# Patient Record
Sex: Female | Born: 1935 | Race: White | Hispanic: No | Smoking: Never smoker
Health system: Southern US, Community
[De-identification: ages and names within clinical notes are randomized; demographics above are authoritative.]

## PROBLEM LIST (undated history)

## (undated) DIAGNOSIS — M1990 Unspecified osteoarthritis, unspecified site: Secondary | ICD-10-CM

## (undated) DIAGNOSIS — E039 Hypothyroidism, unspecified: Secondary | ICD-10-CM

## (undated) DIAGNOSIS — N189 Chronic kidney disease, unspecified: Secondary | ICD-10-CM

## (undated) DIAGNOSIS — J189 Pneumonia, unspecified organism: Secondary | ICD-10-CM

## (undated) DIAGNOSIS — R112 Nausea with vomiting, unspecified: Secondary | ICD-10-CM

## (undated) DIAGNOSIS — E7800 Pure hypercholesterolemia, unspecified: Secondary | ICD-10-CM

## (undated) DIAGNOSIS — C801 Malignant (primary) neoplasm, unspecified: Secondary | ICD-10-CM

## (undated) DIAGNOSIS — I82409 Acute embolism and thrombosis of unspecified deep veins of unspecified lower extremity: Secondary | ICD-10-CM

## (undated) DIAGNOSIS — I10 Essential (primary) hypertension: Secondary | ICD-10-CM

## (undated) DIAGNOSIS — I739 Peripheral vascular disease, unspecified: Secondary | ICD-10-CM

## (undated) DIAGNOSIS — Z8489 Family history of other specified conditions: Secondary | ICD-10-CM

## (undated) DIAGNOSIS — C50911 Malignant neoplasm of unspecified site of right female breast: Secondary | ICD-10-CM

## (undated) DIAGNOSIS — R0602 Shortness of breath: Secondary | ICD-10-CM

## (undated) DIAGNOSIS — H3530 Unspecified macular degeneration: Secondary | ICD-10-CM

## (undated) DIAGNOSIS — Z9889 Other specified postprocedural states: Secondary | ICD-10-CM

## (undated) HISTORY — PX: FRACTURE SURGERY: SHX138

## (undated) HISTORY — PX: CYSTOSCOPY W/ LITHOLAPAXY / EHL: SUR377

## (undated) HISTORY — PX: WRIST FRACTURE SURGERY: SHX121

## (undated) HISTORY — PX: MASTECTOMY COMPLETE / SIMPLE: SUR845

## (undated) HISTORY — PX: FEMORAL ENDARTERECTOMY: SUR606

## (undated) HISTORY — PX: BREAST BIOPSY: SHX20

---

## 1987-10-11 HISTORY — PX: MASTECTOMY: SHX3

## 2010-01-10 ENCOUNTER — Emergency Department (HOSPITAL_COMMUNITY): Admission: EM | Admit: 2010-01-10 | Discharge: 2010-01-10 | Payer: Self-pay | Admitting: Family Medicine

## 2010-03-27 ENCOUNTER — Emergency Department: Payer: Self-pay | Admitting: Unknown Physician Specialty

## 2010-11-10 ENCOUNTER — Ambulatory Visit (HOSPITAL_COMMUNITY)
Admission: RE | Admit: 2010-11-10 | Discharge: 2010-11-10 | Disposition: A | Discharge status: Home / Self Care | Payer: No Typology Code available for payment source | Source: Ambulatory Visit | Attending: Urology | Admitting: Urology

## 2010-11-10 DIAGNOSIS — R509 Fever, unspecified: Secondary | ICD-10-CM | POA: Insufficient documentation

## 2010-11-10 DIAGNOSIS — N201 Calculus of ureter: Secondary | ICD-10-CM | POA: Insufficient documentation

## 2010-11-10 DIAGNOSIS — I10 Essential (primary) hypertension: Secondary | ICD-10-CM | POA: Insufficient documentation

## 2010-11-10 DIAGNOSIS — Z853 Personal history of malignant neoplasm of breast: Secondary | ICD-10-CM | POA: Insufficient documentation

## 2010-11-10 DIAGNOSIS — Z01810 Encounter for preprocedural cardiovascular examination: Secondary | ICD-10-CM | POA: Insufficient documentation

## 2010-11-10 DIAGNOSIS — E7800 Pure hypercholesterolemia, unspecified: Secondary | ICD-10-CM | POA: Insufficient documentation

## 2010-11-10 HISTORY — PX: CYSTOSCOPY W/ URETERAL STENT PLACEMENT: SHX1429

## 2010-11-10 LAB — SURGICAL PCR SCREEN: Staphylococcus aureus: NEGATIVE

## 2010-11-10 LAB — COMPREHENSIVE METABOLIC PANEL
CO2: 26 mEq/L (ref 19–32)
Calcium: 9.3 mg/dL (ref 8.4–10.5)
Creatinine, Ser: 1.06 mg/dL (ref 0.4–1.2)
GFR calc non Af Amer: 49 mL/min — ABNORMAL LOW (ref >60)
Glucose, Bld: 130 mg/dL — ABNORMAL HIGH (ref 70–99)

## 2010-11-10 LAB — CBC
HCT: 36.9 % (ref 36.0–46.0)
Hemoglobin: 12.4 g/dL (ref 12.0–15.0)
MCH: 30.8 pg (ref 26.0–34.0)
MCHC: 33.6 g/dL (ref 30.0–36.0)

## 2010-11-11 LAB — URINE CULTURE: Special Requests: NEGATIVE

## 2010-11-13 NOTE — Op Note (Signed)
  [name], [name]             ACCOUNT NO.:  [id number]  MEDICAL RECORD NO.:  [id number]           PATIENT TYPE:  O  LOCATION:  DAYL                         FACILITY:  [facility] [place]  PHYSICIAN:  Heloise Purpura, MD      DATE OF BIRTH:  Feb 06, 1919  DATE OF PROCEDURE:  11/10/2010 DATE OF DISCHARGE:                              OPERATIVE REPORT   PREOPERATIVE DIAGNOSIS: 1. Left ureteral stone. 2. Fever.  POSTOPERATIVE DIAGNOSIS: 1. Left ureteral stone. 2. Fever.  PROCEDURE: 1. Cystoscopy. 2. Left retrograde pyelography with interpretation. 3. Left ureteral stent placement (6 x 24).  SURGEON:  Heloise Purpura, MD  ANESTHESIA:  MAC.  INTRAOPERATIVE FINDINGS:  Retrograde pyelography demonstrated a filling defect within the distal left ureter consistent with the patient's stone and indicating that it had migrated distally compared to her prior CT scan.  No other abnormalities or filling defects were noted within the ureter.  INDICATIONS FOR PROCEDURE:  [name] is an 77 year old female who was found to have a distal left ureteral calculus and was evaluated by Dr. Annabell Howells approximately two days ago.  She had persistent pain and a fever to 100.8 degrees Fahrenheit and presented today for further evaluation.  Based on her continued pain as well as the fact that she did have a fever indicating possible infection, it was felt that she should proceed with cystoscopy and left ureteral stent placement.  The potential risks, complications and alternative treatment options were discussed in detail, and informed consent was obtained.  DESCRIPTION OF PROCEDURE:  The patient was taken to the operating room, and intravenous sedation was administered.  She was given preoperative antibiotics, placed in the dorsal lithotomy position and prepped and draped in the usual sterile fashion.  Next, a preoperative timeout was performed.  Cystourethroscopy was then performed which revealed no evidence of  any bladder tumors, stones or other mucosal pathology upon systematic examination of bladder.  The ureteral orifices were in the normal anatomic position, and the left ureteral orifice was then identified and cannulated with a 6-French ureteral catheter.  Omnipaque contrast was then gently injected, and there was a filling defect in the distal third of the ureter consistent with the patient's known stone. There was dilation above this area but no evidence of any other ureteral filling defects.  A 0.038 sensor guidewire was then advanced past the obstructing stone and into the renal pelvis without difficulty.  The ureteral catheter was then removed, and a 6 x 24 double J ureteral stent was advanced over the wire using Seldinger technique.  Once the stent was appropriately positioned under fluoroscopic and cystoscopic guidance, the wire was removed with a good curl noted in the renal pelvis as well as in the bladder.  The patient's bladder was emptied, and the procedure was ended.  She tolerated the procedure well and without complications.  She was able to betransferred to the recovery unit in satisfactory condition.     Heloise Purpura, MD     LB/MEDQ  D:  11/10/2010  T:  11/10/2010  Job:  [id number]  Electronically Signed by Heloise Purpura MD on 11/13/2010 06:33:49 AM

## 2010-11-22 ENCOUNTER — Ambulatory Visit (HOSPITAL_COMMUNITY)
Admission: RE | Admit: 2010-11-22 | Discharge: 2010-11-22 | Disposition: A | Discharge status: Home / Self Care | Payer: No Typology Code available for payment source | Source: Ambulatory Visit | Attending: Urology | Admitting: Urology

## 2010-11-22 DIAGNOSIS — E7800 Pure hypercholesterolemia, unspecified: Secondary | ICD-10-CM | POA: Insufficient documentation

## 2010-11-22 DIAGNOSIS — Z79899 Other long term (current) drug therapy: Secondary | ICD-10-CM | POA: Insufficient documentation

## 2010-11-22 DIAGNOSIS — I10 Essential (primary) hypertension: Secondary | ICD-10-CM | POA: Insufficient documentation

## 2010-11-22 DIAGNOSIS — R509 Fever, unspecified: Secondary | ICD-10-CM | POA: Insufficient documentation

## 2010-11-22 DIAGNOSIS — N201 Calculus of ureter: Secondary | ICD-10-CM | POA: Insufficient documentation

## 2010-11-22 DIAGNOSIS — N1330 Unspecified hydronephrosis: Secondary | ICD-10-CM | POA: Insufficient documentation

## 2010-11-22 HISTORY — PX: CYSTOSCOPY W/ URETERAL STENT REMOVAL: SHX1430

## 2010-11-24 NOTE — Op Note (Signed)
  [name], [name]             ACCOUNT NO.:  [id number]  MEDICAL RECORD NO.:  [id number]           PATIENT TYPE:  O  LOCATION:  DAYL                         FACILITY:  [facility]  PHYSICIAN:  Excell Seltzer. Annabell Howells, M.D.    DATE OF BIRTH:  03/05/1919  DATE OF PROCEDURE:  11/22/2010 DATE OF DISCHARGE:                              OPERATIVE REPORT   PATIENT OF:  Excell Seltzer. Annabell Howells, M.D.  PROCEDURE:  Cystoscopy, removal of left double-J stent,left ureteroscopic stone extraction.  PREOPERATIVE DIAGNOSIS:  Left distal ureteral stone with retained stent.  SURGEON:  Excell Seltzer. Annabell Howells, M.D.  ANESTHESIA:  General.  SPECIMENS:  Standard stone.  COMPLICATIONS:  None.  INDICATIONS:  [name] is an 76 year old white female with 4 x 8 mm left ureteral stone, who underwent stenting previously when she developed fever.  She is now on antibiotics and is tired of the stent which is causing pain and wants definitive treatment.  FINDINGS OF PROCEDURE:  She was taken to the Operating Room where she was given Cipro and was fitted with PAS hose.  A general anesthetic was induced.  She was placed in lithotomy position.  Her perineum and genitalia were prepped with Betadine solution.  She was draped in usual sterile fashion.  Cystoscopy was performed using a 22 [place] scope and a 12 degrees lens.  The stent was visualized, grasped with grasping forceps and pulled the urethral meatus where a wire was passed in the kidney under fluoroscopic guidance.  The stent was then removed.  A 6-French short ureteroscope was then inserted alongside the wire.  The stone was identified in the distal ureter and was grasped with zero-tip nitinol basket and removed intact without difficulty.  The ureteroscope was then reinserted.  The ureter was inspected.  There was minimal mucosal trauma, was felt as replacement stent was not indicated.  So, the bladder was drained with the cystoscope.  She was taken down from lithotomy  position.  Her anesthetic was reversed.  She was taken to the recovery room in stable condition.  Her stone was given to her family and they are to bring it to the office for analysis.  She has pain medicine antibiotic on hand which she will continue on.  She will follow up with me in 2-3 weeks.     Excell Seltzer. Annabell Howells, M.D.     JJW/MEDQ  D:  11/22/2010  T:  11/22/2010  Job:  [id number]  Electronically Signed by Bjorn Pippin M.D. on 11/24/2010 09:25:20 AM

## 2010-12-29 LAB — POCT URINALYSIS DIP (DEVICE)
Glucose, UA: NEGATIVE mg/dL
Nitrite: NEGATIVE
Specific Gravity, Urine: 1.025 (ref 1.005–1.030)

## 2012-01-17 ENCOUNTER — Ambulatory Visit (HOSPITAL_COMMUNITY): Payer: No Typology Code available for payment source | Admitting: Anesthesiology

## 2012-01-17 ENCOUNTER — Ambulatory Visit (HOSPITAL_COMMUNITY): Payer: No Typology Code available for payment source

## 2012-01-17 ENCOUNTER — Other Ambulatory Visit: Payer: Self-pay | Admitting: Urology

## 2012-01-17 ENCOUNTER — Inpatient Hospital Stay (HOSPITAL_COMMUNITY)
Admission: AD | Admit: 2012-01-17 | Discharge: 2012-01-18 | DRG: 694 | Disposition: A | Discharge status: Home / Self Care | Payer: No Typology Code available for payment source | Source: Ambulatory Visit | Attending: Urology | Admitting: Urology

## 2012-01-17 ENCOUNTER — Encounter (HOSPITAL_COMMUNITY): Payer: Self-pay | Admitting: Anesthesiology

## 2012-01-17 ENCOUNTER — Encounter (HOSPITAL_COMMUNITY)
Admission: AD | Disposition: A | Discharge status: Home / Self Care | Payer: Self-pay | Source: Ambulatory Visit | Attending: Urology

## 2012-01-17 ENCOUNTER — Encounter (HOSPITAL_COMMUNITY): Payer: Self-pay | Admitting: *Deleted

## 2012-01-17 DIAGNOSIS — I10 Essential (primary) hypertension: Secondary | ICD-10-CM | POA: Diagnosis present

## 2012-01-17 DIAGNOSIS — N201 Calculus of ureter: Principal | ICD-10-CM | POA: Diagnosis present

## 2012-01-17 DIAGNOSIS — E039 Hypothyroidism, unspecified: Secondary | ICD-10-CM | POA: Diagnosis present

## 2012-01-17 DIAGNOSIS — N390 Urinary tract infection, site not specified: Secondary | ICD-10-CM | POA: Diagnosis present

## 2012-01-17 HISTORY — DX: Shortness of breath: R06.02

## 2012-01-17 HISTORY — DX: Hypothyroidism, unspecified: E03.9

## 2012-01-17 HISTORY — DX: Nausea with vomiting, unspecified: R11.2

## 2012-01-17 HISTORY — PX: CYSTOSCOPY W/ URETERAL STENT PLACEMENT: SHX1429

## 2012-01-17 HISTORY — DX: Essential (primary) hypertension: I10

## 2012-01-17 HISTORY — DX: Chronic kidney disease, unspecified: N18.9

## 2012-01-17 HISTORY — DX: Other specified postprocedural states: Z98.890

## 2012-01-17 HISTORY — DX: Unspecified osteoarthritis, unspecified site: M19.90

## 2012-01-17 LAB — CBC
HCT: 37.6 % (ref 36.0–46.0)
RDW: 13.7 % (ref 11.5–15.5)
WBC: 10.3 10*3/uL (ref 4.0–10.5)

## 2012-01-17 LAB — BASIC METABOLIC PANEL
BUN: 16 mg/dL (ref 6–23)
Chloride: 107 mEq/L (ref 96–112)
GFR calc Af Amer: 57 mL/min — ABNORMAL LOW (ref >90)
Potassium: 3.9 mEq/L (ref 3.5–5.1)
Sodium: 142 mEq/L (ref 135–145)

## 2012-01-17 LAB — SURGICAL PCR SCREEN
MRSA, PCR: NEGATIVE
Staphylococcus aureus: NEGATIVE

## 2012-01-17 SURGERY — CYSTOSCOPY, WITH RETROGRADE PYELOGRAM AND URETERAL STENT INSERTION
Anesthesia: General | Site: Bladder | Laterality: Left | Wound class: Clean Contaminated

## 2012-01-17 MED ORDER — SODIUM CHLORIDE 0.9 % IR SOLN
Status: DC | PRN
  Administered 2012-01-17: 1000 mL

## 2012-01-17 MED ORDER — AMLODIPINE BESYLATE 5 MG PO TABS
5.0000 mg | ORAL_TABLET | Freq: Every day | ORAL | Status: DC
  Administered 2012-01-17 – 2012-01-18 (×2): 5 mg via ORAL
  Filled 2012-01-17 (×2): qty 1

## 2012-01-17 MED ORDER — FENTANYL CITRATE 0.05 MG/ML IJ SOLN: 25.0000 ug | INTRAMUSCULAR | Status: DC | PRN

## 2012-01-17 MED ORDER — ONDANSETRON HCL 4 MG/2ML IJ SOLN: 4.0000 mg | INTRAMUSCULAR | Status: DC | PRN

## 2012-01-17 MED ORDER — CIPROFLOXACIN IN D5W 400 MG/200ML IV SOLN
INTRAVENOUS | Status: DC | PRN
  Administered 2012-01-17: 400 mg via INTRAVENOUS

## 2012-01-17 MED ORDER — LACTATED RINGERS IV SOLN
INTRAVENOUS | Status: DC | PRN
  Administered 2012-01-17: 17:00:00 via INTRAVENOUS

## 2012-01-17 MED ORDER — ACETAMINOPHEN 10 MG/ML IV SOLN
1000.0000 mg | Freq: Four times a day (QID) | INTRAVENOUS | Status: DC
  Administered 2012-01-17 – 2012-01-18 (×3): 1000 mg via INTRAVENOUS
  Filled 2012-01-17 (×4): qty 100

## 2012-01-17 MED ORDER — IOHEXOL 300 MG/ML  SOLN
INTRAMUSCULAR | Status: AC
  Filled 2012-01-17: qty 1

## 2012-01-17 MED ORDER — PROPOFOL 10 MG/ML IV BOLUS
INTRAVENOUS | Status: DC | PRN
  Administered 2012-01-17: 150 mg via INTRAVENOUS

## 2012-01-17 MED ORDER — CIPROFLOXACIN IN D5W 400 MG/200ML IV SOLN
400.0000 mg | Freq: Two times a day (BID) | INTRAVENOUS | Status: DC
  Administered 2012-01-18: 400 mg via INTRAVENOUS
  Filled 2012-01-17 (×3): qty 200

## 2012-01-17 MED ORDER — FENTANYL CITRATE 0.05 MG/ML IJ SOLN
INTRAMUSCULAR | Status: DC | PRN
  Administered 2012-01-17 (×2): 50 ug via INTRAVENOUS

## 2012-01-17 MED ORDER — LOSARTAN POTASSIUM 50 MG PO TABS
50.0000 mg | ORAL_TABLET | Freq: Every day | ORAL | Status: DC
  Administered 2012-01-17 – 2012-01-18 (×2): 50 mg via ORAL
  Filled 2012-01-17 (×2): qty 1

## 2012-01-17 MED ORDER — LACTATED RINGERS IV SOLN: INTRAVENOUS | Status: DC

## 2012-01-17 MED ORDER — LIDOCAINE HCL (CARDIAC) 20 MG/ML IV SOLN
INTRAVENOUS | Status: DC | PRN
  Administered 2012-01-17: 50 mg via INTRAVENOUS

## 2012-01-17 MED ORDER — DOCUSATE SODIUM 100 MG PO CAPS
100.0000 mg | ORAL_CAPSULE | Freq: Two times a day (BID) | ORAL | Status: DC
  Administered 2012-01-17 – 2012-01-18 (×2): 100 mg via ORAL
  Filled 2012-01-17 (×3): qty 1

## 2012-01-17 MED ORDER — OXYBUTYNIN CHLORIDE 5 MG PO TABS
5.0000 mg | ORAL_TABLET | Freq: Three times a day (TID) | ORAL | Status: DC | PRN
  Filled 2012-01-17: qty 1

## 2012-01-17 MED ORDER — LEVOTHYROXINE SODIUM 50 MCG PO TABS
50.0000 ug | ORAL_TABLET | Freq: Every day | ORAL | Status: DC
  Administered 2012-01-17 – 2012-01-18 (×2): 50 ug via ORAL
  Filled 2012-01-17 (×2): qty 1

## 2012-01-17 MED ORDER — DIATRIZOATE MEGLUMINE 30 % UR SOLN
URETHRAL | Status: DC | PRN
  Administered 2012-01-17: 2 mL

## 2012-01-17 MED ORDER — CIPROFLOXACIN IN D5W 400 MG/200ML IV SOLN
INTRAVENOUS | Status: AC
  Filled 2012-01-17: qty 200

## 2012-01-17 MED ORDER — DEXTROSE-NACL 5-0.45 % IV SOLN
INTRAVENOUS | Status: DC
  Administered 2012-01-17: 20:00:00 via INTRAVENOUS

## 2012-01-17 MED ORDER — MUPIROCIN 2 % EX OINT
TOPICAL_OINTMENT | CUTANEOUS | Status: AC
  Filled 2012-01-17: qty 22

## 2012-01-17 MED ORDER — ONDANSETRON HCL 4 MG/2ML IJ SOLN
INTRAMUSCULAR | Status: DC | PRN
  Administered 2012-01-17: 4 mg via INTRAVENOUS

## 2012-01-17 SURGICAL SUPPLY — 13 items
BAG URO CATCHER STRL LF (DRAPE) ×3 IMPLANT
CATH URET 5FR 28IN OPEN ENDED (CATHETERS) ×3 IMPLANT
CLOTH BEACON ORANGE TIMEOUT ST (SAFETY) ×3 IMPLANT
DRAPE CAMERA CLOSED 9X96 (DRAPES) ×3 IMPLANT
GLOVE SURG SS PI 8.0 STRL IVOR (GLOVE) ×3 IMPLANT
GOWN PREVENTION PLUS XLARGE (GOWN DISPOSABLE) ×3 IMPLANT
GOWN STRL REIN XL XLG (GOWN DISPOSABLE) ×3 IMPLANT
GUIDEWIRE STR DUAL SENSOR (WIRE) ×3 IMPLANT
MANIFOLD NEPTUNE II (INSTRUMENTS) ×3 IMPLANT
MARKER SKIN DUAL TIP RULER LAB (MISCELLANEOUS) IMPLANT
PACK CYSTO (CUSTOM PROCEDURE TRAY) ×3 IMPLANT
STENT POLARIS 5FRX24 (STENTS) ×3 IMPLANT
TUBING CONNECTING 10 (TUBING) ×3 IMPLANT

## 2012-01-17 NOTE — Anesthesia Postprocedure Evaluation (Signed)
  Anesthesia Post-op Note  Patient: [name]  Procedure(s) Performed: Procedure(s) (LRB): CYSTOSCOPY WITH RETROGRADE PYELOGRAM/URETERAL STENT PLACEMENT (Left)  Patient Location: PACU  Anesthesia Type: General  Level of Consciousness: awake and alert   Airway and Oxygen Therapy: Patient Spontanous Breathing  Post-op Pain: mild  Post-op Assessment: Post-op Vital signs reviewed, Patient's Cardiovascular Status Stable, Respiratory Function Stable, Patent Airway and No signs of Nausea or vomiting  Post-op Vital Signs: stable  Complications: No apparent anesthesia complications

## 2012-01-17 NOTE — H&P (Signed)
Urology Admission H&P  Chief Complaint:Left sided abdominal pain, fever and chills  History of Present Illness:        78 y/o white female with h/o nephrolithiasis presented to our office earlier today for acute evaluation of LLQ abdominal pain and dysuria.  She reports having left flank pain intermittently for the past 3-4 days.  Within the past 24hrs the pain has moved to the LLQ abdomen.  The pain is constant but intermittently sharp. The pain is unchanged with activities or positioning.  She has been taking Advil for pain which is mildly helpful. She has not had much appetite so she is unsure if eating aggrevates this.  She has been constipated but denies diarrhea.  She has had temp of 101F, chills and flu-like sxs for the past 3-4 days. She denies gross hematuria.  She has dysuria which she describes as a drawing sensation with voiding.  She denies burning.  She does have a h/o diverticulosis.  She had to have urgent stent placement due to obstructing mid left ureteral stone in 11/10/2010.  She did not tolerate the stent well and ended up having left ureteroscopy with stone extraction 11/22/10. She was found to have an obstructing proximal left ureteral stone. She presents at this time for cystoscopy and urgent stent placement due her fever and probable UTI.  Past Medical History  Diagnosis Date  . Hypertension   . Shortness of breath   . Hypothyroidism   . kidney calculi   . Arthritis   . PONV (postoperative nausea and vomiting)    Past Surgical History  Procedure Date  . Breast surgery   . Cystoscopy w/ ureteral stent placement   . Eye surgery     Home Medications:  Prescriptions prior to admission  Medication Sig Dispense Refill  . amLODipine (NORVASC) 5 MG tablet Take 5 mg by mouth daily.      Marland Kitchen levothyroxine (SYNTHROID, LEVOTHROID) 50 MCG tablet Take 50 mcg by mouth daily.      Marland Kitchen losartan (COZAAR) 50 MG tablet Take 50 mg by mouth daily.      . meloxicam (MOBIC) 7.5 MG tablet Take  7.5 mg by mouth daily as needed. Pain and inflammation       Allergies:  Allergies  Allergen Reactions  . Codeine Nausea And Vomiting    History reviewed. No pertinent family history. Social History:  reports that she has never smoked. She has never used smokeless tobacco. She reports that she does not drink alcohol or use illicit drugs.  Review of Systems  Cardiovascular: Leg swelling: Urol.   Genitourinary, constitutional, skin, eye, otolaryngeal, hematologic/lymphatic, cardiovascular, pulmonary, endocrine, musculoskeletal, gastrointestinal, neurological and psychiatric system(s) were reviewed and pertinent findings if present are noted.  Genitourinary: dysuria.  Gastrointestinal: flank pain and abdominal pain.     Physical Exam:  Vital signs in last 24 hours: Temp:  [97.7 F (36.5 C)] 97.7 F (36.5 C) (04/09 1541) Pulse Rate:  [100] 100  (04/09 1541) Resp:  [20] 20  (04/09 1541) BP: (179)/(77) 179/77 mmHg (04/09 1541) SpO2:  [94 %] 94 % (04/09 1541) Weight:  [74.106 kg (163 lb 6 oz)] 74.106 kg (163 lb 6 oz) (04/09 1541) Physical Exam Constitutional: Well nourished and well developed . No acute distress.  ENT:. The ears and nose are normal in appearance.  Neck: The appearance of the neck is normal and no neck mass is present.  Pulmonary: No respiratory distress and normal respiratory rhythm and effort.  Cardiovascular:. No peripheral  edema.  Abdomen: mild left CVA tenderness.  Skin: Normal skin turgor, no visible rash and no visible skin lesions.  Neuro/Psych:. Mood and affect are appropriate.     Laboratory Data:  Results for orders placed during the hospital encounter of 01/17/12 (from the past 24 hour(s))  CBC     Status: Normal   Collection Time   01/17/12  4:10 PM      Component Value Range   WBC 10.3  4.0 - 10.5 (K/uL)   RBC 4.04  3.87 - 5.11 (MIL/uL)   Hemoglobin 12.2  12.0 - 15.0 (g/dL)   HCT 16.1  09.6 - 04.5 (%)   MCV 93.1  78.0 - 100.0 (fL)   MCH 30.2   26.0 - 34.0 (pg)   MCHC 32.4  30.0 - 36.0 (g/dL)   RDW 40.9  81.1 - 91.4 (%)   Platelets 275  150 - 400 (K/uL)  I have reviewed the patient's KUB. On her CT scan in early 2012. There was a mid ureteral stone the left, and a 7 mm left lower pole stone. It seems like the 7 mm stone is now approximating the left vertebral body. No results found for this or any previous visit (from the past 240 hour(s)). Creatinine: No results found for this basename: CREATININE:7 in the last 168 hours Baseline Creatinine:  Impression/Assessment:  Probable left mid ureteral calculus with abdominal pain and fever  Plan:  Anesthetic cystoscopy, left retrograde, possible stent placement.  Chelsea Aus 01/17/2012, 4:55 PM

## 2012-01-17 NOTE — Transfer of Care (Signed)
Immediate Anesthesia Transfer of Care Note  Patient: [name]  Procedure(s) Performed: Procedure(s) (LRB): CYSTOSCOPY WITH RETROGRADE PYELOGRAM/URETERAL STENT PLACEMENT (Left)  Patient Location: PACU  Anesthesia Type: General  Level of Consciousness: awake, alert  and patient cooperative  Airway & Oxygen Therapy: Patient Spontanous Breathing and Patient connected to face mask oxygen  Post-op Assessment: Report given to PACU RN and Post -op Vital signs reviewed and stable  Post vital signs: Reviewed and stable  Complications: No apparent anesthesia complications

## 2012-01-17 NOTE — Anesthesia Preprocedure Evaluation (Addendum)
Anesthesia Evaluation  Patient identified by MRN, date of birth, ID band Patient awake  General Assessment Comment:Sinus tachycardia at 112 with some premature beats. Room air oxygen sat is 96%  Reviewed: Allergy & Precautions, H&P , NPO status , Patient's Chart, lab work & pertinent test results  History of Anesthesia Complications (+) PONV  Airway Mallampati: II TM Distance: >3 FB Neck ROM: Full    Dental No notable dental hx.    Pulmonary shortness of breath,  breath sounds clear to auscultation  Pulmonary exam normal       Cardiovascular hypertension, Pt. on medications Rhythm:Regular Rate:Normal     Neuro/Psych Tremor, central and peripheral negative psych ROS   GI/Hepatic negative GI ROS, Neg liver ROS,   Endo/Other  Hypothyroidism   Renal/GU negative Renal ROS  negative genitourinary   Musculoskeletal negative musculoskeletal ROS (+)   Abdominal   Peds negative pediatric ROS (+)  Hematology negative hematology ROS (+)   Anesthesia Other Findings   Reproductive/Obstetrics negative OB ROS                         Anesthesia Physical Anesthesia Plan  ASA: III  Anesthesia Plan: General   Post-op Pain Management:    Induction: Intravenous  Airway Management Planned: LMA  Additional Equipment:   Intra-op Plan:   Post-operative Plan: Extubation in OR  Informed Consent: I have reviewed the patients History and Physical, chart, labs and discussed the procedure including the risks, benefits and alternatives for the proposed anesthesia with the patient or authorized representative who has indicated his/her understanding and acceptance.   Dental advisory given  Plan Discussed with: CRNA  Anesthesia Plan Comments: (NPO for food since 8 AM. NPO for fluids since 11 AM)       Anesthesia Quick Evaluation

## 2012-01-17 NOTE — Op Note (Signed)
Preoperative diagnosis: Left proximal ureteral stone with probable UTI  Postoperative diagnosis same  Procedure: Cystoscopy, left retrograde ureteropyelogram, left double J stent placement (24 cm x 5 French Polaris without string)  Surgeon: Retta Diones  Anesthesia: Gen. with LMA  Complications: None  Indications for procedure: 78 year-old female with urolithiasis, with fever x4 days with left flank pain. She was found to have approximately ureteral stone and presents for proper drainage with cystoscopy and stent placement, with eventual treatment of her stone down the road following treatment of her infection.  Description of procedure: The patient received preoperative IV Cipro, and was taken to the operating room after proper patient identification and marking. He was placed in the dorsolithotomy position after general anesthetic was administered with the LMA. Genitalia and perineum were prepped and draped. Timeout was then called.  The procedure then commenced. A 22 French panendoscope was advanced into her bladder which appeared normal. Urine was sent for culture. The left ureteral orifice was cannulated with a 5 French open-end catheter, and gentle retrograde was performed.  Retrograde revealed a normal distal ureter with a filling defect consistent with the proximal ureteral stone with proximal hydronephrosis. I did not apply significant pressure for the retrograde to decrease her risk of sepsis. A guidewire was then advanced through the open-ended catheter, and into the left renal pelvis. Over top of the guidewire, a 24 cm x 5 [place] Paul s stent was placed. Good proximal curl was seen fluoroscopically. Purulent urine was obtained from the left ureteral orifice at that point. The bladder was drained and the procedure terminated.  The patient was awakened and taken to PACU in stable condition.

## 2012-01-18 LAB — URINE CULTURE: Culture  Setup Time: 201304100153

## 2012-01-18 MED ORDER — CIPROFLOXACIN HCL 250 MG PO TABS: 250.0000 mg | ORAL_TABLET | Freq: Two times a day (BID) | ORAL | Status: AC

## 2012-01-18 MED ORDER — ZOLPIDEM TARTRATE 5 MG PO TABS
5.0000 mg | ORAL_TABLET | Freq: Every evening | ORAL | Status: DC | PRN
  Administered 2012-01-18: 5 mg via ORAL
  Filled 2012-01-18: qty 1

## 2012-01-18 MED ORDER — OXYBUTYNIN CHLORIDE 5 MG PO TABS: 5.0000 mg | ORAL_TABLET | Freq: Three times a day (TID) | ORAL | Status: DC | PRN

## 2012-01-18 NOTE — Discharge Summary (Signed)
Physician Discharge Summary  Patient ID: [name] MRN: [id number] DOB/AGE: Feb 27, 1919 78 y.o.  Admit date: 01/17/2012 Discharge date: 01/18/2012  Admission Diagnoses:  Discharge Diagnoses:  Active Problems:  * No active hospital problems. *    Discharged Condition: good  Hospital Course: The patient was admitted for urgent stent placement for fever and left ureteral stone. She tolerated the procedure well, which was performed on 09/17/2012. She had an uncomplicated postoperative course, and was discharged on postoperative day 1.  Consults: None  Significant Diagnostic Studies: microbiology: urine culture: Pending  Treatments: surgery: Cystoscopy and left double-J stent placement  Discharge Exam: Blood pressure 132/53, pulse 80, temperature 98 F (36.7 C), temperature source Oral, resp. rate 18, height 5\' 4"  (1.626 m), weight 72.8 kg (160 lb 7.9 oz), SpO2 100.00%. General appearance: alert  Disposition: 01-Home or Self Care   Medication List  As of 01/18/2012 12:35 PM   TAKE these medications         amLODipine 5 MG tablet   Commonly known as: NORVASC   Take 5 mg by mouth daily.      ciprofloxacin 250 MG tablet   Commonly known as: CIPRO   Take 1 tablet (250 mg total) by mouth 2 (two) times daily.      levothyroxine 50 MCG tablet   Commonly known as: SYNTHROID, LEVOTHROID   Take 50 mcg by mouth daily.      losartan 50 MG tablet   Commonly known as: COZAAR   Take 50 mg by mouth daily.      meloxicam 7.5 MG tablet   Commonly known as: MOBIC   Take 7.5 mg by mouth daily as needed. Pain and inflammation      oxybutynin 5 MG tablet   Commonly known as: DITROPAN   Take 1 tablet (5 mg total) by mouth every 8 (eight) hours as needed.           Follow-up Information    Follow up with Nikkia Devoss, Bertram Millard, MD. (We will set up appointment)    Contact information:   [address] [place] [address] [phone]           Signed: Chelsea Aus 01/18/2012, 12:35 PM

## 2012-01-18 NOTE — Discharge Instructions (Signed)
1. You may see some blood in the urine and may have some burning with urination for 48-72 hours. You also may notice that you have to urinate more frequently or urgently after your procedure which is normal.  2. You should call should you develop an inability urinate, fever > 101, persistent nausea and vomiting that prevents you from eating or drinking to stay hydrated.  3. If you have a stent, you will likely urinate more frequently and urgently until the stent is removed and you may experience some discomfort/pain in the lower abdomen and flank especially when urinating. You may take pain medication prescribed to you if needed for pain. You may also intermittently have blood in the urine until the stent is removed. 4. If you have a catheter, you will be taught how to take care of the catheter by the nursing staff prior to discharge from the hospital.  You may periodically feel a strong urge to void with the catheter in place.  This is a bladder spasm and most often can occur when having a bowel movement or moving around. It is typically self-limited and usually will stop after a few minutes.  You may use some Vaseline or Neosporin around the tip of the catheter to reduce friction at the tip of the penis. You may also see some blood in the urine.  A very small amount of blood can make the urine look quite red.  As long as the catheter is draining well, there usually is not a problem.  However, if the catheter is not draining well and is bloody, you should call the office [phone]) to notify us.  5.  6. Prescription for Cipro and oxybutynin will be sent to your pharmacy.

## 2012-01-19 NOTE — Progress Notes (Signed)
Discharge summary sent to payer through [employer]  

## 2012-01-30 ENCOUNTER — Other Ambulatory Visit: Payer: Self-pay | Admitting: Urology

## 2012-02-01 ENCOUNTER — Encounter (HOSPITAL_COMMUNITY): Payer: Self-pay | Admitting: Urology

## 2012-02-01 ENCOUNTER — Encounter (HOSPITAL_COMMUNITY): Payer: Self-pay | Admitting: Pharmacy Technician

## 2012-02-01 NOTE — Progress Notes (Signed)
Take laxative around 1700 day before procedure Do not wear a belt  Bring blue folder with papers filled out Bring insurance card    Pre Litho instructions given to patient and verbalized understanding:  Take  Laxative as directed No aspirin ,ibubrofen, advil toradol 72 hours prior to lithotripsy review list in blue folder   

## 2012-02-06 ENCOUNTER — Encounter (HOSPITAL_COMMUNITY): Payer: Self-pay | Admitting: *Deleted

## 2012-02-06 ENCOUNTER — Ambulatory Visit (HOSPITAL_COMMUNITY)
Admission: RE | Admit: 2012-02-06 | Discharge: 2012-02-06 | Disposition: A | Discharge status: Home / Self Care | Payer: No Typology Code available for payment source | Source: Ambulatory Visit | Attending: Urology | Admitting: Urology

## 2012-02-06 ENCOUNTER — Encounter (HOSPITAL_COMMUNITY)
Admission: RE | Disposition: A | Discharge status: Home / Self Care | Payer: Self-pay | Source: Ambulatory Visit | Attending: Urology

## 2012-02-06 ENCOUNTER — Ambulatory Visit (HOSPITAL_COMMUNITY): Payer: No Typology Code available for payment source

## 2012-02-06 DIAGNOSIS — I10 Essential (primary) hypertension: Secondary | ICD-10-CM | POA: Insufficient documentation

## 2012-02-06 DIAGNOSIS — E7800 Pure hypercholesterolemia, unspecified: Secondary | ICD-10-CM | POA: Insufficient documentation

## 2012-02-06 DIAGNOSIS — Z853 Personal history of malignant neoplasm of breast: Secondary | ICD-10-CM | POA: Insufficient documentation

## 2012-02-06 DIAGNOSIS — N201 Calculus of ureter: Secondary | ICD-10-CM | POA: Insufficient documentation

## 2012-02-06 HISTORY — DX: Malignant (primary) neoplasm, unspecified: C80.1

## 2012-02-06 SURGERY — LITHOTRIPSY, ESWL
Anesthesia: LOCAL | Laterality: Left

## 2012-02-06 MED ORDER — SODIUM CHLORIDE 0.9 % IJ SOLN: 3.0000 mL | Freq: Two times a day (BID) | INTRAMUSCULAR | Status: DC

## 2012-02-06 MED ORDER — SODIUM CHLORIDE 0.9 % IV SOLN: 250.0000 mL | INTRAVENOUS | Status: DC | PRN

## 2012-02-06 MED ORDER — OXYCODONE-ACETAMINOPHEN 5-325 MG PO TABS: 1.0000 | ORAL_TABLET | Freq: Four times a day (QID) | ORAL | Status: AC | PRN

## 2012-02-06 MED ORDER — ONDANSETRON HCL 4 MG/2ML IJ SOLN: 4.0000 mg | Freq: Four times a day (QID) | INTRAMUSCULAR | Status: DC | PRN

## 2012-02-06 MED ORDER — CEFAZOLIN SODIUM 1-5 GM-% IV SOLN: 1.0000 g | INTRAVENOUS | Status: DC

## 2012-02-06 MED ORDER — OXYCODONE HCL 5 MG PO TABS: 5.0000 mg | ORAL_TABLET | ORAL | Status: DC | PRN

## 2012-02-06 MED ORDER — SODIUM CHLORIDE 0.9 % IJ SOLN: 3.0000 mL | INTRAMUSCULAR | Status: DC | PRN

## 2012-02-06 MED ORDER — DEXTROSE-NACL 5-0.45 % IV SOLN
INTRAVENOUS | Status: DC
  Administered 2012-02-06: 1000 mL via INTRAVENOUS

## 2012-02-06 MED ORDER — ACETAMINOPHEN 650 MG RE SUPP
650.0000 mg | RECTAL | Status: DC | PRN
  Filled 2012-02-06: qty 1

## 2012-02-06 MED ORDER — FENTANYL CITRATE 0.05 MG/ML IJ SOLN: 25.0000 ug | INTRAMUSCULAR | Status: DC | PRN

## 2012-02-06 MED ORDER — CEFAZOLIN SODIUM 1-5 GM-% IV SOLN
1.0000 g | Freq: Three times a day (TID) | INTRAVENOUS | Status: DC
  Administered 2012-02-06: 1 g via INTRAVENOUS
  Filled 2012-02-06 (×6): qty 50

## 2012-02-06 MED ORDER — ACETAMINOPHEN 325 MG PO TABS: 650.0000 mg | ORAL_TABLET | ORAL | Status: DC | PRN

## 2012-02-06 MED ORDER — TRAMADOL HCL 50 MG PO TABS: 50.0000 mg | ORAL_TABLET | Freq: Four times a day (QID) | ORAL | Status: AC | PRN

## 2012-02-06 MED ORDER — DIPHENHYDRAMINE HCL 25 MG PO CAPS
25.0000 mg | ORAL_CAPSULE | ORAL | Status: AC
  Administered 2012-02-06: 25 mg via ORAL

## 2012-02-06 MED ORDER — DIAZEPAM 5 MG PO TABS
10.0000 mg | ORAL_TABLET | ORAL | Status: AC
  Administered 2012-02-06: 10 mg via ORAL

## 2012-02-06 NOTE — Discharge Instructions (Signed)
Lithotripsy for Kidney Stones  WHAT ARE KIDNEY STONES?  The kidneys filter blood for chemicals the body cannot use. These waste chemicals are eliminated in the urine. They are removed from the body. Under some conditions, these chemicals may become concentrated. When this happens, they form crystals in the urine. When these crystals build up and stick together, stones may form. When these stones block the flow of urine through the urinary tract, they may cause severe pain. The urinary tract is very sensitive to blockage and stretching by the stone.  WHAT IS LITHOTRIPSY?  Lithotripsy is a treatment that can sometimes help eliminate kidney stones and pain faster. A form of lithotripsy, also known as ESWL (extracorporeal shock wave lithotripsy), is a nonsurgical procedure that helps your body rid itself of the kidney stone with a minimum amount of pain. EWSL is a method of crushing a kidney stone with shock waves. These shock waves pass through your body. They cause the kidney stones to crumble while still in the urinary tract. It is then easier for the smaller pieces of stone to pass in the urine.  Lithotripsy usually takes about an hour. It is done in a hospital, a lithotripsy center, or a mobile unit. It usually does not require an overnight stay. Your caregiver will instruct you on preparation for the procedure. Your caregiver will tell you what to expect afterward.  LET YOUR CAREGIVER KNOW ABOUT:   Allergies.    Medicines taken including herbs, eye drops, over the counter medicines (including aspirin, aleve, or motrin for treatment of inflammatory conditions) and creams.    Use of steroids (by mouth or creams).    Previous problems with anesthetics or novocaine.    Possibility of pregnancy, if this applies.    History of blood clots (thrombophlebitis).    History of bleeding or blood problems.    Previous surgery.    Other health problems.   RISKS AND COMPLICATIONS   Complications of lithotripsy are uncommon, but include the following:   Infection.    Bleeding of the kidney.    Bruising of the kidney or skin.    Obstruction of the ureter (the passageway from the kidney to the bladder).    Failure of the stone to fragment (break apart).   PROCEDURE  A stent (flexible tube with holes) may be placed in your ureter. The ureter is the tube that transports the urine from the kidneys to the bladder. Your caregiver may place a stent before the procedure. This will help keep urine flowing from the kidney if the fragments of the stone block the ureter. You may receive an intravenous (IV) line to give you fluids and medicines. These medicines may help you relax or make you sleep. During the procedure, you will lie comfortably on a fluid-filled cushion or in a warm-water bath. After an x-ray or ultrasound locates your stone, shock waves are aimed at the stone. If you are awake, you may feel a tapping sensation (feeling) as the shock waves pass through your body. If large stone particles remain after treatment, a second procedure may be necessary at a later date.  For comfort during the test:   Relax as much as possible.    Try to remain still as much as possible.    Try to follow instructions to speed up the test.    Let your caregiver know if you are uncomfortable, anxious, or in pain.   AFTER THE PROCEDURE    After surgery, you will be   taken to the recovery area. A nurse will watch and check your progress. Once you're awake, stable, and taking fluids well, you will be allowed to go home as long as there are no problems. You may be prescribed antibiotics (medicines that kill germs) to help prevent infection. You may also be prescribed pain medicine if needed. In a week or two, your doctor may remove your stent, if you have one. Your caregiver will check to see whether or not stone particles remain.  PASSING THE STONE   It may take anywhere from a day to several weeks for the stone particles to leave your body. During this time, drink at least 8 to 12 eight ounce glasses of water every day. It is normal for your urine to be cloudy or slightly bloody for a few weeks following this procedure. You may even see small pieces of stone in your urine. A slight fever and some pain are also normal. Your caregiver may ask you to strain your urine to collect some stone particles for chemical analysis. If you find particles while straining the urine, save them. Analysis tells you and the caregiver what the stone is made of. Knowing this may help prevent future stones.  PREVENTING FUTURE STONES   Drink about 8 to 12, eight-ounce glasses of water every day.    Follow the diet your caregiver recommends.    Take your prescribed medicine.    See your caregiver regularly for checkups.   SEEK IMMEDIATE MEDICAL CARE IF:   You develop an oral temperature above 102 F (38.9 C), or as your caregiver suggests.    Your pain is not relieved by medicine.    You develop nausea (feeling sick to your stomach) and vomiting.    You develop heavy bleeding.    You have difficulty urinating.   Document Released: 09/23/2000 Document Revised: 09/15/2011 Document Reviewed: 07/18/2008  [employer] Patient Information 2012 [employer].

## 2012-02-06 NOTE — H&P (Signed)
Problems Problems  1. Diffuse Abdominal Pain 789.07 2. Dysuria 788.1 3. Nephrolithiasis Of The Left Kidney 592.0 4. Oral Thrush 112.0 5. Pyuria 791.9 6. Ureteral Stone Left 592.1 7. Urge Incontinence Of Urine 788.31 8. Urinary Tract Infection 599.0  History of Present Illness  [name] returns today in f/u.  She has a left proximal ureteral stone that is about 9x31mm and had a stent placed about a week and a half ago.  She has minimal voiding symptoms and no flank pain or fever since being stented.  She has completed her antibiotic and the urine has minimal pyuria today.  She is on for ESWL on Monday.   Past Medical History Problems  1. History of  Arthritis V13.4 2. History of  Breast Cancer V10.3 3. History of  Hydronephrosis 591 4. History of  Hypercholesterolemia 272.0 5. History of  Hypertension 401.9 6. History of  Nephrolithiasis V13.01 7. Ureteral Stone Left 592.1  Surgical History Problems  1. History of  Breast Surgery Mastectomy Right V45.71 2. History of  Cataract Surgery Bilateral 3. History of  Cystoscopy With Insertion Of Ureteral Stent Left 4. History of  Cystoscopy With Insertion Of Ureteral Stent Left 5. History of  Cystoscopy With Removal Of Object 6. History of  Cystoscopy With Ureteroscopy With Removal Of Calculus  Current Meds 1. Alrex 0.2 % Ophthalmic Suspension; Therapy: 09Aug2012 to 2. AmLODIPine Besylate TABS; Therapy: (Recorded:23Apr2009) to 3. Fish Oil CAPS; Therapy: (Recorded:23Apr2009) to 4. Levothyroxine Sodium 50 MCG Oral Tablet; Therapy: (Recorded:23Apr2009) to 5. Losartan Potassium 50 MG Oral Tablet; Therapy: 29Aug2011 to 6. Meloxicam 7.5 MG Oral Tablet; Therapy: (Recorded:23Apr2009) to 7. Restasis 0.05 % Ophthalmic Emulsion; Therapy: 09Aug2012 to 8. Vitamin D TABS; Therapy: (Recorded:23Apr2009) to 9. Zyrtec 10 MG TABS; Therapy: (Recorded:23Apr2009) to  Allergies Medication  1. Codeine Derivatives 2. Codeine Sulfate TABS  Family  History Problems  1. Family history of  Death In The Family Father Deceased at age 5 2. Family history of  Death In The Family Mother Deceased at age 78; Stroke, Diabetes 3. Family history of  Family Health Status Number Of Children 2 Sons & 6 Daughters  Social History Problems  1. Caffeine Use 2 2. Marital History - Widowed 3. 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 History of  Alcohol Use 6. History of  Tobacco Use V15.82  Review of Systems  Genitourinary: dysuria, but no hematuria.  Gastrointestinal: constipation, but no flank pain and no diarrhea.  Constitutional: no fever.  Cardiovascular: no chest pain.  Respiratory: no shortness of breath.    Vitals Vital Signs [Data Includes: Last 1 Day]  25Apr2013 01:48PM  Blood Pressure: 158 / 67 Heart Rate: 85 25Apr2013 01:46PM  Temperature: 97 F  Physical Exam Constitutional: Well nourished and well developed . No acute distress.  Pulmonary: No respiratory distress and normal respiratory rhythm and effort.  Cardiovascular: Heart rate and rhythm are normal . No peripheral edema.    Results/Data Urine [Data Includes: Last 1 Day]   25Apr2013  COLOR YELLOW   APPEARANCE CLOUDY   SPECIFIC GRAVITY 1.025   pH 6.0   GLUCOSE NEG mg/dL  BILIRUBIN NEG   KETONE NEG mg/dL  BLOOD LARGE   PROTEIN 100 mg/dL  UROBILINOGEN 0.2 mg/dL  NITRITE NEG   LEUKOCYTE ESTERASE SMALL   SQUAMOUS EPITHELIAL/HPF RARE   WBC 0-3 WBC/hpf  RBC NONE SEEN RBC/hpf  BACTERIA RARE   CRYSTALS NONE SEEN   CASTS NONE SEEN    The following images/tracing/specimen were independently  visualized:  KUB today shows the stone at about L4-5 on the left adjacent to the stent which is only up into the proximal ureter.    Assessment Assessed  1. Ureteral Stone Left 592.1 2. Urinary Tract Infection 599.0   She is doing well with the stone and stent.   Plan Pyuria (791.9)  1. URINE CULTURE  Done: 25Apr2013 Ureteral Stone (592.1), Urinary  Tract Infection (599.0)  2. Ciprofloxacin HCl 250 MG Oral Tablet; TAKE 1 TABLET BID; Therapy: 25Apr2013 to  (Complete:02May2013)  Requested for: 25Apr2013; Last Rx:25Apr2013; Edited Urinary Tract Infection (599.0)  3. Follow-up Office  Follow-up  Requested for: 08May2013 4. Cysto Removal JJ(s)  Requested for: 08May2013   She is on for Monday for ESWL and I reviewed the risks in detail. I am going to put her back on Cipro through the weekend to make sure her UTI doesn't recur in the interim. I will have her return a week or so post treatment for stent removal.

## 2012-02-06 NOTE — Progress Notes (Signed)
Per dr Annabell Howells  Phone call  He has ordered ancef iv and wants to be sure it is done  And wants to discontinue cipro po This is done

## 2012-02-06 NOTE — Interval H&P Note (Signed)
History and Physical Interval Note:  02/06/2012 4:26 PM  [name]  has presented today for surgery, with the diagnosis of left ureteral stone   The various methods of treatment have been discussed with the patient and family. After consideration of risks, benefits and other options for treatment, the patient has consented to  Procedure(s) (LRB): EXTRACORPOREAL SHOCK WAVE LITHOTRIPSY (ESWL) (Left) as a surgical intervention .  The patients' history has been reviewed, patient examined, no change in status, stable for surgery.  I have reviewed the patients' chart and labs.  Questions were answered to the patient's satisfaction.     [name]

## 2012-09-05 ENCOUNTER — Encounter (INDEPENDENT_AMBULATORY_CARE_PROVIDER_SITE_OTHER): Payer: No Typology Code available for payment source | Admitting: Ophthalmology

## 2012-09-05 DIAGNOSIS — H3530 Unspecified macular degeneration: Secondary | ICD-10-CM

## 2012-09-05 DIAGNOSIS — H353290 Exudative age-related macular degeneration, unspecified eye, stage unspecified: Secondary | ICD-10-CM

## 2012-09-05 DIAGNOSIS — H43819 Vitreous degeneration, unspecified eye: Secondary | ICD-10-CM

## 2012-09-05 DIAGNOSIS — H35039 Hypertensive retinopathy, unspecified eye: Secondary | ICD-10-CM

## 2012-09-05 DIAGNOSIS — I10 Essential (primary) hypertension: Secondary | ICD-10-CM

## 2012-09-10 ENCOUNTER — Ambulatory Visit (INDEPENDENT_AMBULATORY_CARE_PROVIDER_SITE_OTHER): Payer: No Typology Code available for payment source | Admitting: Ophthalmology

## 2012-09-10 DIAGNOSIS — H353290 Exudative age-related macular degeneration, unspecified eye, stage unspecified: Secondary | ICD-10-CM

## 2012-09-10 DIAGNOSIS — I10 Essential (primary) hypertension: Secondary | ICD-10-CM

## 2012-09-10 DIAGNOSIS — H3530 Unspecified macular degeneration: Secondary | ICD-10-CM

## 2012-09-10 DIAGNOSIS — H43819 Vitreous degeneration, unspecified eye: Secondary | ICD-10-CM

## 2012-09-10 DIAGNOSIS — H35039 Hypertensive retinopathy, unspecified eye: Secondary | ICD-10-CM

## 2012-09-28 ENCOUNTER — Encounter (INDEPENDENT_AMBULATORY_CARE_PROVIDER_SITE_OTHER): Payer: No Typology Code available for payment source | Admitting: Ophthalmology

## 2012-09-28 DIAGNOSIS — I10 Essential (primary) hypertension: Secondary | ICD-10-CM

## 2012-09-28 DIAGNOSIS — H35039 Hypertensive retinopathy, unspecified eye: Secondary | ICD-10-CM

## 2012-09-28 DIAGNOSIS — H3530 Unspecified macular degeneration: Secondary | ICD-10-CM

## 2012-09-28 DIAGNOSIS — H43819 Vitreous degeneration, unspecified eye: Secondary | ICD-10-CM

## 2012-09-28 DIAGNOSIS — H353290 Exudative age-related macular degeneration, unspecified eye, stage unspecified: Secondary | ICD-10-CM

## 2012-10-10 HISTORY — PX: CATARACT EXTRACTION W/ INTRAOCULAR LENS  IMPLANT, BILATERAL: SHX1307

## 2012-10-22 ENCOUNTER — Encounter (INDEPENDENT_AMBULATORY_CARE_PROVIDER_SITE_OTHER): Payer: No Typology Code available for payment source | Admitting: Ophthalmology

## 2012-10-22 DIAGNOSIS — I10 Essential (primary) hypertension: Secondary | ICD-10-CM

## 2012-10-22 DIAGNOSIS — H353290 Exudative age-related macular degeneration, unspecified eye, stage unspecified: Secondary | ICD-10-CM

## 2012-10-22 DIAGNOSIS — H35039 Hypertensive retinopathy, unspecified eye: Secondary | ICD-10-CM

## 2012-10-22 DIAGNOSIS — H3530 Unspecified macular degeneration: Secondary | ICD-10-CM

## 2012-10-22 DIAGNOSIS — H43819 Vitreous degeneration, unspecified eye: Secondary | ICD-10-CM

## 2012-11-19 ENCOUNTER — Encounter (INDEPENDENT_AMBULATORY_CARE_PROVIDER_SITE_OTHER): Payer: No Typology Code available for payment source | Admitting: Ophthalmology

## 2012-11-19 DIAGNOSIS — H353290 Exudative age-related macular degeneration, unspecified eye, stage unspecified: Secondary | ICD-10-CM

## 2012-11-19 DIAGNOSIS — I10 Essential (primary) hypertension: Secondary | ICD-10-CM

## 2012-11-19 DIAGNOSIS — H35039 Hypertensive retinopathy, unspecified eye: Secondary | ICD-10-CM

## 2012-11-19 DIAGNOSIS — H3530 Unspecified macular degeneration: Secondary | ICD-10-CM

## 2012-11-19 DIAGNOSIS — H43819 Vitreous degeneration, unspecified eye: Secondary | ICD-10-CM

## 2012-12-17 ENCOUNTER — Encounter (INDEPENDENT_AMBULATORY_CARE_PROVIDER_SITE_OTHER): Payer: No Typology Code available for payment source | Admitting: Ophthalmology

## 2012-12-17 DIAGNOSIS — H353290 Exudative age-related macular degeneration, unspecified eye, stage unspecified: Secondary | ICD-10-CM

## 2012-12-17 DIAGNOSIS — H35039 Hypertensive retinopathy, unspecified eye: Secondary | ICD-10-CM

## 2013-01-18 ENCOUNTER — Encounter (INDEPENDENT_AMBULATORY_CARE_PROVIDER_SITE_OTHER): Payer: Medicare (Managed Care) | Admitting: Ophthalmology

## 2013-01-18 DIAGNOSIS — I10 Essential (primary) hypertension: Secondary | ICD-10-CM

## 2013-01-18 DIAGNOSIS — H35039 Hypertensive retinopathy, unspecified eye: Secondary | ICD-10-CM

## 2013-01-18 DIAGNOSIS — H43819 Vitreous degeneration, unspecified eye: Secondary | ICD-10-CM

## 2013-01-18 DIAGNOSIS — H353290 Exudative age-related macular degeneration, unspecified eye, stage unspecified: Secondary | ICD-10-CM

## 2013-01-18 DIAGNOSIS — H3530 Unspecified macular degeneration: Secondary | ICD-10-CM

## 2013-02-27 ENCOUNTER — Encounter (INDEPENDENT_AMBULATORY_CARE_PROVIDER_SITE_OTHER): Payer: Medicare (Managed Care) | Admitting: Ophthalmology

## 2013-02-27 DIAGNOSIS — H43819 Vitreous degeneration, unspecified eye: Secondary | ICD-10-CM

## 2013-02-27 DIAGNOSIS — H353290 Exudative age-related macular degeneration, unspecified eye, stage unspecified: Secondary | ICD-10-CM

## 2013-02-27 DIAGNOSIS — H3530 Unspecified macular degeneration: Secondary | ICD-10-CM

## 2013-02-27 DIAGNOSIS — I10 Essential (primary) hypertension: Secondary | ICD-10-CM

## 2013-02-27 DIAGNOSIS — H35039 Hypertensive retinopathy, unspecified eye: Secondary | ICD-10-CM

## 2013-02-28 ENCOUNTER — Encounter (INDEPENDENT_AMBULATORY_CARE_PROVIDER_SITE_OTHER): Payer: Medicare (Managed Care) | Admitting: Ophthalmology

## 2013-04-10 ENCOUNTER — Encounter (INDEPENDENT_AMBULATORY_CARE_PROVIDER_SITE_OTHER): Payer: Medicare (Managed Care) | Admitting: Ophthalmology

## 2013-04-10 DIAGNOSIS — H43819 Vitreous degeneration, unspecified eye: Secondary | ICD-10-CM

## 2013-04-10 DIAGNOSIS — H353290 Exudative age-related macular degeneration, unspecified eye, stage unspecified: Secondary | ICD-10-CM

## 2013-04-10 DIAGNOSIS — I10 Essential (primary) hypertension: Secondary | ICD-10-CM

## 2013-04-10 DIAGNOSIS — H3530 Unspecified macular degeneration: Secondary | ICD-10-CM

## 2013-04-10 DIAGNOSIS — H35039 Hypertensive retinopathy, unspecified eye: Secondary | ICD-10-CM

## 2013-06-05 ENCOUNTER — Encounter (INDEPENDENT_AMBULATORY_CARE_PROVIDER_SITE_OTHER): Payer: Medicare (Managed Care) | Admitting: Ophthalmology

## 2013-06-05 DIAGNOSIS — H353290 Exudative age-related macular degeneration, unspecified eye, stage unspecified: Secondary | ICD-10-CM

## 2013-06-05 DIAGNOSIS — H3530 Unspecified macular degeneration: Secondary | ICD-10-CM

## 2013-06-05 DIAGNOSIS — H43819 Vitreous degeneration, unspecified eye: Secondary | ICD-10-CM

## 2013-06-05 DIAGNOSIS — I10 Essential (primary) hypertension: Secondary | ICD-10-CM

## 2013-06-05 DIAGNOSIS — H35039 Hypertensive retinopathy, unspecified eye: Secondary | ICD-10-CM

## 2013-08-07 ENCOUNTER — Encounter (INDEPENDENT_AMBULATORY_CARE_PROVIDER_SITE_OTHER): Payer: Medicare (Managed Care) | Admitting: Ophthalmology

## 2013-08-07 DIAGNOSIS — H43819 Vitreous degeneration, unspecified eye: Secondary | ICD-10-CM

## 2013-08-07 DIAGNOSIS — H35039 Hypertensive retinopathy, unspecified eye: Secondary | ICD-10-CM

## 2013-08-07 DIAGNOSIS — H3530 Unspecified macular degeneration: Secondary | ICD-10-CM

## 2013-08-07 DIAGNOSIS — H353290 Exudative age-related macular degeneration, unspecified eye, stage unspecified: Secondary | ICD-10-CM

## 2013-08-07 DIAGNOSIS — I10 Essential (primary) hypertension: Secondary | ICD-10-CM

## 2013-10-16 ENCOUNTER — Encounter (INDEPENDENT_AMBULATORY_CARE_PROVIDER_SITE_OTHER): Payer: Medicare (Managed Care) | Admitting: Ophthalmology

## 2013-12-23 ENCOUNTER — Encounter (INDEPENDENT_AMBULATORY_CARE_PROVIDER_SITE_OTHER): Payer: Medicare PPO | Admitting: Ophthalmology

## 2013-12-23 DIAGNOSIS — H3530 Unspecified macular degeneration: Secondary | ICD-10-CM

## 2013-12-23 DIAGNOSIS — H43819 Vitreous degeneration, unspecified eye: Secondary | ICD-10-CM

## 2013-12-23 DIAGNOSIS — H35039 Hypertensive retinopathy, unspecified eye: Secondary | ICD-10-CM

## 2013-12-23 DIAGNOSIS — H353290 Exudative age-related macular degeneration, unspecified eye, stage unspecified: Secondary | ICD-10-CM

## 2013-12-23 DIAGNOSIS — I10 Essential (primary) hypertension: Secondary | ICD-10-CM

## 2014-03-24 ENCOUNTER — Encounter (INDEPENDENT_AMBULATORY_CARE_PROVIDER_SITE_OTHER): Payer: Medicare PPO | Admitting: Ophthalmology

## 2014-06-28 ENCOUNTER — Emergency Department (HOSPITAL_COMMUNITY): Payer: Medicare PPO

## 2014-06-28 ENCOUNTER — Emergency Department (HOSPITAL_COMMUNITY)
Admission: EM | Admit: 2014-06-28 | Discharge: 2014-06-28 | Disposition: A | Discharge status: Home / Self Care | Payer: Medicare PPO | Attending: Emergency Medicine | Admitting: Emergency Medicine

## 2014-06-28 ENCOUNTER — Encounter (HOSPITAL_COMMUNITY): Payer: Self-pay | Admitting: Emergency Medicine

## 2014-06-28 DIAGNOSIS — Z853 Personal history of malignant neoplasm of breast: Secondary | ICD-10-CM | POA: Diagnosis not present

## 2014-06-28 DIAGNOSIS — R059 Cough, unspecified: Secondary | ICD-10-CM | POA: Diagnosis not present

## 2014-06-28 DIAGNOSIS — R05 Cough: Secondary | ICD-10-CM | POA: Insufficient documentation

## 2014-06-28 DIAGNOSIS — Z791 Long term (current) use of non-steroidal anti-inflammatories (NSAID): Secondary | ICD-10-CM | POA: Diagnosis not present

## 2014-06-28 DIAGNOSIS — Z79899 Other long term (current) drug therapy: Secondary | ICD-10-CM | POA: Insufficient documentation

## 2014-06-28 DIAGNOSIS — E039 Hypothyroidism, unspecified: Secondary | ICD-10-CM | POA: Diagnosis not present

## 2014-06-28 DIAGNOSIS — M129 Arthropathy, unspecified: Secondary | ICD-10-CM | POA: Insufficient documentation

## 2014-06-28 DIAGNOSIS — R5383 Other fatigue: Principal | ICD-10-CM

## 2014-06-28 DIAGNOSIS — R531 Weakness: Secondary | ICD-10-CM

## 2014-06-28 DIAGNOSIS — Z87442 Personal history of urinary calculi: Secondary | ICD-10-CM | POA: Insufficient documentation

## 2014-06-28 DIAGNOSIS — I10 Essential (primary) hypertension: Secondary | ICD-10-CM | POA: Insufficient documentation

## 2014-06-28 DIAGNOSIS — R5381 Other malaise: Secondary | ICD-10-CM | POA: Diagnosis not present

## 2014-06-28 DIAGNOSIS — Z792 Long term (current) use of antibiotics: Secondary | ICD-10-CM | POA: Diagnosis not present

## 2014-06-28 LAB — COMPREHENSIVE METABOLIC PANEL
ALT: 9 U/L (ref 0–35)
ANION GAP: 13 (ref 5–15)
AST: 17 U/L (ref 0–37)
Albumin: 3.3 g/dL — ABNORMAL LOW (ref 3.5–5.2)
Alkaline Phosphatase: 78 U/L (ref 39–117)
BILIRUBIN TOTAL: 0.5 mg/dL (ref 0.3–1.2)
BUN: 14 mg/dL (ref 6–23)
CALCIUM: 9.1 mg/dL (ref 8.4–10.5)
CHLORIDE: 105 meq/L (ref 96–112)
CO2: 24 meq/L (ref 19–32)
CREATININE: 0.76 mg/dL (ref 0.50–1.10)
GFR, EST AFRICAN AMERICAN: 82 mL/min — AB (ref >90)
GFR, EST NON AFRICAN AMERICAN: 70 mL/min — AB (ref >90)
GLUCOSE: 127 mg/dL — AB (ref 70–99)
Potassium: 3.9 mEq/L (ref 3.7–5.3)
Sodium: 142 mEq/L (ref 137–147)
Total Protein: 6.4 g/dL (ref 6.0–8.3)

## 2014-06-28 LAB — CBC WITH DIFFERENTIAL/PLATELET
BASOS ABS: 0 10*3/uL (ref 0.0–0.1)
Basophils Relative: 0 % (ref 0–1)
EOS ABS: 0.1 10*3/uL (ref 0.0–0.7)
EOS PCT: 1 % (ref 0–5)
HEMATOCRIT: 42.2 % (ref 36.0–46.0)
Hemoglobin: 14.1 g/dL (ref 12.0–15.0)
Lymphocytes Relative: 19 % (ref 12–46)
Lymphs Abs: 1.3 10*3/uL (ref 0.7–4.0)
MCH: 30.7 pg (ref 26.0–34.0)
MCHC: 33.4 g/dL (ref 30.0–36.0)
MCV: 91.9 fL (ref 78.0–100.0)
MONO ABS: 0.9 10*3/uL (ref 0.1–1.0)
Monocytes Relative: 14 % — ABNORMAL HIGH (ref 3–12)
Neutro Abs: 4.2 10*3/uL (ref 1.7–7.7)
Neutrophils Relative %: 66 % (ref 43–77)
PLATELETS: 213 10*3/uL (ref 150–400)
RBC: 4.59 MIL/uL (ref 3.87–5.11)
RDW: 13.9 % (ref 11.5–15.5)
WBC: 6.5 10*3/uL (ref 4.0–10.5)

## 2014-06-28 LAB — URINALYSIS, ROUTINE W REFLEX MICROSCOPIC
Bilirubin Urine: NEGATIVE
Glucose, UA: NEGATIVE mg/dL
Hgb urine dipstick: NEGATIVE
KETONES UR: NEGATIVE mg/dL
NITRITE: NEGATIVE
PROTEIN: NEGATIVE mg/dL
Specific Gravity, Urine: 1.013 (ref 1.005–1.030)
Urobilinogen, UA: 1 mg/dL (ref 0.0–1.0)
pH: 6.5 (ref 5.0–8.0)

## 2014-06-28 LAB — I-STAT TROPONIN, ED: Troponin i, poc: 0.05 ng/mL (ref 0.00–0.08)

## 2014-06-28 LAB — I-STAT CG4 LACTIC ACID, ED: LACTIC ACID, VENOUS: 0.71 mmol/L (ref 0.5–2.2)

## 2014-06-28 LAB — URINE MICROSCOPIC-ADD ON

## 2014-06-28 MED ORDER — SODIUM CHLORIDE 0.9 % IV BOLUS (SEPSIS)
500.0000 mL | Freq: Once | INTRAVENOUS | Status: AC
  Administered 2014-06-28: 500 mL via INTRAVENOUS

## 2014-06-28 NOTE — ED Notes (Signed)
Pt remains monitored by blood pressure, pulse ox, and 12 lead. Pts family remains at bedside.  

## 2014-06-28 NOTE — ED Notes (Signed)
Pt had soiled linen. Pt changed of soiled linen and provided pericare. Pt remains monitored by blood pressure, pulse ox, and 12 lead. Pts family remains at bedside.

## 2014-06-28 NOTE — ED Provider Notes (Signed)
Medical screening examination/treatment/procedure(s) were conducted as a shared visit with non-physician practitioner(s) and myself.  I personally evaluated the patient during the encounter.   EKG Interpretation None     General weakness, no AMS or lateralizing weakness.  Babette Relic, MD 07/09/14 [phone]

## 2014-06-28 NOTE — ED Provider Notes (Signed)
CSN: [id number]     Arrival date & time 06/28/14  0945 History   First MD Initiated Contact with Patient 06/28/14 1007     Chief Complaint  Patient presents with  . Weakness     (Consider location/radiation/quality/duration/timing/severity/associated sxs/prior Treatment) HPI Comments: Patient presents with complaint of generalized weakness and trouble walking for the past 2 days. Family reports that patient has had bronchitis symptoms including cough for approximately one week. Yesterday her symptoms became worse. She normally can walk well but this morning she could not because of weakness. Patient/family denies signs of stroke including: facial droop, slurred speech, aphasia, weakness/numbness in extremities. She was seen by PCP yesterday, had neg UA, clear lungs -- was started on azithromycin. No lower extremity edema. Patient is not typically on home oxygen, low O2 sat upon EMS arrival. The onset of this condition was acute. The course is constant. Aggravating factors: none. Alleviating factors: none.     Patient is a 80 y.o. female presenting with weakness. The history is provided by the patient and a relative.  Weakness Associated symptoms include coughing and weakness. Pertinent negatives include no abdominal pain, chest pain, congestion, fever, headaches, myalgias, nausea, neck pain, numbness, rash, sore throat or vomiting.    Past Medical History  Diagnosis Date  . Hypertension   . Shortness of breath   . Hypothyroidism   . kidney calculi   . Arthritis   . PONV (postoperative nausea and vomiting)   . Cancer     mastectomy 30 yr ago   Past Surgical History  Procedure Laterality Date  . Breast surgery    . Cystoscopy w/ ureteral stent placement    . Eye surgery    . Cystoscopy w/ ureteral stent placement  01/17/2012    Procedure: CYSTOSCOPY WITH RETROGRADE PYELOGRAM/URETERAL STENT PLACEMENT;  Surgeon: Franchot Gallo, MD;  Location: [facility];  Service: Urology;  Laterality:  Left;   History reviewed. No pertinent family history. History  Substance Use Topics  . Smoking status: Never Smoker   . Smokeless tobacco: Never Used  . Alcohol Use: No   OB History   Grav Para Term Preterm Abortions TAB SAB Ect Mult Living                 Review of Systems  Constitutional: Negative for fever.  HENT: Negative for congestion, rhinorrhea, sinus pressure and sore throat.   Eyes: Negative for photophobia, discharge, redness and visual disturbance.  Respiratory: Positive for cough. Negative for shortness of breath.   Cardiovascular: Negative for chest pain.  Gastrointestinal: Negative for nausea, vomiting, abdominal pain and diarrhea.  Genitourinary: Negative for dysuria.  Musculoskeletal: Positive for gait problem. Negative for myalgias, neck pain and neck stiffness.  Skin: Negative for rash.  Neurological: Positive for weakness. Negative for syncope, speech difficulty, light-headedness, numbness and headaches.  Psychiatric/Behavioral: Negative for confusion.      Allergies  Codeine  Home Medications   Prior to Admission medications   Medication Sig Start Date End Date Taking? Authorizing Provider  amLODipine (NORVASC) 5 MG tablet Take 5 mg by mouth daily with breakfast.    Yes Historical Provider, MD  azithromycin (ZITHROMAX) 250 MG tablet Take 250-500 mg by mouth daily. Take 500 mg by mouth on day 1, then take 250 mg by mouth on days 2-5 06/27/14  Yes Historical Provider, MD  cetirizine (ZYRTEC) 10 MG tablet Take 10 mg by mouth daily.   Yes Historical Provider, MD  dextromethorphan (DELSYM) 30 MG/5ML liquid Take 60  mg by mouth as needed for cough.   Yes Historical Provider, MD  dextromethorphan-guaiFENesin (MUCINEX DM) 30-600 MG per 12 hr tablet Take 1 tablet by mouth at bedtime as needed for cough.   Yes Historical Provider, MD  ibuprofen (ADVIL,MOTRIN) 200 MG tablet Take 400 mg by mouth at bedtime.   Yes Historical Provider, MD  levothyroxine (SYNTHROID,  LEVOTHROID) 50 MCG tablet Take 50 mcg by mouth daily with breakfast.    Yes Historical Provider, MD  losartan (COZAAR) 50 MG tablet Take 50 mg by mouth daily with breakfast.    Yes Historical Provider, MD  meloxicam (MOBIC) 7.5 MG tablet Take 7.5 mg by mouth daily as needed. Pain and inflammation    Historical Provider, MD   BP 146/59  Pulse 83  Temp(Src) 99.5 F (37.5 C) (Rectal)  Resp 21  SpO2 98%  Physical Exam  Nursing note and vitals reviewed. Constitutional: She is oriented to person, place, and time. She appears well-developed and well-nourished.  HENT:  Head: Normocephalic and atraumatic.  Right Ear: Tympanic membrane, external ear and ear canal normal.  Left Ear: Tympanic membrane, external ear and ear canal normal.  Nose: Nose normal.  Mouth/Throat: Uvula is midline, oropharynx is clear and moist and mucous membranes are normal.  Eyes: Conjunctivae, EOM and lids are normal. Pupils are equal, round, and reactive to light. Right eye exhibits no nystagmus. Left eye exhibits no nystagmus.  Neck: Normal range of motion. Neck supple. No JVD present.  No carotid bruit.  Cardiovascular: Normal rate and regular rhythm.   No murmur heard. Pulmonary/Chest: Effort normal and breath sounds normal. No respiratory distress. She has no wheezes. She has no rales.  Occasional cough during exam.   Abdominal: Soft. There is no tenderness.  Musculoskeletal: She exhibits no edema and no tenderness.       Cervical back: She exhibits normal range of motion, no tenderness and no bony tenderness.  Significant varicosity.   Neurological: She is alert and oriented to person, place, and time. She has normal strength and normal reflexes. No cranial nerve deficit or sensory deficit. She displays a negative Romberg sign. Coordination and gait normal. GCS eye subscore is 4. GCS verbal subscore is 5. GCS motor subscore is 6.  Skin: Skin is warm and dry.  Psychiatric: She has a normal mood and affect.     ED Course  Procedures (including critical care time) Labs Review Labs Reviewed  CBC WITH DIFFERENTIAL - Abnormal; Notable for the following:    Monocytes Relative 14 (*)    All other components within normal limits  COMPREHENSIVE METABOLIC PANEL - Abnormal; Notable for the following:    Glucose, Bld 127 (*)    Albumin 3.3 (*)    GFR calc non Af Amer 70 (*)    GFR calc Af Amer 82 (*)    All other components within normal limits  URINALYSIS, ROUTINE W REFLEX MICROSCOPIC - Abnormal; Notable for the following:    APPearance CLOUDY (*)    Leukocytes, UA SMALL (*)    All other components within normal limits  URINE MICROSCOPIC-ADD ON - Abnormal; Notable for the following:    Squamous Epithelial / LPF MANY (*)    Bacteria, UA MANY (*)    All other components within normal limits  I-STAT CG4 LACTIC ACID, ED  Randolm Idol, ED    Imaging Review Dg Chest Portable 1 View  06/28/2014   CLINICAL DATA:  Increasing generalized weakness.  Hypertension.  EXAM: PORTABLE CHEST -  1 VIEW  COMPARISON:  01/17/2012.  FINDINGS: The cardiac silhouette remains mildly enlarged. The pulmonary vasculature and interstitial markings remain mildly prominent. The lungs remain mildly hyperexpanded. Mild biapical pleural and parenchymal scarring is unchanged. Diffuse osteopenia. Thoracic spine degenerative changes and mild scoliosis.  IMPRESSION: Stable mild cardiomegaly and mild pulmonary vascular congestion with underlying changes of COPD.   Electronically Signed   By: Enrique Sack M.D.   On: 06/28/2014 10:40     EKG Interpretation   Date/Time:  Saturday June 28 2014 10:01:35 EDT Ventricular Rate:  96 PR Interval:  192 QRS Duration: 104 QT Interval:  338 QTC Calculation: 427 R Axis:   -50 Text Interpretation:  Sinus rhythm Multiple premature complexes, vent   Inferior infarct, old Anterior infarct, old Compared to previous tracing  Septal infarct NOW PRESENT Confirmed by [facility]  MD, Jenny Reichmann ([id number])  on  06/28/2014 11:37:56 AM      11:05 AM Patient seen and examined. Work-up initiated. Medications ordered.   Vital signs reviewed and are as follows: BP 146/59  Pulse 83  Temp(Src) 99.5 F (37.5 C) (Rectal)  Resp 21  SpO2 98%  Patient discussed with and seen by Dr. Stevie Kern. No evidence or suspicion of CVA/TIA.   2:26 PM Patient has done well during ED stay. Patient has ambulated to the bathroom at her baseline. Pt and family updated on results. They want to go home. They are comfortable with caring for patient at home, returning to ED with worsening, seeing primary care physician for followup in 2 days.  Patient counseled to return if they have weakness in their arms or legs, slurred speech, trouble walking or talking, confusion, trouble with their balance, or if they have any other concerns. Patient verbalizes understanding and agrees with plan.      MDM   Final diagnoses:  Generalized weakness   Patient presents with generalized weakness. No focal abnormalities suggested by history or on exam to suggest TIA or stroke. Patient has normal lactic acid, troponin. EKG showed sinus rhythm with ectopy. CBC, CMP unremarkable. UA is contaminated and patient does not have any dysuria or other urinary symptoms. Chest x-ray is stable from prior. Patient has ambulated. Patient has good PCP followup and family seems reliable to care for patient return if symptoms worsen. They're comfortable with this plan and voiced no concerns at discharge.  No dangerous or life-threatening conditions suspected or identified by history, physical exam, and by work-up. No indications for hospitalization identified.       Carlisle Cater, PA-C 06/28/14 1429

## 2014-06-28 NOTE — Discharge Instructions (Signed)
Please read and follow all provided instructions.  Your diagnoses today include:  1. Generalized weakness    Tests performed today include:  Blood counts and electrolytes - normal  Urine test - no definite infection  Blood test for heart muscle damage - no heart attack  EKG - skipped beats  Vital signs. See below for your results today.   Medications prescribed:   None  Take any prescribed medications only as directed.  Home care instructions:  Follow any educational materials contained in this packet.   Follow-up instructions: Please follow-up with your primary care provider in the next 2-3 days for further evaluation of your symptoms.   Return instructions:   Please return to the Emergency Department if you experience worsening symptoms.  Patient counseled to return if they have weakness in their arms or legs, slurred speech, trouble walking or talking, confusion, trouble with their balance, or if they have any other concerns. Patient verbalizes understanding and agrees with plan.   Please return if you have any other emergent concerns.  Additional Information:  Your vital signs today were: BP 136/53   Pulse 74   Temp(Src) 98.3 F (36.8 C) (Oral)   Resp 19   SpO2 95% If your blood pressure (BP) was elevated above 135/85 this visit, please have this repeated by your doctor within one month. --------------

## 2014-06-28 NOTE — ED Notes (Signed)
Lab results was given to Dr.Bednar.

## 2014-06-28 NOTE — ED Notes (Addendum)
Pt from home via [facility] with c/o increasing generalized weakness starting yesterday morning.  Pt saw her PCP who stated no UTI but provided a z-pack for poissible allergies.  Pt is normally ambulatory and lives along but now cannot walk.  EMS reports SpO2 90% on RA, 96% on 4 L, rhonchi on right side.  EKG shows a-fib with no known hx of same.  Pt in NAD and A&Ox4.

## 2014-06-28 NOTE — ED Notes (Signed)
Pt was able to ambulate in hallway and to restroom with stand by assistance only.

## 2015-10-11 HISTORY — PX: PERIPHERAL ARTERIAL STENT GRAFT: SHX2220

## 2016-04-06 ENCOUNTER — Ambulatory Visit
Admission: RE | Admit: 2016-04-06 | Discharge: 2016-04-06 | Disposition: A | Discharge status: Home / Self Care | Payer: Medicare PPO | Source: Ambulatory Visit | Attending: Internal Medicine | Admitting: Internal Medicine

## 2016-04-06 ENCOUNTER — Other Ambulatory Visit: Payer: Self-pay | Admitting: Internal Medicine

## 2016-04-06 ENCOUNTER — Encounter: Payer: Medicare PPO | Attending: Internal Medicine | Admitting: Internal Medicine

## 2016-04-06 DIAGNOSIS — E039 Hypothyroidism, unspecified: Secondary | ICD-10-CM | POA: Insufficient documentation

## 2016-04-06 DIAGNOSIS — L97523 Non-pressure chronic ulcer of other part of left foot with necrosis of muscle: Secondary | ICD-10-CM | POA: Diagnosis not present

## 2016-04-06 DIAGNOSIS — H3530 Unspecified macular degeneration: Secondary | ICD-10-CM | POA: Diagnosis not present

## 2016-04-06 DIAGNOSIS — S81802A Unspecified open wound, left lower leg, initial encounter: Secondary | ICD-10-CM

## 2016-04-06 DIAGNOSIS — G250 Essential tremor: Secondary | ICD-10-CM | POA: Insufficient documentation

## 2016-04-06 DIAGNOSIS — Z853 Personal history of malignant neoplasm of breast: Secondary | ICD-10-CM | POA: Diagnosis not present

## 2016-04-06 DIAGNOSIS — M85872 Other specified disorders of bone density and structure, left ankle and foot: Secondary | ICD-10-CM | POA: Diagnosis not present

## 2016-04-06 DIAGNOSIS — L97211 Non-pressure chronic ulcer of right calf limited to breakdown of skin: Secondary | ICD-10-CM | POA: Diagnosis not present

## 2016-04-06 DIAGNOSIS — L97321 Non-pressure chronic ulcer of left ankle limited to breakdown of skin: Secondary | ICD-10-CM | POA: Diagnosis not present

## 2016-04-06 DIAGNOSIS — I70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: Secondary | ICD-10-CM | POA: Diagnosis not present

## 2016-04-06 DIAGNOSIS — I70243 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: Secondary | ICD-10-CM | POA: Insufficient documentation

## 2016-04-06 DIAGNOSIS — I10 Essential (primary) hypertension: Secondary | ICD-10-CM | POA: Diagnosis not present

## 2016-04-07 NOTE — Progress Notes (Signed)
[name], [name] ([id number]) Visit Report for 04/06/2016 Abuse/Suicide Risk Screen Details Patient Name: [name], [name] Date of Service: 04/06/2016 9:30 AM Medical Record Patient Account Number: [id number] Number: Treating RN: Ahmed Prima [birth year] (82 y.o. Other Clinician: Date of Birth/Sex: Female) Treating ROBSON, MICHAEL Primary Care Physician/Extender: Windell Moment Physician: Referring Physician: CLINE, TODD Weeks in Treatment: 0 Abuse/Suicide Risk Screen Items Answer ABUSE/SUICIDE RISK SCREEN: Has anyone close to you tried to hurt or harm you recentlyo No Do you feel uncomfortable with anyone in your familyo No Has anyone forced you do things that you didnot want to doo No Do you have any thoughts of harming yourselfo No Patient displays signs or symptoms of abuse and/or neglect. No Electronic Signature(s) Signed: 04/06/2016 5:39:03 PM By: Alric Quan Entered By: Alric Quan on 04/06/2016 09:47:31 [name] ([id number]) -------------------------------------------------------------------------------- Activities of Daily Living Details Patient Name: [name] Date of Service: 04/06/2016 9:30 AM Medical Record Patient Account Number: [id number] Number: Treating RN: Ahmed Prima Jan 26, 1919 (82 y.o. Other Clinician: Date of Birth/Sex: Female) Treating ROBSON, MICHAEL Primary Care Physician/Extender: Windell Moment Physician: Referring Physician: CLINE, TODD Weeks in Treatment: 0 Activities of Daily Living Items Answer Activities of Daily Living (Please select one for each item) Drive Automobile Not Able Take Medications Need Assistance Use Telephone Completely Able Care for Appearance Completely Able Use Toilet Completely Able Bath / Shower Completely Able Dress Self Completely Able Feed Self Completely Able Walk Completely Able Get In / Out Bed Completely [place] Need Assistance Shop for Self Need Assistance Electronic Signature(s) Signed: 04/06/2016 5:39:03 PM By: Alric Quan Entered By: Alric Quan on 04/06/2016 09:48:38 [name] ([id number]) -------------------------------------------------------------------------------- Education Assessment Details Patient Name: [name] Date of Service: 04/06/2016 9:30 AM Medical Record Patient Account Number: [id number] Number: Treating RN: Ahmed Prima 1919-01-27 (82 y.o. Other Clinician: Date of Birth/Sex: Female) Treating ROBSON, MICHAEL Primary Care Physician/Extender: Windell Moment Physician: Referring Physician: CLINE, TODD Weeks in Treatment: 0 Primary Learner Assessed: Patient Learning Preferences/Education Level/Primary Language Learning Preference: Explanation Highest Education Level: Grade School Preferred Language: English Cognitive Barrier Assessment/Beliefs Language Barrier: No Translator Needed: No Memory Deficit: No Emotional Barrier: No Cultural/Religious Beliefs Affecting Medical No Care: Physical Barrier Assessment Impaired Vision: Yes Glasses Impaired Hearing: Yes HOH Decreased Hand dexterity: No Knowledge/Comprehension Assessment Knowledge Level: High Comprehension Level: High Ability to understand written High instructions: Ability to understand verbal High instructions: Motivation Assessment Anxiety Level: Calm Cooperation: Cooperative Education Importance: Acknowledges Need Interest in Health Problems: Asks Questions Perception: Coherent Willingness to Engage in Self- High Management Activities: [name], [name] ([id number]) Readiness to Engage in Self- Management Activities: Electronic Signature(s) Signed: 04/06/2016 5:39:03 PM By: Alric Quan Entered By: Alric Quan on 04/06/2016 09:49:10 [name]  ([id number]) -------------------------------------------------------------------------------- Fall Risk Assessment Details Patient Name: [name] Date of Service: 04/06/2016 9:30 AM Medical Record Patient Account Number: [id number] Number: Treating RN: Ahmed Prima Jul 12, 1919 (82 y.o. Other Clinician: Date of Birth/Sex: Female) Treating ROBSON, MICHAEL Primary Care Physician/Extender: Windell Moment Physician: Referring Physician: CLINE, TODD Weeks in Treatment: 0 Fall Risk Assessment Items Have you had 2 or more falls in the last 12 monthso 0 No Have you had any fall that resulted in injury in the last 12 monthso 0 No FALL RISK ASSESSMENT: History of falling - immediate or within 3 months 0 No Secondary diagnosis 15 Yes Ambulatory aid None/bed rest/wheelchair/nurse 0 No Crutches/cane/walker 15 Yes Furniture 0 No IV Access/Saline Lock  0 No Gait/Training Normal/bed rest/immobile 0 No Weak 10 Yes Impaired 0 No Mental Status Oriented to own ability 0 Yes Electronic Signature(s) Signed: 04/06/2016 5:39:03 PM By: Alric Quan Entered By: Alric Quan on 04/06/2016 09:49:58 [name] ([id number]) -------------------------------------------------------------------------------- Foot Assessment Details Patient Name: [name] Date of Service: 04/06/2016 9:30 AM Medical Record Patient Account Number: [id number] Number: Treating RN: Ahmed Prima July 18, 1919 (82 y.o. Other Clinician: Date of Birth/Sex: Female) Treating ROBSON, MICHAEL Primary Care Physician/Extender: Windell Moment Physician: Referring Physician: CLINE, TODD Weeks in Treatment: 0 Foot Assessment Items Site Locations + = Sensation present, - = Sensation absent, C = Callus, U = Ulcer R = Redness, W = Warmth, M = Maceration, PU = Pre-ulcerative lesion F = Fissure, S = Swelling, D = Dryness Assessment Right: Left: Other Deformity: No No Prior Foot  Ulcer: No No Prior Amputation: No No Charcot Joint: No No Ambulatory Status: Ambulatory Without Help Gait: Steady Electronic Signature(s) Signed: 04/06/2016 5:39:03 PM By: Alric Quan Entered By: Alric Quan on 04/06/2016 09:52:41 [name] ([id number]) [name], [name] ([id number]) -------------------------------------------------------------------------------- Nutrition Risk Assessment Details Patient Name: [name] Date of Service: 04/06/2016 9:30 AM Medical Record Patient Account Number: [id number] Number: Treating RN: Ahmed Prima [birth year] (82 y.o. Other Clinician: Date of Birth/Sex: Female) Treating ROBSON, MICHAEL Primary Care Physician/Extender: Windell Moment Physician: Referring Physician: CLINE, TODD Weeks in Treatment: 0 Height (in): 64 Weight (lbs): 158 Body Mass Index (BMI): 27.1 Nutrition Risk Assessment Items NUTRITION RISK SCREEN: I have an illness or condition that made me change the kind and/or 2 Yes amount of food I eat I eat fewer than two meals per day 0 No I eat few fruits and vegetables, or milk products 0 No I have three or more drinks of beer, liquor or wine almost every day 0 No I have tooth or mouth problems that make it hard for me to eat 0 No I don't always have enough money to buy the food I need 0 No I eat alone most of the time 0 No I take three or more different prescribed or over-the-counter drugs a 1 Yes day Without wanting to, I have lost or gained 10 pounds in the last six 0 No months I am not always physically able to shop, cook and/or feed myself 0 No Nutrition Protocols Good Risk Protocol Moderate Risk Protocol Electronic Signature(s) Signed: 04/06/2016 5:39:03 PM By: Alric Quan Entered By: Alric Quan on 04/06/2016 09:50:18

## 2016-04-07 NOTE — Progress Notes (Signed)
[name], [name] ([id number]) Visit Report for 04/06/2016 Allergy List Details Patient Name: [name], [name] Date of Service: 04/06/2016 9:30 AM Medical Record Patient Account Number: [id number] Number: Treating RN: Ahmed Prima May 28, 1919 (82 y.o. Other Clinician: Date of Birth/Sex: Female) Treating [name], [name] Primary Care Physician: Elizebeth Koller Physician/Extender: G Referring Physician: CLINE, TODD Weeks in Treatment: 0 Allergies Active Allergies codeine Reaction: sick on stomach Severity: Severe Allergy Notes Electronic Signature(s) Signed: 04/06/2016 5:39:03 PM By: Alric Quan Entered By: Alric Quan on 04/06/2016 10:20:28 [name] ([id number]) -------------------------------------------------------------------------------- Arrival Information Details Patient Name: [name] Date of Service: 04/06/2016 9:30 AM Medical Record Patient Account Number: [id number] Number: Treating RN: Ahmed Prima Feb 22, 1919 (82 y.o. Other Clinician: Date of Birth/Sex: Female) Treating [name], [name] Primary Care Physician: Elizebeth Koller Physician/Extender: G Referring Physician: CLINE, TODD Weeks in Treatment: 0 Visit Information Patient Arrived: Ambulatory Arrival Time: 09:28 Accompanied By: daughters Transfer Assistance: EasyPivot Patient Lift Patient Identification Verified: Yes Secondary Verification Process Yes Completed: Patient Requires Transmission- No Based Precautions: Patient Has Alerts: No Electronic Signature(s) Signed: 04/06/2016 5:39:03 PM By: Alric Quan Entered By: Alric Quan on 04/06/2016 09:29:23 [name] ([id number]) -------------------------------------------------------------------------------- Clinic Level of Care Assessment Details Patient Name: [name] Date of Service: 04/06/2016 9:30 AM Medical Record Patient Account Number: [id number] Number: Treating  RN: Ahmed Prima 1919/03/31 (82 y.o. Other Clinician: Date of Birth/Sex: Female) Treating [name], [name] Primary Care Physician: Elizebeth Koller Physician/Extender: G Referring Physician: CLINE, TODD Weeks in Treatment: 0 Clinic Level of Care Assessment Items TOOL 1 Quantity Score X - Use when EandM and Procedure is performed on INITIAL visit 1 0 ASSESSMENTS - Nursing Assessment / Reassessment X - General Physical Exam (combine w/ comprehensive assessment (listed just 1 20 below) when performed on new pt. evals) X - Comprehensive Assessment (HX, ROS, Risk Assessments, Wounds Hx, etc.) 1 25 ASSESSMENTS - Wound and Skin Assessment / Reassessment []  - Dermatologic / Skin Assessment (not related to wound area) 0 ASSESSMENTS - Ostomy and/or Continence Assessment and Care []  - Incontinence Assessment and Management 0 []  - Ostomy Care Assessment and Management (repouching, etc.) 0 PROCESS - Coordination of Care []  - Simple Patient / Family Education for ongoing care 0 X - Complex (extensive) Patient / Family Education for ongoing care 1 20 X - Staff obtains Programmer, systems, Records, Test Results / Process Orders 1 10 []  - Staff telephones HHA, Nursing Homes / Clarify orders / etc 0 []  - Routine Transfer to another Facility (non-emergent condition) 0 []  - Routine Hospital Admission (non-emergent condition) 0 X - New Admissions / Biomedical engineer / Ordering NPWT, Apligraf, etc. 1 15 []  - Emergency Hospital Admission (emergent condition) 0 PROCESS - Special Needs []  - Pediatric / Minor Patient Management 0 Robidoux, Kellee ([id number]) []  - Isolation Patient Management 0 []  - Hearing / Language / Visual special needs 0 []  - Assessment of Community assistance (transportation, D/C planning, etc.) 0 []  - Additional assistance / Altered mentation 0 []  - Support Surface(s) Assessment (bed, cushion, seat, etc.) 0 INTERVENTIONS - Miscellaneous []  - External ear exam 0 []  - Patient  Transfer (multiple staff / Civil Service fast streamer / Similar devices) 0 []  - Simple Staple / Suture removal (25 or less) 0 []  - Complex Staple / Suture removal (26 or more) 0 []  - Hypo/Hyperglycemic Management (do not check if billed separately) 0 X - Ankle / Brachial Index (ABI) - do not check if billed separately 1 15 Has the patient been seen at the hospital within the last  three years: Yes Total Score: 105 Level Of Care: New/Established - Level 3 Electronic Signature(s) Signed: 04/06/2016 5:39:03 PM By: Alric Quan Entered By: Alric Quan on 04/06/2016 13:57:33 [name] ([id number]) -------------------------------------------------------------------------------- Encounter Discharge Information Details Patient Name: [name] Date of Service: 04/06/2016 9:30 AM Medical Record Patient Account Number: [id number] Number: Treating RN: Ahmed Prima March 14, 1919 (82 y.o. Other Clinician: Date of Birth/Sex: Female) Treating [name], [place] Primary Care Physician: Elizebeth Koller Physician/Extender: G Referring Physician: CLINE, TODD Weeks in Treatment: 0 Encounter Discharge Information Items Discharge Pain Level: 0 Discharge Condition: Stable Ambulatory Status: Ambulatory Discharge Destination: Home Transportation: Private Auto Accompanied By: daughters Schedule Follow-up Appointment: Yes Medication Reconciliation completed and provided to Patient/Care No Loyce Klasen: Provided on Clinical Summary of Care: 04/06/2016 Form Type Recipient Paper Patient [facility] Electronic Signature(s) Signed: 04/06/2016 11:39:40 AM By: Ruthine Dose Entered By: Ruthine Dose on 04/06/2016 11:39:40 [name] ([id number]) -------------------------------------------------------------------------------- Lower Extremity Assessment Details Patient Name: [name] Date of Service: 04/06/2016 9:30 AM Medical Record Patient Account Number:  [id number] Number: Treating RN: Ahmed Prima [birth year] (82 y.o. Other Clinician: Date of Birth/Sex: Female) Treating [name], [place] Primary Care Physician: Elizebeth Koller Physician/Extender: G Referring Physician: CLINE, TODD Weeks in Treatment: 0 Edema Assessment Assessed: [Left: No] [Right: No] Edema: [Left: Yes] [Right: Yes] Calf Left: Right: Point of Measurement: 34 cm From Medial Instep 34 cm 35 cm Ankle Left: Right: Point of Measurement: 10 cm From Medial Instep 19.5 cm 19.6 cm Vascular Assessment Pulses: Posterior Tibial Dorsalis Pedis Palpable: [Left:No] [Right:No] Doppler: [Left:Multiphasic] [Right:Multiphasic] Extremity colors, hair growth, and conditions: Extremity Color: [Left:Mottled] [Right:Mottled] Hair Growth on Extremity: [Left:No] Temperature of Extremity: [Left:Cool] [Right:Cool] Capillary Refill: [Left:> 3 seconds] [Right:> 3 seconds] Blood Pressure: Brachial: [Left:174] [Right:174] Dorsalis Pedis: 60 [Left:Dorsalis Pedis: 58] Ankle: Posterior Tibial: 70 [Left:Posterior Tibial: 0.40] [Right:0.33] Toe Nail Assessment Left: Right: Thick: No No Discolored: No No Deformed: No No Improper Length and Hygiene: No No JIAQI, RAHN ([id number]) Electronic Signature(s) Signed: 04/06/2016 5:39:03 PM By: Alric Quan Entered By: Alric Quan on 04/06/2016 10:08:39 [name] ([id number]) -------------------------------------------------------------------------------- Multi Wound Chart Details Patient Name: [name] Date of Service: 04/06/2016 9:30 AM Medical Record Patient Account Number: [id number] Number: Treating RN: Ahmed Prima 1919/03/17 (82 y.o. Other Clinician: Date of Birth/Sex: Female) Treating [name], [place] Primary Care Physician: Elizebeth Koller Physician/Extender: G Referring Physician: CLINE, TODD Weeks in Treatment: 0 Vital Signs Height(in): 64 Pulse(bpm): 78 Weight(lbs): 158  Blood Pressure 174/62 (mmHg): Body Mass Index(BMI): 27 Temperature(F): 98.3 Respiratory Rate 16 (breaths/min): Photos: [1:No Photos] [2:No Photos] [3:No Photos] Wound Location: [1:Left Malleolus] [2:Left Toe Fifth - Lateral] [3:Right Lower Leg - Medial] Wounding Event: [1:Gradually Appeared] [2:Gradually Appeared] [3:Gradually Appeared] Primary Etiology: [1:Venous Leg Ulcer] [2:To be determined] [3:Venous Leg Ulcer] Comorbid History: [1:Cataracts, Hypertension, Osteoarthritis] [2:Cataracts, Hypertension, Osteoarthritis] [3:Cataracts, Hypertension, Osteoarthritis] Date Acquired: [1:04/07/2015] [2:04/07/2015] [3:04/06/2016] Weeks of Treatment: [1:0] [2:0] [3:0] Wound Status: [1:Open] [2:Open] [3:Open] Measurements L x W x D 0.7x0.7x0.2 [2:0.3x0.2x0.2] [3:0.5x0.3x0.1] (cm) Area (cm) : [1:0.385] [2:0.047] [3:0.118] Volume (cm) : [1:0.077] [2:0.009] [3:0.012] Classification: [1:Partial Thickness] [2:Partial Thickness] [3:Partial Thickness] Exudate Amount: [1:Large] [2:Large] [3:Large] Exudate Type: [1:Serous] [2:Serous] [3:Serous] Exudate Color: [1:amber] [2:amber] [3:amber] Wound Margin: [1:Thickened] [2:Thickened] [3:Flat and Intact] Granulation Amount: [1:None Present (0%)] [2:None Present (0%)] [3:None Present (0%)] Necrotic Amount: [1:Large (67-100%)] [2:Large (67-100%)] [3:Large (67-100%)] Exposed Structures: [1:Fascia: No Fat: No Tendon: No Muscle: No Joint: No Bone: No Limited to Skin Breakdown] [2:Fascia: No Fat: No Tendon: No Muscle: No Joint: No Bone: No Limited to Skin  Breakdown] [3:Fascia: No Fat: No Tendon: No Muscle: No Joint: No Bone: No Limited to  Skin Breakdown] Epithelialization: None None None Periwound Skin Texture: Edema: Yes Edema: Yes Edema: Yes Periwound Skin No Abnormalities Noted No Abnormalities Noted No Abnormalities Noted Moisture: Periwound Skin Color: Erythema: Yes Erythema: Yes Erythema: Yes Erythema Location: Circumferential Circumferential  Circumferential Temperature: No Abnormality No Abnormality No Abnormality Tenderness on Yes Yes Yes Palpation: Wound Preparation: Ulcer Cleansing: Ulcer Cleansing: Ulcer Cleansing: Rinsed/Irrigated with Rinsed/Irrigated with Rinsed/Irrigated with Saline Saline Saline Topical Anesthetic Topical Anesthetic Topical Anesthetic Applied: Other: lidocaine Applied: Other: lidocaine Applied: Other: lidocaine 4% 4% 4% Treatment Notes Electronic Signature(s) Signed: 04/06/2016 5:39:03 PM By: Alric Quan Entered By: Alric Quan on 04/06/2016 10:42:04 [name] ([id number]) -------------------------------------------------------------------------------- [place] Details Patient Name: [name] Date of Service: 04/06/2016 9:30 AM Medical Record Patient Account Number: [id number] Number: Treating RN: Ahmed Prima [birth year] (82 y.o. Other Clinician: Date of Birth/Sex: Female) Treating [name], [place] Primary Care Physician: Elizebeth Koller Physician/Extender: G Referring Physician: CLINE, TODD Weeks in Treatment: 0 Active Inactive Abuse / Safety / Falls / Self Care Management Nursing Diagnoses: Potential for falls Goals: Patient will remain injury free Date Initiated: 04/06/2016 Goal Status: Active Interventions: Assess fall risk on admission and as needed Notes: Nutrition Nursing Diagnoses: Imbalanced nutrition Goals: Patient/caregiver agrees to and verbalizes understanding of need to use nutritional supplements and/or vitamins as prescribed Date Initiated: 04/06/2016 Goal Status: Active Interventions: Assess patient nutrition upon admission and as needed per policy Notes: Orientation to the Wound Care Program Nursing Diagnoses: Knowledge deficit related to the wound healing center program DESERIE, TONG ([id number]) Goals: Patient/caregiver will verbalize understanding of the [place] Program Date  Initiated: 04/06/2016 Goal Status: Active Interventions: Provide education on orientation to the wound center Notes: Pain, Acute or Chronic Nursing Diagnoses: Pain, acute or chronic: actual or potential Potential alteration in comfort, pain Goals: Patient will verbalize adequate pain control and receive pain control interventions during procedures as needed Date Initiated: 04/06/2016 Goal Status: Active Interventions: Assess comfort goal upon admission Complete pain assessment as per visit requirements Notes: Wound/Skin Impairment Nursing Diagnoses: Impaired tissue integrity Goals: Ulcer/skin breakdown will have a volume reduction of 30% by week 4 Date Initiated: 04/06/2016 Goal Status: Active Ulcer/skin breakdown will have a volume reduction of 50% by week 8 Date Initiated: 04/06/2016 Goal Status: Active Ulcer/skin breakdown will have a volume reduction of 80% by week 12 Date Initiated: 04/06/2016 Goal Status: Active Interventions: Assess patient/caregiver ability to obtain necessary supplies [place], [place] ([id number]) Assess ulceration(s) every visit Notes: Electronic Signature(s) Signed: 04/06/2016 5:39:03 PM By: Alric Quan Entered By: Alric Quan on 04/06/2016 10:41:38 [name] ([id number]) -------------------------------------------------------------------------------- Pain Assessment Details Patient Name: [name] Date of Service: 04/06/2016 9:30 AM Medical Record Patient Account Number: [id number] Number: Treating RN: Ahmed Prima 1919/03/20 (82 y.o. Other Clinician: Date of Birth/Sex: Female) Treating [name], [place] Primary Care Physician: Elizebeth Koller Physician/Extender: G Referring Physician: CLINE, TODD Weeks in Treatment: 0 Active Problems Location of Pain Severity and Description of Pain Patient Has Paino No Site Locations With Dressing Change: No Pain Management and Medication Current Pain  Management: Electronic Signature(s) Signed: 04/06/2016 5:39:03 PM By: Alric Quan Entered By: Alric Quan on 04/06/2016 09:29:45 [name] ([id number]) -------------------------------------------------------------------------------- Patient/Caregiver Education Details Patient Name: [name] Date of Service: 04/06/2016 9:30 AM Medical Record Patient Account Number: [id number] Number: Treating RN: Carolyne Fiscal, Debi [birth year] (82 y.o. Other Clinician: Date of Birth/Gender: Female) Treating [name], [place] Primary Care Physician: Brunetta Genera,  CHARLES Physician/Extender: G Referring Physician: CLINE, TODD Weeks in Treatment: 0 Education Assessment Education Provided To: Patient Education Topics Provided Welcome To [facility] [place]: Handouts: Welcome To [facility] [place] Methods: Explain/Verbal Wound/Skin Impairment: Handouts: Other: change dressing as ordered Methods: Demonstration, Explain/Verbal Responses: State content correctly Electronic Signature(s) Signed: 04/06/2016 5:39:03 PM By: Alric Quan Entered By: Alric Quan on 04/06/2016 10:55:01 [name] ([id number]) -------------------------------------------------------------------------------- Wound Assessment Details Patient Name: [name] Date of Service: 04/06/2016 9:30 AM Medical Record Patient Account Number: [id number] Number: Treating RN: Ahmed Prima [birth year] (82 y.o. Other Clinician: Date of Birth/Sex: Female) Treating [name], [place] Primary Care Physician: Elizebeth Koller Physician/Extender: G Referring Physician: CLINE, TODD Weeks in Treatment: 0 Wound Status Wound Number: 1 Primary Venous Leg Ulcer Etiology: Wound Location: Left Malleolus Wound Status: Open Wounding Event: Gradually Appeared Comorbid Cataracts, Hypertension, Date Acquired: 04/07/2015 History: Osteoarthritis Weeks Of Treatment: 0 Clustered Wound:  No Photos Photo Uploaded By: Alric Quan on 04/06/2016 16:33:04 Wound Measurements Length: (cm) 0.7 % Reduction in Width: (cm) 0.7 % Reduction in Depth: (cm) 0.2 Epithelializati Area: (cm) 0.385 Tunneling: Volume: (cm) 0.077 Area: Volume: on: None No Wound Description Classification: Partial Thickness Wound Margin: Thickened Exudate Amount: Large Exudate Type: Serous Exudate Color: amber Foul Odor After Cleansing: No Wound Bed Granulation Amount: None Present (0%) Exposed Structure Necrotic Amount: Large (67-100%) Fascia Exposed: No Necrotic Quality: Adherent Slough Fat Layer Exposed: No Whyte, [place] ([id number]) Tendon Exposed: No Muscle Exposed: No Joint Exposed: No Bone Exposed: No Limited to Skin Breakdown Periwound Skin Texture Texture Color No Abnormalities Noted: No No Abnormalities Noted: No Localized Edema: Yes Erythema: Yes Erythema Location: Circumferential Moisture No Abnormalities Noted: No Temperature / Pain Temperature: No Abnormality Tenderness on Palpation: Yes Wound Preparation Ulcer Cleansing: Rinsed/Irrigated with Saline Topical Anesthetic Applied: Other: lidocaine 4%, Treatment Notes Wound #1 (Left Malleolus) 1. Cleansed with: Clean wound with Normal Saline 2. Anesthetic Topical Lidocaine 4% cream to wound bed prior to debridement 3. Peri-wound Care: Barrier cream 4. Dressing Applied: Santyl Ointment 5. Secondary Dressing Applied Dry Gauze Foam Kerlix/Conform 7. Secured with Tape Notes netting Electronic Signature(s) Signed: 04/06/2016 5:39:03 PM By: Alric Quan Entered By: Alric Quan on 04/06/2016 10:11:40 [name] ([id number]) -------------------------------------------------------------------------------- Wound Assessment Details Patient Name: [name] Date of Service: 04/06/2016 9:30 AM Medical Record Patient Account Number: [id number] Number: Treating RN: Ahmed Prima 12/13/1918 (82 y.o. Other Clinician: Date of Birth/Sex: Female) Treating [name], [place] Primary Care Physician: Elizebeth Koller Physician/Extender: G Referring Physician: CLINE, TODD Weeks in Treatment: 0 Wound Status Wound Number: 2 Primary To be determined Etiology: Wound Location: Left Toe Fifth - Lateral Wound Status: Open Wounding Event: Gradually Appeared Comorbid Cataracts, Hypertension, Date Acquired: 04/07/2015 History: Osteoarthritis Weeks Of Treatment: 0 Clustered Wound: No Photos Photo Uploaded By: Alric Quan on 04/06/2016 16:33:05 Wound Measurements Length: (cm) 0.3 % Reduction in Width: (cm) 0.2 % Reduction in Depth: (cm) 0.2 Epithelializati Area: (cm) 0.047 Tunneling: Volume: (cm) 0.009 Undermining: Area: Volume: on: None No No Wound Description Classification: Partial Thickness Wound Margin: Thickened Exudate Amount: Large Exudate Type: Serous Exudate Color: amber Foul Odor After Cleansing: No Wound Bed Granulation Amount: None Present (0%) Exposed Structure Necrotic Amount: Large (67-100%) Fascia Exposed: No Necrotic Quality: Adherent Slough Fat Layer Exposed: No Campanelli, [place] ([id number]) Tendon Exposed: No Muscle Exposed: No Joint Exposed: No Bone Exposed: No Limited to Skin Breakdown Periwound Skin Texture Texture Color No Abnormalities Noted: No No Abnormalities Noted: No Localized Edema: Yes Erythema: Yes Erythema Location:  Circumferential Moisture No Abnormalities Noted: No Temperature / Pain Temperature: No Abnormality Tenderness on Palpation: Yes Wound Preparation Ulcer Cleansing: Rinsed/Irrigated with Saline Topical Anesthetic Applied: Other: lidocaine 4%, Treatment Notes Wound #2 (Left, Lateral Toe Fifth) 1. Cleansed with: Clean wound with Normal Saline 2. Anesthetic Topical Lidocaine 4% cream to wound bed prior to debridement 3. Peri-wound Care: Barrier cream 4. Dressing Applied: Santyl  Ointment 5. Secondary Dressing Applied Dry Gauze Foam Kerlix/Conform 7. Secured with Tape Notes netting Electronic Signature(s) Signed: 04/06/2016 5:39:03 PM By: Alric Quan Entered By: Alric Quan on 04/06/2016 10:14:33 [name] ([id number]) -------------------------------------------------------------------------------- Wound Assessment Details Patient Name: [name] Date of Service: 04/06/2016 9:30 AM Medical Record Patient Account Number: [id number] Number: Treating RN: Ahmed Prima [birth year] (82 y.o. Other Clinician: Date of Birth/Sex: Female) Treating [name], [place] Primary Care Physician: Elizebeth Koller Physician/Extender: G Referring Physician: CLINE, TODD Weeks in Treatment: 0 Wound Status Wound Number: 3 Primary Venous Leg Ulcer Etiology: Wound Location: Right Lower Leg - Medial Wound Status: Open Wounding Event: Gradually Appeared Comorbid Cataracts, Hypertension, Date Acquired: 04/06/2016 History: Osteoarthritis Weeks Of Treatment: 0 Clustered Wound: No Photos Photo Uploaded By: Alric Quan on 04/06/2016 16:33:30 Wound Measurements Length: (cm) 0.5 % Reduction in Width: (cm) 0.3 % Reduction in Depth: (cm) 0.1 Epithelializati Area: (cm) 0.118 Tunneling: Volume: (cm) 0.012 Undermining: Area: Volume: on: None No No Wound Description Classification: Partial Thickness Wound Margin: Flat and Intact Exudate Amount: Large Exudate Type: Serous Exudate Color: amber Wound Bed Granulation Amount: None Present (0%) Exposed Structure Necrotic Amount: Large (67-100%) Fascia Exposed: No Necrotic Quality: Adherent Slough Fat Layer Exposed: No Ducre, Larisa ([id number]) Tendon Exposed: No Muscle Exposed: No Joint Exposed: No Bone Exposed: No Limited to Skin Breakdown Periwound Skin Texture Texture Color No Abnormalities Noted: No No Abnormalities Noted: No Localized Edema: Yes Erythema:  Yes Erythema Location: Circumferential Moisture No Abnormalities Noted: No Temperature / Pain Temperature: No Abnormality Tenderness on Palpation: Yes Wound Preparation Ulcer Cleansing: Rinsed/Irrigated with Saline Topical Anesthetic Applied: Other: lidocaine 4%, Electronic Signature(s) Signed: 04/06/2016 5:39:03 PM By: Alric Quan Entered By: Alric Quan on 04/06/2016 10:17:05 [name] ([id number]) -------------------------------------------------------------------------------- Vitals Details Patient Name: [name] Date of Service: 04/06/2016 9:30 AM Medical Record Patient Account Number: [id number] Number: Treating RN: Ahmed Prima [birth year] (82 y.o. Other Clinician: Date of Birth/Sex: Female) Treating [name], [name] Primary Care Physician: Elizebeth Koller Physician/Extender: G Referring Physician: CLINE, TODD Weeks in Treatment: 0 Vital Signs Time Taken: 09:29 Temperature (F): 98.3 Height (in): 64 Pulse (bpm): 78 Source: Stated Respiratory Rate (breaths/min): 16 Weight (lbs): 158 Blood Pressure (mmHg): 174/62 Source: Stated Reference Range: 80 - 120 mg / dl Body Mass Index (BMI): 27.1 Electronic Signature(s) Signed: 04/06/2016 5:39:03 PM By: Alric Quan Entered By: Alric Quan on 04/06/2016 09:33:06

## 2016-04-07 NOTE — Progress Notes (Signed)
DAHLIANA, [name] ([id number]) Visit Report for 04/06/2016 Chief Complaint Document Details Patient Name: [name], [name] Date of Service: 04/06/2016 9:30 AM Medical Record Patient Account Number: [id number] Number: Treating RN: Ahmed Prima April 15, 1919 (82 y.o. Other Clinician: Date of Birth/Sex: Female) Treating ROBSON, MICHAEL Primary Care Physician/Extender: Windell Moment Physician: Referring Physician: CLINE, TODD Weeks in Treatment: 0 Information Obtained from: Patient Chief Complaint 04/06/16; the patient is here for review of wounds on the left lateral ankle and left great toe that been present for a year Electronic Signature(s) Signed: 04/06/2016 4:24:59 PM By: [name] MD Entered By: [name] on 04/06/2016 11:26:16 [name] ([id number]) -------------------------------------------------------------------------------- Debridement Details Patient Name: [name] Date of Service: 04/06/2016 9:30 AM Medical Record Patient Account Number: [id number] Number: Treating RN: Carolyne Fiscal, Debi 12-09-1918 (82 y.o. Other Clinician: Date of Birth/Sex: Female) Treating ROBSON, MICHAEL Primary Care Physician/Extender: Windell Moment Physician: Referring Physician: CLINE, TODD Weeks in Treatment: 0 Debridement Performed for Wound #1 Left Malleolus Assessment: Performed By: Physician Ricard Dillon, MD Debridement: Debridement Pre-procedure Yes Verification/Time Out Taken: Start Time: 10:43 Pain Control: Other : lidocaine 4% cream Level: Skin/Subcutaneous Tissue Total Area Debrided (L x 0.7 (cm) x 0.7 (cm) = 0.49 (cm) W): Tissue and other Viable, Non-Viable, Exudate, Fibrin/Slough, Subcutaneous material debrided: Instrument: Curette Bleeding: Minimum Hemostasis Achieved: Pressure End Time: 10:45 Procedural Pain: 0 Post Procedural Pain: 0 Response to Treatment: Procedure was tolerated well Post Debridement  Measurements of Total Wound Length: (cm) 0.7 Width: (cm) 0.7 Depth: (cm) 0.3 Volume: (cm) 0.115 Post Procedure Diagnosis Same as Pre-procedure Electronic Signature(s) Signed: 04/06/2016 4:24:59 PM By: [name] MD Signed: 04/06/2016 5:39:03 PM By: Alric Quan Entered By: [name] on 04/06/2016 11:20:23 [name] ([id number]) [name] ([id number]) -------------------------------------------------------------------------------- Debridement Details Patient Name: [name] Date of Service: 04/06/2016 9:30 AM Medical Record Patient Account Number: [id number] Number: Treating RN: Ahmed Prima 31-Aug-1919 (82 y.o. Other Clinician: Date of Birth/Sex: Female) Treating ROBSON, MICHAEL Primary Care Physician/Extender: Windell Moment Physician: Referring Physician: CLINE, TODD Weeks in Treatment: 0 Debridement Performed for Wound #2 Left,Lateral Toe Fifth Assessment: Performed By: Physician Ricard Dillon, MD Debridement: Debridement Pre-procedure Yes Verification/Time Out Taken: Start Time: 10:45 Pain Control: Other : lidocaine 4% cream Level: Skin/Subcutaneous Tissue Total Area Debrided (L x 0.3 (cm) x 0.2 (cm) = 0.06 (cm) W): Tissue and other Viable, Non-Viable, Exudate, Fibrin/Slough, Subcutaneous material debrided: Instrument: Curette Bleeding: Minimum Hemostasis Achieved: Pressure End Time: 10:46 Procedural Pain: 0 Post Procedural Pain: 0 Response to Treatment: Procedure was tolerated well Post Debridement Measurements of Total Wound Length: (cm) 0.3 Width: (cm) 0.2 Depth: (cm) 0.2 Volume: (cm) 0.009 Post Procedure Diagnosis Same as Pre-procedure Electronic Signature(s) Signed: 04/06/2016 4:24:59 PM By: [name] MD Signed: 04/06/2016 5:39:03 PM By: Alric Quan Entered By: [name] on 04/06/2016 11:20:49 [name] ([id number]) [name]  ([id number]) -------------------------------------------------------------------------------- Debridement Details Patient Name: [name] Date of Service: 04/06/2016 9:30 AM Medical Record Patient Account Number: [id number] Number: Treating RN: Ahmed Prima 1918/12/30 (82 y.o. Other Clinician: Date of Birth/Sex: Female) Treating ROBSON, MICHAEL Primary Care Physician/Extender: Windell Moment Physician: Referring Physician: CLINE, TODD Weeks in Treatment: 0 Debridement Performed for Wound #3 Right,Anterior Lower Leg Assessment: Performed By: Physician Ricard Dillon, MD Debridement: Debridement Pre-procedure Yes Verification/Time Out Taken: Start Time: 10:47 Pain Control: Other : lidocaine 4% cream Level: Skin/Subcutaneous Tissue Total Area Debrided (L x 0.5 (cm) x 0.3 (cm) = 0.15 (cm) W): Tissue and other Viable, Non-Viable, Exudate, Fibrin/Slough,  Subcutaneous material debrided: Instrument: Curette Bleeding: Minimum Hemostasis Achieved: Pressure End Time: 10:48 Procedural Pain: 0 Post Procedural Pain: 0 Response to Treatment: Procedure was tolerated well Post Debridement Measurements of Total Wound Length: (cm) 0.5 Width: (cm) 0.5 Depth: (cm) 0.1 Volume: (cm) 0.02 Post Procedure Diagnosis Same as Pre-procedure Electronic Signature(s) Signed: 04/06/2016 4:24:59 PM By: [name] MD Signed: 04/06/2016 5:39:03 PM By: Alric Quan Entered By: [name] on 04/06/2016 11:21:50 [name] ([id number]) [name] ([id number]) -------------------------------------------------------------------------------- HPI Details Patient Name: [name] Date of Service: 04/06/2016 9:30 AM Medical Record Patient Account Number: [id number] Number: Treating RN: Ahmed Prima December 22, 1918 (82 y.o. Other Clinician: Date of Birth/Sex: Female) Treating ROBSON, MICHAEL Primary Care Physician/Extender: Windell Moment Physician: Referring Physician: CLINE, TODD Weeks in Treatment: 0 History of Present Illness HPI Description: 04/06/16; this is a 82 year old woman who arrives accompanied by 2 daughters for a wound on her left ankle and her left fifth toe. These have apparently been present for a year. I'm not quite certain how she came to this clinic however she was being followed by Sharlotte Alamo her podiatrist for these wounds. She was also referred to [facility] and they apparently did a test presumably arterial studies although we don't have any of these results and we couldn't get through to the office today. The family but has been applying a combination of Bactroban and a light bandage and perhaps more recently Silvadene cream. She did have an x-ray of the foot roughly 6 months ago at the podiatry office the family was unaware that if there were any abnormalities. Apparently they have not seen any healing here. Our intake nurse noted a slight skin tear on the right anterior lower leg. Nobody seemed aware of this. ABIs calculated in this clinic was 0.3 on the right and 0.4 on the left I have reviewed things [facility]. There is very little information on this patient. She apparently follows in current total clinic which we don't have information from. She has mentioned already been to a [facility]. She has a history of hypothyroidism, nephrolithiasis arthritis and has had a previous mastectomy. Electronic Signature(s) Signed: 04/06/2016 4:24:59 PM By: [name] MD Entered By: [name] on 04/06/2016 11:34:36 [name] ([id number]) -------------------------------------------------------------------------------- Physical Exam Details Patient Name: [name] Date of Service: 04/06/2016 9:30 AM Medical Record Patient Account Number: [id number] Number: Treating RN: Ahmed Prima June 21, 1919 (82 y.o. Other Clinician: Date of Birth/Sex: Female)  Treating ROBSON, MICHAEL Primary Care Physician/Extender: Windell Moment Physician: Referring Physician: CLINE, TODD Weeks in Treatment: 0 Constitutional Patient is hypertensive.. Pulse regular and within target range for patient.Marland Kitchen Respirations regular, non-labored and within target range.. Temperature is normal and within the target range for the patient.. The patient is in no distress. Noticeable essential tremor. Respiratory Respiratory effort is easy and symmetric bilaterally. Rate is normal at rest and on room air.. Bilateral breath sounds are clear and equal in all lobes with no wheezes, rales or rhonchi.. Cardiovascular Heart rhythm and rate regular, without murmur or gallop.. Femoral pulses are faint bilaterally. Pedal pulses absent bilaterally.. Gastrointestinal (GI) Somewhat distended but no masses. No liver or spleen enlargement or tenderness.. Lymphatic None palpable in the popliteal or inguinal area. Musculoskeletal No involvement of the joints at her ankle or the small joints of her left fifth toe. Neurological Seems to be normal to light touch. Perhaps some slight vibration sense abnormality. Psychiatric No evidence of depression, anxiety, or agitation. Calm, cooperative, and communicative.  Appropriate interactions and affect.. Notes Wound exam; the 2 major areas she consult of [place] were both on bony prominences. Firstly over the left lateral malleolus and secondarily over the PIP joint of the left fifth toe dorsally. Both of these had a punched-out appearance. Both of them had nonviable tissue on the base that required a difficult debridement to remove. These are small but deep wounds. 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 She also has a superficial area on the right anterior leg. Nobody knew anything about this including the patient. Electronic Signature(s) Signed: 04/06/2016 4:24:59 PM By: [name]  MD [place], Estill Bamberg ([id number]) Entered By: [name] on 04/06/2016 11:38:20 [name] ([id number]) -------------------------------------------------------------------------------- Physician Orders Details Patient Name: [name] Date of Service: 04/06/2016 9:30 AM Medical Record Patient Account Number: [id number] Number: Treating RN: Ahmed Prima 08-04-1919 (82 y.o. Other Clinician: Date of Birth/Sex: Female) Treating ROBSON, MICHAEL Primary Care Physician/Extender: Windell Moment Physician: Referring Physician: CLINE, TODD Weeks in Treatment: 0 Verbal / Phone Orders: Yes Clinician: Pinkerton, Debi Read Back and Verified: Yes Diagnosis Coding Wound Cleansing Wound #1 Left Malleolus o Clean wound with Normal Saline. - for clinic use o Cleanse wound with mild soap and water o May Shower, gently pat wound dry prior to applying new dressing. Wound #2 Left,Lateral Toe Fifth o Clean wound with Normal Saline. - for clinic use o Cleanse wound with mild soap and water o May Shower, gently pat wound dry prior to applying new dressing. Wound #3 Right,Anterior Lower Leg o Clean wound with Normal Saline. - for clinic use o Cleanse wound with mild soap and water o May Shower, gently pat wound dry prior to applying new dressing. Anesthetic Wound #1 Left Malleolus o Topical Lidocaine 4% cream applied to wound bed prior to debridement - for clinic use Wound #2 Left,Lateral Toe Fifth o Topical Lidocaine 4% cream applied to wound bed prior to debridement - for clinic use Wound #3 Right,Anterior Lower Leg o Topical Lidocaine 4% cream applied to wound bed prior to debridement - for clinic use Skin Barriers/Peri-Wound Care Wound #1 Left Malleolus o Barrier cream Wound #2 Left,Lateral Toe Fifth o Barrier cream Wound #3 Right,Anterior Lower Leg [place], Denasia ([id number]) o Skin Prep Primary Wound Dressing Wound #1 Left  Malleolus o Santyl Ointment Wound #2 Left,Lateral Toe Fifth o Santyl Ointment Secondary Dressing Wound #1 Left Malleolus o Gauze and Kerlix/Conform - netting, tape o Foam Wound #2 Left,Lateral Toe Fifth o Gauze and Kerlix/Conform o Foam Wound #3 Right,Anterior Lower Leg o Boardered Foam Dressing Dressing Change Frequency Wound #1 Left Malleolus o Change dressing every other day. Wound #2 Left,Lateral Toe Fifth o Change dressing every other day. Wound #3 Right,Anterior Lower Leg o Change dressing every other day. Follow-up Appointments Wound #1 Left Malleolus o Return Appointment in 1 week. Wound #2 Left,Lateral Toe Fifth o Return Appointment in 1 week. Wound #3 Right,Anterior Lower Leg o Return Appointment in 1 week. Edema Control Wound #1 Left Malleolus o Elevate legs to the level of the heart and pump ankles as often as possible o Other: - compression stockings Wound #2 Left,Lateral Toe Fifth [place], Tracey ([id number]) o Elevate legs to the level of the heart and pump ankles as often as possible o Other: - compression stockings Wound #3 Right,Anterior Lower Leg o Elevate legs to the level of the heart and pump ankles as often as possible o Other: - compression stockings Additional Orders / Instructions Wound #  1 Left Malleolus o Increase protein intake. Wound #2 Left,Lateral Toe Fifth o Increase protein intake. Wound #3 Right,Anterior Lower Leg o Increase protein intake. Medications-please add to medication list. Wound #1 Left Malleolus o Santyl Enzymatic Ointment o Other: - Vitamin C, Zinc, Multivitamins Wound #2 Left,Lateral Toe Fifth o Santyl Enzymatic Ointment o Other: - Vitamin C, Zinc, Multivitamins Wound #3 Right,Anterior Lower Leg o Santyl Enzymatic Ointment o Other: - Vitamin C, Zinc, Multivitamins Radiology o X-ray, foot - right o X-ray, ankle - right Electronic Signature(s) Signed:  04/06/2016 4:24:59 PM By: [name] MD Signed: 04/06/2016 5:39:03 PM By: Alric Quan Entered By: Alric Quan on 04/06/2016 10:58:50 [name] ([id number]) -------------------------------------------------------------------------------- Problem List Details Patient Name: [name] Date of Service: 04/06/2016 9:30 AM Medical Record Patient Account Number: [id number] Number: Treating RN: Carolyne Fiscal, Debi 1918-12-09 (82 y.o. 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 Other Clinician: Date of Birth/Sex: Female) Treating ROBSON, MICHAEL Primary Care Physician/Extender: Windell Moment Physician: Referring Physician: CLINE, TODD Weeks in Treatment: 0 Subjective Chief Complaint Information obtained from Patient 04/06/16; the patient is here for review of wounds on  the left lateral ankle and left great toe that been present for a year History of Present Illness (HPI) 04/06/16; this is a 82 year old woman who arrives accompanied by 2 daughters for a wound on her left ankle and her left fifth toe. These have apparently been present for a year. I'm not quite certain how she came to this clinic however she was being followed by Sharlotte Alamo her podiatrist for these wounds. She was also referred to [facility] and they apparently did a test presumably arterial studies although we don't have any of these results and we couldn't get through to the office today. The family but has been applying a combination of Bactroban and a light bandage and perhaps more recently Silvadene cream. She did have an x-ray of the foot roughly 6 months ago at the podiatry office the family was unaware that if there were any abnormalities. Apparently they have not seen any healing here. Our intake nurse noted a slight skin tear on the right anterior lower leg. Nobody seemed aware of this. ABIs calculated in this clinic was 0.3 on the right and 0.4 on the left I have reviewed things [facility]. There is very little information on this patient. She apparently follows in current total clinic which we don't have information from. She has mentioned already been to a [facility]. She has a history of hypothyroidism, nephrolithiasis arthritis and has had a previous mastectomy. Wound History Patient presents with 2 open wounds that have been present for approximately 1 year. Patient has been  treating wounds in the following manner: silvadene and ABT ointment. Laboratory tests have not been performed in the last month. Patient reportedly has not tested positive for an antibiotic resistant organism. Patient reportedly has not tested positive for osteomyelitis. Patient reportedly has had testing performed to evaluate circulation in the legs. Patient experiences the following  problems associated with their wounds: swelling. Patient History Information obtained from Patient. Allergies codeine (Severity: Severe, Reaction: sick on stomach) [place], Denora ([id number]) Family History Diabetes - Mother, Heart Disease - Siblings, Stroke - Siblings, No family history of Cancer, Hereditary Spherocytosis, Hypertension, Kidney Disease, Lung Disease, Seizures, Thyroid Problems, Tuberculosis. Social History Never smoker, Marital Status - Widowed, Alcohol Use - Never, Drug Use - No History, Caffeine Use - Daily. Medical History Eyes Patient has history of Cataracts - had surgery Cardiovascular Patient has history of Hypertension Musculoskeletal Patient has history of Osteoarthritis Oncologic Denies history of Received Chemotherapy, Received Radiation Review of Systems (ROS) Constitutional Symptoms ([place]) The patient has no complaints or symptoms. Eyes macular degeneration Ear/Nose/Mouth/Throat [facility] Hematologic/Lymphatic The patient has no complaints or symptoms. Respiratory The patient has no complaints or symptoms. Gastrointestinal The patient has no complaints or symptoms. Endocrine Complains or has symptoms of Thyroid disease. Genitourinary Complains or has symptoms of Incontinence/dribbling, hx kidney stones Immunological The patient has no complaints or symptoms. Integumentary (Skin) Complains or has symptoms of Wounds. Neurologic The patient has no complaints or symptoms. Oncologic hx breast cancer right breast removed Psychiatric The patient has no complaints or symptoms. SHARETTA, HALLERAN ([id number]) Objective Constitutional Patient is hypertensive.. Pulse regular and within target range for patient.Marland Kitchen Respirations regular, non-labored and within target range.. Temperature is normal and within the target range for the patient.. The patient is in no distress. Noticeable essential tremor. Vitals Time Taken: 9:29 AM, Height: 64 in,  Source: Stated, Weight: 158 lbs, Source: Stated, BMI: 27.1, Temperature: 98.3 F, Pulse: 78 bpm, Respiratory Rate: 16 breaths/min, Blood Pressure: 174/62 mmHg. Respiratory Respiratory effort is easy and symmetric bilaterally. Rate is normal at rest and on room air.. Bilateral breath sounds are clear and equal in all lobes with no wheezes, rales or rhonchi.. Cardiovascular Heart rhythm and rate regular, without murmur or gallop.. Femoral pulses are faint bilaterally. Pedal pulses absent bilaterally.. Gastrointestinal (GI) Somewhat distended but no masses. No liver or spleen enlargement or tenderness.. Lymphatic None palpable in the popliteal or inguinal area. Musculoskeletal No involvement of the joints at her ankle or the small joints of her left fifth toe. Neurological Seems to be normal to light touch. Perhaps some slight vibration sense abnormality. Psychiatric No evidence of depression, anxiety, or agitation. Calm, cooperative, and communicative. Appropriate interactions and affect.. General Notes: Wound exam; the 2 major areas she consult of [place] were both on bony prominences. Firstly over the left lateral malleolus and secondarily over the PIP joint of the left fifth toe dorsally. Both of these had a punched-out appearance. Both of them had nonviable tissue on the base that required a difficult debridement to remove. These are small but deep wounds. 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 She also has a superficial area on the right anterior leg. Nobody knew anything about this including the patient. Integumentary (Hair, Skin) Wound #1 status is Open. Original cause of wound was Gradually Appeared. The wound is located on the Left Malleolus. The wound measures 0.7cm length x 0.7cm width x 0.2cm depth; 0.385cm^2 area and 0.077cm^3 volume. The wound is limited to skin  breakdown. There is no tunneling noted. There is a large Tusing,  Dorcas ([id number]) amount of serous drainage noted. The wound margin is thickened. There is no granulation within the wound bed. There is a large (67-100%) amount of necrotic tissue within the wound bed including Adherent Slough. The periwound skin appearance exhibited: Localized Edema, Erythema. The surrounding wound skin color is noted with erythema which is circumferential. Periwound temperature was noted as No Abnormality. The periwound has tenderness on palpation. Wound #2 status is Open. Original cause of wound was Gradually Appeared. The wound is located on the Left,Lateral Toe Fifth. The wound measures 0.3cm length x 0.2cm width x 0.2cm depth; 0.047cm^2 area and 0.009cm^3 volume. The wound is limited to skin breakdown. There is no tunneling or undermining noted. There is a large amount of serous drainage noted. The wound margin is thickened. There is no granulation within the wound bed. There is a large (67-100%) amount of necrotic tissue within the wound bed including Adherent Slough. The periwound skin appearance exhibited: Localized Edema, Erythema. The surrounding wound skin color is noted with erythema which is circumferential. Periwound temperature was noted as No Abnormality. The periwound has tenderness on palpation. Wound #3 status is Open. Original cause of wound was Gradually Appeared. The wound is located on the Right,Anterior Lower Leg. The wound measures 0.5cm length x 0.3cm width x 0.1cm depth; 0.118cm^2 area and 0.012cm^3 volume. The wound is limited to skin breakdown. There is no tunneling or undermining noted. There is a large amount of serous drainage noted. The wound margin is flat and intact. There is no granulation within the wound bed. There is a large (67-100%) amount of necrotic tissue within the wound bed including Adherent Slough. The periwound skin appearance exhibited: Localized Edema, Erythema. The surrounding wound skin color is noted with erythema which is  circumferential. Periwound temperature was noted as No Abnormality. The periwound has tenderness on palpation. 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 There was a Skin/Subcutaneous Tissue Debridement [id number]) debridement with total area of 0.49 sq cm performed by Ricard Dillon, MD. with the following instrument(s): Curette to remove Viable and Non-Viable tissue/material including Exudate, Fibrin/Slough, and Subcutaneous after achieving pain control using Other (lidocaine 4% cream). A time out was conducted prior to the start of the procedure. A Minimum amount of bleeding was controlled [place], Makara ([id number]) with Pressure. The procedure was tolerated well with a pain level of 0 throughout and a pain level of 0 following the procedure. Post Debridement Measurements: 0.7cm length x 0.7cm width x 0.3cm depth; 0.115cm^3 volume. Post procedure Diagnosis Wound #1: Same as Pre-Procedure Wound #2 Wound #2 is a To be determined located on the Left,Lateral Toe Fifth . There was a Skin/Subcutaneous Tissue Debridement [id number]) debridement with total area of 0.06 sq cm performed by Ricard Dillon, MD. with the following instrument(s): Curette to remove Viable and Non-Viable tissue/material including Exudate, Fibrin/Slough, and Subcutaneous after achieving pain control using Other (lidocaine 4% cream). A time out was conducted prior to the start of the procedure. A Minimum amount of bleeding was controlled with Pressure.  The procedure was tolerated well with a pain  level of 0 throughout and a pain level of 0 following the procedure. Post Debridement Measurements: 0.3cm length x 0.2cm width x 0.2cm depth; 0.009cm^3 volume. Post procedure Diagnosis Wound #2: Same as Pre-Procedure Wound #3 Wound #3 is a Venous Leg Ulcer located on the Right,Anterior Lower Leg . There was a Skin/Subcutaneous Tissue Debridement [id number]) debridement with total area of 0.15 sq cm performed by Ricard Dillon, MD. with the following instrument(s): Curette to remove Viable and Non-Viable tissue/material including Exudate, Fibrin/Slough, and Subcutaneous after achieving pain control using Other (lidocaine 4% cream). A time out was conducted prior to the start of the procedure. A Minimum amount of bleeding was controlled with Pressure. The procedure was tolerated well with a pain level of 0 throughout and a pain level of 0 following the procedure. Post Debridement Measurements: 0.5cm length x 0.5cm width x 0.1cm depth; 0.02cm^3 volume. Post procedure Diagnosis Wound #3: Same as Pre-Procedure Plan Wound Cleansing: Wound #1 Left Malleolus: Clean wound with Normal Saline. - for clinic use Cleanse wound with mild soap and water May Shower, gently pat wound dry prior to applying new dressing. Wound #2 Left,Lateral Toe Fifth: Clean wound with Normal Saline. - for clinic use Cleanse wound with mild soap and water May Shower, gently pat wound dry prior to applying new dressing. Wound #3 Right,Anterior Lower Leg: Clean wound with Normal Saline. - for clinic use Cleanse wound with mild soap and water May Shower, gently pat wound dry prior to applying new dressing. Anesthetic: MEUY, BINETTI ([id number]) Wound #1 Left Malleolus: Topical Lidocaine 4% cream applied to wound bed prior to debridement - for clinic use Wound #2 Left,Lateral Toe Fifth: Topical Lidocaine 4% cream applied to wound bed prior to debridement - for clinic use Wound #3 Right,Anterior Lower Leg: Topical  Lidocaine 4% cream applied to wound bed prior to debridement - for clinic use Skin Barriers/Peri-Wound Care: Wound #1 Left Malleolus: Barrier cream Wound #2 Left,Lateral Toe Fifth: Barrier cream Wound #3 Right,Anterior Lower Leg: Skin Prep Primary Wound Dressing: Wound #1 Left Malleolus: Santyl Ointment Wound #2 Left,Lateral Toe Fifth: Santyl Ointment Secondary Dressing: Wound #1 Left Malleolus: Gauze and Kerlix/Conform - netting, tape Foam Wound #2 Left,Lateral Toe Fifth: Gauze and Kerlix/Conform Foam Wound #3 Right,Anterior Lower Leg: Boardered Foam Dressing Dressing Change Frequency: Wound #1 Left Malleolus: Change dressing every other day. Wound #2 Left,Lateral Toe Fifth: Change dressing every other day. Wound #3 Right,Anterior Lower Leg: Change dressing every other day. Follow-up Appointments: Wound #1 Left Malleolus: Return Appointment in 1 week. Wound #2 Left,Lateral Toe Fifth: Return Appointment in 1 week. Wound #3 Right,Anterior Lower Leg: Return Appointment in 1 week. Edema Control: Wound #1 Left Malleolus: Elevate legs to the level of the heart and pump ankles as often as possible Other: - compression stockings Wound #2 Left,Lateral Toe Fifth: Elevate legs to the level of the heart and pump ankles as often as possible Other: - compression stockings Wound #3 Right,Anterior Lower Leg: Elevate legs to the level of the heart and pump ankles as often as possible Other: - compression stockings Amoroso, Zarayah ([id number]) Additional Orders / Instructions: Wound #1 Left Malleolus: Increase protein intake. Wound #2 Left,Lateral Toe Fifth: Increase protein intake. Wound #3 Right,Anterior Lower Leg: Increase protein intake. Medications-please add to medication list.: Wound #1 Left Malleolus: Santyl Enzymatic Ointment Other: - Vitamin C, Zinc, Multivitamins Wound #2 Left,Lateral Toe Fifth: Santyl Enzymatic Ointment Other: - Vitamin C, Zinc,  Multivitamins Wound #3  Right,Anterior Lower Leg: Santyl Enzymatic Ointment Other: - Vitamin C, Zinc, Multivitamins Radiology ordered were: X-ray, foot - right, X-ray, ankle - right 1 we applied santyl to both wounds 2 xray of the foot wound seem in order 3 await hopefully arterial studies from vein and vascular 4 She has some degree of venous insufficiency however I don't beleive this is the cause of this Addendum we have received information from vein and vascular. 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 At that point I think they were attributing her ulcer on the left ankle to venous insufficiency. I see no mention of her arterial supply. She is going to need arterial Dopplers and ABIs. Addendum #2 with diligence of our staff we've been able to obtain her ABIs. These were done in November 2016. This showed ABI in the right and 0.76 and on the left at 0.62. She had triphasic waves that the common femoral and distal femoral artery but monophasic in the superficial artery and all segments. 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 The interpretation was greater than 50% stenosis of the right superficial femoral artery and greater than 50% stenosis of the left superficial femoral artery and bilateral tibial peroneal artery disease AUDRINNA, HOFFMEIER ([id number]) Electronic Signature(s) Signed: 04/06/2016 4:24:59 PM By: [name] MD Entered By: [name] on 04/06/2016 16:17:01 [name] ([id number]) -------------------------------------------------------------------------------- ROS/PFSH Details Patient Name: [name] Date of Service: 04/06/2016 9:30 AM Medical Record Patient Account Number: [id number] Number: Treating RN: Carolyne Fiscal, Debi June 29, 1919 (82 y.o. Other Clinician: Date of  Birth/Sex: Female) Treating ROBSON, MICHAEL Primary Care Physician/Extender: Windell Moment Physician: Referring Physician: CLINE, TODD Weeks in Treatment: 0 Information Obtained From Patient Wound History Do you currently have one or more open woundso Yes How many open wounds do you currently haveo 2 Approximately how long have you had your woundso 1 year How have you been treating your wound(s) until nowo silvadene and ABT ointment Has your wound(s) ever healed and then re-openedo No Have you had any lab work done in the past montho No Have you tested positive for an antibiotic resistant organism (MRSA, No VRE)o Have you tested positive for osteomyelitis (bone infection)o No Have you had any tests for circulation on your legso Yes Who ordered the testo Dr. Jens Som podiatry Where was the test doneo [facility] Dr. Lucky Cowboy Have you had other problems associated with your woundso Swelling Endocrine Complaints and Symptoms: Positive for: Thyroid disease Genitourinary Complaints and Symptoms: Positive for: Incontinence/dribbling Review of System Notes: hx kidney stones Integumentary (Skin) Complaints and Symptoms: Positive for: Wounds Constitutional Symptoms (General Health) EMANUEL, ZOCH ([id number]) Complaints and Symptoms: No Complaints or Symptoms Eyes Complaints and Symptoms: Review of System Notes: macular degeneration Medical History: Positive for: Cataracts - had surgery Ear/Nose/Mouth/Throat Complaints and Symptoms: Review of System Notes: HOH Hematologic/Lymphatic Complaints and Symptoms: No Complaints or Symptoms Respiratory Complaints and Symptoms: No Complaints or Symptoms Cardiovascular Medical History: Positive for: Hypertension Gastrointestinal Complaints and Symptoms: No Complaints or Symptoms Immunological Complaints and Symptoms: No Complaints or Symptoms Musculoskeletal Medical History: Positive for: Osteoarthritis Neurologic Complaints  and Symptoms: No Complaints or Symptoms Bossard, Jennel ([id number]) Oncologic Complaints and Symptoms: Review of System Notes: hx breast cancer right breast removed Medical History: Negative for: Received Chemotherapy; Received Radiation Psychiatric Complaints and Symptoms: No Complaints or Symptoms HBO Extended History Items Eyes: Cataracts Family and  Social History Cancer: No; Diabetes: Yes - Mother; Heart Disease: Yes - Siblings; Hereditary Spherocytosis: No; Hypertension: No; Kidney Disease: No; Lung Disease: No; Seizures: No; Stroke: Yes - Siblings; Thyroid Problems: No; Tuberculosis: No; Never smoker; Marital Status - Widowed; Alcohol Use: Never; Drug Use: No History; Caffeine Use: Daily; Financial Concerns: No; Food, Clothing or Shelter Needs: No; Support System Lacking: No; Transportation Concerns: No; Advanced Directives: No; Patient does not want information on Advanced Directives; Do not resuscitate: No; Living Will: No; Medical Power of Attorney: No Electronic Signature(s) Signed: 04/06/2016 4:24:59 PM By: [name] MD Signed: 04/06/2016 5:39:03 PM By: Alric Quan Entered By: Alric Quan on 04/06/2016 09:47:24 [name] ([id number]) -------------------------------------------------------------------------------- [place] Details Patient Name: [name] Date of Service: 04/06/2016 Medical Record Patient Account Number: [id number] Number: Treating RN: Ahmed Prima Mar 07, 1919 (82 y.o. 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 right calf limited to breakdown of skin Facility Procedures CPT4: Description Modifier Quantity Code [id number] - WOUND CARE VISIT-LEV 3 EST PT 1 CPT4: [id number] - DEB SUBQ TISSUE 20 SQ CM/< 1 ICD-10 Description Diagnosis I70.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 I70.243 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

## 2016-04-13 ENCOUNTER — Encounter: Payer: Medicare PPO | Attending: Internal Medicine | Admitting: Internal Medicine

## 2016-04-13 DIAGNOSIS — L97523 Non-pressure chronic ulcer of other part of left foot with necrosis of muscle: Secondary | ICD-10-CM | POA: Insufficient documentation

## 2016-04-13 DIAGNOSIS — I70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: Secondary | ICD-10-CM | POA: Insufficient documentation

## 2016-04-13 DIAGNOSIS — L97211 Non-pressure chronic ulcer of right calf limited to breakdown of skin: Secondary | ICD-10-CM | POA: Diagnosis not present

## 2016-04-13 DIAGNOSIS — I70243 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: Secondary | ICD-10-CM | POA: Insufficient documentation

## 2016-04-14 NOTE — Progress Notes (Signed)
WEYLYN, LOWHORN ([id number]) Visit Report for 04/13/2016 Arrival Information Details Patient Name: [name], [name] Date of Service: 04/13/2016 3:45 PM Medical Record Patient Account Number: [id number] Number: Treating RN: Ahmed Prima [birth year] (82 y.o. Other Clinician: Date of Birth/Sex: Female) Treating ROBSON, MICHAEL Primary Care Physician: Elizebeth Koller Physician/Extender: G Referring Physician: Laverle Hobby in Treatment: 1 Visit Information History Since Last Visit All ordered tests and consults were completed: No Patient Arrived: Ambulatory Added or deleted any medications: No Arrival Time: 16:02 Any new allergies or adverse reactions: No Accompanied By: daughter Had a fall or experienced change in No Transfer Assistance: None activities of daily living that may affect Patient Identification Verified: Yes risk of falls: Secondary Verification Process Yes Signs or symptoms of abuse/neglect since last No Completed: visito Patient Requires Transmission-Based No Hospitalized since last visit: No Precautions: Pain Present Now: Yes Patient Has Alerts: No Electronic Signature(s) Signed: 04/13/2016 5:31:40 PM By: Alric Quan Entered By: Alric Quan on 04/13/2016 16:02:44 [name] ([id number]) -------------------------------------------------------------------------------- Encounter Discharge Information Details Patient Name: [name] Date of Service: 04/13/2016 3:45 PM Medical Record Patient Account Number: [id number] Number: Treating RN: Ahmed Prima 12-31-1918 (82 y.o. Other Clinician: Date of Birth/Sex: Female) Treating ROBSON, MICHAEL Primary Care Physician: Elizebeth Koller Physician/Extender: G Referring Physician: Laverle Hobby in Treatment: 1 Encounter Discharge Information Items Discharge Pain Level: 0 Discharge Condition: Stable Ambulatory Status: Ambulatory Discharge Destination:  Home Transportation: Private Auto Accompanied By: daughter Schedule Follow-up Appointment: Yes Medication Reconciliation completed and provided to Patient/Care Yes [name]: Provided on Clinical Summary of Care: 04/13/2016 Form Type Recipient Paper Patient [facility] Electronic Signature(s) Signed: 04/13/2016 4:53:44 PM By: Ruthine Dose Entered By: Ruthine Dose on 04/13/2016 16:53:44 [name] ([id number]) -------------------------------------------------------------------------------- Lower Extremity Assessment Details Patient Name: [name] Date of Service: 04/13/2016 3:45 PM Medical Record Patient Account Number: [id number] Number: Treating RN: Ahmed Prima January 07, 1919 (82 y.o. Other Clinician: Date of Birth/Sex: Female) Treating ROBSON, MICHAEL Primary Care Physician: Elizebeth Koller Physician/Extender: G Referring Physician: Laverle Hobby in Treatment: 1 Vascular Assessment Pulses: Posterior Tibial Dorsalis Pedis Palpable: [Right:No] Doppler: [Right:Multiphasic] Extremity colors, hair growth, and conditions: Extremity Color: [Right:Mottled] Temperature of Extremity: [Right:Cool] Capillary Refill: [Right:> 3 seconds] Toe Nail Assessment Left: Right: Thick: No Discolored: No Deformed: No Improper Length and Hygiene: No Electronic Signature(s) Signed: 04/13/2016 5:31:40 PM By: Alric Quan Entered By: Alric Quan on 04/13/2016 [id number] [name] ([id number]) -------------------------------------------------------------------------------- Multi Wound Chart Details Patient Name: [name] Date of Service: 04/13/2016 3:45 PM Medical Record Patient Account Number: [id number] Number: Treating RN: Ahmed Prima 10-Mar-1919 (82 y.o. Other Clinician: Date of Birth/Sex: Female) Treating ROBSON, [place] Primary Care Physician: Elizebeth Koller Physician/Extender: G Referring Physician: Laverle Hobby  in Treatment: 1 Vital Signs Height(in): 64 Pulse(bpm): 87 Weight(lbs): 158 Blood Pressure 145/51 (mmHg): Body Mass Index(BMI): 27 Temperature(F): 97.7 Respiratory Rate 16 (breaths/min): Photos: [1:No Photos] [2:No Photos] [3:No Photos] Wound Location: [1:Left Malleolus] [2:Left Toe Fifth - Lateral] [3:Right, Anterior Lower Leg] Wounding Event: [1:Gradually Appeared] [2:Gradually Appeared] [3:Gradually Appeared] Primary Etiology: [1:Venous Leg Ulcer] [2:To be determined] [3:Venous Leg Ulcer] Comorbid History: [1:Cataracts, Hypertension, Osteoarthritis] [2:Cataracts, Hypertension, Osteoarthritis] [3:N/A] Date Acquired: [1:04/07/2015] [2:04/07/2015] [3:04/06/2016] Weeks of Treatment: [1:1] [2:1] [3:1] Wound Status: [1:Open] [2:Open] [3:Healed - Epithelialized] Measurements L x W x D 0.8x0.6x0.2 [2:0.3x0.3x0.3] [3:0x0x0] (cm) Area (cm) : [1:0.377] [2:0.071] [3:0] Volume (cm) : [1:0.075] [2:0.021] [3:0] % Reduction in Area: [1:2.10%] [2:-51.10%] [3:100.00%] % Reduction in Volume: 2.60% [2:-133.30%] [3:100.00%] Classification: [1:Partial Thickness] [2:Partial Thickness] [3:Partial Thickness] Exudate Amount: [  1:Large] [2:Large] [3:N/A] Exudate Type: [1:Serous] [2:Serous] [3:N/A] Exudate Color: [1:amber] [2:amber] [3:N/A] Wound Margin: [1:Thickened] [2:Thickened] [3:N/A] Granulation Amount: [1:None Present (0%)] [2:None Present (0%)] [3:N/A] Necrotic Amount: [1:Large (67-100%)] [2:Large (67-100%)] [3:N/A] Exposed Structures: [1:Fascia: No Fat: No Tendon: No Muscle: No Joint: No Bone: No] [2:Fascia: No Fat: No Tendon: No Muscle: No Joint: No Bone: No] [3:N/A] Limited to Skin Limited to Skin Breakdown Breakdown Epithelialization: None None N/A Periwound Skin Texture: Edema: Yes Edema: Yes No Abnormalities Noted Periwound Skin No Abnormalities Noted No Abnormalities Noted No Abnormalities Noted Moisture: Periwound Skin Color: Erythema: Yes Erythema: Yes No Abnormalities  Noted Erythema Location: Circumferential Circumferential N/A Temperature: No Abnormality No Abnormality N/A Tenderness on Yes Yes No Palpation: Wound Preparation: Ulcer Cleansing: Ulcer Cleansing: N/A Rinsed/Irrigated with Rinsed/Irrigated with Saline Saline Topical Anesthetic Topical Anesthetic Applied: Other: lidocaine Applied: Other: lidocaine 4% 4% Treatment Notes Electronic Signature(s) Signed: 04/13/2016 5:31:40 PM By: Alric Quan Entered By: Alric Quan on 04/13/2016 16:31:10 [name] ([id number]) -------------------------------------------------------------------------------- Multi-Disciplinary Care Plan Details Patient Name: [name] Date of Service: 04/13/2016 3:45 PM Medical Record Patient Account Number: [id number] Number: Treating RN: Ahmed Prima 05-27-1919 (82 y.o. Other Clinician: Date of Birth/Sex: Female) Treating ROBSON, [place] Primary Care Physician: Elizebeth Koller Physician/Extender: G Referring Physician: Laverle Hobby in Treatment: 1 Active Inactive Abuse / Safety / Falls / Self Care Management Nursing Diagnoses: Potential for falls Goals: Patient will remain injury free Date Initiated: 04/06/2016 Goal Status: Active Interventions: Assess fall risk on admission and as needed Notes: Nutrition Nursing Diagnoses: Imbalanced nutrition Goals: Patient/caregiver agrees to and verbalizes understanding of need to use nutritional supplements and/or vitamins as prescribed Date Initiated: 04/06/2016 Goal Status: Active Interventions: Assess patient nutrition upon admission and as needed per policy Notes: Orientation to the Wound Care Program Nursing Diagnoses: Knowledge deficit related to the wound healing center program [name], [name] ([id number]) Goals: Patient/caregiver will verbalize understanding of the [facility] Date Initiated: 04/06/2016 Goal Status:  Active Interventions: Provide education on orientation to the wound center Notes: Pain, Acute or Chronic Nursing Diagnoses: Pain, acute or chronic: actual or potential Potential alteration in comfort, pain Goals: Patient will verbalize adequate pain control and receive pain control interventions during procedures as needed Date Initiated: 04/06/2016 Goal Status: Active Interventions: Assess comfort goal upon admission Complete pain assessment as per visit requirements Notes: Wound/Skin Impairment Nursing Diagnoses: Impaired tissue integrity Goals: Ulcer/skin breakdown will have a volume reduction of 30% by week 4 Date Initiated: 04/06/2016 Goal Status: Active Ulcer/skin breakdown will have a volume reduction of 50% by week 8 Date Initiated: 04/06/2016 Goal Status: Active Ulcer/skin breakdown will have a volume reduction of 80% by week 12 Date Initiated: 04/06/2016 Goal Status: Active Interventions: Assess patient/caregiver ability to obtain necessary supplies [place], [place] ([id number]) Assess ulceration(s) every visit Notes: Electronic Signature(s) Signed: 04/13/2016 5:31:40 PM By: Alric Quan Entered By: Alric Quan on 04/13/2016 16:30:49 [name] ([id number]) -------------------------------------------------------------------------------- Pain Assessment Details Patient Name: [name] Date of Service: 04/13/2016 3:45 PM Medical Record Patient Account Number: [id number] Number: Treating RN: Ahmed Prima [birth year] (82 y.o. Other Clinician: Date of Birth/Sex: Female) Treating ROBSON, [place] Primary Care Physician: Elizebeth Koller Physician/Extender: G Referring Physician: Laverle Hobby in Treatment: 1 Active Problems Location of Pain Severity and Description of Pain Patient Has Paino Yes Site Locations Pain Location: Pain in Ulcers With Dressing Change: No Duration of the Pain. Constant / Intermittento  Intermittent How Long Does it Lasto Hours: 8 Minutes: Rate the pain. Current Pain  Level: 1 Worst Pain Level: 8 Least Pain Level: 1 Character of Pain Describe the Pain: Aching, Throbbing Pain Management and Medication Current Pain Management: Electronic Signature(s) Signed: 04/13/2016 5:31:40 PM By: Alric Quan Entered By: Alric Quan on 04/13/2016 16:03:23 [name] ([id number]) -------------------------------------------------------------------------------- Patient/Caregiver Education Details Patient Name: [name] Date of Service: 04/13/2016 3:45 PM Medical Record Patient Account Number: [id number] Number: Treating RN: Ahmed Prima 1919-02-07 (82 y.o. Other Clinician: Date of Birth/Gender: Female) Treating ROBSON, [place] Primary Care Physician: Elizebeth Koller Physician/Extender: G Referring Physician: Laverle Hobby in Treatment: 1 Education Assessment Education Provided To: Patient Education Topics Provided Wound/Skin Impairment: Handouts: Other: change dressing as ordered Methods: Demonstration, Explain/Verbal Responses: State content correctly Electronic Signature(s) Signed: 04/13/2016 5:31:40 PM By: Alric Quan Entered By: Alric Quan on 04/13/2016 16:41:00 [name] ([id number]) -------------------------------------------------------------------------------- Wound Assessment Details Patient Name: [name] Date of Service: 04/13/2016 3:45 PM Medical Record Patient Account Number: [id number] Number: Treating RN: Ahmed Prima 1919-01-04 (82 y.o. Other Clinician: Date of Birth/Sex: Female) Treating ROBSON, MICHAEL Primary Care Physician: Elizebeth Koller Physician/Extender: G Referring Physician: Laverle Hobby in Treatment: 1 Wound Status Wound Number: 1 Primary Venous Leg Ulcer Etiology: Wound Location: Left Malleolus Wound Status: Open Wounding Event: Gradually  Appeared Comorbid Cataracts, Hypertension, Date Acquired: 04/07/2015 History: Osteoarthritis Weeks Of Treatment: 1 Clustered Wound: No Photos Photo Uploaded By: Alric Quan on 04/13/2016 17:27:50 Wound Measurements Length: (cm) 0.8 % Reduction in Width: (cm) 0.6 % Reduction in Depth: (cm) 0.2 Epithelializati Area: (cm) 0.377 Tunneling: Volume: (cm) 0.075 Undermining: Area: 2.1% Volume: 2.6% on: None No No Wound Description Classification: Partial Thickness Foul Odor Afte Wound Margin: Thickened Exudate Amount: Large Exudate Type: Serous Exudate Color: amber r Cleansing: No Wound Bed Granulation Amount: None Present (0%) Exposed Structure Necrotic Amount: Large (67-100%) Fascia Exposed: No Necrotic Quality: Adherent Slough Fat Layer Exposed: No [name], [name] ([id number]) Tendon Exposed: No Muscle Exposed: No Joint Exposed: No Bone Exposed: No Limited to Skin Breakdown Periwound Skin Texture Texture Color No Abnormalities Noted: No No Abnormalities Noted: No Localized Edema: Yes Erythema: Yes Erythema Location: Circumferential Moisture No Abnormalities Noted: No Temperature / Pain Temperature: No Abnormality Tenderness on Palpation: Yes Wound Preparation Ulcer Cleansing: Rinsed/Irrigated with Saline Topical Anesthetic Applied: Other: lidocaine 4%, Treatment Notes Wound #1 (Left Malleolus) 1. Cleansed with: Clean wound with Normal Saline 2. Anesthetic Topical Lidocaine 4% cream to wound bed prior to debridement 4. Dressing Applied: Prisma Ag 5. Secondary Dressing Applied Dry Gauze Foam Kerlix/Conform 7. Secured with Tape Notes netting Electronic Signature(s) Signed: 04/13/2016 5:31:40 PM By: Alric Quan Entered By: Alric Quan on 04/13/2016 16:11:33 [name] ([id number]) -------------------------------------------------------------------------------- Wound Assessment Details Patient Name: [name] Date of  Service: 04/13/2016 3:45 PM Medical Record Patient Account Number: [id number] Number: Treating RN: Ahmed Prima October 31, 1918 (82 y.o. Other Clinician: Date of Birth/Sex: Female) Treating ROBSON, [place] Primary Care Physician: Elizebeth Koller Physician/Extender: G Referring Physician: Laverle Hobby in Treatment: 1 Wound Status Wound Number: 2 Primary To be determined Etiology: Wound Location: Left Toe Fifth - Lateral Wound Status: Open Wounding Event: Gradually Appeared Comorbid Cataracts, Hypertension, Date Acquired: 04/07/2015 History: Osteoarthritis Weeks Of Treatment: 1 Clustered Wound: No Wound Measurements Length: (cm) 0.3 Width: (cm) 0.3 Depth: (cm) 0.3 Area: (cm) 0.071 Volume: (cm) 0.021 % Reduction in Area: -51.1% % Reduction in Volume: -133.3% Epithelialization: None Tunneling: No Undermining: No Wound Description Classification: Partial Thickness Wound Margin: Thickened Exudate Amount: Large Exudate Type: Serous Exudate Color: amber Foul Odor After Cleansing: No  Wound Bed Granulation Amount: None Present (0%) Exposed Structure Necrotic Amount: Large (67-100%) Fascia Exposed: No Necrotic Quality: Adherent Slough Fat Layer Exposed: No Tendon Exposed: No Muscle Exposed: No Joint Exposed: No Bone Exposed: No Limited to Skin Breakdown Periwound Skin Texture Texture Color No Abnormalities Noted: No No Abnormalities Noted: No Localized Edema: Yes Erythema: Yes Erythema Location: Circumferential Moisture [name], [name] ([id number]) No Abnormalities Noted: No Temperature / Pain Temperature: No Abnormality Tenderness on Palpation: Yes Wound Preparation Ulcer Cleansing: Rinsed/Irrigated with Saline Topical Anesthetic Applied: Other: lidocaine 4%, Treatment Notes Wound #2 (Left, Lateral Toe Fifth) 1. Cleansed with: Clean wound with Normal Saline 2. Anesthetic Topical Lidocaine 4% cream to wound bed prior to debridement 4.  Dressing Applied: Prisma Ag 5. Secondary Dressing Applied Dry Gauze Foam Kerlix/Conform 7. Secured with Tape Notes netting Electronic Signature(s) Signed: 04/13/2016 5:31:40 PM By: Alric Quan Entered By: Alric Quan on 04/13/2016 16:11:56 [name] ([id number]) -------------------------------------------------------------------------------- Wound Assessment Details Patient Name: [name] Date of Service: 04/13/2016 3:45 PM Medical Record Patient Account Number: [id number] Number: Treating RN: Ahmed Prima 1919/09/16 (82 y.o. Other Clinician: Date of Birth/Sex: Female) Treating ROBSON, MICHAEL Primary Care Physician: Elizebeth Koller Physician/Extender: G Referring Physician: Laverle Hobby in Treatment: 1 Wound Status Wound Number: 3 Primary Etiology: Venous Leg Ulcer Wound Location: Right, Anterior Lower Leg Wound Status: Healed - Epithelialized Wounding Event: Gradually Appeared Date Acquired: 04/06/2016 Weeks Of Treatment: 1 Clustered Wound: No Wound Measurements Length: (cm) 0 % Reduction Width: (cm) 0 % Reduction Depth: (cm) 0 Area: (cm) 0 Volume: (cm) 0 in Area: 100% in Volume: 100% Wound Description Classification: Partial Thickness Periwound Skin Texture Texture Color No Abnormalities Noted: No No Abnormalities Noted: No Moisture No Abnormalities Noted: No Electronic Signature(s) Signed: 04/13/2016 5:31:40 PM By: Alric Quan Entered By: Alric Quan on 04/13/2016 16:08:50 [name] ([id number]) -------------------------------------------------------------------------------- Vitals Details Patient Name: [name] Date of Service: 04/13/2016 3:45 PM Medical Record Patient Account Number: [id number] Number: Treating RN: Ahmed Prima 03/03/1919 (82 y.o. Other Clinician: Date of Birth/Sex: Female) Treating ROBSON, [place] Primary Care Physician: Elizebeth Koller Physician/Extender: G Referring Physician: Laverle Hobby in Treatment: 1 Vital Signs Time Taken: 16:03 Temperature (F): 97.7 Height (in): 64 Pulse (bpm): 87 Weight (lbs): 158 Respiratory Rate (breaths/min): 16 Body Mass Index (BMI): 27.1 Blood Pressure (mmHg): 145/51 Reference Range: 80 - 120 mg / dl Electronic Signature(s) Signed: 04/13/2016 5:31:40 PM By: Alric Quan Entered By: Alric Quan on 04/13/2016 16:05:01

## 2016-04-14 NOTE — Progress Notes (Signed)
[name], [name] ([id number]) Visit Report for 04/13/2016 Chief Complaint Document Details Patient Name: [name], [name] Date of Service: 04/13/2016 3:45 PM Medical Record Patient Account Number: [id number] Number: Treating RN: Carolyne [name], [name] 07/24/1919 (82 y.o. Other Clinician: Date of Birth/Sex: Female) Treating [name], [name] Primary Care Physician/Extender: [name] Physician: Referring Physician: Laverle [name] in Treatment: 1 Information Obtained from: Patient Chief Complaint 04/06/16; the patient is here for review of wounds on the left lateral ankle and left great toe that been present for a year Electronic Signature(s) Signed: 04/14/2016 7:30:59 AM By: [name] Entered By: [name] on 04/13/2016 16:49:10 [name] ([id number]) -------------------------------------------------------------------------------- Debridement Details Patient Name: [name] Date of Service: 04/13/2016 3:45 PM Medical Record Patient Account Number: [id number] Number: Treating RN: Carolyne [name], [name] 1919/04/07 (82 y.o. Other Clinician: Date of Birth/Sex: Female) Treating [name], [name] Primary Care Physician/Extender: [name], [name] Physician: Referring Physician: Laverle [name] in Treatment: 1 Debridement Performed for Wound #1 Left Malleolus Assessment: Performed By: Physician [name] Dillon, [name] Debridement: Debridement Pre-procedure Yes Verification/Time Out Taken: Start Time: 16:36 Pain Control: Other : lidocaine 4% cream Level: Skin/Subcutaneous Tissue Total Area Debrided (L x 0.8 (cm) x 0.6 (cm) = 0.48 (cm) W): Tissue and other Viable, Non-Viable, Exudate, Fibrin/Slough, Subcutaneous material debrided: Instrument: Curette Bleeding: Minimum Hemostasis Achieved: Pressure End Time: 16:38 Procedural Pain: 0 Post Procedural Pain: 0 Response to Treatment: Procedure was tolerated well Post  Debridement Measurements of Total Wound Length: (cm) 0.8 Width: (cm) 0.6 Depth: (cm) 0.3 Volume: (cm) 0.113 Post Procedure Diagnosis Same as Pre-procedure Electronic Signature(s) Signed: 04/13/2016 5:31:40 PM By: [name] Signed: 04/14/2016 7:30:59 AM By: [name] Entered By: [name] on 04/13/2016 16:48:40 [name] ([id number]) [name] ([id number]) -------------------------------------------------------------------------------- Debridement Details Patient Name: [name] Date of Service: 04/13/2016 3:45 PM Medical Record Patient Account Number: [id number] Number: Treating RN: [name] [birth year] (82 y.o. Other Clinician: Date of Birth/Sex: Female) Treating [name], [name] Primary Care Physician/Extender: [name], [name] Physician: Referring Physician: Laverle [name] in Treatment: 1 Debridement Performed for Wound #2 Left,Lateral Toe Fifth Assessment: Performed By: Physician [name] Dillon, [name] Debridement: Debridement Pre-procedure Yes Verification/Time Out Taken: Start Time: 16:38 Pain Control: Other : lidocaine 4% cream Level: Skin/Subcutaneous Tissue Total Area Debrided (L x 0.3 (cm) x 0.3 (cm) = 0.09 (cm) W): Tissue and other Viable, Non-Viable, Exudate, Fibrin/Slough, Subcutaneous material debrided: Instrument: Curette Bleeding: Minimum Hemostasis Achieved: Pressure End Time: 16:39 Procedural Pain: 0 Post Procedural Pain: 0 Response to Treatment: Procedure was tolerated well Post Debridement Measurements of Total Wound Length: (cm) 0.3 Width: (cm) 0.3 Depth: (cm) 0.3 Volume: (cm) 0.021 Post Procedure Diagnosis Same as Pre-procedure Electronic Signature(s) Signed: 04/13/2016 5:31:40 PM By: [name] Signed: 04/14/2016 7:30:59 AM By: [name] Entered By: [name] on 04/13/2016 16:48:56 [name] ([id number]) [name]  ([id number]) -------------------------------------------------------------------------------- HPI Details Patient Name: [name] Date of Service: 04/13/2016 3:45 PM Medical Record Patient Account Number: [id number] Number: Treating RN: [name] 1919/05/05 (82 y.o. Other Clinician: Date of Birth/Sex: Female) Treating [name], [name] Primary Care Physician/Extender: [name] Physician: Referring Physician: Laverle [name] in Treatment: 1 History of Present Illness HPI Description: 04/06/16; this is a 82 year old woman who arrives accompanied by 2 daughters for a wound on her left ankle and her left fifth toe. These have apparently been present for a year. I'm not quite certain how she came to this clinic however she was being followed by [name] her  podiatrist for these wounds. She was also referred to [name] and they apparently did a test presumably arterial studies although we don't have any of these results and we couldn't get through to the office today. The family but has been applying a combination of Bactroban and a light bandage and perhaps more recently Silvadene cream. She did have an x-ray of the foot roughly 6 months ago at the podiatry office the family was unaware that if there were any abnormalities. Apparently they have not seen any healing here. Our intake nurse noted a slight skin tear on the right anterior lower leg. Nobody seemed aware of this. ABIs calculated in this clinic was 0.3 on the right and 0.4 on the left I have reviewed things in [facility]. There is very little information on this patient. She apparently follows in current total clinic which we don't have information from. She has mentioned already been to a [facility]. She has a history of hypothyroidism, nephrolithiasis arthritis and has had a previous mastectomy. 04/13/16; patient's x-ray was normal. She is already been to see [name]  vascular surgery. Her arterial exam was from November 2016 this showed a left ABI of 0.62 her right of 0.76. Her duplex ultrasound of the left leg showed biphasic waves in the common femoral and distal femoral artery however monophasic waves in the superficial femoral artery proximal and mid biphasic it distal. 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 The patient tells me that she has pain at night when she tries to lie down which is improved by getting up and sitting in the chair this sounds like claudication at rest. Her wounds are on the left medial malleolus and the dorsal fifth toe small punched out wounds that are right on bone. We use Santyl last week Electronic Signature(s) Signed: 04/14/2016 7:30:59 AM By: [name] Entered By: [name] on 04/13/2016 16:52:03 [name] ([id number]) -------------------------------------------------------------------------------- Physical Exam Details Patient Name: [name] Date of Service: 04/13/2016 3:45 PM Medical Record Patient Account Number: [id number] Number: Treating RN: [name] 1919-01-02 (82 y.o. Other Clinician: Date of Birth/Sex: Female) Treating [name], [name] Primary Care Physician/Extender: [name] Physician: Referring Physician: Laverle [name] in Treatment: 1 Constitutional Sitting or standing Blood Pressure is within target range for patient.. Pulse regular and within target range for patient.Marland Kitchen Respirations regular, non-labored and within target range.. Temperature is normal and within the target range for the patient.. Cardiovascular Femoral and popliteal pulses on the left are palpable. Pedal pulses absent bilaterally.. Notes Wound exam; I think both of these are on bone at present. I'm going to change from Santyl to silver collagen to see if we can stimulate some granulation. I don't believe there is any infection at either site. Electronic  Signature(s) Signed: 04/14/2016 7:30:59 AM By: [name] Entered By: [name] on 04/13/2016 16:53:32 [name] ([id number]) -------------------------------------------------------------------------------- Physician Orders Details Patient Name: [name] Date of Service: 04/13/2016 3:45 PM Medical Record Patient Account Number: [id number] Number: Treating RN: [name] 09-Jan-1919 (82 y.o. Other Clinician: Date of Birth/Sex: Female) Treating [name], [name] Primary Care Physician/Extender: [name] Physician: Referring Physician: Laverle [name] in Treatment: 1 Verbal / Phone Orders: Yes Clinician: Pinkerton, [name] Read Back and Verified: Yes Diagnosis Coding Wound Cleansing Wound #1 Left Malleolus o Clean wound with Normal Saline. - for clinic use o Cleanse wound with mild soap and water o May Shower, gently pat wound dry prior to applying new dressing. Wound #  2 Left,Lateral Toe Fifth o Clean wound with Normal Saline. - for clinic use o Cleanse wound with mild soap and water o May Shower, gently pat wound dry prior to applying new dressing. Anesthetic Wound #1 Left Malleolus o Topical Lidocaine 4% cream applied to wound bed prior to debridement - for clinic use Wound #2 Left,Lateral Toe Fifth o Topical Lidocaine 4% cream applied to wound bed prior to debridement - for clinic use Skin Barriers/Peri-Wound Care Wound #1 Left Malleolus o Barrier cream Wound #2 Left,Lateral Toe Fifth o Barrier cream Primary Wound Dressing Wound #1 Left Malleolus o Prisma Ag - moisten with saline Wound #2 Left,Lateral Toe Fifth o Prisma Ag - moisten with saline Secondary Dressing Merica, Yeily ([id number]) Wound #1 Left Malleolus o Gauze and Kerlix/Conform - netting, tape o Foam Wound #2 Left,Lateral Toe Fifth o Gauze and Kerlix/Conform o Foam Dressing Change Frequency Wound #1 Left  Malleolus o Change dressing every other day. Wound #2 Left,Lateral Toe Fifth o Change dressing every other day. Follow-up Appointments Wound #1 Left Malleolus o Return Appointment in 1 week. Wound #2 Left,Lateral Toe Fifth o Return Appointment in 1 week. Edema Control Wound #1 Left Malleolus o Elevate legs to the level of the heart and pump ankles as often as possible o Other: - compression stockings Wound #2 Left,Lateral Toe Fifth o Elevate legs to the level of the heart and pump ankles as often as possible o Other: - compression stockings Additional Orders / Instructions Wound #1 Left Malleolus o Increase protein intake. Wound #2 Left,Lateral Toe Fifth o Increase protein intake. Medications-please add to medication list. Wound #1 Left Malleolus o Other: - Vitamin C, Zinc, Multivitamins Wound #2 Left,Lateral Toe Fifth o Other: - Vitamin C, Zinc, Multivitamins Electronic Signature(s) KIARA, WELCOME ([id number]) Signed: 04/13/2016 5:31:40 PM By: [name] Signed: 04/14/2016 7:30:59 AM By: [name] Entered By: [name] on 04/13/2016 16:42:13 [name] ([id number]) -------------------------------------------------------------------------------- Problem List Details Patient Name: [name] Date of Service: 04/13/2016 3:45 PM Medical Record Patient Account Number: [id number] Number: Treating RN: [name] July 09, 1919 (82 y.o. 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 Other Clinician: Date of Birth/Sex: Female) Treating [name], [name] Primary Care Physician/Extender: [name] Physician: Referring Physician: Laverle [name] in Treatment: 1 Subjective Chief Complaint Information obtained from Patient 04/06/16; the patient is here for review of wounds on the left lateral ankle and left great toe that been present for a year History of Present Illness (HPI) 04/06/16; this is a 82 year old woman who arrives accompanied by 2 daughters for a wound on her left ankle and her left fifth toe. These have apparently been present for a year. I'm not  quite certain how she came to this clinic however she was being followed by [name] her podiatrist for these wounds. She was also referred to [name] and they apparently did a test presumably arterial studies although we don't have any of these results and we couldn't get through to the office today. The family but has been applying a combination of Bactroban and a light bandage and perhaps more recently Silvadene cream. She did have an x-ray of the foot roughly 6 months ago at the podiatry office the family was unaware that if there were any abnormalities. Apparently they have not seen any healing here. Our intake nurse  noted a slight skin tear on the right anterior lower leg. Nobody seemed aware of this. ABIs calculated in this clinic was 0.3 on the right and 0.4 on the left I have reviewed things [facility]. There is very little information on this patient. She apparently follows in current total clinic which we don't have information from. She has mentioned already been to a [facility]. She has a history of hypothyroidism, nephrolithiasis arthritis and has had a previous mastectomy. 04/13/16; patient's x-ray was normal. She is already been to see [name] vascular surgery. Her arterial exam was from November 2016 this showed a left ABI of 0.62 her right of 0.76. Her duplex ultrasound of the left leg showed biphasic waves in the common femoral and distal femoral artery however monophasic waves in the superficial femoral artery proximal and mid biphasic it distal. 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 The patient tells me that she has pain at night when she tries to lie down which is improved by getting up and sitting in the chair this sounds like claudication at rest. Her wounds are on the left medial malleolus and the dorsal fifth toe small punched out wounds that are right on bone. We use Santyl last week [name], [name] ([id number]) Objective Constitutional Sitting or standing Blood Pressure is within target range for patient.. Pulse regular and within target range for patient.Marland Kitchen Respirations regular, non-labored and within target range.. Temperature is normal and within the target range for the patient.. Vitals Time Taken: 4:03 PM, Height: 64 in, Weight: 158 lbs, BMI: 27.1, Temperature: 97.7 F, Pulse: 87 bpm, Respiratory Rate: 16 breaths/min, Blood Pressure: 145/51 mmHg. Cardiovascular Femoral and popliteal pulses on the left are palpable. Pedal pulses absent bilaterally.. General Notes: Wound exam; I think both of these are on bone at present. I'm going to change from Santyl to silver  collagen to see if we can stimulate some granulation. I don't believe there is any infection at either site. Integumentary (Hair, Skin) Wound #1 status is Open. Original cause of wound was Gradually Appeared. The wound is located on the Left Malleolus. The wound measures 0.8cm length x 0.6cm width x 0.2cm depth; 0.377cm^2 area and 0.075cm^3 volume. The wound is limited to skin breakdown. There is no tunneling or undermining noted. There is a large amount of serous drainage noted. The wound margin is thickened. There is no granulation within the wound bed. There is a large (67-100%) amount of necrotic tissue within the wound bed including Adherent Slough. The periwound skin appearance exhibited: Localized Edema, Erythema. The surrounding wound skin color is noted with erythema which is circumferential. Periwound temperature was noted as No Abnormality. The periwound has tenderness on palpation. Wound #2 status is Open. Original cause of wound was Gradually Appeared. The wound is located on the Left,Lateral Toe Fifth. The wound measures 0.3cm  length x 0.3cm width x 0.3cm depth; 0.071cm^2 area and 0.021cm^3 volume. The wound is limited to skin breakdown. There is no tunneling or undermining noted. There is a large amount of serous drainage noted. The wound margin is thickened. There is no granulation within the wound bed. There is a large (67-100%) amount of necrotic tissue within the wound bed including Adherent Slough. The periwound skin appearance exhibited: Localized Edema, Erythema. The surrounding wound skin color is noted with erythema which is circumferential. Periwound temperature was noted as No Abnormality. The periwound has tenderness on palpation. Wound #3 status is Healed - Epithelialized. Original cause of wound was Gradually Appeared. The wound is located on the Right,Anterior Lower Leg. The wound measures 0cm length x 0cm width x 0cm depth; 0cm^2 area and 0cm^3  volume. 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 There was a Skin/Subcutaneous Tissue Debridement [id number]) debridement with total area of 0.48 sq cm performed by [name] Dillon, [name]. with the following instrument(s): Curette to remove Viable and Non-Viable tissue/material including Exudate, Fibrin/Slough, and Subcutaneous after achieving pain control using Other (lidocaine 4% cream). A time out was conducted prior to the start of the procedure. A Minimum amount of bleeding was controlled with Pressure. The procedure was tolerated well with a pain level of 0 throughout and a pain level of 0 following the procedure. Post Debridement Measurements: 0.8cm length x 0.6cm width x 0.3cm depth; 0.113cm^3 volume. Post procedure Diagnosis Wound #1: Same as Pre-Procedure Wound #2 Wound #2 is a To be determined located on the Left,Lateral Toe Fifth . There was a Skin/Subcutaneous Tissue Debridement [id number]) debridement with total area of 0.09 sq cm performed by [name] Dillon, [name]. with the following instrument(s): Curette to remove Viable and Non-Viable tissue/material including Exudate, Fibrin/Slough, and Subcutaneous after achieving pain control using Other (lidocaine 4% cream). A time out was conducted prior to the start of the procedure. A Minimum amount of bleeding was controlled with Pressure. The procedure was tolerated well with a pain level of 0 throughout and a pain level of 0 following the procedure. Post Debridement Measurements:  0.3cm length x 0.3cm width x 0.3cm depth; 0.021cm^3 volume. Post procedure Diagnosis Wound #2: Same as Pre-Procedure Plan Wound Cleansing: Wound #1 Left Malleolus: Clean wound with Normal Saline. - for clinic use Cleanse wound with mild soap and water May Shower, gently pat wound dry prior to applying new dressing. Wound #2 Left,Lateral Toe Fifth: Clean wound with Normal Saline. - for clinic use Geddes, Simrah ([id number]) Cleanse wound with mild soap and water May Shower, gently pat wound dry prior to applying new dressing. Anesthetic: Wound #1 Left Malleolus: Topical Lidocaine 4% cream applied to wound bed prior to debridement - for clinic use Wound #2 Left,Lateral Toe Fifth: Topical Lidocaine 4% cream applied to wound bed prior to debridement - for clinic use Skin Barriers/Peri-Wound Care: Wound #1 Left Malleolus: Barrier cream Wound #2 Left,Lateral Toe Fifth: Barrier cream Primary Wound Dressing: Wound #1 Left Malleolus: Prisma Ag - moisten with saline Wound #2 Left,Lateral Toe Fifth: Prisma Ag - moisten with  saline Secondary Dressing: Wound #1 Left Malleolus: Gauze and Kerlix/Conform - netting, tape Foam Wound #2 Left,Lateral Toe Fifth: Gauze and Kerlix/Conform Foam Dressing Change Frequency: Wound #1 Left Malleolus: Change dressing every other day. Wound #2 Left,Lateral Toe Fifth: Change dressing every other day. Follow-up Appointments: Wound #1 Left Malleolus: Return Appointment in 1 week. Wound #2 Left,Lateral Toe Fifth: Return Appointment in 1 week. Edema Control: Wound #1 Left Malleolus: Elevate legs to the level of the heart and pump ankles as often as possible Other: - compression stockings Wound #2 Left,Lateral Toe Fifth: Elevate legs to the level of the heart and pump ankles as often as possible Other: - compression stockings Additional Orders / Instructions: Wound #1 Left Malleolus: Increase protein intake. Wound #2 Left,Lateral Toe  Fifth: Increase protein intake. Medications-please add to medication list.: Wound #1 Left Malleolus: Other: - Vitamin C, Zinc, Multivitamins Wound #2 Left,Lateral Toe Fifth: Other: - Vitamin C, Zinc, Multivitamins Teems, Elodie ([id number]) #1 I have changed to Prisma, foam to the lateral malleolus and Kerlix to the fifth toe. #2 the patient is probably having claudication at rest. This gets into the ethical dilemma of how to manage something like this in somebody who is 82 years old however in my opinion if we cannot get these to heal she appears healthy enough at least superficially to look again an arteriogram. Perhaps this would be better done by an interventional cardiologist. I am doubtful we will be able to get these wounds to heal without more blood flow Electronic Signature(s) Signed: 04/14/2016 7:30:59 AM By: [name] Entered By: [name] on 04/13/2016 16:55:29 [name] ([id number]) -------------------------------------------------------------------------------- SuperBill Details Patient Name: [name] Date of Service: 04/13/2016 Medical Record Patient Account Number: [id number] Number: Treating RN: Carolyne [name], [name] 20-Jun-1919 (82 y.o. 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 of right calf limited to breakdown of skin Facility Procedures CPT4 Code Description: [id number] - DEB SUBQ TISSUE 20 SQ CM/< ICD-10 Description Diagnosis I70.243  Atherosclerosis of native arteries of left leg with ul Modifier: ceration of ankl Quantity: 1 e Physician Procedures CPT4 Code Description: [id number] - WC PHYS SUBQ TISS 20 SQ CM ICD-10 Description Diagnosis I70.243 Atherosclerosis of native arteries of left leg with ul Modifier: ceration of ankl Quantity: 1 e Electronic Signature(s) Signed: 04/14/2016 7:30:59 AM By: [name] Entered By: [name] on 04/13/2016 16:55:52

## 2016-04-20 ENCOUNTER — Encounter: Payer: Medicare PPO | Admitting: Nurse Practitioner

## 2016-04-20 DIAGNOSIS — I70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: Secondary | ICD-10-CM | POA: Diagnosis not present

## 2016-04-22 NOTE — Progress Notes (Signed)
[name], [name] ([id number]) Visit Report for 04/20/2016 Chief Complaint Document Details Patient Name: [name], [name] Date of Service: 04/20/2016 1:30 PM Medical Record Number: [id number] Patient Account Number: [id number] Date of Birth/Sex: Mar 03, 1919 (82 y.o. Female) Treating RN: Ahmed Prima Primary Care Physician: Elizebeth Koller Other Clinician: Referring Physician: Elizebeth Koller Treating Physician/Extender: Loistine Chance in Treatment: 2 Information Obtained from: Patient Chief Complaint 04/06/16; the patient is here for review of wounds on the left lateral ankle and left great toe that been present for a year Electronic Signature(s) Signed: 04/21/2016 5:22:48 PM By: Londell Moh FNP Entered By: Londell Moh on 04/20/2016 15:56:14 [name] ([id number]) -------------------------------------------------------------------------------- HPI Details Patient Name: [name] Date of Service: 04/20/2016 1:30 PM Medical Record Number: [id number] Patient Account Number: [id number] Date of Birth/Sex: 1919/06/17 (82 y.o. Female) Treating RN: Carolyne Fiscal, Debi Primary Care Physician: Elizebeth Koller Other Clinician: Referring Physician: Elizebeth Koller Treating Physician/Extender: Loistine Chance in Treatment: 2 History of Present Illness HPI Description: 04/06/16; this is a 82 year old woman who arrives accompanied by 2 daughters for a wound on her left ankle and her left fifth toe. These have apparently been present for a year. I'm not quite certain how she came to this clinic however she was being followed by Sharlotte Alamo her podiatrist for these wounds. She was also referred to [facility] and they apparently did a test presumably arterial studies although we don't have any of these results and we couldn't get through to the office today. The family but has been applying a combination of Bactroban and a light bandage and  perhaps more recently Silvadene cream. She did have an x-ray of the foot roughly 6 months ago at the podiatry office the family was unaware that if there were any abnormalities. Apparently they have not seen any healing here. Our intake nurse noted a slight skin tear on the right anterior lower leg. Nobody seemed aware of this. ABIs calculated in this clinic was 0.3 on the right and 0.4 on the left I have reviewed things [facility]. There is very little information on this patient. She apparently follows in current total clinic which we don't have information from. She has mentioned already been to a [facility]. She has a history of hypothyroidism, nephrolithiasis arthritis and has had a previous mastectomy. 04/13/16; patient's x-ray was normal. She is already been to see Dr. Delana Meyer vascular surgery. Her arterial exam was from November 2016 this showed a left ABI of 0.62 her right of 0.76. Her duplex ultrasound of the left leg showed biphasic waves in the common femoral and distal femoral artery however monophasic waves in the superficial femoral artery proximal and mid biphasic it distal. 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 The patient tells me that she has pain at night when she tries to lie down which is improved by getting up and sitting in the chair this sounds like claudication at rest. Her wounds are on the left medial malleolus and the dorsal fifth toe small punched out wounds that are right on bone. We use Santyl last week 04/20/16: nurse informed me pt has declined evaluation for significant PAD. she denies systemic s/s of infections. Electronic Signature(s) Signed: 04/21/2016 5:22:48 PM By: Londell Moh FNP Entered By: Londell Moh on 04/20/2016 15:57:31 [name] ([id number]) -------------------------------------------------------------------------------- Physical Exam Details Patient Name: [name] Date of Service: 04/20/2016 1:30 PM Medical Record  Number: [id number] Patient Account Number: [id number] Date of Birth/Sex: [birth year] (82 y.o. Female) Treating RN: Ahmed Prima Primary Care Physician:  Elizebeth Koller Other Clinician: Referring Physician: Elizebeth Koller Treating Physician/Extender: Loistine Chance in Treatment: 2 Constitutional Patient's appearance is neat and clean. Appears in no acute distress. Well nourished and well developed.. Ears, Nose, Mouth, and Throat Patient can hear normal speaking tones without difficulty.Marland Kitchen Respiratory Respiratory effort is easy and symmetric bilaterally. Rate is normal at rest and on room air.. Cardiovascular Pedal pulses absent bilaterally.Marland Kitchen Psychiatric Judgement and insight intact.. Alert and oriented times 3.. Short and long term memory intact.. No evidence of depression, anxiety, or agitation. Calm, cooperative, and communicative. Appropriate interactions and affect.. Electronic Signature(s) Signed: 04/21/2016 5:22:48 PM By: Londell Moh FNP Entered By: Londell Moh on 04/20/2016 15:58:17 [name] ([id number]) -------------------------------------------------------------------------------- Physician Orders Details Patient Name: [name] Date of Service: 04/20/2016 1:30 PM Medical Record Number: [id number] Patient Account Number: [id number] Date of Birth/Sex: Feb 12, 1919 (82 y.o. Female) Treating RN: Ahmed Prima Primary Care Physician: Elizebeth Koller Other Clinician: Referring Physician: Elizebeth Koller Treating Physician/Extender: Loistine Chance in Treatment: 2 Verbal / Phone Orders: Yes Clinician: Carolyne Fiscal, Debi Read Back and Verified: Yes Diagnosis Coding Wound Cleansing Wound #1 Left Malleolus o Clean wound with Normal Saline. - for clinic use o Cleanse wound with mild soap and water o May Shower, gently pat wound dry prior to applying new dressing. Wound #2 Left,Lateral Toe Fifth o Clean wound with Normal  Saline. - for clinic use o Cleanse wound with mild soap and water o May Shower, gently pat wound dry prior to applying new dressing. Anesthetic Wound #1 Left Malleolus o Topical Lidocaine 4% cream applied to wound bed prior to debridement - for clinic use Wound #2 Left,Lateral Toe Fifth o Topical Lidocaine 4% cream applied to wound bed prior to debridement - for clinic use Skin Barriers/Peri-Wound Care Wound #1 Left Malleolus o Barrier cream Wound #2 Left,Lateral Toe Fifth o Barrier cream Primary Wound Dressing Wound #1 Left Malleolus o Prisma Ag - moisten with saline Wound #2 Left,Lateral Toe Fifth o Prisma Ag - moisten with saline Secondary Dressing Wound #1 Left Malleolus o Gauze and Kerlix/Conform - netting, tape o Foam Vanvranken, Fraidy ([id number]) Wound #2 Left,Lateral Toe Fifth o Gauze and Kerlix/Conform o Foam Dressing Change Frequency Wound #1 Left Malleolus o Change dressing every other day. Wound #2 Left,Lateral Toe Fifth o Change dressing every other day. Follow-up Appointments Wound #1 Left Malleolus o Return Appointment in 1 week. Wound #2 Left,Lateral Toe Fifth o Return Appointment in 1 week. Edema Control Wound #1 Left Malleolus o Elevate legs to the level of the heart and pump ankles as often as possible o Other: - compression stockings Wound #2 Left,Lateral Toe Fifth o Elevate legs to the level of the heart and pump ankles as often as possible o Other: - compression stockings Additional Orders / Instructions Wound #1 Left Malleolus o Increase protein intake. Wound #2 Left,Lateral Toe Fifth o Increase protein intake. Medications-please add to medication list. Wound #1 Left Malleolus o Other: - Vitamin C, Zinc, Multivitamins Wound #2 Left,Lateral Toe Fifth o Other: - Vitamin C, Zinc, Multivitamins Electronic Signature(s) Signed: 04/20/2016 5:18:57 PM By: Alric Quan Signed: 04/21/2016 5:22:48 PM  By: Londell Moh FNP Burley, Kaed[place] ([id number]) Entered By: Alric Quan on 04/20/2016 14:36:00 [name] ([id number]) -------------------------------------------------------------------------------- Problem List Details Patient Name: [name] Date of Service: 04/20/2016 1:30 PM Medical Record Number: [id number] Patient Account Number: [id number] Date of Birth/Sex: 1919/05/13 (82 y.o. 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 Female) Treating RN: Ahmed Prima Primary Care Physician: Elizebeth Koller Other Clinician: Referring Physician: Elizebeth Koller Treating Physician/Extender: Loistine Chance in Treatment: 2 Subjective Chief Complaint Information obtained from Patient 04/06/16; the patient is here for review of wounds on the left lateral ankle and left  great toe that been present for a year History of Present Illness (HPI) 04/06/16; this is a 82 year old woman who arrives accompanied by 2 daughters for a wound on her left ankle and her left fifth toe. These have apparently been present for a year. I'm not quite certain how she came to this clinic however she was being followed by Sharlotte Alamo her podiatrist for these wounds. She was also referred to [facility] and they apparently did a test presumably arterial studies although we don't have any of these results and we couldn't get through to the office today. The family but has been applying a combination of Bactroban and a light bandage and perhaps more recently Silvadene cream. She did have an x-ray of the foot roughly 6 months ago at the podiatry office the family was unaware that if there were any abnormalities. Apparently they have not seen any healing here. Our intake nurse noted a slight skin tear on the right anterior lower leg. Nobody seemed aware of this. ABIs calculated in this clinic was 0.3 on the right and 0.4 on the left I have reviewed things [facility]. There is very little information on this patient. She apparently follows in current total clinic which we don't have information from. She has mentioned already been to a [facility]. She has a history of hypothyroidism, nephrolithiasis arthritis and has had a previous mastectomy. 04/13/16; patient's x-ray was normal. She is already been to see Dr. Delana Meyer vascular surgery. Her arterial exam was from November 2016 this showed a left ABI of 0.62 her right of 0.76. Her duplex ultrasound of the left leg showed biphasic waves in the common femoral and distal femoral artery however monophasic waves in the superficial femoral artery proximal and mid biphasic it distal. 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 The patient tells me that she has pain at night when she tries to lie down which is improved by getting up and  sitting in the chair this sounds like claudication at rest. Her wounds are on the left medial malleolus and the dorsal fifth toe small punched out wounds that are right on bone. We use Santyl last week 04/20/16: nurse informed me pt has declined evaluation for significant PAD. she denies systemic s/s of infections. denies fever, chills, body aches or malaise. [name], [name] ([id number]) Objective Constitutional Patient's appearance is neat and clean. Appears in no acute distress. Well nourished and well developed.. Vitals Time Taken: 2:18 PM, Height: 64  in, Weight: 158 lbs, BMI: 27.1, Temperature: 97.7 F, Pulse: 64 bpm, Respiratory Rate: 16 breaths/min, Blood Pressure: 171/54 mmHg. Ears, Nose, Mouth, and Throat Patient can hear normal speaking tones without difficulty.Marland Kitchen Respiratory Respiratory effort is easy and symmetric bilaterally. Rate is normal at rest and on room air.. Cardiovascular Pedal pulses absent bilaterally.Marland Kitchen Psychiatric Judgement and insight intact.. Alert and oriented times 3.. Short and long term memory intact.. No evidence of depression, anxiety, or agitation. Calm, cooperative, and communicative. Appropriate interactions and affect.. Integumentary (Hair, Skin) Wound #1 status is Open. Original cause of wound was Gradually Appeared. The wound is located on the Left Malleolus. The wound measures 0.5cm length x 0.5cm width x 0.2cm depth; 0.196cm^2 area and 0.039cm^3 volume. The wound is limited to skin breakdown. There is no tunneling or undermining noted. There is a large amount of serous drainage noted. The wound margin is thickened. There is no granulation within the wound bed. There is a large (67-100%) amount of necrotic tissue within the wound bed including Adherent Slough. The periwound skin appearance exhibited: Localized Edema, Erythema. The surrounding wound skin color is noted with erythema which is circumferential. Periwound temperature was noted as  No Abnormality. The periwound has tenderness on palpation. Wound #2 status is Open. Original cause of wound was Gradually Appeared. The wound is located on the Left,Lateral Toe Fifth. The wound measures 0.4cm length x 0.3cm width x 0.3cm depth; 0.094cm^2 area and 0.028cm^3 volume. The wound is limited to skin breakdown. There is no tunneling or undermining noted. There is a large amount of serous drainage noted. The wound margin is thickened. There is no granulation within the wound bed. There is a large (67-100%) amount of necrotic tissue within the wound bed including Adherent Slough. The periwound skin appearance exhibited: Localized Edema, Erythema. The surrounding wound skin color is noted with erythema which is circumferential. Periwound temperature was noted as No Abnormality. The periwound has tenderness on palpation. 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 - for clinic use Cleanse wound with mild soap and water May Shower, gently pat wound dry prior to applying new dressing. Wound #2 Left,Lateral Toe Fifth: Clean wound with Normal Saline. - for clinic use Cleanse wound with mild soap and water May Shower, gently pat wound dry prior to applying new dressing. Anesthetic: Wound #1 Left Malleolus: Topical Lidocaine 4% cream applied to wound bed prior to debridement - for clinic use Wound #2 Left,Lateral Toe Fifth: Topical Lidocaine 4% cream applied to wound bed prior to debridement - for clinic use Skin Barriers/Peri-Wound Care: Wound #1  Left Malleolus: Barrier cream Wound #2 Left,Lateral Toe Fifth: Barrier cream Primary Wound Dressing: Wound #1 Left Malleolus: Prisma Ag - moisten with saline Wound #2 Left,Lateral Toe Fifth: Prisma Ag - moisten with saline Secondary Dressing: Wound #1 Left Malleolus: Gauze and Kerlix/Conform - netting, tape Foam Wound #2 Left,Lateral Toe Fifth: Gauze and Kerlix/Conform Foam Dressing Change Frequency: Wound #1 Left Malleolus: Change dressing every other day. Wound #2 Left,Lateral Toe FifthMurel [name], [name] ([id number]) Change dressing every other day. Follow-up Appointments: Wound #1 Left Malleolus: Return Appointment in  1 week. Wound #2 Left,Lateral Toe Fifth: Return Appointment in 1 week. Edema Control: Wound #1 Left Malleolus: Elevate legs to the level of the heart and pump ankles as often as possible Other: - compression stockings Wound #2 Left,Lateral Toe Fifth: Elevate legs to the level of the heart and pump ankles as often as possible Other: - compression stockings Additional Orders / Instructions: Wound #1 Left Malleolus: Increase protein intake. Wound #2 Left,Lateral Toe Fifth: Increase protein intake. Medications-please add to medication list.: Wound #1 Left Malleolus: Other: - Vitamin C, Zinc, Multivitamins Wound #2 Left,Lateral Toe Fifth: Other: - Vitamin C, Zinc, Multivitamins Follow-Up Appointments: A follow-up appointment should be scheduled. Medication Reconciliation completed and provided to Patient/Care Provider. A Patient Clinical Summary of Care was provided to [place] Signature(s) Signed: 04/21/2016 5:22:48 PM By: Londell Moh FNP Entered By: Londell Moh on 04/20/2016 15:58:49 [name] ([id number]) -------------------------------------------------------------------------------- [place] Details Patient Name: [name] Date of Service: 04/20/2016 Medical Record Number: [id number] Patient Account Number:  [id number] Date of Birth/Sex: 1919/01/12 (82 y.o. 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

## 2016-04-22 NOTE — Progress Notes (Signed)
NAUDIA, GALE ([id number]) Visit Report for 04/20/2016 Arrival Information Details Patient Name: [name], [name] Date of Service: 04/20/2016 1:30 PM Medical Record Number: [id number] Patient Account Number: [id number] Date of Birth/Sex: 10-16-1918 (82 y.o. Female) Treating RN: Ahmed Prima Primary Care Physician: Elizebeth Koller Other Clinician: Referring Physician: Elizebeth Koller Treating Physician/Extender: Loistine Chance in Treatment: 2 Visit Information History Since Last Visit All ordered tests and consults were completed: No Patient Arrived: Ambulatory Added or deleted any medications: No Arrival Time: 14:17 Any new allergies or adverse reactions: No Accompanied By: daughter Had a fall or experienced change in No Transfer Assistance: None activities of daily living that may affect Patient Identification Verified: Yes risk of falls: Secondary Verification Process Yes Signs or symptoms of abuse/neglect since last No Completed: visito Patient Requires Transmission-Based No Pain Present Now: No Precautions: Patient Has Alerts: No Electronic Signature(s) Signed: 04/20/2016 5:18:57 PM By: Alric Quan Entered By: Alric Quan on 04/20/2016 14:17:52 [name] ([id number]) -------------------------------------------------------------------------------- Clinic Level of Care Assessment Details Patient Name: [name] Date of Service: 04/20/2016 1:30 PM Medical Record Number: [id number] Patient Account Number: [id number] Date of Birth/Sex: May 07, 1919 (82 y.o. Female) Treating RN: Carolyne Fiscal, Debi Primary Care Physician: Elizebeth Koller Other Clinician: Referring Physician: Elizebeth Koller Treating Physician/Extender: Loistine Chance in Treatment: 2 Clinic Level of Care Assessment Items TOOL 4 Quantity Score X - Use when only an EandM is performed on FOLLOW-UP visit 1 0 ASSESSMENTS - Nursing Assessment / Reassessment X -  Reassessment of Co-morbidities (includes updates in patient status) 1 10 X - Reassessment of Adherence to Treatment Plan 1 5 ASSESSMENTS - Wound and Skin Assessment / Reassessment []  - Simple Wound Assessment / Reassessment - one wound 0 X - Complex Wound Assessment / Reassessment - multiple wounds 2 5 []  - Dermatologic / Skin Assessment (not related to wound area) 0 ASSESSMENTS - Focused Assessment []  - Circumferential Edema Measurements - multi extremities 0 []  - Nutritional Assessment / Counseling / Intervention 0 []  - Lower Extremity Assessment (monofilament, tuning fork, pulses) 0 []  - Peripheral Arterial Disease Assessment (using hand held doppler) 0 ASSESSMENTS - Ostomy and/or Continence Assessment and Care []  - Incontinence Assessment and Management 0 []  - Ostomy Care Assessment and Management (repouching, etc.) 0 PROCESS - Coordination of Care []  - Simple Patient / Family Education for ongoing care 0 X - Complex (extensive) Patient / Family Education for ongoing care 1 20 []  - Staff obtains Programmer, systems, Records, Test Results / Process Orders 0 []  - Staff telephones HHA, Nursing Homes / Clarify orders / etc 0 []  - Routine Transfer to another Facility (non-emergent condition) 0 [name], [name] ([id number]) []  - Routine Hospital Admission (non-emergent condition) 0 []  - New Admissions / Biomedical engineer / Ordering NPWT, Apligraf, etc. 0 []  - Emergency Hospital Admission (emergent condition) 0 X - Simple Discharge Coordination 1 10 []  - Complex (extensive) Discharge Coordination 0 PROCESS - Special Needs []  - Pediatric / Minor Patient Management 0 []  - Isolation Patient Management 0 []  - Hearing / Language / Visual special needs 0 []  - Assessment of Community assistance (transportation, D/C planning, etc.) 0 []  - Additional assistance / Altered mentation 0 []  - Support Surface(s) Assessment (bed, cushion, seat, etc.) 0 INTERVENTIONS - Wound Cleansing / Measurement []  -  Simple Wound Cleansing - one wound 0 X - Complex Wound Cleansing - multiple wounds 2 5 X - Wound Imaging (photographs - any number of wounds) 1 5 []  - Wound Tracing (instead of photographs) 0 []  - Simple  Wound Measurement - one wound 0 X - Complex Wound Measurement - multiple wounds 2 5 INTERVENTIONS - Wound Dressings []  - Small Wound Dressing one or multiple wounds 0 X - Medium Wound Dressing one or multiple wounds 1 15 []  - Large Wound Dressing one or multiple wounds 0 X - Application of Medications - topical 1 5 []  - Application of Medications - injection 0 INTERVENTIONS - Miscellaneous []  - External ear exam 0 [name], [name] ([id number]) []  - Specimen Collection (cultures, biopsies, blood, body fluids, etc.) 0 []  - Specimen(s) / Culture(s) sent or taken to Lab for analysis 0 []  - Patient Transfer (multiple staff / Harrel Lemon Lift / Similar devices) 0 []  - Simple Staple / Suture removal (25 or less) 0 []  - Complex Staple / Suture removal (26 or more) 0 []  - Hypo / Hyperglycemic Management (close monitor of Blood Glucose) 0 []  - Ankle / Brachial Index (ABI) - do not check if billed separately 0 X - Vital Signs 1 5 Has the patient been seen at the hospital within the last three years: Yes Total Score: 105 Level Of Care: New/Established - Level 3 Electronic Signature(s) Signed: 04/20/2016 5:18:57 PM By: Alric Quan Entered By: Alric Quan on 04/20/2016 16:44:40 [name] ([id number]) -------------------------------------------------------------------------------- Encounter Discharge Information Details Patient Name: [name] Date of Service: 04/20/2016 1:30 PM Medical Record Number: [id number] Patient Account Number: [id number] Date of Birth/Sex: 06-20-1919 (82 y.o. Female) Treating RN: Ahmed Prima Primary Care Physician: Elizebeth Koller Other Clinician: Referring Physician: Elizebeth Koller Treating Physician/Extender: Loistine Chance in  Treatment: 2 Encounter Discharge Information Items Discharge Pain Level: 0 Discharge Condition: Stable Ambulatory Status: Ambulatory Discharge Destination: Home Transportation: Private Auto Accompanied By: daughter Schedule Follow-up Appointment: Yes Medication Reconciliation completed and provided to Patient/Care Yes [name]: Provided on Clinical Summary of Care: 04/20/2016 Form Type Recipient Paper Patient [facility] Electronic Signature(s) Signed: 04/20/2016 2:46:38 PM By: Ruthine Dose Entered By: Ruthine Dose on 04/20/2016 14:46:38 [name] ([id number]) -------------------------------------------------------------------------------- Lower Extremity Assessment Details Patient Name: [name] Date of Service: 04/20/2016 1:30 PM Medical Record Number: [id number] Patient Account Number: [id number] Date of Birth/Sex: [birth year] (82 y.o. Female) Treating RN: Ahmed Prima Primary Care Physician: Elizebeth Koller Other Clinician: Referring Physician: Elizebeth Koller Treating Physician/Extender: Loistine Chance in Treatment: 2 Vascular Assessment Pulses: Posterior Tibial Dorsalis Pedis Palpable: [Left:No] Doppler: [Left:Multiphasic] Extremity colors, hair growth, and conditions: Extremity Color: [Left:Mottled] Temperature of Extremity: [Left:Warm] Capillary Refill: [Left:> 3 seconds] Toe Nail Assessment Left: Right: Thick: No Discolored: No Deformed: No Improper Length and Hygiene: No Electronic Signature(s) Signed: 04/20/2016 5:18:57 PM By: Alric Quan Entered By: Alric Quan on 04/20/2016 14:21:51 [name] ([id number]) -------------------------------------------------------------------------------- Multi Wound Chart Details Patient Name: [name] Date of Service: 04/20/2016 1:30 PM Medical Record Number: [id number] Patient Account Number: [id number] Date of Birth/Sex: 08/02/1919 (82 y.o. Female) Treating RN: Ahmed Prima Primary Care Physician: Elizebeth Koller Other Clinician: Referring Physician: Elizebeth Koller Treating Physician/Extender: Loistine Chance in Treatment: 2 Vital Signs Height(in): 64 Pulse(bpm): 64 Weight(lbs): 158 Blood Pressure 171/54 (mmHg): Body Mass Index(BMI): 27 Temperature(F): 97.7 Respiratory Rate 16 (breaths/min): Photos: [1:No Photos] [2:No Photos] [N/A:N/A] Wound Location: [1:Left Malleolus] [2:Left Toe Fifth - Lateral] [N/A:N/A] Wounding Event: [1:Gradually Appeared] [2:Gradually Appeared] [N/A:N/A] Primary Etiology: [1:Venous Leg Ulcer] [2:Arterial Insufficiency Ulcer N/A] Comorbid History: [1:Cataracts, Hypertension, Osteoarthritis] [2:Cataracts, Hypertension, N/A Osteoarthritis] Date Acquired: [1:04/07/2015] [2:04/07/2015] [N/A:N/A] Weeks of Treatment: [1:2] [2:2] [N/A:N/A] Wound Status: [1:Open] [2:Open] [N/A:N/A] Measurements L x W x D 0.5x0.5x0.2 [2:0.4x0.3x0.3] [N/A:N/A] (cm) Area (cm) : [  1:0.196] [2:0.094] [N/A:N/A] Volume (cm) : [1:0.039] [2:0.028] [N/A:N/A] % Reduction in Area: [1:49.10%] [2:-100.00%] [N/A:N/A] % Reduction in Volume: 49.40% [2:-211.10%] [N/A:N/A] Classification: [1:Partial Thickness] [2:Partial Thickness] [N/A:N/A] Exudate Amount: [1:Large] [2:Large] [N/A:N/A] Exudate Type: [1:Serous] [2:Serous] [N/A:N/A] Exudate Color: [1:amber] [2:amber] [N/A:N/A] Wound Margin: [1:Thickened] [2:Thickened] [N/A:N/A] Granulation Amount: [1:None Present (0%)] [2:None Present (0%)] [N/A:N/A] Necrotic Amount: [1:Large (67-100%)] [2:Large (67-100%)] [N/A:N/A] Exposed Structures: [1:Fascia: No Fat: No Tendon: No Muscle: No Joint: No Bone: No Limited to Skin Breakdown] [2:Fascia: No Fat: No Tendon: No Muscle: No Joint: No Bone: No Limited to Skin Breakdown] [N/A:N/A] Epithelialization: None None N/A Periwound Skin Texture: Edema: Yes Edema: Yes N/A Periwound Skin No Abnormalities Noted No Abnormalities Noted N/A Moisture: Periwound Skin  Color: Erythema: Yes Erythema: Yes N/A Erythema Location: Circumferential Circumferential N/A Temperature: No Abnormality No Abnormality N/A Tenderness on Yes Yes N/A Palpation: Wound Preparation: Ulcer Cleansing: Ulcer Cleansing: N/A Rinsed/Irrigated with Rinsed/Irrigated with Saline Saline Topical Anesthetic Topical Anesthetic Applied: Other: lidocaine Applied: Other: lidocaine 4% 4% Treatment Notes Electronic Signature(s) Signed: 04/20/2016 5:18:57 PM By: Alric Quan Entered By: Alric Quan on 04/20/2016 14:26:26 [name] ([id number]) -------------------------------------------------------------------------------- [place] Details Patient Name: [name] Date of Service: 04/20/2016 1:30 PM Medical Record Number: [id number] Patient Account Number: [id number] Date of Birth/Sex: 06-Jul-1919 (82 y.o. Female) Treating RN: Carolyne Fiscal, Debi Primary Care Physician: Elizebeth Koller Other Clinician: Referring Physician: Elizebeth Koller Treating Physician/Extender: Loistine Chance in Treatment: 2 Active Inactive Abuse / Safety / Falls / Self Care Management Nursing Diagnoses: Potential for falls Goals: Patient will remain injury free Date Initiated: 04/06/2016 Goal Status: Active Interventions: Assess fall risk on admission and as needed Notes: Nutrition Nursing Diagnoses: Imbalanced nutrition Goals: Patient/caregiver agrees to and verbalizes understanding of need to use nutritional supplements and/or vitamins as prescribed Date Initiated: 04/06/2016 Goal Status: Active Interventions: Assess patient nutrition upon admission and as needed per policy Notes: Orientation to the Wound Care Program Nursing Diagnoses: Knowledge deficit related to the wound healing center program Goals: Patient/caregiver will verbalize understanding of the [place] ([id number]) Date Initiated: 04/06/2016 Goal  Status: Active Interventions: Provide education on orientation to the wound center Notes: Pain, Acute or Chronic Nursing Diagnoses: Pain, acute or chronic: actual or potential Potential alteration in comfort, pain Goals: Patient will verbalize adequate pain control and receive pain control interventions during procedures as needed Date Initiated: 04/06/2016 Goal Status: Active Interventions: Assess comfort goal upon admission Complete pain assessment as per visit requirements Notes: Wound/Skin Impairment Nursing Diagnoses: Impaired tissue integrity Goals: Ulcer/skin breakdown will have a volume reduction of 30% by week 4 Date Initiated: 04/06/2016 Goal Status: Active Ulcer/skin breakdown will have a volume reduction of 50% by week 8 Date Initiated: 04/06/2016 Goal Status: Active Ulcer/skin breakdown will have a volume reduction of 80% by week 12 Date Initiated: 04/06/2016 Goal Status: Active Interventions: Assess patient/caregiver ability to obtain necessary supplies Assess ulceration(s) every visit Notes: [name], [name] ([id number]) Electronic Signature(s) Signed: 04/20/2016 5:18:57 PM By: Alric Quan Entered By: Alric Quan on 04/20/2016 14:26:20 [name] ([id number]) -------------------------------------------------------------------------------- Pain Assessment Details Patient Name: [name] Date of Service: 04/20/2016 1:30 PM Medical Record Number: [id number] Patient Account Number: [id number] Date of Birth/Sex: 04/10/1919 (82 y.o. Female) Treating RN: Ahmed Prima Primary Care Physician: Elizebeth Koller Other Clinician: Referring Physician: Elizebeth Koller Treating Physician/Extender: Loistine Chance in Treatment: 2 Active Problems Location of Pain Severity and Description of Pain Patient Has Paino No Site Locations With Dressing Change: No Pain Management and Medication  Current Pain Management: Notes Topical or  injectable lidocaine is offered to patient for acute pain when surgical debridement is performed. If needed, Patient is instructed to use over the counter pain medication for the following 24-48 hours after debridement. Wound care MDs do not prescribed pain medications. Electronic Signature(s) Signed: 04/20/2016 5:18:57 PM By: Alric Quan Entered By: Alric Quan on 04/20/2016 14:18:02 [name] ([id number]) -------------------------------------------------------------------------------- Patient/Caregiver Education Details Patient Name: [name] Date of Service: 04/20/2016 1:30 PM Medical Record Number: [id number] Patient Account Number: [id number] Date of Birth/Gender: 1918/12/20 (82 y.o. Female) Treating RN: Ahmed Prima Primary Care Physician: Elizebeth Koller Other Clinician: Referring Physician: Elizebeth Koller Treating Physician/Extender: Loistine Chance in Treatment: 2 Education Assessment Education Provided To: Patient Education Topics Provided Wound/Skin Impairment: Handouts: Other: change dressing as ordered Methods: Demonstration, Explain/Verbal Responses: State content correctly Electronic Signature(s) Signed: 04/20/2016 5:18:57 PM By: Alric Quan Entered By: Alric Quan on 04/20/2016 14:37:18 [name] ([id number]) -------------------------------------------------------------------------------- Wound Assessment Details Patient Name: [name] Date of Service: 04/20/2016 1:30 PM Medical Record Number: [id number] Patient Account Number: [id number] Date of Birth/Sex: 11/21/1918 (82 y.o. Female) Treating RN: Carolyne Fiscal, Debi Primary Care Physician: Elizebeth Koller Other Clinician: Referring Physician: Elizebeth Koller Treating Physician/Extender: Loistine Chance in Treatment: 2 Wound Status Wound Number: 1 Primary Venous Leg Ulcer Etiology: Wound Location: Left Malleolus Wound Status: Open Wounding  Event: Gradually Appeared Comorbid Cataracts, Hypertension, Date Acquired: 04/07/2015 History: Osteoarthritis Weeks Of Treatment: 2 Clustered Wound: No Photos Photo Uploaded By: Alric Quan on 04/20/2016 16:47:35 Wound Measurements Length: (cm) 0.5 Width: (cm) 0.5 Depth: (cm) 0.2 Area: (cm) 0.196 Volume: (cm) 0.039 % Reduction in Area: 49.1% % Reduction in Volume: 49.4% Epithelialization: None Tunneling: No Undermining: No Wound Description Classification: Partial Thickness Wound Margin: Thickened Exudate Amount: Large Exudate Type: Serous Exudate Color: amber Foul Odor After Cleansing: No Wound Bed Granulation Amount: None Present (0%) Exposed Structure Necrotic Amount: Large (67-100%) Fascia Exposed: No Necrotic Quality: Adherent Slough Fat Layer Exposed: No Tendon Exposed: No [name], [name] ([id number]) Muscle Exposed: No Joint Exposed: No Bone Exposed: No Limited to Skin Breakdown Periwound Skin Texture Texture Color No Abnormalities Noted: No No Abnormalities Noted: No Localized Edema: Yes Erythema: Yes Erythema Location: Circumferential Moisture No Abnormalities Noted: No Temperature / Pain Temperature: No Abnormality Tenderness on Palpation: Yes Wound Preparation Ulcer Cleansing: Rinsed/Irrigated with Saline Topical Anesthetic Applied: Other: lidocaine 4%, Treatment Notes Wound #1 (Left Malleolus) 1. Cleansed with: Clean wound with Normal Saline 2. Anesthetic Topical Lidocaine 4% cream to wound bed prior to debridement 4. Dressing Applied: Prisma Ag 5. Secondary Dressing Applied Dry Gauze Foam Kerlix/Conform 7. Secured with Tape Notes netting Electronic Signature(s) Signed: 04/20/2016 5:18:57 PM By: Alric Quan Entered By: Alric Quan on 04/20/2016 14:24:28 [name] ([id number]) -------------------------------------------------------------------------------- Wound Assessment Details Patient Name: [name] Date of Service: 04/20/2016 1:30 PM Medical Record Number: [id number] Patient Account Number: [id number] Date of Birth/Sex: [birth year] (82 y.o. Female) Treating RN: Carolyne Fiscal, Debi Primary Care Physician: Elizebeth Koller Other Clinician: Referring Physician: Elizebeth Koller Treating Physician/Extender: Loistine Chance in Treatment: 2 Wound Status Wound Number: 2 Primary Arterial Insufficiency Ulcer Etiology: Wound Location: Left Toe Fifth - Lateral Wound Status: Open Wounding Event: Gradually Appeared Comorbid Cataracts, Hypertension, Date Acquired: 04/07/2015 History: Osteoarthritis Weeks Of Treatment: 2 Clustered Wound: No Photos Photo Uploaded By: Alric Quan on 04/20/2016 16:48:06 Wound Measurements Length: (cm) 0.4 Width: (cm) 0.3 Depth: (cm) 0.3 Area: (cm) 0.094 Volume: (cm) 0.028 % Reduction in Area: -100% % Reduction in Volume: -211.1% Epithelialization:  None Tunneling: No Undermining: No Wound Description Classification: Partial Thickness Foul Odor Af Wound Margin: Thickened Exudate Amount: Large Exudate Type: Serous Exudate Color: amber ter Cleansing: No Wound Bed Granulation Amount: None Present (0%) Exposed Structure Necrotic Amount: Large (67-100%) Fascia Exposed: No Necrotic Quality: Adherent Slough Fat Layer Exposed: No Tendon Exposed: No [name], [name] ([id number]) Muscle Exposed: No Joint Exposed: No Bone Exposed: No Limited to Skin Breakdown Periwound Skin Texture Texture Color No Abnormalities Noted: No No Abnormalities Noted: No Localized Edema: Yes Erythema: Yes Erythema Location: Circumferential Moisture No Abnormalities Noted: No Temperature / Pain Temperature: No Abnormality Tenderness on Palpation: Yes Wound Preparation Ulcer Cleansing: Rinsed/Irrigated with Saline Topical Anesthetic Applied: Other: lidocaine 4%, Treatment Notes Wound #2 (Left, Lateral Toe Fifth) 1. Cleansed with: Clean wound with  Normal Saline 2. Anesthetic Topical Lidocaine 4% cream to wound bed prior to debridement 4. Dressing Applied: Prisma Ag 5. Secondary Dressing Applied Dry Gauze Foam Kerlix/Conform 7. Secured with Tape Notes netting Electronic Signature(s) Signed: 04/20/2016 5:18:57 PM By: Alric Quan Entered By: Alric Quan on 04/20/2016 14:25:31 [name] ([id number]) -------------------------------------------------------------------------------- Vit[place] Details Patient Name: [name] Date of Service: 04/20/2016 1:30 PM Medical Record Number: [id number] Patient Account Number: [id number] Date of Birth/Sex: Sep 21, 1919 (82 y.o. Female) Treating RN: Carolyne Fiscal, Debi Primary Care Physician: Elizebeth Koller Other Clinician: Referring Physician: Elizebeth Koller Treating Physician/Extender: Loistine Chance in Treatment: 2 Vital Signs Time Taken: 14:18 Temperature (F): 97.7 Height (in): 64 Pulse (bpm): 64 Weight (lbs): 158 Respiratory Rate (breaths/min): 16 Body Mass Index (BMI): 27.1 Blood Pressure (mmHg): 171/54 Reference Range: 80 - 120 mg / dl Electronic Signature(s) Signed: 04/20/2016 5:18:57 PM By: Alric Quan Entered By: Alric Quan on 04/20/2016 14:19:56

## 2016-04-27 ENCOUNTER — Encounter: Payer: Medicare PPO | Admitting: Internal Medicine

## 2016-04-27 DIAGNOSIS — I70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: Secondary | ICD-10-CM | POA: Diagnosis not present

## 2016-04-28 NOTE — Progress Notes (Signed)
MESSINA, MARREN ([id number]) Visit Report for 04/27/2016 Chief Complaint Document Details Patient Name: [name], [name] Date of Service: 04/27/2016 3:00 PM Medical Record Patient Account Number: [id number] Number: Treating RN: Ahmed Prima Jul 09, 1919 (82 y.o. Other Clinician: Date of Birth/Sex: Female) Treating Jerl Munyan Primary Care Physician/Extender: Windell Moment Physician: Referring Physician: Laverle Hobby in Treatment: 3 Information Obtained from: Patient Chief Complaint 04/06/16; the patient is here for review of wounds on the left lateral ankle and left great toe that been present for a year Electronic Signature(s) Signed: 04/27/2016 4:29:49 PM By: [name] MD Entered By: [name] on 04/27/2016 15:44:32 [name] ([id number]) -------------------------------------------------------------------------------- Debridement Details Patient Name: [name] Date of Service: 04/27/2016 3:00 PM Medical Record Patient Account Number: [id number] Number: Treating RN: Carolyne Fiscal, Debi Feb 18, 1919 (82 y.o. Other Clinician: Date of Birth/Sex: Female) Treating Giulio Bertino Primary Care Physician/Extender: Derrill Memo, CHARLES Physician: Referring Physician: Laverle Hobby in Treatment: 3 Debridement Performed for Wound #2 Left,Lateral Toe Fifth Assessment: Performed By: Physician Ricard Dillon, MD Debridement: Debridement Pre-procedure Yes Verification/Time Out Taken: Start Time: 15:28 Pain Control: Other : lidocaine 4% cream Level: Skin/Subcutaneous Tissue Total Area Debrided (L x 0.5 (cm) x 0.5 (cm) = 0.25 (cm) W): Tissue and other Viable, Non-Viable, Exudate, Fibrin/Slough, Subcutaneous material debrided: Instrument: Curette Bleeding: Minimum Hemostasis Achieved: Pressure End Time: 15:29 Procedural Pain: 0 Post Procedural Pain: 0 Response to Treatment: Procedure was tolerated  well Post Debridement Measurements of Total Wound Length: (cm) 0.5 Width: (cm) 0.5 Depth: (cm) 0.3 Volume: (cm) 0.059 Post Procedure Diagnosis Same as Pre-procedure Electronic Signature(s) Signed: 04/27/2016 4:29:49 PM By: [name] MD Signed: 04/27/2016 5:02:09 PM By: Alric Quan Entered By: [name] on 04/27/2016 15:41:15 [name] ([id number]) [name] ([id number]) -------------------------------------------------------------------------------- HPI Details Patient Name: [name] Date of Service: 04/27/2016 3:00 PM Medical Record Patient Account Number: [id number] Number: Treating RN: Ahmed Prima May 30, 1919 (82 y.o. Other Clinician: Date of Birth/Sex: Female) Treating Tadeusz Stahl Primary Care Physician/Extender: Windell Moment Physician: Referring Physician: Laverle Hobby in Treatment: 3 History of Present Illness HPI Description: 04/06/16; this is a 82 year old woman who arrives accompanied by 2 daughters for a wound on her left ankle and her left fifth toe. These have apparently been present for a year. I'm not quite certain how she came to this clinic however she was being followed by Sharlotte Alamo her podiatrist for these wounds. She was also referred to [facility] and they apparently did a test presumably arterial studies although we don't have any of these results and we couldn't get through to the office today. The family but has been applying a combination of Bactroban and a light bandage and perhaps more recently Silvadene cream. She did have an x-ray of the foot roughly 6 months ago at the podiatry office the family was unaware that if there were any abnormalities. Apparently they have not seen any healing here. Our intake nurse noted a slight skin tear on the right anterior lower leg. Nobody seemed aware of this. ABIs calculated in this clinic was 0.3 on the right and 0.4 on the  left I have reviewed things [facility]. There is very little information on this patient. She apparently follows in current total clinic which we don't have information from. She has mentioned already been to a [facility]. She has a history of hypothyroidism, nephrolithiasis arthritis and has had a previous mastectomy. 04/13/16; patient's x-ray was normal. She is already been to see Dr.  Schnier vascular surgery. Her arterial exam was from November 2016 this showed a left ABI of 0.62 her right of 0.76. Her duplex ultrasound of the left leg showed biphasic waves in the common femoral and distal femoral artery however monophasic waves in the superficial femoral artery proximal and mid biphasic it distal. 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 The patient tells me that she has pain at night when she tries to lie down which is improved by getting up and sitting in the chair this sounds like claudication at rest. Her wounds are on the left medial malleolus and the dorsal fifth toe small punched out wounds that are right on bone. We use Santyl last week 04/20/16: nurse informed me pt has declined evaluation for significant PAD. she denies systemic s/s of infections. 04/27/16;; the patient has had noninvasive arterial studies done in November 2016. ABI and the left was 0.62. Monophasic waves at the superficial femoral artery. Occluded to the posterior tibial artery. So had greater than 50% stenosis of the right superficial femoral and greater than 50% stenosis of the left superficial femoral artery. She had bilateral tibial peroneal artery disease. Both of the wounds on the left fifth toe and left lateral malleolus have been present for more than a year. They have been to see vein and vascular. The patient has some pain but miraculously I think the wounds have largely been stable. No evidence of infection Electronic Signature(s) Signed: 04/27/2016 4:29:49 PM By: [name] MD [name], [name]  ([id number]) Entered By: [name] on 04/27/2016 15:52:36 [name] ([id number]) -------------------------------------------------------------------------------- Physical Exam Details Patient Name: [name] Date of Service: 04/27/2016 3:00 PM Medical Record Patient Account Number: [id number] Number: Treating RN: Ahmed Prima 02-04-1919 (82 y.o. Other Clinician: Date of Birth/Sex: Female) Treating Chellsie Gomer Primary Care Physician/Extender: Windell Moment Physician: Referring Physician: Laverle Hobby in Treatment: 3 Notes Wound exam; these are pale punched out lesions. I removed some necrotic tissue of the left fifth toe and some circumferential skin however these are not going to heal there is no reasonable expectation that they should be at this point. Is no evidence of surrounding infection Electronic Signature(s) Signed: 04/27/2016 4:29:49 PM By: [name] MD Entered By: [name] on 04/27/2016 15:53:22 [name] ([id number]) -------------------------------------------------------------------------------- Physician Orders Details Patient Name: [name] Date of Service: 04/27/2016 3:00 PM Medical Record Patient Account Number: [id number] Number: Treating RN: Ahmed Prima [birth year] (82 y.o. 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 - for clinic use o Cleanse wound with mild soap and water o May Shower, gently pat wound dry prior to applying new dressing. Wound #2 Left,Lateral Toe Fifth o Clean wound with Normal Saline. - for clinic use o Cleanse wound with mild soap and water o May Shower, gently pat wound dry prior to applying new dressing. Anesthetic Wound #1 Left Malleolus o Topical Lidocaine 4% cream applied to wound bed  prior to debridement - for clinic use Wound #2 Left,Lateral Toe Fifth o Topical Lidocaine 4% cream applied to wound bed prior to debridement - for clinic use Skin Barriers/Peri-Wound Care Wound #1 Left Malleolus o Barrier cream Wound #2 Left,Lateral Toe Fifth o Barrier cream Primary Wound Dressing [name], [name] ([id number]) Wound #1 Left Malleolus o Prisma Ag - moisten with saline Wound #2 Left,Lateral Toe Fifth o Prisma Ag - moisten with saline Secondary Dressing Wound #1 Left Malleolus o Gauze and Kerlix/Conform - netting, tape o Foam Wound #2 Left,Lateral Toe Fifth o Gauze and Kerlix/Conform o Foam Dressing Change Frequency Wound #1 Left Malleolus o Change dressing every other day. Wound #2 Left,Lateral Toe Fifth o Change dressing every other day. Follow-up Appointments Wound #1 Left Malleolus o Return Appointment in 1 week. Wound #2 Left,Lateral Toe Fifth o Return Appointment in 1 week. Edema Control Wound #1 Left Malleolus o Elevate legs to the level of the heart and pump ankles as often as possible o Other: - compression stockings Wound #2 Left,Lateral Toe Fifth o Elevate legs to the level of the heart and pump ankles as often as possible o Other: - compression stockings Additional Orders / Instructions Wound #1 Left Malleolus o Increase protein intake. Wound #2  Left,Lateral Toe Fifth o Increase protein intake. Medications-please add to medication list. Wound #1 Left Malleolus [name], [name] ([id number]) o Other: - Vitamin C, Zinc, Multivitamins Wound #2 Left,Lateral Toe Fifth o Other: - Vitamin C, Zinc, Multivitamins Services and Therapies o Arterial Studies- Bilateral - Dr. Tyrell Antonio office Electronic Signature(s) Signed: 04/27/2016 4:29:49 PM By: [name] MD Signed: 04/27/2016 5:02:09 PM By: Alric Quan Entered By: Alric Quan on 04/27/2016 15:59:20 [name] ([id number]) -------------------------------------------------------------------------------- Problem List Details Patient Name: [name] Date of Service: 04/27/2016 3:00 PM Medical Record Patient Account Number: [id number] Number: Treating RN: Carolyne Fiscal, Debi 18-Nov-1918 (82 y.o. Other Clinician: Date of Birth/Sex: Female) Treating Emilyrose Darrah Primary Care Physician/Extender: Windell Moment Physician: Referring Physician: Laverle Hobby in Treatment: 3 Active Problems ICD-10 Encounter Code Description Active Date Diagnosis I70.245 Atherosclerosis of native arteries of left leg with ulceration 04/06/2016 Yes of other part of foot I70.243 Atherosclerosis of native arteries of left leg with ulceration 04/06/2016 Yes of ankle L97.523 Non-pressure chronic ulcer of other part of left foot with 04/06/2016 Yes necrosis of muscle L97.211 Non-pressure chronic ulcer of right calf limited to 04/06/2016 Yes breakdown of skin Inactive Problems Resolved Problems Electronic Signature(s) Signed: 04/27/2016 4:29:49 PM By: [name] MD Entered By: [name] on 04/27/2016 15:40:35 [name] ([id number]) -------------------------------------------------------------------------------- Progress Note Details Patient Name: [name] Date of Service: 04/27/2016 3:00 PM Medical Record Patient Account Number:  [id number] Number: Treating RN: Carolyne Fiscal, Debi December 26, 1918 (82 y.o. Other Clinician: Date of Birth/Sex: Female) Treating Adia Crammer Primary Care Physician/Extender: Windell Moment Physician: Referring Physician: Laverle Hobby in Treatment: 3 Subjective Chief Complaint Information obtained from Patient 04/06/16; the patient is here for review of wounds on the left lateral ankle and left great toe that been present for a year History of Present Illness (HPI) 04/06/16; this is a 82 year old woman who arrives accompanied by 2 daughters for a wound on her left ankle and her left fifth toe. These have apparently been present for a year. I'm not quite certain how she came to this clinic however she was being followed by Sharlotte Alamo her podiatrist for these wounds. She was also referred to [facility] and they apparently did a test presumably  arterial studies although we don't have any of these results and we couldn't get through to the office today. The family but has been applying a combination of Bactroban and a light bandage and perhaps more recently Silvadene cream. She did have an x-ray of the foot roughly 6 months ago at the podiatry office the family was unaware that if there were any abnormalities. Apparently they have not seen any healing here. Our intake nurse noted a slight skin tear on the right anterior lower leg. Nobody seemed aware of this. ABIs calculated in this clinic was 0.3 on the right and 0.4 on the left I have reviewed things [facility]. There is very little information on this patient. She apparently follows in current total clinic which we don't have information from. She has mentioned already been to a [facility]. She has a history of hypothyroidism, nephrolithiasis arthritis and has had a previous mastectomy. 04/13/16; patient's x-ray was normal. She is already been to see Dr. Delana Meyer vascular surgery. Her arterial exam was  from November 2016 this showed a left ABI of 0.62 her right of 0.76. Her duplex ultrasound of the left leg showed biphasic waves in the common femoral and distal femoral artery however monophasic waves in the superficial femoral artery proximal and mid biphasic it distal. 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 The patient tells me that she has pain at night when she tries to lie down which is improved by getting up and sitting in the chair this sounds like claudication at rest. Her wounds are on the left medial malleolus and the dorsal fifth toe small punched out wounds that are right on bone. We use Santyl last week 04/20/16: nurse informed me pt has declined evaluation for significant PAD. she denies systemic s/s of infections. 04/27/16;; the patient has had noninvasive arterial studies done in November 2016. ABI and the left was 0.62. Monophasic waves at the superficial femoral artery. Occluded to the posterior tibial artery. So had greater than 50% stenosis of the right superficial femoral and greater than 50% stenosis of the left superficial femoral artery. She had bilateral tibial peroneal artery disease. Both of the wounds on the left [name], [name] ([id number]) fifth toe and left lateral malleolus have been present for more than a year. They have been to see vein and vascular. The patient has some pain but miraculously I think the wounds have largely been stable. No evidence of infection Objective Constitutional Vitals Time Taken: 3:12 PM, Height: 64 in, Weight: 158 lbs, BMI: 27.1, Temperature: 97.6 F, Pulse: 75 bpm, Respiratory Rate: 16 breaths/min, Blood Pressure: 166/55 mmHg. Integumentary (Hair, Skin) Wound #1 status is Open. Original cause of wound was Gradually Appeared. The wound is located on the Left Malleolus. The wound measures 0.7cm length x 0.7cm width x 0.2cm depth; 0.385cm^2 area and 0.077cm^3 volume. The wound is limited to skin breakdown. There is no tunneling or  undermining noted. There is a large amount of serous drainage noted. The wound margin is thickened. There is no granulation within the wound bed. There is a large (67-100%) amount of necrotic tissue within the wound bed including Adherent Slough. The periwound skin appearance exhibited: Localized Edema, Erythema. The surrounding wound skin color is noted with erythema which is circumferential. Periwound temperature was noted as No Abnormality. The periwound has tenderness on palpation. Wound #2 status is Open. Original cause of wound was Gradually Appeared. The wound is located on the Left,Lateral Toe Fifth. The wound measures 0.5cm length x 0.5cm width  x 0.3cm depth; 0.196cm^2 area and 0.059cm^3 volume. The wound is limited to skin breakdown. There is a large amount of serous drainage noted. The wound margin is thickened. There is no granulation within the wound bed. There is a large (67-100%) amount of necrotic tissue within the wound bed including Adherent Slough. The periwound skin appearance exhibited: Localized Edema, Erythema. The surrounding wound skin color is noted with erythema which is circumferential. Periwound temperature was noted as No Abnormality. The periwound has tenderness on palpation. 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 There was a Skin/Subcutaneous Tissue Debridement [id number]) debridement with total area of 0.25 sq cm performed by Ricard Dillon, MD. with the following instrument(s): Curette to  remove Viable and Non-Viable tissue/material including Exudate, Fibrin/Slough, and Subcutaneous after achieving pain control using Other (lidocaine 4% cream). A time out was conducted prior to the start of the procedure. A Minimum amount of bleeding was controlled with Pressure. The procedure was tolerated well with a pain level of 0 throughout and a pain level of 0 following the procedure. Post Debridement Measurements: 0.5cm length x 0.5cm width x 0.3cm depth; 0.059cm^3 volume. Post procedure Diagnosis Wound #2: Same as Pre-Procedure Plan Wound Cleansing: Wound #1 Left Malleolus: Clean wound with Normal Saline. - for clinic use Cleanse wound with mild soap and water May Shower, gently pat wound dry prior to applying new dressing. Wound #2 Left,Lateral Toe Fifth: Clean wound with Normal Saline. - for clinic use Cleanse wound with mild soap and water May Shower, gently pat wound dry prior to applying new dressing. Anesthetic: Wound #1 Left Malleolus: Topical Lidocaine 4% cream applied to wound bed prior to debridement - for clinic use Wound #2 Left,Lateral Toe Fifth: Topical Lidocaine 4% cream applied to wound bed prior to debridement - for clinic use Skin Barriers/Peri-Wound Care: Wound #1 Left Malleolus: Barrier cream Wound #2 Left,Lateral Toe Fifth: Barrier cream Primary Wound Dressing: Wound #1 Left Malleolus: Prisma Ag - moisten with saline Wound #2 Left,Lateral Toe Fifth: Prisma Ag - moisten with saline Secondary Dressing: Wound #1 Left Malleolus: Gauze and Kerlix/Conform - netting, tape [name], [name] ([id number]) Foam Wound #2 Left,Lateral Toe Fifth: Gauze and Kerlix/Conform Foam Dressing Change Frequency: Wound #1 Left Malleolus: Change dressing every other day. Wound #2 Left,Lateral Toe Fifth: Change dressing every other day. Follow-up Appointments: Wound #1 Left Malleolus: Return Appointment in 1 week. Wound #2 Left,Lateral Toe Fifth: Return Appointment in  1 week. Edema Control: Wound #1 Left Malleolus: Elevate legs to the level of the heart and pump ankles as often as possible Other: - compression stockings Wound #2 Left,Lateral Toe Fifth: Elevate legs to the level of the heart and pump ankles as often as possible Other: - compression stockings Additional Orders / Instructions: Wound #1 Left Malleolus: Increase protein intake. Wound #2 Left,Lateral Toe Fifth: Increase protein intake. Medications-please add to medication list.: Wound #1 Left Malleolus: Other: - Vitamin C, Zinc, Multivitamins Wound #2 Left,Lateral Toe Fifth: Other: - Vitamin C, Zinc, Multivitamins I would like to refer him to Dr. Fletcher Anon for a second opinion. The  options would be palliative wound care and hope for the best. The current daughter seems want to have something done in spite of her advanced age. To be truthful she looks otherwise very well. Electronic Signature(s) Signed: 04/27/2016 4:29:49 PM By: [name] MD Entered By: [name] on 04/27/2016 15:55:47 [name] ([id number]) -------------------------------------------------------------------------------- [facility] Details Patient Name: [name] Date of Service: 04/27/2016 Medical Record Patient Account Number: [id number] Number: Treating RN: Ahmed Prima [birth year] (82 y.o. 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 right calf limited to breakdown of skin Facility  Procedures CPT4 Code Description: [id number] 11042 - DEB SUBQ TISSUE 20 SQ CM/< ICD-10 Description Diagnosis L97.523 Non-pressure chronic ulcer of other part of left foot w Modifier: ith necrosis of m Quantity: 1 uscle Physician Procedures CPT4 Code Description: [id number] 11042 - WC PHYS SUBQ TISS 20 SQ CM ICD-10 Description Diagnosis L97.523 Non-pressure chronic ulcer of other part of left foot wi Modifier: th necrosis of m Quantity: 1 uscle Electronic Signature(s) Signed: 04/27/2016 4:29:49 PM By: [name] MD Entered By: [name] on 04/27/2016 15:56:37

## 2016-04-28 NOTE — Progress Notes (Signed)
TITA, HILLIER ([id number]) Visit Report for 04/27/2016 Arrival Information Details Patient Name: [name], [name] Date of Service: 04/27/2016 3:00 PM Medical Record Patient Account Number: [id number] Number: Treating RN: Ahmed Prima 02-13-1919 (82 y.o. Other Clinician: Date of Birth/Sex: Female) Treating ROBSON, MICHAEL Primary Care Physician: Elizebeth Koller Physician/Extender: G Referring Physician: Laverle Hobby in Treatment: 3 Visit Information History Since Last Visit All ordered tests and consults were completed: No Patient Arrived: Ambulatory Added or deleted any medications: No Arrival Time: 15:07 Any new allergies or adverse reactions: No Accompanied By: daughter Had a fall or experienced change in No Transfer Assistance: None activities of daily living that may affect Patient Identification Verified: Yes risk of falls: Secondary Verification Process Yes Signs or symptoms of abuse/neglect since last No Completed: visito Patient Requires Transmission-Based No Hospitalized since last visit: No Precautions: Pain Present Now: No Patient Has Alerts: No Electronic Signature(s) Signed: 04/27/2016 5:02:09 PM By: Alric Quan Entered By: Alric Quan on 04/27/2016 15:12:27 [name] ([id number]) -------------------------------------------------------------------------------- Encounter Discharge Information Details Patient Name: [name] Date of Service: 04/27/2016 3:00 PM Medical Record Patient Account Number: [id number] Number: Treating RN: Ahmed Prima April 07, 1919 (82 y.o. Other Clinician: Date of Birth/Sex: Female) Treating ROBSON, MICHAEL Primary Care Physician: Elizebeth Koller Physician/Extender: G Referring Physician: Laverle Hobby in Treatment: 3 Encounter Discharge Information Items Discharge Pain Level: 0 Discharge Condition: Stable Ambulatory Status: Ambulatory Discharge  Destination: Home Transportation: Private Auto Accompanied By: self Schedule Follow-up Appointment: Yes Medication Reconciliation completed and provided to Patient/Care Yes Jeris Roser: Provided on Clinical Summary of Care: 04/27/2016 Form Type Recipient Paper Patient [facility] Electronic Signature(s) Signed: 04/27/2016 3:46:00 PM By: Ruthine Dose Entered By: Ruthine Dose on 04/27/2016 15:45:59 [name] ([id number]) -------------------------------------------------------------------------------- Lower Extremity Assessment Details Patient Name: [name] Date of Service: 04/27/2016 3:00 PM Medical Record Patient Account Number: [id number] Number: Treating RN: Ahmed Prima 06/25/1919 (82 y.o. Other Clinician: Date of Birth/Sex: Female) Treating ROBSON, MICHAEL Primary Care Physician: Elizebeth Koller Physician/Extender: G Referring Physician: Laverle Hobby in Treatment: 3 Vascular Assessment Pulses: Posterior Tibial Dorsalis Pedis Palpable: [Left:No] Doppler: [Left:Multiphasic] Extremity colors, hair growth, and conditions: Extremity Color: [Left:Mottled] Temperature of Extremity: [Left:Warm] Capillary Refill: [Left:> 3 seconds] Toe Nail Assessment Left: Right: Thick: No Discolored: No Deformed: No Improper Length and Hygiene: No Electronic Signature(s) Signed: 04/27/2016 5:02:09 PM By: Alric Quan Entered By: Alric Quan on 04/27/2016 15:21:02 [name] ([id number]) -------------------------------------------------------------------------------- Multi Wound Chart Details Patient Name: [name] Date of Service: 04/27/2016 3:00 PM Medical Record Patient Account Number: [id number] Number: Treating RN: Ahmed Prima [birth year] (82 y.o. Other Clinician: Date of Birth/Sex: Female) Treating ROBSON, [place] Primary Care Physician: Elizebeth Koller Physician/Extender: G Referring Physician: Laverle Hobby in Treatment: 3 Vital Signs Height(in): 64 Pulse(bpm): 75 Weight(lbs): 158 Blood Pressure 166/55 (mmHg): Body Mass Index(BMI): 27 Temperature(F): 97.6 Respiratory Rate 16 (breaths/min): Photos: [1:No Photos] [2:No Photos] [N/A:N/A] Wound Location: [1:Left Malleolus] [2:Left Toe Fifth - Lateral] [N/A:N/A] Wounding Event: [1:Gradually Appeared] [2:Gradually Appeared] [N/A:N/A] Primary Etiology: [1:Venous Leg Ulcer] [2:Arterial Insufficiency Ulcer N/A] Comorbid History: [1:Cataracts, Hypertension, Osteoarthritis] [2:Cataracts, Hypertension, N/A Osteoarthritis] Date Acquired: [1:04/07/2015] [2:04/07/2015] [N/A:N/A] Weeks of Treatment: [1:3] [2:3] [N/A:N/A] Wound Status: [1:Open] [2:Open] [N/A:N/A] Measurements L x W x D 0.7x0.7x0.2 [2:0.5x0.5x0.3] [N/A:N/A] (cm) Area (cm) : [1:0.385] [2:0.196] [N/A:N/A] Volume (cm) : [1:0.077] [2:0.059] [N/A:N/A] % Reduction in Area: [1:0.00%] [2:-317.00%] [N/A:N/A] % Reduction in Volume: 0.00% [2:-555.60%] [N/A:N/A] Classification: [1:Partial Thickness] [2:Partial Thickness] [N/A:N/A] Exudate Amount: [1:Large] [2:Large] [N/A:N/A] Exudate Type: [1:Serous] [2:Serous] [N/A:N/A] Exudate Color: [  1:amber] [2:amber] [N/A:N/A] Wound Margin: [1:Thickened] [2:Thickened] [N/A:N/A] Granulation Amount: [1:None Present (0%)] [2:None Present (0%)] [N/A:N/A] Necrotic Amount: [1:Large (67-100%)] [2:Large (67-100%)] [N/A:N/A] Exposed Structures: [1:Fascia: No Fat: No Tendon: No Muscle: No Joint: No Bone: No] [2:Fascia: No Fat: No Tendon: No Muscle: No Joint: No Bone: No] [N/A:N/A] Limited to Skin Limited to Skin Breakdown Breakdown Epithelialization: None None N/A Periwound Skin Texture: Edema: Yes Edema: Yes N/A Periwound Skin No Abnormalities Noted No Abnormalities Noted N/A Moisture: Periwound Skin Color: Erythema: Yes Erythema: Yes N/A Erythema Location: Circumferential Circumferential N/A Temperature: No Abnormality No Abnormality  N/A Tenderness on Yes Yes N/A Palpation: Wound Preparation: Ulcer Cleansing: Ulcer Cleansing: N/A Rinsed/Irrigated with Rinsed/Irrigated with Saline Saline Topical Anesthetic Topical Anesthetic Applied: Other: lidocaine Applied: Other: lidocaine 4% 4% Treatment Notes Electronic Signature(s) Signed: 04/27/2016 5:02:09 PM By: Alric Quan Entered By: Alric Quan on 04/27/2016 15:23:11 [name] ([id number]) -------------------------------------------------------------------------------- Multi-Disciplinary Care Plan Details Patient Name: [name] Date of Service: 04/27/2016 3:00 PM Medical Record Patient Account Number: [id number] Number: Treating RN: Ahmed Prima November 26, 1918 (82 y.o. Other Clinician: Date of Birth/Sex: Female) Treating ROBSON, [place] Primary Care Physician: Elizebeth Koller Physician/Extender: G Referring Physician: Laverle Hobby in Treatment: 3 Active Inactive Abuse / Safety / Falls / Self Care Management Nursing Diagnoses: Potential for falls Goals: Patient will remain injury free Date Initiated: 04/06/2016 Goal Status: Active Interventions: Assess fall risk on admission and as needed Notes: Nutrition Nursing Diagnoses: Imbalanced nutrition Goals: Patient/caregiver agrees to and verbalizes understanding of need to use nutritional supplements and/or vitamins as prescribed Date Initiated: 04/06/2016 Goal Status: Active Interventions: Assess patient nutrition upon admission and as needed per policy Notes: Orientation to the Wound Care Program Nursing Diagnoses: Knowledge deficit related to the wound healing center program MIKI, KNIESS ([id number]) Goals: Patient/caregiver will verbalize understanding of the [place] Date Initiated: 04/06/2016 Goal Status: Active Interventions: Provide education on orientation to the wound center Notes: Pain, Acute or Chronic Nursing  Diagnoses: Pain, acute or chronic: actual or potential Potential alteration in comfort, pain Goals: Patient will verbalize adequate pain control and receive pain control interventions during procedures as needed Date Initiated: 04/06/2016 Goal Status: Active Interventions: Assess comfort goal upon admission Complete pain assessment as per visit requirements Notes: Wound/Skin Impairment Nursing Diagnoses: Impaired tissue integrity Goals: Ulcer/skin breakdown will have a volume reduction of 30% by week 4 Date Initiated: 04/06/2016 Goal Status: Active Ulcer/skin breakdown will have a volume reduction of 50% by week 8 Date Initiated: 04/06/2016 Goal Status: Active Ulcer/skin breakdown will have a volume reduction of 80% by week 12 Date Initiated: 04/06/2016 Goal Status: Active Interventions: Assess patient/caregiver ability to obtain necessary supplies Moran, [place] ([id number]) Assess ulceration(s) every visit Notes: Electronic Signature(s) Signed: 04/27/2016 5:02:09 PM By: Alric Quan Entered By: Alric Quan on 04/27/2016 15:23:03 [name] ([id number]) -------------------------------------------------------------------------------- Pain Assessment Details Patient Name: [name] Date of Service: 04/27/2016 3:00 PM Medical Record Patient Account Number: [id number] Number: Treating RN: Ahmed Prima 02/04/1919 (82 y.o. Other Clinician: Date of Birth/Sex: Female) Treating ROBSON, [place] Primary Care Physician: Elizebeth Koller Physician/Extender: G Referring Physician: Laverle Hobby in Treatment: 3 Active Problems Location of Pain Severity and Description of Pain Patient Has Paino No Site Locations With Dressing Change: No Pain Management and Medication Current Pain Management: Notes Topical or injectable lidocaine is offered to patient for acute pain when surgical debridement is performed. If needed, Patient is instructed  to use over the counter pain medication for the following 24-48 hours after  debridement. Wound care MDs do not prescribed pain medications. Electronic Signature(s) Signed: 04/27/2016 5:02:09 PM By: Alric Quan Entered By: Alric Quan on 04/27/2016 15:12:42 [name] ([id number]) -------------------------------------------------------------------------------- Patient/Caregiver Education Details Patient Name: [name] Date of Service: 04/27/2016 3:00 PM Medical Record Patient Account Number: [id number] Number: Treating RN: Ahmed Prima Sep 14, 1919 (82 y.o. Other Clinician: Date of Birth/Gender: Female) Treating ROBSON, [place] Primary Care Physician: Elizebeth Koller Physician/Extender: G Referring Physician: Laverle Hobby in Treatment: 3 Education Assessment Education Provided To: Patient Education Topics Provided Wound/Skin Impairment: Handouts: Other: change dressing as ordered Methods: Demonstration, Explain/Verbal Responses: State content correctly Electronic Signature(s) Signed: 04/27/2016 5:02:09 PM By: Alric Quan Entered By: Alric Quan on 04/27/2016 15:26:37 [name] ([id number]) -------------------------------------------------------------------------------- Wound Assessment Details Patient Name: [name] Date of Service: 04/27/2016 3:00 PM Medical Record Patient Account Number: [id number] Number: Treating RN: Ahmed Prima 1919-02-07 (82 y.o. Other Clinician: Date of Birth/Sex: Female) Treating ROBSON, MICHAEL Primary Care Physician: Elizebeth Koller Physician/Extender: G Referring Physician: Laverle Hobby in Treatment: 3 Wound Status Wound Number: 1 Primary Venous Leg Ulcer Etiology: Wound Location: Left Malleolus Wound Status: Open Wounding Event: Gradually Appeared Comorbid Cataracts, Hypertension, Date Acquired: 04/07/2015 History: Osteoarthritis Weeks Of  Treatment: 3 Clustered Wound: No Photos Photo Uploaded By: Alric Quan on 04/27/2016 16:11:49 Wound Measurements Length: (cm) 0.7 % Reduction in Width: (cm) 0.7 % Reduction in Depth: (cm) 0.2 Epithelializati Area: (cm) 0.385 Tunneling: Volume: (cm) 0.077 Undermining: Area: 0% Volume: 0% on: None No No Wound Description Classification: Partial Thickness Wound Margin: Thickened Exudate Amount: Large Exudate Type: Serous Exudate Color: amber Foul Odor After Cleansing: No Wound Bed Granulation Amount: None Present (0%) Exposed Structure Necrotic Amount: Large (67-100%) Fascia Exposed: No Necrotic Quality: Adherent Slough Fat Layer Exposed: No Otterness, [place] ([id number]) Tendon Exposed: No Muscle Exposed: No Joint Exposed: No Bone Exposed: No Limited to Skin Breakdown Periwound Skin Texture Texture Color No Abnormalities Noted: No No Abnormalities Noted: No Localized Edema: Yes Erythema: Yes Erythema Location: Circumferential Moisture No Abnormalities Noted: No Temperature / Pain Temperature: No Abnormality Tenderness on Palpation: Yes Wound Preparation Ulcer Cleansing: Rinsed/Irrigated with Saline Topical Anesthetic Applied: Other: lidocaine 4%, Treatment Notes Wound #1 (Left Malleolus) 1. Cleansed with: Clean wound with Normal Saline 2. Anesthetic Topical Lidocaine 4% cream to wound bed prior to debridement 4. Dressing Applied: Prisma Ag 5. Secondary Dressing Applied Dry Gauze Foam Kerlix/Conform 7. Secured with Tape Notes netting Electronic Signature(s) Signed: 04/27/2016 5:02:09 PM By: Alric Quan Entered By: Alric Quan on 04/27/2016 15:21:35 [name] ([id number]) -------------------------------------------------------------------------------- Wound Assessment Details Patient Name: [name] Date of Service: 04/27/2016 3:00 PM Medical Record Patient Account Number: [id number] Number: Treating RN:  Ahmed Prima 1919/05/07 (82 y.o. Other Clinician: Date of Birth/Sex: Female) Treating ROBSON, [place] Primary Care Physician: Elizebeth Koller Physician/Extender: G Referring Physician: Laverle Hobby in Treatment: 3 Wound Status Wound Number: 2 Primary Arterial Insufficiency Ulcer Etiology: Wound Location: Left Toe Fifth - Lateral Wound Status: Open Wounding Event: Gradually Appeared Comorbid Cataracts, Hypertension, Date Acquired: 04/07/2015 History: Osteoarthritis Weeks Of Treatment: 3 Clustered Wound: No Photos Photo Uploaded By: Alric Quan on 04/27/2016 16:11:50 Wound Measurements Length: (cm) 0.5 Width: (cm) 0.5 Depth: (cm) 0.3 Area: (cm) 0.196 Volume: (cm) 0.059 % Reduction in Area: -317% % Reduction in Volume: -555.6% Epithelialization: None Wound Description Classification: Partial Thickness Foul Odor Afte Wound Margin: Thickened Exudate Amount: Large Exudate Type: Serous Exudate Color: amber r Cleansing: No Wound Bed Granulation Amount: None Present (0%) Exposed Structure Necrotic Amount:  Large (67-100%) Fascia Exposed: No Necrotic Quality: Adherent Slough Fat Layer Exposed: No Kuck, [place] ([id number]) Tendon Exposed: No Muscle Exposed: No Joint Exposed: No Bone Exposed: No Limited to Skin Breakdown Periwound Skin Texture Texture Color No Abnormalities Noted: No No Abnormalities Noted: No Localized Edema: Yes Erythema: Yes Erythema Location: Circumferential Moisture No Abnormalities Noted: No Temperature / Pain Temperature: No Abnormality Tenderness on Palpation: Yes Wound Preparation Ulcer Cleansing: Rinsed/Irrigated with Saline Topical Anesthetic Applied: Other: lidocaine 4%, Treatment Notes Wound #2 (Left, Lateral Toe Fifth) 1. Cleansed with: Clean wound with Normal Saline 2. Anesthetic Topical Lidocaine 4% cream to wound bed prior to debridement 4. Dressing Applied: Prisma Ag 5. Secondary Dressing  Applied Dry Gauze Foam Kerlix/Conform 7. Secured with Tape Notes netting Electronic Signature(s) Signed: 04/27/2016 5:02:09 PM By: Alric Quan Entered By: Alric Quan on 04/27/2016 15:22:54 [name] ([id number]) -------------------------------------------------------------------------------- Vitals Details Patient Name: [name] Date of Service: 04/27/2016 3:00 PM Medical Record Patient Account Number: [id number] Number: Treating RN: Ahmed Prima 12/13/1918 (82 y.o. Other Clinician: Date of Birth/Sex: Female) Treating ROBSON, [place] Primary Care Physician: Elizebeth Koller Physician/Extender: G Referring Physician: Laverle Hobby in Treatment: 3 Vital Signs Time Taken: 15:12 Temperature (F): 97.6 Height (in): 64 Pulse (bpm): 75 Weight (lbs): 158 Respiratory Rate (breaths/min): 16 Body Mass Index (BMI): 27.1 Blood Pressure (mmHg): 166/55 Reference Range: 80 - 120 mg / dl Electronic Signature(s) Signed: 04/27/2016 5:02:09 PM By: Alric Quan Entered By: Alric Quan on 04/27/2016 15:15:24

## 2016-04-29 ENCOUNTER — Other Ambulatory Visit: Payer: Self-pay | Admitting: Internal Medicine

## 2016-04-29 DIAGNOSIS — L97909 Non-pressure chronic ulcer of unspecified part of unspecified lower leg with unspecified severity: Secondary | ICD-10-CM

## 2016-05-04 ENCOUNTER — Encounter: Payer: Medicare PPO | Admitting: Internal Medicine

## 2016-05-04 DIAGNOSIS — I70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: Secondary | ICD-10-CM | POA: Diagnosis not present

## 2016-05-05 NOTE — Progress Notes (Signed)
JAMMI, DACRUZ ([id number]) Visit Report for 05/04/2016 Chief Complaint Document Details Patient Name: GENESES, JAIN Date of Service: 05/04/2016 3:00 PM Medical Record Patient Account Number: [id number] Number: Treating RN: Ahmed Prima May 29, 1919 (82 y.o. Other Clinician: Date of Birth/Sex: Female) Treating Annalyce Lanpher Primary Care Physician/Extender: Windell Moment Physician: Referring Physician: Laverle Hobby in Treatment: 4 Information Obtained from: Patient Chief Complaint 04/06/16; the patient is here for review of wounds on the left lateral ankle and left 5th toe that been present for a year Electronic Signature(s) Signed: 05/04/2016 3:57:44 PM By: [name] MD Entered By: [name] on 05/04/2016 15:51:29 [name] ([id number]) -------------------------------------------------------------------------------- HPI Details Patient Name: [name] Date of Service: 05/04/2016 3:00 PM Medical Record Patient Account Number: [id number] Number: Treating RN: Carolyne Fiscal, Debi December 28, 1918 (82 y.o. Other Clinician: Date of Birth/Sex: Female) Treating Varian Innes Primary Care Physician/Extender: Windell Moment Physician: Referring Physician: Laverle Hobby in Treatment: 4 History of Present Illness HPI Description: 04/06/16; this is a 82 year old woman who arrives accompanied by 2 daughters for a wound on her left ankle and her left fifth toe. These have apparently been present for a year. I'm not quite certain how she came to this clinic however she was being followed by Sharlotte Alamo her podiatrist for these wounds. She was also referred to [facility] and they apparently did a test presumably arterial studies although we don't have any of these results and we couldn't get through to the office today. The family but has been applying a combination of Bactroban and a light bandage and  perhaps more recently Silvadene cream. She did have an x-ray of the foot roughly 6 months ago at the podiatry office the family was unaware that if there were any abnormalities. Apparently they have not seen any healing here. Our intake nurse noted a slight skin tear on the right anterior lower leg. Nobody seemed aware of this. ABIs calculated in this clinic was 80 on the right and 0.4 on the left I have reviewed things [facility]. There is very little information on this patient. She apparently follows in current [facility] clinic which we don't have information from. She has mentioned already been to a [facility]. She has a history of hypothyroidism, nephrolithiasis arthritis and has had a previous mastectomy. 04/13/16; patient's x-ray was normal. She is already been to see Dr. Delana Meyer vascular surgery. Her arterial exam was from November 2016 this showed a left ABI of 0.62 her right of 0.76. Her duplex ultrasound of the left leg showed biphasic waves in the common femoral and distal femoral artery however monophasic waves in the superficial femoral artery proximal and mid biphasic it distal. 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 The patient tells me that she has pain at night when she tries to lie down which is improved by getting up and sitting in the chair this sounds like claudication at rest. Her wounds are on the left medial malleolus and the dorsal fifth toe small punched out wounds that are right on bone. We use Santyl last week 04/20/16: nurse informed me pt has declined evaluation for significant PAD. she denies systemic s/s of infections. 04/27/16;; the patient has had noninvasive arterial studies done in November 2016. ABI and the left was 0.62. Monophasic waves at the superficial femoral artery. Occluded to the posterior tibial artery. So had greater than 50% stenosis of the right superficial femoral and greater than 50% stenosis of the left superficial femoral artery. She had  bilateral tibial peroneal artery disease. Both of the wounds on the left fifth toe and left lateral malleolus have been present for more than a year. They have been to see vein and vascular. The patient has some pain but miraculously I think the wounds have largely been stable. No evidence of infection 05/04/16; she goes for a noninvasive study tomorrow and then sees Dr. Fletcher Anon on Monday. By the time she is here next week we should have a better picture of whether something can be done with regards to her arterial flow. We continue to have ischemic-looking wounds on the left fifth toe and left lateral malleolus HENSLEE, ALLTON ([id number]) Electronic Signature(s) Signed: 05/04/2016 3:57:44 PM By: [name] MD Entered By: [name] on 05/04/2016 15:53:11 [name] ([id number]) -------------------------------------------------------------------------------- Physical Exam Details Patient Name: [name] Date of Service: 05/04/2016 3:00 PM Medical Record Patient Account Number: [id number] Number: Treating RN: Ahmed Prima 05/06/1919 (82 y.o. Other Clinician: Date of Birth/Sex: Female) Treating Luba Matzen Primary Care Physician/Extender: Windell Moment Physician: Referring Physician: Laverle Hobby in Treatment: 4 Constitutional Patient is hypertensive.. Pulse regular and within target range for patient.Marland Kitchen Respirations regular, non-labored and within target range.. Temperature is normal and within the target range for the patient.. Patient's appearance is neat and clean. Appears in no acute distress. Well nourished and well developed.. Eyes Conjunctivae clear. No discharge.Marland Kitchen Respiratory Respiratory effort is easy and symmetric bilaterally. Rate is normal at rest and on room air.Marland Kitchen Lymphatic Nonpalpable and the popliteal or inguinal area. Integumentary (Hair, Skin) No other issues except for the 2 small wounds described below on the  left fifth toe and left lateral malleolus. Psychiatric No evidence of depression, anxiety, or agitation. Calm, cooperative, and communicative. Appropriate interactions and affect.. Notes Wound exam; not much change in these punched out painful ulcers. One on the dorsal aspect of her left fifth toe and the other on the left lateral malleolus. The left lateral malleolus wound goes right the bone. There is no evidence of surrounding infection at this point Electronic Signature(s) Signed: 05/04/2016 3:57:44 PM By: [name] MD Entered By: [name] on 05/04/2016 15:54:47 [name] ([id number]) -------------------------------------------------------------------------------- Physician Orders Details Patient Name: [name] Date of Service: 05/04/2016 3:00 PM Medical Record Patient Account Number: [id number] Number: Treating RN: Ahmed Prima [birth year] (82 y.o. Other Clinician: Date of Birth/Sex: Female) Treating Neylan Koroma Primary Care Physician/Extender: Windell Moment Physician: Referring Physician: Laverle Hobby in Treatment: 4 Verbal / Phone Orders: Yes Clinician: Pinkerton, Debi Read Back and Verified: Yes Diagnosis Coding Wound Cleansing Wound #1 Left Malleolus o Clean wound with Normal Saline. - for clinic use o Cleanse wound with mild soap and water o May Shower, gently pat wound dry prior to applying new dressing. Wound #2 Left,Lateral Toe Fifth o Clean wound with Normal Saline. - for clinic use o Cleanse wound with mild soap and water o May Shower, gently pat wound dry prior to applying new dressing. Anesthetic Wound #1 Left Malleolus o Topical Lidocaine 4% cream applied to wound bed prior to debridement - for clinic use Wound #2 Left,Lateral Toe Fifth o Topical Lidocaine 4% cream applied to wound bed prior to debridement - for clinic use Skin Barriers/Peri-Wound Care Wound #1 Left Malleolus o  Barrier cream Wound #2 Left,Lateral Toe Fifth o Barrier cream Primary Wound Dressing Wound #1 Left Malleolus o Prisma Ag - moisten with saline Wound #2 Left,Lateral Toe Fifth o Prisma Ag - moisten with saline Secondary Dressing Idris, Kristena (  [id number]) Wound #1 Left Malleolus o Gauze and Kerlix/Conform - netting, tape o Foam Wound #2 Left,Lateral Toe Fifth o Gauze and Kerlix/Conform o Foam Dressing Change Frequency Wound #1 Left Malleolus o Change dressing every other day. Wound #2 Left,Lateral Toe Fifth o Change dressing every other day. Follow-up Appointments Wound #1 Left Malleolus o Return Appointment in 1 week. Wound #2 Left,Lateral Toe Fifth o Return Appointment in 1 week. Edema Control Wound #1 Left Malleolus o Elevate legs to the level of the heart and pump ankles as often as possible o Other: - compression stockings Wound #2 Left,Lateral Toe Fifth o Elevate legs to the level of the heart and pump ankles as often as possible o Other: - compression stockings Additional Orders / Instructions Wound #1 Left Malleolus o Increase protein intake. Wound #2 Left,Lateral Toe Fifth o Increase protein intake. Medications-please add to medication list. Wound #1 Left Malleolus o Other: - Vitamin C, Zinc, Multivitamins Wound #2 Left,Lateral Toe Fifth o Other: - Vitamin C, Zinc, Multivitamins Electronic Signature(s) GERALD, SOCORRO ([id number]) Signed: 05/04/2016 3:57:44 PM By: [name] MD Signed: 05/04/2016 4:10:48 PM By: Regan Lemming BSN, RN Entered By: Regan Lemming on 05/04/2016 15:49:50 [name] ([id number]) -------------------------------------------------------------------------------- Problem List Details Patient Name: [name] Date of Service: 05/04/2016 3:00 PM Medical Record Patient Account Number: [id number] Number: Treating RN: Ahmed Prima May 11, 1919 (82 y.o. 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 Other Clinician: Date of Birth/Sex: Female) Treating Darianna Amy Primary Care Physician/Extender: Windell Moment Physician: Referring Physician: Laverle Hobby in Treatment: 4 Subjective Chief Complaint Information obtained from Patient 04/06/16; the patient is here for review of wounds on the left lateral ankle and left 5th toe that been present for a year History of Present Illness (HPI) 04/06/16; this is a 82 year old woman who arrives accompanied by 2 daughters for a wound on her left ankle and her left fifth toe. These have apparently been present for a year. I'm not quite certain how she came to this clinic however she was being followed by Sharlotte Alamo her podiatrist for these wounds. She was also referred to [facility] and they apparently  did a test presumably arterial studies although we don't have any of these results and we couldn't get through to the office today. The family but has been applying a combination of Bactroban and a light bandage and perhaps more recently Silvadene cream. She did have an x-ray of the foot roughly 6 months ago at the podiatry office the family was unaware that if there were any abnormalities. Apparently they have not seen any healing here. Our intake nurse noted a slight skin tear on the right anterior  lower leg. Nobody seemed aware of this. ABIs calculated in this clinic was 80 on the right and 0.4 on the left I have reviewed things [facility]. There is very little information on this patient. She apparently follows in current total clinic which we don't have information from. She has mentioned already been to a [facility]. She has a history of hypothyroidism, nephrolithiasis arthritis and has had a previous mastectomy. 04/13/16; patient's x-ray was normal. She is already been to see Dr. Delana Meyer vascular surgery. Her arterial exam was from November 2016 this showed a left ABI of 0.62 her right of 0.76. Her duplex ultrasound of the left leg showed biphasic waves in the common femoral and distal femoral artery however monophasic waves in the superficial femoral artery proximal and mid biphasic it distal. 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 The patient tells me that she has pain at night when she tries to lie down which is improved by getting up and sitting in the chair this sounds like claudication at rest. Her wounds are on the left medial malleolus and the dorsal fifth toe small punched out wounds that are right on bone. We use Santyl last week 04/20/16: nurse informed me pt has declined evaluation for significant PAD. she denies systemic s/s of infections. 04/27/16;; the patient has had noninvasive arterial studies done in November 2016. ABI and the left was 0.62. Monophasic waves at the  superficial femoral artery. Occluded to the posterior tibial artery. So had greater than 50% stenosis of the right superficial femoral and greater than 50% stenosis of the left superficial femoral artery. She had bilateral tibial peroneal artery disease. Both of the wounds on the left Schlotterbeck, Josselyne ([id number]) fifth toe and left lateral malleolus have been present for more than a year. They have been to see vein and vascular. The patient has some pain but miraculously I think the wounds have largely been stable. No evidence of infection 05/04/16; she goes for a noninvasive study tomorrow and then sees Dr. Fletcher Anon on Monday. By the time she is here next week we should have a better picture of whether something can be done with regards to her arterial flow. We continue to have ischemic-looking wounds on the left fifth toe and left lateral malleolus Objective Constitutional Patient is hypertensive.. Pulse regular and within target range for patient.Marland Kitchen Respirations regular, non-labored and within target range.. Temperature is normal and within the target range for the patient.. Patient's appearance is neat and clean. Appears in no acute distress. Well nourished and well developed.. Vitals Time Taken: 3:35 PM, Height: 64 in, Weight: 158 lbs, BMI: 27.1, Temperature: 98.2 F, Pulse: 86 bpm, Respiratory Rate: 17 breaths/min, Blood Pressure: 167/50 mmHg. Eyes Conjunctivae clear. No discharge.Marland Kitchen Respiratory Respiratory effort is easy and symmetric bilaterally. Rate is normal at rest and on room air.Marland Kitchen Lymphatic Nonpalpable and the popliteal or inguinal area. Psychiatric No evidence of depression, anxiety, or agitation. Calm, cooperative, and communicative. Appropriate interactions and affect.. General Notes: Wound exam; not much change in these punched out painful ulcers. One on the dorsal aspect of her left fifth toe and the other on the left lateral malleolus. The left lateral malleolus wound  goes right the bone. There is no evidence of surrounding infection at this point Integumentary (Hair, Skin) No other issues except for the 2 small wounds described below on the left fifth toe and left lateral malleolus. Wound #1 status is Open. Original cause of wound was Gradually Appeared. The wound is located on the  Left Malleolus. The wound measures 0.8cm length x 0.8cm width x 0.3cm depth; 0.503cm^2 area and 0.151cm^3 volume. The wound is limited to skin breakdown. There is no tunneling or undermining noted. There is a large amount of serous drainage noted. The wound margin is thickened. There is no granulation within the wound bed. There is a large (67-100%) amount of necrotic tissue within the wound bed including Adherent Slough. The periwound skin appearance exhibited: Localized Edema, Erythema. The surrounding Gavel, Mylinda ([id number]) wound skin color is noted with erythema which is circumferential. Periwound temperature was noted as No Abnormality. The periwound has tenderness on palpation. Wound #2 status is Open. Original cause of wound was Gradually Appeared. The wound is located on the Left,Lateral Toe Fifth. The wound measures 0.5cm length x 0.5cm width x 0.3cm depth; 0.196cm^2 area and 0.059cm^3 volume. The wound is limited to skin breakdown. There is no tunneling or undermining noted. There is a large amount of serous drainage noted. The wound margin is thickened. There is no granulation within the wound bed. There is a large (67-100%) amount of necrotic tissue within the wound bed including Adherent Slough. The periwound skin appearance exhibited: Localized Edema, Erythema. The surrounding wound skin color is noted with erythema which is circumferential. Periwound temperature was noted as No Abnormality. The periwound has tenderness on palpation. 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 - for clinic use Cleanse wound with mild soap and water May Shower, gently pat wound dry prior to applying new dressing. Wound #2 Left,Lateral Toe Fifth: Clean wound with Normal Saline. - for clinic use Cleanse wound with mild soap and water May Shower, gently pat wound dry prior to applying new dressing. Anesthetic: Wound #1 Left Malleolus: Topical Lidocaine 4% cream applied to wound bed prior to debridement - for clinic use Wound #2 Left,Lateral Toe Fifth: Topical Lidocaine 4% cream applied to wound bed prior to debridement - for clinic use Skin Barriers/Peri-Wound Care: Wound #1 Left Malleolus: Barrier cream Wound #2 Left,Lateral Toe FifthJames Burrow, Estill Bamberg ([id number]) Barrier cream Primary Wound Dressing: Wound #1 Left Malleolus: Prisma Ag - moisten with saline Wound #2 Left,Lateral Toe Fifth: Prisma Ag - moisten with saline Secondary Dressing: Wound #1 Left Malleolus: Gauze and Kerlix/Conform - netting, tape Foam Wound #2 Left,Lateral Toe Fifth: Gauze and Kerlix/Conform Foam Dressing Change Frequency: Wound #1 Left Malleolus: Change dressing every other day. Wound #2 Left,Lateral Toe Fifth: Change dressing every other day. Follow-up Appointments: Wound #1 Left Malleolus: Return Appointment in 1 week. Wound #2 Left,Lateral Toe Fifth: Return Appointment in 1 week. Edema Control: Wound #1 Left Malleolus: Elevate legs to the level of the heart and pump ankles as often as possible Other: - compression stockings Wound #2 Left,Lateral Toe Fifth: Elevate legs to the level of the heart and pump ankles as often as possible Other: -  compression stockings Additional Orders / Instructions: Wound #1 Left Malleolus: Increase protein intake. Wound #2 Left,Lateral Toe Fifth: Increase protein intake. Medications-please add to medication list.: Wound #1 Left Malleolus: Other: - Vitamin C, Zinc, Multivitamins Wound #2 Left,Lateral Toe Fifth: Other: - Vitamin C, Zinc, Multivitamins Continue  prisma,kerlix,conform Second opinion from Dr. Ulice Brilliant, [facility] ([id number]) Electronic Signature(s) Signed: 05/04/2016 3:57:44 PM By: [name] MD Entered By: [name] on 05/04/2016 15:55:47 [name] ([id number]) -------------------------------------------------------------------------------- [place] Details Patient Name: [name] Date of Service: 05/04/2016 Medical Record Patient Account Number: [id number] Number: Treating RN: Ahmed Prima Mar 10, 1919 (82 y.o. 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

## 2016-05-06 ENCOUNTER — Ambulatory Visit: Payer: Medicare PPO

## 2016-05-06 DIAGNOSIS — L97909 Non-pressure chronic ulcer of unspecified part of unspecified lower leg with unspecified severity: Secondary | ICD-10-CM

## 2016-05-06 NOTE — Progress Notes (Signed)
HERA, DEXHEIMER ([id number]) Visit Report for 05/04/2016 Arrival Information Details Patient Name: [name], [name] Date of Service: 05/04/2016 3:00 PM Medical Record Patient Account Number: [id number] Number: Treating RN: Ahmed Prima November 14, 1918 (82 y.o. Other Clinician: Date of Birth/Sex: Female) Treating ROBSON, MICHAEL Primary Care Physician: Elizebeth Koller Physician/Extender: G Referring Physician: Laverle Hobby in Treatment: 4 Visit Information History Since Last Visit All ordered tests and consults were completed: No Patient Arrived: Ambulatory Added or deleted any medications: No Arrival Time: 15:26 Any new allergies or adverse reactions: No Accompanied By: Arbie Cookey, daughter Had a fall or experienced change in Yes activities of daily living that may affect Transfer Assistance: None risk of falls: Patient Requires Transmission- No Signs or symptoms of abuse/neglect since last No Based Precautions: visito Patient Has Alerts: No Hospitalized since last visit: No Has Dressing in Place as Prescribed: Yes Pain Present Now: No Electronic Signature(s) Signed: 05/05/2016 3:36:48 PM By: Lorine Bears RCP, RRT, CHT Entered By: Lorine Bears on 05/04/2016 15:33:55 [name] ([id number]) -------------------------------------------------------------------------------- Clinic Level of Care Assessment Details Patient Name: [name] Date of Service: 05/04/2016 3:00 PM Medical Record Patient Account Number: [id number] Number: Treating RN: Ahmed Prima 1919/09/17 (82 y.o. Other Clinician: Date of Birth/Sex: Female) Treating ROBSON, [place] Primary Care Physician: Elizebeth Koller Physician/Extender: G Referring Physician: Laverle Hobby in Treatment: 4 Clinic Level of Care Assessment Items TOOL 4 Quantity Score X - Use when only an EandM is performed on FOLLOW-UP visit 1 0 ASSESSMENTS -  Nursing Assessment / Reassessment X - Reassessment of Co-morbidities (includes updates in patient status) 1 10 X - Reassessment of Adherence to Treatment Plan 1 5 ASSESSMENTS - Wound and Skin Assessment / Reassessment []  - Simple Wound Assessment / Reassessment - one wound 0 X - Complex Wound Assessment / Reassessment - multiple wounds 2 5 []  - Dermatologic / Skin Assessment (not related to wound area) 0 ASSESSMENTS - Focused Assessment []  - Circumferential Edema Measurements - multi extremities 0 []  - Nutritional Assessment / Counseling / Intervention 0 []  - Lower Extremity Assessment (monofilament, tuning fork, pulses) 0 []  - Peripheral Arterial Disease Assessment (using hand held doppler) 0 ASSESSMENTS - Ostomy and/or Continence Assessment and Care []  - Incontinence Assessment and Management 0 []  - Ostomy Care Assessment and Management (repouching, etc.) 0 PROCESS - Coordination of Care X - Simple Patient / Family Education for ongoing care 1 15 []  - Complex (extensive) Patient / Family Education for ongoing care 0 []  - Staff obtains Programmer, systems, Records, Test Results / Process Orders 0 []  - Staff telephones HHA, Nursing Homes / Clarify orders / etc 0 Krutz, Loreta ([id number]) []  - Routine Transfer to another Facility (non-emergent condition) 0 []  - Routine Hospital Admission (non-emergent condition) 0 []  - New Admissions / Biomedical engineer / Ordering NPWT, Apligraf, etc. 0 []  - Emergency Hospital Admission (emergent condition) 0 X - Simple Discharge Coordination 1 10 []  - Complex (extensive) Discharge Coordination 0 PROCESS - Special Needs []  - Pediatric / Minor Patient Management 0 []  - Isolation Patient Management 0 []  - Hearing / Language / Visual special needs 0 []  - Assessment of Community assistance (transportation, D/C planning, etc.) 0 []  - Additional assistance / Altered mentation 0 []  - Support Surface(s) Assessment (bed, cushion, seat, etc.) 0 INTERVENTIONS -  Wound Cleansing / Measurement []  - Simple Wound Cleansing - one wound 0 X - Complex Wound Cleansing - multiple wounds 2 5 X - Wound Imaging (photographs - any number of wounds) 1  5 []  - Wound Tracing (instead of photographs) 0 []  - Simple Wound Measurement - one wound 0 X - Complex Wound Measurement - multiple wounds 2 5 INTERVENTIONS - Wound Dressings []  - Small Wound Dressing one or multiple wounds 0 X - Medium Wound Dressing one or multiple wounds 1 15 []  - Large Wound Dressing one or multiple wounds 0 X - Application of Medications - topical 1 5 []  - Application of Medications - injection 0 Hanzlik, Lorrine ([id number]) INTERVENTIONS - Miscellaneous []  - External ear exam 0 []  - Specimen Collection (cultures, biopsies, blood, body fluids, etc.) 0 []  - Specimen(s) / Culture(s) sent or taken to Lab for analysis 0 []  - Patient Transfer (multiple staff / Harrel Lemon Lift / Similar devices) 0 []  - Simple Staple / Suture removal (25 or less) 0 []  - Complex Staple / Suture removal (26 or more) 0 []  - Hypo / Hyperglycemic Management (close monitor of Blood Glucose) 0 []  - Ankle / Brachial Index (ABI) - do not check if billed separately 0 X - Vital Signs 1 5 Has the patient been seen at the hospital within the last three years: Yes Total Score: 100 Level Of Care: New/Established - Level 3 Electronic Signature(s) Signed: 05/04/2016 4:36:40 PM By: Alric Quan Entered By: Alric Quan on 05/04/2016 16:22:04 [name] ([id number]) -------------------------------------------------------------------------------- Encounter Discharge Information Details Patient Name: [name] Date of Service: 05/04/2016 3:00 PM Medical Record Patient Account Number: [id number] Number: Treating RN: Ahmed Prima 1919/09/01 (82 y.o. Other Clinician: Date of Birth/Sex: Female) Treating ROBSON, MICHAEL Primary Care Physician: Elizebeth Koller Physician/Extender: G Referring  Physician: Laverle Hobby in Treatment: 4 Encounter Discharge Information Items Discharge Pain Level: 0 Discharge Condition: Stable Ambulatory Status: Ambulatory Discharge Destination: Home Transportation: Private Auto Accompanied By: daughter Schedule Follow-up Appointment: Yes Medication Reconciliation completed and provided to Patient/Care Yes Khiley Lieser: Provided on Clinical Summary of Care: 05/04/2016 Form Type Recipient Paper Patient [facility] Electronic Signature(s) Signed: 05/04/2016 4:02:33 PM By: Ruthine Dose Entered By: Ruthine Dose on 05/04/2016 16:02:33 [name] ([id number]) -------------------------------------------------------------------------------- Lower Extremity Assessment Details Patient Name: [name] Date of Service: 05/04/2016 3:00 PM Medical Record Patient Account Number: [id number] Number: Treating RN: Baruch Gouty, RN, BSN, Rita November 25, 1918 (82 y.o. Other Clinician: Date of Birth/Sex: Female) Treating ROBSON, MICHAEL Primary Care Physician: Elizebeth Koller Physician/Extender: G Referring Physician: Laverle Hobby in Treatment: 4 Vascular Assessment Pulses: Posterior Tibial Dorsalis Pedis Palpable: [Left:No] Doppler: [Left:Multiphasic] Extremity colors, hair growth, and conditions: Extremity Color: [Left:Mottled] Temperature of Extremity: [Left:Warm] Capillary Refill: [Left:> 3 seconds] Toe Nail Assessment Left: Right: Thick: No Discolored: No Deformed: No Improper Length and Hygiene: No Electronic Signature(s) Signed: 05/04/2016 4:10:48 PM By: Regan Lemming BSN, RN Entered By: Regan Lemming on 05/04/2016 15:49:06 [name] ([id number]) -------------------------------------------------------------------------------- Multi Wound Chart Details Patient Name: [name] Date of Service: 05/04/2016 3:00 PM Medical Record Patient Account Number: [id number] Number: Treating RN: Baruch Gouty, RN, BSN,  Rita Sep 10, 1919 (82 y.o. Other Clinician: Date of Birth/Sex: Female) Treating ROBSON, [place] Primary Care Physician: Elizebeth Koller Physician/Extender: G Referring Physician: Laverle Hobby in Treatment: 4 Vital Signs Height(in): 64 Pulse(bpm): 86 Weight(lbs): 158 Blood Pressure 167/50 (mmHg): Body Mass Index(BMI): 27 Temperature(F): 98.2 Respiratory Rate 17 (breaths/min): Photos: [1:No Photos] [2:No Photos] [N/A:N/A] Wound Location: [1:Left Malleolus] [2:Left Toe Fifth - Lateral] [N/A:N/A] Wounding Event: [1:Gradually Appeared] [2:Gradually Appeared] [N/A:N/A] Primary Etiology: [1:Venous Leg Ulcer] [2:Arterial Insufficiency Ulcer N/A] Comorbid History: [1:Cataracts, Hypertension, Osteoarthritis] [2:Cataracts, Hypertension, N/A Osteoarthritis] Date Acquired: [1:04/07/2015] [2:04/07/2015] [N/A:N/A] Weeks of Treatment: [  1:4] [2:4] [N/A:N/A] Wound Status: [1:Open] [2:Open] [N/A:N/A] Measurements L x W x D 0.8x0.8x0.3 [2:0.5x0.5x0.3] [N/A:N/A] (cm) Area (cm) : [1:0.503] [2:0.196] [N/A:N/A] Volume (cm) : [1:0.151] [2:0.059] [N/A:N/A] % Reduction in Area: [1:-30.60%] [2:-317.00%] [N/A:N/A] % Reduction in Volume: -96.10% [2:-555.60%] [N/A:N/A] Classification: [1:Partial Thickness] [2:Partial Thickness] [N/A:N/A] Exudate Amount: [1:Large] [2:Large] [N/A:N/A] Exudate Type: [1:Serous] [2:Serous] [N/A:N/A] Exudate Color: [1:amber] [2:amber] [N/A:N/A] Wound Margin: [1:Thickened] [2:Thickened] [N/A:N/A] Granulation Amount: [1:None Present (0%)] [2:None Present (0%)] [N/A:N/A] Necrotic Amount: [1:Large (67-100%)] [2:Large (67-100%)] [N/A:N/A] Exposed Structures: [1:Fascia: No Fat: No Tendon: No Muscle: No Joint: No Bone: No] [2:Fascia: No Fat: No Tendon: No Muscle: No Joint: No Bone: No] [N/A:N/A] Limited to Skin Limited to Skin Breakdown Breakdown Epithelialization: None None N/A Periwound Skin Texture: Edema: Yes Edema: Yes N/A Periwound Skin No Abnormalities Noted  No Abnormalities Noted N/A Moisture: Periwound Skin Color: Erythema: Yes Erythema: Yes N/A Erythema Location: Circumferential Circumferential N/A Temperature: No Abnormality No Abnormality N/A Tenderness on Yes Yes N/A Palpation: Wound Preparation: Ulcer Cleansing: Ulcer Cleansing: N/A Rinsed/Irrigated with Rinsed/Irrigated with Saline Saline Topical Anesthetic Topical Anesthetic Applied: Other: lidocaine Applied: Other: lidocaine 4% 4% Treatment Notes Electronic Signature(s) Signed: 05/04/2016 4:10:48 PM By: Regan Lemming BSN, RN Entered By: Regan Lemming on 05/04/2016 15:41:14 [name] ([id number]) -------------------------------------------------------------------------------- [place] Details Patient Name: [name] Date of Service: 05/04/2016 3:00 PM Medical Record Patient Account Number: [id number] Number: Treating RN: Baruch Gouty, RN, BSN, Rita 02-21-1919 (82 y.o. Other Clinician: Date of Birth/Sex: Female) Treating ROBSON, [place] Primary Care Physician: Elizebeth Koller Physician/Extender: G Referring Physician: Laverle Hobby in Treatment: 4 Active Inactive Abuse / Safety / Falls / Self Care Management Nursing Diagnoses: Potential for falls Goals: Patient will remain injury free Date Initiated: 04/06/2016 Goal Status: Active Interventions: Assess fall risk on admission and as needed Notes: Nutrition Nursing Diagnoses: Imbalanced nutrition Goals: Patient/caregiver agrees to and verbalizes understanding of need to use nutritional supplements and/or vitamins as prescribed Date Initiated: 04/06/2016 Goal Status: Active Interventions: Assess patient nutrition upon admission and as needed per policy Notes: Orientation to the Wound Care Program Nursing Diagnoses: Knowledge deficit related to the wound healing center program AUZHANE, SLUTSKY ([id number]) Goals: Patient/caregiver will verbalize understanding of the  [place] Date Initiated: 04/06/2016 Goal Status: Active Interventions: Provide education on orientation to the wound center Notes: Pain, Acute or Chronic Nursing Diagnoses: Pain, acute or chronic: actual or potential Potential alteration in comfort, pain Goals: Patient will verbalize adequate pain control and receive pain control interventions during procedures as needed Date Initiated: 04/06/2016 Goal Status: Active Interventions: Assess comfort goal upon admission Complete pain assessment as per visit requirements Notes: Wound/Skin Impairment Nursing Diagnoses: Impaired tissue integrity Goals: Ulcer/skin breakdown will have a volume reduction of 30% by week 4 Date Initiated: 04/06/2016 Goal Status: Active Ulcer/skin breakdown will have a volume reduction of 50% by week 8 Date Initiated: 04/06/2016 Goal Status: Active Ulcer/skin breakdown will have a volume reduction of 80% by week 12 Date Initiated: 04/06/2016 Goal Status: Active Interventions: Assess patient/caregiver ability to obtain necessary supplies BROWNIE, DUTKO ([id number]) Assess ulceration(s) every visit Notes: Electronic Signature(s) Signed: 05/04/2016 4:10:48 PM By: Regan Lemming BSN, RN Entered By: Regan Lemming on 05/04/2016 15:41:01 [name] ([id number]) -------------------------------------------------------------------------------- Pain Assessment Details Patient Name: [name] Date of Service: 05/04/2016 3:00 PM Medical Record Patient Account Number: [id number] Number: Treating RN: Baruch Gouty, RN, BSN, Rita 1918-11-24 (82 y.o. Other Clinician: Date of Birth/Sex: Female) Treating ROBSON, [place] Primary Care Physician: Elizebeth Koller Physician/Extender: G Referring  Physician: Laverle Hobby in Treatment: 4 Active Problems Location of Pain Severity and Description of Pain Patient Has Paino No Site Locations With Dressing Change: No Pain Management  and Medication Current Pain Management: Electronic Signature(s) Signed: 05/04/2016 4:10:48 PM By: Regan Lemming BSN, RN Entered By: Regan Lemming on 05/04/2016 15:34:54 [name] ([id number]) -------------------------------------------------------------------------------- Patient/Caregiver Education Details Patient Name: [name] Date of Service: 05/04/2016 3:00 PM Medical Record Patient Account Number: [id number] Number: Treating RN: Baruch Gouty, RN, BSN, Rita 04-18-1919 (82 y.o. Other Clinician: Date of Birth/Gender: Female) Treating ROBSON, [place] Primary Care Physician: Elizebeth Koller Physician/Extender: G Referring Physician: Laverle Hobby in Treatment: 4 Education Assessment Education Provided To: Patient Education Topics Provided Wound/Skin Impairment: Handouts: Other: change dressing as ordered Methods: Demonstration, Explain/Verbal Responses: State content correctly Electronic Signature(s) Signed: 05/04/2016 4:10:48 PM By: Regan Lemming BSN, RN Entered By: Regan Lemming on 05/04/2016 15:51:10 [name] ([id number]) -------------------------------------------------------------------------------- Wound Assessment Details Patient Name: [name] Date of Service: 05/04/2016 3:00 PM Medical Record Patient Account Number: [id number] Number: Treating RN: Baruch Gouty, RN, BSN, Rita Nov 21, 1918 (82 y.o. Other Clinician: Date of Birth/Sex: Female) Treating ROBSON, MICHAEL Primary Care Physician: Elizebeth Koller Physician/Extender: G Referring Physician: Laverle Hobby in Treatment: 4 Wound Status Wound Number: 1 Primary Venous Leg Ulcer Etiology: Wound Location: Left Malleolus Wound Status: Open Wounding Event: Gradually Appeared Comorbid Cataracts, Hypertension, Date Acquired: 04/07/2015 History: Osteoarthritis Weeks Of Treatment: 4 Clustered Wound: No Photos Photo Uploaded By: Regan Lemming on 05/04/2016  16:05:26 Wound Measurements Length: (cm) 0.8 Width: (cm) 0.8 Depth: (cm) 0.3 Area: (cm) 0.503 Volume: (cm) 0.151 % Reduction in Area: -30.6% % Reduction in Volume: -96.1% Epithelialization: None Tunneling: No Undermining: No Wound Description Classification: Partial Thickness Wound Margin: Thickened Exudate Amount: Large Kronk, [place] ([id number]) Foul Odor After Cleansing: No Exudate Type: Serous Exudate Color: amber Wound Bed Granulation Amount: None Present (0%) Exposed Structure Necrotic Amount: Large (67-100%) Fascia Exposed: No Necrotic Quality: Adherent Slough Fat Layer Exposed: No Tendon Exposed: No Muscle Exposed: No Joint Exposed: No Bone Exposed: No Limited to Skin Breakdown Periwound Skin Texture Texture Color No Abnormalities Noted: No No Abnormalities Noted: No Localized Edema: Yes Erythema: Yes Erythema Location: Circumferential Moisture No Abnormalities Noted: No Temperature / Pain Temperature: No Abnormality Tenderness on Palpation: Yes Wound Preparation Ulcer Cleansing: Rinsed/Irrigated with Saline Topical Anesthetic Applied: Other: lidocaine 4%, Treatment Notes Wound #1 (Left Malleolus) 1. Cleansed with: Clean wound with Normal Saline 4. Dressing Applied: Prisma Ag 5. Secondary Dressing Applied Dry Gauze Foam Kerlix/Conform 7. Secured with Tape Notes netting Electronic Signature(s) Signed: 05/04/2016 4:10:48 PM By: Regan Lemming BSN, RN Entered By: Regan Lemming on 05/04/2016 15:40:42 [name] ([id number]) -------------------------------------------------------------------------------- Wound Assessment Details Patient Name: [name] Date of Service: 05/04/2016 3:00 PM Medical Record Patient Account Number: [id number] Number: Treating RN: Baruch Gouty, RN, BSN, Rita 1919-08-24 (82 y.o. Other Clinician: Date of Birth/Sex: Female) Treating ROBSON, [place] Primary Care Physician: Elizebeth Koller Physician/Extender: G Referring Physician: Laverle Hobby in Treatment: 4 Wound Status Wound Number: 2 Primary Arterial Insufficiency Ulcer Etiology: Wound Location: Left Toe Fifth - Lateral Wound Status: Open Wounding Event: Gradually Appeared Comorbid Cataracts, Hypertension, Date Acquired: 04/07/2015 History: Osteoarthritis Weeks Of Treatment: 4 Clustered Wound: No Photos Photo Uploaded By: Regan Lemming on 05/04/2016 16:05:26 Wound Measurements Length: (cm) 0.5 Width: (cm) 0.5 Depth: (cm) 0.3 Area: (cm) 0.196 Volume: (cm) 0.059 % Reduction in Area: -317% % Reduction in Volume: -555.6% Epithelialization: None Tunneling: No Undermining: No Wound Description Classification: Partial Thickness  Foul Odor Aft Wound Margin: Thickened Exudate Amount: Large Exudate Type: Serous Exudate Color: amber er Cleansing: No Wound Bed Granulation Amount: None Present (0%) Exposed Structure Necrotic Amount: Large (67-100%) Fascia Exposed: No Necrotic Quality: Adherent Slough Fat Layer Exposed: No B[place], [place] ([id number]) Tendon Exposed: No Muscle Exposed: No Joint Exposed: No Bone Exposed: No Limited to Skin Breakdown Periwound Skin Texture Texture Color No Abnormalities Noted: No No Abnormalities Noted: No Localized Edema: Yes Erythema: Yes Erythema Location: Circumferential Moisture No Abnormalities Noted: No Temperature / Pain Temperature: No Abnormality Tenderness on Palpation: Yes Wound Preparation Ulcer Cleansing: Rinsed/Irrigated with Saline Topical Anesthetic Applied: Other: lidocaine 4%, Treatment Notes Wound #2 (Left, Lateral Toe Fifth) 1. Cleansed with: Clean wound with Normal Saline 4. Dressing Applied: Prisma Ag 5. Secondary Dressing Applied Dry Gauze Foam Kerlix/Conform 7. Secured with Tape Notes netting Electronic Signature(s) Signed: 05/04/2016 4:10:48 PM By: Regan Lemming BSN, RN Entered By: Regan Lemming on 05/04/2016  15:40:56 [name] ([id number]) -------------------------------------------------------------------------------- Vitals Details Patient Name: [name] Date of Service: 05/04/2016 3:00 PM Medical Record Patient Account Number: [id number] Number: Treating RN: Baruch Gouty, RN, BSN, Rita [birth year] (82 y.o. Other Clinician: Date of Birth/Sex: Female) Treating ROBSON, [place] Primary Care Physician: Elizebeth Koller Physician/Extender: G Referring Physician: Laverle Hobby in Treatment: 4 Vital Signs Time Taken: 15:35 Temperature (F): 98.2 Height (in): 64 Pulse (bpm): 86 Weight (lbs): 158 Respiratory Rate (breaths/min): 17 Body Mass Index (BMI): 27.1 Blood Pressure (mmHg): 167/50 Reference Range: 80 - 120 mg / dl Electronic Signature(s) Signed: 05/04/2016 4:10:48 PM By: Regan Lemming BSN, RN Entered By: Regan Lemming on 05/04/2016 15:36:04

## 2016-05-09 ENCOUNTER — Ambulatory Visit (INDEPENDENT_AMBULATORY_CARE_PROVIDER_SITE_OTHER): Payer: Medicare PPO | Admitting: Cardiovascular Disease

## 2016-05-09 ENCOUNTER — Encounter: Payer: Self-pay | Admitting: Cardiovascular Disease

## 2016-05-09 VITALS — BP 185/60 | HR 90 | Ht 64.0 in

## 2016-05-09 DIAGNOSIS — I10 Essential (primary) hypertension: Secondary | ICD-10-CM

## 2016-05-09 DIAGNOSIS — I739 Peripheral vascular disease, unspecified: Secondary | ICD-10-CM

## 2016-05-09 NOTE — Progress Notes (Signed)
Cardiology Office Note   Date:  05/09/2016   ID:  [name], DOB 09/30/1919, MRN [id number]  PCP:  Lorelee Market, MD  Cardiologist:   [name], MD   Chief Complaint  Patient presents with  . New Patient (Initial Visit)    SOME LEG SWELLING. NO CP OR SOB.      History of Present Illness: [name] is a 82 y.o. female who Was referred by Dr. Dellia Nims for evaluation of peripheral arterial disease and nonhealing ulcer on the left foot. The patient has no previous cardiac history and no history of diabetes or tobacco use. She is known to have hypertension, hyperlipidemia and hypothyroidism. She developed 2 ulcers on the left foot about 10 months ago and these has not healed in spite of seeing multiple providers. She started going to the wound clinic about one month ago and there has been no significant improvement. The wounds are relatively small and superficial. She underwent noninvasive vascular evaluation in our office which showed an ABI of 0.55 on the right and 0.40 on the left. Duplex showed relatively long occlusion of the mid to distal left SFA with two-vessel runoff below the knee. She has significant pain in the left foot especially at night and usually she has to dangle her foot down in order to be able to sleep. This seems to be the biggest issue for her. Otherwise in spite of her age, she is relatively functional and lives by herself. She denies any cardiac symptoms.  Past Medical History:  Diagnosis Date  . Arthritis   . [facility])    mastectomy 30 yr ago  . Hypertension   . Hypothyroidism   . kidney calculi   . PONV (postoperative nausea and vomiting)   . Shortness of breath     Past Surgical History:  Procedure Laterality Date  . BREAST SURGERY    . CYSTOSCOPY W/ URETERAL STENT PLACEMENT    . CYSTOSCOPY W/ URETERAL STENT PLACEMENT  01/17/2012   Procedure: CYSTOSCOPY WITH RETROGRADE PYELOGRAM/URETERAL STENT PLACEMENT;  Surgeon: Franchot Gallo, MD;  Location: [facility];  Service: Urology;  Laterality: Left;  . EYE SURGERY       Current Outpatient Prescriptions  Medication Sig Dispense Refill  . amLODipine (NORVASC) 5 MG tablet Take 5 mg by mouth daily with breakfast.     . aspirin EC 81 MG tablet Take 81 mg by mouth daily.    Marland Kitchen azithromycin (ZITHROMAX) 250 MG tablet Take 250-500 mg by mouth daily. Take 500 mg by mouth on day 1, then take 250 mg by mouth on days 2-5    . cetirizine (ZYRTEC) 10 MG tablet Take 10 mg by mouth daily.    Marland Kitchen dextromethorphan (DELSYM) 30 MG/5ML liquid Take 60 mg by mouth as needed for cough.    . dextromethorphan-guaiFENesin (MUCINEX DM) 30-600 MG per 12 hr tablet Take 1 tablet by mouth at bedtime as needed for cough.    Marland Kitchen ibuprofen (ADVIL,MOTRIN) 200 MG tablet Take 400 mg by mouth at bedtime.    Marland Kitchen levothyroxine (SYNTHROID, LEVOTHROID) 50 MCG tablet Take 50 mcg by mouth daily with breakfast.     . losartan (COZAAR) 50 MG tablet Take 50 mg by mouth daily with breakfast.     . meloxicam (MOBIC) 7.5 MG tablet Take 7.5 mg by mouth daily as needed. Pain and inflammation     No current facility-administered medications for this visit.     Allergies:   Codeine  Social History:  The patient  reports that she has never smoked. She has never used smokeless tobacco. She reports that she does not drink alcohol or use drugs.   Family History:  The patient's Family history is remarkable for coronary artery disease.   ROS:  Please see the history of present illness.   Otherwise, review of systems are positive for none.   All other systems are reviewed and negative.    PHYSICAL EXAM: VS:  BP (!) 185/60 (BP Location: Left Arm, Patient Position: Sitting, Cuff Size: Normal)   Pulse 90   Ht 5\' 4"  (1.626 m)  , BMI There is no height or weight on file to calculate BMI. GEN: Well nourished, well developed, in no acute distress  HEENT: normal  Neck: no JVD, carotid bruits, or masses Cardiac: RRR; no  murmurs, rubs, or gallops,no edema  Respiratory:  clear to auscultation bilaterally, normal work of breathing GI: soft, nontender, nondistended, + BS MS: no deformity or atrophy  Skin: warm and dry, no rash Neuro:  Strength and sensation are intact Psych: euthymic mood, full affect Vascular: Femoral pulses are normal bilaterally. Distal pulses are not palpable. There is a small 3 x 3 mm superficial ulcer on the left small toe. There is a slightly beagle are sore at 4 x 4 millimeter superficial ulceration on the lateral ankle   EKG:  EKG is ordered today. The ekg ordered today demonstrates normal sinus rhythm with first-degree AV block. Incomplete right bundle branch block. Moderate LVH with possible old inferior infarct.  Recent Labs: No results found for requested labs within last 8760 hours.    Lipid Panel No results found for: CHOL, TRIG, HDL, CHOLHDL, VLDL, LDLCALC, LDLDIRECT    Wt Readings from Last 3 Encounters:  02/06/12 158 lb (71.7 kg)  01/18/12 160 lb 7.9 oz (72.8 kg)      Other studies Reviewed: Additional studies/ records that were reviewed today include: Office note from the wound center.    ASSESSMENT AND PLAN:  1.  Critical limb ischemia with nonhealing ulcer on the left foot (Rutherford class V). Severely reduced ABI of 0.4 with evidence of relatively long SFA occlusion. I had a prolonged discussion with the patient and her family about management options. Obviously, given her age, her vascular complications with angiography and endovascular intervention are much higher than the average. Also the occlusion in the SFA appears to be chronic and not exactly sure about the duration of that. Thus, the success rate with endovascular intervention in that area is variable and probably in the range of 60-70%. I don't think surgical revascularization with bypass is an option.   I discussed angiography and endovascular intervention in details. They are going to review with  other family members and let me know if they want to proceed. In the meantime, continue care at the wound center.  2. Essential hypertension: Blood pressure is elevated today. Continue treatment with losartan and amlodipine.  Disposition:   FU with me in 1 month  Signed,  [name], MD  05/09/2016 5:13 PM    [place]

## 2016-05-09 NOTE — Patient Instructions (Signed)
Medication Instructions:  Your physician recommends that you continue on your current medications as directed. Please refer to the Current Medication list given to you today.   Labwork: none  Testing/Procedures: none  Follow-Up: Your physician recommends that you schedule a follow-up appointment in: one month with Dr. Fletcher Anon.    Any Other Special Instructions Will Be Listed Below (If Applicable).     If you need a refill on your cardiac medications before your next appointment, please call your pharmacy.

## 2016-05-10 ENCOUNTER — Encounter: Payer: Medicare PPO | Attending: Internal Medicine | Admitting: Internal Medicine

## 2016-05-10 DIAGNOSIS — L97523 Non-pressure chronic ulcer of other part of left foot with necrosis of muscle: Secondary | ICD-10-CM | POA: Insufficient documentation

## 2016-05-10 DIAGNOSIS — E039 Hypothyroidism, unspecified: Secondary | ICD-10-CM | POA: Diagnosis not present

## 2016-05-10 DIAGNOSIS — I10 Essential (primary) hypertension: Secondary | ICD-10-CM | POA: Diagnosis not present

## 2016-05-10 DIAGNOSIS — I70243 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: Secondary | ICD-10-CM | POA: Insufficient documentation

## 2016-05-10 DIAGNOSIS — L97211 Non-pressure chronic ulcer of right calf limited to breakdown of skin: Secondary | ICD-10-CM | POA: Insufficient documentation

## 2016-05-10 DIAGNOSIS — I70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: Secondary | ICD-10-CM | POA: Insufficient documentation

## 2016-05-11 NOTE — Progress Notes (Signed)
[name], [name] ([id number]) Visit Report for 05/10/2016 Chief Complaint Document Details Patient Name: [name], [name] Date of Service: 05/10/2016 2:15 PM Medical Record Patient Account Number: [id number] Number: Treating RN: Ahmed Prima [birth year] (82 y.o. Other Clinician: Date of Birth/Sex: Female) Treating Chelsie Burel Primary Care Physician/Extender: Derrill Memo, [place] Physician: Referring Physician: Laverle Hobby in Treatment: 4 Information Obtained from: Patient Chief Complaint 04/06/16; the patient is here for review of wounds on the left lateral ankle and left 5th toe that been present for a year Electronic Signature(s) Signed: 05/11/2016 7:57:27 AM By: [name] MD Entered By: [name] on 05/10/2016 15:35:15 [name] ([id number]) -------------------------------------------------------------------------------- HPI Details Patient Name: [name] Date of Service: 05/10/2016 2:15 PM Medical Record Patient Account Number: [id number] Number: Treating RN: Carolyne Fiscal, Debi 1919/10/07 (82 y.o. Other Clinician: Date of Birth/Sex: Female) Treating Cristi Gwynn Primary Care Physician/Extender: Derrill Memo, [place] Physician: Referring Physician: Laverle Hobby in Treatment: 4 History of Present Illness HPI Description: 04/06/16; this is a 82 year old woman who arrives accompanied by 2 daughters for a wound on her left ankle and her left fifth toe. These have apparently been present for a year. I'm not quite certain how she came to this clinic however she was being followed by Sharlotte Alamo her podiatrist for these wounds. She was also referred to [facility] and they apparently did a test presumably arterial studies although we don't have any of these results and we couldn't get through to the office today. The family but has been applying a combination of Bactroban and a light bandage and  perhaps more recently Silvadene cream. She did have an x-ray of the foot roughly 6 months ago at the podiatry office the family was unaware that if there were any abnormalities. Apparently they have not seen any healing here. Our intake nurse noted a slight skin tear on the right anterior lower leg. Nobody seemed aware of this. ABIs calculated in this clinic was 0.3 on the right and 0.4 on the left I have reviewed things [facility]. There is very little information on this patient. She apparently follows in current total clinic which we don't have information from. She has mentioned already been to a [facility]. She has a history of hypothyroidism, nephrolithiasis arthritis and has had a previous mastectomy. 04/13/16; patient's x-ray was normal. She is already been to see Dr. Delana Meyer vascular surgery. Her arterial exam was from November 2016 this showed a left ABI of 0.62 her right of 0.76. Her duplex ultrasound of the left leg showed biphasic waves in the common femoral and distal femoral artery however monophasic waves in the superficial femoral artery proximal and mid biphasic it distal. 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 The patient tells me that she has pain at night when she tries to lie down which is improved by getting up and sitting in the chair this sounds like claudication at rest. Her wounds are on the left medial malleolus and the dorsal fifth toe small punched out wounds that are right on bone. We use Santyl last week 04/20/16: nurse informed me pt has declined evaluation for significant PAD. she denies systemic s/s of infections. 04/27/16;; the patient has had noninvasive arterial studies done in November 2016. ABI and the left was 0.62. Monophasic waves at the superficial femoral artery. Occluded to the posterior tibial artery. So had greater than 50% stenosis of the right superficial femoral and greater than 50% stenosis of the left superficial femoral artery. She had  bilateral tibial peroneal artery disease. Both of the wounds on the left fifth toe and left lateral malleolus have been present for more than a year. They have been to see vein and vascular. The patient has some pain but miraculously I think the wounds have largely been stable. No evidence of infection 05/04/16; she goes for a noninvasive study tomorrow and then sees Dr. Fletcher Anon on Monday. By the time she is here next week we should have a better picture of whether something can be done with regards to her arterial flow. We continue to have ischemic-looking wounds on the left fifth toe and left lateral malleolus. 05/10/16; the patient went for her arterial studies and saw Dr. Fletcher Anon. As predicted she is felt to have critical [place], [name] ([id number]) limb ischemia. The feeling is that she has occlusion of the SFA. The feeling would she would be a candidate for a stent to the SFA. The patient did not make a decision to proceed with the procedure and she is here with family members to discuss this me today. He shouldn't has a lot of pain and cannot sleep and rest well at night per her family. Electronic Signature(s) Signed: 05/11/2016 7:57:27 AM By: [name] MD Entered By: [name] on 05/10/2016 15:37:02 [name] ([id number]) -------------------------------------------------------------------------------- Physical Exam Details Patient Name: [name] Date of Service: 05/10/2016 2:15 PM Medical Record Patient Account Number: [id number] Number: Treating RN: Ahmed Prima 1919-02-09 (82 y.o. Other Clinician: Date of Birth/Sex: Female) Treating Felisia Balcom Primary Care Physician/Extender: Derrill Memo, [place] Physician: Referring Physician: Laverle Hobby in Treatment: 4 Notes Wound exam; there is no change in these punched out painful ulcers. One is on the dorsal aspect of her left fifth toe the other on the left lateral malleolus. Electronic  Signature(s) Signed: 05/11/2016 7:57:27 AM By: [name] MD Entered By: [name] on 05/10/2016 15:37:40 [name] ([id number]) -------------------------------------------------------------------------------- Physician Orders Details Patient Name: [name] Date of Service: 05/10/2016 2:15 PM Medical Record Patient Account Number: [id number] Number: Treating RN: Ahmed Prima [birth year] (82 y.o. Other Clinician: Date of Birth/Sex: Female) Treating Ashlie Mcmenamy Primary Care Physician/Extender: Derrill Memo, [place] Physician: Referring Physician: Laverle Hobby in Treatment: 4 Verbal / Phone Orders: Yes Clinician: Carolyne Fiscal, Debi Read Back and Verified: Yes Diagnosis Coding Wound Cleansing Wound #1 Left Malleolus o Clean wound with Normal Saline. - for clinic use o Cleanse wound with mild soap and water o May Shower, gently pat wound dry prior to applying new dressing. Wound #2 Left,Lateral Toe Fifth o Clean wound with Normal Saline. - for clinic use o Cleanse wound with mild soap and water o May Shower, gently pat wound dry prior to applying new dressing. Anesthetic Wound #1 Left Malleolus o Topical Lidocaine 4% cream applied to wound bed prior to debridement - for clinic use Wound #2 Left,Lateral Toe Fifth o Topical Lidocaine 4% cream applied to wound bed prior to debridement - for clinic use Primary Wound Dressing Wound #1 Left Malleolus o Prisma Ag - moisten with saline Wound #2 Left,Lateral Toe Fifth o Prisma Ag - moisten with saline Secondary Dressing Wound #1 Left Malleolus o ABD pad o Gauze and Kerlix/Conform - netting, tape Wound #2 Left,Lateral Toe Fifth o ABD pad o Gauze and Kerlix/Conform [name], [name] ([id number]) Dressing Change Frequency Wound #1 Left Malleolus o Change dressing every other day. Wound #2 Left,Lateral Toe Fifth o Change dressing every other day. Follow-up  Appointments Wound #1 Left Malleolus o Return Appointment in 1 week.  Wound #2 Left,Lateral Toe Fifth o Return Appointment in 1 week. Edema Control Wound #1 Left Malleolus o Elevate legs to the level of the heart and pump ankles as often as possible o Other: - compression stockings Wound #2 Left,Lateral Toe Fifth o Elevate legs to the level of the heart and pump ankles as often as possible o Other: - compression stockings Additional Orders / Instructions Wound #1 Left Malleolus o Increase protein intake. Wound #2 Left,Lateral Toe Fifth o Increase protein intake. Medications-please add to medication list. Wound #1 Left Malleolus o Other: - Vitamin C, Zinc, Multivitamins Wound #2 Left,Lateral Toe Fifth o Other: - Vitamin C, Zinc, Multivitamins Electronic Signature(s) Signed: 05/10/2016 4:35:54 PM By: Alric Quan Signed: 05/11/2016 7:57:27 AM By: [name] MD Entered By: Alric Quan on 05/10/2016 15:08:31 [name] ([id number]) -------------------------------------------------------------------------------- Problem List Details Patient Name: [name] Date of Service: 05/10/2016 2:15 PM Medical Record Patient Account Number: [id number] Number: Treating RN: Carolyne Fiscal, Debi 04-21-1919 (82 y.o. 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 Other Clinician: Date of Birth/Sex: Female) Treating Macsen Nuttall Primary Care Physician/Extender: Derrill Memo, [place] Physician: Referring Physician: Laverle Hobby in Treatment: 4 Subjective Chief Complaint Information obtained from Patient 04/06/16; the patient is here for review of wounds on the left lateral ankle and left 5th toe that been present for a year History of Present Illness (HPI) 04/06/16; this is a 82 year old woman who arrives accompanied by 2 daughters for a wound on her left ankle and her left fifth toe. These have apparently been present for a year. I'm not quite certain how she came to this clinic however she was being followed by Sharlotte Alamo her podiatrist for these wounds. She was also referred to [facility] and they apparently did a test presumably arterial studies although we don't have any of these results and we couldn't get through to the office today. The family but has been applying a combination of Bactroban and a light bandage and perhaps more recently Silvadene cream. She did have an x-ray of the foot roughly 6 months ago at the podiatry office the family was unaware that if there were any abnormalities. Apparently they have not seen any healing here. Our intake nurse noted a slight skin tear on the right anterior lower leg. Nobody seemed aware of this. ABIs calculated in this clinic was 0.3 on the right and 0.4 on the left I have  reviewed things [facility]. There is very little information on this patient. She apparently follows in current total clinic which we don't have information from. She has mentioned already been to a [facility]. She has a history of hypothyroidism, nephrolithiasis arthritis  and has had a previous mastectomy. 04/13/16; patient's x-ray was normal. She is already been to see Dr. Delana Meyer vascular surgery. Her arterial exam was from November 2016 this showed a left ABI of 0.62 her right of 0.76. Her duplex ultrasound of the left leg showed biphasic waves in the common femoral and distal femoral artery however monophasic waves in the superficial femoral artery proximal and mid biphasic it distal. 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 The patient tells me that she has pain at night when she tries to lie down which is improved by getting up and sitting in the chair this sounds like claudication at rest. Her wounds are on the left medial malleolus and the dorsal fifth toe small punched out wounds that are right on bone. We use Santyl last week 04/20/16: nurse informed me pt has declined evaluation for significant PAD. she denies systemic s/s of infections. 04/27/16;; the patient has had noninvasive arterial studies done in November 2016. ABI and the left was 0.62. Monophasic waves at the superficial femoral artery. Occluded to the posterior tibial artery. So had greater than 50% stenosis of the right superficial femoral and greater than 50% stenosis of the left superficial femoral artery. She had bilateral tibial peroneal artery disease. Both of the wounds on the left Severtson, Yamna ([id number]) fifth toe and left lateral malleolus have been present for more than a year. They have been to see vein and vascular. The patient has some pain but miraculously I think the wounds have largely been stable. No evidence of infection 05/04/16; she goes for a noninvasive study tomorrow and then sees Dr. Fletcher Anon on  Monday. By the time she is here next week we should have a better picture of whether something can be done with regards to her arterial flow. We continue to have ischemic-looking wounds on the left fifth toe and left lateral malleolus. 05/10/16; the patient went for her arterial studies and saw Dr. Fletcher Anon. As predicted she is felt to have critical limb ischemia. The feeling is that she has occlusion of the SFA. The feeling would she would be a candidate for a stent to the SFA. The patient did not make a decision to proceed with the procedure and she is here with family members to discuss this me today. He shouldn't has a lot of pain and cannot sleep and rest well at night per her family. Objective Constitutional Vitals Time Taken: 2:38 PM, Height: 64 in, Weight: 158 lbs, BMI: 27.1, Temperature: 98.1 F, Pulse: 86 bpm, Respiratory Rate: 17 breaths/min, Blood Pressure: 163/50 mmHg. Integumentary (Hair, Skin) Wound #1 status is Open. Original cause of wound was Gradually Appeared. The wound is located on the Left Malleolus. The wound measures 0.9cm length x 0.8cm width x 0.3cm depth; 0.565cm^2 area and 0.17cm^3 volume. The wound is limited to skin breakdown. There is no tunneling or undermining noted. There is a large amount of serous drainage noted. The wound margin is thickened. There is no granulation within the wound bed. There is a large (67-100%) amount of necrotic tissue within the wound bed including Adherent Slough. The periwound skin appearance exhibited: Localized Edema, Erythema. The surrounding wound skin color is noted with erythema which is circumferential. Periwound temperature was noted as No Abnormality. The periwound has tenderness on palpation. Wound #2 status is Open. Original cause of wound was Gradually Appeared. The wound is located on the Left,Lateral Toe Fifth. The wound measures 0.5cm length x 0.7cm width x 0.3cm depth; 0.275cm^2 area and 0.082cm^3 volume.  The wound is  limited to skin breakdown. There is no tunneling or undermining noted. There is a large amount of serous drainage noted. The wound margin is thickened. There is no granulation within the wound bed. There is a large (67-100%) amount of necrotic tissue within the wound bed including Adherent Slough. The periwound skin appearance exhibited: Localized Edema, Erythema. The surrounding wound skin color is noted with erythema which is circumferential. Periwound temperature was noted as No Abnormality. The periwound has tenderness on palpation. 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 - for clinic use Cleanse wound with mild soap and water May Shower, gently pat wound dry prior to applying new dressing. Wound #2 Left,Lateral Toe Fifth: Clean wound with Normal Saline. - for clinic use Cleanse wound with mild soap and water May Shower, gently pat wound dry prior to applying new dressing. Anesthetic: Wound #1 Left Malleolus: Topical Lidocaine 4% cream applied to wound bed prior to debridement - for clinic use Wound #2 Left,Lateral Toe Fifth: Topical Lidocaine 4% cream applied to wound bed prior to debridement - for clinic use Primary Wound Dressing: Wound #1 Left Malleolus: Prisma Ag - moisten with saline Wound #2 Left,Lateral Toe Fifth: Prisma Ag - moisten with saline Secondary Dressing: Wound #1 Left Malleolus: ABD pad Gauze and Kerlix/Conform - netting, tape Wound #2 Left,Lateral Toe Fifth: ABD pad Gauze and Kerlix/Conform Dressing Change  Frequency: Wound #1 Left Malleolus: Change dressing every other day. Wound #2 Left,Lateral Toe Fifth: Change dressing every other day. Follow-up Appointments: Wound #1 Left Malleolus: Return Appointment in 1 week. Wound #2 Left,Lateral Toe Fifth: Return Appointment in 1 week. Edema Control: [name], [name] ([id number]) Wound #1 Left Malleolus: Elevate legs to the level of the heart and pump ankles as often as possible Other: - compression stockings Wound #2 Left,Lateral Toe Fifth: Elevate legs to the level of the heart and pump ankles as often as possible Other: - compression stockings Additional Orders / Instructions: Wound #1 Left Malleolus: Increase protein intake. Wound #2 Left,Lateral Toe Fifth: Increase protein intake. Medications-please add to medication list.: Wound #1 Left Malleolus: Other: - Vitamin C, Zinc, Multivitamins Wound #2 Left,Lateral Toe Fifth: Other: - Vitamin C, Zinc, Multivitamins #1 I have encouraged the patient to proceed with the angiogram and take the risk. I think they're all aware there are no guarantees that the angiogram and attempted stent will be successful and that her her age the risk of complication is higher. Nevertheless the patient is having trouble resting at night and it is likely that this will worsen with time. Finally she is at risk of developing further ulcerations. We will continue with silver collagen to the wounds albeit of these will not heal without more perfusion Electronic Signature(s) Signed: 05/11/2016 7:57:27 AM By: [name] MD Entered By: [name] on 05/10/2016 15:39:08 [name] ([id number]) -------------------------------------------------------------------------------- SuperBill Details Patient Name: [name] Date of Service: 05/10/2016 Medical Record Patient Account Number: [id number] Number: Treating RN: Ahmed Prima [birth year] (82 y.o. 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

## 2016-05-11 NOTE — Progress Notes (Addendum)
VONICE, CROMLEY ([id number]) Visit Report for 05/10/2016 Arrival Information Details Patient Name: [name], [name] Date of Service: 05/10/2016 2:15 PM Medical Record Patient Account Number: [id number] Number: Treating RN: Ahmed Prima 1919-06-28 (82 y.o. Other Clinician: Date of Birth/Sex: Female) Treating ROBSON, MICHAEL Primary Care Physician: Lorelee Market Physician/Extender: G Referring Physician: Laverle Hobby in Treatment: 4 Visit Information History Since Last Visit All ordered tests and consults were completed: No Patient Arrived: Ambulatory Added or deleted any medications: No Arrival Time: 14:37 Any new allergies or adverse reactions: No Accompanied By: daughter Had a fall or experienced change in No Transfer Assistance: None activities of daily living that may affect Patient Identification Verified: Yes risk of falls: Secondary Verification Process Yes Signs or symptoms of abuse/neglect since last No Completed: visito Patient Requires Transmission- No Hospitalized since last visit: No Based Precautions: Pain Present Now: No Patient Has Alerts: Yes Patient Alerts: 7/31 ABI: R-0.55 L-0.40 [facility] Electronic Signature(s) Signed: 05/11/2016 11:29:00 AM By: Gretta Cool, RN, BSN, Kim RN, BSN Previous Signature: 05/11/2016 11:28:07 AM Version By: Gretta Cool RN, BSN, Kim RN, BSN Previous Signature: 05/10/2016 4:35:54 PM Version By: Alric Quan Entered By: Gretta Cool RN, BSN, Kim on 05/11/2016 11:29:00 [name] ([id number]) -------------------------------------------------------------------------------- Clinic Level of Care Assessment Details Patient Name: [name] Date of Service: 05/10/2016 2:15 PM Medical Record Patient Account Number: [id number] Number: Treating RN: Ahmed Prima May 22, 1919 (82 y.o. Other Clinician: Date of Birth/Sex: Female) Treating ROBSON, MICHAEL Primary Care Physician: Lorelee Market Physician/Extender: G Referring Physician: Laverle Hobby in Treatment: 4 Clinic Level of Care Assessment Items TOOL 4 Quantity Score X - Use when only an EandM is performed on FOLLOW-UP visit 1 0 ASSESSMENTS - Nursing Assessment / Reassessment X - Reassessment of Co-morbidities (includes updates in patient status) 1 10 X - Reassessment of Adherence to Treatment Plan 1 5 ASSESSMENTS - Wound and Skin Assessment / Reassessment []  - Simple Wound Assessment / Reassessment - one wound 0 X - Complex Wound Assessment / Reassessment - multiple wounds 2 5 []  - Dermatologic / Skin Assessment (not related to wound area) 0 ASSESSMENTS - Focused Assessment []  - Circumferential Edema Measurements - multi extremities 0 []  - Nutritional Assessment / Counseling / Intervention 0 []  - Lower Extremity Assessment (monofilament, tuning fork, pulses) 0 []  - Peripheral Arterial Disease Assessment (using hand held doppler) 0 ASSESSMENTS - Ostomy and/or Continence Assessment and Care []  - Incontinence Assessment and Management 0 []  - Ostomy Care Assessment and Management (repouching, etc.) 0 PROCESS - Coordination of Care X - Simple Patient / Family Education for ongoing care 1 15 []  - Complex (extensive) Patient / Family Education for ongoing care 0 []  - Staff obtains Programmer, systems, Records, Test Results / Process Orders 0 []  - Staff telephones HHA, Nursing Homes / Clarify orders / etc 0 [name], [name] ([id number]) []  - Routine Transfer to another Facility (non-emergent condition) 0 []  - Routine Hospital Admission (non-emergent condition) 0 []  - New Admissions / Biomedical engineer / Ordering NPWT, Apligraf, etc. 0 []  - Emergency Hospital Admission (emergent condition) 0 X - Simple Discharge Coordination 1 10 []  - Complex (extensive) Discharge Coordination 0 PROCESS - Special Needs []  - Pediatric / Minor Patient Management 0 []  - Isolation Patient Management 0 []  - Hearing / Language /  Visual special needs 0 []  - Assessment of Community assistance (transportation, D/C planning, etc.) 0 []  - Additional assistance / Altered mentation 0 []  - Support Surface(s) Assessment (bed, cushion, seat, etc.) 0 INTERVENTIONS - Wound Cleansing /  Measurement X - Simple Wound Cleansing - one wound 1 5 X - Complex Wound Cleansing - multiple wounds 2 5 X - Wound Imaging (photographs - any number of wounds) 1 5 []  - Wound Tracing (instead of photographs) 0 []  - Simple Wound Measurement - one wound 0 X - Complex Wound Measurement - multiple wounds 2 5 INTERVENTIONS - Wound Dressings X - Small Wound Dressing one or multiple wounds 2 10 []  - Medium Wound Dressing one or multiple wounds 0 []  - Large Wound Dressing one or multiple wounds 0 X - Application of Medications - topical 1 5 []  - Application of Medications - injection 0 [name], [name] ([id number]) INTERVENTIONS - Miscellaneous []  - External ear exam 0 []  - Specimen Collection (cultures, biopsies, blood, body fluids, etc.) 0 []  - Specimen(s) / Culture(s) sent or taken to Lab for analysis 0 []  - Patient Transfer (multiple staff / Harrel Lemon Lift / Similar devices) 0 []  - Simple Staple / Suture removal (25 or less) 0 []  - Complex Staple / Suture removal (26 or more) 0 []  - Hypo / Hyperglycemic Management (close monitor of Blood Glucose) 0 []  - Ankle / Brachial Index (ABI) - do not check if billed separately 0 X - Vital Signs 1 5 Has the patient been seen at the hospital within the last three years: Yes Total Score: 110 Level Of Care: New/Established - Level 3 Electronic Signature(s) Signed: 05/10/2016 4:35:54 PM By: Alric Quan Entered By: Alric Quan on 05/10/2016 16:17:16 [name] ([id number]) -------------------------------------------------------------------------------- Encounter Discharge Information Details Patient Name: [name] Date of Service: 05/10/2016 2:15 PM Medical Record Patient Account Number:  [id number] Number: Treating RN: Ahmed Prima June 21, 1919 (82 y.o. Other Clinician: Date of Birth/Sex: Female) Treating ROBSON, MICHAEL Primary Care Physician: Lorelee Market Physician/Extender: G Referring Physician: Laverle Hobby in Treatment: 4 Encounter Discharge Information Items Discharge Pain Level: 0 Discharge Condition: Stable Ambulatory Status: Ambulatory Discharge Destination: Home Transportation: Private Auto Accompanied By: family Schedule Follow-up Appointment: Yes Medication Reconciliation completed and provided to Patient/Care Yes Carisma Troupe: Provided on Clinical Summary of Care: 05/10/2016 Form Type Recipient Paper Patient [facility] Electronic Signature(s) Signed: 05/10/2016 3:21:54 PM By: Ruthine Dose Entered By: Ruthine Dose on 05/10/2016 15:21:54 [name] ([id number]) -------------------------------------------------------------------------------- Lower Extremity Assessment Details Patient Name: [name] Date of Service: 05/10/2016 2:15 PM Medical Record Patient Account Number: [id number] Number: Treating RN: Ahmed Prima 08/24/1919 (82 y.o. Other Clinician: Date of Birth/Sex: Female) Treating ROBSON, MICHAEL Primary Care Physician: Lorelee Market Physician/Extender: G Referring Physician: Laverle Hobby in Treatment: 4 Vascular Assessment Pulses: Posterior Tibial Dorsalis Pedis Palpable: [Left:No] Doppler: [Left:Multiphasic] Extremity colors, hair growth, and conditions: Extremity Color: [Left:Mottled] Temperature of Extremity: [Left:Warm] Capillary Refill: [Left:< 3 seconds] Toe Nail Assessment Left: Right: Thick: No Discolored: No Deformed: No Improper Length and Hygiene: No Electronic Signature(s) Signed: 05/10/2016 4:35:54 PM By: Alric Quan Entered By: Alric Quan on 05/10/2016 14:43:52 [name]  ([id number]) -------------------------------------------------------------------------------- Multi Wound Chart Details Patient Name: [name] Date of Service: 05/10/2016 2:15 PM Medical Record Patient Account Number: [id number] Number: Treating RN: Ahmed Prima 1918-11-14 (82 y.o. Other Clinician: Date of Birth/Sex: Female) Treating ROBSON, [place] Primary Care Physician: Lorelee Market Physician/Extender: G Referring Physician: Laverle Hobby in Treatment: 4 Vital Signs Height(in): 64 Pulse(bpm): 86 Weight(lbs): 158 Blood Pressure 163/50 (mmHg): Body Mass Index(BMI): 27 Temperature(F): 98.1 Respiratory Rate 17 (breaths/min): Photos: [1:No Photos] [2:No Photos] [N/A:N/A] Wound Location: [1:Left Malleolus] [2:Left Toe Fifth - Lateral] [N/A:N/A] Wounding Event: [1:Gradually Appeared] [2:Gradually Appeared] [N/A:N/A]  Primary Etiology: [1:Venous Leg Ulcer] [2:Arterial Insufficiency Ulcer N/A] Comorbid History: [1:Cataracts, Hypertension, Osteoarthritis] [2:Cataracts, Hypertension, N/A Osteoarthritis] Date Acquired: [1:04/07/2015] [2:04/07/2015] [N/A:N/A] Weeks of Treatment: [1:4] [2:4] [N/A:N/A] Wound Status: [1:Open] [2:Open] [N/A:N/A] Measurements L x W x D 0.9x0.8x0.3 [2:0.5x0.7x0.3] [N/A:N/A] (cm) Area (cm) : [1:0.565] [2:0.275] [N/A:N/A] Volume (cm) : [1:0.17] [2:0.082] [N/A:N/A] % Reduction in Area: [1:-46.80%] [2:-485.10%] [N/A:N/A] % Reduction in Volume: -120.80% [2:-811.10%] [N/A:N/A] Classification: [1:Partial Thickness] [2:Partial Thickness] [N/A:N/A] Exudate Amount: [1:Large] [2:Large] [N/A:N/A] Exudate Type: [1:Serous] [2:Serous] [N/A:N/A] Exudate Color: [1:amber] [2:amber] [N/A:N/A] Wound Margin: [1:Thickened] [2:Thickened] [N/A:N/A] Granulation Amount: [1:None Present (0%)] [2:None Present (0%)] [N/A:N/A] Necrotic Amount: [1:Large (67-100%)] [2:Large (67-100%)] [N/A:N/A] Exposed Structures: [1:Fascia: No Fat: No Tendon: No  Muscle: No Joint: No Bone: No] [2:Fascia: No Fat: No Tendon: No Muscle: No Joint: No Bone: No] [N/A:N/A] Limited to Skin Limited to Skin Breakdown Breakdown Epithelialization: None None N/A Periwound Skin Texture: Edema: Yes Edema: Yes N/A Periwound Skin No Abnormalities Noted No Abnormalities Noted N/A Moisture: Periwound Skin Color: Erythema: Yes Erythema: Yes N/A Erythema Location: Circumferential Circumferential N/A Temperature: No Abnormality No Abnormality N/A Tenderness on Yes Yes N/A Palpation: Wound Preparation: Ulcer Cleansing: Ulcer Cleansing: N/A Rinsed/Irrigated with Rinsed/Irrigated with Saline Saline Topical Anesthetic Topical Anesthetic Applied: Other: lidocaine Applied: Other: lidocaine 4% 4% Treatment Notes Electronic Signature(s) Signed: 05/10/2016 4:35:54 PM By: Alric Quan Entered By: Alric Quan on 05/10/2016 14:51:06 [name] ([id number]) -------------------------------------------------------------------------------- [place] Details Patient Name: [name] Date of Service: 05/10/2016 2:15 PM Medical Record Patient Account Number: [id number] Number: Treating RN: Ahmed Prima [birth year] (82 y.o. Other Clinician: Date of Birth/Sex: Female) Treating ROBSON, [place] Primary Care Physician: Lorelee Market Physician/Extender: G Referring Physician: Laverle Hobby in Treatment: 4 Active Inactive Abuse / Safety / Falls / Self Care Management Nursing Diagnoses: Potential for falls Goals: Patient will remain injury free Date Initiated: 04/06/2016 Goal Status: Active Interventions: Assess fall risk on admission and as needed Notes: Nutrition Nursing Diagnoses: Imbalanced nutrition Goals: Patient/caregiver agrees to and verbalizes understanding of need to use nutritional supplements and/or vitamins as prescribed Date Initiated: 04/06/2016 Goal Status: Active Interventions: Assess  patient nutrition upon admission and as needed per policy Notes: Orientation to the Wound Care Program Nursing Diagnoses: Knowledge deficit related to the wound healing center program [name], [name] ([id number]) Goals: Patient/caregiver will verbalize understanding of the [place] Program Date Initiated: 04/06/2016 Goal Status: Active Interventions: Provide education on orientation to the wound center Notes: Pain, Acute or Chronic Nursing Diagnoses: Pain, acute or chronic: actual or potential Potential alteration in comfort, pain Goals: Patient will verbalize adequate pain control and receive pain control interventions during procedures as needed Date Initiated: 04/06/2016 Goal Status: Active Interventions: Assess comfort goal upon admission Complete pain assessment as per visit requirements Notes: Wound/Skin Impairment Nursing Diagnoses: Impaired tissue integrity Goals: Ulcer/skin breakdown will have a volume reduction of 30% by week 4 Date Initiated: 04/06/2016 Goal Status: Active Ulcer/skin breakdown will have a volume reduction of 50% by week 8 Date Initiated: 04/06/2016 Goal Status: Active Ulcer/skin breakdown will have a volume reduction of 80% by week 12 Date Initiated: 04/06/2016 Goal Status: Active Interventions: Assess patient/caregiver ability to obtain necessary supplies [name], [name] ([id number]) Assess ulceration(s) every visit Notes: Electronic Signature(s) Signed: 05/10/2016 4:35:54 PM By: Alric Quan Entered By: Alric Quan on 05/10/2016 14:50:57 [name] ([id number]) -------------------------------------------------------------------------------- Pain Assessment Details Patient Name: [name] Date of Service: 05/10/2016 2:15 PM Medical Record Patient Account Number: [id number] Number: Treating RN: Carolyne Fiscal, Debi 01-13-1919 (82  y.o. Other Clinician: Date of Birth/Sex: Female) Treating ROBSON,  MICHAEL Primary Care Physician: Lorelee Market Physician/Extender: G Referring Physician: Laverle Hobby in Treatment: 4 Active Problems Location of Pain Severity and Description of Pain Patient Has Paino No Site Locations With Dressing Change: No Pain Management and Medication Current Pain Management: Electronic Signature(s) Signed: 05/10/2016 4:35:54 PM By: Alric Quan Entered By: Alric Quan on 05/10/2016 14:38:22 [name] ([id number]) -------------------------------------------------------------------------------- Patient/Caregiver Education Details Patient Name: [name] Date of Service: 05/10/2016 2:15 PM Medical Record Patient Account Number: [id number] Number: Treating RN: Carolyne Fiscal, Debi 10-Jan-1919 (82 y.o. Other Clinician: Date of Birth/Gender: Female) Treating ROBSON, [place] Primary Care Physician: Lorelee Market Physician/Extender: G Referring Physician: Laverle Hobby in Treatment: 4 Education Assessment Education Provided To: Patient Education Topics Provided Wound/Skin Impairment: Handouts: Other: change dressing as ordered Methods: Demonstration, Explain/Verbal Responses: State content correctly Electronic Signature(s) Signed: 05/10/2016 4:35:54 PM By: Alric Quan Entered By: Alric Quan on 05/10/2016 14:54:27 [name] ([id number]) -------------------------------------------------------------------------------- Wound Assessment Details Patient Name: [name] Date of Service: 05/10/2016 2:15 PM Medical Record Patient Account Number: [id number] Number: Treating RN: Ahmed Prima [birth year] (82 y.o. Other Clinician: Date of Birth/Sex: Female) Treating ROBSON, [place] Primary Care Physician: Lorelee Market Physician/Extender: G Referring Physician: Laverle Hobby in Treatment: 4 Wound Status Wound Number: 1 Primary Venous Leg  Ulcer Etiology: Wound Location: Left Malleolus Wound Status: Open Wounding Event: Gradually Appeared Comorbid Cataracts, Hypertension, Date Acquired: 04/07/2015 History: Osteoarthritis Weeks Of Treatment: 4 Clustered Wound: No Photos Photo Uploaded By: Alric Quan on 05/10/2016 16:18:19 Wound Measurements Length: (cm) 0.9 Width: (cm) 0.8 Depth: (cm) 0.3 Area: (cm) 0.565 Volume: (cm) 0.17 % Reduction in Area: -46.8% % Reduction in Volume: -120.8% Epithelialization: None Tunneling: No Undermining: No Wound Description Classification: Partial Thickness Foul Odor Afte Wound Margin: Thickened Exudate Amount: Large Exudate Type: Serous Exudate Color: amber r Cleansing: No Wound Bed Granulation Amount: None Present (0%) Exposed Structure Necrotic Amount: Large (67-100%) Fascia Exposed: No Necrotic Quality: Adherent Slough Fat Layer Exposed: No [name], [name] ([id number]) Tendon Exposed: No Muscle Exposed: No Joint Exposed: No Bone Exposed: No Limited to Skin Breakdown Periwound Skin Texture Texture Color No Abnormalities Noted: No No Abnormalities Noted: No Localized Edema: Yes Erythema: Yes Erythema Location: Circumferential Moisture No Abnormalities Noted: No Temperature / Pain Temperature: No Abnormality Tenderness on Palpation: Yes Wound Preparation Ulcer Cleansing: Rinsed/Irrigated with Saline Topical Anesthetic Applied: Other: lidocaine 4%, Treatment Notes Wound #1 (Left Malleolus) 1. Cleansed with: Clean wound with Normal Saline 2. Anesthetic Topical Lidocaine 4% cream to wound bed prior to debridement 4. Dressing Applied: Prisma Ag 5. Secondary Dressing Applied ABD Pad Dry Gauze Kerlix/Conform 7. Secured with Tape Notes netting Electronic Signature(s) Signed: 05/10/2016 4:35:54 PM By: Alric Quan Entered By: Alric Quan on 05/10/2016 14:48:59 [name]  ([id number]) -------------------------------------------------------------------------------- Wound Assessment Details Patient Name: [name] Date of Service: 05/10/2016 2:15 PM Medical Record Patient Account Number: [id number] Number: Treating RN: Ahmed Prima Feb 18, 1919 (82 y.o. Other Clinician: Date of Birth/Sex: Female) Treating ROBSON, MICHAEL Primary Care Physician: Lorelee Market Physician/Extender: G Referring Physician: Laverle Hobby in Treatment: 4 Wound Status Wound Number: 2 Primary Arterial Insufficiency Ulcer Etiology: Wound Location: Left Toe Fifth - Lateral Wound Status: Open Wounding Event: Gradually Appeared Comorbid Cataracts, Hypertension, Date Acquired: 04/07/2015 History: Osteoarthritis Weeks Of Treatment: 4 Clustered Wound: No Photos Photo Uploaded By: Alric Quan on 05/10/2016 16:18:19 Wound Measurements Length: (cm) 0.5 Width: (cm) 0.7 Depth: (cm) 0.3 Area: (cm) 0.275 Volume: (cm) 0.082 %  Reduction in Area: -485.1% % Reduction in Volume: -811.1% Epithelialization: None Tunneling: No Undermining: No Wound Description Classification: Partial Thickness Foul Odor Afte Wound Margin: Thickened Exudate Amount: Large Exudate Type: Serous Exudate Color: amber r Cleansing: No Wound Bed Granulation Amount: None Present (0%) Exposed Structure Necrotic Amount: Large (67-100%) Fascia Exposed: No Necrotic Quality: Adherent Slough Fat Layer Exposed: No [name], [name] ([id number]) Tendon Exposed: No Muscle Exposed: No Joint Exposed: No Bone Exposed: No Limited to Skin Breakdown Periwound Skin Texture Texture Color No Abnormalities Noted: No No Abnormalities Noted: No Localized Edema: Yes Erythema: Yes Erythema Location: Circumferential Moisture No Abnormalities Noted: No Temperature / Pain Temperature: No Abnormality Tenderness on Palpation: Yes Wound Preparation Ulcer Cleansing: Rinsed/Irrigated  with Saline Topical Anesthetic Applied: Other: lidocaine 4%, Treatment Notes Wound #2 (Left, Lateral Toe Fifth) 1. Cleansed with: Clean wound with Normal Saline 2. Anesthetic Topical Lidocaine 4% cream to wound bed prior to debridement 4. Dressing Applied: Prisma Ag 5. Secondary Dressing Applied ABD Pad Dry Gauze Kerlix/Conform 7. Secured with Tape Notes netting Electronic Signature(s) Signed: 05/10/2016 4:35:54 PM By: Alric Quan Entered By: Alric Quan on 05/10/2016 14:49:41 [name] ([id number]) -------------------------------------------------------------------------------- [place] Details Patient Name: [name] Date of Service: 05/10/2016 2:15 PM Medical Record Patient Account Number: [id number] Number: Treating RN: Ahmed Prima 06-12-1919 (82 y.o. Other Clinician: Date of Birth/Sex: Female) Treating ROBSON, [place] Primary Care Physician: Lorelee Market Physician/Extender: G Referring Physician: Laverle Hobby in Treatment: 4 Vital Signs Time Taken: 14:38 Temperature (F): 98.1 Height (in): 64 Pulse (bpm): 86 Weight (lbs): 158 Respiratory Rate (breaths/min): 17 Body Mass Index (BMI): 27.1 Blood Pressure (mmHg): 163/50 Reference Range: 80 - 120 mg / dl Electronic Signature(s) Signed: 05/10/2016 4:35:54 PM By: Alric Quan Entered By: Alric Quan on 05/10/2016 14:42:37

## 2016-05-13 ENCOUNTER — Other Ambulatory Visit: Payer: Self-pay

## 2016-05-13 ENCOUNTER — Telehealth: Payer: Self-pay | Admitting: Cardiovascular Disease

## 2016-05-13 ENCOUNTER — Encounter: Payer: Self-pay | Admitting: Cardiovascular Disease

## 2016-05-13 DIAGNOSIS — Z01812 Encounter for preprocedural laboratory examination: Secondary | ICD-10-CM

## 2016-05-13 NOTE — Telephone Encounter (Signed)
Daughter [name] called to report pt would like to schedule angiography and endovascular intervention.  Per 7/31 MD notes: 1.  Critical limb ischemia with nonhealing ulcer on the left foot (Rutherford class V). Severely reduced ABI of 0.4 with evidence of relatively long SFA occlusion. I had a prolonged discussion with the patient and her family about management options. Obviously, given her age, her vascular complications with angiography and endovascular intervention are much higher than the average. Also the occlusion in the SFA appears to be chronic and not exactly sure about the duration of that. Thus, the success rate with endovascular intervention in that area is variable and probably in the range of 60-70%. I don't think surgical revascularization with bypass is an option.   I discussed angiography and endovascular intervention in details. They are going to review with other family members and let me know if they want to proceed. In the meantime, continue care at the wound center.  Forward to MD to make aware.

## 2016-05-13 NOTE — Telephone Encounter (Signed)
Schedule abdominal aortogram, left lower extremity angiography and possible angioplasty next week. Needs precath labs.

## 2016-05-13 NOTE — Telephone Encounter (Signed)
error 

## 2016-05-13 NOTE — Telephone Encounter (Signed)
Patient's daughter [name] called. Her mother is ready to schedule the stent. Please call daughter at [phone].

## 2016-05-13 NOTE — Telephone Encounter (Signed)
Scheduled procedure August 9, 8am arrival at [facility]. Lab orders placed to be drawn at [facility]. S/w pt daugther, [name], who is agreeable to 8/9 date for procedure. Reviewed instructions w/[name] who verbalized understanding. She will take pt to have labs at [facility] Monday and come to our office to pick up written instructions. She is appreciative of the call and has no further questions at this time. Instructions placed at the front desk.

## 2016-05-16 ENCOUNTER — Other Ambulatory Visit
Admission: RE | Admit: 2016-05-16 | Discharge: 2016-05-16 | Disposition: A | Discharge status: Home / Self Care | Payer: Medicare PPO | Source: Ambulatory Visit | Attending: Cardiovascular Disease | Admitting: Cardiovascular Disease

## 2016-05-16 ENCOUNTER — Telehealth: Payer: Self-pay | Admitting: Cardiovascular Disease

## 2016-05-16 DIAGNOSIS — Z01812 Encounter for preprocedural laboratory examination: Secondary | ICD-10-CM | POA: Insufficient documentation

## 2016-05-16 LAB — BASIC METABOLIC PANEL
ANION GAP: 5 (ref 5–15)
BUN: 17 mg/dL (ref 6–20)
CO2: 27 mmol/L (ref 22–32)
Calcium: 9.2 mg/dL (ref 8.9–10.3)
Chloride: 110 mmol/L (ref 101–111)
Creatinine, Ser: 0.69 mg/dL (ref 0.44–1.00)
GFR calc Af Amer: >60 mL/min (ref >60)
GFR calc non Af Amer: >60 mL/min (ref >60)
GLUCOSE: 128 mg/dL — AB (ref 65–99)
Potassium: 3.5 mmol/L (ref 3.5–5.1)
SODIUM: 142 mmol/L (ref 135–145)

## 2016-05-16 LAB — CBC
HCT: 38 % (ref 35.0–47.0)
Hemoglobin: 13.4 g/dL (ref 12.0–16.0)
MCH: 31.9 pg (ref 26.0–34.0)
MCHC: 35.3 g/dL (ref 32.0–36.0)
MCV: 90.3 fL (ref 80.0–100.0)
PLATELETS: 297 10*3/uL (ref 150–440)
RBC: 4.2 MIL/uL (ref 3.80–5.20)
RDW: 14.2 % (ref 11.5–14.5)
WBC: 7.7 10*3/uL (ref 3.6–11.0)

## 2016-05-16 LAB — PROTIME-INR
INR: 0.99
PROTHROMBIN TIME: 13.1 s (ref 11.4–15.2)

## 2016-05-16 NOTE — Telephone Encounter (Signed)
Patient daughter wants to know when or if patient needs to dc taking advil for pain prior to Stent placement.  Please call to discuss.   What else can she take for pain

## 2016-05-16 NOTE — Telephone Encounter (Signed)
I spoke with pt's daughter, [name].  [name] states pt has been taking advil 600mg  at bedtime, and occasionally during the day,for pain in her left leg. [name] states this does help the pain. [name] asking if okay to continue advil since procedure is scheduled for 05/18/16. [name] asking if pt should discontinue advil for procedure what would be Dr Tyrell Antonio recommendation for pain relief.  [name] advised I will forward to Dr Fletcher Anon for review.

## 2016-05-17 ENCOUNTER — Telehealth: Payer: Self-pay | Admitting: Cardiovascular Disease

## 2016-05-17 NOTE — Telephone Encounter (Signed)
Left message on pt daughter, [name]'s, cell VM to review procedure instructions.

## 2016-05-17 NOTE — Telephone Encounter (Signed)
Pt's daughter, [name], advised Dr Fletcher Anon recommended no Advil tonight, can use Tylenol

## 2016-05-17 NOTE — Telephone Encounter (Signed)
Do not take Advil tonight. Can use Tylenol.

## 2016-05-18 ENCOUNTER — Encounter (HOSPITAL_COMMUNITY): Payer: Self-pay | Admitting: General Practice

## 2016-05-18 ENCOUNTER — Ambulatory Visit (HOSPITAL_COMMUNITY)
Admission: RE | Admit: 2016-05-18 | Discharge: 2016-05-19 | Disposition: A | Discharge status: Home / Self Care | Payer: Medicare PPO | Source: Ambulatory Visit | Attending: Cardiovascular Disease | Admitting: Cardiovascular Disease

## 2016-05-18 ENCOUNTER — Encounter (HOSPITAL_COMMUNITY)
Admission: RE | Disposition: A | Discharge status: Home / Self Care | Payer: Self-pay | Source: Ambulatory Visit | Attending: Cardiovascular Disease

## 2016-05-18 DIAGNOSIS — Z7982 Long term (current) use of aspirin: Secondary | ICD-10-CM | POA: Insufficient documentation

## 2016-05-18 DIAGNOSIS — Z79899 Other long term (current) drug therapy: Secondary | ICD-10-CM | POA: Diagnosis not present

## 2016-05-18 DIAGNOSIS — I10 Essential (primary) hypertension: Secondary | ICD-10-CM | POA: Insufficient documentation

## 2016-05-18 DIAGNOSIS — L97529 Non-pressure chronic ulcer of other part of left foot with unspecified severity: Secondary | ICD-10-CM | POA: Insufficient documentation

## 2016-05-18 DIAGNOSIS — I70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: Secondary | ICD-10-CM | POA: Insufficient documentation

## 2016-05-18 DIAGNOSIS — I739 Peripheral vascular disease, unspecified: Secondary | ICD-10-CM

## 2016-05-18 DIAGNOSIS — E785 Hyperlipidemia, unspecified: Secondary | ICD-10-CM | POA: Diagnosis not present

## 2016-05-18 DIAGNOSIS — Z7902 Long term (current) use of antithrombotics/antiplatelets: Secondary | ICD-10-CM | POA: Diagnosis not present

## 2016-05-18 DIAGNOSIS — I70229 Atherosclerosis of native arteries of extremities with rest pain, unspecified extremity: Secondary | ICD-10-CM | POA: Diagnosis present

## 2016-05-18 DIAGNOSIS — I70248 Atherosclerosis of native arteries of left leg with ulceration of other part of lower left leg: Secondary | ICD-10-CM | POA: Diagnosis not present

## 2016-05-18 DIAGNOSIS — I998 Other disorder of circulatory system: Secondary | ICD-10-CM | POA: Diagnosis present

## 2016-05-18 DIAGNOSIS — E039 Hypothyroidism, unspecified: Secondary | ICD-10-CM | POA: Diagnosis not present

## 2016-05-18 HISTORY — DX: Family history of other specified conditions: Z84.89

## 2016-05-18 HISTORY — DX: Malignant neoplasm of unspecified site of right female breast: C50.911

## 2016-05-18 HISTORY — PX: PERIPHERAL VASCULAR CATHETERIZATION: SHX172C

## 2016-05-18 HISTORY — DX: Peripheral vascular disease, unspecified: I73.9

## 2016-05-18 HISTORY — DX: Acute embolism and thrombosis of unspecified deep veins of unspecified lower extremity: I82.409

## 2016-05-18 HISTORY — PX: BALLOON ANGIOPLASTY, ARTERY: SHX564

## 2016-05-18 LAB — POCT ACTIVATED CLOTTING TIME
ACTIVATED CLOTTING TIME: 213 s
ACTIVATED CLOTTING TIME: 202 s
ACTIVATED CLOTTING TIME: 202 s
ACTIVATED CLOTTING TIME: 169 s
Activated Clotting Time: 186 seconds
Activated Clotting Time: 186 seconds

## 2016-05-18 LAB — CBC
HCT: 39.3 % (ref 36.0–46.0)
Hemoglobin: 12.6 g/dL (ref 12.0–15.0)
MCH: 29.9 pg (ref 26.0–34.0)
MCHC: 32.1 g/dL (ref 30.0–36.0)
MCV: 93.1 fL (ref 78.0–100.0)
PLATELETS: 273 10*3/uL (ref 150–400)
RBC: 4.22 MIL/uL (ref 3.87–5.11)
RDW: 13.4 % (ref 11.5–15.5)
WBC: 8.8 10*3/uL (ref 4.0–10.5)

## 2016-05-18 LAB — CREATININE, SERUM
CREATININE: 0.63 mg/dL (ref 0.44–1.00)
GFR calc Af Amer: >60 mL/min (ref >60)

## 2016-05-18 SURGERY — ABDOMINAL AORTOGRAM W/LOWER EXTREMITY
Anesthesia: LOCAL

## 2016-05-18 MED ORDER — HEPARIN SODIUM (PORCINE) 1000 UNIT/ML IJ SOLN
INTRAMUSCULAR | Status: DC | PRN
  Administered 2016-05-18: 3000 [IU] via INTRAVENOUS
  Administered 2016-05-18: 2000 [IU] via INTRAVENOUS
  Administered 2016-05-18: 5000 [IU] via INTRAVENOUS

## 2016-05-18 MED ORDER — HEPARIN (PORCINE) IN NACL 2-0.9 UNIT/ML-% IJ SOLN
INTRAMUSCULAR | Status: DC | PRN
  Administered 2016-05-18: 1000 mL

## 2016-05-18 MED ORDER — SODIUM CHLORIDE 0.9 % IV SOLN: 250.0000 mL | INTRAVENOUS | Status: DC | PRN

## 2016-05-18 MED ORDER — HEPARIN (PORCINE) IN NACL 2-0.9 UNIT/ML-% IJ SOLN
INTRAMUSCULAR | Status: AC
  Filled 2016-05-18: qty 500

## 2016-05-18 MED ORDER — FENTANYL CITRATE (PF) 100 MCG/2ML IJ SOLN
INTRAMUSCULAR | Status: AC
  Filled 2016-05-18: qty 2

## 2016-05-18 MED ORDER — FAMOTIDINE IN NACL 20-0.9 MG/50ML-% IV SOLN
INTRAVENOUS | Status: AC
  Filled 2016-05-18: qty 50

## 2016-05-18 MED ORDER — MIDAZOLAM HCL 2 MG/2ML IJ SOLN
INTRAMUSCULAR | Status: AC
Start: 2016-05-18 — End: 2016-05-18
  Filled 2016-05-18: qty 2

## 2016-05-18 MED ORDER — CLOPIDOGREL BISULFATE 300 MG PO TABS
ORAL_TABLET | ORAL | Status: AC
  Filled 2016-05-18: qty 1

## 2016-05-18 MED ORDER — SODIUM CHLORIDE 0.9% FLUSH
3.0000 mL | Freq: Two times a day (BID) | INTRAVENOUS | Status: DC
  Administered 2016-05-18: 18:00:00 3 mL via INTRAVENOUS

## 2016-05-18 MED ORDER — HEPARIN SODIUM (PORCINE) 1000 UNIT/ML IJ SOLN
INTRAMUSCULAR | Status: AC
  Filled 2016-05-18: qty 1

## 2016-05-18 MED ORDER — SODIUM CHLORIDE 0.9% FLUSH: 3.0000 mL | Freq: Two times a day (BID) | INTRAVENOUS | Status: DC

## 2016-05-18 MED ORDER — ENOXAPARIN SODIUM 40 MG/0.4ML ~~LOC~~ SOLN
40.0000 mg | SUBCUTANEOUS | Status: DC
  Administered 2016-05-19: 08:00:00 40 mg via SUBCUTANEOUS
  Filled 2016-05-18: qty 0.4

## 2016-05-18 MED ORDER — MELOXICAM 7.5 MG PO TABS
7.5000 mg | ORAL_TABLET | Freq: Every day | ORAL | Status: DC
  Administered 2016-05-19: 10:00:00 7.5 mg via ORAL
  Filled 2016-05-18: qty 1

## 2016-05-18 MED ORDER — LOSARTAN POTASSIUM 50 MG PO TABS
50.0000 mg | ORAL_TABLET | Freq: Every day | ORAL | Status: DC
  Administered 2016-05-18: 50 mg via ORAL
  Filled 2016-05-18 (×2): qty 1

## 2016-05-18 MED ORDER — ASPIRIN 81 MG PO CHEW
81.0000 mg | CHEWABLE_TABLET | ORAL | Status: AC
  Administered 2016-05-18: 81 mg via ORAL

## 2016-05-18 MED ORDER — LABETALOL HCL 5 MG/ML IV SOLN
INTRAVENOUS | Status: DC | PRN
  Administered 2016-05-18: 10 mg via INTRAVENOUS

## 2016-05-18 MED ORDER — ASPIRIN 81 MG PO CHEW
CHEWABLE_TABLET | ORAL | Status: AC
  Filled 2016-05-18: qty 1

## 2016-05-18 MED ORDER — FAMOTIDINE IN NACL 20-0.9 MG/50ML-% IV SOLN
INTRAVENOUS | Status: DC | PRN
  Administered 2016-05-18: 20 mg via INTRAVENOUS

## 2016-05-18 MED ORDER — LIDOCAINE HCL (PF) 1 % IJ SOLN
INTRAMUSCULAR | Status: DC | PRN
  Administered 2016-05-18: 18 mL

## 2016-05-18 MED ORDER — SODIUM CHLORIDE 0.9 % WEIGHT BASED INFUSION: 1.0000 mL/kg/h | INTRAVENOUS | Status: DC

## 2016-05-18 MED ORDER — LEVOTHYROXINE SODIUM 50 MCG PO TABS
50.0000 ug | ORAL_TABLET | Freq: Every day | ORAL | Status: DC
  Administered 2016-05-19: 50 ug via ORAL
  Filled 2016-05-18: qty 1

## 2016-05-18 MED ORDER — SODIUM CHLORIDE 0.9% FLUSH: 3.0000 mL | INTRAVENOUS | Status: DC | PRN

## 2016-05-18 MED ORDER — MIDAZOLAM HCL 2 MG/2ML IJ SOLN
INTRAMUSCULAR | Status: DC | PRN
  Administered 2016-05-18 (×2): 0.5 mg via INTRAVENOUS

## 2016-05-18 MED ORDER — CLOPIDOGREL BISULFATE 75 MG PO TABS
75.0000 mg | ORAL_TABLET | Freq: Every day | ORAL | Status: DC
  Administered 2016-05-19: 75 mg via ORAL
  Filled 2016-05-18: qty 1

## 2016-05-18 MED ORDER — AMLODIPINE BESYLATE 5 MG PO TABS
5.0000 mg | ORAL_TABLET | Freq: Every day | ORAL | Status: DC
  Administered 2016-05-18: 18:00:00 5 mg via ORAL
  Filled 2016-05-18 (×2): qty 1

## 2016-05-18 MED ORDER — NITROGLYCERIN 0.2 MG/ML ON CALL CATH LAB
INTRAVENOUS | Status: AC
  Filled 2016-05-18: qty 1

## 2016-05-18 MED ORDER — LABETALOL HCL 5 MG/ML IV SOLN
INTRAVENOUS | Status: AC
  Filled 2016-05-18: qty 4

## 2016-05-18 MED ORDER — IODIXANOL 320 MG/ML IV SOLN
INTRAVENOUS | Status: DC | PRN
  Administered 2016-05-18: 150 mL via INTRA_ARTERIAL

## 2016-05-18 MED ORDER — NITROGLYCERIN 1 MG/10 ML FOR IR/CATH LAB
INTRA_ARTERIAL | Status: DC | PRN
  Administered 2016-05-18: 200 ug via INTRA_ARTERIAL

## 2016-05-18 MED ORDER — ONDANSETRON HCL 4 MG/2ML IJ SOLN: 4.0000 mg | Freq: Four times a day (QID) | INTRAMUSCULAR | Status: DC | PRN

## 2016-05-18 MED ORDER — SODIUM CHLORIDE 0.9 % WEIGHT BASED INFUSION
3.0000 mL/kg/h | INTRAVENOUS | Status: DC
  Administered 2016-05-18: 3 mL/kg/h via INTRAVENOUS

## 2016-05-18 MED ORDER — CLOPIDOGREL BISULFATE 300 MG PO TABS
ORAL_TABLET | ORAL | Status: DC | PRN
  Administered 2016-05-18: 600 mg via ORAL

## 2016-05-18 MED ORDER — ACETAMINOPHEN 325 MG PO TABS
650.0000 mg | ORAL_TABLET | ORAL | Status: DC | PRN
  Administered 2016-05-18 – 2016-05-19 (×3): 650 mg via ORAL
  Filled 2016-05-18 (×3): qty 2

## 2016-05-18 MED ORDER — FENTANYL CITRATE (PF) 100 MCG/2ML IJ SOLN
25.0000 ug | INTRAMUSCULAR | Status: DC | PRN
  Administered 2016-05-18 (×2): 25 ug via INTRAVENOUS
  Filled 2016-05-18: qty 2

## 2016-05-18 MED ORDER — SODIUM CHLORIDE 0.9 % IV SOLN: INTRAVENOUS | Status: AC

## 2016-05-18 MED ORDER — FENTANYL CITRATE (PF) 100 MCG/2ML IJ SOLN
INTRAMUSCULAR | Status: DC | PRN
  Administered 2016-05-18 (×3): 25 ug via INTRAVENOUS

## 2016-05-18 MED ORDER — LIDOCAINE HCL (PF) 1 % IJ SOLN
INTRAMUSCULAR | Status: AC
  Filled 2016-05-18: qty 30

## 2016-05-18 MED ORDER — ASPIRIN EC 81 MG PO TBEC
81.0000 mg | DELAYED_RELEASE_TABLET | Freq: Every day | ORAL | Status: DC
  Administered 2016-05-19: 81 mg via ORAL
  Filled 2016-05-18: qty 1

## 2016-05-18 SURGICAL SUPPLY — 26 items
BALLN COYOTE OTW 4X220X150 (BALLOONS) ×2
BALLN LUTONIX 5X150X130 (BALLOONS) ×4
BALLN STERLING OTW 5X150X150 (BALLOONS) ×2
BALLN STERLING OTW 5X60X135 (BALLOONS) ×2
BALLOON COYOTE OTW 4X220X150 (BALLOONS) ×1 IMPLANT
BALLOON LUTONIX 5X150X130 (BALLOONS) ×2 IMPLANT
BALLOON STERLING OTW 5X150X150 (BALLOONS) ×1 IMPLANT
BALLOON STERLING OTW 5X60X135 (BALLOONS) ×1 IMPLANT
CATH ANGIO 5F PIGTAIL 65CM (CATHETERS) ×2 IMPLANT
CATH CROSS OVER TEMPO 5F (CATHETERS) ×2 IMPLANT
CATH CXI SUPP ST 4FR 135CM (MICROCATHETER) ×2 IMPLANT
CATH NAVICROSS ST .035X135CM (MICROCATHETER) ×2 IMPLANT
DEVICE CONTINUOUS FLUSH (MISCELLANEOUS) ×2 IMPLANT
DEVICE SPIDERFX EMB PROT 4MM (WIRE) ×2 IMPLANT
GUIDEWIRE ANGLED .035X260CM (WIRE) ×2 IMPLANT
KIT ENCORE 26 ADVANTAGE (KITS) ×2 IMPLANT
KIT PV (KITS) ×2 IMPLANT
SHEATH PINNACLE 5F 10CM (SHEATH) ×2 IMPLANT
SHEATH PINNACLE ST 6F 45CM (SHEATH) ×2 IMPLANT
STENT INNOVA 6X150X130 (Permanent Stent) ×2 IMPLANT
SYRINGE MEDRAD AVANTA MACH 7 (SYRINGE) ×2 IMPLANT
TAPE RADIOPAQUE TURBO (MISCELLANEOUS) ×2 IMPLANT
TRANSDUCER W/STOPCOCK (MISCELLANEOUS) ×2 IMPLANT
TRAY PV CATH (CUSTOM PROCEDURE TRAY) ×2 IMPLANT
TUBING CIL FLEX 10 FLL-RA (TUBING) ×2 IMPLANT
WIRE HITORQ VERSACORE ST 145CM (WIRE) ×2 IMPLANT

## 2016-05-18 NOTE — Progress Notes (Signed)
Site area: rt groin  Sheath pulled by Buck Mam Site Prior to Removal:  Level  0 Pressure Applied For:  25 minutes Manual:   yes Patient Status During Pull:  stable Post Pull Site:  Level 0 Post Pull Instructions Given:  yes Post Pull Pulses Present: yes Dressing Applied:  tegaderm Bedrest begins @  1550 Comments:

## 2016-05-18 NOTE — H&P (View-Only) (Signed)
Cardiology Office Note   Date:  05/09/2016   ID:  [name], DOB [birth year], MRN [id number]  PCP:  Lorelee Market, MD  Cardiologist:   [name], MD   Chief Complaint  Patient presents with  . New Patient (Initial Visit)    SOME LEG SWELLING. NO CP OR SOB.      History of Present Illness: [name] is a 82 y.o. female who Was referred by Dr. Dellia Nims for evaluation of peripheral arterial disease and nonhealing ulcer on the left foot. The patient has no previous cardiac history and no history of diabetes or tobacco use. She is known to have hypertension, hyperlipidemia and hypothyroidism. She developed 2 ulcers on the left foot about 10 months ago and these has not healed in spite of seeing multiple providers. She started going to the wound clinic about one month ago and there has been no significant improvement. The wounds are relatively small and superficial. She underwent noninvasive vascular evaluation in our office which showed an ABI of 0.55 on the right and 0.40 on the left. Duplex showed relatively long occlusion of the mid to distal left SFA with two-vessel runoff below the knee. She has significant pain in the left foot especially at night and usually she has to dangle her foot down in order to be able to sleep. This seems to be the biggest issue for her. Otherwise in spite of her age, she is relatively functional and lives by herself. She denies any cardiac symptoms.  Past Medical History:  Diagnosis Date  . Arthritis   . Cancer [facility])    mastectomy 30 yr ago  . Hypertension   . Hypothyroidism   . kidney calculi   . PONV (postoperative nausea and vomiting)   . Shortness of breath     Past Surgical History:  Procedure Laterality Date  . BREAST SURGERY    . CYSTOSCOPY W/ URETERAL STENT PLACEMENT    . CYSTOSCOPY W/ URETERAL STENT PLACEMENT  01/17/2012   Procedure: CYSTOSCOPY WITH RETROGRADE PYELOGRAM/URETERAL STENT PLACEMENT;  Surgeon: Franchot Gallo, MD;  Location: [facility];  Service: Urology;  Laterality: Left;  . EYE SURGERY       Current Outpatient Prescriptions  Medication Sig Dispense Refill  . amLODipine (NORVASC) 5 MG tablet Take 5 mg by mouth daily with breakfast.     . aspirin EC 81 MG tablet Take 81 mg by mouth daily.    Marland Kitchen azithromycin (ZITHROMAX) 250 MG tablet Take 250-500 mg by mouth daily. Take 500 mg by mouth on day 1, then take 250 mg by mouth on days 2-5    . cetirizine (ZYRTEC) 10 MG tablet Take 10 mg by mouth daily.    Marland Kitchen dextromethorphan (DELSYM) 30 MG/5ML liquid Take 60 mg by mouth as needed for cough.    . dextromethorphan-guaiFENesin (MUCINEX DM) 30-600 MG per 12 hr tablet Take 1 tablet by mouth at bedtime as needed for cough.    Marland Kitchen ibuprofen (ADVIL,MOTRIN) 200 MG tablet Take 400 mg by mouth at bedtime.    Marland Kitchen levothyroxine (SYNTHROID, LEVOTHROID) 50 MCG tablet Take 50 mcg by mouth daily with breakfast.     . losartan (COZAAR) 50 MG tablet Take 50 mg by mouth daily with breakfast.     . meloxicam (MOBIC) 7.5 MG tablet Take 7.5 mg by mouth daily as needed. Pain and inflammation     No current facility-administered medications for this visit.     Allergies:   Codeine  Social History:  The patient  reports that she has never smoked. She has never used smokeless tobacco. She reports that she does not drink alcohol or use drugs.   Family History:  The patient's Family history is remarkable for coronary artery disease.   ROS:  Please see the history of present illness.   Otherwise, review of systems are positive for none.   All other systems are reviewed and negative.    PHYSICAL EXAM: VS:  BP (!) 185/60 (BP Location: Left Arm, Patient Position: Sitting, Cuff Size: Normal)   Pulse 90   Ht 5\' 4"  (1.626 m)  , BMI There is no height or weight on file to calculate BMI. GEN: Well nourished, well developed, in no acute distress  HEENT: normal  Neck: no JVD, carotid bruits, or masses Cardiac: RRR; no  murmurs, rubs, or gallops,no edema  Respiratory:  clear to auscultation bilaterally, normal work of breathing GI: soft, nontender, nondistended, + BS MS: no deformity or atrophy  Skin: warm and dry, no rash Neuro:  Strength and sensation are intact Psych: euthymic mood, full affect Vascular: Femoral pulses are normal bilaterally. Distal pulses are not palpable. There is a small 3 x 3 mm superficial ulcer on the left small toe. There is a slightly beagle are sore at 4 x 4 millimeter superficial ulceration on the lateral ankle   EKG:  EKG is ordered today. The ekg ordered today demonstrates normal sinus rhythm with first-degree AV block. Incomplete right bundle branch block. Moderate LVH with possible old inferior infarct.  Recent Labs: No results found for requested labs within last 8760 hours.    Lipid Panel No results found for: CHOL, TRIG, HDL, CHOLHDL, VLDL, LDLCALC, LDLDIRECT    Wt Readings from Last 3 Encounters:  02/06/12 158 lb (71.7 kg)  01/18/12 160 lb 7.9 oz (72.8 kg)      Other studies Reviewed: Additional studies/ records that were reviewed today include: Office note from the wound center.    ASSESSMENT AND PLAN:  1.  Critical limb ischemia with nonhealing ulcer on the left foot (Rutherford class V). Severely reduced ABI of 0.4 with evidence of relatively long SFA occlusion. I had a prolonged discussion with the patient and her family about management options. Obviously, given her age, her vascular complications with angiography and endovascular intervention are much higher than the average. Also the occlusion in the SFA appears to be chronic and not exactly sure about the duration of that. Thus, the success rate with endovascular intervention in that area is variable and probably in the range of 60-70%. I don't think surgical revascularization with bypass is an option.   I discussed angiography and endovascular intervention in details. They are going to review with  other family members and let me know if they want to proceed. In the meantime, continue care at the wound center.  2. Essential hypertension: Blood pressure is elevated today. Continue treatment with losartan and amlodipine.  Disposition:   FU with me in 1 month  Signed,  [name], MD  05/09/2016 5:13 PM    [place]

## 2016-05-18 NOTE — Interval H&P Note (Signed)
History and Physical Interval Note:  05/18/2016 11:24 AM  [name]  has presented today for surgery, with the diagnosis of pad  The various methods of treatment have been discussed with the patient and family. After consideration of risks, benefits and other options for treatment, the patient has consented to  Procedure(s): Abdominal Aortogram w/Lower Extremity (N/A) as a surgical intervention .  The patient's history has been reviewed, patient examined, no change in status, stable for surgery.  I have reviewed the patient's chart and labs.  Questions were answered to the patient's satisfaction.     [name]

## 2016-05-19 ENCOUNTER — Encounter (HOSPITAL_COMMUNITY): Payer: Self-pay | Admitting: Cardiovascular Disease

## 2016-05-19 ENCOUNTER — Other Ambulatory Visit: Payer: Self-pay | Admitting: Cardiology

## 2016-05-19 DIAGNOSIS — I998 Other disorder of circulatory system: Secondary | ICD-10-CM

## 2016-05-19 DIAGNOSIS — I70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: Secondary | ICD-10-CM | POA: Diagnosis not present

## 2016-05-19 DIAGNOSIS — I119 Hypertensive heart disease without heart failure: Secondary | ICD-10-CM

## 2016-05-19 DIAGNOSIS — I739 Peripheral vascular disease, unspecified: Secondary | ICD-10-CM

## 2016-05-19 DIAGNOSIS — I70229 Atherosclerosis of native arteries of extremities with rest pain, unspecified extremity: Secondary | ICD-10-CM

## 2016-05-19 MED ORDER — CLOPIDOGREL BISULFATE 75 MG PO TABS: 75.0000 mg | ORAL_TABLET | Freq: Every day | ORAL | 5 refills | Status: DC

## 2016-05-19 MED ORDER — ANGIOPLASTY BOOK
Freq: Once | Status: DC
  Filled 2016-05-19: qty 1

## 2016-05-19 NOTE — Care Management Note (Signed)
Case Management Note  Patient Details  Name: [name] MRN: [id number] Date of Birth: 23-Sep-1919  Subjective/Objective:  Patient from home ,s/pelective LE angiogram, on plavix, per pt eval, no pt f/u needed.  Patient for dc today.                   Action/Plan:   Expected Discharge Date:                  Expected Discharge Plan:  Home/Self Care  In-House Referral:     Discharge planning Services  CM Consult  Post Acute Care Choice:    Choice offered to:     DME Arranged:    DME Agency:     HH Arranged:    HH Agency:     Status of Service:  Completed, signed off  If discussed at [employer] of Stay Meetings, dates discussed:    Additional Comments:  Zenon Mayo, RN 05/19/2016, 11:44 AM

## 2016-05-19 NOTE — Evaluation (Signed)
Physical Therapy Evaluation Patient Details Name: [name] MRN: [id number] DOB: 1919-07-30 Today's Date: 05/19/2016   History of Present Illness  Pt adm for elective LE angiogram. PMH - hypertension, hyperlipidemia and hypothyroidism, PAD  Clinical Impression  Pt doing well with mobility and no further PT needed.  Ready for dc from PT standpoint.      Follow Up Recommendations No PT follow up    Equipment Recommendations  None recommended by PT    Recommendations for Other Services       Precautions / Restrictions Precautions Precautions: Fall Restrictions Weight Bearing Restrictions: No      Mobility  Bed Mobility                  Transfers Overall transfer level: Needs assistance Equipment used: 4-wheeled walker Transfers: Sit to/from Stand Sit to Stand: Min guard         General transfer comment: for safety  Ambulation/Gait Ambulation/Gait assistance: Supervision Ambulation Distance (Feet): 125 Feet Assistive device: 4-wheeled walker Gait Pattern/deviations: Step-through pattern;Decreased stride length     General Gait Details: Steady gait with rollator  Stairs            Wheelchair Mobility    Modified Rankin (Stroke Patients Only)       Balance Overall balance assessment: Needs assistance Sitting-balance support: No upper extremity supported;Feet supported Sitting balance-Leahy Scale: Good     Standing balance support: No upper extremity supported Standing balance-Leahy Scale: Fair                               Pertinent Vitals/Pain Pain Assessment: Faces Faces Pain Scale: Hurts a little bit Pain Location: lt groin Pain Descriptors / Indicators: Sore Pain Intervention(s): Limited activity within patient's tolerance    Home Living Family/patient expects to be discharged to:: Private residence Living Arrangements: Alone Available Help at Discharge: Family;Available 24 hours/day Type of Home:  House Home Access: Level entry     Home Layout: One level Home Equipment: Walker - 4 wheels      Prior Function Level of Independence: Independent with assistive device(s)               Hand Dominance        Extremity/Trunk Assessment   Upper Extremity Assessment: Overall WFL for tasks assessed           Lower Extremity Assessment: Overall WFL for tasks assessed         Communication   Communication: HOH  Cognition Arousal/Alertness: Awake/alert Behavior During Therapy: WFL for tasks assessed/performed Overall Cognitive Status: Within Functional Limits for tasks assessed                      General Comments      Exercises        Assessment/Plan    PT Assessment Patent does not need any further PT services  PT Diagnosis Difficulty walking   PT Problem List    PT Treatment Interventions     PT Goals (Current goals can be found in the Care Plan section) Acute Rehab PT Goals PT Goal Formulation: All assessment and education complete, DC therapy    Frequency     Barriers to discharge        Co-evaluation               End of Session   Activity Tolerance: Patient tolerated treatment well Patient left: in chair;with  family/visitor present Nurse Communication: Mobility status    Functional Assessment Tool Used: clincal judgement Functional Limitation: Mobility: Walking and moving around Mobility: Walking and Moving Around Current Status [phone]): At least 1 percent but less than 20 percent impaired, limited or restricted Mobility: Walking and Moving Around Goal Status [phone]): At least 1 percent but less than 20 percent impaired, limited or restricted Mobility: Walking and Moving Around Discharge Status [phone]): At least 1 percent but less than 20 percent impaired, limited or restricted    Time: 1117-1125 PT Time Calculation (min) (ACUTE ONLY): 8 min   Charges:   PT Evaluation $PT Eval Low Complexity: 1 Procedure     PT G  Codes:   PT G-Codes **NOT FOR INPATIENT CLASS** Functional Assessment Tool Used: clincal judgement Functional Limitation: Mobility: Walking and moving around Mobility: Walking and Moving Around Current Status [id number]): At least 1 percent but less than 20 percent impaired, limited or restricted Mobility: Walking and Moving Around Goal Status [phone]): At least 1 percent but less than 20 percent impaired, limited or restricted Mobility: Walking and Moving Around Discharge Status [phone]): At least 1 percent but less than 20 percent impaired, limited or restricted    [facility] 05/19/2016, 11:41 AM [facility] PT [phone]

## 2016-05-19 NOTE — Progress Notes (Signed)
Patient Profile: 82 y.o. [name] who was referred by Dr. Dellia Nims, to Dr. Fletcher Anon, for evaluation of peripheral arterial disease and a nonhealing ulcer on the left foot. The patient has no previous cardiac history and no history of diabetes or tobacco use. She is known to have hypertension, hyperlipidemia and hypothyroidism. She underwent noninvasive vascular evaluation in our office which showed an ABI of 0.55 on the right and 0.40 on the left. Duplex showed relatively long occlusion of the mid to distal left SFA with two-vessel runoff below the knee.  Subsequently, she was referred for elective LE angiogram.   Subjective: No groin pain. She still has left lower extremity pain, swelling and redness. Tender to touch.   Objective: Vital signs in last 24 hours: Temp:  [97.9 F (36.6 C)-99.3 F (37.4 C)] 98.3 F (36.8 C) (08/10 0803) Pulse Rate:  [0-295] 95 (08/10 0803) Resp:  [0-50] 17 (08/10 0605) BP: (123-198)/(45-98) 154/51 (08/10 0803) SpO2:  [0 %-100 %] 95 % (08/10 0803) Weight:  [155 lb 6.8 oz (70.5 kg)-160 lb (72.6 kg)] 155 lb 6.8 oz (70.5 kg) (08/10 0605)    Intake/Output from previous day: 08/09 0701 - 08/10 0700 In: 120 [P.O.:120] Out: 400 [Urine:400] Intake/Output this shift: Total I/O In: 120 [P.O.:120] Out: -   Medications Current Facility-Administered Medications  Medication Dose Route Frequency Provider Last Rate Last Dose  . 0.9 %  sodium chloride infusion  250 mL Intravenous PRN Wellington Hampshire, MD      . acetaminophen (TYLENOL) tablet 650 mg  650 mg Oral Q4H PRN Wellington Hampshire, MD   650 mg at 05/19/16 [id number]  . amLODipine (NORVASC) tablet 5 mg  5 mg Oral Q breakfast Wellington Hampshire, MD   5 mg at 05/18/16 1751  . angioplasty book   Does not apply Once Wellington Hampshire, MD      . aspirin EC tablet 81 mg  81 mg Oral Daily Wellington Hampshire, MD      . clopidogrel (PLAVIX) tablet 75 mg  75 mg Oral Q breakfast Wellington Hampshire, MD   75 mg at 05/19/16 0805  .  enoxaparin (LOVENOX) injection 40 mg  40 mg Subcutaneous Q24H Wellington Hampshire, MD   40 mg at 05/19/16 0804  . fentaNYL (SUBLIMAZE) injection 25 mcg  25 mcg Intravenous Q2H PRN Wellington Hampshire, MD   25 mcg at 05/18/16 2018  . levothyroxine (SYNTHROID, LEVOTHROID) tablet 50 mcg  50 mcg Oral QAC breakfast Wellington Hampshire, MD   50 mcg at 05/19/16 0805  . losartan (COZAAR) tablet 50 mg  50 mg Oral Q breakfast Wellington Hampshire, MD   50 mg at 05/18/16 1752  . meloxicam (MOBIC) tablet 7.5 mg  7.5 mg Oral Daily Wellington Hampshire, MD      . ondansetron [facility]) injection 4 mg  4 mg Intravenous Q6H PRN Wellington Hampshire, MD      . sodium chloride flush (NS) 0.9 % injection 3 mL  3 mL Intravenous Q12H Wellington Hampshire, MD   3 mL at 05/18/16 1753  . sodium chloride flush (NS) 0.9 % injection 3 mL  3 mL Intravenous PRN Wellington Hampshire, MD        PE: General appearance: alert, cooperative, no distress and elderly Neck: no carotid bruit and no JVD Lungs: clear to auscultation bilaterally Heart: regular rate and rhythm, S1, S2 normal, no murmur, click, rub or gallop Extremities: bilateral LEE, L>R 1+ pitting,  mild erythema on the left with increased warmth compared to the contralateral side Pulses: 2+ and symmetric Skin: warm and dry Neurologic: Grossly normal  Lab Results:   Recent Labs  05/16/16 0955 05/18/16 1647  WBC 7.7 8.8  HGB 13.4 12.6  HCT 38.0 39.3  PLT 297 273   BMET  Recent Labs  05/16/16 0955 05/18/16 1647  NA 142  --   K 3.5  --   CL 110  --   CO2 27  --   GLUCOSE 128*  --   BUN 17  --   CREATININE 0.69 0.63  CALCIUM 9.2  --    PT/INR  Recent Labs  05/16/16 0955  LABPROT 13.1  INR 0.99    Studies/Results: Procedures   Abdominal Aortogram w/Lower Extremity  Conclusion   1. No significant aortoiliac disease. 2. Long occlusion of the left SFA with 1 vessel runoff below the knee via the peroneal artery which is occluded distally but gives collaterals to the  dorsalis pedis. 3. Significant diffuse disease affecting the right SFA with 1 vessel runoff below the knee. 4. Successful complex angioplasty of the left SFA followed by drug-coated balloon angioplasty and spot stenting with self-expanding stent for spiral dissection in the proximal to midsegment.  Recommendations: Dual antiplatelet therapy for at least one month. The patient should have enough flow to heal the left foot ulcers. Angioplasty in the distal left peroneal artery should be reserved if healing is not complete.     Assessment/Plan  Active Problems:   Critical lower limb ischemia   PVD (peripheral vascular disease) with claudication ([place])  1. Critical Limb Ischemia/ PVD: Day 1 s/p PV angiogram of bilateral LEs. Angiographic detailed outlined above. Successful complex angioplasty of the left SFA followed by drug-coated balloon angioplasty and spot stenting with self-expanding stent for spiral dissection in the proximal to midsegment. Recommend Dual antiplatelet therapy for at least one month. The patient should have enough flow to heal the left foot ulcers. Angioplasty in the distal left peroneal artery should be reserved if healing is not complete. Continue ASA + Plavix. Ambulate with cardiac rehab prior to discharge. F/u left LE arterial doppler + ABI in 1 weeks + office w/u with Dr. Fletcher Anon.   -- Patient still complains of left leg pain/ tenderness with light touch. Physical exam reveals bilateral LEE, L>R 1+ pitting, mild erythema on the left anterior aspect with increased warmth compared to the contralateral side. ? Cellulitis. She is afebrile. WBC is normal. Will ask MD to assess and advise.   Dispo: d/c home later today. F/u left LE arterial doppler + ABIs in 1 week + office f/u with Dr. Fletcher Anon.    LOS: 0 days   Brittainy M. Ladoris Gene 05/19/2016 8:14 AM  The patient was seen, examined and discussed with Brittainy M. Rosita Fire, PA-C and I agree with the above.   82 year old  admitted with critical limb ischemia, s/p successful complex angioplasty of the left SFA followed by drug-coated balloon angioplasty and spot stenting with self-expanding stent for spiral dissection in the proximal to midsegment. Recommend Dual antiplatelet therapy for at least one month. The patient should have enough flow to heal the left foot ulcers. Angioplasty in the distal left peroneal artery should be reserved if healing is not complete. Continue ASA + Plavix.  We will schedule PT and discharge home later today if she does well.  F/u left LE arterial doppler + ABI in 1 weeks + office w/u with Dr. Fletcher Anon.  BP elevated yesterday, improved with pain control, in 140-160 range today and that is appropriate for 66 year old. We will reevaluate at the office visit and adjust BP meds if necessary.  Hb 12.6, Crea 0.6.  Ena Dawley 05/19/2016

## 2016-05-19 NOTE — Progress Notes (Signed)
   05/19/16 0900  Clinical Encounter Type  Visited With Patient and family together  Visit Type Spiritual support  Referral From Nurse  Consult/Referral To Chaplain  Spiritual Encounters  Spiritual Needs Prayer;Emotional  Stress Factors  Patient Stress Factors Health changes  Family Stress Factors Health changes  Chaplain consult visit made, patient reports discomfort but in good spirits, daughters at bedside, provided spiritual presence, prayer, active listening and emotional support. Chaplain will be available for further support as needed.

## 2016-05-19 NOTE — Progress Notes (Signed)
Pt and family given dischg instructions and verbalized understanding.  Pt's family is aware of plavix to be picked up at [employer].  All belongimgs with pt and family.  No c/o pain at this moment.  Transport via wheelchair to home with daughter.

## 2016-05-19 NOTE — Discharge Summary (Signed)
Discharge Summary    Patient ID: [name],  MRN: [id number], DOB/AGE: 1919-04-02 82 y.o.  Admit date: 05/18/2016 Discharge date: 05/19/2016  Primary Care Provider: [facility] Primary Cardiologist: Dr. Fletcher Anon  Discharge Diagnoses    Active Problems:   Critical lower limb ischemia   PVD (peripheral vascular disease) with claudication (HCC)   Allergies Allergies  Allergen Reactions  . Codeine Nausea And Vomiting    Diagnostic Studies/Procedures    Procedures   Abdominal Aortogram w/Lower Extremity  Conclusion   1. No significant aortoiliac disease. 2. Long occlusion of the left SFA with 1 vessel runoff below the knee via the peroneal artery which is occluded distally but gives collaterals to the dorsalis pedis. 3. Significant diffuse disease affecting the right SFA with 1 vessel runoff below the knee. 4. Successful complex angioplasty of the left SFA followed by drug-coated balloon angioplasty and spot stenting with self-expanding stent for spiral dissection in the proximal to midsegment.  Recommendations: Dual antiplatelet therapy for at least one month. The patient should have enough flow to heal the left foot ulcers. Angioplasty in the distal left peroneal artery should be reserved if healing is not complete.    History of Present Illness     82 y.o.femalewho was referred by Dr. Dellia Nims, to Dr. Sharlet Salina evaluation of peripheral arterial disease and a nonhealing ulcer on the left foot. The patient has no previous cardiac history and no history of diabetes or tobacco use. She is known to have hypertension, hyperlipidemia and hypothyroidism. She underwent noninvasive vascular evaluation in our office which showed an ABI of 0.55 on the right and 0.40 on the left. Duplex showed relatively long occlusion of the mid to distal left SFA with two-vessel runoff below the knee.  Subsequently, she was referred for elective LE angiogram.   Hospital Course      Patient underwent PV angiogram of bilateral LEs 05/18/16, per Dr. Fletcher Anon. Angiographic detailed outlined above. She underwent successful complex angioplasty of the left SFA followed by drug-coated balloon angioplasty and spot stenting with self-expanding stent for spiral dissection in the proximal to midsegment. She tolerated the procedure well and left the cath lab in stable condition. She was placed on DAPT with ASA + Plavix. Dr. Fletcher Anon recommends dual antiplatelet therapy for at least one month. He feels that the patient should have enough flow to heal the left foot ulcers. Angioplasty in the distal left peroneal artery should be reserved if healing is not complete.   She was monitored overnight and had no post cath complications. Her femoral access site remained stable as well as renal function and vital signs. She ambulated with cardiac rehab w/o difficulty. She was last seen and examined by Dr. Meda Coffee who determined she was stable for d/c home. She will have f/u left LE arterial doppler + ABI in 1 week + office w/u with Dr. Fletcher Anon. Order for ultrasound + appointment request made.    Consultants: none    Discharge Vitals Blood pressure (!) 154/51, pulse 95, temperature 98.3 F (36.8 C), temperature source Oral, resp. rate 17, height 5\' 4"  (1.626 m), weight 155 lb 6.8 oz (70.5 kg), SpO2 95 %.  Filed Weights   05/18/16 0822 05/19/16 0605  Weight: 160 lb (72.6 kg) 155 lb 6.8 oz (70.5 kg)    Labs & Radiologic Studies    CBC  Recent Labs  05/18/16 1647  WBC 8.8  HGB 12.6  HCT 39.3  MCV 93.1  PLT 273   Basic  Metabolic Panel  Recent Labs  05/18/16 1647  CREATININE 0.63   Liver Function Tests No results for input(s): AST, ALT, ALKPHOS, BILITOT, PROT, ALBUMIN in the last 72 hours. No results for input(s): LIPASE, AMYLASE in the last 72 hours. Cardiac Enzymes No results for input(s): CKTOTAL, CKMB, CKMBINDEX, TROPONINI in the last 72 hours. BNP Invalid input(s): POCBNP D-Dimer No  results for input(s): DDIMER in the last 72 hours. Hemoglobin A1C No results for input(s): HGBA1C in the last 72 hours. Fasting Lipid Panel No results for input(s): CHOL, HDL, LDLCALC, TRIG, CHOLHDL, LDLDIRECT in the last 72 hours. Thyroid Function Tests No results for input(s): TSH, T4TOTAL, T3FREE, THYROIDAB in the last 72 hours.  Invalid input(s): FREET3 _____________  No results found. Disposition   Pt is being discharged home today in good condition.  Follow-up Plans & Appointments    Follow-up Information    [facility] [address] .   Specialty:  Cardiology Why:  Our office will call you with an appointment for repeat ultrasound of your leg in 1 week. They will also arrange f/u with Dr. Fletcher Anon.  Contact information: [address] [place] [id number] [place] [phone]         Discharge Instructions    Call MD for:  severe uncontrolled pain    Complete by:  As directed   Call MD for:  temperature >100.4    Complete by:  As directed   Diet - low sodium heart healthy    Complete by:  As directed   Increase activity slowly    Complete by:  As directed      Discharge Medications   Current Discharge Medication List    START taking these medications   Details  clopidogrel (PLAVIX) 75 MG tablet Take 1 tablet (75 mg total) by mouth daily with breakfast. Qty: 30 tablet, Refills: 5      CONTINUE these medications which have NOT CHANGED   Details  amLODipine (NORVASC) 5 MG tablet Take 5 mg by mouth daily with breakfast.     aspirin EC 81 MG tablet Take 81 mg by mouth daily.    cetirizine (ZYRTEC) 10 MG tablet Take 10 mg by mouth daily.    levothyroxine (SYNTHROID, LEVOTHROID) 50 MCG tablet Take 50 mcg by mouth daily with breakfast.     lidocaine-prilocaine (EMLA) cream Apply 1 application topically as needed. Dressing change    losartan (COZAAR) 50 MG tablet Take 50 mg by mouth daily with breakfast.         STOP taking these medications     ibuprofen (ADVIL,MOTRIN) 200 MG tablet      meloxicam (MOBIC) 7.5 MG tablet          Outstanding Labs/Studies   F/u left lower extremity arterial doppler + ABIs in 1 week.   Duration of Discharge Encounter   Greater than 30 minutes including physician time.  Signed, Lyda Jester PA-C 05/19/2016, 11:35 AM

## 2016-05-23 ENCOUNTER — Other Ambulatory Visit: Payer: Self-pay | Admitting: Cardiovascular Disease

## 2016-05-23 ENCOUNTER — Telehealth: Payer: Self-pay | Admitting: Cardiovascular Disease

## 2016-05-23 DIAGNOSIS — I998 Other disorder of circulatory system: Secondary | ICD-10-CM

## 2016-05-23 DIAGNOSIS — I70229 Atherosclerosis of native arteries of extremities with rest pain, unspecified extremity: Secondary | ICD-10-CM

## 2016-05-23 DIAGNOSIS — I739 Peripheral vascular disease, unspecified: Secondary | ICD-10-CM

## 2016-05-23 NOTE — Telephone Encounter (Signed)
Reviewed [name]'s recommendations w/pt daughter, [name], who verbalized understanding. She will check pedal pulses and notify if absent or signs of discoloration.  Confirmed 8/21 LE arterial appt.

## 2016-05-23 NOTE — Telephone Encounter (Signed)
S/w pt daughter, [name], who reports pt has bilateral leg tingling since abdominal aortogram Aug 9. Pt describes sx as "it feels like needles". Daughter Arbie Cookey spent night last evening and gave pt two extra strength Tylenol w/no relief. Pt was up all night.  She has ulcers on her feet therefore, daughter is unsure if pain is r/t ulcers or procedure. [name] has not noticed discoloration. Has not checked for pulses. She asks if they may give pt Tylenol PM at bedtime. Pt is 82 years old and family would like approval before adding OTC medications.  She has LE arterial scheduled on 8/21.  As Dr. Fletcher Anon is out of the office, I will forward to Christell Faith, PA-C

## 2016-05-23 NOTE — Telephone Encounter (Signed)
Pt daughter calling stating pt had procedure last week  Pt is still having some leg pains and was told to call us if it still was going on Please advise. Was also advised to scheduled another procedure

## 2016-05-23 NOTE — Telephone Encounter (Signed)
Patient was symptomatic prior to her procedure. Likely 2/2 PAD/ulcers. Recommend pulse check. Ok for Tylenol PM. Watch for any discoloration.

## 2016-05-24 ENCOUNTER — Ambulatory Visit: Payer: Medicare PPO | Admitting: Surgery

## 2016-05-25 ENCOUNTER — Telehealth: Payer: Self-pay | Admitting: Cardiovascular Disease

## 2016-05-25 NOTE — Telephone Encounter (Signed)
Pt daughter called, states pt has a "pocket of fluid" that has collected in her calf on her Left leg. States pt is also having pain. She would like to know if this is normal. States pt had stent placed  In upper thigh last week. Please call.

## 2016-05-25 NOTE — Telephone Encounter (Signed)
S/w pt daughter, [name], who reports pt continues to complain of leg pain. There is a "pocket of fluid" on her left leg which she feels could be r/t  lidocaine cream pt used before surgery. Pharmacist told them the cream may have irritated or burned her leg. Pt also uses an electric blanket as she is always cold. Advised daughter to be cautious when using electric blanket as it may be too hot and to not put blanket directly on her skin.  [name] has a nurse friend who visited pt today and checked pt's pedal pulses and wound. Nurse advised she felt everything was w/in pt's usual state of condition.  Advised daughter to continue to monitor sx and keep upcoming appt for LE arterial and wound care appt.  She verbalized understanding and is agreeable w/plan. Will continue to treat pain w/Tylenol.Marland Kitchen

## 2016-05-27 ENCOUNTER — Telehealth: Payer: Self-pay | Admitting: Cardiovascular Disease

## 2016-05-27 NOTE — Telephone Encounter (Signed)
Pt daughter calling stating pt has a red has on upper torso. Noticed it yesterday afternoon Pt has stating she is having pains in her legs and we have already talked about that she states.  Would like to know what to about this.

## 2016-05-27 NOTE — Telephone Encounter (Signed)
Patients daughter called in stating that her mother had a rash on her torso front, back, and legs. She wasn't sure if it was a reaction to something. She did report that her mother uses a heating blanket. Let her know that she should possibly have this evaluated by her primary care provider to assess and make sure something else is not going on. She verbalized understanding of our conversation, agreement with plan, and had no further questions at this time. Let her know to call back if she has any further concerns.

## 2016-05-30 ENCOUNTER — Ambulatory Visit (HOSPITAL_COMMUNITY)
Admission: RE | Admit: 2016-05-30 | Discharge: 2016-05-30 | Disposition: A | Discharge status: Home / Self Care | Payer: Medicare PPO | Source: Ambulatory Visit | Attending: Cardiology | Admitting: Cardiology

## 2016-05-30 DIAGNOSIS — I10 Essential (primary) hypertension: Secondary | ICD-10-CM | POA: Insufficient documentation

## 2016-05-30 DIAGNOSIS — I70203 Unspecified atherosclerosis of native arteries of extremities, bilateral legs: Secondary | ICD-10-CM | POA: Diagnosis not present

## 2016-05-30 DIAGNOSIS — I739 Peripheral vascular disease, unspecified: Secondary | ICD-10-CM | POA: Diagnosis not present

## 2016-05-30 DIAGNOSIS — I998 Other disorder of circulatory system: Secondary | ICD-10-CM

## 2016-05-30 DIAGNOSIS — R938 Abnormal findings on diagnostic imaging of other specified body structures: Secondary | ICD-10-CM | POA: Insufficient documentation

## 2016-05-30 DIAGNOSIS — E039 Hypothyroidism, unspecified: Secondary | ICD-10-CM | POA: Diagnosis not present

## 2016-05-30 DIAGNOSIS — I70229 Atherosclerosis of native arteries of extremities with rest pain, unspecified extremity: Secondary | ICD-10-CM

## 2016-05-31 ENCOUNTER — Encounter: Payer: Medicare PPO | Admitting: Surgery

## 2016-05-31 DIAGNOSIS — I70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: Secondary | ICD-10-CM | POA: Diagnosis not present

## 2016-06-01 NOTE — Progress Notes (Signed)
AVRI, GRAEBER ([id number]) Visit Report for 05/31/2016 Arrival Information Details Patient Name: [name], [name] Date of Service: 05/31/2016 10:45 AM Medical Record Patient Account Number: [id number] Number: Treating RN: Carolyne Fiscal, Debi 23-Jun-1919 (82 y.o. Other Clinician: Date of Birth/Sex: Female) Treating ROBSON, [place] Primary Care Physician: Lorelee Market Physician/Extender: G Referring Physician: Vinson Moselle in Treatment: 7 Visit Information History Since Last Visit All ordered tests and consults were completed: No Patient Arrived: Ambulatory Added or deleted any medications: No Arrival Time: 10:44 Any new allergies or adverse reactions: No Accompanied By: daughter Had a fall or experienced change in No Transfer Assistance: None activities of daily living that may affect Patient Identification Verified: Yes risk of falls: Secondary Verification Process Yes Signs or symptoms of abuse/neglect since last No Completed: visito Patient Requires Transmission- No Hospitalized since last visit: No Based Precautions: Pain Present Now: No Patient Has Alerts: Yes Patient Alerts: 7/31 ABI: R-0.55 L-0.40 [facility] Electronic Signature(s) Signed: 05/31/2016 3:40:23 PM By: Alric Quan Entered By: Alric Quan on 05/31/2016 10:46:15 [name] ([id number]) -------------------------------------------------------------------------------- Encounter Discharge Information Details Patient Name: [name] Date of Service: 05/31/2016 10:45 AM Medical Record Patient Account Number: [id number] Number: Treating RN: Carolyne Fiscal, Debi 1918/12/29 (82 y.o. Other Clinician: Date of Birth/Sex: Female) Treating ROBSON, [place] Primary Care Physician: Lorelee Market Physician/Extender: G Referring Physician: Vinson Moselle in Treatment: 7 Encounter Discharge Information Items Discharge Pain Level:  0 Discharge Condition: Stable Ambulatory Status: Ambulatory Discharge Destination: Home Transportation: Private Auto Accompanied By: daughter Schedule Follow-up Appointment: Yes Medication Reconciliation completed and provided to Patient/Care Yes Roshana Shuffield: Provided on Clinical Summary of Care: 05/31/2016 Form Type Recipient Paper Patient [facility] Electronic Signature(s) Signed: 05/31/2016 11:54:48 AM By: Ruthine Dose Entered By: Ruthine Dose on 05/31/2016 11:54:48 [name] ([id number]) -------------------------------------------------------------------------------- Lower Extremity Assessment Details Patient Name: [name] Date of Service: 05/31/2016 10:45 AM Medical Record Patient Account Number: [id number] Number: Treating RN: Ahmed Prima [birth year] (82 y.o. Other Clinician: Date of Birth/Sex: Female) Treating ROBSON, [place] Primary Care Physician: Lorelee Market Physician/Extender: G Referring Physician: Lorelee Market Weeks in Treatment: 7 Vascular Assessment Pulses: Posterior Tibial Dorsalis Pedis Palpable: [Left:Yes] Extremity colors, hair growth, and conditions: Extremity Color: [Left:Red] Temperature of Extremity: [Left:Warm] Capillary Refill: [Left:< 3 seconds] Toe Nail Assessment Left: Right: Thick: No Discolored: No Deformed: No Improper Length and Hygiene: No Electronic Signature(s) Signed: 05/31/2016 3:40:23 PM By: Alric Quan Entered By: Alric Quan on 05/31/2016 13:57:40 [name] ([id number]) -------------------------------------------------------------------------------- Multi Wound Chart Details Patient Name: [name] Date of Service: 05/31/2016 10:45 AM Medical Record Patient Account Number: [id number] Number: Treating RN: Ahmed Prima 11/29/1918 (82 y.o. Other Clinician: Date of Birth/Sex: Female) Treating ROBSON, [place] Primary Care Physician: Lorelee Market Physician/Extender: G Referring Physician: Lorelee Market Weeks in Treatment: 7 Vital Signs Height(in): 64 Pulse(bpm): 87 Weight(lbs): 158 Blood Pressure 162/48 (mmHg): Body Mass Index(BMI): 27 Temperature(F): 97.8 Respiratory Rate 17 (breaths/min): Photos: [1:No Photos] [2:No Photos] [N/A:N/A] Wound Location: [1:Left Malleolus] [2:Left Toe Fifth - Lateral] [N/A:N/A] Wounding Event: [1:Gradually Appeared] [2:Gradually Appeared] [N/A:N/A] Primary Etiology: [1:Venous Leg Ulcer] [2:Arterial Insufficiency Ulcer N/A] Comorbid History: [1:Cataracts, Hypertension, Osteoarthritis] [2:Cataracts, Hypertension, N/A Osteoarthritis] Date Acquired: [1:04/07/2015] [2:04/07/2015] [N/A:N/A] Weeks of Treatment: [1:7] [2:7] [N/A:N/A] Wound Status: [1:Open] [2:Open] [N/A:N/A] Measurements L x W x D 0.7x0.5x0.2 [2:0.3x0.5x0.2] [N/A:N/A] (cm) Area (cm) : [1:0.275] [2:0.118] [N/A:N/A] Volume (cm) : [1:0.055] [2:0.024] [N/A:N/A] % Reduction in Area: [1:28.60%] [2:-151.10%] [N/A:N/A] % Reduction in Volume: 28.60% [2:-166.70%] [N/A:N/A] Classification: [1:Partial Thickness] [2:Partial Thickness] [N/A:N/A] Exudate Amount: [1:Large] [2:Large] [N/A:N/A]  Exudate Type: [1:Serous] [2:Serous] [N/A:N/A] Exudate Color: [1:amber] [2:amber] [N/A:N/A] Wound Margin: [1:Thickened] [2:Thickened] [N/A:N/A] Granulation Amount: [1:None Present (0%)] [2:None Present (0%)] [N/A:N/A] Necrotic Amount: [1:Large (67-100%)] [2:Large (67-100%)] [N/A:N/A] Exposed Structures: [1:Fascia: No Fat: No Tendon: No Muscle: No Joint: No Bone: No] [2:Fascia: No Fat: No Tendon: No Muscle: No Joint: No Bone: No] [N/A:N/A] Limited to Skin Limited to Skin Breakdown Breakdown Epithelialization: None None N/A Periwound Skin Texture: Edema: Yes Edema: Yes N/A Periwound Skin No Abnormalities Noted No Abnormalities Noted N/A Moisture: Periwound Skin Color: Erythema: Yes Erythema: Yes N/A Erythema Location: Circumferential  Circumferential N/A Temperature: No Abnormality No Abnormality N/A Tenderness on Yes Yes N/A Palpation: Wound Preparation: Ulcer Cleansing: Ulcer Cleansing: N/A Rinsed/Irrigated with Rinsed/Irrigated with Saline Saline Topical Anesthetic Topical Anesthetic Applied: Other: lidocaine Applied: Other: lidocaine 4% 4% Treatment Notes Electronic Signature(s) Signed: 05/31/2016 3:40:23 PM By: Alejandro MullingPinkerton, Debra Entered By: Alejandro MullingPinkerton, Debra on 05/31/2016 10:53:18 Everett GraffHRISMON, Aliviya ([id number]) -------------------------------------------------------------------------------- Multi-Disciplinary Care Plan Details Patient Name: Everett GraffHRISMON, Janiah Date of Service: 05/31/2016 10:45 AM Medical Record Patient Account Number: [id number] Number: Treating RN: [name] Haggisinkerton, Debi Jun 12, 1919 (82 y.o. Other Clinician: Date of Birth/Sex: Female) Treating ROBSON, MICHAEL Primary Care Physician: Evelene CroonNiemeyer, Meindert Physician/Extender: G Referring Physician: Bard HerbertNiemeyer, Meindert Weeks in Treatment: 7 Active Inactive Abuse / Safety / Falls / Self Care Management Nursing Diagnoses: Potential for falls Goals: Patient will remain injury free Date Initiated: 04/06/2016 Goal Status: Active Interventions: Assess fall risk on admission and as needed Notes: Nutrition Nursing Diagnoses: Imbalanced nutrition Goals: Patient/caregiver agrees to and verbalizes understanding of need to use nutritional supplements and/or vitamins as prescribed Date Initiated: 04/06/2016 Goal Status: Active Interventions: Assess patient nutrition upon admission and as needed per policy Notes: Orientation to the Wound Care Program Nursing Diagnoses: Knowledge deficit related to the wound healing center program Everett GraffCHRISMON, Catheleen ([id number]) Goals: Patient/caregiver will verbalize understanding of the Wound Healing Center Program Date Initiated: 04/06/2016 Goal Status: Active Interventions: Provide education on orientation to  the wound center Notes: Pain, Acute or Chronic Nursing Diagnoses: Pain, acute or chronic: actual or potential Potential alteration in comfort, pain Goals: Patient will verbalize adequate pain control and receive pain control interventions during procedures as needed Date Initiated: 04/06/2016 Goal Status: Active Interventions: Assess comfort goal upon admission Complete pain assessment as per visit requirements Notes: Wound/Skin Impairment Nursing Diagnoses: Impaired tissue integrity Goals: Ulcer/skin breakdown will have a volume reduction of 30% by week 4 Date Initiated: 04/06/2016 Goal Status: Active Ulcer/skin breakdown will have a volume reduction of 50% by week 8 Date Initiated: 04/06/2016 Goal Status: Active Ulcer/skin breakdown will have a volume reduction of 80% by week 12 Date Initiated: 04/06/2016 Goal Status: Active Interventions: Assess patient/caregiver ability to obtain necessary supplies CussetaHRISMON, Hend ([id number]) Assess ulceration(s) every visit Notes: Electronic Signature(s) Signed: 05/31/2016 3:40:23 PM By: Alejandro MullingPinkerton, Debra Entered By: Alejandro MullingPinkerton, Debra on 05/31/2016 10:53:12 Everett GraffHRISMON, Yelitza ([id number]) -------------------------------------------------------------------------------- Pain Assessment Details Patient Name: Everett GraffHRISMON, Kristyanna Date of Service: 05/31/2016 10:45 AM Medical Record Patient Account Number: [id number] Number: Treating RN: [name] Haggisinkerton, Debi Jun 12, 1919 (82 y.o. Other Clinician: Date of Birth/Sex: Female) Treating ROBSON, MICHAEL Primary Care Physician: Evelene CroonNiemeyer, Meindert Physician/Extender: G Referring Physician: Evelene CroonNiemeyer, Meindert Weeks in Treatment: 7 Active Problems Location of Pain Severity and Description of Pain Patient Has Paino No Site Locations With Dressing Change: No Pain Management and Medication Current Pain Management: Electronic Signature(s) Signed: 05/31/2016 3:40:23 PM By: Alejandro MullingPinkerton, Debra Entered By:  Alejandro MullingPinkerton, Debra on 05/31/2016 10:46:21 Everett GraffHRISMON, Giannie ([id number]) -------------------------------------------------------------------------------- Patient/Caregiver Education Details Patient Name: Everett GraffHRISMON, Rosaura Date  of Service: 05/31/2016 10:45 AM Medical Record Patient Account Number: [id number] Number: Treating RN: Ahmed Prima [birth year] (82 y.o. Other Clinician: Date of Birth/Gender: Female) Treating ROBSON, [place] Primary Care Physician: Lorelee Market Physician/Extender: G Referring Physician: Vinson Moselle in Treatment: 7 Education Assessment Education Provided To: Patient Education Topics Provided Wound/Skin Impairment: Handouts: Other: change dressing as ordered Methods: Demonstration, Explain/Verbal Responses: State content correctly Electronic Signature(s) Signed: 05/31/2016 3:40:23 PM By: Alric Quan Entered By: Alric Quan on 05/31/2016 10:58:42 [name] ([id number]) -------------------------------------------------------------------------------- Wound Assessment Details Patient Name: [name] Date of Service: 05/31/2016 10:45 AM Medical Record Patient Account Number: [id number] Number: Treating RN: Ahmed Prima [birth year] (82 y.o. Other Clinician: Date of Birth/Sex: Female) Treating ROBSON, [place] Primary Care Physician: Lorelee Market Physician/Extender: G Referring Physician: Lorelee Market Weeks in Treatment: 7 Wound Status Wound Number: 1 Primary Venous Leg Ulcer Etiology: Wound Location: Left Malleolus Wound Status: Open Wounding Event: Gradually Appeared Comorbid Cataracts, Hypertension, Date Acquired: 04/07/2015 History: Osteoarthritis Weeks Of Treatment: 7 Clustered Wound: No Photos Photo Uploaded By: Alric Quan on 05/31/2016 12:02:46 Wound Measurements Length: (cm) 0.7 Width: (cm) 0.5 Depth: (cm) 0.2 Area: (cm) 0.275 Volume: (cm) 0.055 % Reduction  in Area: 28.6% % Reduction in Volume: 28.6% Epithelialization: None Tunneling: No Undermining: No Wound Description Classification: Partial Thickness Wound Margin: Thickened Exudate Amount: Large Exudate Type: Serous Exudate Color: amber Foul Odor After Cleansing: No Wound Bed Granulation Amount: None Present (0%) Exposed Structure Necrotic Amount: Large (67-100%) Fascia Exposed: No Necrotic Quality: Adherent Slough Fat Layer Exposed: No Hendler, Elajah ([id number]) Tendon Exposed: No Muscle Exposed: No Joint Exposed: No Bone Exposed: No Limited to Skin Breakdown Periwound Skin Texture Texture Color No Abnormalities Noted: No No Abnormalities Noted: No Localized Edema: Yes Erythema: Yes Erythema Location: Circumferential Moisture No Abnormalities Noted: No Temperature / Pain Temperature: No Abnormality Tenderness on Palpation: Yes Wound Preparation Ulcer Cleansing: Rinsed/Irrigated with Saline Topical Anesthetic Applied: Other: lidocaine 4%, Treatment Notes Wound #1 (Left Malleolus) 1. Cleansed with: Clean wound with Normal Saline 2. Anesthetic Topical Lidocaine 4% cream to wound bed prior to debridement 4. Dressing Applied: Prisma Ag 5. Secondary Dressing Applied Dry Gauze Foam Kerlix/Conform 7. Secured with Recruitment consultant) Signed: 05/31/2016 3:40:23 PM By: Alric Quan Entered By: Alric Quan on 05/31/2016 10:52:05 [name] ([id number]) -------------------------------------------------------------------------------- Wound Assessment Details Patient Name: [name] Date of Service: 05/31/2016 10:45 AM Medical Record Patient Account Number: [id number] Number: Treating RN: Ahmed Prima 1919-01-01 (82 y.o. Other Clinician: Date of Birth/Sex: Female) Treating ROBSON, [place] Primary Care Physician: Lorelee Market Physician/Extender: G Referring Physician: Lorelee Market Weeks in Treatment:  7 Wound Status Wound Number: 2 Primary Arterial Insufficiency Ulcer Etiology: Wound Location: Left Toe Fifth - Lateral Wound Status: Open Wounding Event: Gradually Appeared Comorbid Cataracts, Hypertension, Date Acquired: 04/07/2015 History: Osteoarthritis Weeks Of Treatment: 7 Clustered Wound: No Photos Photo Uploaded By: Alric Quan on 05/31/2016 12:02:48 Wound Measurements Length: (cm) 0.3 Width: (cm) 0.5 Depth: (cm) 0.2 Area: (cm) 0.118 Volume: (cm) 0.024 % Reduction in Area: -151.1% % Reduction in Volume: -166.7% Epithelialization: None Tunneling: No Undermining: No Wound Description Classification: Partial Thickness Foul Odor Afte Wound Margin: Thickened Exudate Amount: Large Exudate Type: Serous Exudate Color: amber r Cleansing: No Wound Bed Granulation Amount: None Present (0%) Exposed Structure Necrotic Amount: Large (67-100%) Fascia Exposed: No Necrotic Quality: Adherent Slough Fat Layer Exposed: No Paster, Derek ([id number]) Tendon Exposed: No Muscle Exposed: No Joint Exposed: No Bone Exposed: No Limited to Skin Breakdown Periwound Skin Texture Texture  Color No Abnormalities Noted: No No Abnormalities Noted: No Localized Edema: Yes Erythema: Yes Erythema Location: Circumferential Moisture No Abnormalities Noted: No Temperature / Pain Temperature: No Abnormality Tenderness on Palpation: Yes Wound Preparation Ulcer Cleansing: Rinsed/Irrigated with Saline Topical Anesthetic Applied: Other: lidocaine 4%, Treatment Notes Wound #2 (Left, Lateral Toe Fifth) 1. Cleansed with: Clean wound with Normal Saline 2. Anesthetic Topical Lidocaine 4% cream to wound bed prior to debridement 4. Dressing Applied: Prisma Ag 5. Secondary Dressing Applied Dry Gauze Foam Kerlix/Conform 7. Secured with Recruitment consultant) Signed: 05/31/2016 3:40:23 PM By: Alric Quan Entered By: Alric Quan on 05/31/2016 10:52:57 [name] ([id number]) -------------------------------------------------------------------------------- [place] Details Patient Name: [name] Date of Service: 05/31/2016 10:45 AM Medical Record Patient Account Number: [id number] Number: Treating RN: Ahmed Prima June 18, 1919 (82 y.o. Other Clinician: Date of Birth/Sex: Female) Treating ROBSON, [place] Primary Care Physician: Lorelee Market Physician/Extender: G Referring Physician: Lorelee Market Weeks in Treatment: 7 Vital Signs Time Taken: 10:46 Temperature (F): 97.8 Height (in): 64 Pulse (bpm): 87 Weight (lbs): 158 Respiratory Rate (breaths/min): 17 Body Mass Index (BMI): 27.1 Blood Pressure (mmHg): 162/48 Reference Range: 80 - 120 mg / dl Electronic Signature(s) Signed: 05/31/2016 3:40:23 PM By: Alric Quan Entered By: Alric Quan on 05/31/2016 10:47:51

## 2016-06-01 NOTE — Progress Notes (Signed)
[name], [name] ([id number]) Visit Report for 05/31/2016 Chief Complaint Document Details Patient Name: [name], [name] Date of Service: 05/31/2016 10:45 AM Medical Record Patient Account Number: [id number] Number: Treating RN: Carolyne Fiscal, Debi [birth year] (82 y.o. Other Clinician: Date of Birth/Sex: Female) Treating Dwon Sky Primary Care Physician/Extender: Derrill Memo, [place] Physician: Referring Physician: Lorelee Market Weeks in Treatment: 7 Information Obtained from: Patient Chief Complaint 04/06/16; the patient is here for review of wounds on the left lateral ankle and left 5th toe that been present for a year Electronic Signature(s) Signed: 06/01/2016 7:31:48 AM By: [name] MD Entered By: [name] on 05/31/2016 11:37:04 [name] ([id number]) -------------------------------------------------------------------------------- Debridement Details Patient Name: [name] Date of Service: 05/31/2016 10:45 AM Medical Record Patient Account Number: [id number] Number: Treating RN: Carolyne Fiscal, Debi 12/10/1918 (82 y.o. Other Clinician: Date of Birth/Sex: Female) Treating Korrin Waterfield Primary Care Physician/Extender: Derrill Memo, [place] Physician: Referring Physician: Vinson Moselle in Treatment: 7 Debridement Performed for Wound #1 Left Malleolus Assessment: Performed By: Physician Ricard Dillon, MD Debridement: Debridement Pre-procedure Yes - 11:11 Verification/Time Out Taken: Start Time: 11:11 Pain Control: Other : lidocaine 4% cream Level: Skin/Subcutaneous Tissue Total Area Debrided (L x 0.7 (cm) x 0.5 (cm) = 0.35 (cm) W): Tissue and other Viable, Non-Viable, Exudate, Fibrin/Slough, Subcutaneous material debrided: Instrument: Curette Bleeding: Minimum Hemostasis Achieved: Pressure End Time: 11:13 Procedural Pain: 0 Post Procedural Pain: 0 Response to Treatment: Procedure was tolerated  well Post Debridement Measurements of Total Wound Length: (cm) 0.7 Width: (cm) 0.5 Depth: (cm) 0.2 Volume: (cm) 0.055 Character of Wound/Ulcer Post Requires Further Debridement Debridement: Severity of Tissue Post Debridement: Limited to breakdown of skin Post Procedure Diagnosis Same as Pre-procedure Electronic Signature(s) Signed: 05/31/2016 3:40:23 PM By: Myriam Jacobson, Estill Bamberg ([id number]) Signed: 06/01/2016 7:31:48 AM By: [name] MD Entered By: [name] on 05/31/2016 11:36:01 [name] ([id number]) -------------------------------------------------------------------------------- Debridement Details Patient Name: [name] Date of Service: 05/31/2016 10:45 AM Medical Record Patient Account Number: [id number] Number: Treating RN: Carolyne Fiscal, Debi 11/20/1918 (82 y.o. Other Clinician: Date of Birth/Sex: Female) Treating Lunabella Badgett Primary Care Physician/Extender: Derrill Memo, [place] Physician: Referring Physician: Lorelee Market Weeks in Treatment: 7 Debridement Performed for Wound #2 Left,Lateral Toe Fifth Assessment: Performed By: Physician Ricard Dillon, MD Debridement: Debridement Pre-procedure Yes - 11:11 Verification/Time Out Taken: Start Time: 11:13 Pain Control: Other : lidocaine 4% cream Level: Skin/Subcutaneous Tissue Total Area Debrided (L x 0.3 (cm) x 0.5 (cm) = 0.15 (cm) W): Tissue and other Viable, Non-Viable, Exudate, Fibrin/Slough, Subcutaneous material debrided: Instrument: Curette Bleeding: Minimum Hemostasis Achieved: Pressure End Time: 11:14 Procedural Pain: 0 Post Procedural Pain: 0 Response to Treatment: Procedure was tolerated well Post Debridement Measurements of Total Wound Length: (cm) 0.3 Width: (cm) 0.5 Depth: (cm) 0.2 Volume: (cm) 0.024 Character of Wound/Ulcer Post Requires Further Debridement Debridement: Severity of Tissue Post Debridement: Limited to breakdown  of skin Post Procedure Diagnosis Same as Pre-procedure Electronic Signature(s) Signed: 05/31/2016 3:40:23 PM By: Myriam Jacobson, Estill Bamberg ([id number]) Signed: 06/01/2016 7:31:48 AM By: [name] MD Entered By: [name] on 05/31/2016 11:36:43 [name] ([id number]) -------------------------------------------------------------------------------- HPI Details Patient Name: [name] Date of Service: 05/31/2016 10:45 AM Medical Record Patient Account Number: [id number] Number: Treating RN: Carolyne Fiscal, Debi 13-Jul-1919 (82 y.o. Other Clinician: Date of Birth/Sex: Female) Treating Finnley Lewis Primary Care Physician/Extender: Derrill Memo, [place] Physician: Referring Physician: Lorelee Market Weeks in Treatment: 7 History of Present Illness HPI Description: 04/06/16; this is a 82 year old woman who arrives accompanied by 2  daughters for a wound on her left ankle and her left fifth toe. These have apparently been present for a year. I'm not quite certain how she came to this clinic however she was being followed by Sharlotte Alamo her podiatrist for these wounds. She was also referred to [facility] and they apparently did a test presumably arterial studies although we don't have any of these results and we couldn't get through to the office today. The family but has been applying a combination of Bactroban and a light bandage and perhaps more recently Silvadene cream. She did have an x-ray of the foot roughly 6 months ago at the podiatry office the family was unaware that if there were any abnormalities. Apparently they have not seen any healing here. Our intake nurse noted a slight skin tear on the right anterior lower leg. Nobody seemed aware of this. ABIs calculated in this clinic was 0.3 on the right and 0.4 on the left I have reviewed things [facility]. There is very little information on this patient. She  apparently follows in current total clinic which we don't have information from. She has mentioned already been to a [facility]. She has a history of hypothyroidism, nephrolithiasis arthritis and has had a previous mastectomy. 04/13/16; patient's x-ray was normal. She is already been to see Dr. Delana Meyer vascular surgery. Her arterial exam was from November 2016 this showed a left ABI of 0.62 her right of 0.76. Her duplex ultrasound of the left leg showed biphasic waves in the common femoral and distal femoral artery however monophasic waves in the superficial femoral artery proximal and mid biphasic it distal. 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 The patient tells me that she has pain at night when she tries to lie down which is improved by getting up and sitting in the chair this sounds like claudication at rest. Her wounds are on the left medial malleolus and the dorsal fifth toe small punched out wounds that are right on bone. We use Santyl last week 04/20/16: nurse informed me pt has declined evaluation for significant PAD. she denies systemic s/s of infections. 04/27/16;; the patient has had noninvasive arterial studies done in November 2016. ABI and the left was 0.62. Monophasic waves at the superficial femoral artery. Occluded to the posterior tibial artery. So had greater than 50% stenosis of the right superficial femoral and greater than 50% stenosis of the left superficial femoral artery. She had bilateral tibial peroneal artery disease. Both of the wounds on the left fifth toe and left lateral malleolus have been present for more than a year. They have been to see vein and vascular. The patient has some pain but miraculously I think the wounds have largely been stable. No evidence of infection 05/04/16; she goes for a noninvasive study tomorrow and then sees Dr. Fletcher Anon on Monday. By the time she is here next week we should have a better picture of whether something can be done with regards to  her arterial flow. We continue to have ischemic-looking wounds on the left fifth toe and left lateral malleolus. 05/10/16; the patient went for her arterial studies and saw Dr. Fletcher Anon. As predicted she is felt to have critical [name], [name] ([id number]) limb ischemia. The feeling is that she has occlusion of the SFA. The feeling would she would be a candidate for a stent to the SFA. The patient did not make a decision to proceed with the procedure and she is here with family members to  discuss this me today. He shouldn't has a lot of pain and cannot sleep and rest well at night per her family. 05/24/16; the patient went and had a complex revascularization/angioplasty of the left superficial femoral artery followed by drug-coated balloon angioplasty and spots stenting. She tolerated the procedure well. She was recommended for dual antiplatelet drugs with Plavix and aspirin for at least a month. She went yesterday for I believe follow-up serial Dopplers and ABIs although I don't see these results. The patient is unfortunately complaining of a lot of pain in her bilateral lower legs below the knees from the ankle to the knees. She apparently was prescribed lidocaine and apparently put this on her legs instead of over the wound areas. This may have something to do with it however there is a lot of edema in her bilateral legs I was able to find her arterial studies from yesterday. The left ABI has improved up to 0.66 out as post left SFA stent. The bilateral great toe indices remain abnormal with the left being in the 0.25 range. Duplex ultrasound showed her left SFA stent is patent monophasic waveforms persist in the left leg Electronic Signature(s) Signed: 06/01/2016 7:31:48 AM By: [name] MD Entered By: [name] on 05/31/2016 11:47:00 [name] ([id number]) -------------------------------------------------------------------------------- Physical Exam Details Patient Name:  [name] Date of Service: 05/31/2016 10:45 AM Medical Record Patient Account Number: [id number] Number: Treating RN: Carolyne Fiscal, Debi 05/09/1919 (82 y.o. Other Clinician: Date of Birth/Sex: Female) Treating Lan Mcneill Primary Care Physician/Extender: Derrill Memo, [place] Physician: Referring Physician: Lorelee Market Weeks in Treatment: 7 Constitutional Patient is hypertensive.. Pulse regular and within target range for patient.Marland Kitchen Respirations regular, non-labored and within target range.. Temperature is normal and within the target range for the patient.Marland Kitchen Respiratory Respiratory effort is easy and symmetric bilaterally. Rate is normal at rest and on room air.. Bilateral breath sounds are clear and equal in all lobes with no wheezes, rales or rhonchi.. Cardiovascular Heart rhythm and rate regular, without murmur or gallop. No evidence of CHF. Pedal pulses are now present bilaterally although reduced. Her appears palpably warm. Edema present in both extremities. There is tense edema here. I'm not really sure of the etiology. Lymphatic Nonpalpable in the popliteal or inguinal area. Notes Wound exam; the 2 areas over her left fifth toe and the left lateral malleolus both appear better requiring debridement but there is some evidence of reasonably healthy granulation tissue Electronic Signature(s) Signed: 06/01/2016 7:31:48 AM By: [name] MD Entered By: [name] on 05/31/2016 11:44:51 [name] ([id number]) -------------------------------------------------------------------------------- Physician Orders Details Patient Name: [name] Date of Service: 05/31/2016 10:45 AM Medical Record Patient Account Number: [id number] Number: Treating RN: Carolyne Fiscal, Debi 09-28-1919 (82 y.o. Other Clinician: Date of Birth/Sex: Female) Treating Larenzo Caples Primary Care Physician/Extender: Derrill Memo, [place] Physician: Referring  Physician: Vinson Moselle in Treatment: 7 Verbal / Phone Orders: Yes Clinician: Carolyne Fiscal, Debi Read Back and Verified: Yes Diagnosis Coding Wound Cleansing Wound #1 Left Malleolus o Clean wound with Normal Saline. - for clinic use o Cleanse wound with mild soap and water o May Shower, gently pat wound dry prior to applying new dressing. Wound #2 Left,Lateral Toe Fifth o Clean wound with Normal Saline. - for clinic use o Cleanse wound with mild soap and water o May Shower, gently pat wound dry prior to applying new dressing. Anesthetic Wound #1 Left Malleolus o Topical Lidocaine 4% cream applied to wound bed prior to debridement - for clinic use Wound #  2 Left,Lateral Toe Fifth o Topical Lidocaine 4% cream applied to wound bed prior to debridement - for clinic use Primary Wound Dressing Wound #1 Left Malleolus o Prisma Ag - moisten with saline Wound #2 Left,Lateral Toe Fifth o Prisma Ag - moisten with saline Secondary Dressing Wound #1 Left Malleolus o Gauze and Kerlix/Conform - netting, tape o Foam Wound #2 Left,Lateral Toe Fifth o Gauze and Kerlix/Conform o Foam Brostrom, [place] ([id number]) Dressing Change Frequency Wound #1 Left Malleolus o Change dressing every other day. Wound #2 Left,Lateral Toe Fifth o Change dressing every other day. Follow-up Appointments Wound #1 Left Malleolus o Return Appointment in 1 week. Wound #2 Left,Lateral Toe Fifth o Return Appointment in 1 week. Edema Control Wound #1 Left Malleolus o Elevate legs to the level of the heart and pump ankles as often as possible o Other: - compression stockings Wound #2 Left,Lateral Toe Fifth o Elevate legs to the level of the heart and pump ankles as often as possible o Other: - compression stockings Additional Orders / Instructions Wound #1 Left Malleolus o Increase protein intake. Wound #2 Left,Lateral Toe Fifth o Increase protein  intake. Medications-please add to medication list. Wound #1 Left Malleolus o Other: - Vitamin C, Zinc, Multivitamins Wound #2 Left,Lateral Toe Fifth o Other: - Vitamin C, Zinc, Multivitamins Electronic Signature(s) Signed: 05/31/2016 3:40:23 PM By: Alric Quan Signed: 06/01/2016 7:31:48 AM By: [name] MD Entered By: Alric Quan on 05/31/2016 11:15:34 [name] ([id number]) -------------------------------------------------------------------------------- Problem List Details Patient Name: [name] Date of Service: 05/31/2016 10:45 AM Medical Record Patient Account Number: [id number] Number: Treating RN: Carolyne Fiscal, Debi 1919/02/17 (82 y.o. 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 Other Clinician: Date of Birth/Sex: Female) Treating Antwan Bribiesca,  Anthone Prieur Primary Care Physician/Extender: Derrill Memo, [place] Physician: Referring Physician: Lorelee Market Weeks in Treatment: 7 Subjective Chief Complaint Information obtained from Patient 04/06/16; the patient is here for review of wounds on the left lateral ankle and left 5th toe that been present for a year History of Present Illness (HPI) 04/06/16; this is a 82 year old woman who arrives accompanied by 2 daughters for a wound on her left ankle and her left fifth toe. These have apparently been present for a year. I'm not quite certain how she came to this clinic however she was being followed by Sharlotte Alamo her podiatrist for these wounds. She was also referred to [facility] and they apparently did a test presumably arterial studies although we don't have any of these results and we couldn't get through to the office today. The family but has been applying a combination of Bactroban and a light bandage and perhaps more recently Silvadene cream. She  did have an x-ray of the foot roughly 6 months ago at the podiatry office the family was unaware that if there were any abnormalities. Apparently they have not seen any healing here. Our intake nurse noted a slight skin tear on the right anterior lower leg. Nobody seemed aware of this. ABIs calculated in this clinic was 0.3 on the right and 0.4 on the left I have reviewed things [facility]. There is very little information on this patient. She apparently follows in current total clinic which we don't have information from. She has mentioned already been to a [facility]. She has a history of hypothyroidism, nephrolithiasis arthritis and has had a previous mastectomy. 04/13/16; patient's x-ray was normal. She is already been to see Dr. Delana Meyer vascular surgery. Her arterial exam was from November 2016 this showed a left ABI of 0.62 her right of 0.76. Her duplex ultrasound of the left leg showed biphasic waves in the common  femoral and distal femoral artery however monophasic waves in the superficial femoral artery proximal and mid biphasic it distal. 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 The patient tells me that she has pain at night when she tries to lie down which is improved by getting up and sitting in the chair this sounds like claudication at rest. Her wounds are on the left medial malleolus and the dorsal fifth toe small punched out wounds that are right on bone. We use Santyl last week 04/20/16: nurse informed me pt has declined evaluation for significant PAD. she denies systemic s/s of infections. 04/27/16;; the patient has had noninvasive arterial studies done in November 2016. ABI and the left was 0.62. Monophasic waves at the superficial femoral artery. Occluded to the posterior tibial artery. So had greater than 50% stenosis of the right superficial femoral and greater than 50% stenosis of the left superficial femoral artery. She had bilateral tibial peroneal artery disease. Both of the wounds on the left [name], [name] ([id number]) fifth toe and left lateral malleolus have been present for more than a year. They have been to see vein and vascular. The patient has some pain but miraculously I think the wounds have largely been stable. No evidence of infection 05/04/16; she goes for a noninvasive study tomorrow and then sees Dr. Fletcher Anon on Monday. By the time she is here next week we should have a better picture of whether something can be done with regards to her arterial flow. We continue to have ischemic-looking wounds on the left fifth toe and left lateral malleolus. 05/10/16; the patient went for her arterial studies and saw Dr. Fletcher Anon. As predicted she is felt to have critical limb ischemia. The feeling is that she has occlusion of the SFA. The feeling would she would be a candidate for a stent to the SFA. The patient did not make a decision to proceed with the procedure and she is here with  family members to discuss this me today. He shouldn't has a lot of pain and cannot sleep and rest well at night per her family. 05/24/16; the patient went and had a complex revascularization/angioplasty of the left superficial femoral artery followed by drug-coated balloon angioplasty and spots stenting. She tolerated the procedure well. She was recommended for dual antiplatelet drugs with Plavix and aspirin for at least a month. She went yesterday for I believe follow-up serial Dopplers and ABIs although I don't see these results. The patient is unfortunately complaining of a lot of pain in her bilateral lower legs below the  knees from the ankle to the knees. She apparently was prescribed lidocaine and apparently put this on her legs instead of over the wound areas. This may have something to do with it however there is a lot of edema in her bilateral legs I was able to find her arterial studies from yesterday. The left ABI has improved up to 0.66 out as post left SFA stent. The bilateral great toe indices remain abnormal with the left being in the 0.25 range. Duplex ultrasound showed her left SFA stent is patent monophasic waveforms persist in the left leg Objective Constitutional Patient is hypertensive.. Pulse regular and within target range for patient.Marland Kitchen Respirations regular, non-labored and within target range.. Temperature is normal and within the target range for the patient.. Vitals Time Taken: 10:46 AM, Height: 64 in, Weight: 158 lbs, BMI: 27.1, Temperature: 97.8 F, Pulse: 87 bpm, Respiratory Rate: 17 breaths/min, Blood Pressure: 162/48 mmHg. Respiratory Respiratory effort is easy and symmetric bilaterally. Rate is normal at rest and on room air.. Bilateral breath sounds are clear and equal in all lobes with no wheezes, rales or rhonchi.. Cardiovascular Heart rhythm and rate regular, without murmur or gallop. No evidence of CHF. Pedal pulses are now present bilaterally although  reduced. Her appears palpably warm. Edema present in both extremities. There is tense edema here. I'm not really sure of the etiology. Lymphatic Nonpalpable in the popliteal or inguinal area. [name], [name] ([id number]) General Notes: Wound exam; the 2 areas over her left fifth toe and the left lateral malleolus both appear better requiring debridement but there is some evidence of reasonably healthy granulation tissue Integumentary (Hair, Skin) Wound #1 status is Open. Original cause of wound was Gradually Appeared. The wound is located on the Left Malleolus. The wound measures 0.7cm length x 0.5cm width x 0.2cm depth; 0.275cm^2 area and 0.055cm^3 volume. The wound is limited to skin breakdown. There is no tunneling or undermining noted. There is a large amount of serous drainage noted. The wound margin is thickened. There is no granulation within the wound bed. There is a large (67-100%) amount of necrotic tissue within the wound bed including Adherent Slough. The periwound skin appearance exhibited: Localized Edema, Erythema. The surrounding wound skin color is noted with erythema which is circumferential. Periwound temperature was noted as No Abnormality. The periwound has tenderness on palpation. Wound #2 status is Open. Original cause of wound was Gradually Appeared. The wound is located on the Left,Lateral Toe Fifth. The wound measures 0.3cm length x 0.5cm width x 0.2cm depth; 0.118cm^2 area and 0.024cm^3 volume. The wound is limited to skin breakdown. There is no tunneling or undermining noted. There is a large amount of serous drainage noted. The wound margin is thickened. There is no granulation within the wound bed. There is a large (67-100%) amount of necrotic tissue within the wound bed including Adherent Slough. The periwound skin appearance exhibited: Localized Edema, Erythema. The surrounding wound skin color is noted with erythema which is circumferential. Periwound  temperature was noted as No Abnormality. The periwound has tenderness on palpation. 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  There was a Skin/Subcutaneous Tissue Debridement [id number]) debridement with total area of 0.35 sq cm performed by Ricard Dillon, MD. with the following instrument(s): Curette to remove Viable and Non-Viable tissue/material including Exudate, Fibrin/Slough, and Subcutaneous after achieving pain control using Other (lidocaine 4% cream). A time out was conducted at 11:11, prior to the start of the procedure. A Minimum amount of bleeding was controlled with Pressure. The procedure was tolerated well with a pain level of 0 throughout and a pain level of 0 following the procedure. Post Debridement Measurements: 0.7cm length x 0.5cm width x 0.2cm depth; [name], [name] ([id number]) 0.055cm^3 volume. Character of Wound/Ulcer Post Debridement requires further debridement. Severity of Tissue Post Debridement is: Limited to breakdown of skin. 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 There was a Skin/Subcutaneous Tissue Debridement [id number]) debridement with total area of 0.15 sq cm performed by Ricard Dillon, MD. with the following instrument(s): Curette to remove Viable and Non-Viable tissue/material including Exudate, Fibrin/Slough, and Subcutaneous after achieving pain control using Other (lidocaine 4% cream). A time out was conducted at  11:11, prior to the start of the procedure. A Minimum amount of bleeding was controlled with Pressure. The procedure was tolerated well with a pain level of 0 throughout and a pain level of 0 following the procedure. Post Debridement Measurements: 0.3cm length x 0.5cm width x 0.2cm depth; 0.024cm^3 volume. Character of Wound/Ulcer Post Debridement requires further debridement. Severity of Tissue Post Debridement is: Limited to breakdown of skin. Post procedure Diagnosis Wound #2: Same as Pre-Procedure Plan Wound Cleansing: Wound #1 Left Malleolus: Clean wound with Normal Saline. - for clinic use Cleanse wound with mild soap and water May Shower, gently pat wound dry prior to applying new dressing. Wound #2 Left,Lateral Toe Fifth: Clean wound with Normal Saline. - for clinic use Cleanse wound with mild soap and water May Shower, gently pat wound dry prior to applying new dressing. Anesthetic: Wound #1 Left Malleolus: Topical Lidocaine 4% cream applied to wound bed prior to debridement - for clinic use Wound #2 Left,Lateral Toe Fifth: Topical Lidocaine 4% cream applied to wound bed prior to debridement - for clinic use Primary Wound Dressing: Wound #1 Left Malleolus: Prisma Ag - moisten with saline Wound #2 Left,Lateral Toe Fifth: Prisma Ag - moisten with saline Secondary Dressing: Wound #1 Left Malleolus: Gauze and Kerlix/Conform - netting, tape Foam Wound #2 Left,Lateral Toe Fifth: [name], [name] ([id number]) Gauze and Kerlix/Conform Foam Dressing Change Frequency: Wound #1 Left Malleolus: Change dressing every other day. Wound #2 Left,Lateral Toe Fifth: Change dressing every other day. Follow-up Appointments: Wound #1 Left Malleolus: Return Appointment in 1 week. Wound #2 Left,Lateral Toe Fifth: Return Appointment in 1 week. Edema Control: Wound #1 Left Malleolus: Elevate legs to the level of the heart and pump ankles as often as possible Other: - compression  stockings Wound #2 Left,Lateral Toe Fifth: Elevate legs to the level of the heart and pump ankles as often as possible Other: - compression stockings Additional Orders / Instructions: Wound #1 Left Malleolus: Increase protein intake. Wound #2 Left,Lateral Toe Fifth: Increase protein intake. Medications-please add to medication list.: Wound #1 Left Malleolus: Other: - Vitamin C, Zinc, Multivitamins Wound #2 Left,Lateral Toe Fifth: Other: - Vitamin C, Zinc, Multivitamins o #1 she is status post left SFA stent with some improvement in her ABI on the left. She is complaining of a lot of pain in  her bilateral lower legs and the reason of this is not really clear. She did apparently called her legs and lidocaine and perhaps some of this was some form of dermatitis her legs are slightly red but no evidence of infection. She obviously still has ischemic risk although she was not complaining of that in her right leg previously. Both her wounds appeared to be improved #2 bilateral pitting edema which is tense up to her knees. I was wondering whether she had in his duplex studies which she did not. She may require this. She also had her Mobic and Advil stopped because she is now on Plavix and aspirin that may have something to do with this [name], [name] ([id number]) #3 in spite of the marginal improvement in her arterial studies her feet are much warmer and she appears to have a dorsalis pedis pulse Electronic Signature(s) Signed: 06/01/2016 7:31:48 AM By: [name] MD Entered By: [name] on 05/31/2016 11:50:18 [name] ([id number]) -------------------------------------------------------------------------------- SuperBill Details Patient Name: [name] Date of Service: 05/31/2016 Medical Record Patient Account Number: [id number] Number: Treating RN: Carolyne Fiscal, Debi 1919/09/11 (82 y.o. 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 right calf limited to breakdown of skin Facility Procedures CPT4: Description Modifier Quantity Code [id number] - DEB SUBQ TISSUE 20 SQ CM/< 1 ICD-10 Description Diagnosis I70.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 Physician Procedures CPT4: Description Modifier Quantity Code [id number] - WC PHYS SUBQ TISS 20 SQ CM 1 ICD-10 Description Diagnosis I70.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 Electronic Signature(s) Signed: 06/01/2016 7:31:48 AM By: [name] MD Entered By: [name] on 05/31/2016 11:50:32

## 2016-06-02 ENCOUNTER — Telehealth: Payer: Self-pay | Admitting: Cardiovascular Disease

## 2016-06-02 NOTE — Telephone Encounter (Signed)
Pt daughter, Angeline Slim, states pt is having pain in both legs, she asks if it is ok that she takes Meloxicam and Plavix together. Please call and advise.

## 2016-06-02 NOTE — Telephone Encounter (Signed)
S/w pt daughter, [name], who reports pt continues to have bilateral leg pain. Pharmacist told her it was acceptable to take meloxicam. Per verbal from Ignacia Bayley, NP, pt should avoid meloxicam while taking plavix. Tramadol may be helpful. [name] reports that pt has a prescription for tramadol and will try this for pain relief. She had no further questions at this time.

## 2016-06-07 ENCOUNTER — Encounter: Payer: Medicare PPO | Admitting: Internal Medicine

## 2016-06-07 DIAGNOSIS — I70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: Secondary | ICD-10-CM | POA: Diagnosis not present

## 2016-06-09 NOTE — Progress Notes (Signed)
KIMRA, LEIBFRIED ([id number]) Visit Report for 06/07/2016 Chief Complaint Document Details Patient Name: [name], [name] Date of Service: 06/07/2016 10:45 AM Medical Record Patient Account Number: [id number] Number: Treating RN: Carolyne Fiscal, Debi [birth year] (82 y.o. Other Clinician: Date of Birth/Sex: Female) Treating ROBSON, MICHAEL Primary Care Physician/Extender: Derrill Memo, [place] Physician: Referring Physician: Lorelee Market Weeks in Treatment: 8 Information Obtained from: Patient Chief Complaint 04/06/16; the patient is here for review of wounds on the left lateral ankle and left 5th toe that been present for a year Electronic Signature(s) Signed: 06/08/2016 4:32:31 PM By: [name] MD Entered By: [name] on 06/07/2016 13:39:24 [name] ([id number]) -------------------------------------------------------------------------------- HPI Details Patient Name: [name] Date of Service: 06/07/2016 10:45 AM Medical Record Patient Account Number: [id number] Number: Treating RN: Carolyne Fiscal, Debi [birth year] (82 y.o. Other Clinician: Date of Birth/Sex: Female) Treating ROBSON, MICHAEL Primary Care Physician/Extender: Derrill Memo, [place] Physician: Referring Physician: Lorelee Market Weeks in Treatment: 8 History of Present Illness HPI Description: 04/06/16; this is a 82 year old woman who arrives accompanied by 2 daughters for a wound on her left ankle and her left fifth toe. These have apparently been present for a year. I'm not quite certain how she came to this clinic however she was being followed by Sharlotte Alamo her podiatrist for these wounds. She was also referred to [facility] and they apparently did a test presumably arterial studies although we don't have any of these results and we couldn't get through to the office today. The family but has been applying a combination of Bactroban and a light bandage  and perhaps more recently Silvadene cream. She did have an x-ray of the foot roughly 6 months ago at the podiatry office the family was unaware that if there were any abnormalities. Apparently they have not seen any healing here. Our intake nurse noted a slight skin tear on the right anterior lower leg. Nobody seemed aware of this. ABIs calculated in this clinic was 0.3 on the right and 0.4 on the left I have reviewed things [facility]. There is very little information on this patient. She apparently follows in current total clinic which we don't have information from. She has mentioned already been to a [facility]. She has a history of hypothyroidism, nephrolithiasis arthritis and has had a previous mastectomy. 04/13/16; patient's x-ray was normal. She is already been to see Dr. Delana Meyer vascular surgery. Her arterial exam was from November 2016 this showed a left ABI of 0.62 her right of 0.76. Her duplex ultrasound of the left leg showed biphasic waves in the common femoral and distal femoral artery however monophasic waves in the superficial femoral artery proximal and mid biphasic it distal. 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 The patient tells me that she has pain at night when she tries to lie down which is improved by getting up and sitting in the chair this sounds like claudication at rest. Her wounds are on the left medial malleolus and the dorsal fifth toe small punched out wounds that are right on bone. We use Santyl last week 04/20/16: nurse informed me pt has declined evaluation for significant PAD. she denies systemic s/s of infections. 04/27/16;; the patient has had noninvasive arterial studies done in November 2016. ABI and the left was 0.62. Monophasic waves at the superficial femoral artery. Occluded to the posterior tibial artery. So had greater than 50% stenosis of the right superficial femoral and greater than 50% stenosis of the left superficial femoral artery. She had  bilateral tibial peroneal artery disease. Both of the wounds on the left fifth toe and left lateral malleolus have been present for more than a year. They have been to see vein and vascular. The patient has some pain but miraculously I think the wounds have largely been stable. No evidence of infection 05/04/16; she goes for a noninvasive study tomorrow and then sees Dr. Fletcher Anon on Monday. By the time she is here next week we should have a better picture of whether something can be done with regards to her arterial flow. We continue to have ischemic-looking wounds on the left fifth toe and left lateral malleolus. 05/10/16; the patient went for her arterial studies and saw Dr. Fletcher Anon. As predicted she is felt to have critical Maugeri, Amiree ([id number]) limb ischemia. The feeling is that she has occlusion of the SFA. The feeling would she would be a candidate for a stent to the SFA. The patient did not make a decision to proceed with the procedure and she is here with family members to discuss this me today. He shouldn't has a lot of pain and cannot sleep and rest well at night per her family. 05/24/16; the patient went and had a complex revascularization/angioplasty of the left superficial femoral artery followed by drug-coated balloon angioplasty and spots stenting. She tolerated the procedure well. She was recommended for dual antiplatelet drugs with Plavix and aspirin for at least a month. She went yesterday for I believe follow-up serial Dopplers and ABIs although I don't see these results. The patient is unfortunately complaining of a lot of pain in her bilateral lower legs below the knees from the ankle to the knees. She apparently was prescribed lidocaine and apparently put this on her legs instead of over the wound areas. This may have something to do with it however there is a lot of edema in her bilateral legs I was able to find her arterial studies from yesterday. The left ABI has improved  up to 0.66 post left SFA stent. The bilateral great toe indices remain abnormal with the left being in the 0.25 range. Duplex ultrasound showed her left SFA stent is patent monophasic waveforms persist in the left leg 06/07/16; continued punched-out areas over the dorsal left fifth toe and left lateral malleolus. No major improvement Electronic Signature(s) Signed: 06/08/2016 4:32:31 PM By: [name] MD Entered By: [name] on 06/07/2016 13:40:49 [name] ([id number]) -------------------------------------------------------------------------------- Physical Exam Details Patient Name: [name] Date of Service: 06/07/2016 10:45 AM Medical Record Patient Account Number: [id number] Number: Treating RN: Ahmed Prima 1918/11/19 (82 y.o. Other Clinician: Date of Birth/Sex: Female) Treating ROBSON, MICHAEL Primary Care Physician/Extender: Derrill Memo, [place] Physician: Referring Physician: Lorelee Market Weeks in Treatment: 8 Constitutional Patient is mildly hypertensive.. Pulse regular and within target range for patient.Marland Kitchen Respirations regular, non- labored and within target range.. Temperature is normal and within the target range for the patient.. Patient's appearance is neat and clean. Appears in no acute distress. Well nourished and well developed.. Eyes Conjunctivae clear. No discharge.Marland Kitchen Respiratory Respiratory effort is easy and symmetric bilaterally. Rate is normal at rest and on room air.. Cardiovascular Some changes of venous insufficiency although her edema is controlled. Lymphatic None palpable in the popliteal and inguinal area. Psychiatric No evidence of depression, anxiety, or agitation. Calm, cooperative, and communicative. Appropriate interactions and affect.. Notes Exam; the 2 areas over her left fifth toe dorsally in the left lateral malleolus really are unchanged. There is adherent slough which I did not debridement  today.  No evidence of surrounding infection. Patient states her pain is better Electronic Signature(s) Signed: 06/08/2016 4:32:31 PM By: [name] MD Entered By: [name] on 06/07/2016 13:44:24 [name] ([id number]) -------------------------------------------------------------------------------- Physician Orders Details Patient Name: [name] Date of Service: 06/07/2016 10:45 AM Medical Record Patient Account Number: [id number] Number: Treating RN: Ahmed Prima 15-Jul-1919 (82 y.o. Other Clinician: Date of Birth/Sex: Female) Treating ROBSON, MICHAEL Primary Care Physician/Extender: Derrill Memo, [place] Physician: Referring Physician: Vinson Moselle in Treatment: 8 Verbal / Phone Orders: Yes Clinician: Carolyne Fiscal, Debi Read Back and Verified: Yes Diagnosis Coding Wound Cleansing Wound #1 Left Malleolus o Clean wound with Normal Saline. - for clinic use o Cleanse wound with mild soap and water o May Shower, gently pat wound dry prior to applying new dressing. Wound #2 Left,Lateral Toe Fifth o Clean wound with Normal Saline. - for clinic use o Cleanse wound with mild soap and water o May Shower, gently pat wound dry prior to applying new dressing. Anesthetic Wound #1 Left Malleolus o Topical Lidocaine 4% cream applied to wound bed prior to debridement - for clinic use Wound #2 Left,Lateral Toe Fifth o Topical Lidocaine 4% cream applied to wound bed prior to debridement - for clinic use Primary Wound Dressing Wound #1 Left Malleolus o Prisma Ag - moisten with saline Wound #2 Left,Lateral Toe Fifth o Prisma Ag - moisten with saline Secondary Dressing Wound #1 Left Malleolus o Gauze and Kerlix/Conform - netting, tape o Foam Wound #2 Left,Lateral Toe Fifth o Gauze and Kerlix/Conform o Foam Rivkin, Sulay ([id number]) Dressing Change Frequency Wound #1 Left Malleolus o Change dressing every other  day. Wound #2 Left,Lateral Toe Fifth o Change dressing every other day. Follow-up Appointments Wound #1 Left Malleolus o Return Appointment in 1 week. Wound #2 Left,Lateral Toe Fifth o Return Appointment in 1 week. Edema Control Wound #1 Left Malleolus o Elevate legs to the level of the heart and pump ankles as often as possible o Other: - compression stockings Wound #2 Left,Lateral Toe Fifth o Elevate legs to the level of the heart and pump ankles as often as possible o Other: - compression stockings Additional Orders / Instructions Wound #1 Left Malleolus o Increase protein intake. Wound #2 Left,Lateral Toe Fifth o Increase protein intake. Medications-please add to medication list. Wound #1 Left Malleolus o Other: - Vitamin C, Zinc, Multivitamins Wound #2 Left,Lateral Toe Fifth o Other: - Vitamin C, Zinc, Multivitamins Tramadol Electronic Signature(s) Signed: 06/07/2016 4:22:14 PM By: Alric Quan Signed: 06/08/2016 4:32:31 PM By: [name] MD Entered By: Alric Quan on 06/07/2016 11:19:52 [name] ([id number]) -------------------------------------------------------------------------------- Problem List Details Patient Name: [name] Date of Service: 06/07/2016 10:45 AM Medical Record Patient Account Number: [id number] Number: Treating RN: Carolyne Fiscal, Debi November 16, 1918 (82 y.o. 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 Other Clinician: Date of Birth/Sex: Female) Treating ROBSON, MICHAEL Primary Care Physician/Extender: Derrill Memo, [place] Physician: Referring Physician: Lorelee Market Weeks in Treatment: 8 Subjective Chief Complaint Information obtained from Patient 04/06/16; the patient is here for review of wounds on the left lateral ankle and left 5th toe that been present for a year History of Present Illness (HPI) 04/06/16; this is a 82 year old woman who arrives accompanied by 2 daughters for a wound on her left ankle and her left fifth toe. These have apparently been present for a year. I'm not quite certain how she came to this clinic however she was being followed by Sharlotte Alamo her podiatrist for these wounds. She was also referred to [facility] and they apparently did a test presumably arterial studies although we don't have any of these results and we couldn't get through to the office today. The family but has been applying a combination of Bactroban and a light bandage and perhaps more recently Silvadene cream. She did have an x-ray of the foot roughly 6 months ago at the podiatry office the family was unaware that if there were any abnormalities. Apparently they have not seen any healing here. Our intake nurse noted a slight skin tear on the right anterior lower leg.  Nobody seemed aware of this. ABIs calculated in this clinic was 0.3 on the right and 0.4 on the left I have reviewed things [facility]. There is very little information on this patient. She apparently follows in current total clinic which we don't have information from. She has mentioned already been to a [facility]. She has a history of hypothyroidism, nephrolithiasis arthritis and has had a previous mastectomy. 04/13/16; patient's x-ray was normal. She is already been to see Dr. Delana Meyer vascular surgery. Her arterial exam was from November 2016 this showed a left ABI of 0.62 her right of 0.76. Her duplex ultrasound of the left leg showed biphasic waves in the common femoral and distal femoral artery however monophasic waves in the superficial femoral artery proximal and mid biphasic it distal. 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 The patient tells me that she has pain at night when she tries to lie down which is improved by getting up and sitting in the chair this sounds like claudication at rest. Her wounds are on the left medial malleolus and the dorsal fifth toe small punched out wounds that are right on bone. We use Santyl last week 04/20/16: nurse informed me pt has declined evaluation for significant PAD. she denies systemic s/s of infections. 04/27/16;; the patient has had noninvasive arterial studies done in November 2016. ABI and the left was 0.62. Monophasic waves at the superficial femoral artery. Occluded to the posterior tibial artery. So had greater than 50% stenosis of the right superficial femoral and greater than 50% stenosis of the left superficial femoral artery. She had bilateral tibial peroneal artery disease. Both of the wounds on the left Surrette, Sorah ([id number]) fifth toe and left lateral malleolus have been present for more than a year. They have been to see vein and vascular. The patient has some pain but miraculously I think the wounds have largely been  stable. No evidence of infection 05/04/16; she goes for a noninvasive study tomorrow and then sees Dr. Fletcher Anon on Monday. By the time she is here next week we should have a  better picture of whether something can be done with regards to her arterial flow. We continue to have ischemic-looking wounds on the left fifth toe and left lateral malleolus. 05/10/16; the patient went for her arterial studies and saw Dr. Fletcher Anon. As predicted she is felt to have critical limb ischemia. The feeling is that she has occlusion of the SFA. The feeling would she would be a candidate for a stent to the SFA. The patient did not make a decision to proceed with the procedure and she is here with family members to discuss this me today. He shouldn't has a lot of pain and cannot sleep and rest well at night per her family. 05/24/16; the patient went and had a complex revascularization/angioplasty of the left superficial femoral artery followed by drug-coated balloon angioplasty and spots stenting. She tolerated the procedure well. She was recommended for dual antiplatelet drugs with Plavix and aspirin for at least a month. She went yesterday for I believe follow-up serial Dopplers and ABIs although I don't see these results. The patient is unfortunately complaining of a lot of pain in her bilateral lower legs below the knees from the ankle to the knees. She apparently was prescribed lidocaine and apparently put this on her legs instead of over the wound areas. This may have something to do with it however there is a lot of edema in her bilateral legs I was able to find her arterial studies from yesterday. The left ABI has improved up to 0.66 post left SFA stent. The bilateral great toe indices remain abnormal with the left being in the 0.25 range. Duplex ultrasound showed her left SFA stent is patent monophasic waveforms persist in the left leg 06/07/16; continued punched-out areas over the dorsal left fifth toe and left  lateral malleolus. No major improvement Objective Constitutional Patient is mildly hypertensive.. Pulse regular and within target range for patient.Marland Kitchen Respirations regular, non- labored and within target range.. Temperature is normal and within the target range for the patient.. Patient's appearance is neat and clean. Appears in no acute distress. Well nourished and well developed.. Vitals Time Taken: 10:59 AM, Height: 64 in, Weight: 158 lbs, BMI: 27.1, Temperature: 98.5 F, Pulse: 86 bpm, Respiratory Rate: 17 breaths/min, Blood Pressure: 152/60 mmHg. Eyes Conjunctivae clear. No discharge.Marland Kitchen Respiratory Respiratory effort is easy and symmetric bilaterally. Rate is normal at rest and on room air.. Cardiovascular Some changes of venous insufficiency although her edema is controlled. JETTA, MEER ([id number]) Lymphatic None palpable in the popliteal and inguinal area. Psychiatric No evidence of depression, anxiety, or agitation. Calm, cooperative, and communicative. Appropriate interactions and affect.. General Notes: Exam; the 2 areas over her left fifth toe dorsally in the left lateral malleolus really are unchanged. There is adherent slough which I did not debridement today. No evidence of surrounding infection. Patient states her pain is better Integumentary (Hair, Skin) Wound #1 status is Open. Original cause of wound was Gradually Appeared. The wound is located on the Left Malleolus. The wound measures 1cm length x 0.2cm width x 0.2cm depth; 0.157cm^2 area and 0.031cm^3 volume. The wound is limited to skin breakdown. There is no tunneling or undermining noted. There is a large amount of serous drainage noted. The wound margin is thickened. There is no granulation within the wound bed. There is a large (67-100%) amount of necrotic tissue within the wound bed including Adherent Slough. The periwound skin appearance exhibited: Localized Edema, Erythema. The surrounding wound skin  color is noted with erythema which is circumferential.  Periwound temperature was noted as No Abnormality. The periwound has tenderness on palpation. Wound #2 status is Open. Original cause of wound was Gradually Appeared. The wound is located on the Left,Lateral Toe Fifth. The wound measures 0.3cm length x 0.4cm width x 0.2cm depth; 0.094cm^2 area and 0.019cm^3 volume. The wound is limited to skin breakdown. There is no tunneling or undermining noted. There is a large amount of serous drainage noted. The wound margin is thickened. There is no granulation within the wound bed. There is a large (67-100%) amount of necrotic tissue within the wound bed including Adherent Slough. The periwound skin appearance exhibited: Localized Edema, Erythema. The surrounding wound skin color is noted with erythema which is circumferential. Periwound temperature was noted as No Abnormality. The periwound has tenderness on palpation. 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 - for clinic use Cleanse wound with mild soap and water May Shower, gently pat wound dry prior to applying new dressing. Wound #2 Left,Lateral Toe Fifth: Clean wound with Normal Saline. - for clinic use Cleanse wound with mild soap and water May Shower, gently pat wound dry prior to applying new dressing. Anesthetic: Wound #1 Left Malleolus: Topical Lidocaine 4% cream applied to wound bed prior to debridement - for clinic use Wound #2 Left,Lateral Toe Fifth: Topical Lidocaine 4% cream applied to  wound bed prior to debridement - for clinic use Primary Wound Dressing: Wound #1 Left Malleolus: Prisma Ag - moisten with saline Wound #2 Left,Lateral Toe Fifth: Prisma Ag - moisten with saline Secondary Dressing: Wound #1 Left Malleolus: Gauze and Kerlix/Conform - netting, tape Foam Wound #2 Left,Lateral Toe Fifth: Gauze and Kerlix/Conform Foam Dressing Change Frequency: Wound #1 Left Malleolus: Change dressing every other day. Wound #2 Left,Lateral Toe Fifth: Change dressing every other day. Follow-up Appointments: Wound #1 Left Malleolus: Return Appointment in 1 week. Wound #2 Left,Lateral Toe Fifth: Return Appointment in 1 week. Edema Control: Wound #1 Left Malleolus: Elevate legs to the level of the heart and pump ankles as often as possible Other: - compression stockings Wound #2 Left,Lateral Toe Fifth: Elevate legs to the level of the heart and pump ankles as often as possible Other: - compression stockings Additional Orders / Instructions: Wound #1 Left Malleolus: Increase protein intake. Wound #2 Left,Lateral Toe Fifth: Increase protein intake. Medications-please add to medication list.: Wound #1 Left Malleolus: LOUELLA, LAMBETH ([id number]) Other: - Vitamin C, Zinc, Multivitamins Wound #2 Left,Lateral Toe Fifth: Other: - Vitamin C, Zinc, Multivitamins Tramadol continue prisma,consider santyl next week Electronic Signature(s) Signed: 06/08/2016 4:32:31 PM By: [name] MD Entered By: [name] on 06/07/2016 13:45:35 [name] ([id number]) -------------------------------------------------------------------------------- SuperBill Details Patient Name: [name] Date of Service: 06/07/2016 Medical Record Patient Account Number: [id number] Number: Treating RN: Ahmed Prima [birth year] (82 y.o. 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

## 2016-06-09 NOTE — Progress Notes (Signed)
[name], [name] ([id number]) Visit Report for 06/07/2016 Arrival Information Details Patient Name: [name] Date of Service: 06/07/2016 10:45 AM Medical Record Patient Account Number: [id number] Number: Treating RN: Ahmed Prima 1919-06-06 (82 y.o. Other Clinician: Date of Birth/Sex: Female) Treating ROBSON, [place] Primary Care Physician: Lorelee Market Physician/Extender: G Referring Physician: Vinson Moselle in Treatment: 8 Visit Information History Since Last Visit All ordered tests and consults were completed: No Patient Arrived: Ambulatory Added or deleted any medications: No Arrival Time: 10:56 Any new allergies or adverse reactions: No Accompanied By: daughter Had a fall or experienced change in No Transfer Assistance: None activities of daily living that may affect Patient Identification Verified: Yes risk of falls: Secondary Verification Process Yes Signs or symptoms of abuse/neglect since last No Completed: visito Patient Requires Transmission- No Hospitalized since last visit: No Based Precautions: Pain Present Now: No Patient Has Alerts: Yes Patient Alerts: 7/31 ABI: R-0.55 L-0.40 [facility] Electronic Signature(s) Signed: 06/07/2016 4:22:14 PM By: Alric Quan Entered By: Alric Quan on 06/07/2016 10:56:34 [name] ([id number]) -------------------------------------------------------------------------------- Clinic Level of Care Assessment Details Patient Name: [name] Date of Service: 06/07/2016 10:45 AM Medical Record Patient Account Number: [id number] Number: Treating RN: Ahmed Prima Feb 19, 1919 (82 y.o. Other Clinician: Date of Birth/Sex: Female) Treating ROBSON, [place] Primary Care Physician: Lorelee Market Physician/Extender: G Referring Physician: Vinson Moselle in Treatment: 8 Clinic Level of Care Assessment Items TOOL 4 Quantity Score X - Use  when only an EandM is performed on FOLLOW-UP visit 1 0 ASSESSMENTS - Nursing Assessment / Reassessment X - Reassessment of Co-morbidities (includes updates in patient status) 1 10 X - Reassessment of Adherence to Treatment Plan 1 5 ASSESSMENTS - Wound and Skin Assessment / Reassessment []  - Simple Wound Assessment / Reassessment - one wound 0 X - Complex Wound Assessment / Reassessment - multiple wounds 2 5 []  - Dermatologic / Skin Assessment (not related to wound area) 0 ASSESSMENTS - Focused Assessment []  - Circumferential Edema Measurements - multi extremities 0 []  - Nutritional Assessment / Counseling / Intervention 0 []  - Lower Extremity Assessment (monofilament, tuning fork, pulses) 0 []  - Peripheral Arterial Disease Assessment (using hand held doppler) 0 ASSESSMENTS - Ostomy and/or Continence Assessment and Care []  - Incontinence Assessment and Management 0 []  - Ostomy Care Assessment and Management (repouching, etc.) 0 PROCESS - Coordination of Care X - Simple Patient / Family Education for ongoing care 1 15 []  - Complex (extensive) Patient / Family Education for ongoing care 0 []  - Staff obtains Programmer, systems, Records, Test Results / Process Orders 0 []  - Staff telephones HHA, Nursing Homes / Clarify orders / etc 0 [name], [name] ([id number]) []  - Routine Transfer to another Facility (non-emergent condition) 0 []  - Routine Hospital Admission (non-emergent condition) 0 []  - New Admissions / Biomedical engineer / Ordering NPWT, Apligraf, etc. 0 []  - Emergency Hospital Admission (emergent condition) 0 X - Simple Discharge Coordination 1 10 []  - Complex (extensive) Discharge Coordination 0 PROCESS - Special Needs []  - Pediatric / Minor Patient Management 0 []  - Isolation Patient Management 0 []  - Hearing / Language / Visual special needs 0 []  - Assessment of Community assistance (transportation, D/C planning, etc.) 0 []  - Additional assistance / Altered mentation 0 []  -  Support Surface(s) Assessment (bed, cushion, seat, etc.) 0 INTERVENTIONS - Wound Cleansing / Measurement []  - Simple Wound Cleansing - one wound 0 X - Complex Wound Cleansing - multiple wounds 2 5 X - Wound Imaging (photographs - any  number of wounds) 1 5 []  - Wound Tracing (instead of photographs) 0 []  - Simple Wound Measurement - one wound 0 X - Complex Wound Measurement - multiple wounds 2 5 INTERVENTIONS - Wound Dressings []  - Small Wound Dressing one or multiple wounds 0 X - Medium Wound Dressing one or multiple wounds 1 15 []  - Large Wound Dressing one or multiple wounds 0 X - Application of Medications - topical 1 5 []  - Application of Medications - injection 0 [name], [name] ([id number]) INTERVENTIONS - Miscellaneous []  - External ear exam 0 []  - Specimen Collection (cultures, biopsies, blood, body fluids, etc.) 0 []  - Specimen(s) / Culture(s) sent or taken to Lab for analysis 0 []  - Patient Transfer (multiple staff / Harrel Lemon Lift / Similar devices) 0 []  - Simple Staple / Suture removal (25 or less) 0 []  - Complex Staple / Suture removal (26 or more) 0 []  - Hypo / Hyperglycemic Management (close monitor of Blood Glucose) 0 []  - Ankle / Brachial Index (ABI) - do not check if billed separately 0 X - Vital Signs 1 5 Has the patient been seen at the hospital within the last three years: Yes Total Score: 100 Level Of Care: New/Established - Level 3 Electronic Signature(s) Signed: 06/07/2016 4:22:14 PM By: Alric Quan Entered By: Alric Quan on 06/07/2016 15:38:13 [name] ([id number]) -------------------------------------------------------------------------------- Encounter Discharge Information Details Patient Name: [name] Date of Service: 06/07/2016 10:45 AM Medical Record Patient Account Number: [id number] Number: Treating RN: Carolyne Fiscal, Debi January 21, 1919 (82 y.o. Other Clinician: Date of Birth/Sex: Female) Treating ROBSON,  [place] Primary Care Physician: Lorelee Market Physician/Extender: G Referring Physician: Vinson Moselle in Treatment: 8 Encounter Discharge Information Items Discharge Pain Level: 0 Discharge Condition: Stable Ambulatory Status: Ambulatory Discharge Destination: Home Transportation: Private Auto Accompanied By: daughter Schedule Follow-up Appointment: Yes Medication Reconciliation completed and provided to Patient/Care Yes [name] [name]: Provided on Clinical Summary of Care: 06/07/2016 Form Type Recipient Paper Patient [facility] Electronic Signature(s) Signed: 06/07/2016 11:32:10 AM By: Ruthine Dose Entered By: Ruthine Dose on 06/07/2016 11:32:10 [name] ([id number]) -------------------------------------------------------------------------------- Lower Extremity Assessment Details Patient Name: [name] Date of Service: 06/07/2016 10:45 AM Medical Record Patient Account Number: [id number] Number: Treating RN: Ahmed Prima February 06, 1919 (82 y.o. Other Clinician: Date of Birth/Sex: Female) Treating ROBSON, [place] Primary Care Physician: Lorelee Market Physician/Extender: G Referring Physician: Lorelee Market Weeks in Treatment: 8 Vascular Assessment Pulses: Posterior Tibial Dorsalis Pedis Palpable: [Left:Yes] Extremity colors, hair growth, and conditions: Extremity Color: [Left:Normal] Temperature of Extremity: [Left:Warm] Capillary Refill: [Left:< 3 seconds] Toe Nail Assessment Left: Right: Thick: No Discolored: No Deformed: No Improper Length and Hygiene: No Electronic Signature(s) Signed: 06/07/2016 4:22:14 PM By: Alric Quan Entered By: Alric Quan on 06/07/2016 11:05:30 [name] ([id number]) -------------------------------------------------------------------------------- Multi Wound Chart Details Patient Name: [name] Date of Service: 06/07/2016 10:45 AM Medical Record Patient Account Number:  [id number] Number: Treating RN: Ahmed Prima March 25, 1919 (82 y.o. Other Clinician: Date of Birth/Sex: Female) Treating ROBSON, [place] Primary Care Physician: Lorelee Market Physician/Extender: G Referring Physician: Lorelee Market Weeks in Treatment: 8 Vital Signs Height(in): 64 Pulse(bpm): 86 Weight(lbs): 158 Blood Pressure 152/60 (mmHg): Body Mass Index(BMI): 27 Temperature(F): 98.5 Respiratory Rate 17 (breaths/min): Photos: [1:No Photos] [2:No Photos] [N/A:N/A] Wound Location: [1:Left Malleolus] [2:Left Toe Fifth - Lateral] [N/A:N/A] Wounding Event: [1:Gradually Appeared] [2:Gradually Appeared] [N/A:N/A] Primary Etiology: [1:Venous Leg Ulcer] [2:Arterial Insufficiency Ulcer N/A] Comorbid History: [1:Cataracts, Hypertension, Osteoarthritis] [2:Cataracts, Hypertension, N/A Osteoarthritis] Date Acquired: [1:04/07/2015] [2:04/07/2015] [N/A:N/A] Weeks of Treatment: [1:8] [2:8] [N/A:N/A] Wound  Status: [1:Open] [2:Open] [N/A:N/A] Measurements L x W x D 1x0.2x0.2 [2:0.3x0.4x0.2] [N/A:N/A] (cm) Area (cm) : [1:0.157] [2:0.094] [N/A:N/A] Volume (cm) : [1:0.031] [2:0.019] [N/A:N/A] % Reduction in Area: [1:59.20%] [2:-100.00%] [N/A:N/A] % Reduction in Volume: 59.70% [2:-111.10%] [N/A:N/A] Classification: [1:Partial Thickness] [2:Partial Thickness] [N/A:N/A] Exudate Amount: [1:Large] [2:Large] [N/A:N/A] Exudate Type: [1:Serous] [2:Serous] [N/A:N/A] Exudate Color: [1:amber] [2:amber] [N/A:N/A] Wound Margin: [1:Thickened] [2:Thickened] [N/A:N/A] Granulation Amount: [1:None Present (0%)] [2:None Present (0%)] [N/A:N/A] Necrotic Amount: [1:Large (67-100%)] [2:Large (67-100%)] [N/A:N/A] Exposed Structures: [1:Fascia: No Fat: No Tendon: No Muscle: No Joint: No Bone: No] [2:Fascia: No Fat: No Tendon: No Muscle: No Joint: No Bone: No] [N/A:N/A] Limited to Skin Limited to Skin Breakdown Breakdown Epithelialization: None None N/A Periwound Skin Texture: Edema: Yes  Edema: Yes N/A Periwound Skin No Abnormalities Noted No Abnormalities Noted N/A Moisture: Periwound Skin Color: Erythema: Yes Erythema: Yes N/A Erythema Location: Circumferential Circumferential N/A Temperature: No Abnormality No Abnormality N/A Tenderness on Yes Yes N/A Palpation: Wound Preparation: Ulcer Cleansing: Ulcer Cleansing: N/A Rinsed/Irrigated with Rinsed/Irrigated with Saline Saline Topical Anesthetic Topical Anesthetic Applied: Other: lidocaine Applied: Other: lidocaine 4% 4% Treatment Notes Electronic Signature(s) Signed: 06/07/2016 4:22:14 PM By: Alric Quan Entered By: Alric Quan on 06/07/2016 11:06:40 [name] ([id number]) -------------------------------------------------------------------------------- Multi-Disciplinary Care Plan Details Patient Name: [name] Date of Service: 06/07/2016 10:45 AM Medical Record Patient Account Number: [id number] Number: Treating RN: Ahmed Prima August 02, 1919 (82 y.o. Other Clinician: Date of Birth/Sex: Female) Treating ROBSON, [place] Primary Care Physician: Lorelee Market Physician/Extender: G Referring Physician: Vinson Moselle in Treatment: 8 Active Inactive Abuse / Safety / Falls / Self Care Management Nursing Diagnoses: Potential for falls Goals: Patient will remain injury free Date Initiated: 04/06/2016 Goal Status: Active Interventions: Assess fall risk on admission and as needed Notes: Nutrition Nursing Diagnoses: Imbalanced nutrition Goals: Patient/caregiver agrees to and verbalizes understanding of need to use nutritional supplements and/or vitamins as prescribed Date Initiated: 04/06/2016 Goal Status: Active Interventions: Assess patient nutrition upon admission and as needed per policy Notes: Orientation to the Wound Care Program Nursing Diagnoses: Knowledge deficit related to the wound healing center program [name], [name]  ([id number]) Goals: Patient/caregiver will verbalize understanding of the [place] Program Date Initiated: 04/06/2016 Goal Status: Active Interventions: Provide education on orientation to the wound center Notes: Pain, Acute or Chronic Nursing Diagnoses: Pain, acute or chronic: actual or potential Potential alteration in comfort, pain Goals: Patient will verbalize adequate pain control and receive pain control interventions during procedures as needed Date Initiated: 04/06/2016 Goal Status: Active Interventions: Assess comfort goal upon admission Complete pain assessment as per visit requirements Notes: Wound/Skin Impairment Nursing Diagnoses: Impaired tissue integrity Goals: Ulcer/skin breakdown will have a volume reduction of 30% by week 4 Date Initiated: 04/06/2016 Goal Status: Active Ulcer/skin breakdown will have a volume reduction of 50% by week 8 Date Initiated: 04/06/2016 Goal Status: Active Ulcer/skin breakdown will have a volume reduction of 80% by week 12 Date Initiated: 04/06/2016 Goal Status: Active Interventions: Assess patient/caregiver ability to obtain necessary supplies [place], [place] ([id number]) Assess ulceration(s) every visit Notes: Electronic Signature(s) Signed: 06/07/2016 4:22:14 PM By: Alric Quan Entered By: Alric Quan on 06/07/2016 11:06:33 [name] ([id number]) -------------------------------------------------------------------------------- Pain Assessment Details Patient Name: [name] Date of Service: 06/07/2016 10:45 AM Medical Record Patient Account Number: [id number] Number: Treating RN: Ahmed Prima [birth year] (82 y.o. Other Clinician: Date of Birth/Sex: Female) Treating ROBSON, [place] Primary Care Physician: Lorelee Market Physician/Extender: G Referring Physician: Lorelee Market Weeks in Treatment: 8 Active Problems Location of Pain  Severity and Description of  Pain Patient Has Paino No Site Locations With Dressing Change: No Pain Management and Medication Current Pain Management: Electronic Signature(s) Signed: 06/07/2016 4:22:14 PM By: Alric Quan Entered By: Alric Quan on 06/07/2016 10:56:40 [name] ([id number]) -------------------------------------------------------------------------------- Patient/Caregiver Education Details Patient Name: [name] Date of Service: 06/07/2016 10:45 AM Medical Record Patient Account Number: [id number] Number: Treating RN: Carolyne Fiscal, Debi 1919/03/08 (82 y.o. Other Clinician: Date of Birth/Gender: Female) Treating ROBSON, [place] Primary Care Physician: Lorelee Market Physician/Extender: G Referring Physician: Vinson Moselle in Treatment: 8 Education Assessment Education Provided To: Patient Education Topics Provided Wound/Skin Impairment: Handouts: Other: change dressing as ordered Methods: Demonstration, Explain/Verbal Responses: State content correctly Electronic Signature(s) Signed: 06/07/2016 4:22:14 PM By: Alric Quan Entered By: Alric Quan on 06/07/2016 11:14:17 [name] ([id number]) -------------------------------------------------------------------------------- Wound Assessment Details Patient Name: [name] Date of Service: 06/07/2016 10:45 AM Medical Record Patient Account Number: [id number] Number: Treating RN: Ahmed Prima [birth year] (82 y.o. Other Clinician: Date of Birth/Sex: Female) Treating ROBSON, [place] Primary Care Physician: Lorelee Market Physician/Extender: G Referring Physician: Lorelee Market Weeks in Treatment: 8 Wound Status Wound Number: 1 Primary Venous Leg Ulcer Etiology: Wound Location: Left Malleolus Wound Status: Open Wounding Event: Gradually Appeared Comorbid Cataracts, Hypertension, Date Acquired: 04/07/2015 History: Osteoarthritis Weeks Of Treatment:  8 Clustered Wound: No Photos Photo Uploaded By: Alric Quan on 06/07/2016 15:47:56 Wound Measurements Length: (cm) 1 Width: (cm) 0.2 Depth: (cm) 0.2 Area: (cm) 0.157 Volume: (cm) 0.031 % Reduction in Area: 59.2% % Reduction in Volume: 59.7% Epithelialization: None Tunneling: No Undermining: No Wound Description Classification: Partial Thickness Wound Margin: Thickened Exudate Amount: Large Exudate Type: Serous Exudate Color: amber Foul Odor After Cleansing: No Wound Bed Granulation Amount: None Present (0%) Exposed Structure Necrotic Amount: Large (67-100%) Fascia Exposed: No Necrotic Quality: Adherent Slough Fat Layer Exposed: No [name], [name] ([id number]) Tendon Exposed: No Muscle Exposed: No Joint Exposed: No Bone Exposed: No Limited to Skin Breakdown Periwound Skin Texture Texture Color No Abnormalities Noted: No No Abnormalities Noted: No Localized Edema: Yes Erythema: Yes Erythema Location: Circumferential Moisture No Abnormalities Noted: No Temperature / Pain Temperature: No Abnormality Tenderness on Palpation: Yes Wound Preparation Ulcer Cleansing: Rinsed/Irrigated with Saline Topical Anesthetic Applied: Other: lidocaine 4%, Electronic Signature(s) Signed: 06/07/2016 4:22:14 PM By: Alric Quan Entered By: Alric Quan on 06/07/2016 11:05:58 [name] ([id number]) -------------------------------------------------------------------------------- Wound Assessment Details Patient Name: [name] Date of Service: 06/07/2016 10:45 AM Medical Record Patient Account Number: [id number] Number: Treating RN: Ahmed Prima 12/04/1918 (82 y.o. Other Clinician: Date of Birth/Sex: Female) Treating ROBSON, [place] Primary Care Physician: Lorelee Market Physician/Extender: G Referring Physician: Lorelee Market Weeks in Treatment: 8 Wound Status Wound Number: 2 Primary Arterial Insufficiency  Ulcer Etiology: Wound Location: Left Toe Fifth - Lateral Wound Status: Open Wounding Event: Gradually Appeared Comorbid Cataracts, Hypertension, Date Acquired: 04/07/2015 History: Osteoarthritis Weeks Of Treatment: 8 Clustered Wound: No Photos Photo Uploaded By: Alric Quan on 06/07/2016 15:47:56 Wound Measurements Length: (cm) 0.3 Width: (cm) 0.4 Depth: (cm) 0.2 Area: (cm) 0.094 Volume: (cm) 0.019 % Reduction in Area: -100% % Reduction in Volume: -111.1% Epithelialization: None Tunneling: No Undermining: No Wound Description Classification: Partial Thickness Foul Odor Afte Wound Margin: Thickened Exudate Amount: Large Exudate Type: Serous Exudate Color: amber r Cleansing: No Wound Bed Granulation Amount: None Present (0%) Exposed Structure Necrotic Amount: Large (67-100%) Fascia Exposed: No Necrotic Quality: Adherent Slough Fat Layer Exposed: No [name], [name] ([id number]) Tendon Exposed: No Muscle Exposed: No Joint Exposed: No Bone Exposed: No  Limited to Skin Breakdown Periwound Skin Texture Texture Color No Abnormalities Noted: No No Abnormalities Noted: No Localized Edema: Yes Erythema: Yes Erythema Location: Circumferential Moisture No Abnormalities Noted: No Temperature / Pain Temperature: No Abnormality Tenderness on Palpation: Yes Wound Preparation Ulcer Cleansing: Rinsed/Irrigated with Saline Topical Anesthetic Applied: Other: lidocaine 4%, Treatment Notes Wound #2 (Left, Lateral Toe Fifth) 1. Cleansed with: Clean wound with Normal Saline 2. Anesthetic Topical Lidocaine 4% cream to wound bed prior to debridement 4. Dressing Applied: Prisma Ag 5. Secondary Dressing Applied Dry Gauze Foam Kerlix/Conform 7. Secured with Tape Notes netting Electronic Signature(s) Signed: 06/07/2016 4:22:14 PM By: Alric Quan Entered By: Alric Quan on 06/07/2016 11:06:24 [name]  ([id number]) -------------------------------------------------------------------------------- Vitals Details Patient Name: [name] Date of Service: 06/07/2016 10:45 AM Medical Record Patient Account Number: [id number] Number: Treating RN: Ahmed Prima 1918-11-15 (82 y.o. Other Clinician: Date of Birth/Sex: Female) Treating ROBSON, [place] Primary Care Physician: Lorelee Market Physician/Extender: G Referring Physician: Lorelee Market Weeks in Treatment: 8 Vital Signs Time Taken: 10:59 Temperature (F): 98.5 Height (in): 64 Pulse (bpm): 86 Weight (lbs): 158 Respiratory Rate (breaths/min): 17 Body Mass Index (BMI): 27.1 Blood Pressure (mmHg): 152/60 Reference Range: 80 - 120 mg / dl Electronic Signature(s) Signed: 06/07/2016 4:22:14 PM By: Alric Quan Entered By: Alric Quan on 06/07/2016 11:01:16

## 2016-06-14 ENCOUNTER — Ambulatory Visit (INDEPENDENT_AMBULATORY_CARE_PROVIDER_SITE_OTHER): Payer: Medicare PPO | Admitting: Cardiovascular Disease

## 2016-06-14 ENCOUNTER — Encounter: Payer: Self-pay | Admitting: Cardiovascular Disease

## 2016-06-14 VITALS — BP 134/58 | HR 76 | Ht 62.0 in | Wt 148.5 lb

## 2016-06-14 DIAGNOSIS — I739 Peripheral vascular disease, unspecified: Secondary | ICD-10-CM | POA: Diagnosis not present

## 2016-06-14 DIAGNOSIS — I10 Essential (primary) hypertension: Secondary | ICD-10-CM | POA: Diagnosis not present

## 2016-06-14 MED ORDER — NORTRIPTYLINE HCL 25 MG PO CAPS: 25.0000 mg | ORAL_CAPSULE | Freq: Every day | ORAL | 1 refills | Status: DC

## 2016-06-14 MED ORDER — CEPHALEXIN 500 MG PO CAPS: 500.0000 mg | ORAL_CAPSULE | Freq: Three times a day (TID) | ORAL | 0 refills | Status: DC

## 2016-06-14 NOTE — Patient Instructions (Signed)
Medication Instructions:  Your physician has recommended you make the following change in your medication:  START taking nortriptyline 25mg  at bedtime START taking keflex 500mg  three times a day for 5 days.    Labwork: none  Testing/Procedures: none  Follow-Up: Your physician recommends that you schedule a follow-up appointment in: one month with Dr. Fletcher Anon.    Any Other Special Instructions Will Be Listed Below (If Applicable).     If you need a refill on your cardiac medications before your next appointment, please call your pharmacy.

## 2016-06-14 NOTE — Progress Notes (Signed)
Cardiology Office Note   Date:  06/14/2016   ID:  Bijal Kiehl, DOB 01/18/1919, MRN [id number]  PCP:  Lorelee Market, MD  Cardiologist:   [name], MD   Chief Complaint  Patient presents with  . Other    1 month follow up. Pt. c/o bilateral leg pain with redness and rash. Meds reviewed by the patient's daughter.       History of Present Illness: [name] is a 82 y.o. female who Is here today for a follow-up visit regarding peripheral arterial disease and nonhealing ulcer on the left foot. The patient has no previous cardiac history and no history of diabetes or tobacco use. She is known to have hypertension, hyperlipidemia and hypothyroidism. She developed 2 ulcers on the left foot about 10 months ago and these has not healed in spite of seeing multiple providers.  She underwent noninvasive vascular evaluation in our office which showed an ABI of 0.55 on the right and 0.40 on the left. Duplex showed relatively long occlusion of the mid to distal left SFA with two-vessel runoff below the knee. I proceeded with angiography last month which showed no significant aortoiliac disease. There was long occlusion of the left SFA with 1 vessel runoff below the knee via the peroneal artery which was occluded but gave collaterals to the dorsalis pedis. I performed successful angioplasty of the left SFA followed by drug-coated balloon angioplasty and spot stenting with self-expanding stent in the midsegment. Postprocedure ABI showed improvement on the left side to 0.66. She continues to have monophasic waveform distally. There has been slow improvement in the wounds. She continues to have bilateral leg pain mostly at night when she is trying to sleep. She also developed some swelling of the left leg with redness.  Past Medical History:  Diagnosis Date  . Arthritis    "legs" 05/18/2016)  . Breast cancer, right breast ([place])    mastectomy 30 yr ago  . DVT (deep venous thrombosis)  ([place])    "found an old clot in her ?left leg when they were doing vascular studies; put her on ASA" (05/18/2016)  . Family history of adverse reaction to anesthesia    "all the family get PONV" (05/18/2016)  . Hypertension   . Hypothyroidism   . kidney calculi   . PAD (peripheral artery disease) ([place])   . PONV (postoperative nausea and vomiting)   . Shortness of breath     Past Surgical History:  Procedure Laterality Date  . BALLOON ANGIOPLASTY, ARTERY Left 05/18/2016   SFA/notes 05/18/2016  . BREAST BIOPSY Right   . CATARACT EXTRACTION W/ INTRAOCULAR LENS  IMPLANT, BILATERAL Bilateral 2014  . CYSTOSCOPY W/ LITHOLAPAXY / [facility]    . CYSTOSCOPY W/ URETERAL STENT PLACEMENT  01/17/2012   Procedure: CYSTOSCOPY WITH RETROGRADE PYELOGRAM/URETERAL STENT PLACEMENT;  Surgeon: Franchot Gallo, MD;  Location: [facility];  Service: Urology;  Laterality: Left;  . CYSTOSCOPY W/ URETERAL STENT PLACEMENT  11/10/2010   Archie Endo 11/10/2010  . CYSTOSCOPY W/ URETERAL STENT REMOVAL  11/22/2010   Archie Endo 11/22/2010  . EYE SURGERY    . FRACTURE SURGERY    . MASTECTOMY COMPLETE / SIMPLE Right   . PERIPHERAL VASCULAR CATHETERIZATION N/A 05/18/2016   Procedure: Abdominal Aortogram w/Lower Extremity;  Surgeon: Wellington Hampshire, MD;  Location: [facility];  Service: Cardiovascular;  Laterality: N/A;  . WRIST FRACTURE SURGERY Left      Current Outpatient Prescriptions  Medication Sig Dispense Refill  . amLODipine (NORVASC) 5  MG tablet Take 5 mg by mouth daily with breakfast.     . aspirin EC 81 MG tablet Take 81 mg by mouth daily.    . cetirizine (ZYRTEC) 10 MG tablet Take 10 mg by mouth daily.    . clopidogrel (PLAVIX) 75 MG tablet Take 1 tablet (75 mg total) by mouth daily with breakfast. 30 tablet 5  . levothyroxine (SYNTHROID, LEVOTHROID) 50 MCG tablet Take 50 mcg by mouth daily with breakfast.     . lidocaine-prilocaine (EMLA) cream Apply 1 application topically as needed. Dressing change    . losartan (COZAAR)  50 MG tablet Take 50 mg by mouth daily with breakfast.     . cephALEXin (KEFLEX) 500 MG capsule Take 1 capsule (500 mg total) by mouth 3 (three) times daily. 15 capsule 0  . nortriptyline (PAMELOR) 25 MG capsule Take 1 capsule (25 mg total) by mouth at bedtime. 30 capsule 1   No current facility-administered medications for this visit.     Allergies:   Codeine    Social History:  The patient  reports that she has never smoked. She has never used smokeless tobacco. She reports that she does not drink alcohol or use drugs.   Family History:  The patient's Family history is remarkable for coronary artery disease.   ROS:  Please see the history of present illness.   Otherwise, review of systems are positive for none.   All other systems are reviewed and negative.    PHYSICAL EXAM: VS:  BP (!) 134/58 (BP Location: Left Arm, Patient Position: Sitting, Cuff Size: Normal)   Pulse 76   Ht 5\' 2"  (1.575 m)   Wt 148 lb 8 oz (67.4 kg)   BMI 27.16 kg/m  , BMI Body mass index is 27.16 kg/m. GEN: Well nourished, well developed, in no acute distress  HEENT: normal  Neck: no JVD, carotid bruits, or masses Cardiac: RRR; no murmurs, rubs, or gallops,no edema  Respiratory:  clear to auscultation bilaterally, normal work of breathing GI: soft, nontender, nondistended, + BS MS: no deformity or atrophy  Skin: warm and dry, no rash Neuro:  Strength and sensation are intact Psych: euthymic mood, full affect Vascular: Femoral pulses are normal bilaterally. Distal pulses are not palpable. There is a small 3 x 3 mm superficial ulcer on the left small toe. There is a slightly beagle are sore at 4 x 4 millimeter superficial ulceration on the lateral ankle   EKG:  EKG is not ordered today.   Recent Labs: 05/16/2016: BUN 17; Potassium 3.5; Sodium 142 05/18/2016: Creatinine, Ser 0.63; Hemoglobin 12.6; Platelets 273    Lipid Panel No results found for: CHOL, TRIG, HDL, CHOLHDL, VLDL, LDLCALC, LDLDIRECT      Wt Readings from Last 3 Encounters:  06/14/16 148 lb 8 oz (67.4 kg)  05/19/16 155 lb 6.8 oz (70.5 kg)  02/06/12 158 lb (71.7 kg)      Other studies Reviewed: Additional studies/ records that were reviewed today include: Office note from the wound center.    ASSESSMENT AND PLAN:  1.  Critical limb ischemia with nonhealing ulcer on the left foot (Rutherford class V). Status post successful angioplasty to the left SFA with stent placement. ABI improved from 0.4 to 0.66. She still has significant below the knee disease with mainly one-vessel runoff. The wounds are healing slowly and I'm going to follow-up with her in another month to see if further intervention is needed on her tibial vessels. Continue Plavix for now.  She seems to be having a lot of neuropathic pain especially at night. Thus, I elected to start on small dose nortriptyline. There is also possible cellulitis affecting the left leg and I started her on a 5 day course of Keflex.   2. Essential hypertension: Blood pressure is controlled on current medications. Continue treatment with losartan and amlodipine.  Disposition:   FU with me in 1 month  Signed,  [name], MD  06/14/2016 5:40 PM     [facility]

## 2016-06-15 ENCOUNTER — Encounter: Payer: Medicare PPO | Attending: Internal Medicine | Admitting: Internal Medicine

## 2016-06-15 DIAGNOSIS — H269 Unspecified cataract: Secondary | ICD-10-CM | POA: Insufficient documentation

## 2016-06-15 DIAGNOSIS — I70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: Secondary | ICD-10-CM | POA: Diagnosis not present

## 2016-06-15 DIAGNOSIS — I10 Essential (primary) hypertension: Secondary | ICD-10-CM | POA: Diagnosis not present

## 2016-06-15 DIAGNOSIS — I70243 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: Secondary | ICD-10-CM | POA: Insufficient documentation

## 2016-06-15 DIAGNOSIS — L97523 Non-pressure chronic ulcer of other part of left foot with necrosis of muscle: Secondary | ICD-10-CM | POA: Insufficient documentation

## 2016-06-15 DIAGNOSIS — L97211 Non-pressure chronic ulcer of right calf limited to breakdown of skin: Secondary | ICD-10-CM | POA: Diagnosis not present

## 2016-06-16 NOTE — Progress Notes (Signed)
ALEINA, CONTANT ([id number]) Visit Report for 06/15/2016 Chief Complaint Document Details Patient Name: [name], [name] Date of Service: 06/15/2016 8:45 AM Medical Record Patient Account Number: [id number] Number: Treating RN: Carolyne Fiscal, Debi Sep 24, 1919 (82 y.o. Other Clinician: Date of Birth/Sex: Female) Treating Sion Thane Primary Care Physician/Extender: Derrill Memo, Meinder[place] Physician: Referring Physician: Lorelee Market Weeks in Treatment: 10 Information Obtained from: Patient Chief Complaint 04/06/16; the patient is here for review of wounds on the left lateral ankle and left 5th toe that been present for a year Electronic Signature(s) Signed: 06/16/2016 12:55:33 PM By: [name] MD Entered By: [name] on 06/15/2016 12:58:48 [name] ([id number]) -------------------------------------------------------------------------------- Debridement Details Patient Name: [name] Date of Service: 06/15/2016 8:45 AM Medical Record Patient Account Number: [id number] Number: Treating RN: Carolyne Fiscal, Debi [birth year] (82 y.o. Other Clinician: Date of Birth/Sex: Female) Treating Shirrell Solinger Primary Care Physician/Extender: Derrill Memo, [place] Physician: Referring Physician: Vinson Moselle in Treatment: 10 Debridement Performed for Wound #1 Left Malleolus Assessment: Performed By: Physician Ricard Dillon, MD Debridement: Debridement Pre-procedure Yes - 09:23 Verification/Time Out Taken: Start Time: 09:25 Pain Control: Lidocaine 4% Topical Solution Level: Skin/Subcutaneous Tissue Total Area Debrided (L x 0.4 (cm) x 0.7 (cm) = 0.28 (cm) W): Tissue and other Viable, Non-Viable, Exudate, Fibrin/Slough, Subcutaneous material debrided: Instrument: Curette Bleeding: Minimum Hemostasis Achieved: Pressure End Time: 09:27 Procedural Pain: 0 Post Procedural Pain: 0 Response to Treatment: Procedure was tolerated  well Post Debridement Measurements of Total Wound Length: (cm) 0.4 Width: (cm) 0.7 Depth: (cm) 0.2 Volume: (cm) 0.044 Character of Wound/Ulcer Post Stable Debridement: Severity of Tissue Post Debridement: Limited to breakdown of skin Post Procedure Diagnosis Same as Pre-procedure Electronic Signature(s) Signed: 06/15/2016 5:27:01 PM By: Myriam Jacobson, Estill Bamberg ([id number]) Signed: 06/16/2016 12:55:33 PM By: [name] MD Entered By: [name] on 06/15/2016 12:57:59 [name] ([id number]) -------------------------------------------------------------------------------- Debridement Details Patient Name: [name] Date of Service: 06/15/2016 8:45 AM Medical Record Patient Account Number: [id number] Number: Treating RN: Carolyne Fiscal, Debi 1919-06-18 (82 y.o. Other Clinician: Date of Birth/Sex: Female) Treating Garrette Caine Primary Care Physician/Extender: Derrill Memo, [place] Physician: Referring Physician: Lorelee Market Weeks in Treatment: 10 Debridement Performed for Wound #2 Left,Lateral Toe Fifth Assessment: Performed By: Physician Ricard Dillon, MD Debridement: Debridement Pre-procedure Yes - 09:23 Verification/Time Out Taken: Start Time: 09:23 Pain Control: Lidocaine 4% Topical Solution Level: Skin/Subcutaneous Tissue Total Area Debrided (L x 0.4 (cm) x 0.5 (cm) = 0.2 (cm) W): Tissue and other Viable, Non-Viable, Exudate, Fibrin/Slough, Subcutaneous material debrided: Instrument: Curette Bleeding: Minimum Hemostasis Achieved: Pressure End Time: 09:25 Procedural Pain: 0 Post Procedural Pain: 0 Response to Treatment: Procedure was tolerated well Post Debridement Measurements of Total Wound Length: (cm) 0.4 Width: (cm) 0.5 Depth: (cm) 0.2 Volume: (cm) 0.031 Character of Wound/Ulcer Post Stable Debridement: Severity of Tissue Post Debridement: Fat layer exposed Post Procedure Diagnosis Same as  Pre-procedure Electronic Signature(s) Signed: 06/15/2016 5:27:01 PM By: Myriam Jacobson, Estill Bamberg ([id number]) Signed: 06/16/2016 12:55:33 PM By: [name] MD Entered By: [name] on 06/15/2016 12:58:30 [name] ([id number]) -------------------------------------------------------------------------------- HPI Details Patient Name: [name] Date of Service: 06/15/2016 8:45 AM Medical Record Patient Account Number: [id number] Number: Treating RN: Carolyne Fiscal, Debi 1919-02-05 (82 y.o. Other Clinician: Date of Birth/Sex: Female) Treating Cordie Buening Primary Care Physician/Extender: Derrill Memo, [place] Physician: Referring Physician: Lorelee Market Weeks in Treatment: 10 History of Present Illness HPI Description: 04/06/16; this is a 82 year old woman who arrives accompanied by 2 daughters for a wound on her left ankle  and her left fifth toe. These have apparently been present for a year. I'm not quite certain how she came to this clinic however she was being followed by Sharlotte Alamo her podiatrist for these wounds. She was also referred to [facility] and they apparently did a test presumably arterial studies although we don't have any of these results and we couldn't get through to the office today. The family but has been applying a combination of Bactroban and a light bandage and perhaps more recently Silvadene cream. She did have an x-ray of the foot roughly 6 months ago at the podiatry office the family was unaware that if there were any abnormalities. Apparently they have not seen any healing here. Our intake nurse noted a slight skin tear on the right anterior lower leg. Nobody seemed aware of this. ABIs calculated in this clinic was 0.3 on the right and 0.4 on the left I have reviewed things [facility]. There is very little information on this patient. She apparently follows in current total clinic which we don't  have information from. She has mentioned already been to a [facility]. She has a history of hypothyroidism, nephrolithiasis arthritis and has had a previous mastectomy. 04/13/16; patient's x-ray was normal. She is already been to see Dr. Delana Meyer vascular surgery. Her arterial exam was from November 2016 this showed a left ABI of 0.62 her right of 0.76. Her duplex ultrasound of the left leg showed biphasic waves in the common femoral and distal femoral artery however monophasic waves in the superficial femoral artery proximal and mid biphasic it distal. 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 The patient tells me that she has pain at night when she tries to lie down which is improved by getting up and sitting in the chair this sounds like claudication at rest. Her wounds are on the left medial malleolus and the dorsal fifth toe small punched out wounds that are right on bone. We use Santyl last week 04/20/16: nurse informed me pt has declined evaluation for significant PAD. she denies systemic s/s of infections. 04/27/16;; the patient has had noninvasive arterial studies done in November 2016. ABI and the left was 0.62. Monophasic waves at the superficial femoral artery. Occluded to the posterior tibial artery. So had greater than 50% stenosis of the right superficial femoral and greater than 50% stenosis of the left superficial femoral artery. She had bilateral tibial peroneal artery disease. Both of the wounds on the left fifth toe and left lateral malleolus have been present for more than a year. They have been to see vein and vascular. The patient has some pain but miraculously I think the wounds have largely been stable. No evidence of infection 05/04/16; she goes for a noninvasive study tomorrow and then sees Dr. Fletcher Anon on Monday. By the time she is here next week we should have a better picture of whether something can be done with regards to her arterial flow. We continue to have ischemic-looking  wounds on the left fifth toe and left lateral malleolus. 05/10/16; the patient went for her arterial studies and saw Dr. Fletcher Anon. As predicted she is felt to have critical Morey, Odesser ([id number]) limb ischemia. The feeling is that she has occlusion of the SFA. The feeling would she would be a candidate for a stent to the SFA. The patient did not make a decision to proceed with the procedure and she is here with family members to discuss this me today. He shouldn't has a  lot of pain and cannot sleep and rest well at night per her family. 05/24/16; the patient went and had a complex revascularization/angioplasty of the left superficial femoral artery followed by drug-coated balloon angioplasty and spots stenting. She tolerated the procedure well. She was recommended for dual antiplatelet drugs with Plavix and aspirin for at least a month. She went yesterday for I believe follow-up serial Dopplers and ABIs although I don't see these results. The patient is unfortunately complaining of a lot of pain in her bilateral lower legs below the knees from the ankle to the knees. She apparently was prescribed lidocaine and apparently put this on her legs instead of over the wound areas. This may have something to do with it however there is a lot of edema in her bilateral legs I was able to find her arterial studies from yesterday. The left ABI has improved up to 0.66 post left SFA stent. The bilateral great toe indices remain abnormal with the left being in the 0.25 range. Duplex ultrasound showed her left SFA stent is patent monophasic waveforms persist in the left leg 06/07/16; continued punched-out areas over the dorsal left fifth toe and left lateral malleolus. No major improvement Electronic Signature(s) Signed: 06/16/2016 12:55:33 PM By: [name] MD Entered By: [name] on 06/15/2016 12:59:08 [name]  ([id number]) -------------------------------------------------------------------------------- Physical Exam Details Patient Name: [name] Date of Service: 06/15/2016 8:45 AM Medical Record Patient Account Number: [id number] Number: Treating RN: Ahmed Prima May 02, 1919 (82 y.o. Other Clinician: Date of Birth/Sex: Female) Treating Deno Sida Primary Care Physician/Extender: Derrill Memo, [place] Physician: Referring Physician: Lorelee Market Weeks in Treatment: 10 Constitutional Sitting or standing Blood Pressure is within target range for patient.. Pulse regular and within target range for patient.Marland Kitchen Respirations regular, non-labored and within target range.. Temperature is normal and within the target range for the patient.. Patient's appearance is neat and clean. Appears in no acute distress. Well nourished and well developed.. Notes Wound exam; the patient still has open areas over the left fifth toe dorsally as well as her left lateral malleolus in her ankle. The area on her left fifth toe looks improved the surface appears better and there is bleeding with debridement. The area over the left lateral malleolus does not have a viable surface and what I'm looking at may actually represent periosteum. Patient states her pain is under better control there is no evidence of infection Electronic Signature(s) Signed: 06/16/2016 12:55:33 PM By: [name] MD Entered By: [name] on 06/15/2016 13:02:22 [name] ([id number]) -------------------------------------------------------------------------------- Physician Orders Details Patient Name: [name] Date of Service: 06/15/2016 8:45 AM Medical Record Patient Account Number: [id number] Number: Treating RN: Ahmed Prima [birth year] (82 y.o. Other Clinician: Date of Birth/Sex: Female) Treating Abdulraheem Pineo Primary Care Physician/Extender: Derrill Memo,  [place] Physician: Referring Physician: Vinson Moselle in Treatment: 10 Verbal / Phone Orders: Yes Clinician: Carolyne Fiscal, Debi Read Back and Verified: Yes Diagnosis Coding Wound Cleansing Wound #1 Left Malleolus o Clean wound with Normal Saline. - for clinic use o Cleanse wound with mild soap and water o May Shower, gently pat wound dry prior to applying new dressing. Wound #2 Left,Lateral Toe Fifth o Clean wound with Normal Saline. - for clinic use o Cleanse wound with mild soap and water o May Shower, gently pat wound dry prior to applying new dressing. Anesthetic Wound #1 Left Malleolus o Topical Lidocaine 4% cream applied to wound bed prior to debridement - for clinic use Wound #2 Left,Lateral Toe Fifth   o Topical Lidocaine 4% cream applied to wound bed prior to debridement - for clinic use Primary Wound Dressing Wound #1 Left Malleolus o Iodoflex Wound #2 Left,Lateral Toe Fifth o Iodoflex Secondary Dressing Wound #1 Left Malleolus o Gauze and Kerlix/Conform - netting, tape o Foam Wound #2 Left,Lateral Toe Fifth o Gauze and Kerlix/Conform o Foam Hallums, Felina ([id number]) Dressing Change Frequency Wound #1 Left Malleolus o Change dressing every other day. Wound #2 Left,Lateral Toe Fifth o Change dressing every other day. Follow-up Appointments Wound #1 Left Malleolus o Return Appointment in 1 week. Wound #2 Left,Lateral Toe Fifth o Return Appointment in 1 week. Edema Control Wound #1 Left Malleolus o Elevate legs to the level of the heart and pump ankles as often as possible o Other: - compression stockings Wound #2 Left,Lateral Toe Fifth o Elevate legs to the level of the heart and pump ankles as often as possible o Other: - compression stockings Additional Orders / Instructions Wound #1 Left Malleolus o Increase protein intake. Wound #2 Left,Lateral Toe Fifth o Increase protein  intake. Medications-please add to medication list. Wound #1 Left Malleolus o Other: - Vitamin C, Zinc, Multivitamins Wound #2 Left,Lateral Toe Fifth o Other: - Vitamin C, Zinc, Multivitamins Tramadol Electronic Signature(s) Signed: 06/15/2016 5:27:01 PM By: Alric Quan Signed: 06/16/2016 12:55:33 PM By: [name] MD Entered By: Alric Quan on 06/15/2016 09:26:17 [name] ([id number]) -------------------------------------------------------------------------------- Problem List Details Patient Name: [name] Date of Service: 06/15/2016 8:45 AM Medical Record Patient Account Number: [id number] Number: Treating RN: Carolyne Fiscal, Debi December 20, 1918 (82 y.o. 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 Other Clinician: Date of Birth/Sex: Female) Treating Jakobee Brackins,  Nickoli Bagheri Primary Care Physician/Extender: Derrill Memo, [place] Physician: Referring Physician: Lorelee Market Weeks in Treatment: 10 Subjective Chief Complaint Information obtained from Patient 04/06/16; the patient is here for review of wounds on the left lateral ankle and left 5th toe that been present for a year History of Present Illness (HPI) 04/06/16; this is a 82 year old woman who arrives accompanied by 2 daughters for a wound on her left ankle and her left fifth toe. These have apparently been present for a year. I'm not quite certain how she came to this clinic however she was being followed by Sharlotte Alamo her podiatrist for these wounds. She was also referred to [facility] and they apparently did a test presumably arterial studies although we don't have any of these results and we couldn't get through to the office today. The family but has been applying a combination of Bactroban and a light bandage and perhaps more recently Silvadene cream. She did have an x-ray of the foot roughly 6 months ago at the  podiatry office the family was unaware that if there were any abnormalities. Apparently they have not seen any healing here. Our intake nurse noted a slight skin tear on the right anterior lower leg. Nobody seemed aware of this. ABIs calculated in this clinic was 0.3 on the right and 0.4 on the left I have reviewed things [facility]. There is very little information on this patient. She apparently follows in current total clinic which we don't have information from. She has mentioned already been to a [facility]. She has a history of hypothyroidism, nephrolithiasis arthritis and has had a previous mastectomy. 04/13/16; patient's x-ray was normal. She is already been to see Dr. Delana Meyer vascular surgery. Her arterial exam was from November 2016 this showed a left ABI of 0.62 her right of 0.76. Her duplex ultrasound of the left leg showed biphasic waves in the  common femoral and distal femoral artery however monophasic waves in the superficial femoral artery proximal and mid biphasic it distal. 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 The patient tells me that she has pain at night when she tries to lie down which is improved by getting up and sitting in the chair this sounds like claudication at rest. Her wounds are on the left medial malleolus and the dorsal fifth toe small punched out wounds that are right on bone. We use Santyl last week 04/20/16: nurse informed me pt has declined evaluation for significant PAD. she denies systemic s/s of infections. 04/27/16;; the patient has had noninvasive arterial studies done in November 2016. ABI and the left was 0.62. Monophasic waves at the superficial femoral artery. Occluded to the posterior tibial artery. So had greater than 50% stenosis of the right superficial femoral and greater than 50% stenosis of the left superficial femoral artery. She had bilateral tibial peroneal artery disease. Both of the wounds on the left Stangl, Solana ([id number]) fifth toe and left lateral malleolus have been present for more than a year. They have been to see vein and vascular. The patient has some pain but miraculously I think the wounds have largely been stable. No evidence of infection 05/04/16; she goes for a noninvasive study tomorrow and then sees Dr. Fletcher Anon on Monday. By the time she is here next week we should have a better picture of whether something can be done with regards to her arterial flow. We continue to have ischemic-looking wounds on the left fifth toe and left lateral malleolus. 05/10/16; the patient went for her arterial studies and saw Dr. Fletcher Anon. As predicted she is felt to have critical limb ischemia. The feeling is that she has occlusion of the SFA. The feeling would she would be a candidate for a stent to the SFA. The patient did not make a decision to proceed with the procedure and she is here  with family members to discuss this me today. He shouldn't has a lot of pain and cannot sleep and rest well at night per her family. 05/24/16; the patient went and had a complex revascularization/angioplasty of the left superficial femoral artery followed by drug-coated balloon angioplasty and spots stenting. She tolerated the procedure well. She was recommended for dual antiplatelet drugs with Plavix and aspirin for at least a month. She went yesterday for I believe follow-up serial Dopplers and ABIs although I don't see these results. The patient is unfortunately complaining of a lot of pain in her bilateral lower legs below the knees from the ankle to the knees. She apparently was prescribed lidocaine and  apparently put this on her legs instead of over the wound areas. This may have something to do with it however there is a lot of edema in her bilateral legs I was able to find her arterial studies from yesterday. The left ABI has improved up to 0.66 post left SFA stent. The bilateral great toe indices remain abnormal with the left being in the 0.25 range. Duplex ultrasound showed her left SFA stent is patent monophasic waveforms persist in the left leg 06/07/16; continued punched-out areas over the dorsal left fifth toe and left lateral malleolus. No major improvement Objective Constitutional Sitting or standing Blood Pressure is within target range for patient.. Pulse regular and within target range for patient.Marland Kitchen Respirations regular, non-labored and within target range.. Temperature is normal and within the target range for the patient.. Patient's appearance is neat and clean. Appears in no acute distress. Well nourished and well developed.. Vitals Time Taken: 9:03 AM, Height: 64 in, Weight: 158 lbs, BMI: 27.1, Temperature: 97.7 F, Pulse: 87 bpm, Respiratory Rate: 18 breaths/min, Blood Pressure: 140/45 mmHg. General Notes: Wound exam; the patient still has open areas over the left fifth  toe dorsally as well as her left lateral malleolus in her ankle. The area on her left fifth toe looks improved the surface appears better and there is bleeding with debridement. The area over the left lateral malleolus does not have a viable surface and what I'm looking at may actually represent periosteum. Patient states her pain is under better control there is no evidence of infection Integumentary (Hair, Skin) Raske, Donnae ([id number]) Wound #1 status is Open. Original cause of wound was Gradually Appeared. The wound is located on the Left Malleolus. The wound measures 0.4cm length x 0.7cm width x 0.2cm depth; 0.22cm^2 area and 0.044cm^3 volume. The wound is limited to skin breakdown. There is no tunneling or undermining noted. There is a large amount of serous drainage noted. The wound margin is thickened. There is no granulation within the wound bed. There is a large (67-100%) amount of necrotic tissue within the wound bed including Adherent Slough. The periwound skin appearance exhibited: Localized Edema, Erythema. The surrounding wound skin color is noted with erythema which is circumferential. Periwound temperature was noted as No Abnormality. The periwound has tenderness on palpation. Wound #2 status is Open. Original cause of wound was Gradually Appeared. The wound is located on the Left,Lateral Toe Fifth. The wound measures 0.4cm length x 0.5cm width x 0.2cm depth; 0.157cm^2 area and 0.031cm^3 volume. The wound is limited to skin breakdown. There is no tunneling or undermining noted. There is a large amount of serous drainage noted. The wound margin is thickened. There is no granulation within the wound bed. There is a large (67-100%) amount of necrotic tissue within the wound bed including Adherent Slough. The periwound skin appearance exhibited: Localized Edema, Erythema. The surrounding wound skin color is noted with erythema which is circumferential. Periwound temperature was  noted as No Abnormality. The periwound has tenderness on palpation. 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 There was a Skin/Subcutaneous Tissue Debridement [id number]) debridement  with total area of 0.28 sq cm performed by Ricard Dillon, MD. with the following instrument(s): Curette to remove Viable and Non-Viable tissue/material including Exudate, Fibrin/Slough, and Subcutaneous after achieving pain control using Lidocaine 4% Topical Solution. A time out was conducted at 09:23, prior to the start of the procedure. A Minimum amount of bleeding was controlled with Pressure. The procedure was tolerated well with a pain level of 0 throughout and a pain level of 0 following the procedure. Post Debridement Measurements: 0.4cm length x 0.7cm width x 0.2cm depth; 0.044cm^3 volume. Character of Wound/Ulcer Post Debridement is stable. Severity of Tissue Post Debridement is: Limited to breakdown of skin. 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 There was a Skin/Subcutaneous Tissue Debridement [id number]) debridement with total area of 0.2 sq cm performed by Ricard Dillon, MD. with the following instrument(s): Curette to remove Viable and Non-Viable tissue/material including Exudate, Fibrin/Slough, and Subcutaneous after achieving pain control using Lidocaine 4% Topical Solution. A time out was conducted at 09:23, prior to  the start of the procedure. A Minimum amount of bleeding was controlled with Pressure. The procedure was tolerated well with a pain level of 0 throughout and a pain level of 0 following the procedure. Post Debridement Measurements: 0.4cm length x 0.5cm width x 0.2cm depth; 0.031cm^3 volume. Character of Wound/Ulcer Post Debridement is stable. Severity of Tissue Post Debridement is: Fat layer exposed. Post procedure Diagnosis Wound #2: Same as Pre-Procedure Plan Wound Cleansing: Wound #1 Left Malleolus: Clean wound with Normal Saline. - for clinic use Cleanse wound with mild soap and water May Shower, gently pat wound dry prior to applying new dressing. Wound #2 Left,Lateral Toe Fifth: Clean wound with Normal Saline. - for clinic use Cleanse wound with mild soap and water May Shower, gently pat wound dry prior to applying new dressing. Anesthetic: Wound #1 Left Malleolus: Topical Lidocaine 4% cream applied to wound bed prior to debridement - for clinic use Wound #2 Left,Lateral Toe Fifth: Topical Lidocaine 4% cream applied to wound bed prior to debridement - for clinic use Primary Wound Dressing: Wound #1 Left Malleolus: Iodoflex Wound #2 Left,Lateral Toe Fifth: Iodoflex Secondary Dressing: Wound #1 Left Malleolus: Gauze and Kerlix/Conform - netting, tape Foam Wound #2 Left,Lateral Toe Fifth: Gauze and Kerlix/Conform Foam Dressing Change Frequency: Wound #1 Left Malleolus: Mccardle, Madeline ([id number]) Change dressing every other day. Wound #2 Left,Lateral Toe Fifth: Change dressing every other day. Follow-up Appointments: Wound #1 Left Malleolus: Return Appointment in 1 week. Wound #2 Left,Lateral Toe Fifth: Return Appointment in 1 week. Edema Control: Wound #1 Left Malleolus: Elevate legs to the level of the heart and pump ankles as often as possible Other: - compression stockings Wound #2 Left,Lateral Toe Fifth: Elevate legs to the level of the heart and pump ankles  as often as possible Other: - compression stockings Additional Orders / Instructions: Wound #1 Left Malleolus: Increase protein intake. Wound #2 Left,Lateral Toe Fifth: Increase protein intake. Medications-please add to medication list.: Wound #1 Left Malleolus: Other: - Vitamin C, Zinc, Multivitamins Wound #2 Left,Lateral Toe Fifth: Other: - Vitamin C, Zinc, Multivitamins Tramadol #1 I have change the dressing toe Iodoflex this see if we can maintain some form of nonviable surface on this #2 the area over her left fifth toe actually looks better post revascularization. The area over the left lateral malleolus really doesn't and I wonder whether this  is going to be something she is going to need to live with Electronic Signature(s) Signed: 06/16/2016 12:55:33 PM By: [name] MD Entered By: [name] on 06/15/2016 13:03:21 [name] ([id number]) -------------------------------------------------------------------------------- SuperBill Details Patient Name: [name] Date of Service: 06/15/2016 Medical Record Patient Account Number: [id number] Number: Treating RN: Ahmed Prima 19-Aug-1919 (82 y.o. 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

## 2016-06-16 NOTE — Progress Notes (Signed)
[name], [name] ([id number]) Visit Report for 06/15/2016 Arrival Information Details Patient Name: [name], [name] Date of Service: 06/15/2016 8:45 AM Medical Record Patient Account Number: [id number] Number: Treating RN: Ahmed Prima [birth year] (82 y.o. Other Clinician: Date of Birth/Sex: Female) Treating ROBSON, [place] Primary Care Physician: Lorelee Market Physician/Extender: G Referring Physician: Vinson Moselle in Treatment: 10 Visit Information History Since Last Visit All ordered tests and consults were completed: No Patient Arrived: Ambulatory Added or deleted any medications: No Arrival Time: 09:02 Any new allergies or adverse reactions: No Accompanied By: daughters Had a fall or experienced change in No Transfer Assistance: None activities of daily living that may affect Patient Identification Verified: Yes risk of falls: Secondary Verification Process Yes Signs or symptoms of abuse/neglect since last No Completed: visito Patient Requires Transmission- No Hospitalized since last visit: No Based Precautions: Pain Present Now: No Patient Has Alerts: Yes Patient Alerts: 7/31 ABI: R-0.55 L-0.40 [facility] Electronic Signature(s) Signed: 06/15/2016 5:27:01 PM By: Alric Quan Entered By: Alric Quan on 06/15/2016 09:03:07 [name] ([id number]) -------------------------------------------------------------------------------- Encounter Discharge Information Details Patient Name: [name] Date of Service: 06/15/2016 8:45 AM Medical Record Patient Account Number: [id number] Number: Treating RN: Carolyne Fiscal, Debi 1919/09/17 (82 y.o. Other Clinician: Date of Birth/Sex: Female) Treating ROBSON, [place] Primary Care Physician: Lorelee Market Physician/Extender: G Referring Physician: Vinson Moselle in Treatment: 10 Encounter Discharge Information Items Discharge Pain Level: 0 Discharge  Condition: Stable Ambulatory Status: Ambulatory Discharge Destination: Home Transportation: Private Auto Accompanied By: daugters Schedule Follow-up Appointment: Yes Medication Reconciliation completed and provided to Patient/Care Yes Treyshawn Muldrew: Provided on Clinical Summary of Care: 06/15/2016 Form Type Recipient Paper Patient [facility] Electronic Signature(s) Signed: 06/15/2016 9:39:32 AM By: Ruthine Dose Entered By: Ruthine Dose on 06/15/2016 09:39:32 [name] ([id number]) -------------------------------------------------------------------------------- Lower Extremity Assessment Details Patient Name: [name] Date of Service: 06/15/2016 8:45 AM Medical Record Patient Account Number: [id number] Number: Treating RN: Ahmed Prima 1919-08-04 (82 y.o. Other Clinician: Date of Birth/Sex: Female) Treating ROBSON, [place] Primary Care Physician: Lorelee Market Physician/Extender: G Referring Physician: Lorelee Market Weeks in Treatment: 10 Vascular Assessment Pulses: Posterior Tibial Dorsalis Pedis Palpable: [Left:Yes] Extremity colors, hair growth, and conditions: Extremity Color: [Left:Normal] Temperature of Extremity: [Left:Warm] Capillary Refill: [Left:< 3 seconds] Toe Nail Assessment Left: Right: Thick: No Discolored: No Deformed: No Improper Length and Hygiene: No Electronic Signature(s) Signed: 06/15/2016 5:27:01 PM By: Alric Quan Entered By: Alric Quan on 06/15/2016 09:05:40 [name] ([id number]) -------------------------------------------------------------------------------- Multi Wound Chart Details Patient Name: [name] Date of Service: 06/15/2016 8:45 AM Medical Record Patient Account Number: [id number] Number: Treating RN: Carolyne Fiscal, Debi April 28, 1919 (82 y.o. Other Clinician: Date of Birth/Sex: Female) Treating ROBSON, [place] Primary Care Physician: Lorelee Market Physician/Extender:  G Referring Physician: Lorelee Market Weeks in Treatment: 10 Vital Signs Height(in): 64 Pulse(bpm): 87 Weight(lbs): 158 Blood Pressure 140/45 (mmHg): Body Mass Index(BMI): 27 Temperature(F): 97.7 Respiratory Rate 18 (breaths/min): Photos: [1:No Photos] [2:No Photos] [N/A:N/A] Wound Location: [1:Left Malleolus] [2:Left Toe Fifth - Lateral] [N/A:N/A] Wounding Event: [1:Gradually Appeared] [2:Gradually Appeared] [N/A:N/A] Primary Etiology: [1:Arterial Insufficiency Ulcer Arterial Insufficiency Ulcer N/A] Comorbid History: [1:Cataracts, Hypertension, Cataracts, Hypertension, N/A Osteoarthritis] [2:Osteoarthritis] Date Acquired: [1:04/07/2015] [2:04/07/2015] [N/A:N/A] Weeks of Treatment: [1:10] [2:10] [N/A:N/A] Wound Status: [1:Open] [2:Open] [N/A:N/A] Measurements L x W x D 0.4x0.7x0.2 [2:0.4x0.5x0.2] [N/A:N/A] (cm) Area (cm) : [1:0.22] [2:0.157] [N/A:N/A] Volume (cm) : [1:0.044] [2:0.031] [N/A:N/A] % Reduction in Area: [1:42.90%] [2:-234.00%] [N/A:N/A] % Reduction in Volume: 42.90% [2:-244.40%] [N/A:N/A] Classification: [1:Partial Thickness] [2:Partial Thickness] [N/A:N/A] Exudate Amount: [1:Large] [2:Large] [N/A:N/A]  Exudate Type: [1:Serous] [2:Serous] [N/A:N/A] Exudate Color: [1:amber] [2:amber] [N/A:N/A] Wound Margin: [1:Thickened] [2:Thickened] [N/A:N/A] Granulation Amount: [1:None Present (0%)] [2:None Present (0%)] [N/A:N/A] Necrotic Amount: [1:Large (67-100%)] [2:Large (67-100%)] [N/A:N/A] Exposed Structures: [1:Fascia: No Fat: No Tendon: No Muscle: No Joint: No Bone: No] [2:Fascia: No Fat: No Tendon: No Muscle: No Joint: No Bone: No] [N/A:N/A] Limited to Skin Limited to Skin Breakdown Breakdown Epithelialization: None None N/A Periwound Skin Texture: Edema: Yes Edema: Yes N/A Periwound Skin No Abnormalities Noted No Abnormalities Noted N/A Moisture: Periwound Skin Color: Erythema: Yes Erythema: Yes N/A Erythema Location: Circumferential Circumferential  N/A Temperature: No Abnormality No Abnormality N/A Tenderness on Yes Yes N/A Palpation: Wound Preparation: Ulcer Cleansing: Ulcer Cleansing: N/A Rinsed/Irrigated with Rinsed/Irrigated with Saline Saline Topical Anesthetic Topical Anesthetic Applied: Other: lidocaine Applied: Other: lidocaine 4% 4% Treatment Notes Electronic Signature(s) Signed: 06/15/2016 5:27:01 PM By: Alric Quan Entered By: Alric Quan on 06/15/2016 09:14:18 [name] ([id number]) -------------------------------------------------------------------------------- Multi-Disciplinary Care Plan Details Patient Name: [name] Date of Service: 06/15/2016 8:45 AM Medical Record Patient Account Number: [id number] Number: Treating RN: Ahmed Prima 12-Nov-1918 (82 y.o. Other Clinician: Date of Birth/Sex: Female) Treating ROBSON, [place] Primary Care Physician: Lorelee Market Physician/Extender: G Referring Physician: Vinson Moselle in Treatment: 10 Active Inactive Abuse / Safety / Falls / Self Care Management Nursing Diagnoses: Potential for falls Goals: Patient will remain injury free Date Initiated: 04/06/2016 Goal Status: Active Interventions: Assess fall risk on admission and as needed Notes: Nutrition Nursing Diagnoses: Imbalanced nutrition Goals: Patient/caregiver agrees to and verbalizes understanding of need to use nutritional supplements and/or vitamins as prescribed Date Initiated: 04/06/2016 Goal Status: Active Interventions: Assess patient nutrition upon admission and as needed per policy Notes: Orientation to the Wound Care Program Nursing Diagnoses: Knowledge deficit related to the wound healing center program MATY, MACHUCA ([id number]) Goals: Patient/caregiver will verbalize understanding of the [place] Date Initiated: 04/06/2016 Goal Status: Active Interventions: Provide education on orientation to the wound  center Notes: Pain, Acute or Chronic Nursing Diagnoses: Pain, acute or chronic: actual or potential Potential alteration in comfort, pain Goals: Patient will verbalize adequate pain control and receive pain control interventions during procedures as needed Date Initiated: 04/06/2016 Goal Status: Active Interventions: Assess comfort goal upon admission Complete pain assessment as per visit requirements Notes: Wound/Skin Impairment Nursing Diagnoses: Impaired tissue integrity Goals: Ulcer/skin breakdown will have a volume reduction of 30% by week 4 Date Initiated: 04/06/2016 Goal Status: Active Ulcer/skin breakdown will have a volume reduction of 50% by week 8 Date Initiated: 04/06/2016 Goal Status: Active Ulcer/skin breakdown will have a volume reduction of 80% by week 12 Date Initiated: 04/06/2016 Goal Status: Active Interventions: Assess patient/caregiver ability to obtain necessary supplies Wickliffe, Naasia ([id number]) Assess ulceration(s) every visit Notes: Electronic Signature(s) Signed: 06/15/2016 5:27:01 PM By: Alric Quan Entered By: Alric Quan on 06/15/2016 09:13:33 [name] ([id number]) -------------------------------------------------------------------------------- Pain Assessment Details Patient Name: [name] Date of Service: 06/15/2016 8:45 AM Medical Record Patient Account Number: [id number] Number: Treating RN: Ahmed Prima 11-06-1918 (82 y.o. Other Clinician: Date of Birth/Sex: Female) Treating ROBSON, [place] Primary Care Physician: Lorelee Market Physician/Extender: G Referring Physician: Lorelee Market Weeks in Treatment: 10 Active Problems Location of Pain Severity and Description of Pain Patient Has Paino No Site Locations With Dressing Change: No Pain Management and Medication Current Pain Management: Electronic Signature(s) Signed: 06/15/2016 5:27:01 PM By: Alric Quan Entered By: Alric Quan on 06/15/2016 [place], Avyn ([id number]) -------------------------------------------------------------------------------- Patient/Caregiver Education Details Patient Name: [name] Date  of Service: 06/15/2016 8:45 AM Medical Record Patient Account Number: [id number] Number: Treating RN: Ahmed Prima 12-Feb-1919 (82 y.o. Other Clinician: Date of Birth/Gender: Female) Treating ROBSON, [place] Primary Care Physician: Lorelee Market Physician/Extender: G Referring Physician: Vinson Moselle in Treatment: 10 Education Assessment Education Provided To: Patient Education Topics Provided Wound/Skin Impairment: Handouts: Other: change dressing as ordered Methods: Demonstration, Explain/Verbal Responses: State content correctly Electronic Signature(s) Signed: 06/15/2016 5:27:01 PM By: Alric Quan Entered By: Alric Quan on 06/15/2016 09:18:34 [name] ([id number]) -------------------------------------------------------------------------------- Wound Assessment Details Patient Name: [name] Date of Service: 06/15/2016 8:45 AM Medical Record Patient Account Number: [id number] Number: Treating RN: Ahmed Prima 11/24/1918 (82 y.o. Other Clinician: Date of Birth/Sex: Female) Treating ROBSON, [place] Primary Care Physician: Lorelee Market Physician/Extender: G Referring Physician: Lorelee Market Weeks in Treatment: 10 Wound Status Wound Number: 1 Primary Arterial Insufficiency Ulcer Etiology: Wound Location: Left Malleolus Wound Status: Open Wounding Event: Gradually Appeared Comorbid Cataracts, Hypertension, Date Acquired: 04/07/2015 History: Osteoarthritis Weeks Of Treatment: 10 Clustered Wound: No Photos Photo Uploaded By: Alric Quan on 06/15/2016 17:20:37 Wound Measurements Length: (cm) 0.4 % Reduction in Width: (cm) 0.7 % Reduction in Depth: (cm) 0.2 Epithelializati Area:  (cm) 0.22 Tunneling: Volume: (cm) 0.044 Undermining: Area: 42.9% Volume: 42.9% on: None No No Wound Description Classification: Partial Thickness Foul Odor Afte Wound Margin: Thickened Exudate Amount: Large Exudate Type: Serous Exudate Color: amber r Cleansing: No Wound Bed Granulation Amount: None Present (0%) Exposed Structure Necrotic Amount: Large (67-100%) Fascia Exposed: No Necrotic Quality: Adherent Slough Fat Layer Exposed: No Meineke, Shatira ([id number]) Tendon Exposed: No Muscle Exposed: No Joint Exposed: No Bone Exposed: No Limited to Skin Breakdown Periwound Skin Texture Texture Color No Abnormalities Noted: No No Abnormalities Noted: No Localized Edema: Yes Erythema: Yes Erythema Location: Circumferential Moisture No Abnormalities Noted: No Temperature / Pain Temperature: No Abnormality Tenderness on Palpation: Yes Wound Preparation Ulcer Cleansing: Rinsed/Irrigated with Saline Topical Anesthetic Applied: Other: lidocaine 4%, Treatment Notes Wound #1 (Left Malleolus) 1. Cleansed with: Clean wound with Normal Saline 3. Peri-wound Care: Barrier cream 4. Dressing Applied: Iodoflex 5. Secondary Dressing Applied Dry Gauze Foam Kerlix/Conform 7. Secured with Tape Notes netting Electronic Signature(s) Signed: 06/15/2016 5:27:01 PM By: Alric Quan Entered By: Alric Quan on 06/15/2016 09:11:21 [name] ([id number]) -------------------------------------------------------------------------------- Wound Assessment Details Patient Name: [name] Date of Service: 06/15/2016 8:45 AM Medical Record Patient Account Number: [id number] Number: Treating RN: Ahmed Prima 16-Aug-1919 (82 y.o. Other Clinician: Date of Birth/Sex: Female) Treating ROBSON, [place] Primary Care Physician: Lorelee Market Physician/Extender: G Referring Physician: Lorelee Market Weeks in Treatment: 10 Wound Status Wound Number:  2 Primary Arterial Insufficiency Ulcer Etiology: Wound Location: Left Toe Fifth - Lateral Wound Status: Open Wounding Event: Gradually Appeared Comorbid Cataracts, Hypertension, Date Acquired: 04/07/2015 History: Osteoarthritis Weeks Of Treatment: 10 Clustered Wound: No Photos Photo Uploaded By: Alric Quan on 06/15/2016 17:20:38 Wound Measurements Length: (cm) 0.4 Width: (cm) 0.5 Depth: (cm) 0.2 Area: (cm) 0.157 Volume: (cm) 0.031 % Reduction in Area: -234% % Reduction in Volume: -244.4% Epithelialization: None Tunneling: No Undermining: No Wound Description Classification: Partial Thickness Foul Odor Afte Wound Margin: Thickened Exudate Amount: Large Exudate Type: Serous Exudate Color: amber r Cleansing: No Wound Bed Granulation Amount: None Present (0%) Exposed Structure Necrotic Amount: Large (67-100%) Fascia Exposed: No Necrotic Quality: Adherent Slough Fat Layer Exposed: No Koogler, Keilin ([id number]) Tendon Exposed: No Muscle Exposed: No Joint Exposed: No Bone Exposed: No Limited to Skin Breakdown Periwound Skin Texture Texture Color No Abnormalities Noted:  No No Abnormalities Noted: No Localized Edema: Yes Erythema: Yes Erythema Location: Circumferential Moisture No Abnormalities Noted: No Temperature / Pain Temperature: No Abnormality Tenderness on Palpation: Yes Wound Preparation Ulcer Cleansing: Rinsed/Irrigated with Saline Topical Anesthetic Applied: Other: lidocaine 4%, Treatment Notes Wound #2 (Left, Lateral Toe Fifth) 1. Cleansed with: Clean wound with Normal Saline 3. Peri-wound Care: Barrier cream 4. Dressing Applied: Iodoflex 5. Secondary Dressing Applied Dry Gauze Foam Kerlix/Conform 7. Secured with Tape Notes netting Electronic Signature(s) Signed: 06/15/2016 5:27:01 PM By: Alric Quan Entered By: Alric Quan on 06/15/2016 09:11:52 [name]  ([id number]) -------------------------------------------------------------------------------- [place] Details Patient Name: [name] Date of Service: 06/15/2016 8:45 AM Medical Record Patient Account Number: [id number] Number: Treating RN: Ahmed Prima 1919-03-16 (82 y.o. Other Clinician: Date of Birth/Sex: Female) Treating ROBSON, [place] Primary Care Physician: Lorelee Market Physician/Extender: G Referring Physician: Lorelee Market Weeks in Treatment: 10 Vital Signs Time Taken: 09:03 Temperature (F): 97.7 Height (in): 64 Pulse (bpm): 87 Weight (lbs): 158 Respiratory Rate (breaths/min): 18 Body Mass Index (BMI): 27.1 Blood Pressure (mmHg): 140/45 Reference Range: 80 - 120 mg / dl Electronic Signature(s) Signed: 06/15/2016 5:27:01 PM By: Alric Quan Entered By: Alric Quan on 06/15/2016 09:03:48

## 2016-06-21 ENCOUNTER — Ambulatory Visit: Payer: Medicare PPO | Admitting: Cardiovascular Disease

## 2016-06-21 ENCOUNTER — Ambulatory Visit: Payer: Medicare PPO | Admitting: Internal Medicine

## 2016-06-28 ENCOUNTER — Encounter: Payer: Medicare PPO | Admitting: Internal Medicine

## 2016-06-28 DIAGNOSIS — I70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: Secondary | ICD-10-CM | POA: Diagnosis not present

## 2016-06-28 NOTE — Progress Notes (Signed)
[name], [name] ([id number]) Visit Report for 06/28/2016 Chief Complaint Document Details Patient Name: [name], [name] Date of Service: 06/28/2016 10:45 AM Medical Record Patient Account Number: [id number] Number: Treating RN: Ahmed Prima 1919/08/31 (82 y.o. Other Clinician: Date of Birth/Sex: Female) Treating Marlan Steward Primary Care Physician/Extender: Derrill Memo, [place] Physician: Referring Physician: Vinson Moselle in Treatment: 11 Information Obtained from: Patient Chief Complaint 04/06/16; the patient is here for review of wounds on the left lateral ankle and left 5th toe that been present for a year Electronic Signature(s) Signed: 06/28/2016 3:24:48 PM By: [name] MD Entered By: [name] on 06/28/2016 11:15:03 [name] ([id number]) -------------------------------------------------------------------------------- HPI Details Patient Name: [name] Date of Service: 06/28/2016 10:45 AM Medical Record Patient Account Number: [id number] Number: Treating RN: Carolyne Fiscal, Debi 04/01/1919 (82 y.o. Other Clinician: Date of Birth/Sex: Female) Treating Keaten Mashek Primary Care Physician/Extender: Derrill Memo, [place] Physician: Referring Physician: Lorelee Market Weeks in Treatment: 11 History of Present Illness HPI Description: 04/06/16; this is a 82 year old woman who arrives accompanied by 2 daughters for a wound on her left ankle and her left fifth toe. These have apparently been present for a year. I'm not quite certain how she came to this clinic however she was being followed by Sharlotte Alamo her podiatrist for these wounds. She was also referred to [facility] and they apparently did a test presumably arterial studies although we don't have any of these results and we couldn't get through to the office today. The family but has been applying a combination of Bactroban and a light bandage  and perhaps more recently Silvadene cream. She did have an x-ray of the foot roughly 6 months ago at the podiatry office the family was unaware that if there were any abnormalities. Apparently they have not seen any healing here. Our intake nurse noted a slight skin tear on the right anterior lower leg. Nobody seemed aware of this. ABIs calculated in this clinic was 0.3 on the right and 0.4 on the left I have reviewed things [facility]. There is very little information on this patient. She apparently follows in current total clinic which we don't have information from. She has mentioned already been to a [facility]. She has a history of hypothyroidism, nephrolithiasis arthritis and has had a previous mastectomy. 04/13/16; patient's x-ray was normal. She is already been to see Dr. Delana Meyer vascular surgery. Her arterial exam was from November 2016 this showed a left ABI of 0.62 her right of 0.76. Her duplex ultrasound of the left leg showed biphasic waves in the common femoral and distal femoral artery however monophasic waves in the superficial femoral artery proximal and mid biphasic it distal. 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 The patient tells me that she has pain at night when she tries to lie down which is improved by getting up and sitting in the chair this sounds like claudication at rest. Her wounds are on the left medial malleolus and the dorsal fifth toe small punched out wounds that are right on bone. We use Santyl last week 04/20/16: nurse informed me pt has declined evaluation for significant PAD. she denies systemic s/s of infections. 04/27/16;; the patient has had noninvasive arterial studies done in November 2016. ABI and the left was 0.62. Monophasic waves at the superficial femoral artery. Occluded to the posterior tibial artery. So had greater than 50% stenosis of the right superficial femoral and greater than 50% stenosis of the left superficial femoral artery. She had  bilateral tibial peroneal artery disease. Both of the wounds on the left fifth toe and left lateral malleolus have been present for more than a year. They have been to see vein and vascular. The patient has some pain but miraculously I think the wounds have largely been stable. No evidence of infection 05/04/16; she goes for a noninvasive study tomorrow and then sees Dr. Fletcher Anon on Monday. By the time she is here next week we should have a better picture of whether something can be done with regards to her arterial flow. We continue to have ischemic-looking wounds on the left fifth toe and left lateral malleolus. 05/10/16; the patient went for her arterial studies and saw Dr. Fletcher Anon. As predicted she is felt to have critical [name], [name] ([id number]) limb ischemia. The feeling is that she has occlusion of the SFA. The feeling would she would be a candidate for a stent to the SFA. The patient did not make a decision to proceed with the procedure and she is here with family members to discuss this me today. He shouldn't has a lot of pain and cannot sleep and rest well at night per her family. 05/24/16; the patient went and had a complex revascularization/angioplasty of the left superficial femoral artery followed by drug-coated balloon angioplasty and spots stenting. She tolerated the procedure well. She was recommended for dual antiplatelet drugs with Plavix and aspirin for at least a month. She went yesterday for I believe follow-up serial Dopplers and ABIs although I don't see these results. The patient is unfortunately complaining of a lot of pain in her bilateral lower legs below the knees from the ankle to the knees. She apparently was prescribed lidocaine and apparently put this on her legs instead of over the wound areas. This may have something to do with it however there is a lot of edema in her bilateral legs I was able to find her arterial studies from yesterday. The left ABI has improved  up to 0.66 post left SFA stent. The bilateral great toe indices remain abnormal with the left being in the 0.25 range. Duplex ultrasound showed her left SFA stent is patent monophasic waveforms persist in the left leg 06/07/16; continued punched-out areas over the dorsal left fifth toe and left lateral malleolus. No major improvement 06/28/16; the areas over her dorsal left fifth toe and left lateral malleolus are covered in surface slough we are using Iodoflex Electronic Signature(s) Signed: 06/28/2016 3:24:48 PM By: [name] MD Entered By: [name] on 06/28/2016 11:15:56 [name] ([id number]) -------------------------------------------------------------------------------- Physical Exam Details Patient Name: [name] Date of Service: 06/28/2016 10:45 AM Medical Record Patient Account Number: [id number] Number: Treating RN: Carolyne Fiscal, Debi 1919/02/14 (82 y.o. Other Clinician: Date of Birth/Sex: Female) Treating Lanett Lasorsa Primary Care Physician/Extender: Derrill Memo, [place] Physician: Referring Physician: Lorelee Market Weeks in Treatment: 11 Constitutional Sitting or standing Blood Pressure is within target range for patient.. Pulse regular and within target range for patient.Marland Kitchen Respirations regular, non-labored and within target range.. Temperature is normal and within the target range for the patient.. Patient's appearance is neat and clean. Appears in no acute distress. Well nourished and well developed.Marland Kitchen Respiratory Respiratory effort is easy and symmetric bilaterally. Rate is normal at rest and on room air.. Cardiovascular Pulses are absent.. Lymphatic Nonpalpable in the popliteal or inguinal area. Psychiatric No evidence of depression, anxiety, or agitation. Calm, cooperative, and communicative. Appropriate interactions and affect.. Notes Wound exam; both the area on the dorsal aspect of her left  fifth toe on the left lateral  malleolus of a gelatinous surface slough over them. The area over the malleolus looks somewhat smaller. I elected not to debridement this today I'll allow the Iodosorb to continue. She did not have a heeling surface over the malleolus the last time I debrided this. My feeling at the time as this may be a wound she is going to need to live with Electronic Signature(s) Signed: 06/28/2016 3:24:48 PM By: [name] MD Entered By: [name] on 06/28/2016 11:18:51 [name] ([id number]) -------------------------------------------------------------------------------- Physician Orders Details Patient Name: [name] Date of Service: 06/28/2016 10:45 AM Medical Record Patient Account Number: [id number] Number: Treating RN: Baruch Gouty, RN, BSN, Rita August 20, 1919 (82 y.o. Other Clinician: Date of Birth/Sex: Female) Treating Hafiz Irion Primary Care Physician/Extender: Derrill Memo, [place] Physician: Referring Physician: Vinson Moselle in Treatment: 11 Verbal / Phone Orders: Yes Clinician: Afful, RN, BSN, Rita Read Back and Verified: Yes Diagnosis Coding Wound Cleansing Wound #1 Left Malleolus o Clean wound with Normal Saline. - for clinic use o Cleanse wound with mild soap and water o May Shower, gently pat wound dry prior to applying new dressing. Wound #2 Left,Lateral Toe Fifth o Clean wound with Normal Saline. - for clinic use o Cleanse wound with mild soap and water o May Shower, gently pat wound dry prior to applying new dressing. Anesthetic Wound #1 Left Malleolus o Topical Lidocaine 4% cream applied to wound bed prior to debridement - for clinic use Wound #2 Left,Lateral Toe Fifth o Topical Lidocaine 4% cream applied to wound bed prior to debridement - for clinic use Primary Wound Dressing Wound #1 Left Malleolus o Iodoflex Wound #2 Left,Lateral Toe Fifth o Iodoflex Secondary Dressing Wound #1 Left Malleolus o  Gauze and Kerlix/Conform - netting, tape o Foam Wound #2 Left,Lateral Toe Fifth o Gauze and Kerlix/Conform o Foam [name], [name] ([id number]) Dressing Change Frequency Wound #1 Left Malleolus o Change dressing every other day. Wound #2 Left,Lateral Toe Fifth o Change dressing every other day. Follow-up Appointments Wound #1 Left Malleolus o Return Appointment in 1 week. Wound #2 Left,Lateral Toe Fifth o Return Appointment in 1 week. Edema Control Wound #1 Left Malleolus o Elevate legs to the level of the heart and pump ankles as often as possible o Other: - compression stockings Wound #2 Left,Lateral Toe Fifth o Elevate legs to the level of the heart and pump ankles as often as possible o Other: - compression stockings Additional Orders / Instructions Wound #1 Left Malleolus o Increase protein intake. Wound #2 Left,Lateral Toe Fifth o Increase protein intake. Medications-please add to medication list. Wound #1 Left Malleolus o Other: - Vitamin C, Zinc, Multivitamins Wound #2 Left,Lateral Toe Fifth o Other: - Vitamin C, Zinc, Multivitamins Tramadol Electronic Signature(s) Signed: 06/28/2016 3:24:48 PM By: [name] MD Signed: 06/28/2016 4:32:24 PM By: Regan Lemming BSN, RN Entered By: Regan Lemming on 06/28/2016 11:08:46 [name] ([id number]) -------------------------------------------------------------------------------- Problem List Details Patient Name: [name] Date of Service: 06/28/2016 10:45 AM Medical Record Patient Account Number: [id number] Number: Treating RN: Carolyne Fiscal, Debi Feb 27, 1919 (82 y.o. 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 Other Clinician: Date of Birth/Sex: Female) Treating Jordyn Hofacker Primary Care Physician/Extender: Derrill Memo, [place] Physician: Referring Physician: Vinson Moselle in Treatment: 11 Subjective Chief Complaint Information obtained from Patient 04/06/16; the patient is here for review of wounds on the left lateral ankle and left 5th toe that been present for a year History of Present Illness (HPI) 04/06/16; this is a 82 year old woman who arrives accompanied by 2 daughters for a wound on her left ankle and her left fifth toe. These have apparently been present for a year. I'm not quite certain how she came to this clinic however she was being followed by Sharlotte Alamo her podiatrist for these wounds. She was also referred to [facility] and they apparently did a test presumably arterial studies although we don't have any of these results and we couldn't get through to the office today. The family but has been applying a combination of Bactroban and a light bandage and perhaps more recently Silvadene cream. She did have an x-ray of  the foot roughly 6 months ago at the podiatry office the family was unaware that if there were any abnormalities. Apparently they have not seen any healing here. Our intake nurse noted a slight skin tear on the right anterior lower leg. Nobody seemed aware of this. ABIs calculated in this clinic was 0.3 on the right and 0.4 on the left I have reviewed things in [facility]. There is very little information on this patient. She apparently follows in current total clinic which we don't have information from. She has mentioned already been to a [facility]. She has a history of hypothyroidism, nephrolithiasis arthritis and has had a previous mastectomy. 04/13/16; patient's x-ray was normal. She is already been to see Dr. Delana Meyer vascular surgery. Her arterial exam was from November 2016 this showed a left ABI of 0.62 her right of 0.76. Her duplex ultrasound of the left leg showed biphasic waves in the common femoral and distal femoral artery however monophasic waves in the superficial femoral artery proximal and mid biphasic it distal. 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 The patient tells me that she has pain at night when she tries to lie down which is improved by getting up and sitting in the chair this sounds like claudication at rest. Her wounds are on the left medial malleolus and the dorsal fifth toe small punched out wounds that are right on bone. We use Santyl last week 04/20/16: nurse informed me pt has declined evaluation for significant PAD. she denies systemic s/s of infections. 04/27/16;; the patient has had noninvasive arterial studies done in November 2016. ABI and the left was 0.62. Monophasic waves at the superficial femoral artery. Occluded to the posterior tibial artery. So had greater than 50% stenosis of the right superficial femoral and greater than 50% stenosis of the left superficial femoral artery. She had bilateral tibial peroneal artery disease. Both of the wounds on the  left [name], [name] ([id number]) fifth toe and left lateral malleolus have been present for more than a year.  They have been to see vein and vascular. The patient has some pain but miraculously I think the wounds have largely been stable. No evidence of infection 05/04/16; she goes for a noninvasive study tomorrow and then sees Dr. Fletcher Anon on Monday. By the time she is here next week we should have a better picture of whether something can be done with regards to her arterial flow. We continue to have ischemic-looking wounds on the left fifth toe and left lateral malleolus. 05/10/16; the patient went for her arterial studies and saw Dr. Fletcher Anon. As predicted she is felt to have critical limb ischemia. The feeling is that she has occlusion of the SFA. The feeling would she would be a candidate for a stent to the SFA. The patient did not make a decision to proceed with the procedure and she is here with family members to discuss this me today. He shouldn't has a lot of pain and cannot sleep and rest well at night per her family. 05/24/16; the patient went and had a complex revascularization/angioplasty of the left superficial femoral artery followed by drug-coated balloon angioplasty and spots stenting. She tolerated the procedure well. She was recommended for dual antiplatelet drugs with Plavix and aspirin for at least a month. She went yesterday for I believe follow-up serial Dopplers and ABIs although I don't see these results. The patient is unfortunately complaining of a lot of pain in her bilateral lower legs below the knees from the ankle to the knees. She apparently was prescribed lidocaine and apparently put this on her legs instead of over the wound areas. This may have something to do with it however there is a lot of edema in her bilateral legs I was able to find her arterial studies from yesterday. The left ABI has improved up to 0.66 post left SFA stent. The bilateral great toe indices  remain abnormal with the left being in the 0.25 range. Duplex ultrasound showed her left SFA stent is patent monophasic waveforms persist in the left leg 06/07/16; continued punched-out areas over the dorsal left fifth toe and left lateral malleolus. No major improvement 06/28/16; the areas over her dorsal left fifth toe and left lateral malleolus are covered in surface slough we are using Iodoflex Objective Constitutional Sitting or standing Blood Pressure is within target range for patient.. Pulse regular and within target range for patient.Marland Kitchen Respirations regular, non-labored and within target range.. Temperature is normal and within the target range for the patient.. Patient's appearance is neat and clean. Appears in no acute distress. Well nourished and well developed.. Vitals Time Taken: 10:46 AM, Height: 64 in, Weight: 158 lbs, BMI: 27.1, Temperature: 98.2 F, Pulse: 83 bpm, Respiratory Rate: 17 breaths/min, Blood Pressure: 143/45 mmHg. Respiratory Respiratory effort is easy and symmetric bilaterally. Rate is normal at rest and on room air.. Cardiovascular Pulses are absent.[name], [name] ([id number]) Lymphatic Nonpalpable in the popliteal or inguinal area. Psychiatric No evidence of depression, anxiety, or agitation. Calm, cooperative, and communicative. Appropriate interactions and affect.. General Notes: Wound exam; both the area on the dorsal aspect of her left fifth toe on the left lateral malleolus of a gelatinous surface slough over them. The area over the malleolus looks somewhat smaller. I elected not to debridement this today I'll allow the Iodosorb to continue. She did not have a heeling surface over the malleolus the last time I debrided this. My feeling at the time as this may be a wound she is going to need to live with  Integumentary (Hair, Skin) Wound #1 status is Open. Original cause of wound was Gradually Appeared. The wound is located on the Left Malleolus.  The wound measures 1cm length x 1cm width x 0.2cm depth; 0.785cm^2 area and 0.157cm^3 volume. The wound is limited to skin breakdown. There is no tunneling or undermining noted. There is a large amount of serous drainage noted. The wound margin is thickened. There is no granulation within the wound bed. There is a large (67-100%) amount of necrotic tissue within the wound bed including Adherent Slough. The periwound skin appearance exhibited: Localized Edema, Maceration, Moist, Erythema. The surrounding wound skin color is noted with erythema which is circumferential. Periwound temperature was noted as No Abnormality. The periwound has tenderness on palpation. Wound #2 status is Open. Original cause of wound was Gradually Appeared. The wound is located on the Left,Lateral Toe Fifth. The wound measures 0.8cm length x 0.6cm width x 0.2cm depth; 0.377cm^2 area and 0.075cm^3 volume. The wound is limited to skin breakdown. There is no tunneling or undermining noted. There is a large amount of serous drainage noted. The wound margin is thickened. There is no granulation within the wound bed. There is a large (67-100%) amount of necrotic tissue within the wound bed including Adherent Slough. The periwound skin appearance exhibited: Localized Edema, Maceration, Moist, Mottled. The periwound skin appearance did not exhibit: Callus, Crepitus, Excoriation, Fluctuance, Friable, Induration, Rash, Scarring, Dry/Scaly, Atrophie Blanche, Cyanosis, Ecchymosis, Hemosiderin Staining, Pallor, Rubor, Erythema. Periwound temperature was noted as No Abnormality. The periwound has tenderness on palpation. 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 - for clinic use Cleanse wound with mild soap and water May Shower, gently pat wound dry prior to applying new dressing. Wound #2 Left,Lateral Toe Fifth: Clean wound with Normal Saline. - for clinic use Cleanse wound with mild soap and water May Shower, gently pat wound dry prior to applying new dressing. Anesthetic: Wound #1 Left Malleolus: Topical Lidocaine 4% cream applied to wound bed prior to debridement - for clinic use Wound #2 Left,Lateral Toe Fifth: Topical Lidocaine 4% cream applied to wound bed prior to debridement - for clinic use Primary Wound Dressing: Wound #1 Left Malleolus: Iodoflex Wound #2 Left,Lateral Toe Fifth: Iodoflex Secondary Dressing: Wound #1 Left Malleolus: Gauze and Kerlix/Conform - netting, tape Foam Wound #2 Left,Lateral Toe Fifth: Gauze and Kerlix/Conform Foam Dressing Change Frequency: Wound #1 Left Malleolus: Change dressing every other day. Wound #2 Left,Lateral Toe Fifth: Change dressing every other day. Follow-up Appointments: Wound #1 Left Malleolus: Return Appointment in 1 week. Wound #2 Left,Lateral Toe Fifth: Return Appointment in 1 week. Edema Control: Wound #1 Left Malleolus: Elevate legs to the level of the heart and pump ankles as often as possible Other: - compression stockings Wound #2 Left,Lateral Toe Fifth: Elevate legs to the level of the heart and pump ankles as often as possible Other: - compression stockings Additional Orders / Instructions: Wound #1 Left Malleolus: [name], [name] ([id number]) Increase protein intake. Wound #2 Left,Lateral Toe Fifth: Increase protein intake. Medications-please add to medication list.: Wound #1 Left Malleolus: Other: - Vitamin C, Zinc, Multivitamins  Wound #2 Left,Lateral Toe Fifth: Other: - Vitamin C,  Zinc, Multivitamins Tramadol I'm going to continue with the Iodoflex dressings for another week. I may consider a gentle debridement at that point. Patient still has some discomfort although she states that this is better than before her revascularization Electronic Signature(s) Signed: 06/28/2016 3:24:48 PM By: [name] MD Entered By: [name] on 06/28/2016 11:20:37 [name] ([id number]) -------------------------------------------------------------------------------- [place] Details Patient Name: [name] Date of Service: 06/28/2016 Medical Record Patient Account Number: [id number] Number: Treating RN: Ahmed Prima 04-14-1919 (82 y.o. 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 chronic ulcer of other part of left foot with necrosis of muscle L97.211 Non-pressure chronic ulcer of right calf limited to breakdown of skin Facility Procedures CPT4 Code: [id number] Description: [id number] - WOUND CARE VISIT-LEV 2 EST PT Modifier: Quantity: 1 Physician Procedures CPT4: Description Modifier Quantity Code [id number] - WC PHYS LEVEL 2 - EST PT 1 ICD-10 Description Diagnosis I70.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 Electronic Signature(s) Signed: 06/28/2016 3:24:48 PM By: [name] MD Entered By: [name] on 06/28/2016 11:21:14

## 2016-06-28 NOTE — Progress Notes (Signed)
[name], [name] ([id number]) Visit Report for 06/28/2016 Arrival Information Details Patient Name: [name], HEENEY Date of Service: 06/28/2016 10:45 AM Medical Record Patient Account Number: [id number] Number: Treating RN: Baruch Gouty, RN, BSN, Rita [birth year] (82 y.o. Other Clinician: Date of Birth/Sex: Female) Treating ROBSON, [place] Primary Care Physician: Lorelee Market Physician/Extender: G Referring Physician: Vinson Moselle in Treatment: 11 Visit Information History Since Last Visit All ordered tests and consults were completed: No Patient Arrived: Ambulatory Added or deleted any medications: No Arrival Time: 10:45 Any new allergies or adverse reactions: No Accompanied By: dtr Had a fall or experienced change in No Transfer Assistance: None activities of daily living that may affect Patient Identification Verified: Yes risk of falls: Secondary Verification Process Yes Signs or symptoms of abuse/neglect since last No Completed: visito Patient Requires Transmission- No Hospitalized since last visit: No Based Precautions: Has Dressing in Place as Prescribed: Yes Patient Has Alerts: Yes Has Footwear/Offloading in Place as Yes Patient Alerts: 7/31 ABI: R-0.55 Prescribed: L-0.40 Left: Wedge  Shoe [facility] Pain Present Now: No Electronic Signature(s) Signed: 06/28/2016 4:32:24 PM By: Regan Lemming BSN, RN Entered By: Regan Lemming on 06/28/2016 10:46:41 [name] ([id number]) -------------------------------------------------------------------------------- Clinic Level of Care Assessment Details Patient Name: [name] Date of Service: 06/28/2016 10:45 AM Medical Record Patient Account Number: [id number] Number: Treating RN: Baruch Gouty, RN, BSN, Rita 1919-03-04 (82 y.o. Other Clinician: Date of Birth/Sex: Female) Treating ROBSON, [place] Primary Care Physician: Lorelee Market Physician/Extender: G Referring  Physician: Vinson Moselle in Treatment: 11 Clinic Level of Care Assessment Items TOOL 4 Quantity Score []  - Use when only an EandM is performed on FOLLOW-UP visit 0 ASSESSMENTS - Nursing Assessment / Reassessment X - Reassessment of Co-morbidities (includes updates in patient status) 1 10 X - Reassessment of Adherence to Treatment Plan 1 5 ASSESSMENTS - Wound and Skin Assessment / Reassessment []  - Simple Wound Assessment / Reassessment - one wound 0 X - Complex Wound Assessment / Reassessment - multiple wounds 2 5 []  - Dermatologic / Skin Assessment (not related to wound area) 0 ASSESSMENTS - Focused Assessment []  - Circumferential Edema Measurements - multi extremities 0 []  - Nutritional Assessment / Counseling / Intervention 0 X - Lower Extremity Assessment (monofilament, tuning fork, pulses) 1 5 []  - Peripheral Arterial Disease Assessment (using hand held doppler) 0 ASSESSMENTS - Ostomy and/or Continence Assessment and Care []  - Incontinence Assessment and Management 0 []  - Ostomy Care Assessment and Management (repouching, etc.) 0 PROCESS - Coordination of Care X - Simple Patient / Family Education for ongoing care 1 15 []  - Complex (extensive) Patient / Family Education for ongoing care 0 []  - Staff obtains Programmer, systems, Records, Test Results / Process Orders 0 []  - Staff telephones HHA, Nursing Homes / Clarify orders / etc 0 [name], [name] ([id number]) []  - Routine Transfer to another Facility (non-emergent condition) 0 []  - Routine Hospital Admission (non-emergent condition) 0 []  - New Admissions / Biomedical engineer / Ordering NPWT, Apligraf, etc. 0 []  - Emergency Hospital Admission (emergent condition) 0 []  - Simple Discharge Coordination 0 []  - Complex (extensive) Discharge Coordination 0 PROCESS - Special Needs []  - Pediatric / Minor Patient Management 0 []  - Isolation Patient Management 0 []  - Hearing / Language / Visual special needs 0 []  - Assessment of  Community assistance (transportation, D/C planning, etc.) 0 []  - Additional assistance / Altered mentation 0 []  - Support Surface(s) Assessment (bed, cushion, seat, etc.) 0 INTERVENTIONS - Wound Cleansing / Measurement X - Simple Wound  Cleansing - one wound 1 5 []  - Complex Wound Cleansing - multiple wounds 0 X - Wound Imaging (photographs - any number of wounds) 1 5 []  - Wound Tracing (instead of photographs) 0 X - Simple Wound Measurement - one wound 1 5 []  - Complex Wound Measurement - multiple wounds 0 INTERVENTIONS - Wound Dressings X - Small Wound Dressing one or multiple wounds 1 10 []  - Medium Wound Dressing one or multiple wounds 0 []  - Large Wound Dressing one or multiple wounds 0 []  - Application of Medications - topical 0 []  - Application of Medications - injection 0 Betzler, Annaelle ([id number]) INTERVENTIONS - Miscellaneous []  - External ear exam 0 []  - Specimen Collection (cultures, biopsies, blood, body fluids, etc.) 0 []  - Specimen(s) / Culture(s) sent or taken to Lab for analysis 0 []  - Patient Transfer (multiple staff / Harrel Lemon Lift / Similar devices) 0 []  - Simple Staple / Suture removal (25 or less) 0 []  - Complex Staple / Suture removal (26 or more) 0 []  - Hypo / Hyperglycemic Management (close monitor of Blood Glucose) 0 []  - Ankle / Brachial Index (ABI) - do not check if billed separately 0 X - Vital Signs 1 5 Has the patient been seen at the hospital within the last three years: Yes Total Score: 75 Level Of Care: New/Established - Level 2 Electronic Signature(s) Signed: 06/28/2016 4:32:24 PM By: Regan Lemming BSN, RN Entered By: Regan Lemming on 06/28/2016 11:15:27 [name] ([id number]) -------------------------------------------------------------------------------- Encounter Discharge Information Details Patient Name: [name] Date of Service: 06/28/2016 10:45 AM Medical Record Patient Account Number: [id number] Number: Treating RN:  Baruch Gouty, RN, BSN, Rita Dec 16, 1918 (82 y.o. Other Clinician: Date of Birth/Sex: Female) Treating ROBSON, [place] Primary Care Physician: Lorelee Market Physician/Extender: G Referring Physician: Vinson Moselle in Treatment: 11 Encounter Discharge Information Items Discharge Pain Level: 0 Discharge Condition: Stable Ambulatory Status: Ambulatory Discharge Destination: Home Transportation: Private Auto Accompanied By: dtr Schedule Follow-up Appointment: No Medication Reconciliation completed and provided to Patient/Care No Natassja Ollis: Provided on Clinical Summary of Care: 06/28/2016 Form Type Recipient Paper Patient [facility] Electronic Signature(s) Signed: 06/28/2016 4:32:24 PM By: Regan Lemming BSN, RN Previous Signature: 06/28/2016 11:15:14 AM Version By: Ruthine Dose Entered By: Regan Lemming on 06/28/2016 11:17:46 [name] ([id number]) -------------------------------------------------------------------------------- Lower Extremity Assessment Details Patient Name: [name] Date of Service: 06/28/2016 10:45 AM Medical Record Patient Account Number: [id number] Number: Treating RN: Baruch Gouty, RN, BSN, Rita [birth year] (82 y.o. Other Clinician: Date of Birth/Sex: Female) Treating ROBSON, [place] Primary Care Physician: Lorelee Market Physician/Extender: G Referring Physician: Lorelee Market Weeks in Treatment: 11 Edema Assessment Assessed: [Left: No] [Right: No] Edema: [Left: Ye] [Right: s] Vascular Assessment Claudication: Claudication Assessment [Left:None] Pulses: Posterior Tibial Dorsalis Pedis Palpable: [Left:No] Doppler: [Left:Monophasic] Extremity colors, hair growth, and conditions: Extremity Color: [Left:Mottled] Hair Growth on Extremity: [Left:No] Temperature of Extremity: [Left:Warm] Capillary Refill: [Left:< 3 seconds] Toe Nail Assessment Left: Right: Thick: Yes Discolored: Yes Deformed: No Improper Length and Hygiene:  No Electronic Signature(s) Signed: 06/28/2016 4:32:24 PM By: Regan Lemming BSN, RN Entered By: Regan Lemming on 06/28/2016 10:47:50 [name] ([id number]) -------------------------------------------------------------------------------- Multi Wound Chart Details Patient Name: [name] Date of Service: 06/28/2016 10:45 AM Medical Record Patient Account Number: [id number] Number: Treating RN: Baruch Gouty, RN, BSN, Rita 20-Feb-1919 (82 y.o. Other Clinician: Date of Birth/Sex: Female) Treating ROBSON, [place] Primary Care Physician: Lorelee Market Physician/Extender: G Referring Physician: Lorelee Market Weeks in Treatment: 11 Vital Signs Height(in): 64 Pulse(bpm): 83 Weight(lbs): 158 Blood Pressure  143/45 (mmHg): Body Mass Index(BMI): 27 Temperature(F): 98.2 Respiratory Rate 17 (breaths/min): Photos: [1:No Photos] [2:No Photos] [N/A:N/A] Wound Location: [1:Left Malleolus] [2:Left Toe Fifth - Lateral] [N/A:N/A] Wounding Event: [1:Gradually Appeared] [2:Gradually Appeared] [N/A:N/A] Primary Etiology: [1:Arterial Insufficiency Ulcer Arterial Insufficiency Ulcer N/A] Comorbid History: [1:Cataracts, Hypertension, Cataracts, Hypertension, N/A Osteoarthritis] [2:Osteoarthritis] Date Acquired: [1:04/07/2015] [2:04/07/2015] [N/A:N/A] Weeks of Treatment: [1:11] [2:11] [N/A:N/A] Wound Status: [1:Open] [2:Open] [N/A:N/A] Measurements L x W x D 1x1x0.2 [2:0.8x0.6x0.2] [N/A:N/A] (cm) Area (cm) : [1:0.785] [2:0.377] [N/A:N/A] Volume (cm) : [1:0.157] [2:0.075] [N/A:N/A] % Reduction in Area: [1:-103.90%] [2:-702.10%] [N/A:N/A] % Reduction in Volume: -103.90% [2:-733.30%] [N/A:N/A] Classification: [1:Partial Thickness] [2:Partial Thickness] [N/A:N/A] Exudate Amount: [1:Large] [2:Large] [N/A:N/A] Exudate Type: [1:Serous] [2:Serous] [N/A:N/A] Exudate Color: [1:amber] [2:amber] [N/A:N/A] Wound Margin: [1:Thickened] [2:Thickened] [N/A:N/A] Granulation Amount: [1:None  Present (0%)] [2:None Present (0%)] [N/A:N/A] Necrotic Amount: [1:Large (67-100%)] [2:Large (67-100%)] [N/A:N/A] Exposed Structures: [1:Fascia: No Fat: No Tendon: No Muscle: No Joint: No Bone: No] [2:Fascia: No Fat: No Tendon: No Muscle: No Joint: No Bone: No] [N/A:N/A] Limited to Skin Limited to Skin Breakdown Breakdown Epithelialization: None None N/A Periwound Skin Texture: Edema: Yes Edema: Yes N/A Excoriation: No Induration: No Callus: No Crepitus: No Fluctuance: No Friable: No Rash: No Scarring: No Periwound Skin Maceration: Yes Maceration: Yes N/A Moisture: Moist: Yes Moist: Yes Dry/Scaly: No Periwound Skin Color: Erythema: Yes Mottled: Yes N/A Atrophie Blanche: No Cyanosis: No Ecchymosis: No Erythema: No Hemosiderin Staining: No Pallor: No Rubor: No Erythema Location: Circumferential N/A N/A Temperature: No Abnormality No Abnormality N/A Tenderness on Yes Yes N/A Palpation: Wound Preparation: Ulcer Cleansing: Ulcer Cleansing: N/A Rinsed/Irrigated with Rinsed/Irrigated with Saline Saline Topical Anesthetic Topical Anesthetic Applied: Other: lidocaine Applied: Other: lidocaine 4% 4% Treatment Notes Electronic Signature(s) Signed: 06/28/2016 4:32:24 PM By: Regan Lemming BSN, RN Entered By: Regan Lemming on 06/28/2016 11:07:49 [name] ([id number]) -------------------------------------------------------------------------------- Multi-Disciplinary Care Plan Details Patient Name: [name] Date of Service: 06/28/2016 10:45 AM Medical Record Patient Account Number: [id number] Number: Treating RN: Baruch Gouty, RN, BSN, Rita [birth year] (82 y.o. Other Clinician: Date of Birth/Sex: Female) Treating ROBSON, [place] Primary Care Physician: Lorelee Market Physician/Extender: G Referring Physician: Vinson Moselle in Treatment: 11 Active Inactive Abuse / Safety / Falls / Self Care Management Nursing Diagnoses: Potential for  falls Goals: Patient will remain injury free Date Initiated: 04/06/2016 Goal Status: Active Interventions: Assess fall risk on admission and as needed Notes: Nutrition Nursing Diagnoses: Imbalanced nutrition Goals: Patient/caregiver agrees to and verbalizes understanding of need to use nutritional supplements and/or vitamins as prescribed Date Initiated: 04/06/2016 Goal Status: Active Interventions: Assess patient nutrition upon admission and as needed per policy Notes: Orientation to the Wound Care Program Nursing Diagnoses: Knowledge deficit related to the wound healing center program AULINE, KUYPER ([id number]) Goals: Patient/caregiver will verbalize understanding of the [place] Date Initiated: 04/06/2016 Goal Status: Active Interventions: Provide education on orientation to the wound center Notes: Pain, Acute or Chronic Nursing Diagnoses: Pain, acute or chronic: actual or potential Potential alteration in comfort, pain Goals: Patient will verbalize adequate pain control and receive pain control interventions during procedures as needed Date Initiated: 04/06/2016 Goal Status: Active Interventions: Assess comfort goal upon admission Complete pain assessment as per visit requirements Notes: Wound/Skin Impairment Nursing Diagnoses: Impaired tissue integrity Goals: Ulcer/skin breakdown will have a volume reduction of 30% by week 4 Date Initiated: 04/06/2016 Goal Status: Active Ulcer/skin breakdown will have a volume reduction of 50% by week 8 Date Initiated: 04/06/2016 Goal Status: Active Ulcer/skin breakdown will have a volume  reduction of 80% by week 12 Date Initiated: 04/06/2016 Goal Status: Active Interventions: Assess patient/caregiver ability to obtain necessary supplies KEYRIN, WATWOOD ([id number]) Assess ulceration(s) every visit Notes: Electronic Signature(s) Signed: 06/28/2016 4:32:24 PM By: Regan Lemming BSN, RN Entered By: Regan Lemming on 06/28/2016 10:53:38 [name] ([id number]) -------------------------------------------------------------------------------- Pain Assessment Details Patient Name: [name] Date of Service: 06/28/2016 10:45 AM Medical Record Patient Account Number: [id number] Number: Treating RN: Baruch Gouty, RN, BSN, Rita 19-Mar-1919 (82 y.o. Other Clinician: Date of Birth/Sex: Female) Treating ROBSON, [place] Primary Care Physician: Lorelee Market Physician/Extender: G Referring Physician: Lorelee Market Weeks in Treatment: 11 Active Problems Location of Pain Severity and Description of Pain Patient Has Paino No Site Locations With Dressing Change: No Pain Management and Medication Current Pain Management: Electronic Signature(s) Signed: 06/28/2016 4:32:24 PM By: Regan Lemming BSN, RN Entered By: Regan Lemming on 06/28/2016 10:46:52 [name] ([id number]) -------------------------------------------------------------------------------- Patient/Caregiver Education Details Patient Name: [name] Date of Service: 06/28/2016 10:45 AM Medical Record Patient Account Number: [id number] Number: Treating RN: Baruch Gouty, RN, BSN, Rita 1919/04/11 (82 y.o. Other Clinician: Date of Birth/Gender: Female) Treating ROBSON, MICHAEL Primary Care Physician: Lorelee Market Physician/Extender: G Referring Physician: Vinson Moselle in Treatment: 11 Education Assessment Education Provided To: Patient Education Topics Provided Welcome To [facility]: Methods: Explain/Verbal Responses: State content correctly Electronic Signature(s) Signed: 06/28/2016 4:32:24 PM By: Regan Lemming BSN, RN Entered By: Regan Lemming on 06/28/2016 11:17:57 [name] ([id number]) -------------------------------------------------------------------------------- Wound Assessment Details Patient Name: [name] Date of Service: 06/28/2016 10:45 AM Medical  Record Patient Account Number: [id number] Number: Treating RN: Baruch Gouty, RN, BSN, Rita 1919-04-14 (82 y.o. Other Clinician: Date of Birth/Sex: Female) Treating ROBSON, [place] Primary Care Physician: Lorelee Market Physician/Extender: G Referring Physician: Lorelee Market Weeks in Treatment: 11 Wound Status Wound Number: 1 Primary Arterial Insufficiency Ulcer Etiology: Wound Location: Left Malleolus Wound Status: Open Wounding Event: Gradually Appeared Comorbid Cataracts, Hypertension, Date Acquired: 04/07/2015 History: Osteoarthritis Weeks Of Treatment: 11 Clustered Wound: No Photos Photo Uploaded By: Regan Lemming on 06/28/2016 16:30:44 Wound Measurements Length: (cm) 1 Width: (cm) 1 Depth: (cm) 0.2 Area: (cm) 0.785 Volume: (cm) 0.157 % Reduction in Area: -103.9% % Reduction in Volume: -103.9% Epithelialization: None Tunneling: No Undermining: No Wound Description Classification: Partial Thickness Foul Odor Aft Wound Margin: Thickened Exudate Amount: Large Exudate Type: Serous Exudate Color: amber er Cleansing: No Wound Bed Granulation Amount: None Present (0%) Exposed Structure Necrotic Amount: Large (67-100%) Fascia Exposed: No Necrotic Quality: Adherent Slough Fat Layer Exposed: No Rausch, Amarisa ([id number]) Tendon Exposed: No Muscle Exposed: No Joint Exposed: No Bone Exposed: No Limited to Skin Breakdown Periwound Skin Texture Texture Color No Abnormalities Noted: No No Abnormalities Noted: No Localized Edema: Yes Erythema: Yes Erythema Location: Circumferential Moisture No Abnormalities Noted: No Temperature / Pain Maceration: Yes Temperature: No Abnormality Moist: Yes Tenderness on Palpation: Yes Wound Preparation Ulcer Cleansing: Rinsed/Irrigated with Saline Topical Anesthetic Applied: Other: lidocaine 4%, Treatment Notes Wound #1 (Left Malleolus) 1. Cleansed with: Clean wound with Normal Saline 3. Peri-wound  Care: Barrier cream 4. Dressing Applied: Iodoflex 5. Secondary Dressing Applied Dry Gauze Foam Kerlix/Conform 7. Secured with Tape Notes netting Electronic Signature(s) Signed: 06/28/2016 4:32:24 PM By: Regan Lemming BSN, RN Entered By: Regan Lemming on 06/28/2016 10:51:37 [name] ([id number]) -------------------------------------------------------------------------------- Wound Assessment Details Patient Name: [name] Date of Service: 06/28/2016 10:45 AM Medical Record Patient Account Number: [id number] Number: Treating RN: Baruch Gouty, RN, BSN, Rita 03-03-1919 (82 y.o. Other Clinician: Date of Birth/Sex: Female) Treating  [name] Primary Care Physician: Lorelee Market Physician/Extender: G Referring Physician: Lorelee Market Weeks in Treatment: 11 Wound Status Wound Number: 2 Primary Arterial Insufficiency Ulcer Etiology: Wound Location: Left Toe Fifth - Lateral Wound Status: Open Wounding Event: Gradually Appeared Comorbid Cataracts, Hypertension, Date Acquired: 04/07/2015 History: Osteoarthritis Weeks Of Treatment: 11 Clustered Wound: No Photos Photo Uploaded By: Regan Lemming on 06/28/2016 16:30:45 Wound Measurements Length: (cm) 0.8 Width: (cm) 0.6 Depth: (cm) 0.2 Area: (cm) 0.377 Volume: (cm) 0.075 % Reduction in Area: -702.1% % Reduction in Volume: -733.3% Epithelialization: None Tunneling: No Undermining: No Wound Description Classification: Partial Thickness Foul Odor Aft Wound Margin: Thickened Exudate Amount: Large Exudate Type: Serous Exudate Color: amber er Cleansing: No Wound Bed Granulation Amount: None Present (0%) Exposed Structure Necrotic Amount: Large (67-100%) Fascia Exposed: No Necrotic Quality: Adherent Slough Fat Layer Exposed: No Janvier, Charlsey ([id number]) Tendon Exposed: No Muscle Exposed: No Joint Exposed: No Bone Exposed: No Limited to Skin Breakdown Periwound Skin Texture Texture  Color No Abnormalities Noted: No No Abnormalities Noted: No Callus: No Atrophie Blanche: No Crepitus: No Cyanosis: No Excoriation: No Ecchymosis: No Fluctuance: No Erythema: No Friable: No Hemosiderin Staining: No Induration: No Mottled: Yes Localized Edema: Yes Pallor: No Rash: No Rubor: No Scarring: No Temperature / Pain Moisture Temperature: No Abnormality No Abnormalities Noted: No Tenderness on Palpation: Yes Dry / Scaly: No Maceration: Yes Moist: Yes Wound Preparation Ulcer Cleansing: Rinsed/Irrigated with Saline Topical Anesthetic Applied: Other: lidocaine 4%, Treatment Notes Wound #2 (Left, Lateral Toe Fifth) 1. Cleansed with: Clean wound with Normal Saline 3. Peri-wound Care: Barrier cream 4. Dressing Applied: Iodoflex 5. Secondary Dressing Applied Dry Gauze Foam Kerlix/Conform 7. Secured with Tape Notes netting Electronic Signature(s) EQUILLA, PYLAND ([id number]) Signed: 06/28/2016 4:32:24 PM By: Regan Lemming BSN, RN Entered By: Regan Lemming on 06/28/2016 10:52:39 [name] ([id number]) -------------------------------------------------------------------------------- Vitals Details Patient Name: [name] Date of Service: 06/28/2016 10:45 AM Medical Record Patient Account Number: [id number] Number: Treating RN: Baruch Gouty, RN, BSN, Rita 07/19/1919 (82 y.o. Other Clinician: Date of Birth/Sex: Female) Treating ROBSON, [place] Primary Care Physician: Lorelee Market Physician/Extender: G Referring Physician: Lorelee Market Weeks in Treatment: 11 Vital Signs Time Taken: 10:46 Temperature (F): 98.2 Height (in): 64 Pulse (bpm): 83 Weight (lbs): 158 Respiratory Rate (breaths/min): 17 Body Mass Index (BMI): 27.1 Blood Pressure (mmHg): 143/45 Reference Range: 80 - 120 mg / dl Electronic Signature(s) Signed: 06/28/2016 4:32:24 PM By: Regan Lemming BSN, RN Entered By: Regan Lemming on 06/28/2016 10:47:16

## 2016-07-05 ENCOUNTER — Encounter: Payer: Medicare PPO | Admitting: Internal Medicine

## 2016-07-05 DIAGNOSIS — I70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: Secondary | ICD-10-CM | POA: Diagnosis not present

## 2016-07-06 NOTE — Progress Notes (Signed)
DELVIA, ASANO ([id number]) Visit Report for 07/05/2016 Chief Complaint Document Details Patient Name: [name], [name] Date of Service: 07/05/2016 1:30 PM Medical Record Patient Account Number: [id number] Number: Treating RN: Ahmed Prima 10-16-1918 (82 y.o. Other Clinician: Date of Birth/Sex: Female) Treating Preet Perrier Primary Care Physician/Extender: Derrill Memo, [place] Physician: Referring Physician: Vinson Moselle in Treatment: 12 Information Obtained from: Patient Chief Complaint 04/06/16; the patient is here for review of wounds on the left lateral ankle and left 5th toe that been present for a year Electronic Signature(s) Signed: 07/06/2016 8:02:21 AM By: [name] MD Entered By: [name] on 07/05/2016 14:52:02 [name] ([id number]) -------------------------------------------------------------------------------- Debridement Details Patient Name: [name] Date of Service: 07/05/2016 1:30 PM Medical Record Patient Account Number: [id number] Number: Treating RN: Carolyne Fiscal, Debi [birth year] (82 y.o. Other Clinician: Date of Birth/Sex: Female) Treating Lamon Rotundo Primary Care Physician/Extender: Derrill Memo, [place] Physician: Referring Physician: Vinson Moselle in Treatment: 12 Debridement Performed for Wound #1 Left Malleolus Assessment: Performed By: Physician Ricard Dillon, MD Debridement: Debridement Pre-procedure Yes - 13:55 Verification/Time Out Taken: Start Time: 13:58 Pain Control: Lidocaine 4% Topical Solution Level: Skin/Subcutaneous Tissue Total Area Debrided (L x 0.8 (cm) x 0.6 (cm) = 0.48 (cm) W): Tissue and other Viable, Non-Viable, Exudate, Fibrin/Slough, Subcutaneous material debrided: Instrument: Curette Bleeding: Minimum Hemostasis Achieved: Pressure End Time: 14:00 Procedural Pain: 0 Post Procedural Pain: 0 Response to Treatment: Procedure was tolerated  well Post Debridement Measurements of Total Wound Length: (cm) 0.8 Width: (cm) 0.6 Depth: (cm) 0.3 Volume: (cm) 0.113 Character of Wound/Ulcer Post Requires Further Debridement Debridement: Severity of Tissue Post Debridement: Fat layer exposed Post Procedure Diagnosis Same as Pre-procedure Electronic Signature(s) Signed: 07/05/2016 4:46:10 PM By: Myriam Jacobson, Estill Bamberg ([id number]) Signed: 07/06/2016 8:02:21 AM By: [name] MD Entered By: [name] on 07/05/2016 14:51:34 [name] ([id number]) -------------------------------------------------------------------------------- Debridement Details Patient Name: [name] Date of Service: 07/05/2016 1:30 PM Medical Record Patient Account Number: [id number] Number: Treating RN: Carolyne Fiscal, Debi [birth year] (82 y.o. Other Clinician: Date of Birth/Sex: Female) Treating Edelin Fryer Primary Care Physician/Extender: Derrill Memo, [place] Physician: Referring Physician: Vinson Moselle in Treatment: 12 Debridement Performed for Wound #2 Left,Lateral Toe Fifth Assessment: Performed By: Physician Ricard Dillon, MD Debridement: Debridement Pre-procedure Yes - 13:55 Verification/Time Out Taken: Start Time: 13:56 Pain Control: Lidocaine 4% Topical Solution Level: Skin/Subcutaneous Tissue Total Area Debrided (L x 0.5 (cm) x 0.4 (cm) = 0.2 (cm) W): Tissue and other Viable, Non-Viable, Exudate, Fibrin/Slough, Subcutaneous material debrided: Instrument: Curette Bleeding: Minimum Hemostasis Achieved: Pressure End Time: 13:58 Procedural Pain: 0 Post Procedural Pain: 0 Response to Treatment: Procedure was tolerated well Post Debridement Measurements of Total Wound Length: (cm) 0.5 Width: (cm) 0.4 Depth: (cm) 0.3 Volume: (cm) 0.047 Character of Wound/Ulcer Post Requires Further Debridement Debridement: Severity of Tissue Post Debridement: Fat layer exposed Post  Procedure Diagnosis Same as Pre-procedure Electronic Signature(s) Signed: 07/05/2016 4:46:10 PM By: Myriam Jacobson, Estill Bamberg ([id number]) Signed: 07/06/2016 8:02:21 AM By: [name] MD Entered By: [name] on 07/05/2016 14:51:49 [name] ([id number]) -------------------------------------------------------------------------------- HPI Details Patient Name: [name] Date of Service: 07/05/2016 1:30 PM Medical Record Patient Account Number: [id number] Number: Treating RN: Ahmed Prima [birth year] (82 y.o. Other Clinician: Date of Birth/Sex: Female) Treating Salomon Ganser Primary Care Physician/Extender: Derrill Memo, [place] Physician: Referring Physician: Lorelee Market Weeks in Treatment: 12 History of Present Illness HPI Description: 04/06/16; this is a 82 year old woman who arrives accompanied by 2 daughters for a wound on her  left ankle and her left fifth toe. These have apparently been present for a year. I'm not quite certain how she came to this clinic however she was being followed by Sharlotte Alamo her podiatrist for these wounds. She was also referred to [facility] and they apparently did a test presumably arterial studies although we don't have any of these results and we couldn't get through to the office today. The family but has been applying a combination of Bactroban and a light bandage and perhaps more recently Silvadene cream. She did have an x-ray of the foot roughly 6 months ago at the podiatry office the family was unaware that if there were any abnormalities. Apparently they have not seen any healing here. Our intake nurse noted a slight skin tear on the right anterior lower leg. Nobody seemed aware of this. ABIs calculated in this clinic was 0.3 on the right and 0.4 on the left I have reviewed things [facility]. There is very little information on this patient. She apparently follows in  current total clinic which we don't have information from. She has mentioned already been to a [facility]. She has a history of hypothyroidism, nephrolithiasis arthritis and has had a previous mastectomy. 04/13/16; patient's x-ray was normal. She is already been to see Dr. Delana Meyer vascular surgery. Her arterial exam was from November 2016 this showed a left ABI of 0.62 her right of 0.76. Her duplex ultrasound of the left leg showed biphasic waves in the common femoral and distal femoral artery however monophasic waves in the superficial femoral artery proximal and mid biphasic it distal. 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 The patient tells me that she has pain at night when she tries to lie down which is improved by getting up and sitting in the chair this sounds like claudication at rest. Her wounds are on the left medial malleolus and the dorsal fifth toe small punched out wounds that are right on bone. We use Santyl last week 04/20/16: nurse informed me pt has declined evaluation for significant PAD. she denies systemic s/s of infections. 04/27/16;; the patient has had noninvasive arterial studies done in November 2016. ABI and the left was 0.62. Monophasic waves at the superficial femoral artery. Occluded to the posterior tibial artery. So had greater than 50% stenosis of the right superficial femoral and greater than 50% stenosis of the left superficial femoral artery. She had bilateral tibial peroneal artery disease. Both of the wounds on the left fifth toe and left lateral malleolus have been present for more than a year. They have been to see vein and vascular. The patient has some pain but miraculously I think the wounds have largely been stable. No evidence of infection 05/04/16; she goes for a noninvasive study tomorrow and then sees Dr. Fletcher Anon on Monday. By the time she is here next week we should have a better picture of whether something can be done with regards to her arterial flow. We  continue to have ischemic-looking wounds on the left fifth toe and left lateral malleolus. 05/10/16; the patient went for her arterial studies and saw Dr. Fletcher Anon. As predicted she is felt to have critical Opheim, Reighn ([id number]) limb ischemia. The feeling is that she has occlusion of the SFA. The feeling would she would be a candidate for a stent to the SFA. The patient did not make a decision to proceed with the procedure and she is here with family members to discuss this me today. He shouldn't  has a lot of pain and cannot sleep and rest well at night per her family. 05/24/16; the patient went and had a complex revascularization/angioplasty of the left superficial femoral artery followed by drug-coated balloon angioplasty and spots stenting. She tolerated the procedure well. She was recommended for dual antiplatelet drugs with Plavix and aspirin for at least a month. She went yesterday for I believe follow-up serial Dopplers and ABIs although I don't see these results. The patient is unfortunately complaining of a lot of pain in her bilateral lower legs below the knees from the ankle to the knees. She apparently was prescribed lidocaine and apparently put this on her legs instead of over the wound areas. This may have something to do with it however there is a lot of edema in her bilateral legs I was able to find her arterial studies from yesterday. The left ABI has improved up to 0.66 post left SFA stent. The bilateral great toe indices remain abnormal with the left being in the 0.25 range. Duplex ultrasound showed her left SFA stent is patent monophasic waveforms persist in the left leg 06/07/16; continued punched-out areas over the dorsal left fifth toe and left lateral malleolus. No major improvement 06/28/16; the areas over her dorsal left fifth toe and left lateral malleolus are covered in surface slough we are using Iodoflex 07/05/16. We have been using Iodoflex for 2-3 weeks now. I have  not been debridement is because of pain Electronic Signature(s) Signed: 07/06/2016 8:02:21 AM By: [name] MD Entered By: [name] on 07/05/2016 14:53:37 [name] ([id number]) -------------------------------------------------------------------------------- Physical Exam Details Patient Name: [name] Date of Service: 07/05/2016 1:30 PM Medical Record Patient Account Number: [id number] Number: Treating RN: Ahmed Prima [birth year] (82 y.o. Other Clinician: Date of Birth/Sex: Female) Treating Colleene Swarthout Primary Care Physician/Extender: Derrill Memo, [place] Physician: Referring Physician: Lorelee Market Weeks in Treatment: 12 Constitutional Sitting or standing Blood Pressure is within target range for patient.. Pulse regular and within target range for patient.Marland Kitchen Respirations regular, non-labored and within target range.. Temperature is normal and within the target range for the patient.. Patient's appearance is neat and clean. Appears in no acute distress. Well nourished and well developed.. Cardiovascular Pedal pulses absent on the left. Notes Wound exam; both the area on the dorsal aspect of her left fifth toe and the left lateral malleolus appear improved. For the first time in 2-3 weeks I debrided these with a curet. The toe actually looks somewhat improved. The left lateral malleolus has the appearance of a viable surface although there is still insufferable relative depth to this wound. Neither site had any infection evident Electronic Signature(s) Signed: 07/06/2016 8:02:21 AM By: [name] MD Entered By: [name] on 07/05/2016 14:56:04 [name] ([id number]) -------------------------------------------------------------------------------- Physician Orders Details Patient Name: [name] Date of Service: 07/05/2016 1:30 PM Medical Record Patient Account Number: [id number] Number: Treating RN:  Ahmed Prima Nov 27, 1918 (82 y.o. Other Clinician: Date of Birth/Sex: Female) Treating Azariah Bonura Primary Care Physician/Extender: Derrill Memo, [place] Physician: Referring Physician: Vinson Moselle in Treatment: 12 Verbal / Phone Orders: Yes Clinician: Carolyne Fiscal, Debi Read Back and Verified: Yes Diagnosis Coding Wound Cleansing Wound #1 Left Malleolus o Clean wound with Normal Saline. - for clinic use o Cleanse wound with mild soap and water o May Shower, gently pat wound dry prior to applying new dressing. Wound #2 Left,Lateral Toe Fifth o Clean wound with Normal Saline. - for clinic use o Cleanse wound with mild soap and water   o May Shower, gently pat wound dry prior to applying new dressing. Anesthetic Wound #1 Left Malleolus o Topical Lidocaine 4% cream applied to wound bed prior to debridement - for clinic use Wound #2 Left,Lateral Toe Fifth o Topical Lidocaine 4% cream applied to wound bed prior to debridement - for clinic use Primary Wound Dressing Wound #1 Left Malleolus o Iodoflex Wound #2 Left,Lateral Toe Fifth o Iodoflex Secondary Dressing Wound #1 Left Malleolus o Dry Gauze o Boardered Foam Dressing Wound #2 Left,Lateral Toe Fifth o Dry Gauze o Boardered Foam Dressing Cadenhead, Joniya ([id number]) Dressing Change Frequency Wound #1 Left Malleolus o Change dressing every other day. Wound #2 Left,Lateral Toe Fifth o Change dressing every other day. Follow-up Appointments Wound #1 Left Malleolus o Return Appointment in 2 weeks. Wound #2 Left,Lateral Toe Fifth o Return Appointment in 2 weeks. Edema Control Wound #1 Left Malleolus o Elevate legs to the level of the heart and pump ankles as often as possible o Other: - compression stockings Wound #2 Left,Lateral Toe Fifth o Elevate legs to the level of the heart and pump ankles as often as possible o Other: - compression stockings Additional  Orders / Instructions Wound #1 Left Malleolus o Increase protein intake. Wound #2 Left,Lateral Toe Fifth o Increase protein intake. Medications-please add to medication list. Wound #1 Left Malleolus o Other: - Vitamin C, Zinc, Multivitamins Wound #2 Left,Lateral Toe Fifth o Other: - Vitamin C, Zinc, Multivitamins Tramadol Electronic Signature(s) Signed: 07/05/2016 4:46:10 PM By: Alric Quan Signed: 07/06/2016 8:02:21 AM By: [name] MD Entered By: Alric Quan on 07/05/2016 13:59:40 [name] ([id number]) -------------------------------------------------------------------------------- Problem List Details Patient Name: [name] Date of Service: 07/05/2016 1:30 PM Medical Record Patient Account Number: [id number] Number: Treating RN: Carolyne Fiscal, Debi 1919/01/07 (82 y.o. 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 Other Clinician: Date of Birth/Sex: Female) Treating Antrone Walla Primary Care Physician/Extender: Derrill Memo, [place] Physician: Referring Physician: Vinson Moselle in Treatment: 12 Subjective Chief Complaint Information obtained from Patient 04/06/16; the patient is here for review of wounds on the left lateral ankle and left 5th toe that been present for a year History of Present Illness (HPI) 04/06/16; this is a 82 year old woman who arrives accompanied by 2 daughters for a wound on her left ankle and her left fifth toe. These have apparently been present for a year. I'm not quite certain how she came to this clinic however she was being followed by Sharlotte Alamo her podiatrist for these wounds. She was also referred to [facility] and they apparently did a test presumably arterial studies although we don't have any of these results and we couldn't get through to  the office today. The family but has been applying a combination of Bactroban and a light bandage and perhaps more recently Silvadene cream. She did have an x-ray of the foot roughly 6 months ago at the podiatry office the family was unaware that if there were any abnormalities. Apparently they have not seen any healing here. Our intake nurse noted a slight skin tear on the right anterior lower leg. Nobody seemed aware of this. ABIs calculated in this clinic was 0.3 on the right and 0.4 on the left I have reviewed things [facility]. There is very little information on this patient. She apparently follows in current total clinic which we don't have information from. She has mentioned already been to a [facility]. She has a history of hypothyroidism, nephrolithiasis arthritis and has had a previous mastectomy. 04/13/16; patient's x-ray was normal. She is already been to see Dr. Delana Meyer vascular surgery. Her arterial exam was  from November 2016 this showed a left ABI of 0.62 her right of 0.76. Her duplex ultrasound of the left leg showed biphasic waves in the common femoral and distal femoral artery however monophasic waves in the superficial femoral artery proximal and mid biphasic it distal. 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 The patient tells me that she has pain at night when she tries to lie down which is improved by getting up and sitting in the chair this sounds like claudication at rest. Her wounds are on the left medial malleolus and the dorsal fifth toe small punched out wounds that are right on bone. We use Santyl last week 04/20/16: nurse informed me pt has declined evaluation for significant PAD. she denies systemic s/s of infections. 04/27/16;; the patient has had noninvasive arterial studies done in November 2016. ABI and the left was 0.62. Monophasic waves at the superficial femoral artery. Occluded to the posterior tibial artery. So had greater than 50% stenosis of the right superficial femoral and greater than 50% stenosis of the left superficial femoral artery. She had bilateral tibial peroneal artery disease. Both of the wounds on the left Walkins, Ranyia ([id number]) fifth toe and left lateral malleolus have been present for more than a year. They have been to see vein and vascular. The patient has some pain but miraculously I think the wounds have largely been stable. No evidence of infection 05/04/16; she goes for a noninvasive study tomorrow and then sees Dr. Fletcher Anon on Monday. By the time she is here next week we should have a better picture of whether something can be done with regards to her arterial flow. We continue to have ischemic-looking wounds on the left fifth toe and left lateral malleolus. 05/10/16; the patient went for her arterial studies and saw Dr. Fletcher Anon. As predicted she is felt to have critical limb ischemia. The feeling is that she has occlusion of the SFA. The feeling would  she would be a candidate for a stent to the SFA. The patient did not make a decision to proceed with the procedure and she is here with family members to discuss this me today. He shouldn't has a lot of pain and cannot sleep and rest well at night per her family. 05/24/16; the patient went and had a complex revascularization/angioplasty of the left superficial femoral artery followed by drug-coated balloon angioplasty and spots stenting. She tolerated the procedure well. She was recommended for dual antiplatelet drugs with Plavix and aspirin for at least a month. She went yesterday for I believe follow-up serial Dopplers and  ABIs although I don't see these results. The patient is unfortunately complaining of a lot of pain in her bilateral lower legs below the knees from the ankle to the knees. She apparently was prescribed lidocaine and apparently put this on her legs instead of over the wound areas. This may have something to do with it however there is a lot of edema in her bilateral legs I was able to find her arterial studies from yesterday. The left ABI has improved up to 0.66 post left SFA stent. The bilateral great toe indices remain abnormal with the left being in the 0.25 range. Duplex ultrasound showed her left SFA stent is patent monophasic waveforms persist in the left leg 06/07/16; continued punched-out areas over the dorsal left fifth toe and left lateral malleolus. No major improvement 06/28/16; the areas over her dorsal left fifth toe and left lateral malleolus are covered in surface slough we are using Iodoflex 07/05/16. We have been using Iodoflex for 2-3 weeks now. I have not been debridement is because of pain Objective Constitutional Sitting or standing Blood Pressure is within target range for patient.. Pulse regular and within target range for patient.Marland Kitchen Respirations regular, non-labored and within target range.. Temperature is normal and within the target range for the  patient.. Patient's appearance is neat and clean. Appears in no acute distress. Well nourished and well developed.. Vitals Time Taken: 1:40 PM, Height: 64 in, Weight: 158 lbs, BMI: 27.1, Temperature: 97.6 F, Pulse: 83 bpm, Respiratory Rate: 18 breaths/min, Blood Pressure: 143/54 mmHg. General Notes: Made Dr. Dellia Nims aware of diastolic BP. Cardiovascular Pedal pulses absent on the left. KITZIA, HONEGGER ([id number]) General Notes: Wound exam; both the area on the dorsal aspect of her left fifth toe and the left lateral malleolus appear improved. For the first time in 2-3 weeks I debrided these with a curet. The toe actually looks somewhat improved. The left lateral malleolus has the appearance of a viable surface although there is still insufferable relative depth to this wound. Neither site had any infection evident Integumentary (Hair, Skin) Wound #1 status is Open. Original cause of wound was Gradually Appeared. The wound is located on the Left Malleolus. The wound measures 0.8cm length x 0.6cm width x 0.2cm depth; 0.377cm^2 area and 0.075cm^3 volume. The wound is limited to skin breakdown. There is no tunneling or undermining noted. There is a large amount of serous drainage noted. The wound margin is thickened. There is no granulation within the wound bed. There is a large (67-100%) amount of necrotic tissue within the wound bed including Adherent Slough. The periwound skin appearance exhibited: Localized Edema, Maceration, Moist, Erythema. The surrounding wound skin color is noted with erythema which is circumferential. Periwound temperature was noted as No Abnormality. The periwound has tenderness on palpation. Wound #2 status is Open. Original cause of wound was Gradually Appeared. The wound is located on the Left,Lateral Toe Fifth. The wound measures 0.5cm length x 0.4cm width x 0.2cm depth; 0.157cm^2 area and 0.031cm^3 volume. The wound is limited to skin breakdown. There is a large  amount of serous drainage noted. The wound margin is thickened. There is no granulation within the wound bed. There is a large (67-100%) amount of necrotic tissue within the wound bed including Adherent Slough. The periwound skin appearance exhibited: Localized Edema, Maceration, Moist, Mottled. The periwound skin appearance did not exhibit: Callus, Crepitus, Excoriation, Fluctuance, Friable, Induration, Rash, Scarring, Dry/Scaly, Atrophie Blanche, Cyanosis, Ecchymosis, Hemosiderin Staining, Pallor, Rubor, Erythema. Periwound temperature was noted as No  Abnormality. The periwound has tenderness on palpation. 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 There was a Skin/Subcutaneous Tissue Debridement [id number]) debridement with total area of 0.48 sq cm performed by Ricard Dillon, MD. with the following instrument(s): Curette to remove Viable and Non-Viable tissue/material including Exudate, Fibrin/Slough, and Subcutaneous after achieving pain control using Lidocaine 4% Steele, Juan ([id number]) Topical Solution. A time out was conducted at 13:55, prior to the start of the procedure. A Minimum amount of bleeding was controlled with Pressure. The procedure was tolerated well with a pain level of 0 throughout and a pain level of 0 following the procedure. Post Debridement Measurements: 0.8cm length x 0.6cm width x 0.3cm depth; 0.113cm^3 volume. Character of Wound/Ulcer Post Debridement requires further debridement. Severity of Tissue Post Debridement is: Fat  layer exposed. 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 There was a Skin/Subcutaneous Tissue Debridement [id number] debridement with total area of 0.2 sq cm performed by Ricard Dillon, MD. with the following instrument(s): Curette to remove Viable and Non-Viable tissue/material including Exudate, Fibrin/Slough, and Subcutaneous after achieving pain control using Lidocaine 4% Topical Solution. A time out was conducted at 13:55, prior to the start of the procedure. A Minimum amount of bleeding was controlled with Pressure. The procedure was tolerated well with a pain level of 0 throughout and a pain level of 0 following the procedure. Post Debridement Measurements: 0.5cm length x 0.4cm width x 0.3cm depth; 0.047cm^3 volume. Character of Wound/Ulcer Post Debridement requires further debridement. Severity of Tissue Post Debridement is: Fat layer exposed. Post procedure Diagnosis Wound #2: Same as Pre-Procedure Plan Wound Cleansing: Wound #1 Left Malleolus: Clean wound with Normal Saline. - for clinic use Cleanse wound with mild soap and water May Shower, gently pat wound dry prior to applying new dressing. Wound #2 Left,Lateral Toe Fifth: Clean wound with Normal Saline. - for clinic use Cleanse wound with mild soap and water May Shower, gently pat wound dry prior to applying new dressing. Anesthetic: Wound #1 Left Malleolus: Topical Lidocaine 4% cream applied to wound bed prior to debridement - for clinic use Wound #2 Left,Lateral Toe Fifth: Topical Lidocaine 4% cream applied to wound bed prior to debridement - for clinic use Primary Wound Dressing: Wound #1 Left Malleolus: Iodoflex Wound #2 Left,Lateral Toe Fifth: Iodoflex Secondary Dressing: Wound #1 Left Malleolus: Cowher, Kriss ([id number]) Dry Gauze Boardered Foam Dressing Wound #2 Left,Lateral Toe Fifth: Dry  Gauze Boardered Foam Dressing Dressing Change Frequency: Wound #1 Left Malleolus: Change dressing every other day. Wound #2 Left,Lateral Toe Fifth: Change dressing every other day. Follow-up Appointments: Wound #1 Left Malleolus: Return Appointment in 2 weeks. Wound #2 Left,Lateral Toe Fifth: Return Appointment in 2 weeks. Edema Control: Wound #1 Left Malleolus: Elevate legs to the level of the heart and pump ankles as often as possible Other: - compression stockings Wound #2 Left,Lateral Toe Fifth: Elevate legs to the level of the heart and pump ankles as often as possible Other: - compression stockings Additional Orders /  Instructions: Wound #1 Left Malleolus: Increase protein intake. Wound #2 Left,Lateral Toe Fifth: Increase protein intake. Medications-please add to medication list.: Wound #1 Left Malleolus: Other: - Vitamin C, Zinc, Multivitamins Wound #2 Left,Lateral Toe Fifth: Other: - Vitamin C, Zinc, Multivitamins Tramadol continue iodoflex unde border foam. follow in 2 weeks. Looks somewhat improved Electronic Signature(s) Signed: 07/06/2016 8:02:21 AM By: [name] MD Entered By: [name] on 07/05/2016 14:57:17 [name] ([id number]) -------------------------------------------------------------------------------- [place] Details Patient Name: [name] Date of Service: 07/05/2016 Medical Record Patient Account Number: [id number] Number: Treating RN: Ahmed Prima 05/23/1919 (82 y.o. 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

## 2016-07-06 NOTE — Progress Notes (Signed)
[name], [name] ([id number]) Visit Report for 07/05/2016 Arrival Information Details Patient Name: [name], [name] Date of Service: 07/05/2016 1:30 PM Medical Record Patient Account Number: [id number] Number: Treating RN: Ahmed Prima 02-20-1919 (82 y.o. Other Clinician: Date of Birth/Sex: Female) Treating ROBSON, [place] Primary Care Physician: Lorelee Market Physician/Extender: G Referring Physician: Vinson Moselle in Treatment: 12 Visit Information History Since Last Visit All ordered tests and consults were completed: No Patient Arrived: Ambulatory Added or deleted any medications: No Arrival Time: 13:39 Any new allergies or adverse reactions: No Accompanied By: daughter Had a fall or experienced change in No Transfer Assistance: Easy[employer] Patient activities of daily living that may affect Lift risk of falls: Patient Identification Verified: Yes Signs or symptoms of abuse/neglect since last No Secondary Verification Process Yes visito Completed: Pain Present Now: No Patient Requires Transmission- No Based Precautions: Patient Has Alerts: Yes Patient Alerts: 7/31 ABI: R-0.55 L-0.40 [facility] Electronic Signature(s) Signed: 07/05/2016 4:46:10 PM By: Alric Quan Entered By: Alric Quan on 07/05/2016 13:39:59 [name] ([id number]) -------------------------------------------------------------------------------- Encounter Discharge Information Details Patient Name: [name] Date of Service: 07/05/2016 1:30 PM Medical Record Patient Account Number: [id number] Number: Treating RN: Ahmed Prima July 18, 1919 (82 y.o. Other Clinician: Date of Birth/Sex: Female) Treating ROBSON, [place] Primary Care Physician: Lorelee Market Physician/Extender: G Referring Physician: Vinson Moselle in Treatment: 12 Encounter Discharge Information Items Schedule Follow-up Appointment: No Medication  Reconciliation completed No and provided to Patient/Care Brylei Pedley: Provided on Clinical Summary of Care: 07/05/2016 Form Type Recipient Paper Patient [facility] Electronic Signature(s) Signed: 07/05/2016 2:12:33 PM By: Ruthine Dose Entered By: Ruthine Dose on 07/05/2016 14:12:33 [name] ([id number]) -------------------------------------------------------------------------------- Lower Extremity Assessment Details Patient Name: [name] Date of Service: 07/05/2016 1:30 PM Medical Record Patient Account Number: [id number] Number: Treating RN: Ahmed Prima 08/31/1919 (82 y.o. Other Clinician: Date of Birth/Sex: Female) Treating ROBSON, [place] Primary Care Physician: Lorelee Market Physician/Extender: G Referring Physician: Lorelee Market Weeks in Treatment: 12 Vascular Assessment Pulses: Posterior Tibial Dorsalis Pedis Palpable: [Left:No] [Right:No] Doppler: [Left:Monophasic] [Right:Monophasic] Extremity colors, hair growth, and conditions: Temperature of Extremity: [Left:Warm] Capillary Refill: [Left:< 3 seconds] Toe Nail Assessment Left: Right: Thick: Yes Discolored: Yes Deformed: No Improper Length and Hygiene: No Electronic Signature(s) Signed: 07/05/2016 4:46:10 PM By: Alric Quan Entered By: Alric Quan on 07/05/2016 13:44:03 [name] ([id number]) -------------------------------------------------------------------------------- Multi Wound Chart Details Patient Name: [name] Date of Service: 07/05/2016 1:30 PM Medical Record Patient Account Number: [id number] Number: Treating RN: Ahmed Prima September 29, 1919 (82 y.o. Other Clinician: Date of Birth/Sex: Female) Treating ROBSON, [place] Primary Care Physician: Lorelee Market Physician/Extender: G Referring Physician: Lorelee Market Weeks in Treatment: 12 Vital Signs Height(in): 64 Pulse(bpm): 83 Weight(lbs): 158 Blood  Pressure 143/54 (mmHg): Body Mass Index(BMI): 27 Temperature(F): 97.6 Respiratory Rate 18 (breaths/min): Photos: [1:No Photos] [2:No Photos] [N/A:N/A] Wound Location: [1:Left Malleolus] [2:Left Toe Fifth - Lateral] [N/A:N/A] Wounding Event: [1:Gradually Appeared] [2:Gradually Appeared] [N/A:N/A] Primary Etiology: [1:Arterial Insufficiency Ulcer Arterial Insufficiency Ulcer N/A] Comorbid History: [1:Cataracts, Hypertension, Cataracts, Hypertension, N/A Osteoarthritis] [2:Osteoarthritis] Date Acquired: [1:04/07/2015] [2:04/07/2015] [N/A:N/A] Weeks of Treatment: [1:12] [2:12] [N/A:N/A] Wound Status: [1:Open] [2:Open] [N/A:N/A] Measurements L x W x D 0.8x0.6x0.2 [2:0.5x0.4x0.2] [N/A:N/A] (cm) Area (cm) : [1:0.377] [2:0.157] [N/A:N/A] Volume (cm) : [1:0.075] [2:0.031] [N/A:N/A] % Reduction in Area: [1:2.10%] [2:-234.00%] [N/A:N/A] % Reduction in Volume: 2.60% [2:-244.40%] [N/A:N/A] Classification: [1:Partial Thickness] [2:Partial Thickness] [N/A:N/A] Exudate Amount: [1:Large] [2:Large] [N/A:N/A] Exudate Type: [1:Serous] [2:Serous] [N/A:N/A] Exudate Color: [1:amber] [2:amber] [N/A:N/A] Wound Margin: [1:Thickened] [2:Thickened] [N/A:N/A] Granulation Amount: [1:None Present (0%)] [2:None  Present (0%)] [N/A:N/A] Necrotic Amount: [1:Large (67-100%)] [2:Large (67-100%)] [N/A:N/A] Exposed Structures: [1:Fascia: No Fat: No Tendon: No Muscle: No Joint: No Bone: No] [2:Fascia: No Fat: No Tendon: No Muscle: No Joint: No Bone: No] [N/A:N/A] Limited to Skin Limited to Skin Breakdown Breakdown Epithelialization: None None N/A Periwound Skin Texture: Edema: Yes Edema: Yes N/A Excoriation: No Induration: No Callus: No Crepitus: No Fluctuance: No Friable: No Rash: No Scarring: No Periwound Skin Maceration: Yes Maceration: Yes N/A Moisture: Moist: Yes Moist: Yes Dry/Scaly: No Periwound Skin Color: Erythema: Yes Mottled: Yes N/A Atrophie Blanche: No Cyanosis: No Ecchymosis:  No Erythema: No Hemosiderin Staining: No Pallor: No Rubor: No Erythema Location: Circumferential N/A N/A Temperature: No Abnormality No Abnormality N/A Tenderness on Yes Yes N/A Palpation: Wound Preparation: Ulcer Cleansing: Ulcer Cleansing: N/A Rinsed/Irrigated with Rinsed/Irrigated with Saline Saline Topical Anesthetic Topical Anesthetic Applied: Other: lidocaine Applied: Other: lidocaine 4% 4% Treatment Notes Electronic Signature(s) Signed: 07/05/2016 4:46:10 PM By: Alric Quan Entered By: Alric Quan on 07/05/2016 13:50:57 [name] ([id number]) -------------------------------------------------------------------------------- Multi-Disciplinary Care Plan Details Patient Name: [name] Date of Service: 07/05/2016 1:30 PM Medical Record Patient Account Number: [id number] Number: Treating RN: Ahmed Prima 1919-03-08 (82 y.o. Other Clinician: Date of Birth/Sex: Female) Treating ROBSON, [place] Primary Care Physician: Lorelee Market Physician/Extender: G Referring Physician: Vinson Moselle in Treatment: 12 Active Inactive Abuse / Safety / Falls / Self Care Management Nursing Diagnoses: Potential for falls Goals: Patient will remain injury free Date Initiated: 04/06/2016 Goal Status: Active Interventions: Assess fall risk on admission and as needed Notes: Nutrition Nursing Diagnoses: Imbalanced nutrition Goals: Patient/caregiver agrees to and verbalizes understanding of need to use nutritional supplements and/or vitamins as prescribed Date Initiated: 04/06/2016 Goal Status: Active Interventions: Assess patient nutrition upon admission and as needed per policy Notes: Orientation to the Wound Care Program Nursing Diagnoses: Knowledge deficit related to the wound healing center program VIANKA, YLITALO ([id number]) Goals: Patient/caregiver will verbalize understanding of the [place] Program Date  Initiated: 04/06/2016 Goal Status: Active Interventions: Provide education on orientation to the wound center Notes: Pain, Acute or Chronic Nursing Diagnoses: Pain, acute or chronic: actual or potential Potential alteration in comfort, pain Goals: Patient will verbalize adequate pain control and receive pain control interventions during procedures as needed Date Initiated: 04/06/2016 Goal Status: Active Interventions: Assess comfort goal upon admission Complete pain assessment as per visit requirements Notes: Wound/Skin Impairment Nursing Diagnoses: Impaired tissue integrity Goals: Ulcer/skin breakdown will have a volume reduction of 30% by week 4 Date Initiated: 04/06/2016 Goal Status: Active Ulcer/skin breakdown will have a volume reduction of 50% by week 8 Date Initiated: 04/06/2016 Goal Status: Active Ulcer/skin breakdown will have a volume reduction of 80% by week 12 Date Initiated: 04/06/2016 Goal Status: Active Interventions: Assess patient/caregiver ability to obtain necessary supplies [place], Khaylee ([id number]) Assess ulceration(s) every visit Notes: Electronic Signature(s) Signed: 07/05/2016 4:46:10 PM By: Alric Quan Entered By: Alric Quan on 07/05/2016 13:50:51 [name] ([id number]) -------------------------------------------------------------------------------- Pain Assessment Details Patient Name: [name] Date of Service: 07/05/2016 1:30 PM Medical Record Patient Account Number: [id number] Number: Treating RN: Ahmed Prima 28-Oct-1918 (82 y.o. Other Clinician: Date of Birth/Sex: Female) Treating ROBSON, [place] Primary Care Physician: Lorelee Market Physician/Extender: G Referring Physician: Lorelee Market Weeks in Treatment: 12 Active Problems Location of Pain Severity and Description of Pain Patient Has Paino No Site Locations With Dressing Change: No Pain Management and Medication Current Pain  Management: Electronic Signature(s) Signed: 07/05/2016 4:46:10 PM By: Alric Quan Entered By: Alric Quan  on 07/05/2016 13:40:05 ARMELLE, MUCCI ([id number]) -------------------------------------------------------------------------------- Wound Assessment Details Patient Name: ROMESHA, KUMP Date of Service: 07/05/2016 1:30 PM Medical Record Patient Account Number: [id number] Number: Treating RN: Ahmed Prima 07-Apr-1919 (82 y.o. Other Clinician: Date of Birth/Sex: Female) Treating ROBSON, [place] Primary Care Physician: Lorelee Market Physician/Extender: G Referring Physician: Lorelee Market Weeks in Treatment: 12 Wound Status Wound Number: 1 Primary Arterial Insufficiency Ulcer Etiology: Wound Location: Left Malleolus Wound Status: Open Wounding Event: Gradually Appeared Comorbid Cataracts, Hypertension, Date Acquired: 04/07/2015 History: Osteoarthritis Weeks Of Treatment: 12 Clustered Wound: No Photos Photo Uploaded By: Alric Quan on 07/05/2016 16:40:49 Wound Measurements Length: (cm) 0.8 % Reduction in Width: (cm) 0.6 % Reduction in Depth: (cm) 0.2 Epithelializati Area: (cm) 0.377 Tunneling: Volume: (cm) 0.075 Undermining: Area: 2.1% Volume: 2.6% on: None No No Wound Description Classification: Partial Thickness Foul Odor Afte Wound Margin: Thickened Exudate Amount: Large Exudate Type: Serous Exudate Color: amber r Cleansing: No Wound Bed Granulation Amount: None Present (0%) Exposed Structure Necrotic Amount: Large (67-100%) Fascia Exposed: No Necrotic Quality: Adherent Slough Fat Layer Exposed: No Dierks, Elasia ([id number]) Tendon Exposed: No Muscle Exposed: No Joint Exposed: No Bone Exposed: No Limited to Skin Breakdown Periwound Skin Texture Texture Color No Abnormalities Noted: No No Abnormalities Noted: No Localized Edema: Yes Erythema: Yes Erythema Location: Circumferential Moisture No  Abnormalities Noted: No Temperature / Pain Maceration: Yes Temperature: No Abnormality Moist: Yes Tenderness on Palpation: Yes Wound Preparation Ulcer Cleansing: Rinsed/Irrigated with Saline Topical Anesthetic Applied: Other: lidocaine 4%, Electronic Signature(s) Signed: 07/05/2016 4:46:10 PM By: Alric Quan Entered By: Alric Quan on 07/05/2016 13:48:54 [name] ([id number]) -------------------------------------------------------------------------------- Wound Assessment Details Patient Name: [name] Date of Service: 07/05/2016 1:30 PM Medical Record Patient Account Number: [id number] Number: Treating RN: Ahmed Prima 1918/11/08 (82 y.o. Other Clinician: Date of Birth/Sex: Female) Treating ROBSON, [place] Primary Care Physician: Lorelee Market Physician/Extender: G Referring Physician: Lorelee Market Weeks in Treatment: 12 Wound Status Wound Number: 2 Primary Arterial Insufficiency Ulcer Etiology: Wound Location: Left Toe Fifth - Lateral Wound Status: Open Wounding Event: Gradually Appeared Comorbid Cataracts, Hypertension, Date Acquired: 04/07/2015 History: Osteoarthritis Weeks Of Treatment: 12 Clustered Wound: No Photos Photo Uploaded By: Alric Quan on 07/05/2016 16:41:07 Wound Measurements Length: (cm) 0.5 Width: (cm) 0.4 Depth: (cm) 0.2 Area: (cm) 0.157 Volume: (cm) 0.031 % Reduction in Area: -234% % Reduction in Volume: -244.4% Epithelialization: None Wound Description Classification: Partial Thickness Foul Odor Afte Wound Margin: Thickened Exudate Amount: Large Exudate Type: Serous Exudate Color: amber r Cleansing: No Wound Bed Granulation Amount: None Present (0%) Exposed Structure Necrotic Amount: Large (67-100%) Fascia Exposed: No Necrotic Quality: Adherent Slough Fat Layer Exposed: No Speros, Lucas ([id number]) Tendon Exposed: No Muscle Exposed: No Joint Exposed: No Bone Exposed:  No Limited to Skin Breakdown Periwound Skin Texture Texture Color No Abnormalities Noted: No No Abnormalities Noted: No Callus: No Atrophie Blanche: No Crepitus: No Cyanosis: No Excoriation: No Ecchymosis: No Fluctuance: No Erythema: No Friable: No Hemosiderin Staining: No Induration: No Mottled: Yes Localized Edema: Yes Pallor: No Rash: No Rubor: No Scarring: No Temperature / Pain Moisture Temperature: No Abnormality No Abnormalities Noted: No Tenderness on Palpation: Yes Dry / Scaly: No Maceration: Yes Moist: Yes Wound Preparation Ulcer Cleansing: Rinsed/Irrigated with Saline Topical Anesthetic Applied: Other: lidocaine 4%, Electronic Signature(s) Signed: 07/05/2016 4:46:10 PM By: Alric Quan Entered By: Alric Quan on 07/05/2016 13:50:43 [name] ([id number]) -------------------------------------------------------------------------------- Vitals Details Patient Name: [name] Date of Service: 07/05/2016 1:30 PM Medical Record Patient Account Number: [id number] Number:  Treating RN: Carolyne Fiscal, Debi Apr 23, 1919 (82 y.o. Other Clinician: Date of Birth/Sex: Female) Treating ROBSON, [place] Primary Care Physician: Lorelee Market Physician/Extender: G Referring Physician: Lorelee Market Weeks in Treatment: 12 Vital Signs Time Taken: 13:40 Temperature (F): 97.6 Height (in): 64 Pulse (bpm): 83 Weight (lbs): 158 Respiratory Rate (breaths/min): 18 Body Mass Index (BMI): 27.1 Blood Pressure (mmHg): 143/54 Reference Range: 80 - 120 mg / dl Notes Made Dr. Dellia Nims aware of diastolic BP. Electronic Signature(s) Signed: 07/05/2016 4:46:10 PM By: Alric Quan Entered By: Alric Quan on 07/05/2016 13:42:29

## 2016-07-14 ENCOUNTER — Encounter: Payer: Self-pay | Admitting: Cardiovascular Disease

## 2016-07-14 ENCOUNTER — Ambulatory Visit (INDEPENDENT_AMBULATORY_CARE_PROVIDER_SITE_OTHER): Payer: Medicare PPO | Admitting: Cardiovascular Disease

## 2016-07-14 VITALS — BP 142/60 | HR 86 | Ht 64.0 in | Wt 148.0 lb

## 2016-07-14 DIAGNOSIS — I739 Peripheral vascular disease, unspecified: Secondary | ICD-10-CM | POA: Diagnosis not present

## 2016-07-14 DIAGNOSIS — I10 Essential (primary) hypertension: Secondary | ICD-10-CM | POA: Diagnosis not present

## 2016-07-14 MED ORDER — FUROSEMIDE 20 MG PO TABS: 20.0000 mg | ORAL_TABLET | Freq: Every day | ORAL | 0 refills | Status: DC

## 2016-07-14 MED ORDER — POTASSIUM CHLORIDE CRYS ER 20 MEQ PO TBCR: 20.0000 meq | EXTENDED_RELEASE_TABLET | Freq: Every day | ORAL | 0 refills | Status: DC

## 2016-07-14 NOTE — Patient Instructions (Signed)
Medication Instructions:  Your physician has recommended you make the following change in your medication:  TAKE lasix 20mg  once daily for 5 days TAKE potassium 27mEq once daily for 5 days   Labwork: none  Testing/Procedures: none  Follow-Up: Your physician recommends that you schedule a follow-up appointment in: 3 months with Dr. Fletcher Anon.    Any Other Special Instructions Will Be Listed Below (If Applicable).     If you need a refill on your cardiac medications before your next appointment, please call your pharmacy.

## 2016-07-14 NOTE — Progress Notes (Signed)
Cardiology Office Note   Date:  07/14/2016   ID:  [name], DOB 1919/09/08, MRN [id number]  PCP:  Lorelee Market, MD  Cardiologist:   [name], MD   Chief Complaint  Patient presents with  . other    1 month follow up. Meds reviewed by the pt. verbally. "doing well."       History of Present Illness: [name] is a 82 y.o. female who Is here today for a follow-up visit regarding peripheral arterial disease and nonhealing ulcer on the left foot. The patient has no previous cardiac history and no history of diabetes or tobacco use. She is known to have hypertension, hyperlipidemia and hypothyroidism. She developed 2 ulcers on the left foot about 10 months ago and these has not healed in spite of seeing multiple providers.  Noninvasive vascular evaluation showed an ABI of 0.55 on the right and 0.40 on the left. Duplex showed relatively long occlusion of the mid to distal left SFA with two-vessel runoff below the knee. I proceeded with angiography last month which showed no significant aortoiliac disease. There was long occlusion of the left SFA with 1 vessel runoff below the knee via the peroneal artery which was occluded but gave collaterals to the dorsalis pedis. I performed successful angioplasty of the left SFA followed by drug-coated balloon angioplasty and spot stenting with self-expanding stent in the midsegment. Postprocedure ABI showed improvement on the left side to 0.66. During last visit I prescribed Keflex for suspected cellulitis. There has been improvement in these ulcers and the pain is less. She has not started taking nortriptyline as she is taking tramadol. She continues to have swelling in both legs.     Past Medical History:  Diagnosis Date  . Arthritis    "legs" 05/18/2016)  . Breast cancer, right breast ([place])    mastectomy 30 yr ago  . DVT (deep venous thrombosis) ([place])    "found an old clot in her ?left leg when they were doing vascular  studies; put her on ASA" (05/18/2016)  . Family history of adverse reaction to anesthesia    "all the family get PONV" (05/18/2016)  . Hypertension   . Hypothyroidism   . kidney calculi   . PAD (peripheral artery disease) ([place])   . PONV (postoperative nausea and vomiting)   . Shortness of breath     Past Surgical History:  Procedure Laterality Date  . BALLOON ANGIOPLASTY, ARTERY Left 05/18/2016   SFA/notes 05/18/2016  . BREAST BIOPSY Right   . CATARACT EXTRACTION W/ INTRAOCULAR LENS  IMPLANT, BILATERAL Bilateral 2014  . CYSTOSCOPY W/ LITHOLAPAXY / [facility]    . CYSTOSCOPY W/ URETERAL STENT PLACEMENT  01/17/2012   Procedure: CYSTOSCOPY WITH RETROGRADE PYELOGRAM/URETERAL STENT PLACEMENT;  Surgeon: Franchot Gallo, MD;  Location: [facility];  Service: Urology;  Laterality: Left;  . CYSTOSCOPY W/ URETERAL STENT PLACEMENT  11/10/2010   Archie Endo 11/10/2010  . CYSTOSCOPY W/ URETERAL STENT REMOVAL  11/22/2010   Archie Endo 11/22/2010  . EYE SURGERY    . FRACTURE SURGERY    . MASTECTOMY COMPLETE / SIMPLE Right   . PERIPHERAL VASCULAR CATHETERIZATION N/A 05/18/2016   Procedure: Abdominal Aortogram w/Lower Extremity;  Surgeon: Wellington Hampshire, MD;  Location: [facility];  Service: Cardiovascular;  Laterality: N/A;  . WRIST FRACTURE SURGERY Left      Current Outpatient Prescriptions  Medication Sig Dispense Refill  . amLODipine (NORVASC) 5 MG tablet Take 5 mg by mouth daily with breakfast.     .  aspirin EC 81 MG tablet Take 81 mg by mouth daily.    . cetirizine (ZYRTEC) 10 MG tablet Take 10 mg by mouth daily.    . clopidogrel (PLAVIX) 75 MG tablet Take 1 tablet (75 mg total) by mouth daily with breakfast. 30 tablet 5  . levothyroxine (SYNTHROID, LEVOTHROID) 50 MCG tablet Take 50 mcg by mouth daily with breakfast.     . lidocaine-prilocaine (EMLA) cream Apply 1 application topically as needed. Dressing change    . losartan (COZAAR) 50 MG tablet Take 50 mg by mouth daily with breakfast.     . nortriptyline  (PAMELOR) 25 MG capsule Take 1 capsule (25 mg total) by mouth at bedtime. 30 capsule 1  . furosemide (LASIX) 20 MG tablet Take 1 tablet (20 mg total) by mouth daily. For 5 days 5 tablet 0  . potassium chloride SA (K-DUR,KLOR-CON) 20 MEQ tablet Take 1 tablet (20 mEq total) by mouth daily. For 5 days 5 tablet 0   No current facility-administered medications for this visit.     Allergies:   Codeine    Social History:  The patient  reports that she has never smoked. She has never used smokeless tobacco. She reports that she does not drink alcohol or use drugs.   Family History:  The patient's Family history is remarkable for coronary artery disease.   ROS:  Please see the history of present illness.   Otherwise, review of systems are positive for none.   All other systems are reviewed and negative.    PHYSICAL EXAM: VS:  BP (!) 142/60 (BP Location: Left Arm, Patient Position: Sitting, Cuff Size: Normal)   Pulse 86   Ht 5\' 4"  (1.626 m)   Wt 148 lb (67.1 kg)   BMI 25.40 kg/m  , BMI Body mass index is 25.4 kg/m. GEN: Well nourished, well developed, in no acute distress  HEENT: normal  Neck: no JVD, carotid bruits, or masses Cardiac: RRR; no murmurs, rubs, or gallops,no edema  Respiratory:  clear to auscultation bilaterally, normal work of breathing GI: soft, nontender, nondistended, + BS MS: no deformity or atrophy  Skin: warm and dry, no rash Neuro:  Strength and sensation are intact Psych: euthymic mood, full affect Vascular: Femoral pulses are normal bilaterally. Distal pulses are not palpable. There is a small 3 x 3 mm superficial ulcer on the left small toe. There is a slightly beagle are sore at 4 x 4 millimeter superficial ulceration on the lateral ankle   EKG:  EKG is not ordered today.   Recent Labs: 05/16/2016: BUN 17; Potassium 3.5; Sodium 142 05/18/2016: Creatinine, Ser 0.63; Hemoglobin 12.6; Platelets 273    Lipid Panel No results found for: CHOL, TRIG, HDL,  CHOLHDL, VLDL, LDLCALC, LDLDIRECT    Wt Readings from Last 3 Encounters:  07/14/16 148 lb (67.1 kg)  06/14/16 148 lb 8 oz (67.4 kg)  05/19/16 155 lb 6.8 oz (70.5 kg)      Other studies Reviewed: Additional studies/ records that were reviewed today include: Office note from the wound center.    ASSESSMENT AND PLAN:  1.  Critical limb ischemia with nonhealing ulcer on the left foot (Rutherford class V). Status post successful angioplasty to the left SFA with stent placement. ABI improved from 0.4 to 0.66. She still has significant below the knee disease with mainly one-vessel runoff. There has been good improvement in the ulcers overall and the pain is less. Continue current management. Reserve intervention on her tibial vessels for  nonhealing especially with her age. Her leg edema seems to be causing significant pain. I elected to give her a 5 day course of Lasix 20 mg daily with potassium 20 mEq daily.  2. Essential hypertension: Blood pressure is controlled on current medications. Continue treatment with losartan and amlodipine.  Disposition:   FU with me in 3 months  Signed,  [name], MD  07/14/2016 2:55 PM    [place]

## 2016-07-15 ENCOUNTER — Ambulatory Visit: Payer: Medicare PPO | Admitting: Cardiovascular Disease

## 2016-07-19 ENCOUNTER — Encounter: Payer: Medicare PPO | Attending: Internal Medicine | Admitting: Internal Medicine

## 2016-07-19 DIAGNOSIS — L97523 Non-pressure chronic ulcer of other part of left foot with necrosis of muscle: Secondary | ICD-10-CM | POA: Insufficient documentation

## 2016-07-19 DIAGNOSIS — I70243 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: Secondary | ICD-10-CM | POA: Insufficient documentation

## 2016-07-19 DIAGNOSIS — E039 Hypothyroidism, unspecified: Secondary | ICD-10-CM | POA: Diagnosis not present

## 2016-07-19 DIAGNOSIS — L97211 Non-pressure chronic ulcer of right calf limited to breakdown of skin: Secondary | ICD-10-CM | POA: Diagnosis not present

## 2016-07-19 DIAGNOSIS — I70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: Secondary | ICD-10-CM | POA: Diagnosis present

## 2016-07-20 NOTE — Progress Notes (Signed)
SYVANNAH, PRIVE ([id number]) Visit Report for 07/19/2016 Arrival Information Details Patient Name: [name], [name] Date of Service: 07/19/2016 10:45 AM Medical Record Number: [id number] Patient Account Number: [id number] Date of Birth/Sex: 05-Jan-1919 (82 y.o. Female) Treating RN: Ahmed Prima Primary Care Physician: Lorelee Market Other Clinician: Referring Physician: Lorelee Market Treating Physician/Extender: Melburn Hake, HOYT Weeks in Treatment: 14 Visit Information History Since Last Visit All ordered tests and consults were completed: No Patient Arrived: Ambulatory Added or deleted any medications: No Arrival Time: 10:51 Any new allergies or adverse reactions: No Accompanied By: daughter Had a fall or experienced change in No Transfer Assistance: None activities of daily living that may affect Patient Identification Verified: Yes risk of falls: Secondary Verification Process Yes Signs or symptoms of abuse/neglect since last No Completed: visito Patient Requires Transmission- No Hospitalized since last visit: No Based Precautions: Pain Present Now: No Patient Has Alerts: Yes Patient Alerts: 7/31 ABI: R-0.55 L-0.40 [facility] Electronic Signature(s) Signed: 07/20/2016 1:21:57 PM By: Alric Quan Entered By: Alric Quan on 07/19/2016 12:40:10 [name] ([id number]) -------------------------------------------------------------------------------- Encounter Discharge Information Details Patient Name: [name] Date of Service: 07/19/2016 10:45 AM Medical Record Number: [id number] Patient Account Number: [id number] Date of Birth/Sex: 1919-03-07 (82 y.o. Female) Treating RN: Ahmed Prima Primary Care Physician: Lorelee Market Other Clinician: Referring Physician: Lorelee Market Treating Physician/Extender: Melburn Hake, HOYT Weeks in Treatment: 14 Encounter Discharge Information Items Discharge Pain Level: 0 Discharge  Condition: Stable Ambulatory Status: Ambulatory Discharge Destination: Home Transportation: Private Auto Accompanied By: daughter Schedule Follow-up Appointment: Yes Medication Reconciliation completed and provided to Patient/Care Yes Alaiya Martindelcampo: Provided on Clinical Summary of Care: 07/19/2016 Form Type Recipient Paper Patient [facility] Electronic Signature(s) Signed: 07/20/2016 1:21:57 PM By: Alric Quan Previous Signature: 07/19/2016 11:45:38 AM Version By: Ruthine Dose Entered By: Alric Quan on 07/19/2016 12:43:22 [name] ([id number]) -------------------------------------------------------------------------------- Lower Extremity Assessment Details Patient Name: [name] Date of Service: 07/19/2016 10:45 AM Medical Record Number: [id number] Patient Account Number: [id number] Date of Birth/Sex: 02/24/1919 (82 y.o. Female) Treating RN: Ahmed Prima Primary Care Physician: Lorelee Market Other Clinician: Referring Physician: Lorelee Market Treating Physician/Extender: Melburn Hake, HOYT Weeks in Treatment: 14 Vascular Assessment Pulses: Posterior Tibial Dorsalis Pedis Palpable: [Left:Yes] Extremity colors, hair growth, and conditions: Extremity Color: [Left:Red] Temperature of Extremity: [Left:Warm] Capillary Refill: [Left:< 3 seconds] Electronic Signature(s) Signed: 07/20/2016 1:21:57 PM By: Alric Quan Entered By: Alric Quan on 07/19/2016 12:40:28 [name] ([id number]) -------------------------------------------------------------------------------- Multi Wound Chart Details Patient Name: [name] Date of Service: 07/19/2016 10:45 AM Medical Record Number: [id number] Patient Account Number: [id number] Date of Birth/Sex: 1918-10-20 (82 y.o. Female) Treating RN: Ahmed Prima Primary Care Physician: Lorelee Market Other Clinician: Referring Physician: Lorelee Market Treating Physician/Extender: Melburn Hake,  HOYT Weeks in Treatment: 14 Vital Signs Height(in): 64 Pulse(bpm): 79 Weight(lbs): 158 Blood Pressure 150/49 (mmHg): Body Mass Index(BMI): 27 Temperature(F): 97.8 Respiratory Rate 18 (breaths/min): Photos: [N/A:N/A] Wound Location: Left Malleolus Left Toe Fifth - Lateral N/A Wounding Event: Gradually Appeared Gradually Appeared N/A Primary Etiology: Arterial Insufficiency Ulcer Arterial Insufficiency Ulcer N/A Comorbid History: Cataracts, Hypertension, Cataracts, Hypertension, N/A Osteoarthritis Osteoarthritis Date Acquired: 04/07/2015 04/07/2015 N/A Weeks of Treatment: 14 14 N/A Wound Status: Open Open N/A Measurements L x W x D 0.4x0.3x0.2 0.3x0.7x0.3 N/A (cm) Area (cm) : 0.094 0.165 N/A Volume (cm) : 0.019 0.049 N/A % Reduction in Area: 75.60% -251.10% N/A % Reduction in Volume: 75.30% -444.40% N/A Classification: Partial Thickness Partial Thickness N/A Exudate Amount: Large Large N/A Exudate Type: Serous Serous N/A Exudate Color: amber amber N/A Wound  Margin: Thickened Thickened N/A Granulation Amount: Medium (34-66%) None Present (0%) N/A Granulation Quality: Pink N/A N/A Necrotic Amount: Medium (34-66%) Large (67-100%) N/A Rawson, Claudetta ([id number]) Exposed Structures: Fascia: No Fascia: No N/A Fat: No Fat: No Tendon: No Tendon: No Muscle: No Muscle: No Joint: No Joint: No Bone: No Bone: No Limited to Skin Limited to Skin Breakdown Breakdown Epithelialization: None None N/A Debridement: Debridement [id number] Debridement [id number] N/A [id number]) 11047) Pre-procedure 11:22 11:22 N/A Verification/Time Out Taken: Pain Control: Lidocaine 4% Topical Lidocaine 4% Topical N/A Solution Solution Tissue Debrided: Fibrin/Slough, Exudates, Fibrin/Slough, Exudates, N/A Subcutaneous Subcutaneous Level: Skin/Subcutaneous Skin/Subcutaneous N/A Tissue Tissue Debridement Area (sq 0.12 0.21 N/A cm): Instrument: Curette Curette N/A Bleeding: Minimum Minimum  N/A Hemostasis Achieved: Pressure Pressure N/A Procedural Pain: 0 0 N/A Post Procedural Pain: 0 0 N/A Debridement Treatment Procedure was tolerated Procedure was tolerated N/A Response: well well Post Debridement 0.4x0.3x0.3 0.3x0.7x0.3 N/A Measurements L x W x D (cm) Post Debridement 0.028 0.049 N/A Volume: (cm) Periwound Skin Texture: Edema: Yes Edema: Yes N/A Excoriation: No Induration: No Callus: No Crepitus: No Fluctuance: No Friable: No Rash: No Scarring: No Periwound Skin Maceration: Yes Maceration: Yes N/A Moisture: Moist: Yes Moist: Yes Dry/Scaly: No Periwound Skin Color: Erythema: Yes Mottled: Yes N/A Atrophie Blanche: No Cyanosis: No Ecchymosis: No Barrero, Cherine ([id number]) Erythema: No Hemosiderin Staining: No Pallor: No Rubor: No Erythema Location: Circumferential N/A N/A Temperature: No Abnormality No Abnormality N/A Tenderness on Yes Yes N/A Palpation: Wound Preparation: Ulcer Cleansing: Ulcer Cleansing: N/A Rinsed/Irrigated with Rinsed/Irrigated with Saline Saline Topical Anesthetic Topical Anesthetic Applied: Other: lidocaine Applied: Other: lidocaine 4% 4% Procedures Performed: Debridement Debridement N/A Treatment Notes Wound #1 (Left Malleolus) 1. Cleansed with: Clean wound with Normal Saline 2. Anesthetic Topical Lidocaine 4% cream to wound bed prior to debridement 3. Peri-wound Care: Skin Prep 4. Dressing Applied: Iodosorb Ointment 5. Secondary Dressing Applied Bordered Foam Dressing Dry Gauze Wound #2 (Left, Lateral Toe Fifth) 1. Cleansed with: Clean wound with Normal Saline 2. Anesthetic Topical Lidocaine 4% cream to wound bed prior to debridement 3. Peri-wound Care: Skin Prep 4. Dressing Applied: Iodosorb Ointment 5. Secondary Dressing Applied Bordered Foam Dressing Dry Gauze Electronic Signature(s) Signed: 07/20/2016 1:21:57 PM By: Myriam Jacobson, Estill Bamberg ([id number]) Entered By: Alric Quan on  07/19/2016 12:41:25 [name] ([id number]) -------------------------------------------------------------------------------- Multi-Disciplinary Care Plan Details Patient Name: [name] Date of Service: 07/19/2016 10:45 AM Medical Record Number: [id number] Patient Account Number: [id number] Date of Birth/Sex: Jan 20, 1919 (82 y.o. Female) Treating RN: Ahmed Prima Primary Care Physician: Lorelee Market Other Clinician: Referring Physician: Lorelee Market Treating Physician/Extender: Melburn Hake, HOYT Weeks in Treatment: 14 Active Inactive Abuse / Safety / Falls / Self Care Management Nursing Diagnoses: Potential for falls Goals: Patient will remain injury free Date Initiated: 04/06/2016 Goal Status: Active Interventions: Assess fall risk on admission and as needed Notes: Nutrition Nursing Diagnoses: Imbalanced nutrition Goals: Patient/caregiver agrees to and verbalizes understanding of need to use nutritional supplements and/or vitamins as prescribed Date Initiated: 04/06/2016 Goal Status: Active Interventions: Assess patient nutrition upon admission and as needed per policy Notes: Orientation to the Wound Care Program Nursing Diagnoses: Knowledge deficit related to the wound healing center program Goals: Patient/caregiver will verbalize understanding of the [facility] ([id number]) Date Initiated: 04/06/2016 Goal Status: Active Interventions: Provide education on orientation to the wound center Notes: Pain, Acute or Chronic Nursing Diagnoses: Pain, acute or chronic: actual or potential Potential alteration in comfort, pain Goals: Patient will verbalize adequate pain control and  receive pain control interventions during procedures as needed Date Initiated: 04/06/2016 Goal Status: Active Interventions: Assess comfort goal upon admission Complete pain assessment as per visit requirements Notes: Wound/Skin  Impairment Nursing Diagnoses: Impaired tissue integrity Goals: Ulcer/skin breakdown will have a volume reduction of 30% by week 4 Date Initiated: 04/06/2016 Goal Status: Active Ulcer/skin breakdown will have a volume reduction of 50% by week 8 Date Initiated: 04/06/2016 Goal Status: Active Ulcer/skin breakdown will have a volume reduction of 80% by week 12 Date Initiated: 04/06/2016 Goal Status: Active Interventions: Assess patient/caregiver ability to obtain necessary supplies Assess ulceration(s) every visit Notes: AVI, FELVER ([id number]) Electronic Signature(s) Signed: 07/20/2016 1:21:57 PM By: Alric Quan Entered By: Alric Quan on 07/19/2016 12:41:12 [name] ([id number]) -------------------------------------------------------------------------------- Pain Assessment Details Patient Name: [name] Date of Service: 07/19/2016 10:45 AM Medical Record Number: [id number] Patient Account Number: [id number] Date of Birth/Sex: 1918-11-18 (82 y.o. Female) Treating RN: Ahmed Prima Primary Care Physician: Lorelee Market Other Clinician: Referring Physician: Lorelee Market Treating Physician/Extender: Melburn Hake, HOYT Weeks in Treatment: 14 Active Problems Location of Pain Severity and Description of Pain Patient Has Paino No Site Locations With Dressing Change: No Pain Management and Medication Current Pain Management: Electronic Signature(s) Signed: 07/20/2016 1:21:57 PM By: Alric Quan Entered By: Alric Quan on 07/19/2016 12:40:16 [name] ([id number]) -------------------------------------------------------------------------------- Patient/Caregiver Education Details Patient Name: [name] Date of Service: 07/19/2016 10:45 AM Medical Record Number: [id number] Patient Account Number: [id number] Date of Birth/Gender: 1918/11/09 (82 y.o. Female) Treating RN: Ahmed Prima Primary Care Physician: Lorelee Market Other Clinician: Referring Physician: Lorelee Market Treating Physician/Extender: Sharalyn Ink in Treatment: 14 Education Assessment Education Provided To: Patient Education Topics Provided Wound/Skin Impairment: Handouts: Other: change dressing as ordered Methods: Demonstration, Explain/Verbal Responses: State content correctly Electronic Signature(s) Signed: 07/20/2016 1:21:57 PM By: Alric Quan Entered By: Alric Quan on 07/19/2016 12:43:28 [name] ([id number]) -------------------------------------------------------------------------------- Wound Assessment Details Patient Name: [name] Date of Service: 07/19/2016 10:45 AM Medical Record Number: [id number] Patient Account Number: [id number] Date of Birth/Sex: 02-Oct-1919 (82 y.o. Female) Treating RN: Ahmed Prima Primary Care Physician: Lorelee Market Other Clinician: Referring Physician: Lorelee Market Treating Physician/Extender: Melburn Hake, HOYT Weeks in Treatment: 14 Wound Status Wound Number: 1 Primary Arterial Insufficiency Ulcer Etiology: Wound Location: Left Malleolus Wound Status: Open Wounding Event: Gradually Appeared Comorbid Cataracts, Hypertension, Date Acquired: 04/07/2015 History: Osteoarthritis Weeks Of Treatment: 14 Clustered Wound: No Photos Wound Measurements Length: (cm) 0.4 Width: (cm) 0.3 Depth: (cm) 0.2 Area: (cm) 0.094 Volume: (cm) 0.019 % Reduction in Area: 75.6% % Reduction in Volume: 75.3% Epithelialization: None Tunneling: No Undermining: No Wound Description Classification: Partial Thickness Wound Margin: Thickened Exudate Amount: Large Exudate Type: Serous Exudate Color: amber Foul Odor After Cleansing: No Wound Bed Granulation Amount: Medium (34-66%) Exposed Structure Granulation Quality: Pink Fascia Exposed: No Necrotic Amount: Medium (34-66%) Fat Layer Exposed: No Necrotic Quality: Adherent Slough Tendon  Exposed: No Muscle Exposed: No Mattila, Elani ([id number]) Joint Exposed: No Bone Exposed: No Limited to Skin Breakdown Periwound Skin Texture Texture Color No Abnormalities Noted: No No Abnormalities Noted: No Localized Edema: Yes Erythema: Yes Erythema Location: Circumferential Moisture No Abnormalities Noted: No Temperature / Pain Maceration: Yes Temperature: No Abnormality Moist: Yes Tenderness on Palpation: Yes Wound Preparation Ulcer Cleansing: Rinsed/Irrigated with Saline Topical Anesthetic Applied: Other: lidocaine 4%, Treatment Notes Wound #1 (Left Malleolus) 1. Cleansed with: Clean wound with Normal Saline 2. Anesthetic Topical Lidocaine 4% cream to wound bed prior to debridement 3. Peri-wound Care: Skin Prep 4. Dressing Applied:  Iodosorb Ointment 5. Secondary Dressing Applied Bordered Foam Dressing Dry Gauze Electronic Signature(s) Signed: 07/20/2016 1:21:57 PM By: Alric Quan Entered By: Alric Quan on 07/19/2016 12:40:45 [name] ([id number]) -------------------------------------------------------------------------------- Wound Assessment Details Patient Name: [name] Date of Service: 07/19/2016 10:45 AM Medical Record Number: [id number] Patient Account Number: [id number] Date of Birth/Sex: [birth year] (82 y.o. Female) Treating RN: Ahmed Prima Primary Care Physician: Lorelee Market Other Clinician: Referring Physician: Lorelee Market Treating Physician/Extender: Melburn Hake, HOYT Weeks in Treatment: 14 Wound Status Wound Number: 2 Primary Arterial Insufficiency Ulcer Etiology: Wound Location: Left Toe Fifth - Lateral Wound Status: Open Wounding Event: Gradually Appeared Comorbid Cataracts, Hypertension, Date Acquired: 04/07/2015 History: Osteoarthritis Weeks Of Treatment: 14 Clustered Wound: No Photos Wound Measurements Length: (cm) 0.3 Width: (cm) 0.7 Depth: (cm) 0.3 Area: (cm) 0.165 Volume: (cm)  0.049 % Reduction in Area: -251.1% % Reduction in Volume: -444.4% Epithelialization: None Tunneling: No Undermining: No Wound Description Classification: Partial Thickness Foul Odor Af Wound Margin: Thickened Exudate Amount: Large Exudate Type: Serous Exudate Color: amber ter Cleansing: No Wound Bed Granulation Amount: None Present (0%) Exposed Structure Necrotic Amount: Large (67-100%) Fascia Exposed: No Necrotic Quality: Adherent Slough Fat Layer Exposed: No Tendon Exposed: No Muscle Exposed: No Fauth, [place] ([id number]) Joint Exposed: No Bone Exposed: No Limited to Skin Breakdown Periwound Skin Texture Texture Color No Abnormalities Noted: No No Abnormalities Noted: No Callus: No Atrophie Blanche: No Crepitus: No Cyanosis: No Excoriation: No Ecchymosis: No Fluctuance: No Erythema: No Friable: No Hemosiderin Staining: No Induration: No Mottled: Yes Localized Edema: Yes Pallor: No Rash: No Rubor: No Scarring: No Temperature / Pain Moisture Temperature: No Abnormality No Abnormalities Noted: No Tenderness on Palpation: Yes Dry / Scaly: No Maceration: Yes Moist: Yes Wound Preparation Ulcer Cleansing: Rinsed/Irrigated with Saline Topical Anesthetic Applied: Other: lidocaine 4%, Treatment Notes Wound #2 (Left, Lateral Toe Fifth) 1. Cleansed with: Clean wound with Normal Saline 2. Anesthetic Topical Lidocaine 4% cream to wound bed prior to debridement 3. Peri-wound Care: Skin Prep 4. Dressing Applied: Iodosorb Ointment 5. Secondary Dressing Applied Bordered Foam Dressing Dry Gauze Electronic Signature(s) Signed: 07/20/2016 1:21:57 PM By: Alric Quan Entered By: Alric Quan on 07/19/2016 12:40:57 [name] ([id number]) -------------------------------------------------------------------------------- Vitals Details Patient Name: [name] Date of Service: 07/19/2016 10:45 AM Medical Record Number: [id number] Patient  Account Number: [id number] Date of Birth/Sex: [birth year] (82 y.o. Female) Treating RN: Ahmed Prima Primary Care Physician: Lorelee Market Other Clinician: Referring Physician: Lorelee Market Treating Physician/Extender: Melburn Hake, HOYT Weeks in Treatment: 14 Vital Signs Time Taken: 10:53 Temperature (F): 97.8 Height (in): 64 Pulse (bpm): 79 Weight (lbs): 158 Respiratory Rate (breaths/min): 18 Body Mass Index (BMI): 27.1 Blood Pressure (mmHg): 150/49 Reference Range: 80 - 120 mg / dl Notes Veneda Melter, PA aware of BP. Electronic Signature(s) Signed: 07/20/2016 1:21:57 PM By: Alric Quan Entered By: Alric Quan on 07/19/2016 12:40:19

## 2016-07-21 NOTE — Progress Notes (Signed)
[name] ([id number]) Visit Report for 07/19/2016 Chief Complaint Document Details Patient Name: [name], [name] 07/19/2016 10:45 Date of Service: AM Medical Record [id number] Number: Patient Account Number: [id number] May 04, 1919 (82 y.o. Treating RN: [name] Date of Birth/Sex: Female) Other Clinician: Primary Care Physician: [name] Treating [name] Referring Physician: Lorelee [name] Physician/Extender: [name] in Treatment: 14 Information Obtained from: Patient Chief Complaint chronic wounds on the left lateral ankle and left 5th toe that been present for roughly a year Electronic Signature(s) Signed: 07/20/2016 9:39:47 AM By: Worthy Keeler PA-C Entered By: Worthy Keeler on 07/19/2016 14:48:36 [name] ([id number]) -------------------------------------------------------------------------------- Debridement Details Patient Name: [name], [name] 07/19/2016 10:45 Date of Service: AM Medical Record [id number] Number: Patient Account Number: [id number] June 18, 1919 (82 y.o. Treating RN: [name] Date of Birth/Sex: Female) Other Clinician: Primary Care Physician: [name] Treating [name] Referring Physician: Lorelee [name] Physician/Extender: [name] in Treatment: 14 Debridement Performed for Wound #1 Left Malleolus Assessment: Performed By: Physician [name] III, Pleasant Britz E., PA-C Debridement: Debridement Pre-procedure Yes - 11:22 Verification/Time Out Taken: Start Time: 11:23 Pain Control: Lidocaine 4% Topical Solution Level: Skin/Subcutaneous Tissue Total Area Debrided (L x 0.4 (cm) x 0.3 (cm) = 0.12 (cm) W): Tissue and other Viable, Non-Viable, Exudate, Fibrin/Slough, Subcutaneous material debrided: Instrument: Curette Bleeding: Minimum Hemostasis Achieved: Pressure End Time: 11:25 Procedural Pain: 0 Post Procedural Pain: 0 Response to Treatment: Procedure was tolerated well Post  Debridement Measurements of Total Wound Length: (cm) 0.4 Width: (cm) 0.3 Depth: (cm) 0.3 Volume: (cm) 0.028 Character of Wound/Ulcer Post Requires Further Debridement Debridement: Severity of Tissue Post Debridement: Fat layer exposed Post Procedure Diagnosis Same as Pre-procedure Electronic Signature(s) Signed: 07/20/2016 4:35:45 PM By: [name] Signed: 07/20/2016 4:52:13 PM By: [name] ([id number]) Entered By: [name] on 07/20/2016 15:21:51 [name] ([id number]) -------------------------------------------------------------------------------- Debridement Details Patient Name: [name], [name] 07/19/2016 10:45 Date of Service: AM Medical Record [id number] Number: Patient Account Number: [id number] March 25, 1919 (82 y.o. Treating RN: [name] Date of Birth/Sex: Female) Other Clinician: Primary Care Physician: [name] Treating [name] Referring Physician: Lorelee [name] Physician/Extender: [name] in Treatment: 14 Debridement Performed for Wound #2 Left,Lateral Toe Fifth Assessment: Performed By: Physician [name] III, [name] Hill E., PA-C Debridement: Debridement Pre-procedure Yes - 11:22 Verification/Time Out Taken: Start Time: 11:26 Pain Control: Lidocaine 4% Topical Solution Level: Skin/Subcutaneous Tissue Total Area Debrided (L x 0.3 (cm) x 0.7 (cm) = 0.21 (cm) W): Tissue and other Viable, Non-Viable, Exudate, Fibrin/Slough, Subcutaneous material debrided: Instrument: Curette Bleeding: Minimum Hemostasis Achieved: Pressure End Time: 11:28 Procedural Pain: 0 Post Procedural Pain: 0 Response to Treatment: Procedure was tolerated well Post Debridement Measurements of Total Wound Length: (cm) 0.3 Width: (cm) 0.7 Depth: (cm) 0.3 Volume: (cm) 0.049 Character of Wound/Ulcer Post Requires Further Debridement Debridement: Severity of Tissue Post Debridement: Fat layer exposed Post  Procedure Diagnosis Same as Pre-procedure Electronic Signature(s) Signed: 07/20/2016 4:35:45 PM By: [name] Signed: 07/20/2016 4:52:13 PM By: [name] ([id number]) Entered By: [name] on 07/20/2016 15:22:46 [name] ([id number]) -------------------------------------------------------------------------------- HPI Details Patient Name: [name], [name] 07/19/2016 10:45 Date of Service: AM Medical Record [id number] Number: Patient Account Number: [id number] 1919-09-02 (82 y.o. Treating RN: [name] Date of Birth/Sex: Female) Other Clinician: Primary Care Physician: [name] Treating [name] III, [name] Referring Physician: Lorelee [name] Physician/Extender: [name] in Treatment: 14 History of Present Illness HPI Description: 04/06/16; this is a 82 year old woman who arrives accompanied by 2 daughters for a wound on her  left ankle and her left fifth toe. These have apparently been present for a year. I'm not quite certain how she came to this clinic however she was being followed by [name] her podiatrist for these wounds. She was also referred to [facility] and they apparently did a test presumably arterial studies although we don't have any of these results and we couldn't get through to the office today. The family but has been applying a combination of Bactroban and a light bandage and perhaps more recently Silvadene cream. She did have an x-ray of the foot roughly 6 months ago at the podiatry office the family was unaware that if there were any abnormalities. Apparently they have not seen any healing here. Our intake nurse noted a slight skin tear on the right anterior lower leg. Nobody seemed aware of this. ABIs calculated in this clinic was 0.3 on the right and 0.4 on the left I have reviewed things [facility]. There is very little information on this patient. She apparently follows in  current total clinic which we don't have information from. She has mentioned already been to a [facility]. She has a history of hypothyroidism, nephrolithiasis arthritis and has had a previous mastectomy. 04/13/16; patient's x-ray was normal. She is already been to see Dr. Delana Meyer vascular surgery. Her arterial exam was from November 2016 this showed a left ABI of 0.62 her right of 0.76. Her duplex ultrasound of the left leg showed biphasic waves in the common femoral and distal femoral artery however monophasic waves in the superficial femoral artery proximal and mid biphasic it distal. 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 The patient tells me that she has pain at night when she tries to lie down which is improved by getting up and sitting in the chair this sounds like claudication at rest. Her wounds are on the left medial malleolus and the dorsal fifth toe small punched out wounds that are right on bone. We use Santyl last week 04/20/16: nurse informed me pt has declined evaluation for significant PAD. she denies systemic s/s of infections. 04/27/16;; the patient has had noninvasive arterial studies done in November 2016. ABI and the left was 0.62. Monophasic waves at the superficial femoral artery. Occluded to the posterior tibial artery. So had greater than 50% stenosis of the right superficial femoral and greater than 50% stenosis of the left superficial femoral artery. She had bilateral tibial peroneal artery disease. Both of the wounds on the left fifth toe and left lateral malleolus have been present for more than a year. They have been to see vein and vascular. The patient has some pain but miraculously I think the wounds have largely been stable. No evidence of infection 05/04/16; she goes for a noninvasive study tomorrow and then sees Dr. Fletcher Anon on Monday. By the time she is here next week we should have a better picture of whether something can be done with regards to her arterial flow. We  continue to have ischemic-looking wounds on the left fifth toe and left lateral malleolus. 05/10/16; the patient went for her arterial studies and saw Dr. Fletcher Anon. As predicted she is felt to have critical limb ischemia. The feeling is that she has occlusion of the SFA. The feeling would she would be a Alldredge, Aribella ([id number]) candidate for a stent to the SFA. The patient did not make a decision to proceed with the procedure and she is here with family members to discuss this me today. He shouldn't  has a lot of pain and cannot sleep and rest well at night per her family. 05/24/16; the patient went and had a complex revascularization/angioplasty of the left superficial femoral artery followed by drug-coated balloon angioplasty and spots stenting. She tolerated the procedure well. She was recommended for dual antiplatelet drugs with Plavix and aspirin for at least a month. She went yesterday for I believe follow-up serial Dopplers and ABIs although I don't see these results. The patient is unfortunately complaining of a lot of pain in her bilateral lower legs below the knees from the ankle to the knees. She apparently was prescribed lidocaine and apparently put this on her legs instead of over the wound areas. This may have something to do with it however there is a lot of edema in her bilateral legs I was able to find her arterial studies from yesterday. The left ABI has improved up to 0.66 post left SFA stent. The bilateral great toe indices remain abnormal with the left being in the 0.25 range. Duplex ultrasound showed her left SFA stent is patent monophasic waveforms persist in the left leg 06/07/16; continued punched-out areas over the dorsal left fifth toe and left lateral malleolus. No major improvement 06/28/16; the areas over her dorsal left fifth toe and left lateral malleolus are covered in surface slough we are using Iodoflex 07/05/16. We have been using Iodoflex for 2-3 weeks now. I have  not been debridement is because of pain 07/19/16 currently we have been using Iodoflex for roughly the past month. Previously we were unable to debride due to pain although today patient states that the pain is not nearly as severe as it has been in the past. In fact she rates this to be a 1 out of 10 and it worse to a to 10 with palpation of the wound. All and all her and her daughter feel like this is actually doing steadily better at this point in time. She is pleased with progress and we have been seeing her every 2 weeks. Electronic Signature(s) Signed: 07/20/2016 9:39:47 AM By: Worthy Keeler PA-C Entered By: Worthy Keeler on 07/19/2016 14:49:43 [name] ([id number]) -------------------------------------------------------------------------------- Physical Exam Details Patient Name: [name], [name] 07/19/2016 10:45 Date of Service: AM Medical Record [id number] Number: Patient Account Number: [id number] 10-17-1918 (82 y.o. Treating RN: [name] Date of Birth/Sex: Female) Other Clinician: Primary Care Physician: [name] Treating [name] III, Candus Braud Referring Physician: Lorelee [name] Physician/Extender: [name] in Treatment: 68 Constitutional Well-nourished and well-hydrated in no acute distress. Respiratory normal breathing without difficulty. Cardiovascular 1+ dorsalis pedis/posterior tibialis pulses. trace pitting edema noted of the left lower extremity. Psychiatric this patient is able to make decisions and demonstrates good insight into disease process. Alert and Oriented x 3. pleasant and cooperative. Notes Patient's wound actually appears to be somewhat improved at this point in time today. This is excellent news currently we have been using Iodoflex over the wound. Dr. Quentin Cornwall was actually able to debride last week and I substernally was also able to debride the wounds today which appeared to be gaining a better surface especially in regard  to the toe although the lateral malleolus still has more work to do to get a sufficient granulation bed going. Electronic Signature(s) Signed: 07/20/2016 9:39:47 AM By: Worthy Keeler PA-C Entered By: Worthy Keeler on 07/19/2016 14:51:29 [name] ([id number]) -------------------------------------------------------------------------------- Physician Orders Details Patient Name: [name], [name] 07/19/2016 10:45 Date of Service: AM Medical Record [id number] Number: Patient Account Number: [id number] 27-Oct-1918 (82  y.o. Treating RN: Carolyne Fiscal, Debi Date of Birth/Sex: Female) Other Clinician: Primary Care Physician: [name] Treating [name] III, Roman Forest Referring Physician: Lorelee [name] Physician/Extender: [name] in Treatment: 14 Verbal / Phone Orders: Yes Clinician: Carolyne Fiscal, Debi Read Back and Verified: Yes Diagnosis Coding Wound Cleansing Wound #1 Left Malleolus o Clean wound with Normal Saline. - for clinic use o Cleanse wound with mild soap and water o May Shower, gently pat wound dry prior to applying new dressing. Wound #2 Left,Lateral Toe Fifth o Clean wound with Normal Saline. - for clinic use o Cleanse wound with mild soap and water o May Shower, gently pat wound dry prior to applying new dressing. Anesthetic Wound #1 Left Malleolus o Topical Lidocaine 4% cream applied to wound bed prior to debridement - for clinic use Wound #2 Left,Lateral Toe Fifth o Topical Lidocaine 4% cream applied to wound bed prior to debridement - for clinic use Primary Wound Dressing Wound #1 Left Malleolus o Iodosorb Ointment Wound #2 Left,Lateral Toe Fifth o Iodoflex Secondary Dressing Wound #1 Left Malleolus o Dry Gauze o Boardered Foam Dressing Wound #2 Left,Lateral Toe Fifth o Dry Gauze o Boardered Foam Dressing [name], [name] ([id number]) Dressing Change Frequency Wound #1 Left Malleolus o Change dressing every other  day. Wound #2 Left,Lateral Toe Fifth o Change dressing every other day. Follow-up Appointments Wound #1 Left Malleolus o Return Appointment in 2 weeks. Wound #2 Left,Lateral Toe Fifth o Return Appointment in 2 weeks. Edema Control Wound #1 Left Malleolus o Elevate legs to the level of the heart and pump ankles as often as possible o Other: - compression stockings Wound #2 Left,Lateral Toe Fifth o Elevate legs to the level of the heart and pump ankles as often as possible o Other: - compression stockings Additional Orders / Instructions Wound #1 Left Malleolus o Increase protein intake. Wound #2 Left,Lateral Toe Fifth o Increase protein intake. Medications-please add to medication list. Wound #1 Left Malleolus o Other: - Vitamin C, Zinc, Multivitamins Wound #2 Left,Lateral Toe Fifth o Other: - Vitamin C, Zinc, Multivitamins Tramadol Patient Medications Allergies: codeine Notifications Medication Indication Start End tramadol 07/19/2016 DOSE 1 - oral 50 mg tablet - 1 tablet oral at night for severe pain as needed [name], [name] ([id number]) Electronic Signature(s) Signed: 07/20/2016 9:39:47 AM By: Worthy Keeler PA-C Entered By: Worthy Keeler on 07/19/2016 14:45:36 [name] ([id number]) -------------------------------------------------------------------------------- Prescription 07/19/2016 Patient Name: [name] Physician: Worthy Keeler PA-C Date of Birth: December 20, 1918 NPI#: [id number] Sex: F DEA#: [id number] Phone #: [phone] License #: Patient Address: [place], [place] [address] [facility] [address], [place], [place] [address] [phone] Allergies codeine Reaction: sick on stomach Severity: Severe Medication Note: This prescription was automatically generated because the medication is a controlled substance, and cannot  be prescribed electronically. Medication: Route: Strength: Form: tramadol oral 50 mg tablet Class: ANALGESICS, NARCOTICS Dose: Frequency / Time: Indication: 1 1 tablet oral at night for severe pain as needed Number of Refills: Number of Units: 0 Thirty (30) Tablet(s) Generic Substitution: Start Date: End Date: Administered at Substitution Permitted [id number] Facility: No Note to Pharmacy: Signature(s): Date(s): SUJEY, SAXER ([id number]) Electronic Signature(s) Signed: 07/20/2016 9:39:47 AM By: Worthy Keeler PA-C Entered By: Worthy Keeler on 07/19/2016 14:54:48 [name] ([id number]) --------------------------------------------------------------------------------  Problem List Details Patient Name: [name], [name] 07/19/2016 10:45 Date of Service: AM Medical Record [id number] Number: Patient Account Number: [id number] 08-18-1919 (82 y.o. 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 Treating RN: [name] Date of Birth/Sex: Female) Other Clinician: Primary Care Physician: [name] Treating [name] III, Wilberth Damon Referring Physician: Lorelee [name] Physician/Extender: [name] in Treatment: 14 Subjective Chief Complaint Information obtained from Patient chronic wounds on the left lateral ankle and left 5th toe that been present for roughly a year History of Present Illness (HPI) 04/06/16; this is a 82 year old woman who arrives accompanied by 2 daughters for a wound on her left ankle and her left fifth toe. These have apparently been present for a year. I'm not quite certain how she came to this clinic however she was being followed by [name] her podiatrist for these wounds. She was also referred to [facility] and they apparently did a test presumably arterial studies although we don't have any of these results and we couldn't get through to the office today. The family but has been applying a combination of Bactroban and a light bandage and perhaps more recently Silvadene cream. She did have an x-ray of the foot roughly 6 months ago at the podiatry office the family was unaware that if there were any abnormalities. Apparently they have not seen any healing here. Our intake nurse noted a slight skin tear on the right anterior lower leg. Nobody seemed aware of this. ABIs calculated in this clinic was 0.3 on the right and 0.4 on the left I have reviewed things [facility]. There is very little information on this patient. She apparently follows in current total clinic which we don't have information from. She has mentioned already been to a [facility]. She has a history of hypothyroidism, nephrolithiasis arthritis and has had a previous mastectomy. 04/13/16; patient's x-ray was normal. She is already been to see Dr.  Delana Meyer vascular surgery. Her arterial exam was from November 2016 this showed a left ABI of 0.62 her right of 0.76. Her duplex ultrasound of the left leg showed biphasic waves in the common femoral and distal femoral artery however monophasic waves in the superficial femoral artery proximal and mid biphasic it distal. 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 The patient tells me that she has pain at night when she tries to lie down which is improved by getting up and sitting in the chair this sounds like claudication at rest. Her wounds are on the left medial malleolus and the dorsal fifth toe small punched out wounds that are right on bone. We use Santyl last week 04/20/16: nurse informed me pt has declined evaluation for significant PAD. she denies systemic s/s of infections. 04/27/16;; the patient has had noninvasive arterial studies done in November 2016. ABI and the left was 0.62.  Monophasic waves at the superficial femoral artery. Occluded to the posterior tibial artery. So had greater than 50% stenosis of the right superficial femoral and greater than 50% stenosis of the left superficial femoral artery. She had bilateral tibial peroneal artery disease. Both of the wounds on the left fifth toe and left lateral malleolus have been present for more than a year. They have been to see vein and vascular. The patient has some pain but miraculously I think the wounds have largely been stable. No [name], [name] ([id number]) evidence of infection 05/04/16; she goes for a noninvasive study tomorrow and then sees Dr. Fletcher Anon on Monday. By the time she is here next week we should have a better picture of whether something can be done with regards to her arterial flow. We continue to have ischemic-looking wounds on the left fifth toe and left lateral malleolus. 05/10/16; the patient went for her arterial studies and saw Dr. Fletcher Anon. As predicted she is felt to have critical limb ischemia. The feeling is  that she has occlusion of the SFA. The feeling would she would be a candidate for a stent to the SFA. The patient did not make a decision to proceed with the procedure and she is here with family members to discuss this me today. He shouldn't has a lot of pain and cannot sleep and rest well at night per her family. 05/24/16; the patient went and had a complex revascularization/angioplasty of the left superficial femoral artery followed by drug-coated balloon angioplasty and spots stenting. She tolerated the procedure well. She was recommended for dual antiplatelet drugs with Plavix and aspirin for at least a month. She went yesterday for I believe follow-up serial Dopplers and ABIs although I don't see these results. The patient is unfortunately complaining of a lot of pain in her bilateral lower legs below the knees from the ankle to the knees. She apparently was prescribed lidocaine and apparently put this on her legs instead of over the wound areas. This may have something to do with it however there is a lot of edema in her bilateral legs I was able to find her arterial studies from yesterday. The left ABI has improved up to 0.66 post left SFA stent. The bilateral great toe indices remain abnormal with the left being in the 0.25 range. Duplex ultrasound showed her left SFA stent is patent monophasic waveforms persist in the left leg 06/07/16; continued punched-out areas over the dorsal left fifth toe and left lateral malleolus. No major improvement 06/28/16; the areas over her dorsal left fifth toe and left lateral malleolus are covered in surface slough we are using Iodoflex 07/05/16. We have been using Iodoflex for 2-3 weeks now. I have not been debridement is because of pain 07/19/16 currently we have been using Iodoflex for roughly the past month. Previously we were unable to debride due to pain although today patient states that the pain is not nearly as severe as it has been in the past.  In fact she rates this to be a 1 out of 10 and it worse to a to 10 with palpation of the wound. All and all her and her daughter feel like this is actually doing steadily better at this point in time. She is pleased with progress and we have been seeing her every 2 weeks. Objective Constitutional Well-nourished and well-hydrated in no acute distress. Vitals Time Taken: 10:53 AM, Height: 64 in, Weight: 158 lbs, BMI: 27.1, Temperature: 97.8 F, Pulse: 79 bpm, Respiratory Rate:  18 breaths/min, Blood Pressure: 150/49 mmHg. General Notes: Veneda Melter, PA aware of BP. Respiratory normal breathing without difficulty. MIRAJANE, SEEPERSAD ([id number]) Cardiovascular 1+ dorsalis pedis/posterior tibialis pulses. trace pitting edema noted of the left lower extremity. Psychiatric this patient is able to make decisions and demonstrates good insight into disease process. Alert and Oriented x 3. pleasant and cooperative. General Notes: Patient's wound actually appears to be somewhat improved at this point in time today. This is excellent news currently we have been using Iodoflex over the wound. Dr. Quentin Cornwall was actually able to debride last week and I substernally was also able to debride the wounds today which appeared to be gaining a better surface especially in regard to the toe although the lateral malleolus still has more work to do to get a sufficient granulation bed going. Integumentary (Hair, Skin) Wound #1 status is Open. Original cause of wound was Gradually Appeared. The wound is located on the Left Malleolus. The wound measures 0.4cm length x 0.3cm width x 0.2cm depth; 0.094cm^2 area and 0.019cm^3 volume. The wound is limited to skin breakdown. There is no tunneling or undermining noted. There is a large amount of serous drainage noted. The wound margin is thickened. There is medium (34- 66%) pink granulation within the wound bed. There is a medium (34-66%) amount of necrotic tissue within the  wound bed including Adherent Slough. The periwound skin appearance exhibited: Localized Edema, Maceration, Moist, Erythema. The surrounding wound skin color is noted with erythema which is circumferential. Periwound temperature was noted as No Abnormality. The periwound has tenderness on palpation. Wound #2 status is Open. Original cause of wound was Gradually Appeared. The wound is located on the Left,Lateral Toe Fifth. The wound measures 0.3cm length x 0.7cm width x 0.3cm depth; 0.165cm^2 area and 0.049cm^3 volume. The wound is limited to skin breakdown. There is no tunneling or undermining noted. There is a large amount of serous drainage noted. The wound margin is thickened. There is no granulation within the wound bed. There is a large (67-100%) amount of necrotic tissue within the wound bed including Adherent Slough. The periwound skin appearance exhibited: Localized Edema, Maceration, Moist, Mottled. The periwound skin appearance did not exhibit: Callus, Crepitus, Excoriation, Fluctuance, Friable, Induration, Rash, Scarring, Dry/Scaly, Atrophie Blanche, Cyanosis, Ecchymosis, Hemosiderin Staining, Pallor, Rubor, Erythema. Periwound temperature was noted as No Abnormality. The periwound has tenderness on palpation. 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 There was a Skin/Subcutaneous Tissue Debridement [id number] debridement with total area of 0.12 sq cm performed by [name] III, Dino Borntreger. with the following instrument(s): Curette to  remove Viable and Non-Viable tissue/material including Exudate, Fibrin/Slough, and Subcutaneous after achieving pain control using Lidocaine 4% Topical Solution. A time out was conducted at 11:22, prior to the start of the procedure. A Minimum amount of bleeding was controlled with Pressure. The procedure was tolerated well with a pain level of 0 throughout and a pain level of 0 following the procedure. Post Debridement Measurements: 0.4cm length x 0.3cm width x 0.3cm depth; 0.028cm^3 volume. Character of Wound/Ulcer Post Debridement requires further debridement. Severity of  Tissue Post Debridement is: Fat layer exposed. 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 There was a Skin/Subcutaneous Tissue Debridement [id number] debridement with total area of 0.21 sq cm performed by [name] III, Donnetta Gillin. with the following instrument(s): Curette to remove Viable and Non-Viable tissue/material including Exudate, Fibrin/Slough, and Subcutaneous after achieving pain control using Lidocaine 4% Topical Solution. A time out was conducted at 11:22, prior to the start of the procedure. A Minimum amount of bleeding was controlled with Pressure. The procedure was tolerated well with a pain level of 0 throughout and a pain level of 0 following the procedure. Post Debridement Measurements: 0.3cm length x 0.7cm width x 0.3cm depth; 0.049cm^3 volume. Character of Wound/Ulcer Post Debridement requires further debridement. Severity of Tissue Post Debridement is: Fat layer exposed. Post procedure Diagnosis Wound #2: Same as Pre-Procedure Plan Wound Cleansing: Wound #1 Left Malleolus: Clean wound with Normal Saline. - for clinic use Cleanse wound with mild soap and water May Shower, gently pat wound dry prior to applying new dressing. Wound #2 Left,Lateral Toe Fifth: Clean wound with Normal Saline. - for  clinic use Cleanse wound with mild soap and water May Shower, gently pat wound dry prior to applying new dressing. Anesthetic: Wound #1 Left Malleolus: [name], Jasmyn ([id number]) Topical Lidocaine 4% cream applied to wound bed prior to debridement - for clinic use Wound #2 Left,Lateral Toe Fifth: Topical Lidocaine 4% cream applied to wound bed prior to debridement - for clinic use Primary Wound Dressing: Wound #1 Left Malleolus: Iodosorb Ointment Wound #2 Left,Lateral Toe Fifth: Iodoflex Secondary Dressing: Wound #1 Left Malleolus: Dry Gauze Boardered Foam Dressing Wound #2 Left,Lateral Toe Fifth: Dry Gauze Boardered Foam Dressing Dressing Change Frequency: Wound #1 Left Malleolus: Change dressing every other day. Wound #2 Left,Lateral Toe Fifth: Change dressing every other day. Follow-up Appointments: Wound #1 Left Malleolus: Return Appointment in 2 weeks. Wound #2 Left,Lateral Toe Fifth: Return Appointment in 2 weeks. Edema Control: Wound #1 Left Malleolus: Elevate legs to the level of the heart and pump ankles as often as possible Other: - compression stockings Wound #2 Left,Lateral Toe Fifth: Elevate legs to the level of the heart and pump ankles as often as possible Other: - compression stockings Additional Orders / Instructions: Wound #1 Left Malleolus: Increase protein intake. Wound #2 Left,Lateral Toe Fifth: Increase protein intake. Medications-please add to medication list.: Wound #1 Left Malleolus: Other: - Vitamin C, Zinc, Multivitamins Wound #2 Left,Lateral Toe Fifth: Other: - Vitamin C, Zinc, Multivitamins Tramadol The following medication(s) was prescribed: tramadol oral 50 mg tablet 1 1 tablet oral at night for severe pain as needed starting 07/19/2016 Follow-Up Appointments: A follow-up appointment should be scheduled. MAURICE, HALEY ([id number]) Medication Reconciliation completed and provided to Patient/Care Provider. A Patient Clinical  Summary of Care was provided to [facility] At this point in time patient continues to utilize her compression stockings. I'm going to change her to Iodosorb ointment for both wounds at this point in time today. She seems to be cleaning up nicely in this regard. We will cover this with a border foam dressing and then she can continue to use her compression hose. hopefully we will continue to see improvement. We will see her for reevaluation in 2 weeks to see where things stand at that point in time. If she has any concerns in the interim she can contact the office and let us know. this was discussed with  both patient and her daughter and all questions and concerns were answered to the best of my ability during the visit today. Electronic Signature(s) Signed: 07/20/2016 9:39:47 AM By: Worthy Keeler PA-C Entered By: Worthy Keeler on 07/19/2016 14:53:32 [name] ([id number]) -------------------------------------------------------------------------------- SuperBill Details Patient Name: [name] Date of Service: 07/19/2016 Medical Record Number: [id number] Patient Account Number: [id number] Date of Birth/Sex: 18-Jul-1919 (82 y.o. 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 right calf limited to breakdown of skin Facility Procedures CPT4: Description Modifier Quantity Code [id number] - DEB SUBQ TISSUE 20 SQ CM/< 1 ICD-10 Description Diagnosis L97.523 Non-pressure chronic ulcer of  other part of left foot with necrosis of muscle L97.211 Non-pressure chronic ulcer of right calf  limited to breakdown of skin I70.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 I70.243 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 Physician Procedures CPT4: Description Modifier Quantity Code [id number] - WC PHYS SUBQ TISS 20 SQ CM 1 ICD-10 Description Diagnosis L97.523 Non-pressure chronic ulcer of other part of left foot with necrosis of muscle L97.211 Non-pressure chronic ulcer of right calf  limited to breakdown of skin I70.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

## 2016-08-02 ENCOUNTER — Encounter: Payer: Medicare PPO | Admitting: Internal Medicine

## 2016-08-02 DIAGNOSIS — I70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: Secondary | ICD-10-CM | POA: Diagnosis not present

## 2016-08-04 NOTE — Progress Notes (Signed)
BLAKENEY, SUITT ([id number]) Visit Report for 08/02/2016 Arrival Information Details Patient Name: [name], [name] Date of Service: 08/02/2016 10:45 AM Medical Record Patient Account Number: [id number] Number: Treating RN: Ahmed Prima Jul 14, 1919 (82 y.o. Other Clinician: Date of Birth/Sex: Female) Treating ROBSON, [place] Primary Care Physician: Lorelee Market Physician/Extender: G Referring Physician: Vinson Moselle in Treatment: 64 Visit Information History Since Last Visit All ordered tests and consults were completed: No Patient Arrived: Gilford Rile Added or deleted any medications: No Arrival Time: 10:46 Any new allergies or adverse reactions: No Accompanied By: daughter Had a fall or experienced change in No Transfer Assistance: EasyPivot Patient activities of daily living that may affect Lift risk of falls: Patient Identification Verified: Yes Signs or symptoms of abuse/neglect since last No Secondary Verification Process Yes visito Completed: Hospitalized since last visit: No Patient Requires Transmission- No Pain Present Now: No Based Precautions: Patient Has Alerts: Yes Patient Alerts: 7/31 ABI: R-0.55 L-0.40 [facility] Electronic Signature(s) Signed: 08/03/2016 5:37:51 PM By: Alric Quan Entered By: Alric Quan on 08/02/2016 10:47:05 [name] ([id number]) -------------------------------------------------------------------------------- Clinic Level of Care Assessment Details Patient Name: [name] Date of Service: 08/02/2016 10:45 AM Medical Record Patient Account Number: [id number] Number: Treating RN: Ahmed Prima 1919/09/20 (82 y.o. Other Clinician: Date of Birth/Sex: Female) Treating ROBSON, [place] Primary Care Physician: Lorelee Market Physician/Extender: G Referring Physician: Vinson Moselle in Treatment: 16 Clinic Level of Care Assessment Items TOOL 4  Quantity Score X - Use when only an EandM is performed on FOLLOW-UP visit 1 0 ASSESSMENTS - Nursing Assessment / Reassessment X - Reassessment of Co-morbidities (includes updates in patient status) 1 10 X - Reassessment of Adherence to Treatment Plan 1 5 ASSESSMENTS - Wound and Skin Assessment / Reassessment []  - Simple Wound Assessment / Reassessment - one wound 0 X - Complex Wound Assessment / Reassessment - multiple wounds 3 5 []  - Dermatologic / Skin Assessment (not related to wound area) 0 ASSESSMENTS - Focused Assessment []  - Circumferential Edema Measurements - multi extremities 0 []  - Nutritional Assessment / Counseling / Intervention 0 []  - Lower Extremity Assessment (monofilament, tuning fork, pulses) 0 []  - Peripheral Arterial Disease Assessment (using hand held doppler) 0 ASSESSMENTS - Ostomy and/or Continence Assessment and Care []  - Incontinence Assessment and Management 0 []  - Ostomy Care Assessment and Management (repouching, etc.) 0 PROCESS - Coordination of Care X - Simple Patient / Family Education for ongoing care 1 15 []  - Complex (extensive) Patient / Family Education for ongoing care 0 X - Staff obtains Programmer, systems, Records, Test Results / Process Orders 1 10 []  - Staff telephones HHA, Nursing Homes / Clarify orders / etc 0 Raybuck, Jackilyn ([id number]) []  - Routine Transfer to another Facility (non-emergent condition) 0 []  - Routine Hospital Admission (non-emergent condition) 0 []  - New Admissions / Biomedical engineer / Ordering NPWT, Apligraf, etc. 0 []  - Emergency Hospital Admission (emergent condition) 0 X - Simple Discharge Coordination 1 10 []  - Complex (extensive) Discharge Coordination 0 PROCESS - Special Needs []  - Pediatric / Minor Patient Management 0 []  - Isolation Patient Management 0 []  - Hearing / Language / Visual special needs 0 []  - Assessment of Community assistance (transportation, D/C planning, etc.) 0 []  - Additional assistance /  Altered mentation 0 []  - Support Surface(s) Assessment (bed, cushion, seat, etc.) 0 INTERVENTIONS - Wound Cleansing / Measurement []  - Simple Wound Cleansing - one wound 0 X - Complex Wound Cleansing - multiple wounds 3 5 X - Wound Imaging (  photographs - any number of wounds) 1 5 []  - Wound Tracing (instead of photographs) 0 []  - Simple Wound Measurement - one wound 0 X - Complex Wound Measurement - multiple wounds 3 5 INTERVENTIONS - Wound Dressings X - Small Wound Dressing one or multiple wounds 3 10 []  - Medium Wound Dressing one or multiple wounds 0 []  - Large Wound Dressing one or multiple wounds 0 X - Application of Medications - topical 1 5 []  - Application of Medications - injection 0 Fung, Arilla ([id number]) INTERVENTIONS - Miscellaneous []  - External ear exam 0 []  - Specimen Collection (cultures, biopsies, blood, body fluids, etc.) 0 []  - Specimen(s) / Culture(s) sent or taken to Lab for analysis 0 []  - Patient Transfer (multiple staff / Harrel Lemon Lift / Similar devices) 0 []  - Simple Staple / Suture removal (25 or less) 0 []  - Complex Staple / Suture removal (26 or more) 0 []  - Hypo / Hyperglycemic Management (close monitor of Blood Glucose) 0 []  - Ankle / Brachial Index (ABI) - do not check if billed separately 0 X - Vital Signs 1 5 Has the patient been seen at the hospital within the last three years: Yes Total Score: 140 Level Of Care: New/Established - Level 4 Electronic Signature(s) Signed: 08/03/2016 5:37:51 PM By: Alric Quan Entered By: Alric Quan on 08/02/2016 15:57:04 [name] ([id number]) -------------------------------------------------------------------------------- Encounter Discharge Information Details Patient Name: [name] Date of Service: 08/02/2016 10:45 AM Medical Record Patient Account Number: [id number] Number: Treating RN: Carolyne Fiscal, Debi 11/16/1918 (82 y.o. Other Clinician: Date of Birth/Sex: Female)  Treating ROBSON, [place] Primary Care Physician: Lorelee Market Physician/Extender: G Referring Physician: Vinson Moselle in Treatment: 16 Encounter Discharge Information Items Discharge Pain Level: 0 Discharge Condition: Stable Ambulatory Status: Walker Discharge Destination: Home Transportation: Private Auto Accompanied By: daughter Schedule Follow-up Appointment: Yes Medication Reconciliation completed and provided to Patient/Care Yes Chastin Garlitz: Provided on Clinical Summary of Care: 08/02/2016 Form Type Recipient Paper Patient [facility] Electronic Signature(s) Signed: 08/02/2016 11:32:29 AM By: Ruthine Dose Entered By: Ruthine Dose on 08/02/2016 11:32:28 [name] ([id number]) -------------------------------------------------------------------------------- Lower Extremity Assessment Details Patient Name: [name] Date of Service: 08/02/2016 10:45 AM Medical Record Patient Account Number: [id number] Number: Treating RN: Ahmed Prima 02-Apr-1919 (82 y.o. Other Clinician: Date of Birth/Sex: Female) Treating ROBSON, [place] Primary Care Physician: Lorelee Market Physician/Extender: G Referring Physician: Lorelee Market Weeks in Treatment: 16 Vascular Assessment Pulses: Posterior Tibial Dorsalis Pedis Palpable: [Left:Yes] [Right:Yes] Extremity colors, hair growth, and conditions: Temperature of Extremity: [Left:Warm] [Right:Warm] Capillary Refill: [Left:< 3 seconds] [Right:< 3 seconds] Toe Nail Assessment Left: Right: Thick: No No Discolored: Yes Yes Deformed: No No Improper Length and Hygiene: No No Electronic Signature(s) Signed: 08/03/2016 5:37:51 PM By: Alric Quan Entered By: Alric Quan on 08/02/2016 10:53:26 [name] ([id number]) -------------------------------------------------------------------------------- Multi Wound Chart Details Patient Name: [name] Date of Service: 08/02/2016  10:45 AM Medical Record Patient Account Number: [id number] Number: Treating RN: Ahmed Prima Aug 07, 1919 (82 y.o. Other Clinician: Date of Birth/Sex: Female) Treating ROBSON, [place] Primary Care Physician: Lorelee Market Physician/Extender: G Referring Physician: Lorelee Market Weeks in Treatment: 16 Vital Signs Height(in): 64 Pulse(bpm): 66 Weight(lbs): 158 Blood Pressure 121/91 (mmHg): Body Mass Index(BMI): 27 Temperature(F): 97.8 Respiratory Rate 18 (breaths/min): Photos: [1:No Photos] [2:No Photos] [4:No Photos] Wound Location: [1:Left Malleolus] [2:Left Toe Fifth - Lateral] [4:Foot - Lateral] Wounding Event: [1:Gradually Appeared] [2:Gradually Appeared] [4:Gradually Appeared] Primary Etiology: [1:Arterial Insufficiency Ulcer Arterial Insufficiency Ulcer Arterial Insufficiency Ulcer] Comorbid History: [1:Cataracts, Hypertension, Cataracts, Hypertension,  Cataracts, Hypertension, Osteoarthritis] [2:Osteoarthritis] [4:Osteoarthritis] Date Acquired: [1:04/07/2015] [2:04/07/2015] [4:07/26/2016] Weeks of Treatment: [1:16] [2:16] [4:0] Wound Status: [1:Open] [2:Open] [4:Open] Measurements L x W x D 0.2x0.2x0.2 [2:0.4x0.3x0.2] [4:0.3x0.2x0.1] (cm) Area (cm) : [1:0.031] [2:0.094] [4:0.047] Volume (cm) : [1:0.006] [2:0.019] [4:0.005] % Reduction in Area: [1:91.90%] [2:-100.00%] [4:N/A] % Reduction in Volume: 92.20% [2:-111.10%] [4:N/A] Classification: [1:Partial Thickness] [2:Partial Thickness] [4:Partial Thickness] Exudate Amount: [1:Large] [2:Large] [4:Large] Exudate Type: [1:Serous] [2:Serous] [4:Serous] Exudate Color: [1:amber] [2:amber] [4:amber] Wound Margin: [1:Thickened] [2:Thickened] [4:Distinct, outline attached] Granulation Amount: [1:Medium (34-66%)] [2:None Present (0%)] [4:None Present (0%)] Granulation Quality: [1:Pink] [2:N/A] [4:N/A] Necrotic Amount: [1:Medium (34-66%)] [2:Large (67-100%)] [4:Large (67-100%)] Exposed  Structures: [1:Fascia: No Fat: No Tendon: No Muscle: No Joint: No] [2:Fascia: No Fat: No Tendon: No Muscle: No Joint: No] [4:Fascia: No Fat: No Tendon: No Muscle: No Joint: No] Bone: No Bone: No Bone: No Limited to Skin Limited to Skin Limited to Skin Breakdown Breakdown Breakdown Epithelialization: None None None Periwound Skin Texture: Edema: Yes Edema: Yes No Abnormalities Noted Excoriation: No Induration: No Callus: No Crepitus: No Fluctuance: No Friable: No Rash: No Scarring: No Periwound Skin Maceration: Yes Maceration: Yes Moist: Yes Moisture: Moist: Yes Moist: Yes Dry/Scaly: No Periwound Skin Color: Erythema: Yes Mottled: Yes No Abnormalities Noted Atrophie Blanche: No Cyanosis: No Ecchymosis: No Erythema: No Hemosiderin Staining: No Pallor: No Rubor: No Erythema Location: Circumferential N/A N/A Temperature: No Abnormality No Abnormality No Abnormality Tenderness on Yes Yes Yes Palpation: Wound Preparation: Ulcer Cleansing: Ulcer Cleansing: Ulcer Cleansing: Rinsed/Irrigated with Rinsed/Irrigated with Rinsed/Irrigated with Saline Saline Saline Topical Anesthetic Topical Anesthetic Topical Anesthetic Applied: Other: lidocaine Applied: Other: lidocaine Applied: Other: lidocaine 4% 4% 4% Treatment Notes Electronic Signature(s) Signed: 08/03/2016 5:37:51 PM By: Alric Quan Entered By: Alric Quan on 08/02/2016 11:03:27 [name] ([id number]) -------------------------------------------------------------------------------- Multi-Disciplinary Care Plan Details Patient Name: [name] Date of Service: 08/02/2016 10:45 AM Medical Record Patient Account Number: [id number] Number: Treating RN: Ahmed Prima April 17, 1919 (82 y.o. Other Clinician: Date of Birth/Sex: Female) Treating ROBSON, [place] Primary Care Physician: Lorelee Market Physician/Extender: G Referring Physician: Vinson Moselle in Treatment:  16 Active Inactive Abuse / Safety / Falls / Self Care Management Nursing Diagnoses: Potential for falls Goals: Patient will remain injury free Date Initiated: 04/06/2016 Goal Status: Active Interventions: Assess fall risk on admission and as needed Notes: Nutrition Nursing Diagnoses: Imbalanced nutrition Goals: Patient/caregiver agrees to and verbalizes understanding of need to use nutritional supplements and/or vitamins as prescribed Date Initiated: 04/06/2016 Goal Status: Active Interventions: Assess patient nutrition upon admission and as needed per policy Notes: Orientation to the Wound Care Program Nursing Diagnoses: Knowledge deficit related to the wound healing center program RYNLEIGH, STAAL ([id number]) Goals: Patient/caregiver will verbalize understanding of the [place] Date Initiated: 04/06/2016 Goal Status: Active Interventions: Provide education on orientation to the wound center Notes: Pain, Acute or Chronic Nursing Diagnoses: Pain, acute or chronic: actual or potential Potential alteration in comfort, pain Goals: Patient will verbalize adequate pain control and receive pain control interventions during procedures as needed Date Initiated: 04/06/2016 Goal Status: Active Interventions: Assess comfort goal upon admission Complete pain assessment as per visit requirements Notes: Wound/Skin Impairment Nursing Diagnoses: Impaired tissue integrity Goals: Ulcer/skin breakdown will have a volume reduction of 30% by week 4 Date Initiated: 04/06/2016 Goal Status: Active Ulcer/skin breakdown will have a volume reduction of 50% by week 8 Date Initiated: 04/06/2016 Goal Status: Active Ulcer/skin breakdown will have a volume reduction of 80% by week 12 Date Initiated: 04/06/2016 Goal Status:  Active Interventions: Assess patient/caregiver ability to obtain necessary supplies Avetisyan, Shantae ([id number]) Assess ulceration(s) every  visit Notes: Electronic Signature(s) Signed: 08/03/2016 5:37:51 PM By: Alric Quan Entered By: Alric Quan on 08/02/2016 11:03:17 [name] ([id number]) -------------------------------------------------------------------------------- Pain Assessment Details Patient Name: [name] Date of Service: 08/02/2016 10:45 AM Medical Record Patient Account Number: [id number] Number: Treating RN: Ahmed Prima 05-27-1919 (82 y.o. Other Clinician: Date of Birth/Sex: Female) Treating ROBSON, [place] Primary Care Physician: Lorelee Market Physician/Extender: G Referring Physician: Lorelee Market Weeks in Treatment: 16 Active Problems Location of Pain Severity and Description of Pain Patient Has Paino No Site Locations With Dressing Change: No Pain Management and Medication Current Pain Management: Electronic Signature(s) Signed: 08/03/2016 5:37:51 PM By: Alric Quan Entered By: Alric Quan on 08/02/2016 10:47:11 [name] ([id number]) -------------------------------------------------------------------------------- Patient/Caregiver Education Details Patient Name: [name] Date of Service: 08/02/2016 10:45 AM Medical Record Patient Account Number: [id number] Number: Treating RN: Carolyne Fiscal, Debi Sep 17, 1919 (82 y.o. Other Clinician: Date of Birth/Gender: Female) Treating ROBSON, [place] Primary Care Physician: Lorelee Market Physician/Extender: G Referring Physician: Vinson Moselle in Treatment: 16 Education Assessment Education Provided To: Patient Education Topics Provided Wound/Skin Impairment: Handouts: Other: change dressing as ordered Methods: Demonstration, Explain/Verbal Responses: State content correctly Electronic Signature(s) Signed: 08/03/2016 5:37:51 PM By: Alric Quan Entered By: Alric Quan on 08/02/2016 11:13:42 [name]  ([id number]) -------------------------------------------------------------------------------- Wound Assessment Details Patient Name: [name] Date of Service: 08/02/2016 10:45 AM Medical Record Patient Account Number: [id number] Number: Treating RN: Ahmed Prima Jul 02, 1919 (82 y.o. Other Clinician: Date of Birth/Sex: Female) Treating ROBSON, [place] Primary Care Physician: Lorelee Market Physician/Extender: G Referring Physician: Lorelee Market Weeks in Treatment: 16 Wound Status Wound Number: 1 Primary Arterial Insufficiency Ulcer Etiology: Wound Location: Left Malleolus Wound Status: Open Wounding Event: Gradually Appeared Comorbid Cataracts, Hypertension, Date Acquired: 04/07/2015 History: Osteoarthritis Weeks Of Treatment: 16 Clustered Wound: No Photos Photo Uploaded By: Alric Quan on 08/02/2016 11:36:58 Wound Measurements Length: (cm) 0.2 % Reduction in Width: (cm) 0.2 % Reduction in Depth: (cm) 0.2 Epithelializati Area: (cm) 0.031 Tunneling: Volume: (cm) 0.006 Undermining: Area: 91.9% Volume: 92.2% on: None No No Wound Description Classification: Partial Thickness Foul Odor Afte Wound Margin: Thickened Exudate Amount: Large Exudate Type: Serous Exudate Color: amber r Cleansing: No Wound Bed Granulation Amount: Medium (34-66%) Exposed Structure Granulation Quality: Pink Fascia Exposed: No Necrotic Amount: Medium (34-66%) Fat Layer Exposed: No Levels, Naz[place] ([id number]) Necrotic Quality: Adherent Slough Tendon Exposed: No Muscle Exposed: No Joint Exposed: No Bone Exposed: No Limited to Skin Breakdown Periwound Skin Texture Texture Color No Abnormalities Noted: No No Abnormalities Noted: No Localized Edema: Yes Erythema: Yes Erythema Location: Circumferential Moisture No Abnormalities Noted: No Temperature / Pain Maceration: Yes Temperature: No Abnormality Moist: Yes Tenderness on Palpation: Yes Wound  Preparation Ulcer Cleansing: Rinsed/Irrigated with Saline Topical Anesthetic Applied: Other: lidocaine 4%, Treatment Notes Wound #1 (Left Malleolus) 1. Cleansed with: Clean wound with Normal Saline 2. Anesthetic Topical Lidocaine 4% cream to wound bed prior to debridement 3. Peri-wound Care: Skin Prep 4. Dressing Applied: Iodosorb Ointment 5. Secondary Dressing Applied Bordered Foam Dressing Dry Gauze Electronic Signature(s) Signed: 08/03/2016 5:37:51 PM By: Alric Quan Entered By: Alric Quan on 08/02/2016 10:57:38 [name] ([id number]) -------------------------------------------------------------------------------- Wound Assessment Details Patient Name: [name] Date of Service: 08/02/2016 10:45 AM Medical Record Patient Account Number: [id number] Number: Treating RN: Ahmed Prima 1918-12-18 (82 y.o. Other Clinician: Date of Birth/Sex: Female) Treating ROBSON, [place] Primary Care Physician: Lorelee Market Physician/Extender: G Referring Physician: Lorelee Market  Weeks in Treatment: 16 Wound Status Wound Number: 2 Primary Arterial Insufficiency Ulcer Etiology: Wound Location: Left Toe Fifth - Lateral Wound Status: Open Wounding Event: Gradually Appeared Comorbid Cataracts, Hypertension, Date Acquired: 04/07/2015 History: Osteoarthritis Weeks Of Treatment: 16 Clustered Wound: No Wound Measurements Length: (cm) 0.4 Width: (cm) 0.3 Depth: (cm) 0.2 Area: (cm) 0.094 Volume: (cm) 0.019 % Reduction in Area: -100% % Reduction in Volume: -111.1% Epithelialization: None Tunneling: No Undermining: No Wound Description Classification: Partial Thickness Foul Odor Af Wound Margin: Thickened Exudate Amount: Large Exudate Type: Serous Exudate Color: amber ter Cleansing: No Wound Bed Granulation Amount: None Present (0%) Exposed Structure Necrotic Amount: Large (67-100%) Fascia Exposed: No Necrotic Quality: Adherent  Slough Fat Layer Exposed: No Tendon Exposed: No Muscle Exposed: No Joint Exposed: No Bone Exposed: No Limited to Skin Breakdown Periwound Skin Texture Texture Color No Abnormalities Noted: No No Abnormalities Noted: No Callus: No Atrophie Blanche: No Crepitus: No Cyanosis: No Ramnath, Lauranne ([id number]) Excoriation: No Ecchymosis: No Fluctuance: No Erythema: No Friable: No Hemosiderin Staining: No Induration: No Mottled: Yes Localized Edema: Yes Pallor: No Rash: No Rubor: No Scarring: No Temperature / Pain Moisture Temperature: No Abnormality No Abnormalities Noted: No Tenderness on Palpation: Yes Dry / Scaly: No Maceration: Yes Moist: Yes Wound Preparation Ulcer Cleansing: Rinsed/Irrigated with Saline Topical Anesthetic Applied: Other: lidocaine 4%, Treatment Notes Wound #2 (Left, Lateral Toe Fifth) 1. Cleansed with: Clean wound with Normal Saline 2. Anesthetic Topical Lidocaine 4% cream to wound bed prior to debridement 3. Peri-wound Care: Skin Prep 4. Dressing Applied: Iodosorb Ointment 5. Secondary Dressing Applied Bordered Foam Dressing Dry Gauze Electronic Signature(s) Signed: 08/03/2016 5:37:51 PM By: Alric Quan Entered By: Alric Quan on 08/02/2016 10:58:24 [name] ([id number]) -------------------------------------------------------------------------------- Wound Assessment Details Patient Name: [name] Date of Service: 08/02/2016 10:45 AM Medical Record Patient Account Number: [id number] Number: Treating RN: Ahmed Prima 30-Apr-1919 (82 y.o. Other Clinician: Date of Birth/Sex: Female) Treating ROBSON, [place] Primary Care Physician: Lorelee Market Physician/Extender: G Referring Physician: Lorelee Market Weeks in Treatment: 16 Wound Status Wound Number: 4 Primary Arterial Insufficiency Ulcer Etiology: Wound Location: Foot - Lateral Wound Status: Open Wounding Event: Gradually  Appeared Comorbid Cataracts, Hypertension, Date Acquired: 07/26/2016 History: Osteoarthritis Weeks Of Treatment: 0 Clustered Wound: No Photos Photo Uploaded By: Alric Quan on 08/02/2016 11:36:59 Wound Measurements Length: (cm) 0.3 Width: (cm) 0.2 Depth: (cm) 0.1 Area: (cm) 0.047 Volume: (cm) 0.005 % Reduction in Area: % Reduction in Volume: Epithelialization: None Tunneling: No Undermining: No Wound Description Classification: Partial Thickness Wound Margin: Distinct, outline attached Exudate Amount: Large Exudate Type: Serous Exudate Color: amber Foul Odor After Cleansing: No Wound Bed Granulation Amount: None Present (0%) Exposed Structure Necrotic Amount: Large (67-100%) Fascia Exposed: No Necrotic Quality: Adherent Slough Fat Layer Exposed: No Giller, [place] ([id number]) Tendon Exposed: No Muscle Exposed: No Joint Exposed: No Bone Exposed: No Limited to Skin Breakdown Periwound Skin Texture Texture Color No Abnormalities Noted: No No Abnormalities Noted: No Moisture Temperature / Pain No Abnormalities Noted: No Temperature: No Abnormality Moist: Yes Tenderness on Palpation: Yes Wound Preparation Ulcer Cleansing: Rinsed/Irrigated with Saline Topical Anesthetic Applied: Other: lidocaine 4%, Treatment Notes Wound #4 (Lateral Foot) 1. Cleansed with: Clean wound with Normal Saline 2. Anesthetic Topical Lidocaine 4% cream to wound bed prior to debridement 3. Peri-wound Care: Skin Prep 4. Dressing Applied: Iodosorb Ointment 5. Secondary Dressing Applied Bordered Foam Dressing Dry Gauze Electronic Signature(s) Signed: 08/03/2016 5:37:51 PM By: Alric Quan Entered By: Alric Quan on 08/02/2016 11:01:27 [name] ([id number]) --------------------------------------------------------------------------------  Vitals Details Patient Name: DEYLANI, PREDDY Date of Service: 08/02/2016 10:45 AM Medical Record Patient Account Number:  [id number] Number: Treating RN: Ahmed Prima 1919-02-08 (82 y.o. Other Clinician: Date of Birth/Sex: Female) Treating ROBSON, [place] Primary Care Physician: Lorelee Market Physician/Extender: G Referring Physician: Lorelee Market Weeks in Treatment: 16 Vital Signs Time Taken: 10:47 Temperature (F): 97.8 Height (in): 64 Pulse (bpm): 66 Weight (lbs): 158 Respiratory Rate (breaths/min): 18 Body Mass Index (BMI): 27.1 Blood Pressure (mmHg): 121/91 Reference Range: 80 - 120 mg / dl Electronic Signature(s) Signed: 08/03/2016 5:37:51 PM By: Alric Quan Entered By: Alric Quan on 08/02/2016 10:49:57

## 2016-08-04 NOTE — Progress Notes (Signed)
[name], [name] ([id number]) Visit Report for 08/02/2016 Chief Complaint Document Details Patient Name: [name] Date of Service: 08/02/2016 10:45 AM Medical Record Patient Account Number: [id number] Number: Treating RN: [name] 11-12-1918 (82 y.o. Other Clinician: Date of Birth/Sex: Female) Treating [name] Primary Care Physician/Extender: [name] Physician: Referring Physician: Vinson [name] in Treatment: 16 Information Obtained from: Patient Chief Complaint chronic wounds on the left lateral ankle and left 5th toe that been present for roughly a year Electronic Signature(s) Signed: 08/02/2016 5:12:34 PM By: [name] Ham MD Entered By: [name] on 08/02/2016 11:57:49 [name] ([id number]) -------------------------------------------------------------------------------- HPI Details Patient Name: [name] Date of Service: 08/02/2016 10:45 AM Medical Record Patient Account Number: [id number] Number: Treating RN: [name] 07/28/1919 (82 y.o. Other Clinician: Date of Birth/Sex: Female) Treating [name] Primary Care Physician/Extender: [name] Physician: Referring Physician: Lorelee Market [name] in Treatment: 16 History of Present Illness HPI Description: 04/06/16; this is a 82 year old woman who arrives accompanied by 2 daughters for a wound on her left ankle and her left fifth toe. These have apparently been present for a year. I'm not quite certain how she came to this clinic however she was being followed by [name] her podiatrist for these wounds. She was also referred to [facility] and they apparently did a test presumably arterial studies although we don't have any of these results and we couldn't get through to the office today. The family but has been applying a combination of Bactroban and a light bandage and perhaps  more recently Silvadene cream. She did have an x-ray of the foot roughly 6 months ago at the podiatry office the family was unaware that if there were any abnormalities. Apparently they have not seen any healing here. Our intake nurse noted a slight skin tear on the right anterior lower leg. Nobody seemed aware of this. ABIs calculated in this clinic was 0.3 on the right and 0.4 on the left I have reviewed things [facility]. There is very little information on this patient. She apparently follows in current total clinic which we don't have information from. She has mentioned already been to a [facility]. She has a history of hypothyroidism, nephrolithiasis arthritis and has had a previous mastectomy. 04/13/16; patient's x-ray was normal. She is already been to see [name] vascular surgery. Her arterial exam was from November 2016 this showed a left ABI of 0.62 her right of 0.76. Her duplex ultrasound of the left leg showed biphasic waves in the common femoral and distal femoral artery however monophasic waves in the superficial femoral artery proximal and mid biphasic it distal. 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 The patient tells me that she has pain at night when she tries to lie down which is improved by getting up and sitting in the chair this sounds like claudication at rest. Her wounds are on the left medial malleolus and the dorsal fifth toe small punched out wounds that are right on bone. We use Santyl last week 04/20/16: nurse informed me pt has declined evaluation for significant PAD. she denies systemic s/s of infections. 04/27/16;; the patient has had noninvasive arterial studies done in November 2016. ABI and the left was 0.62. Monophasic waves at the superficial femoral artery. Occluded to the posterior tibial artery. So had greater than 50% stenosis of the right superficial femoral and greater than 50% stenosis of the left superficial femoral artery. She had bilateral  tibial peroneal artery disease.  Both of the wounds on the left fifth toe and left lateral malleolus have been present for more than a year. They have been to see vein and vascular. The patient has some pain but miraculously I think the wounds have largely been stable. No evidence of infection 05/04/16; she goes for a noninvasive study tomorrow and then sees [name] on Monday. By the time she is here next week we should have a better picture of whether something can be done with regards to her arterial flow. We continue to have ischemic-looking wounds on the left fifth toe and left lateral malleolus. 05/10/16; the patient went for her arterial studies and saw [name]. As predicted she is felt to have critical [name], [name] ([id number]) limb ischemia. The feeling is that she has occlusion of the SFA. The feeling would she would be a candidate for a stent to the SFA. The patient did not make a decision to proceed with the procedure and she is here with family members to discuss this me today. He shouldn't has a lot of pain and cannot sleep and rest well at night per her family. 05/24/16; the patient went and had a complex revascularization/angioplasty of the left superficial femoral artery followed by drug-coated balloon angioplasty and spots stenting. She tolerated the procedure well. She was recommended for dual antiplatelet drugs with Plavix and aspirin for at least a month. She went yesterday for I believe follow-up serial Dopplers and ABIs although I don't see these results. The patient is unfortunately complaining of a lot of pain in her bilateral lower legs below the knees from the ankle to the knees. She apparently was prescribed lidocaine and apparently put this on her legs instead of over the wound areas. This may have something to do with it however there is a lot of edema in her bilateral legs I was able to find her arterial studies from yesterday. The left ABI has improved up to 0.66  post left SFA stent. The bilateral great toe indices remain abnormal with the left being in the 0.25 range. Duplex ultrasound showed her left SFA stent is patent monophasic waveforms persist in the left leg 06/07/16; continued punched-out areas over the dorsal left fifth toe and left lateral malleolus. No major improvement 06/28/16; the areas over her dorsal left fifth toe and left lateral malleolus are covered in surface slough we are using Iodoflex 07/05/16. We have been using Iodoflex for 2-3 [name] now. I have not been debridement is because of pain 07/19/16 currently we have been using Iodoflex for roughly the past month. Previously we were unable to debride due to pain although today patient states that the pain is not nearly as severe as it has been in the past. In fact she rates this to be a 1 out of 10 and it worse to a to 10 with palpation of the wound. All and all her and her daughter feel like this is actually doing steadily better at this point in time. She is pleased with progress and we have been seeing her every 2 [name]. 08/02/16; this is a delightful 82 year old woman I have not seen in over a month. She has arterial insufficiency wounds remaining over the left lateral malleolus and the left fifth toe. With the help of [name] we are able to get her revascularized on the left. The 2 wounds on the left have been making good progress and are definitely smaller especially the area over the left lateral malleolus. She is certainly  in a lot less pain than she used to be although I still think there is some claudication type pain. Unfortunately she is developed a area on the right lateral foot which is a small open area but I think is threatened. I suspect a small ischemic areas well. Also on her right fifth toe there is an area that is not open but looks as though it is receiving too much pressure from footwear. Finally an area over her right malleolus although I don't think this is on  its way to anything ominous Electronic Signature(s) Signed: 08/02/2016 5:12:34 PM By: [name] Ham MD Entered By: [name] on 08/02/2016 12:05:19 [name] ([id number]) -------------------------------------------------------------------------------- Physical Exam Details Patient Name: [name] Date of Service: 08/02/2016 10:45 AM Medical Record Patient Account Number: [id number] Number: Treating RN: [name] 1919/01/06 (82 y.o. Other Clinician: Date of Birth/Sex: Female) Treating [name] Primary Care Physician/Extender: [name] Memo, [place] Physician: Referring Physician: Lorelee Market [name] in Treatment: 16 Constitutional Sitting or standing Blood Pressure is within target range for patient.Marland Kitchen Heart rate irregular.Marland Kitchen Respiratory Respiratory effort is easy and symmetric bilaterally. Rate is normal at rest and on room air.Marland Kitchen Lymphatic Nonpalpable the popliteal or inguinal area. Psychiatric No evidence of depression, anxiety, or agitation. Calm, cooperative, and communicative. Appropriate interactions and affect.. Notes Wound exam; the area on her left lateral malleolus is now down to a very small linear wound the area on her left fifth toe was also small but with some depth. She has a new open area on the right mid foot there is a surface on this although I am quite convinced this is quite open. There is concerning areas on the right fifth toe and to a lesser extent the right lateral malleolus that are going to need follow-up over time. These are not specific open wounds however I have advised careful follow-up of the right fifth toe and careful offloading Electronic Signature(s) Signed: 08/02/2016 5:12:34 PM By: [name] Ham MD Entered By: [name] on 08/02/2016 12:52:41 [name] ([id number]) -------------------------------------------------------------------------------- Physician Orders Details Patient  Name: [name] Date of Service: 08/02/2016 10:45 AM Medical Record Patient Account Number: [id number] Number: Treating RN: [name] [birth year] (82 y.o. Other Clinician: Date of Birth/Sex: Female) Treating [name] Primary Care Physician/Extender: [name] Physician: Referring Physician: Vinson [name] in Treatment: 16 Verbal / Phone Orders: Yes Clinician: Carolyne Fiscal, [name] Read Back and Verified: Yes Diagnosis Coding Wound Cleansing Wound #1 Left Malleolus o Clean wound with Normal Saline. - for clinic use o Cleanse wound with mild soap and water o May Shower, gently pat wound dry prior to applying new dressing. Wound #4 Lateral Foot o Clean wound with Normal Saline. - for clinic use o Cleanse wound with mild soap and water o May Shower, gently pat wound dry prior to applying new dressing. Wound #2 Left,Lateral Toe Fifth o Clean wound with Normal Saline. - for clinic use o Cleanse wound with mild soap and water o May Shower, gently pat wound dry prior to applying new dressing. Anesthetic Wound #1 Left Malleolus o Topical Lidocaine 4% cream applied to wound bed prior to debridement - for clinic use Wound #4 Lateral Foot o Topical Lidocaine 4% cream applied to wound bed prior to debridement - for clinic use Wound #2 Left,Lateral Toe Fifth o Topical Lidocaine 4% cream applied to wound bed prior to debridement - for clinic use Skin Barriers/Peri-Wound Care o Skin Prep Primary Wound Dressing Wound #1 Left Malleolus o Iodosorb Ointment  Wound #4 Lateral Foot o Iodosorb Ointment [name], [name] ([id number]) Wound #2 Left,Lateral Toe Fifth o Iodosorb Ointment Secondary Dressing Wound #1 Left Malleolus o Dry Gauze o Boardered Foam Dressing Wound #2 Left,Lateral Toe Fifth o Dry Gauze o Boardered Foam Dressing Dressing Change Frequency Wound #1 Left Malleolus o Change dressing  every other day. Wound #4 Lateral Foot o Change dressing every other day. Wound #2 Left,Lateral Toe Fifth o Change dressing every other day. Follow-up Appointments Wound #1 Left Malleolus o Return Appointment in 2 [name]. Wound #4 Lateral Foot o Return Appointment in 2 [name]. Wound #2 Left,Lateral Toe Fifth o Return Appointment in 2 [name]. Edema Control Wound #1 Left Malleolus o Elevate legs to the level of the heart and pump ankles as often as possible o Other: - compression stockings Wound #4 Lateral Foot o Elevate legs to the level of the heart and pump ankles as often as possible o Other: - compression stockings Wound #2 Left,Lateral Toe Fifth o Elevate legs to the level of the heart and pump ankles as often as possible o Other: - compression stockings Additional Orders / Instructions [name], [name] ([id number]) Wound #1 Left Malleolus o Increase protein intake. Wound #4 Lateral Foot o Increase protein intake. Wound #2 Left,Lateral Toe Fifth o Increase protein intake. Medications-please add to medication list. Wound #1 Left Malleolus o Other: - Vitamin C, Zinc, Multivitamins Tramadol Wound #4 Lateral Foot o Other: - Vitamin C, Zinc, Multivitamins Tramadol Wound #2 Left,Lateral Toe Fifth o Other: - Vitamin C, Zinc, Multivitamins Tramadol Electronic Signature(s) Signed: 08/02/2016 5:12:34 PM By: [name] Ham MD Signed: 08/03/2016 5:37:51 PM By: Alric Quan Entered By: Alric Quan on 08/02/2016 11:16:29 [name] ([id number]) -------------------------------------------------------------------------------- Problem List Details Patient Name: [name] Date of Service: 08/02/2016 10:45 AM Medical Record Patient Account Number: [id number] Number: Treating RN: [name] June 28, 1919 (82 y.o. 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 Other Clinician: Date of Birth/Sex: Female) Treating [name] Primary Care Physician/Extender: [name] Physician: Referring Physician: Vinson [name] in Treatment: 16 Subjective Chief Complaint Information obtained from Patient chronic wounds on the left lateral ankle and left 5th toe that been present for roughly a year History of Present Illness (HPI) 04/06/16; this is a 82 year old woman who arrives accompanied by 2 daughters for a wound on her left ankle and her left fifth toe. These have apparently been present for a year. I'm not quite certain how she came to this clinic however she was being followed by [name] her podiatrist for these wounds. She was also referred to [facility] and they apparently did a test presumably arterial studies although we don't have any of  these results and we  couldn't get through to the office today. The family but has been applying a combination of Bactroban and a light bandage and perhaps more recently Silvadene cream. She did have an x-ray of the foot roughly 6 months ago at the podiatry office the family was unaware that if there were any abnormalities. Apparently they have not seen any healing here. Our intake nurse noted a slight skin tear on the right anterior lower leg. Nobody seemed aware of this. ABIs calculated in this clinic was 0.3 on the right and 0.4 on the left I have reviewed things [facility]. There is very little information on this patient. She apparently follows in current total clinic which we don't have information from. She has mentioned already been to a [facility]. She has a history of hypothyroidism, nephrolithiasis arthritis and has had a previous mastectomy. 04/13/16; patient's x-ray was normal. She is already been to see [name] vascular surgery. Her arterial exam was from November 2016 this showed a left ABI of 0.62 her right of 0.76. Her duplex ultrasound of the left leg showed biphasic waves in the common femoral and distal femoral artery however monophasic waves in the superficial femoral artery proximal and mid biphasic it distal. 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 The patient tells me that she has pain at night when she tries to lie down which is improved by getting up and sitting in the chair this sounds like claudication at rest. Her wounds are on the left medial malleolus and the dorsal fifth toe small punched out wounds that are right on bone. We use Santyl last week 04/20/16: nurse informed me pt has declined evaluation for significant PAD. she denies systemic s/s of infections. 04/27/16;; the patient has had noninvasive arterial studies done in November 2016. ABI and the left was 0.62. Monophasic waves at the superficial femoral artery. Occluded to the posterior tibial artery. So had greater than 50%  stenosis of the right superficial femoral and greater than 50% stenosis of the left superficial femoral artery. She had bilateral tibial peroneal artery disease. Both of the wounds on the left fifth toe and left lateral malleolus have been present for more than a year. They have been to see vein and [facility] ([id number]) vascular. The patient has some pain but miraculously I think the wounds have largely been stable. No evidence of infection 05/04/16; she goes for a noninvasive study tomorrow and then sees [name] on Monday. By the time she is here next week we should have a better picture of whether something can be done with regards to her arterial flow. We continue to have ischemic-looking wounds on the left fifth toe and left lateral malleolus. 05/10/16; the patient went for her arterial studies and saw [name]. As predicted she is felt to have critical limb ischemia. The feeling is that she has occlusion of the SFA. The feeling would she would be a candidate for a stent to the SFA. The patient did not make a decision to proceed with the procedure and she is here with family members to discuss this me today. He shouldn't has a lot of pain and cannot sleep and rest well at night per her family. 05/24/16; the patient went and had a complex revascularization/angioplasty of the left superficial femoral artery followed by drug-coated balloon angioplasty and spots stenting. She tolerated the procedure well. She was recommended for dual antiplatelet drugs with Plavix and aspirin for at least a month. She went  yesterday for I believe follow-up serial Dopplers and ABIs although I don't see these results. The patient is unfortunately complaining of a lot of pain in her bilateral lower legs below the knees from the ankle to the knees. She apparently was prescribed lidocaine and apparently put this on her legs instead of over the wound areas. This may have something to do with it however there is  a lot of edema in her bilateral legs I was able to find her arterial studies from yesterday. The left ABI has improved up to 0.66 post left SFA stent. The bilateral great toe indices remain abnormal with the left being in the 0.25 range. Duplex ultrasound showed her left SFA stent is patent monophasic waveforms persist in the left leg 06/07/16; continued punched-out areas over the dorsal left fifth toe and left lateral malleolus. No major improvement 06/28/16; the areas over her dorsal left fifth toe and left lateral malleolus are covered in surface slough we are using Iodoflex 07/05/16. We have been using Iodoflex for 2-3 [name] now. I have not been debridement is because of pain 07/19/16 currently we have been using Iodoflex for roughly the past month. Previously we were unable to debride due to pain although today patient states that the pain is not nearly as severe as it has been in the past. In fact she rates this to be a 1 out of 10 and it worse to a to 10 with palpation of the wound. All and all her and her daughter feel like this is actually doing steadily better at this point in time. She is pleased with progress and we have been seeing her every 2 [name]. 08/02/16; this is a delightful 82 year old woman I have not seen in over a month. She has arterial insufficiency wounds remaining over the left lateral malleolus and the left fifth toe. With the help of [name] we are able to get her revascularized on the left. The 2 wounds on the left have been making good progress and are definitely smaller especially the area over the left lateral malleolus. She is certainly in a lot less pain than she used to be although I still think there is some claudication type pain. Unfortunately she is developed a area on the right lateral foot which is a small open area but I think is threatened. I suspect a small ischemic areas well. Also on her right fifth toe there is an area that is not open but looks as  though it is receiving too much pressure from footwear. Finally an area over her right malleolus although I don't think this is on its way to anything ominous Objective [name], [name] ([id number]) Constitutional Sitting or standing Blood Pressure is within target range for patient.Marland Kitchen Heart rate irregular.. Vitals Time Taken: 10:47 AM, Height: 64 in, Weight: 158 lbs, BMI: 27.1, Temperature: 97.8 F, Pulse: 66 bpm, Respiratory Rate: 18 breaths/min, Blood Pressure: 121/91 mmHg. Respiratory Respiratory effort is easy and symmetric bilaterally. Rate is normal at rest and on room air.Marland Kitchen Lymphatic Nonpalpable the popliteal or inguinal area. Psychiatric No evidence of depression, anxiety, or agitation. Calm, cooperative, and communicative. Appropriate interactions and affect.. General Notes: Wound exam; the area on her left lateral malleolus is now down to a very small linear wound the area on her left fifth toe was also small but with some depth. She has a new open area on the right mid foot there is a surface on this although I am quite convinced this is quite open.  There is concerning areas on the right fifth toe and to a lesser extent the right lateral malleolus that are going to need follow-up over time. These are not specific open wounds however I have advised careful follow-up of the right fifth toe and careful offloading Integumentary (Hair, Skin) Wound #1 status is Open. Original cause of wound was Gradually Appeared. The wound is located on the Left Malleolus. The wound measures 0.2cm length x 0.2cm width x 0.2cm depth; 0.031cm^2 area and 0.006cm^3 volume. The wound is limited to skin breakdown. There is no tunneling or undermining noted. There is a large amount of serous drainage noted. The wound margin is thickened. There is medium (34- 66%) pink granulation within the wound bed. There is a medium (34-66%) amount of necrotic tissue within the wound bed including Adherent Slough. The  periwound skin appearance exhibited: Localized Edema, Maceration, Moist, Erythema. The surrounding wound skin color is noted with erythema which is circumferential. Periwound temperature was noted as No Abnormality. The periwound has tenderness on palpation. Wound #2 status is Open. Original cause of wound was Gradually Appeared. The wound is located on the Left,Lateral Toe Fifth. The wound measures 0.4cm length x 0.3cm width x 0.2cm depth; 0.094cm^2 area and 0.019cm^3 volume. The wound is limited to skin breakdown. There is no tunneling or undermining noted. There is a large amount of serous drainage noted. The wound margin is thickened. There is no granulation within the wound bed. There is a large (67-100%) amount of necrotic tissue within the wound bed including Adherent Slough. The periwound skin appearance exhibited: Localized Edema, Maceration, Moist, Mottled. The periwound skin appearance did not exhibit: Callus, Crepitus, Excoriation, Fluctuance, Friable, Induration, Rash, Scarring, Dry/Scaly, Atrophie Blanche, Cyanosis, Ecchymosis, Hemosiderin Staining, Pallor, Rubor, Erythema. Periwound temperature was noted as No Abnormality. The periwound has tenderness on palpation. Wound #4 status is Open. Original cause of wound was Gradually Appeared. The wound is located on the Lateral Foot. The wound measures 0.3cm length x 0.2cm width x 0.1cm depth; 0.047cm^2 area and 0.005cm^3 volume. The wound is limited to skin breakdown. There is no tunneling or undermining noted. [name], [name] ([id number]) There is a large amount of serous drainage noted. The wound margin is distinct with the outline attached to the wound base. There is no granulation within the wound bed. There is a large (67-100%) amount of necrotic tissue within the wound bed including Adherent Slough. The periwound skin appearance exhibited: Moist. Periwound temperature was noted as No Abnormality. The periwound has tenderness on  palpation. 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 - for clinic use Cleanse wound with mild soap and water May Shower, gently pat wound dry prior to applying new dressing. Wound #4 Lateral Foot: Clean wound with Normal Saline. - for clinic use Cleanse wound with mild soap and water May Shower, gently pat wound dry prior to applying new dressing. Wound #2 Left,Lateral Toe Fifth: Clean wound with Normal Saline. - for clinic use Cleanse wound with mild soap and water May Shower, gently pat wound dry prior to applying new dressing. Anesthetic: Wound #1 Left Malleolus: Topical Lidocaine 4% cream applied  to wound bed prior to debridement - for clinic use Wound #4 Lateral Foot: Topical Lidocaine 4% cream applied to wound bed prior to debridement - for clinic use Wound #2 Left,Lateral Toe Fifth: Topical Lidocaine 4% cream applied to wound bed prior to debridement - for clinic use Skin Barriers/Peri-Wound Care: Skin Prep Primary Wound Dressing: Wound #1 Left Malleolus: Iodosorb Ointment [name], [name] ([id number]) Wound #4 Lateral Foot: Iodosorb Ointment Wound #2 Left,Lateral Toe Fifth: Iodosorb Ointment Secondary Dressing: Wound #1 Left Malleolus: Dry Gauze Boardered Foam Dressing Wound #2 Left,Lateral Toe Fifth: Dry Gauze Boardered Foam Dressing Dressing Change Frequency: Wound #1 Left Malleolus: Change dressing every other day. Wound #4 Lateral Foot: Change dressing every other day. Wound #2 Left,Lateral Toe Fifth: Change dressing every other day. Follow-up  Appointments: Wound #1 Left Malleolus: Return Appointment in 2 [name]. Wound #4 Lateral Foot: Return Appointment in 2 [name]. Wound #2 Left,Lateral Toe Fifth: Return Appointment in 2 [name]. Edema Control: Wound #1 Left Malleolus: Elevate legs to the level of the heart and pump ankles as often as possible Other: - compression stockings Wound #4 Lateral Foot: Elevate legs to the level of the heart and pump ankles as often as possible Other: - compression stockings Wound #2 Left,Lateral Toe Fifth: Elevate legs to the level of the heart and pump ankles as often as possible Other: - compression stockings Additional Orders / Instructions: Wound #1 Left Malleolus: Increase protein intake. Wound #4 Lateral Foot: Increase protein intake. Wound #2 Left,Lateral Toe Fifth: Increase protein intake. Medications-please add to medication list.: Wound #1 Left Malleolus: Other: - Vitamin C, Zinc, Multivitamins Tramadol Wound #4 Lateral Foot: Other: - Vitamin C, Zinc, Multivitamins Tramadol Wound #2 Left,Lateral Toe Fifth: Other: - Vitamin C, Zinc, Multivitamins Tramadol [name], [name] ([id number]) #1 I'm going to continue with the Iodosorb ointment to the 2 areas on the left foot and also the right lateral foot. The right lateral foot I am quite convinced is opening Electronic Signature(s) Signed: 08/02/2016 5:12:34 PM By: [name] Ham MD Entered By: [name] on 08/02/2016 12:53:14 [name] ([id number]) -------------------------------------------------------------------------------- [place] Details Patient Name: [name] Date of Service: 08/02/2016 Medical Record Patient Account Number: [id number] Number: Treating RN: [name] May 03, 1919 (82 y.o. 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

## 2016-08-16 ENCOUNTER — Encounter: Payer: Medicare PPO | Attending: Physician Assistant | Admitting: Physician Assistant

## 2016-08-16 DIAGNOSIS — M1990 Unspecified osteoarthritis, unspecified site: Secondary | ICD-10-CM | POA: Insufficient documentation

## 2016-08-16 DIAGNOSIS — I70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: Secondary | ICD-10-CM | POA: Insufficient documentation

## 2016-08-16 DIAGNOSIS — L97211 Non-pressure chronic ulcer of right calf limited to breakdown of skin: Secondary | ICD-10-CM | POA: Diagnosis not present

## 2016-08-16 DIAGNOSIS — I70243 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: Secondary | ICD-10-CM | POA: Diagnosis not present

## 2016-08-16 DIAGNOSIS — L97523 Non-pressure chronic ulcer of other part of left foot with necrosis of muscle: Secondary | ICD-10-CM | POA: Insufficient documentation

## 2016-08-16 DIAGNOSIS — E039 Hypothyroidism, unspecified: Secondary | ICD-10-CM | POA: Insufficient documentation

## 2016-08-17 NOTE — Progress Notes (Addendum)
TERRYLYNN, SHUGARTS ([id number]) Visit Report for 08/16/2016 Arrival Information Details Patient Name: [name], [name] Date of Service: 08/16/2016 10:45 AM Medical Record Number: [id number] Patient Account Number: [id number] Date of Birth/Sex: [birth year] (82 y.o. Female) Treating RN: Ahmed Prima Primary Care Physician: Lorelee Market Other Clinician: Referring Physician: Lorelee Market Treating Physician/Extender: Melburn Hake, HOYT Weeks in Treatment: 18 Visit Information History Since Last Visit All ordered tests and consults were completed: No Patient Arrived: Gilford Rile Added or deleted any medications: No Arrival Time: 10:54 Any new allergies or adverse reactions: No Accompanied By: daughter Had a fall or experienced change in No Transfer Assistance: None activities of daily living that may affect Patient Identification Verified: Yes risk of falls: Secondary Verification Process Yes Signs or symptoms of abuse/neglect since last No Completed: visito Patient Requires Transmission- No Hospitalized since last visit: No Based Precautions: Pain Present Now: No Patient Has Alerts: Yes Patient Alerts: 7/31 ABI: R-0.55 L-0.40 [facility] Electronic Signature(s) Signed: 08/16/2016 5:42:16 PM By: Alric Quan Entered By: Alric Quan on 08/16/2016 10:54:37 [name] ([id number]) -------------------------------------------------------------------------------- Encounter Discharge Information Details Patient Name: [name] Date of Service: 08/16/2016 10:45 AM Medical Record Number: [id number] Patient Account Number: [id number] Date of Birth/Sex: 1919/07/01 (82 y.o. Female) Treating RN: Ahmed Prima Primary Care Physician: Lorelee Market Other Clinician: Referring Physician: Lorelee Market Treating Physician/Extender: Melburn Hake, HOYT Weeks in Treatment: 66 Encounter Discharge Information Items Discharge Pain Level: 0 Discharge  Condition: Stable Ambulatory Status: Walker Discharge Destination: Home Transportation: Private Auto Accompanied By: daughter Schedule Follow-up Appointment: Yes Medication Reconciliation completed Yes and provided to Patient/Care Zelphia Glover: Provided on Clinical Summary of Care: 08/16/2016 Form Type Recipient Paper Patient [facility] Electronic Signature(s) Signed: 08/16/2016 11:45:13 AM By: Ruthine Dose Entered By: Ruthine Dose on 08/16/2016 11:45:13 [name] ([id number]) -------------------------------------------------------------------------------- Lower Extremity Assessment Details Patient Name: [name] Date of Service: 08/16/2016 10:45 AM Medical Record Number: [id number] Patient Account Number: [id number] Date of Birth/Sex: [birth year] (82 y.o. Female) Treating RN: Ahmed Prima Primary Care Physician: Lorelee Market Other Clinician: Referring Physician: Lorelee Market Treating Physician/Extender: Melburn Hake, HOYT Weeks in Treatment: 18 Vascular Assessment Pulses: Posterior Tibial Dorsalis Pedis Palpable: [Left:Yes] [Right:Yes] Extremity colors, hair growth, and conditions: Temperature of Extremity: [Left:Warm] [Right:Warm] Capillary Refill: [Left:< 3 seconds] [Right:< 3 seconds] Toe Nail Assessment Left: Right: Thick: No No Discolored: Yes Yes Deformed: No No Improper Length and Hygiene: No No Electronic Signature(s) Signed: 08/16/2016 5:42:16 PM By: Alric Quan Entered By: Alric Quan on 08/16/2016 10:55:25 [name] ([id number]) -------------------------------------------------------------------------------- Multi Wound Chart Details Patient Name: [name] Date of Service: 08/16/2016 10:45 AM Medical Record Number: [id number] Patient Account Number: [id number] Date of Birth/Sex: 12/15/1918 (82 y.o. Female) Treating RN: Ahmed Prima Primary Care Physician: Lorelee Market Other Clinician: Referring Physician:  Lorelee Market Treating Physician/Extender: Melburn Hake, HOYT Weeks in Treatment: 18 Vital Signs Height(in): 64 Pulse(bpm): 72 Weight(lbs): 158 Blood Pressure 152/60 (mmHg): Body Mass Index(BMI): 27 Temperature(F): 97.7 Respiratory Rate 18 (breaths/min): Photos: [1:No Photos] [2:No Photos] [4:No Photos] Wound Location: [1:Left Malleolus] [2:Left Toe Fifth - Lateral] [4:Right Foot - Lateral] Wounding Event: [1:Gradually Appeared] [2:Gradually Appeared] [4:Gradually Appeared] Primary Etiology: [1:Arterial Insufficiency Ulcer Arterial Insufficiency Ulcer Arterial Insufficiency Ulcer] Comorbid History: [1:Cataracts, Hypertension, Cataracts, Hypertension, Cataracts, Hypertension, Osteoarthritis] [2:Osteoarthritis] [4:Osteoarthritis] Date Acquired: [1:04/07/2015] [2:04/07/2015] [4:07/26/2016] Weeks of Treatment: [1:18] [2:18] [4:2] Wound Status: [1:Open] [2:Open] [4:Open] Measurements L x W x D 0.1x0.1x0.2 [2:0.3x0.3x0.1] [4:0.3x0.2x0.2] (cm) Area (cm) : [1:0.008] [2:0.071] [4:0.047] Volume (cm) : [1:0.002] [2:0.007] [4:0.009] % Reduction in Area: [1:97.90%] [2:-51.10%] [4:0.00%] % Reduction in  Volume: 97.40% [2:22.20%] [4:-80.00%] Classification: [1:Partial Thickness] [2:Partial Thickness] [4:Partial Thickness] Exudate Amount: [1:Large] [2:Large] [4:Large] Exudate Type: [1:Serous] [2:Serous] [4:Serous] Exudate Color: [1:amber] [2:amber] [4:amber] Wound Margin: [1:Thickened] [2:Thickened] [4:Distinct, outline attached] Granulation Amount: [1:Large (67-100%)] [2:None Present (0%)] [4:None Present (0%)] Granulation Quality: [1:Pink] [2:N/A] [4:N/A] Necrotic Amount: [1:Small (1-33%)] [2:Large (67-100%)] [4:Large (67-100%)] Exposed Structures: [1:Fascia: No Fat: No Tendon: No Muscle: No Joint: No Bone: No] [2:Fascia: No Fat: No Tendon: No Muscle: No Joint: No Bone: No] [4:Fascia: No Fat: No Tendon: No Muscle: No Joint: No Bone: No] Limited to Skin Limited to Skin Limited to  Skin Breakdown Breakdown Breakdown Epithelialization: None None None Periwound Skin Texture: Edema: Yes Edema: Yes No Abnormalities Noted Excoriation: No Induration: No Callus: No Crepitus: No Fluctuance: No Friable: No Rash: No Scarring: No Periwound Skin Maceration: Yes Maceration: Yes Moist: Yes Moisture: Moist: Yes Moist: Yes Dry/Scaly: No Periwound Skin Color: Erythema: Yes Mottled: Yes No Abnormalities Noted Atrophie Blanche: No Cyanosis: No Ecchymosis: No Erythema: No Hemosiderin Staining: No Pallor: No Rubor: No Erythema Location: Circumferential N/A N/A Temperature: No Abnormality No Abnormality No Abnormality Tenderness on Yes Yes Yes Palpation: Wound Preparation: Ulcer Cleansing: Ulcer Cleansing: Ulcer Cleansing: Rinsed/Irrigated with Rinsed/Irrigated with Rinsed/Irrigated with Saline Saline Saline Topical Anesthetic Topical Anesthetic Topical Anesthetic Applied: Other: lidocaine Applied: Other: lidocaine Applied: Other: lidocaine 4% 4% 4% Treatment Notes Electronic Signature(s) Signed: 08/16/2016 5:42:16 PM By: Alric Quan Entered By: Alric Quan on 08/16/2016 11:06:55 [name] ([id number]) -------------------------------------------------------------------------------- Multi-Disciplinary Care Plan Details Patient Name: [name] Date of Service: 08/16/2016 10:45 AM Medical Record Number: [id number] Patient Account Number: [id number] Date of Birth/Sex: 11-17-1918 (82 y.o. Female) Treating RN: Ahmed Prima Primary Care Physician: Lorelee Market Other Clinician: Referring Physician: Lorelee Market Treating Physician/Extender: Melburn Hake, HOYT Weeks in Treatment: 31 Active Inactive Abuse / Safety / Falls / Self Care Management Nursing Diagnoses: Potential for falls Goals: Patient will remain injury free Date Initiated: 04/06/2016 Goal Status: Active Interventions: Assess fall risk on admission and as  needed Notes: Nutrition Nursing Diagnoses: Imbalanced nutrition Goals: Patient/caregiver agrees to and verbalizes understanding of need to use nutritional supplements and/or vitamins as prescribed Date Initiated: 04/06/2016 Goal Status: Active Interventions: Assess patient nutrition upon admission and as needed per policy Notes: Orientation to the Wound Care Program Nursing Diagnoses: Knowledge deficit related to the wound healing center program Goals: Patient/caregiver will verbalize understanding of the [place] ([id number]) Date Initiated: 04/06/2016 Goal Status: Active Interventions: Provide education on orientation to the wound center Notes: Pain, Acute or Chronic Nursing Diagnoses: Pain, acute or chronic: actual or potential Potential alteration in comfort, pain Goals: Patient will verbalize adequate pain control and receive pain control interventions during procedures as needed Date Initiated: 04/06/2016 Goal Status: Active Interventions: Assess comfort goal upon admission Complete pain assessment as per visit requirements Notes: Wound/Skin Impairment Nursing Diagnoses: Impaired tissue integrity Goals: Ulcer/skin breakdown will have a volume reduction of 30% by week 4 Date Initiated: 04/06/2016 Goal Status: Active Ulcer/skin breakdown will have a volume reduction of 50% by week 8 Date Initiated: 04/06/2016 Goal Status: Active Ulcer/skin breakdown will have a volume reduction of 80% by week 12 Date Initiated: 04/06/2016 Goal Status: Active Interventions: Assess patient/caregiver ability to obtain necessary supplies Assess ulceration(s) every visit Notes: BERTILLE, NORWOOD ([id number]) Electronic Signature(s) Signed: 08/16/2016 5:42:16 PM By: Alric Quan Entered By: Alric Quan on 08/16/2016 11:06:47 [name] ([id number]) -------------------------------------------------------------------------------- Pain  Assessment Details Patient Name: [name] Date of Service: 08/16/2016 10:45 AM Medical Record  Number: [id number] Patient Account Number: [id number] Date of Birth/Sex: January 05, 1919 (82 y.o. Female) Treating RN: Ahmed Prima Primary Care Physician: Lorelee Market Other Clinician: Referring Physician: Lorelee Market Treating Physician/Extender: Melburn Hake, HOYT Weeks in Treatment: 27 Active Problems Location of Pain Severity and Description of Pain Patient Has Paino No Site Locations With Dressing Change: No Pain Management and Medication Current Pain Management: Electronic Signature(s) Signed: 08/16/2016 5:42:16 PM By: Alric Quan Entered By: Alric Quan on 08/16/2016 10:54:43 [name] ([id number]) -------------------------------------------------------------------------------- Patient/Caregiver Education Details Patient Name: [name] Date of Service: 08/16/2016 10:45 AM Medical Record Number: [id number] Patient Account Number: [id number] Date of Birth/Gender: 12-Jul-1919 (82 y.o. Female) Treating RN: Ahmed Prima Primary Care Physician: Lorelee Market Other Clinician: Referring Physician: Lorelee Market Treating Physician/Extender: Sharalyn Ink in Treatment: 70 Education Assessment Education Provided To: Patient Education Topics Provided Wound/Skin Impairment: Handouts: Other: change dressing as ordered Methods: Demonstration, Explain/Verbal Responses: State content correctly Electronic Signature(s) Signed: 08/16/2016 5:42:16 PM By: Alric Quan Entered By: Alric Quan on 08/16/2016 Crozier, Nhyla ([id number]) -------------------------------------------------------------------------------- Wound Assessment Details Patient Name: [name] Date of Service: 08/16/2016 10:45 AM Medical Record Number: [id number] Patient Account Number: [id number] Date of Birth/Sex: [birth year] (82 y.o.  Female) Treating RN: Carolyne Fiscal, Debi Primary Care Physician: Lorelee Market Other Clinician: Referring Physician: Lorelee Market Treating Physician/Extender: Melburn Hake, HOYT Weeks in Treatment: 18 Wound Status Wound Number: 1 Primary Arterial Insufficiency Ulcer Etiology: Wound Location: Left Malleolus Wound Status: Open Wounding Event: Gradually Appeared Comorbid Cataracts, Hypertension, Date Acquired: 04/07/2015 History: Osteoarthritis Weeks Of Treatment: 18 Clustered Wound: No Photos Photo Uploaded By: Alric Quan on 08/16/2016 11:15:26 Wound Measurements Length: (cm) 0.1 Width: (cm) 0.1 Depth: (cm) 0.2 Area: (cm) 0.008 Volume: (cm) 0.002 % Reduction in Area: 97.9% % Reduction in Volume: 97.4% Epithelialization: None Tunneling: No Undermining: No Wound Description Classification: Partial Thickness Wound Margin: Thickened Exudate Amount: Large Exudate Type: Serous Exudate Color: amber Foul Odor After Cleansing: No Wound Bed Granulation Amount: Large (67-100%) Exposed Structure Granulation Quality: Pink Fascia Exposed: No Necrotic Amount: Small (1-33%) Fat Layer Exposed: No Necrotic Quality: Adherent Slough Tendon Exposed: No Moultry, Rahel ([id number]) Muscle Exposed: No Joint Exposed: No Bone Exposed: No Limited to Skin Breakdown Periwound Skin Texture Texture Color No Abnormalities Noted: No No Abnormalities Noted: No Localized Edema: Yes Erythema: Yes Erythema Location: Circumferential Moisture No Abnormalities Noted: No Temperature / Pain Maceration: Yes Temperature: No Abnormality Moist: Yes Tenderness on Palpation: Yes Wound Preparation Ulcer Cleansing: Rinsed/Irrigated with Saline Topical Anesthetic Applied: Other: lidocaine 4%, Treatment Notes Wound #1 (Left Malleolus) 1. Cleansed with: Clean wound with Normal Saline 2. Anesthetic Topical Lidocaine 4% cream to wound bed prior to debridement 3. Peri-wound Care: Skin  Prep 4. Dressing Applied: Iodosorb Ointment 5. Secondary Dressing Applied ABD Pad Bordered Foam Dressing Electronic Signature(s) Signed: 08/16/2016 5:42:16 PM By: Alric Quan Entered By: Alric Quan on 08/16/2016 11:02:32 [name] ([id number]) -------------------------------------------------------------------------------- Wound Assessment Details Patient Name: [name] Date of Service: 08/16/2016 10:45 AM Medical Record Number: [id number] Patient Account Number: [id number] Date of Birth/Sex: September 16, 1919 (82 y.o. Female) Treating RN: Ahmed Prima Primary Care Physician: Lorelee Market Other Clinician: Referring Physician: Lorelee Market Treating Physician/Extender: Melburn Hake, HOYT Weeks in Treatment: 18 Wound Status Wound Number: 2 Primary Arterial Insufficiency Ulcer Etiology: Wound Location: Left Toe Fifth - Lateral Wound Status: Open Wounding Event: Gradually Appeared Comorbid Cataracts, Hypertension, Date Acquired: 04/07/2015 History: Osteoarthritis Weeks Of Treatment: 18 Clustered Wound: No Photos Photo Uploaded By: Alric Quan on 08/16/2016 11:16:05 Wound Measurements Length: (  cm) 0.3 Width: (cm) 0.3 Depth: (cm) 0.1 Area: (cm) 0.071 Volume: (cm) 0.007 % Reduction in Area: -51.1% % Reduction in Volume: 22.2% Epithelialization: None Tunneling: No Undermining: No Wound Description Classification: Partial Thickness Wound Margin: Thickened Exudate Amount: Large Exudate Type: Serous Exudate Color: amber Foul Odor After Cleansing: No Wound Bed Granulation Amount: None Present (0%) Exposed Structure Necrotic Amount: Large (67-100%) Fascia Exposed: No Necrotic Quality: Adherent Slough Fat Layer Exposed: No Tendon Exposed: No Genna, Rada ([id number]) Muscle Exposed: No Joint Exposed: No Bone Exposed: No Limited to Skin Breakdown Periwound Skin Texture Texture Color No Abnormalities Noted: No No Abnormalities  Noted: No Callus: No Atrophie Blanche: No Crepitus: No Cyanosis: No Excoriation: No Ecchymosis: No Fluctuance: No Erythema: No Friable: No Hemosiderin Staining: No Induration: No Mottled: Yes Localized Edema: Yes Pallor: No Rash: No Rubor: No Scarring: No Temperature / Pain Moisture Temperature: No Abnormality No Abnormalities Noted: No Tenderness on Palpation: Yes Dry / Scaly: No Maceration: Yes Moist: Yes Wound Preparation Ulcer Cleansing: Rinsed/Irrigated with Saline Topical Anesthetic Applied: Other: lidocaine 4%, Treatment Notes Wound #2 (Left, Lateral Toe Fifth) 1. Cleansed with: Clean wound with Normal Saline 2. Anesthetic Topical Lidocaine 4% cream to wound bed prior to debridement 3. Peri-wound Care: Skin Prep 4. Dressing Applied: Iodosorb Ointment 5. Secondary Dressing Applied ABD Pad Bordered Foam Dressing Electronic Signature(s) Signed: 08/16/2016 5:42:16 PM By: Alric Quan Entered By: Alric Quan on 08/16/2016 11:02:58 [name] ([id number]) -------------------------------------------------------------------------------- Wound Assessment Details Patient Name: [name] Date of Service: 08/16/2016 10:45 AM Medical Record Number: [id number] Patient Account Number: [id number] Date of Birth/Sex: Jul 26, 1919 (82 y.o. Female) Treating RN: Carolyne Fiscal, Debi Primary Care Physician: Lorelee Market Other Clinician: Referring Physician: Lorelee Market Treating Physician/Extender: Melburn Hake, HOYT Weeks in Treatment: 18 Wound Status Wound Number: 4 Primary Arterial Insufficiency Ulcer Etiology: Wound Location: Right Foot - Lateral Wound Status: Open Wounding Event: Gradually Appeared Comorbid Cataracts, Hypertension, Date Acquired: 07/26/2016 History: Osteoarthritis Weeks Of Treatment: 2 Clustered Wound: No Photos Photo Uploaded By: Alric Quan on 08/16/2016 11:16:06 Wound Measurements Length: (cm) 0.3 Width: (cm)  0.2 Depth: (cm) 0.2 Area: (cm) 0.047 Volume: (cm) 0.009 % Reduction in Area: 0% % Reduction in Volume: -80% Epithelialization: None Tunneling: No Undermining: No Wound Description Classification: Partial Thickness Wound Margin: Distinct, outline attached Exudate Amount: Large Exudate Type: Serous Exudate Color: amber Foul Odor After Cleansing: No Wound Bed Granulation Amount: None Present (0%) Exposed Structure Necrotic Amount: Large (67-100%) Fascia Exposed: No Necrotic Quality: Adherent Slough Fat Layer Exposed: No Tendon Exposed: No Nigh, Myonna ([id number]) Muscle Exposed: No Joint Exposed: No Bone Exposed: No Limited to Skin Breakdown Periwound Skin Texture Texture Color No Abnormalities Noted: No No Abnormalities Noted: No Moisture Temperature / Pain No Abnormalities Noted: No Temperature: No Abnormality Moist: Yes Tenderness on Palpation: Yes Wound Preparation Ulcer Cleansing: Rinsed/Irrigated with Saline Topical Anesthetic Applied: Other: lidocaine 4%, Treatment Notes Wound #4 (Right, Lateral Foot) 1. Cleansed with: Clean wound with Normal Saline 2. Anesthetic Topical Lidocaine 4% cream to wound bed prior to debridement 3. Peri-wound Care: Skin Prep 4. Dressing Applied: Iodosorb Ointment 5. Secondary Dressing Applied ABD Pad Bordered Foam Dressing Electronic Signature(s) Signed: 08/16/2016 5:42:16 PM By: Alric Quan Entered By: Alric Quan on 08/16/2016 11:03:30 [name] ([id number]) -------------------------------------------------------------------------------- Vitals Details Patient Name: [name] Date of Service: 08/16/2016 10:45 AM Medical Record Number: [id number] Patient Account Number: [id number] Date of Birth/Sex: 1919/06/28 (82 y.o. Female) Treating RN: Ahmed Prima Primary Care Physician: Lorelee Market Other Clinician: Referring Physician: Lorelee Market  Treating Physician/Extender: STONE  III, HOYT Weeks in Treatment: 18 Vital Signs Time Taken: 10:57 Temperature (F): 97.7 Height (in): 64 Pulse (bpm): 72 Weight (lbs): 158 Respiratory Rate (breaths/min): 18 Body Mass Index (BMI): 27.1 Blood Pressure (mmHg): 152/60 Reference Range: 80 - 120 mg / dl Electronic Signature(s) Signed: 08/16/2016 5:42:16 PM By: Alric Quan Entered By: Alric Quan on 08/16/2016 10:57:53

## 2016-08-17 NOTE — Progress Notes (Addendum)
AZMARIAH, SUPINGER ([id number]) Visit Report for 08/16/2016 Chief Complaint Document Details Patient Name: [name], [name] 08/16/2016 10:45 Date of Service: AM Medical Record [id number] Number: Patient Account Number: [id number] Oct 03, 1919 (82 y.o. Treating RN: Ahmed Prima Date of Birth/Sex: Female) Other Clinician: Primary Care Physician: [name], [name] Treating STONE III, HOYT Referring Physician: Lorelee Market Physician/Extender: Suella Grove in Treatment: 18 Information Obtained from: Patient Chief Complaint chronic wounds on the left lateral ankle and left 5th toe that been present for roughly a year Electronic Signature(s) Signed: 08/17/2016 8:57:57 PM By: Worthy Keeler PA-C Entered By: Worthy Keeler on 08/17/2016 16:49:32 [name] ([id number]) -------------------------------------------------------------------------------- Debridement Details Patient Name: [name], [name] 08/16/2016 10:45 Date of Service: AM Medical Record [id number] Number: Patient Account Number: [id number] 25-Sep-1919 (82 y.o. Treating RN: Ahmed Prima Date of Birth/Sex: Female) Other Clinician: Primary Care Physician: [name], [name] Treating STONE III, Jefferson Referring Physician: Lorelee Market Physician/Extender: Suella Grove in Treatment: 18 Debridement Performed for Wound #4 Right,Lateral Foot Assessment: Performed By: Physician STONE III, HOYT E., PA-C Debridement: Debridement Pre-procedure Yes - 11:26 Verification/Time Out Taken: Start Time: 11:27 Pain Control: Lidocaine 4% Topical Solution Level: Skin/Subcutaneous Tissue Total Area Debrided (L x 0.3 (cm) x 0.2 (cm) = 0.06 (cm) W): Tissue and other Viable, Non-Viable, Exudate, Fibrin/Slough, Subcutaneous material debrided: Instrument: Curette Bleeding: Minimum Hemostasis Achieved: Pressure End Time: 11:29 Procedural Pain: 0 Post Procedural Pain: 0 Response to Treatment: Procedure was tolerated well Post  Debridement Measurements of Total Wound Length: (cm) 0.3 Width: (cm) 0.2 Depth: (cm) 0.2 Volume: (cm) 0.009 Character of Wound/Ulcer Post Requires Further Debridement Debridement: Severity of Tissue Post Debridement: Fat layer exposed Post Procedure Diagnosis Same as Pre-procedure Electronic Signature(s) Signed: 08/16/2016 5:42:16 PM By: Alric Quan Signed: 08/17/2016 12:52:14 AM By: [name], Isobella ([id number]) Entered By: Alric Quan on 08/16/2016 11:29:11 [name] ([id number]) -------------------------------------------------------------------------------- HPI Details Patient Name: [name], [name] 08/16/2016 10:45 Date of Service: AM Medical Record [id number] Number: Patient Account Number: [id number] 08-08-1919 (82 y.o. Treating RN: Ahmed Prima Date of Birth/Sex: Female) Other Clinician: Primary Care Physician: Lorelee Market Treating STONE III, HOYT Referring Physician: Lorelee Market Physician/Extender: Suella Grove in Treatment: 18 History of Present Illness HPI Description: 04/06/16; this is a 82 year old woman who arrives accompanied by 2 daughters for a wound on her left ankle and her left fifth toe. These have apparently been present for a year. I'm not quite certain how she came to this clinic however she was being followed by Sharlotte Alamo her podiatrist for these wounds. She was also referred to [facility] and they apparently did a test presumably arterial studies although we don't have any of these results and we couldn't get through to the office today. The family but has been applying a combination of Bactroban and a light bandage and perhaps more recently Silvadene cream. She did have an x-ray of the foot roughly 6 months ago at the podiatry office the family was unaware that if there were any abnormalities. Apparently they have not seen any healing here. Our intake nurse noted a slight skin tear on the  right anterior lower leg. Nobody seemed aware of this. ABIs calculated in this clinic was 0.3 on the right and 0.4 on the left I have reviewed things [facility]. There is very little information on this patient. She apparently follows in current total clinic which we don't have information from. She has mentioned already been to a [facility]. She has a history of hypothyroidism, nephrolithiasis arthritis and has had a  previous mastectomy. 04/13/16; patient's x-ray was normal. She is already been to see Dr. Delana Meyer vascular surgery. Her arterial exam was from November 2016 this showed a left ABI of 0.62 her right of 0.76. Her duplex ultrasound of the left leg showed biphasic waves in the common femoral and distal femoral artery however monophasic waves in the superficial femoral artery proximal and mid biphasic it distal. 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 The patient tells me that she has pain at night when she tries to lie down which is improved by getting up and sitting in the chair this sounds like claudication at rest. Her wounds are on the left medial malleolus and the dorsal fifth toe small punched out wounds that are right on bone. We use Santyl last week 04/20/16: nurse informed me pt has declined evaluation for significant PAD. she denies systemic s/s of infections. 04/27/16;; the patient has had noninvasive arterial studies done in November 2016. ABI and the left was 0.62. Monophasic waves at the superficial femoral artery. Occluded to the posterior tibial artery. So had greater than 50% stenosis of the right superficial femoral and greater than 50% stenosis of the left superficial femoral artery. She had bilateral tibial peroneal artery disease. Both of the wounds on the left fifth toe and left lateral malleolus have been present for more than a year. They have been to see vein and vascular. The patient has some pain but miraculously I think the wounds have largely been stable.  [name] evidence of infection 05/04/16; she goes for a noninvasive study tomorrow and then sees Dr. Fletcher Anon on Monday. By the time she is here next week we should have a better picture of whether something can be done with regards to her arterial flow. We continue to have ischemic-looking wounds on the left fifth toe and left lateral malleolus. 05/10/16; the patient went for her arterial studies and saw Dr. Fletcher Anon. As predicted she is felt to have critical limb ischemia. The feeling is that she has occlusion of the SFA. The feeling would she would be a [name], [name] ([id number]) candidate for a stent to the SFA. The patient did not make a decision to proceed with the procedure and she is here with family members to discuss this me today. He shouldn't has a lot of pain and cannot sleep and rest well at night per her family. 05/24/16; the patient went and had a complex revascularization/angioplasty of the left superficial femoral artery followed by drug-coated balloon angioplasty and spots stenting. She tolerated the procedure well. She was recommended for dual antiplatelet drugs with Plavix and aspirin for at least a month. She went yesterday for I believe follow-up serial Dopplers and ABIs although I don't see these results. The patient is unfortunately complaining of a lot of pain in her bilateral lower legs below the knees from the ankle to the knees. She apparently was prescribed lidocaine and apparently put this on her legs instead of over the wound areas. This may have something to do with it however there is a lot of edema in her bilateral legs I was able to find her arterial studies from yesterday. The left ABI has improved up to 0.66 post left SFA stent. The bilateral great toe indices remain abnormal with the left being in the 0.25 range. Duplex ultrasound showed her left SFA stent is patent monophasic waveforms persist in the left leg 06/07/16; continued punched-out areas over the dorsal left  fifth toe and left lateral malleolus. [name] major improvement 06/28/16;  the areas over her dorsal left fifth toe and left lateral malleolus are covered in surface slough we are using Iodoflex 07/05/16. We have been using Iodoflex for 2-3 weeks now. I have not been debridement is because of pain 07/19/16 currently we have been using Iodoflex for roughly the past month. Previously we were unable to debride due to pain although today patient states that the pain is not nearly as severe as it has been in the past. In fact she rates this to be a 1 out of 10 and it worse to a to 10 with palpation of the wound. All and all her and her daughter feel like this is actually doing steadily better at this point in time. She is pleased with progress and we have been seeing her every 2 weeks. 08/02/16; this is a delightful 82 year old woman I have not seen in over a month. She has arterial insufficiency wounds remaining over the left lateral malleolus and the left fifth toe. With the help of Dr. Fletcher Anon we are able to get her revascularized on the left. The 2 wounds on the left have been making good progress and are definitely smaller especially the area over the left lateral malleolus. She is certainly in a lot less pain than she used to be although I still think there is some claudication type pain. Unfortunately she is developed a area on the right lateral foot which is a small open area but I think is threatened. I suspect a small ischemic areas well. Also on her right fifth toe there is an area that is not open but looks as though it is receiving too much pressure from footwear. Finally an area over her right malleolus although I don't think this is on its way to anything ominous 08/17/16 patient presents today for follow up evaluation she tells me that she is really doing fairly well from a pain standpoint compared to where she has been previous. She is tolerating the dressing changes without any complication.  She continues to have discharge and drainage however. Electronic Signature(s) Signed: 08/17/2016 8:57:57 PM By: Worthy Keeler PA-C Entered By: Worthy Keeler on 08/17/2016 16:55:14 [name] ([id number]) -------------------------------------------------------------------------------- Physical Exam Details Patient Name: [name], [name] 08/16/2016 10:45 Date of Service: AM Medical Record [id number] Number: Patient Account Number: [id number] 16-Dec-1918 (82 y.o. Treating RN: Ahmed Prima Date of Birth/Sex: Female) Other Clinician: Primary Care Physician: [name], [name] Treating STONE III, HOYT Referring Physician: Lorelee Market Physician/Extender: Suella Grove in Treatment: 34 Constitutional Well-nourished and well-hydrated in [name] acute distress. Respiratory normal breathing without difficulty. Psychiatric this patient is able to make decisions and demonstrates good insight into disease process. Alert and Oriented x 3. pleasant and cooperative. Notes Patient's left foot and ankle wounds appear to be doing significantly better on evaluation today. The right lateral foot wound is somewhat slough covered but does not appear infected at this point. Electronic Signature(s) Signed: 08/17/2016 8:57:57 PM By: Worthy Keeler PA-C Entered By: Worthy Keeler on 08/17/2016 16:56:16 [name] ([id number]) -------------------------------------------------------------------------------- Physician Orders Details Patient Name: [name], [name] 08/16/2016 10:45 Date of Service: AM Medical Record [id number] Number: Patient Account Number: [id number] 31-Oct-1918 (82 y.o. Treating RN: Ahmed Prima Date of Birth/Sex: Female) Other Clinician: Primary Care Physician: [name], [name] Treating STONE III, HOYT Referring Physician: Lorelee Market Physician/Extender: Suella Grove in Treatment: 18 Verbal / Phone Orders: Yes Clinician: Carolyne Fiscal, Debi Read Back and Verified:  Yes Diagnosis Coding Wound Cleansing Wound #1 Left Malleolus o Clean wound with Normal Saline. -  for clinic use o Cleanse wound with mild soap and water o May Shower, gently pat wound dry prior to applying new dressing. Wound #2 Left,Lateral Toe Fifth o Clean wound with Normal Saline. - for clinic use o Cleanse wound with mild soap and water o May Shower, gently pat wound dry prior to applying new dressing. Wound #4 Right,Lateral Foot o Clean wound with Normal Saline. - for clinic use o Cleanse wound with mild soap and water o May Shower, gently pat wound dry prior to applying new dressing. Anesthetic Wound #1 Left Malleolus o Topical Lidocaine 4% cream applied to wound bed prior to debridement - for clinic use Wound #2 Left,Lateral Toe Fifth o Topical Lidocaine 4% cream applied to wound bed prior to debridement - for clinic use Wound #4 Right,Lateral Foot o Topical Lidocaine 4% cream applied to wound bed prior to debridement - for clinic use Skin Barriers/Peri-Wound Care o Skin Prep Primary Wound Dressing Wound #1 Left Malleolus o Iodosorb Ointment Wound #2 Left,Lateral Toe Fifth o Iodosorb Ointment [name], [name] ([id number]) Wound #4 Right,Lateral Foot o Iodosorb Ointment Secondary Dressing Wound #1 Left Malleolus o Dry Gauze o Boardered Foam Dressing Wound #2 Left,Lateral Toe Fifth o Dry Gauze o Boardered Foam Dressing Dressing Change Frequency Wound #1 Left Malleolus o Change dressing every other day. Wound #2 Left,Lateral Toe Fifth o Change dressing every other day. Wound #4 Right,Lateral Foot o Change dressing every other day. Follow-up Appointments Wound #1 Left Malleolus o Return Appointment in 2 weeks. Wound #2 Left,Lateral Toe Fifth o Return Appointment in 2 weeks. Wound #4 Right,Lateral Foot o Return Appointment in 2 weeks. Edema Control Wound #1 Left Malleolus o Elevate legs to the level of the  heart and pump ankles as often as possible o Other: - compression stockings Wound #2 Left,Lateral Toe Fifth o Elevate legs to the level of the heart and pump ankles as often as possible o Other: - compression stockings Wound #4 Right,Lateral Foot o Elevate legs to the level of the heart and pump ankles as often as possible o Other: - compression stockings Additional Orders / Instructions [name], [name] ([id number]) Wound #1 Left Malleolus o Increase protein intake. Wound #2 Left,Lateral Toe Fifth o Increase protein intake. Wound #4 Right,Lateral Foot o Increase protein intake. Medications-please add to medication list. Wound #1 Left Malleolus o Other: - Vitamin C, Zinc, Multivitamins Tramadol Wound #2 Left,Lateral Toe Fifth o Other: - Vitamin C, Zinc, Multivitamins Tramadol Wound #4 Right,Lateral Foot o Other: - Vitamin C, Zinc, Multivitamins Tramadol Electronic Signature(s) Signed: 08/16/2016 5:42:16 PM By: Alric Quan Signed: 08/17/2016 12:52:14 AM By: Worthy Keeler PA-C Entered By: Alric Quan on 08/16/2016 11:29:43 [name] ([id number]) -------------------------------------------------------------------------------- Problem List Details Patient Name: JASMYNE, SIEMINSKI 08/16/2016 10:45 Date of Service: AM Medical Record [id number] Number: Patient Account Number: [id number] 12-22-1918 (82 y.o. 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 Treating RN: Ahmed Prima Date of Birth/Sex: Female) Other Clinician: Primary Care Physician: Lorelee Market Treating STONE III, HOYT Referring Physician: Lorelee Market Physician/Extender: Suella Grove in Treatment: 18 Subjective Chief Complaint Information obtained from Patient chronic wounds on the left lateral ankle and left 5th toe that been present for roughly a year History of Present Illness (HPI) 04/06/16; this is a 82 year old woman who arrives accompanied by 2 daughters for a wound on her left ankle and her left fifth toe. These have apparently been present for a year. I'm not quite certain how she came to this clinic however she was being followed by Sharlotte Alamo her podiatrist for these wounds. She was also referred to [facility] and they apparently did a test presumably arterial studies although we don't have any of these results and we couldn't get through to the office today. The family but has been applying a combination of Bactroban and a light bandage and perhaps more recently Silvadene cream. She did have an x-ray of the foot roughly 6 months ago at the podiatry office the family was unaware that if there were any abnormalities. Apparently they have not seen any healing here. Our intake nurse noted a slight skin tear on the right anterior lower leg.  Nobody seemed aware of this. ABIs calculated in this clinic was 0.3 on the right and 0.4 on the left I have reviewed things in [facility]. There is very little information on this patient. She apparently follows in current total clinic which we don't have information from. She has mentioned already been to a [facility]. She has a history of hypothyroidism, nephrolithiasis arthritis and has had a previous mastectomy. 04/13/16; patient's x-ray was normal. She is already been to see Dr. Delana Meyer vascular surgery. Her arterial exam was from November 2016 this showed a left ABI of 0.62 her right of 0.76. Her duplex ultrasound of the left leg showed biphasic waves in the common femoral and distal femoral artery however monophasic waves in the superficial femoral artery proximal and mid biphasic it distal. 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 The patient tells me that she has pain at night when she tries to lie down which is improved by getting up and sitting in the chair this sounds like claudication at rest. Her wounds are on the left medial malleolus and the dorsal fifth toe small punched out wounds that are right on bone. We use Santyl last week 04/20/16: nurse informed me pt has declined evaluation for significant PAD. she denies systemic s/s of infections. 04/27/16;; the patient has had noninvasive arterial studies done in November 2016. ABI and the left was 0.62. Monophasic waves at the superficial femoral artery. Occluded to the posterior tibial artery. So had greater than 50% stenosis of the right superficial femoral and greater than 50% stenosis of the left superficial femoral artery. She had bilateral tibial peroneal artery disease. Both of the wounds on the left fifth toe and left lateral malleolus have been present for more than a year. They have been to see vein and vascular. The patient has some pain but miraculously I think the wounds have largely been stable. [name] Brackin, Lilyann  ([id number]) evidence of infection 05/04/16; she goes for a noninvasive study tomorrow and then sees Dr. Fletcher Anon on Monday. By the  time she is here next week we should have a better picture of whether something can be done with regards to her arterial flow. We continue to have ischemic-looking wounds on the left fifth toe and left lateral malleolus. 05/10/16; the patient went for her arterial studies and saw Dr. Fletcher Anon. As predicted she is felt to have critical limb ischemia. The feeling is that she has occlusion of the SFA. The feeling would she would be a candidate for a stent to the SFA. The patient did not make a decision to proceed with the procedure and she is here with family members to discuss this me today. He shouldn't has a lot of pain and cannot sleep and rest well at night per her family. 05/24/16; the patient went and had a complex revascularization/angioplasty of the left superficial femoral artery followed by drug-coated balloon angioplasty and spots stenting. She tolerated the procedure well. She was recommended for dual antiplatelet drugs with Plavix and aspirin for at least a month. She went yesterday for I believe follow-up serial Dopplers and ABIs although I don't see these results. The patient is unfortunately complaining of a lot of pain in her bilateral lower legs below the knees from the ankle to the knees. She apparently was prescribed lidocaine and apparently put this on her legs instead of over the wound areas. This may have something to do with it however there is a lot of edema in her bilateral legs I was able to find her arterial studies from yesterday. The left ABI has improved up to 0.66 post left SFA stent. The bilateral great toe indices remain abnormal with the left being in the 0.25 range. Duplex ultrasound showed her left SFA stent is patent monophasic waveforms persist in the left leg 06/07/16; continued punched-out areas over the dorsal left fifth toe and left  lateral malleolus. [name] major improvement 06/28/16; the areas over her dorsal left fifth toe and left lateral malleolus are covered in surface slough we are using Iodoflex 07/05/16. We have been using Iodoflex for 2-3 weeks now. I have not been debridement is because of pain 07/19/16 currently we have been using Iodoflex for roughly the past month. Previously we were unable to debride due to pain although today patient states that the pain is not nearly as severe as it has been in the past. In fact she rates this to be a 1 out of 10 and it worse to a to 10 with palpation of the wound. All and all her and her daughter feel like this is actually doing steadily better at this point in time. She is pleased with progress and we have been seeing her every 2 weeks. 08/02/16; this is a delightful 82 year old woman I have not seen in over a month. She has arterial insufficiency wounds remaining over the left lateral malleolus and the left fifth toe. With the help of Dr. Fletcher Anon we are able to get her revascularized on the left. The 2 wounds on the left have been making good progress and are definitely smaller especially the area over the left lateral malleolus. She is certainly in a lot less pain than she used to be although I still think there is some claudication type pain. Unfortunately she is developed a area on the right lateral foot which is a small open area but I think is threatened. I suspect a small ischemic areas well. Also on her right fifth toe there is an area that is not open but looks as though it  is receiving too much pressure from footwear. Finally an area over her right malleolus although I don't think this is on its way to anything ominous 08/17/16 patient presents today for follow up evaluation she tells me that she is really doing fairly well from a pain standpoint compared to where she has been previous. She is tolerating the dressing changes without any complication. She continues to  have discharge and drainage however. [name], [name] ([id number]) Objective Constitutional Well-nourished and well-hydrated in [name] acute distress. Vitals Time Taken: 10:57 AM, Height: 64 in, Weight: 158 lbs, BMI: 27.1, Temperature: 97.7 F, Pulse: 72 bpm, Respiratory Rate: 18 breaths/min, Blood Pressure: 152/60 mmHg. Respiratory normal breathing without difficulty. Psychiatric this patient is able to make decisions and demonstrates good insight into disease process. Alert and Oriented x 3. pleasant and cooperative. General Notes: Patient's left foot and ankle wounds appear to be doing significantly better on evaluation today. The right lateral foot wound is somewhat slough covered but does not appear infected at this point. Integumentary (Hair, Skin) Wound #1 status is Open. Original cause of wound was Gradually Appeared. The wound is located on the Left Malleolus. The wound measures 0.1cm length x 0.1cm width x 0.2cm depth; 0.008cm^2 area and 0.002cm^3 volume. The wound is limited to skin breakdown. There is [name] tunneling or undermining noted. There is a large amount of serous drainage noted. The wound margin is thickened. There is large (67- 100%) pink granulation within the wound bed. There is a small (1-33%) amount of necrotic tissue within the wound bed including Adherent Slough. The periwound skin appearance exhibited: Localized Edema, Maceration, Moist, Erythema. The surrounding wound skin color is noted with erythema which is circumferential. Periwound temperature was noted as [name] Abnormality. The periwound has tenderness on palpation. Wound #2 status is Open. Original cause of wound was Gradually Appeared. The wound is located on the Left,Lateral Toe Fifth. The wound measures 0.3cm length x 0.3cm width x 0.1cm depth; 0.071cm^2 area and 0.007cm^3 volume. The wound is limited to skin breakdown. There is [name] tunneling or undermining noted. There is a large amount of serous drainage  noted. The wound margin is thickened. There is [name] granulation within the wound bed. There is a large (67-100%) amount of necrotic tissue within the wound bed including Adherent Slough. The periwound skin appearance exhibited: Localized Edema, Maceration, Moist, Mottled. The periwound skin appearance did not exhibit: Callus, Crepitus, Excoriation, Fluctuance, Friable, Induration, Rash, Scarring, Dry/Scaly, Atrophie Blanche, Cyanosis, Ecchymosis, Hemosiderin Staining, Pallor, Rubor, Erythema. Periwound temperature was noted as [name] Abnormality. The periwound has tenderness on palpation. Wound #4 status is Open. Original cause of wound was Gradually Appeared. The wound is located on the Right,Lateral Foot. The wound measures 0.3cm length x 0.2cm width x 0.2cm depth; 0.047cm^2 area and 0.009cm^3 volume. The wound is limited to skin breakdown. There is [name] tunneling or undermining noted. There is a large amount of serous drainage noted. The wound margin is distinct with the outline attached to the wound base. There is [name] granulation within the wound bed. There is a large (67-100%) amount of necrotic tissue within the wound bed including Adherent Slough. The periwound skin appearance exhibited: Tobler, Crickett ([id number]) Moist. Periwound temperature was noted as [name] Abnormality. The periwound has tenderness on palpation. 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 There was a Skin/Subcutaneous Tissue Debridement [id number]) debridement with total area of 0.06 sq  cm performed by STONE III, HOYT E., PA-C. with the following instrument(s): Curette to remove Viable and Non-Viable tissue/material including Exudate, Fibrin/Slough, and Subcutaneous after achieving pain control using Lidocaine 4% Topical Solution. A time out was conducted at 11:26, prior to the start of the procedure. A Minimum amount of bleeding was controlled with Pressure. The procedure was tolerated well with a pain level of 0 throughout and a pain level of 0 following the procedure. Post Debridement Measurements: 0.3cm length x 0.2cm width x 0.2cm depth; 0.009cm^3 volume. Character of Wound/Ulcer Post Debridement requires further debridement. Severity of Tissue Post Debridement is: Fat layer exposed. Post procedure Diagnosis Wound #4: Same as Pre-Procedure Plan Wound Cleansing: Wound #1 Left Malleolus: Clean wound with Normal Saline. - for clinic use Cleanse wound with mild soap and water May Shower, gently pat wound dry prior to applying new dressing. Wound #2 Left,Lateral Toe Fifth: Clean wound with Normal Saline. - for clinic use Dearing, Hannia ([id number]) Cleanse wound with mild soap and water May Shower, gently pat wound dry prior to applying new dressing. Wound #4 Right,Lateral Foot: Clean wound with Normal Saline. - for clinic use Cleanse wound with mild soap and water May Shower, gently pat wound dry prior to applying new dressing. Anesthetic: Wound #1 Left Malleolus: Topical Lidocaine 4% cream applied to wound bed prior to debridement - for clinic use Wound #2 Left,Lateral Toe Fifth: Topical Lidocaine 4% cream applied to wound bed prior to debridement - for clinic use Wound #4 Right,Lateral Foot: Topical Lidocaine 4% cream applied to wound bed prior to debridement - for clinic use Skin Barriers/Peri-Wound Care: Skin Prep Primary Wound Dressing: Wound #1 Left Malleolus: Iodosorb Ointment Wound #2 Left,Lateral Toe Fifth: Iodosorb Ointment Wound #4 Right,Lateral  Foot: Iodosorb Ointment Secondary Dressing: Wound #1 Left Malleolus: Dry Gauze Boardered Foam Dressing Wound #2 Left,Lateral Toe Fifth: Dry Gauze Boardered Foam Dressing Dressing Change Frequency: Wound #1 Left Malleolus: Change dressing every other day. Wound #2 Left,Lateral Toe Fifth: Change dressing every other day. Wound #4 Right,Lateral Foot: Change dressing every other day. Follow-up Appointments: Wound #1 Left Malleolus: Return Appointment in 2 weeks. Wound #2 Left,Lateral Toe Fifth: Return Appointment in 2 weeks. Wound #4 Right,Lateral Foot: Return Appointment in 2 weeks. Edema Control: Wound #1 Left Malleolus: Elevate legs to the level of the heart and pump ankles as often as possible Other: - compression stockings Wound #2 Left,Lateral Toe Fifth: Elevate legs to the level of the heart and pump ankles as often as possible Other: - compression stockings Wound #4 Right,Lateral Foot: Pease, Kayleigh ([id number]) Elevate legs to the level of the heart and pump ankles as often as possible Other: - compression stockings Additional Orders / Instructions: Wound #1 Left Malleolus: Increase protein intake. Wound #2 Left,Lateral Toe Fifth: Increase protein intake. Wound #4 Right,Lateral Foot: Increase protein intake. Medications-please add to medication list.: Wound #1 Left Malleolus: Other: - Vitamin C, Zinc, Multivitamins Tramadol Wound #2 Left,Lateral Toe Fifth: Other: - Vitamin C, Zinc, Multivitamins Tramadol Wound #4 Right,Lateral Foot: Other: - Vitamin C, Zinc, Multivitamins Tramadol Follow-Up Appointments: A follow-up appointment should be scheduled. Medication Reconciliation completed and provided to Patient/Care Provider. A Patient Clinical Summary of Care was provided to  EC Patient tolerated the debridement today without complication. I'm going to recommend that we continue with the isosorbide ointment to all locations as this seems to be doing very well.  She is in agreement with the plan. All questions and concerns were answered to the best of my ability and we will see her back for reevaluation in one week. Electronic Signature(s) Signed: 08/17/2016 8:57:57 PM By: Worthy Keeler PA-C Entered By: Worthy Keeler on 08/17/2016 16:57:05 [name] ([id number]) -------------------------------------------------------------------------------- SuperBill Details Patient Name: [name] Date of Service: 08/16/2016 Medical Record Number: [id number] Patient Account Number: [id number] Date of Birth/Sex: November 28, 1918 (82 y.o. 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 right calf limited to breakdown of skin Facility Procedures CPT4 Code Description: [id number] - DEB SUBQ TISSUE 20 SQ CM/< ICD-10 Description Diagnosis L97.523 Non-pressure chronic ulcer of other part of left foot w L97.211 Non-pressure chronic ulcer of right calf limited to bre Modifier: ith necrosis of m akdown of skin Quantity: 1 uscle Physician Procedures CPT4 Code Description: [id number] - WC PHYS SUBQ TISS 20 SQ CM ICD-10 Description Diagnosis L97.523 Non-pressure chronic ulcer of other part of left foot wi L97.211 Non-pressure chronic ulcer of right calf limited to brea Modifier: th necrosis of m kdown of skin Quantity: 1 uscle Electronic Signature(s) Signed: 08/17/2016 12:52:14 AM By: Worthy Keeler PA-C Entered By: Worthy Keeler on 08/17/2016 00:01:04

## 2016-08-30 ENCOUNTER — Encounter: Payer: Medicare PPO | Admitting: Nurse Practitioner

## 2016-08-30 DIAGNOSIS — I70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: Secondary | ICD-10-CM | POA: Diagnosis not present

## 2016-08-31 NOTE — Progress Notes (Signed)
[name], [name] ([id number]) Visit Report for 08/30/2016 Arrival Information Details Patient Name: QUASHA, SPEIDEL Date of Service: 08/30/2016 10:45 AM Medical Record Patient Account Number: [id number] Number: Treating RN: Ahmed Prima 01/31/1919 (82 y.o. Other Clinician: Date of Birth/Sex: Female) Treating ROBSON, [place] Primary Care Physician: Lorelee Market Physician/Extender: G Referring Physician: Vinson Moselle in Treatment: 45 Visit Information History Since Last Visit All ordered tests and consults were completed: No Patient Arrived: Gilford Rile Added or deleted any medications: No Arrival Time: 10:35 Any new allergies or adverse reactions: No Accompanied By: daughter Had a fall or experienced change in No Transfer Assistance: None activities of daily living that may affect Patient Identification Verified: Yes risk of falls: Secondary Verification Process Yes Signs or symptoms of abuse/neglect since last No Completed: visito Patient Requires Transmission- No Hospitalized since last visit: No Based Precautions: Pain Present Now: No Patient Has Alerts: Yes Patient Alerts: 7/31 ABI: R-0.55 L-0.40 [facility] Electronic Signature(s) Signed: 08/30/2016 4:33:45 PM By: Alric Quan Entered By: Alric Quan on 08/30/2016 10:37:35 [name] ([id number]) -------------------------------------------------------------------------------- Encounter Discharge Information Details Patient Name: [name] Date of Service: 08/30/2016 10:45 AM Medical Record Number: [id number] Patient Account Number: [id number] Date of Birth/Sex: 1919/01/04 (82 y.o. Female) Treating RN: Ahmed Prima Primary Care Physician: Lorelee Market Other Clinician: Referring Physician: Lorelee Market Treating Physician/Extender: Cathie Olden in Treatment: 20 Encounter Discharge Information Items Discharge Pain Level: 0 Discharge  Condition: Stable Ambulatory Status: Walker Discharge Destination: Home Transportation: Private Auto Accompanied By: daughter Schedule Follow-up Appointment: Yes Medication Reconciliation completed and provided to Patient/Care Yes Kaidyn Javid: Provided on Clinical Summary of Care: 08/30/2016 Form Type Recipient Paper Patient [facility] Electronic Signature(s) Signed: 08/30/2016 11:35:08 AM By: Ruthine Dose Entered By: Ruthine Dose on 08/30/2016 11:35:08 [name] ([id number]) -------------------------------------------------------------------------------- Lower Extremity Assessment Details Patient Name: [name] Date of Service: 08/30/2016 10:45 AM Medical Record Patient Account Number: [id number] Number: Treating RN: Ahmed Prima 04-27-1919 (82 y.o. Other Clinician: Date of Birth/Sex: Female) Treating ROBSON, [place] Primary Care Physician: Lorelee Market Physician/Extender: G Referring Physician: Lorelee Market Weeks in Treatment: 20 Vascular Assessment Pulses: Posterior Tibial Dorsalis Pedis Palpable: [Right:Yes] Extremity colors, hair growth, and conditions: Temperature of Extremity: [Left:Warm] [Right:Warm] Capillary Refill: [Left:< 3 seconds] [Right:< 3 seconds] Toe Nail Assessment Left: Right: Thick: No No Discolored: Yes Deformed: No No Improper Length and Hygiene: No No Electronic Signature(s) Signed: 08/30/2016 4:33:45 PM By: Alric Quan Entered By: Alric Quan on 08/30/2016 10:43:24 [name] ([id number]) -------------------------------------------------------------------------------- Multi Wound Chart Details Patient Name: [name] Date of Service: 08/30/2016 10:45 AM Medical Record Number: [id number] Patient Account Number: [id number] Date of Birth/Sex: [birth year] (82 y.o. Female) Treating RN: Ahmed Prima Primary Care Physician: Lorelee Market Other Clinician: Referring Physician: Lorelee Market Treating Physician/Extender: Cathie Olden in Treatment: 20 Vital Signs Height(in): 64 Pulse(bpm): 74 Weight(lbs): 158 Blood Pressure 126/58 (mmHg): Body Mass Index(BMI): 27 Temperature(F): 97.8 Respiratory Rate 18 (breaths/min): Photos: [1:No Photos] [2:No Photos] [4:No Photos] Wound Location: [1:Left Malleolus] [2:Left Toe Fifth - Lateral] [4:Right Foot - Lateral] Wounding Event: [1:Gradually Appeared] [2:Gradually Appeared] [4:Gradually Appeared] Primary Etiology: [1:Arterial Insufficiency Ulcer Arterial Insufficiency Ulcer Arterial Insufficiency Ulcer] Comorbid History: [1:Cataracts, Hypertension, Cataracts, Hypertension, Cataracts, Hypertension, Osteoarthritis] [2:Osteoarthritis] [4:Osteoarthritis] Date Acquired: [1:04/07/2015] [2:04/07/2015] [4:07/26/2016] Weeks of Treatment: [1:20] [2:20] [4:4] Wound Status: [1:Open] [2:Open] [4:Open] Measurements L x W x D 0x0x0 [2:0.1x0.1x0.1] [4:0.3x0.3x0.2] (cm) Area (cm) : [1:0] [2:0.008] [4:0.071] Volume (cm) : [1:0] [2:0.001] [4:0.014] % Reduction in Area: [1:100.00%] [2:83.00%] [4:-51.10%] % Reduction in Volume: 100.00% [2:88.90%] [4:-180.00%]  Classification: [1:Partial Thickness] [2:Partial Thickness] [4:Partial Thickness] Exudate Amount: [1:None Present] [2:Medium] [4:Large] Exudate Type: [1:N/A] [2:Serous] [4:Serous] Exudate Color: [1:N/A] [2:amber] [4:amber] Wound Margin: [1:Thickened] [2:Thickened] [4:Distinct, outline attached] Granulation Amount: [1:None Present (0%)] [2:None Present (0%)] [4:None Present (0%)] Necrotic Amount: [1:None Present (0%)] [2:Large (67-100%)] [4:Large (67-100%)] Exposed Structures: [1:Fascia: No Fat: No Tendon: No Muscle: No Joint: No Bone: No Limited to Skin Breakdown] [2:Fascia: No Fat: No Tendon: No Muscle: No Joint: No Bone: No Limited to Skin Breakdown] [4:Fascia: No Fat: No Tendon: No Muscle: No Joint: No Bone: No Limited to  Skin Breakdown] Epithelialization: Large  (67-100%) None None Periwound Skin Texture: Edema: No Edema: Yes No Abnormalities Noted Excoriation: No Induration: No Callus: No Crepitus: No Fluctuance: No Friable: No Rash: No Scarring: No Periwound Skin Maceration: No Maceration: Yes Moist: Yes Moisture: Moist: No Moist: Yes Dry/Scaly: No Periwound Skin Color: Erythema: Yes Mottled: Yes No Abnormalities Noted Atrophie Blanche: No Cyanosis: No Ecchymosis: No Erythema: No Hemosiderin Staining: No Pallor: No Rubor: No Erythema Location: Circumferential N/A N/A Temperature: No Abnormality No Abnormality No Abnormality Tenderness on Yes Yes Yes Palpation: Wound Preparation: Ulcer Cleansing: Ulcer Cleansing: Ulcer Cleansing: Rinsed/Irrigated with Rinsed/Irrigated with Rinsed/Irrigated with Saline Saline Saline Topical Anesthetic Topical Anesthetic Topical Anesthetic Applied: None Applied: Other: lidocaine Applied: Other: lidocaine 4% 4% Treatment Notes Electronic Signature(s) Signed: 08/30/2016 4:33:45 PM By: Alric Quan Entered By: Alric Quan on 08/30/2016 10:51:55 [name] ([id number]) -------------------------------------------------------------------------------- [place] Details Patient Name: [name] Date of Service: 08/30/2016 10:45 AM Medical Record Number: [id number] Patient Account Number: [id number] Date of Birth/Sex: [birth year] (82 y.o. Female) Treating RN: Ahmed Prima Primary Care Physician: Lorelee Market Other Clinician: Referring Physician: Lorelee Market Treating Physician/Extender: Cathie Olden in Treatment: 66 Active Inactive Abuse / Safety / Falls / Self Care Management Nursing Diagnoses: Potential for falls Goals: Patient will remain injury free Date Initiated: 04/06/2016 Goal Status: Active Interventions: Assess fall risk on admission and as needed Notes: Nutrition Nursing Diagnoses: Imbalanced  nutrition Goals: Patient/caregiver agrees to and verbalizes understanding of need to use nutritional supplements and/or vitamins as prescribed Date Initiated: 04/06/2016 Goal Status: Active Interventions: Assess patient nutrition upon admission and as needed per policy Notes: Orientation to the Wound Care Program Nursing Diagnoses: Knowledge deficit related to the wound healing center program Goals: Patient/caregiver will verbalize understanding of the [facility] [place] ([id number]) Date Initiated: 04/06/2016 Goal Status: Active Interventions: Provide education on orientation to the wound center Notes: Pain, Acute or Chronic Nursing Diagnoses: Pain, acute or chronic: actual or potential Potential alteration in comfort, pain Goals: Patient will verbalize adequate pain control and receive pain control interventions during procedures as needed Date Initiated: 04/06/2016 Goal Status: Active Interventions: Assess comfort goal upon admission Complete pain assessment as per visit requirements Notes: Wound/Skin Impairment Nursing Diagnoses: Impaired tissue integrity Goals: Ulcer/skin breakdown will have a volume reduction of 30% by week 4 Date Initiated: 04/06/2016 Goal Status: Active Ulcer/skin breakdown will have a volume reduction of 50% by week 8 Date Initiated: 04/06/2016 Goal Status: Active Ulcer/skin breakdown will have a volume reduction of 80% by week 12 Date Initiated: 04/06/2016 Goal Status: Active Interventions: Assess patient/caregiver ability to obtain necessary supplies Assess ulceration(s) every visit Notes: [name], [name] ([id number]) Electronic Signature(s) Signed: 08/30/2016 4:33:45 PM By: Alric Quan Entered By: Alric Quan on 08/30/2016 10:51:41 [name] ([id number]) -------------------------------------------------------------------------------- Pain Assessment Details Patient Name: [name] Date of  Service: 08/30/2016 10:45 AM Medical Record Patient Account Number: [id number] Number: Treating  RN: Carolyne Fiscal, Debi Jul 11, 1919 (82 y.o. Other Clinician: Date of Birth/Sex: Female) Treating ROBSON, [place] Primary Care Physician: Lorelee Market Physician/Extender: G Referring Physician: Lorelee Market Weeks in Treatment: 20 Active Problems Location of Pain Severity and Description of Pain Patient Has Paino No Site Locations With Dressing Change: No Pain Management and Medication Current Pain Management: Electronic Signature(s) Signed: 08/30/2016 4:33:45 PM By: Alric Quan Entered By: Alric Quan on 08/30/2016 10:37:40 [name] ([id number]) -------------------------------------------------------------------------------- Patient/Caregiver Education Details Patient Name: [name] Date of Service: 08/30/2016 10:45 AM Medical Record Number: [id number] Patient Account Number: [id number] Date of Birth/Gender: [birth year] (82 y.o. Female) Treating RN: Ahmed Prima Primary Care Physician: Lorelee Market Other Clinician: Referring Physician: Lorelee Market Treating Physician/Extender: Cathie Olden in Treatment: 20 Education Assessment Education Provided To: Patient Education Topics Provided Wound/Skin Impairment: Handouts: Other: change dressing as ordered Methods: Demonstration, Explain/Verbal Responses: State content correctly Electronic Signature(s) Signed: 08/30/2016 4:33:45 PM By: Alric Quan Entered By: Alric Quan on 08/30/2016 10:57:20 [name] ([id number]) -------------------------------------------------------------------------------- Wound Assessment Details Patient Name: [name] Date of Service: 08/30/2016 10:45 AM Medical Record Patient Account Number: [id number] Number: Treating RN: Ahmed Prima 1919-03-18 (82 y.o. Other Clinician: Date of Birth/Sex: Female)  Treating ROBSON, [place] Primary Care Physician: Lorelee Market Physician/Extender: G Referring Physician: Lorelee Market Weeks in Treatment: 20 Wound Status Wound Number: 1 Primary Arterial Insufficiency Ulcer Etiology: Wound Location: Left Malleolus Wound Status: Open Wounding Event: Gradually Appeared Comorbid Cataracts, Hypertension, Date Acquired: 04/07/2015 History: Osteoarthritis Weeks Of Treatment: 20 Clustered Wound: No Photos Photo Uploaded By: Alric Quan on 08/30/2016 11:40:57 Wound Measurements Length: (cm) 0 % Reduction in Width: (cm) 0 % Reduction in Depth: (cm) 0 Epithelializati Area: (cm) 0 Tunneling: Volume: (cm) 0 Undermining: Area: 100% Volume: 100% on: Large (67-100%) No No Wound Description Classification: Partial Thickness Foul Odor Afte Wound Margin: Thickened Exudate Amount: None Present r Cleansing: No Wound Bed Granulation Amount: None Present (0%) Exposed Structure Necrotic Amount: None Present (0%) Fascia Exposed: No Fat Layer Exposed: No Tendon Exposed: No Muscle Exposed: No Joint Exposed: No Bone Exposed: No [name], [name] ([id number]) Limited to Skin Breakdown Periwound Skin Texture Texture Color No Abnormalities Noted: No No Abnormalities Noted: No Localized Edema: No Erythema: Yes Erythema Location: Circumferential Moisture No Abnormalities Noted: No Temperature / Pain Maceration: No Temperature: No Abnormality Moist: No Tenderness on Palpation: Yes Wound Preparation Ulcer Cleansing: Rinsed/Irrigated with Saline Topical Anesthetic Applied: None Electronic Signature(s) Signed: 08/30/2016 4:33:45 PM By: Alric Quan Entered By: Alric Quan on 08/30/2016 10:48:37 [name] ([id number]) -------------------------------------------------------------------------------- Wound Assessment Details Patient Name: [name] Date of Service: 08/30/2016 10:45 AM Medical Record Number:  [id number] Patient Account Number: [id number] Date of Birth/Sex: April 09, 1919 (82 y.o. Female) Treating RN: Carolyne Fiscal, De[place] Primary Care Physician: Lorelee Market Other Clinician: Referring Physician: Lorelee Market Treating Physician/Extender: Lawanda Cousins Weeks in Treatment: 20 Wound Status Wound Number: 2 Primary Arterial Insufficiency Ulcer Etiology: Wound Location: Left Toe Fifth - Lateral Wound Status: Open Wounding Event: Gradually Appeared Comorbid Cataracts, Hypertension, Date Acquired: 04/07/2015 History: Osteoarthritis Weeks Of Treatment: 20 Clustered Wound: No Photos Photo Uploaded By: Alric Quan on 08/30/2016 11:40:57 Wound Measurements Length: (cm) 0.1 Width: (cm) 0.1 Depth: (cm) 0.1 Area: (cm) 0.008 Volume: (cm) 0.001 % Reduction in Area: 83% % Reduction in Volume: 88.9% Epithelialization: None Tunneling: No Undermining: No Wound Description Classification: Partial Thickness Wound Margin: Thickened Exudate Amount: Medium Exudate Type: Serous Exudate Color: amber Foul Odor After Cleansing: No Wound Bed Granulation Amount: None Present (0%) Exposed Structure Necrotic Amount:  Large (67-100%) Fascia Exposed: No Necrotic Quality: Adherent Slough Fat Layer Exposed: No Tendon Exposed: No Muscle Exposed: No Joint Exposed: No Bone Exposed: No [name], [name] ([id number]) Limited to Skin Breakdown Periwound Skin Texture Texture Color No Abnormalities Noted: No No Abnormalities Noted: No Callus: No Atrophie Blanche: No Crepitus: No Cyanosis: No Excoriation: No Ecchymosis: No Fluctuance: No Erythema: No Friable: No Hemosiderin Staining: No Induration: No Mottled: Yes Localized Edema: Yes Pallor: No Rash: No Rubor: No Scarring: No Temperature / Pain Moisture Temperature: No Abnormality No Abnormalities Noted: No Tenderness on Palpation: Yes Dry / Scaly: No Maceration: Yes Moist: Yes Wound Preparation Ulcer Cleansing:  Rinsed/Irrigated with Saline Topical Anesthetic Applied: Other: lidocaine 4%, Treatment Notes Wound #2 (Left, Lateral Toe Fifth) 1. Cleansed with: Clean wound with Normal Saline 2. Anesthetic Topical Lidocaine 4% cream to wound bed prior to debridement 3. Peri-wound Care: Skin Prep 5. Secondary Dressing Applied Bordered Foam Dressing Dry Gauze Electronic Signature(s) Signed: 08/30/2016 4:33:45 PM By: Alric Quan Entered By: Alric Quan on 08/30/2016 10:51:24 [name] ([id number]) -------------------------------------------------------------------------------- Wound Assessment Details Patient Name: [name] Date of Service: 08/30/2016 10:45 AM Medical Record Number: [id number] Patient Account Number: [id number] Date of Birth/Sex: 05-01-1919 (82 y.o. Female) Treating RN: Carolyne Fiscal, Debi Primary Care Physician: Lorelee Market Other Clinician: Referring Physician: Lorelee Market Treating Physician/Extender: Lawanda Cousins Weeks in Treatment: 20 Wound Status Wound Number: 4 Primary Arterial Insufficiency Ulcer Etiology: Wound Location: Right Foot - Lateral Wound Status: Open Wounding Event: Gradually Appeared Comorbid Cataracts, Hypertension, Date Acquired: 07/26/2016 History: Osteoarthritis Weeks Of Treatment: 4 Clustered Wound: No Photos Photo Uploaded By: Alric Quan on 08/30/2016 11:41:11 Wound Measurements Length: (cm) 0.3 Width: (cm) 0.3 Depth: (cm) 0.2 Area: (cm) 0.071 Volume: (cm) 0.014 % Reduction in Area: -51.1% % Reduction in Volume: -180% Epithelialization: None Tunneling: No Undermining: No Wound Description Classification: Partial Thickness Wound Margin: Distinct, outline attached Exudate Amount: Large Exudate Type: Serous Exudate Color: amber Foul Odor After Cleansing: No Wound Bed Granulation Amount: None Present (0%) Exposed Structure Necrotic Amount: Large (67-100%) Fascia Exposed: No Necrotic  Quality: Adherent Slough Fat Layer Exposed: No Tendon Exposed: No Muscle Exposed: No Joint Exposed: No Bone Exposed: No [name], [name] ([id number]) Limited to Skin Breakdown Periwound Skin Texture Texture Color No Abnormalities Noted: No No Abnormalities Noted: No Moisture Temperature / Pain No Abnormalities Noted: No Temperature: No Abnormality Moist: Yes Tenderness on Palpation: Yes Wound Preparation Ulcer Cleansing: Rinsed/Irrigated with Saline Topical Anesthetic Applied: Other: lidocaine 4%, Treatment Notes Wound #4 (Right, Lateral Foot) 1. Cleansed with: Clean wound with Normal Saline 2. Anesthetic Topical Lidocaine 4% cream to wound bed prior to debridement 3. Peri-wound Care: Skin Prep 4. Dressing Applied: Prisma Ag 5. Secondary Dressing Applied Bordered Foam Dressing Dry Gauze Electronic Signature(s) Signed: 08/30/2016 4:33:45 PM By: Alric Quan Entered By: Alric Quan on 08/30/2016 10:50:50 [name] ([id number]) -------------------------------------------------------------------------------- Vitals Details Patient Name: [name] Date of Service: 08/30/2016 10:45 AM Medical Record Patient Account Number: [id number] Number: Treating RN: Ahmed Prima [birth year] (82 y.o. Other Clinician: Date of Birth/Sex: Female) Treating ROBSON, [place] Primary Care Physician: Lorelee Market Physician/Extender: G Referring Physician: Lorelee Market Weeks in Treatment: 20 Vital Signs Time Taken: 10:41 Temperature (F): 97.8 Height (in): 64 Pulse (bpm): 74 Weight (lbs): 158 Respiratory Rate (breaths/min): 18 Body Mass Index (BMI): 27.1 Blood Pressure (mmHg): 126/58 Reference Range: 80 - 120 mg / dl Electronic Signature(s) Signed: 08/30/2016 4:33:45 PM By: Alric Quan Entered By: Alric Quan on 08/30/2016 10:42:52

## 2016-09-06 NOTE — Progress Notes (Signed)
[name], [name] ([id number]) Visit Report for 08/30/2016 Chief Complaint Document Details Patient Name: [name], [name] 08/30/2016 10:45 Date of Service: AM Medical Record [id number] Number: Patient Account Number: [id number] 1919-08-28 (82 y.o. Treating RN: Ahmed Prima Date of Birth/Sex: Female) Other Clinician: Primary Care Physician: Lorelee Market Treating Laniah Grimm, [place] Referring Physician: Lorelee Market Physician/Extender: Suella Grove in Treatment: 20 Information Obtained from: Patient Chief Complaint here for follow-up on right lateral foot ulcer and left foot ulcers Electronic Signature(s) Signed: 08/30/2016 12:44:36 PM By: Rene Kocher, NP, [place] Yarbro Entered By: Rene Kocher, NP, Deral Schellenberg on 08/30/2016 12:44:35 [name] ([id number]) -------------------------------------------------------------------------------- Debridement Details Patient Name: [name], [name] 08/30/2016 10:45 Date of Service: AM Medical Record [id number] Number: Patient Account Number: [id number] 08-23-1919 (82 y.o. Treating RN: Ahmed Prima Date of Birth/Sex: Female) Other Clinician: Primary Care Physician: Lorelee Market Treating Hanalei Glace, [place] Referring Physician: Lorelee Market Physician/Extender: Suella Grove in Treatment: 20 Debridement Performed for Wound #4 Right,Lateral Foot Assessment: Performed By: Physician Lawanda Cousins, NP Debridement: Open Wound/Selective Debridement Selective Description: Pre-procedure Yes - 11:15 Verification/Time Out Taken: Start Time: 11:16 Pain Control: Lidocaine 4% Topical Solution Total Area Debrided (L x 0.3 (cm) x 0.3 (cm) = 0.09 (cm) W): Tissue and other Viable, Non-Viable, Exudate, Fibrin/Slough, Subcutaneous material debrided: Instrument: Curette Bleeding: Minimum Hemostasis Achieved: Pressure End Time: 11:17 Procedural Pain: 0 Post Procedural Pain: 0 Response to Treatment: Procedure was tolerated well Post Debridement Measurements of  Total Wound Length: (cm) 0.3 Width: (cm) 0.3 Depth: (cm) 0.2 Volume: (cm) 0.014 Character of Wound/Ulcer Post Requires Further Debridement Debridement: Severity of Tissue Post Debridement: Fat layer exposed Post Procedure Diagnosis Same as Pre-procedure Electronic Signature(s) Signed: 08/30/2016 12:44:12 PM By: Rene Kocher, NP, Evelena Asa, [place]ail[place] ([id number]) Signed: 08/30/2016 4:33:45 PM By: Alric Quan Entered By: Rene Kocher, NP, Octavius Shin on 08/30/2016 12:44:12 [name] ([id number]) -------------------------------------------------------------------------------- HPI Details Patient Name: [name], [name] 08/30/2016 10:45 Date of Service: AM Medical Record [id number] Number: Patient Account Number: [id number] 07/22/1919 (82 y.o. Treating RN: Ahmed Prima Date of Birth/Sex: Female) Other Clinician: Primary Care Physician: Lorelee Market Treating Terica Yogi Referring Physician: Lorelee Market Physician/Extender: Suella Grove in Treatment: 20 History of Present Illness HPI Description: 04/06/16; this is a 82 year old woman who arrives accompanied by 2 daughters for a wound on her left ankle and her left fifth toe. These have apparently been present for a year. I'm not quite certain how she came to this clinic however she was being followed by Sharlotte Alamo her podiatrist for these wounds. She was also referred to [facility] and they apparently did a test presumably arterial studies although we don't have any of these results and we couldn't get through to the office today. The family but has been applying a combination of Bactroban and a light bandage and perhaps more recently Silvadene cream. She did have an x-ray of the foot roughly 6 months ago at the podiatry office the family was unaware that if there were any abnormalities. Apparently they have not seen any healing here. Our intake nurse noted a slight skin tear on the right anterior lower leg.  Nobody seemed aware of this. ABIs calculated in this clinic was 0.3 on the right and 0.4 on the left I have reviewed things [facility]. There is very little information on this patient. She apparently follows in current total clinic which we don't have information from. She has mentioned already been to a [facility]. She has a history of hypothyroidism, nephrolithiasis arthritis and has had a previous mastectomy. 04/13/16; patient's x-ray was normal. She is already been  to see Dr. Delana Meyer vascular surgery. Her arterial exam was from November 2016 this showed a left ABI of 0.62 her right of 0.76. Her duplex ultrasound of the left leg showed biphasic waves in the common femoral and distal femoral artery however monophasic waves in the superficial femoral artery proximal and mid biphasic it distal. 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 The patient tells me that she has pain at night when she tries to lie down which is improved by getting up and sitting in the chair this sounds like claudication at rest. Her wounds are on the left medial malleolus and the dorsal fifth toe small punched out wounds that are right on bone. We use Santyl last week 04/20/16: nurse informed me pt has declined evaluation for significant PAD. she denies systemic s/s of infections. 04/27/16;; the patient has had noninvasive arterial studies done in November 2016. ABI and the left was 0.62. Monophasic waves at the superficial femoral artery. Occluded to the posterior tibial artery. So had greater than 50% stenosis of the right superficial femoral and greater than 50% stenosis of the left superficial femoral artery. She had bilateral tibial peroneal artery disease. Both of the wounds on the left fifth toe and left lateral malleolus have been present for more than a year. They have been to see vein and vascular. The patient has some pain but miraculously I think the wounds have largely been stable. No evidence of  infection 05/04/16; she goes for a noninvasive study tomorrow and then sees Dr. Fletcher Anon on Monday. By the time she is here next week we should have a better picture of whether something can be done with regards to her arterial flow. We continue to have ischemic-looking wounds on the left fifth toe and left lateral malleolus. 05/10/16; the patient went for her arterial studies and saw Dr. Fletcher Anon. As predicted she is felt to have critical limb ischemia. The feeling is that she has occlusion of the SFA. The feeling would she would be a Cottrill, Tahesha ([id number]) candidate for a stent to the SFA. The patient did not make a decision to proceed with the procedure and she is here with family members to discuss this me today. He shouldn't has a lot of pain and cannot sleep and rest well at night per her family. 05/24/16; the patient went and had a complex revascularization/angioplasty of the left superficial femoral artery followed by drug-coated balloon angioplasty and spots stenting. She tolerated the procedure well. She was recommended for dual antiplatelet drugs with Plavix and aspirin for at least a month. She went yesterday for I believe follow-up serial Dopplers and ABIs although I don't see these results. The patient is unfortunately complaining of a lot of pain in her bilateral lower legs below the knees from the ankle to the knees. She apparently was prescribed lidocaine and apparently put this on her legs instead of over the wound areas. This may have something to do with it however there is a lot of edema in her bilateral legs I was able to find her arterial studies from yesterday. The left ABI has improved up to 0.66 post left SFA stent. The bilateral great toe indices remain abnormal with the left being in the 0.25 range. Duplex ultrasound showed her left SFA stent is patent monophasic waveforms persist in the left leg 06/07/16; continued punched-out areas over the dorsal left fifth toe and left  lateral malleolus. No major improvement 06/28/16; the areas over her dorsal left fifth toe and left lateral  malleolus are covered in surface slough we are using Iodoflex 07/05/16. We have been using Iodoflex for 2-3 weeks now. I have not been debridement is because of pain 07/19/16 currently we have been using Iodoflex for roughly the past month. Previously we were unable to debride due to pain although today patient states that the pain is not nearly as severe as it has been in the past. In fact she rates this to be a 1 out of 10 and it worse to a to 10 with palpation of the wound. All and all her and her daughter feel like this is actually doing steadily better at this point in time. She is pleased with progress and we have been seeing her every 2 weeks. 08/02/16; this is a delightful 82 year old woman I have not seen in over a month. She has arterial insufficiency wounds remaining over the left lateral malleolus and the left fifth toe. With the help of Dr. Fletcher Anon we are able to get her revascularized on the left. The 2 wounds on the left have been making good progress and are definitely smaller especially the area over the left lateral malleolus. She is certainly in a lot less pain than she used to be although I still think there is some claudication type pain. Unfortunately she is developed a area on the right lateral foot which is a small open area but I think is threatened. I suspect a small ischemic areas well. Also on her right fifth toe there is an area that is not open but looks as though it is receiving too much pressure from footwear. Finally an area over her right malleolus although I don't think this is on its way to anything ominous 08/17/16 patient presents today for follow up evaluation she tells me that she is really doing fairly well from a pain standpoint compared to where she has been previous. She is tolerating the dressing changes without any complication. She continues to  have discharge and drainage however. 08-30-16 Ms. Rigor presents today with her daughter, she states that she continues to have intermittent shooting pains to the left lateral fifth toe at this site of seems to be a healed ulcer. She denies any other issues that her wound related since her last appointment. Her daughter states that she is in need of a tramadol refill at this time as she uses half a tablet at at bedtime to aid in sleep due to foot pain. Iodoflex has been used on all wounds in previous dressing changes. Electronic Signature(s) Signed: 08/30/2016 12:46:45 PM By: Rene Kocher, NP, Jana Hakim By: Rene Kocher, NP, Kru Allman on 08/30/2016 12:46:44 [name] ([id number]) -------------------------------------------------------------------------------- Physical Exam Details Patient Name: KANEESHA, BEDDOE 08/30/2016 10:45 Date of Service: AM Medical Record [id number] Number: Patient Account Number: [id number] 13-Sep-1919 (82 y.o. Treating RN: Ahmed Prima Date of Birth/Sex: Female) Other Clinician: Primary Care Physician: Lorelee Market Treating Rami Budhu Referring Physician: Lorelee Market Physician/Extender: Suella Grove in Treatment: 20 Constitutional BP within normal limits. afebrile. well nourished; well developed; appears stated age;Marland Kitchen Respiratory non-labored respiratory effort. Cardiovascular non-palpable DP and PT. erythema, foot is cool to touch, toes are cold to touch with delayed cap refill (this appears unchanged from previous examinations). Musculoskeletal ambulates with cane, transfers and wheelchair as well, has been wearing offloading surgical shoes to the left foot. Integumentary (Hair, Skin) the left lateral foot wound is completely epithelialized and declared healed today; the left lateral fifth toe ulceration is covered with dry crust and in an attempt to remove the crest  to evaluate wound there is no evidence of ulceration at this time; ulceration to  right lateral foot remains with evidence of granular buds throughout the wound bed there is small amount of drainage coming from that wound. no induration, no fluctuance, denies pain to right lateral foot wound; continues to complain of intermittent shooting pains to left fifth toe. Psychiatric oriented to person place and time. calm, pleasant, conversive. Electronic Signature(s) Signed: 08/30/2016 12:50:45 PM By: Rene Kocher, NP, Jana Hakim By: Rene Kocher, NP, Zareya Tuckett on 08/30/2016 12:50:45 [name] ([id number]) -------------------------------------------------------------------------------- Physician Orders Details Patient Name: NICK, FRONHEISER 08/30/2016 10:45 Date of Service: AM Medical Record [id number] Number: Patient Account Number: [id number] Feb 01, 1919 (82 y.o. Treating RN: Ahmed Prima Date of Birth/Sex: Female) Other Clinician: Primary Care Physician: Lorelee Market Treating Lowella Kindley, [place] Referring Physician: Lorelee Market Physician/Extender: Suella Grove in Treatment: 20 Verbal / Phone Orders: Yes Clinician: Carolyne Fiscal, Debi Read Back and Verified: Yes Diagnosis Coding Wound Cleansing Wound #2 Left,Lateral Toe Fifth o Clean wound with Normal Saline. - for clinic use o Cleanse wound with mild soap and water o May Shower, gently pat wound dry prior to applying new dressing. Wound #4 Right,Lateral Foot o Clean wound with Normal Saline. - for clinic use o Cleanse wound with mild soap and water o May Shower, gently pat wound dry prior to applying new dressing. Anesthetic Wound #2 Left,Lateral Toe Fifth o Topical Lidocaine 4% cream applied to wound bed prior to debridement - for clinic use Wound #4 Right,Lateral Foot o Topical Lidocaine 4% cream applied to wound bed prior to debridement - for clinic use Skin Barriers/Peri-Wound Care o Skin Prep Primary Wound Dressing Wound #4 Right,Lateral Foot o Prisma Ag Secondary Dressing Wound #2  Left,Lateral Toe Fifth o Dry Gauze o Boardered Foam Dressing Wound #4 Right,Lateral Foot o Dry Gauze o Boardered Foam Dressing Dressing Change Frequency Mayol, Arantza ([id number]) Wound #2 Left,Lateral Toe Fifth o Change dressing every other day. Wound #4 Right,Lateral Foot o Change dressing every other day. Follow-up Appointments Wound #2 Left,Lateral Toe Fifth o Return Appointment in 2 weeks. Wound #4 Right,Lateral Foot o Return Appointment in 2 weeks. Edema Control Wound #2 Left,Lateral Toe Fifth o Elevate legs to the level of the heart and pump ankles as often as possible o Other: - compression stockings Wound #4 Right,Lateral Foot o Elevate legs to the level of the heart and pump ankles as often as possible o Other: - compression stockings Additional Orders / Instructions Wound #2 Left,Lateral Toe Fifth o Increase protein intake. Wound #4 Right,Lateral Foot o Increase protein intake. Medications-please add to medication list. Wound #2 Left,Lateral Toe Fifth o Other: - Vitamin C, Zinc, Multivitamins Tramadol Wound #4 Right,Lateral Foot o Other: - Vitamin C, Zinc, Multivitamins Tramadol Electronic Signature(s) Signed: 08/30/2016 4:33:45 PM By: Alric Quan Signed: 09/06/2016 9:25:32 AM By: Rene Kocher, NP, Simi Briel Entered By: Alric Quan on 08/30/2016 11:19:48 [name] ([id number]) -------------------------------------------------------------------------------- Problem List Details Patient Name: JAQUALA, WHOOLERY 08/30/2016 10:45 Date of Service: AM Medical Record [id number] Number: Patient Account Number: [id number] 1919/01/27 (82 y.o. 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 04/06/2016 Yes of other part of foot I70.243 Atherosclerosis of native arteries of left leg with ulceration 04/06/2016  Yes of ankle L97.512 Non-pressure chronic ulcer of other part of right foot with 08/30/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 Treating RN: Ahmed Prima Date of Birth/Sex: Female) Other Clinician: Primary Care Physician: Lorelee Market Treating Jniyah Dantuono Referring Physician: Lorelee Market Physician/Extender: Suella Grove in Treatment: 20 Subjective Chief Complaint Information obtained from Patient here for follow-up on right lateral foot ulcer and left foot ulcers History of Present Illness (HPI) 04/06/16; this is a 82 year old woman who arrives accompanied by 2 daughters for a wound on her left ankle and her left fifth toe. These have apparently been present for a year. I'm not quite certain how she came to this clinic however she was being followed by Sharlotte Alamo her podiatrist for these wounds. She was also referred to [facility] and they apparently did a test presumably arterial studies although we don't have any of these results and we couldn't get through to the office today. The family but has been applying a combination of Bactroban and a  light bandage and perhaps more recently Silvadene cream. She did have an x-ray of the foot roughly 6 months ago at the podiatry office the family was unaware that if there were any abnormalities. Apparently they have not seen any healing here. Our intake nurse noted a slight skin tear on the right anterior lower leg. Nobody seemed aware of this. ABIs calculated in this clinic was 0.3 on the right and 0.4 on the left I have reviewed things [facility]. There is very little information on this patient. She apparently follows in current total clinic which we don't have information from. She has mentioned already been to a [facility]. She has a history of hypothyroidism, nephrolithiasis arthritis and has had a previous mastectomy. 04/13/16; patient's x-ray was normal. She is already been to see Dr. Delana Meyer vascular surgery. Her arterial exam was from November 2016 this showed a left ABI of 0.62 her right of 0.76. Her duplex ultrasound of the left leg showed biphasic waves in the common femoral and distal femoral artery however monophasic waves in the superficial femoral artery proximal and mid biphasic it distal. 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 The patient tells me that she has pain at night when she tries to lie down which is improved by getting up and sitting in the chair this sounds like claudication at rest. Her wounds are on the left medial malleolus and the dorsal fifth toe small punched out wounds that are right on bone. We use Santyl last week 04/20/16: nurse informed me pt has declined evaluation for significant PAD. she denies systemic s/s of infections. 04/27/16;; the patient has had noninvasive arterial studies done in November 2016. ABI and the left was 0.62. Monophasic waves at the superficial femoral artery. Occluded to the posterior tibial artery. So had greater than 50% stenosis of the right superficial femoral and greater than 50% stenosis of the left superficial femoral  artery. She had bilateral tibial peroneal artery disease. Both of the wounds on the left fifth toe and left lateral malleolus have been present for more than a year. They have been to see vein and vascular. The patient has  some pain but miraculously I think the wounds have largely been stable. No Stash, Sahasra ([id number]) evidence of infection 05/04/16; she goes for a noninvasive study tomorrow and then sees Dr. Fletcher Anon on Monday. By the time she is here next week we should have a better picture of whether something can be done with regards to her arterial flow. We continue to have ischemic-looking wounds on the left fifth toe and left lateral malleolus. 05/10/16; the patient went for her arterial studies and saw Dr. Fletcher Anon. As predicted she is felt to have critical limb ischemia. The feeling is that she has occlusion of the SFA. The feeling would she would be a candidate for a stent to the SFA. The patient did not make a decision to proceed with the procedure and she is here with family members to discuss this me today. He shouldn't has a lot of pain and cannot sleep and rest well at night per her family. 05/24/16; the patient went and had a complex revascularization/angioplasty of the left superficial femoral artery followed by drug-coated balloon angioplasty and spots stenting. She tolerated the procedure well. She was recommended for dual antiplatelet drugs with Plavix and aspirin for at least a month. She went yesterday for I believe follow-up serial Dopplers and ABIs although I don't see these results. The patient is unfortunately complaining of a lot of pain in her bilateral lower legs below the knees from the ankle to the knees. She apparently was prescribed lidocaine and apparently put this on her legs instead of over the wound areas. This may have something to do with it however there is a lot of edema in her bilateral legs I was able to find her arterial studies from yesterday. The left  ABI has improved up to 0.66 post left SFA stent. The bilateral great toe indices remain abnormal with the left being in the 0.25 range. Duplex ultrasound showed her left SFA stent is patent monophasic waveforms persist in the left leg 06/07/16; continued punched-out areas over the dorsal left fifth toe and left lateral malleolus. No major improvement 06/28/16; the areas over her dorsal left fifth toe and left lateral malleolus are covered in surface slough we are using Iodoflex 07/05/16. We have been using Iodoflex for 2-3 weeks now. I have not been debridement is because of pain 07/19/16 currently we have been using Iodoflex for roughly the past month. Previously we were unable to debride due to pain although today patient states that the pain is not nearly as severe as it has been in the past. In fact she rates this to be a 1 out of 10 and it worse to a to 10 with palpation of the wound. All and all her and her daughter feel like this is actually doing steadily better at this point in time. She is pleased with progress and we have been seeing her every 2 weeks. 08/02/16; this is a delightful 82 year old woman I have not seen in over a month. She has arterial insufficiency wounds remaining over the left lateral malleolus and the left fifth toe. With the help of Dr. Fletcher Anon we are able to get her revascularized on the left. The 2 wounds on the left have been making good progress and are definitely smaller especially the area over the left lateral malleolus. She is certainly in a lot less pain than she used to be although I still think there is some claudication type pain. Unfortunately she is developed a area on the right lateral foot which  is a small open area but I think is threatened. I suspect a small ischemic areas well. Also on her right fifth toe there is an area that is not open but looks as though it is receiving too much pressure from footwear. Finally an area over her right  malleolus although I don't think this is on its way to anything ominous 08/17/16 patient presents today for follow up evaluation she tells me that she is really doing fairly well from a pain standpoint compared to where she has been previous. She is tolerating the dressing changes without any complication. She continues to have discharge and drainage however. 08-30-16 Ms. [name] presents today with her daughter, she states that she continues to have intermittent shooting pains to the left lateral fifth toe at this site of seems to be a healed ulcer. She denies any other issues that her wound related since her last appointment. Her daughter states that she is in need of a tramadol refill at this time as she uses half a tablet at at bedtime to aid in sleep due to foot pain. Iodoflex has been used on all wounds in previous dressing changes. [name], [name] ([id number]) Objective Constitutional BP within normal limits. afebrile. well nourished; well developed; appears stated age;Marland Kitchen Vitals Time Taken: 10:41 AM, Height: 64 in, Weight: 158 lbs, BMI: 27.1, Temperature: 97.8 F, Pulse: 74 bpm, Respiratory Rate: 18 breaths/min, Blood Pressure: 126/58 mmHg. Respiratory non-labored respiratory effort. Cardiovascular non-palpable DP and PT. erythema, foot is cool to touch, toes are cold to touch with delayed cap refill (this appears unchanged from previous examinations). Musculoskeletal ambulates with cane, transfers and wheelchair as well, has been wearing offloading surgical shoes to the left foot. Psychiatric oriented to person place and time. calm, pleasant, conversive. Integumentary (Hair, Skin) the left lateral foot wound is completely epithelialized and declared healed today; the left lateral fifth toe ulceration is covered with dry crust and in an attempt to remove the crest to evaluate wound there is no evidence of ulceration at this time; ulceration to right lateral foot remains with  evidence of granular buds throughout the wound bed there is small amount of drainage coming from that wound. no induration, no fluctuance, denies pain to right lateral foot wound; continues to complain of intermittent shooting pains to left fifth toe. Wound #1 status is Open. Original cause of wound was Gradually Appeared. The wound is located on the Left Malleolus. The wound measures 0cm length x 0cm width x 0cm depth; 0cm^2 area and 0cm^3 volume. The wound is limited to skin breakdown. There is no tunneling or undermining noted. There is a none present amount of drainage noted. The wound margin is thickened. There is no granulation within the wound bed. There is no necrotic tissue within the wound bed. The periwound skin appearance exhibited: Erythema. The periwound skin appearance did not exhibit: Localized Edema, Maceration, Moist. The surrounding wound skin color is noted with erythema which is circumferential. Periwound temperature was noted as No Abnormality. The periwound has tenderness on palpation. Wound #2 status is Open. Original cause of wound was Gradually Appeared. The wound is located on the Left,Lateral Toe Fifth. The wound measures 0.1cm length x 0.1cm width x 0.1cm depth; 0.008cm^2 area [name], [name] ([id number]) and 0.001cm^3 volume. The wound is limited to skin breakdown. There is no tunneling or undermining noted. There is a medium amount of serous drainage noted. The wound margin is thickened. There is no granulation within the wound bed. There is a large (67-100%)  amount of necrotic tissue within the wound bed including Adherent Slough. The periwound skin appearance exhibited: Localized Edema, Maceration, Moist, Mottled. The periwound skin appearance did not exhibit: Callus, Crepitus, Excoriation, Fluctuance, Friable, Induration, Rash, Scarring, Dry/Scaly, Atrophie Blanche, Cyanosis, Ecchymosis, Hemosiderin Staining, Pallor, Rubor, Erythema. Periwound temperature was  noted as No Abnormality. The periwound has tenderness on palpation. Wound #4 status is Open. Original cause of wound was Gradually Appeared. The wound is located on the Right,Lateral Foot. The wound measures 0.3cm length x 0.3cm width x 0.2cm depth; 0.071cm^2 area and 0.014cm^3 volume. The wound is limited to skin breakdown. There is no tunneling or undermining noted. There is a large amount of serous drainage noted. The wound margin is distinct with the outline attached to the wound base. There is no granulation within the wound bed. There is a large (67-100%) amount of necrotic tissue within the wound bed including Adherent Slough. The periwound skin appearance exhibited: Moist. Periwound temperature was noted as No Abnormality. The periwound has tenderness on palpation. 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 There was an Open Wound debridement with total area of 0.09 sq cm performed by Lawanda Cousins, NP. with the following instrument(s): Curette to remove Viable and Non-Viable tissue/material including Exudate, Fibrin/Slough, and Subcutaneous after achieving pain control using Lidocaine 4% Topical Solution. A time out was conducted at 11:15, prior to the start of the procedure. A Minimum amount of bleeding was controlled with Pressure. The procedure was tolerated well with a pain level of 0 throughout and a pain level of 0 following the procedure. Post Debridement Measurements: 0.3cm length x 0.3cm width x 0.2cm depth; 0.014cm^3 volume. Character  of Wound/Ulcer Post Debridement requires further debridement. Severity of Tissue Post Debridement is: Fat layer exposed. Post procedure Diagnosis Wound #4: Same as Pre-Procedure [name], [name] ([id number]) Plan Wound Cleansing: Wound #2 Left,Lateral Toe Fifth: Clean wound with Normal Saline. - for clinic use Cleanse wound with mild soap and water May Shower, gently pat wound dry prior to applying new dressing. Wound #4 Right,Lateral Foot: Clean wound with Normal Saline. - for clinic use Cleanse wound with mild soap and water May Shower, gently pat wound dry prior to applying new dressing. Anesthetic: Wound #2 Left,Lateral Toe Fifth: Topical Lidocaine 4% cream applied to wound bed prior to debridement - for clinic use Wound #4 Right,Lateral Foot: Topical Lidocaine 4% cream applied to wound bed prior to debridement - for clinic use Skin Barriers/Peri-Wound Care: Skin Prep Primary Wound Dressing: Wound #4 Right,Lateral Foot: Prisma Ag Secondary Dressing: Wound #2 Left,Lateral Toe Fifth: Dry Gauze Boardered Foam Dressing Wound #4 Right,Lateral Foot: Dry Gauze Boardered Foam Dressing Dressing Change Frequency: Wound #2 Left,Lateral Toe Fifth: Change dressing every other day. Wound #4 Right,Lateral Foot: Change dressing every other day. Follow-up Appointments: Wound #2 Left,Lateral Toe Fifth: Return Appointment in 2 weeks. Wound #4 Right,Lateral Foot: Return Appointment in 2 weeks. Edema Control: Wound #2 Left,Lateral Toe Fifth: Elevate legs to the level of the heart and pump ankles as often as possible Other: - compression stockings Wound #4 Right,Lateral Foot: Elevate legs to  the level of the heart and pump ankles as often as possible Lewinski, Yamel ([id number]) Other: - compression stockings Additional Orders / Instructions: Wound #2 Left,Lateral Toe Fifth: Increase protein intake. Wound #4 Right,Lateral Foot: Increase protein intake. Medications-please add to  medication list.: Wound #2 Left,Lateral Toe Fifth: Other: - Vitamin C, Zinc, Multivitamins Tramadol Wound #4 Right,Lateral Foot: Other: - Vitamin C, Zinc, Multivitamins Tramadol Follow-Up Appointments: A follow-up appointment should be scheduled. Medication Reconciliation completed and provided to Patient/Care Provider. A Patient Clinical Summary of Care was provided to [facility] 1. Will switch from Iodoflex to [facility] as the wound seems to have minimal drainage 2. Will provide a prescription for tramadol as requested 3. Will continue with routine follow-up Electronic Signature(s) Signed: 08/30/2016 12:53:11 PM By: Rene Kocher, NP, Lenvil Swaim Previous Signature: 08/30/2016 12:51:53 PM Version By: Rene Kocher, NP, Jana Hakim By: Rene Kocher, NP, Derrion Tritz on 08/30/2016 12:53:10 [name] ([id number]) -------------------------------------------------------------------------------- SuperBill Details Patient Name: [name] Date of Service: 08/30/2016 Medical Record Number: [id number] Patient Account Number: [id number] Date of Birth/Sex: [birth year] (82 y.o. 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 Facility Procedures CPT4 Code Description: [id number] - DEBRIDE WOUND 1ST 20 SQ CM OR < ICD-10 Description Diagnosis L97.512 Non-pressure chronic ulcer of other part of right foot with Modifier: fat layer ex Quantity: 1 posed Physician Procedures CPT4 Code  Description: [id number] - WC PHYS DEBR WO ANESTH 20 SQ CM ICD-10 Description Diagnosis L97.512 Non-pressure chronic ulcer of other part of right foot with Modifier: fat layer ex Quantity: 1 posed Electronic Signature(s) Signed: 08/30/2016 12:53:26 PM By: Rene Kocher, NP, Pernie Grosso Entered By: Rene Kocher, NP, Hilma Steinhilber on 08/30/2016 12:53:25

## 2016-09-13 ENCOUNTER — Encounter: Payer: Medicare PPO | Attending: Internal Medicine | Admitting: Internal Medicine

## 2016-09-13 DIAGNOSIS — M1990 Unspecified osteoarthritis, unspecified site: Secondary | ICD-10-CM | POA: Insufficient documentation

## 2016-09-13 DIAGNOSIS — L97512 Non-pressure chronic ulcer of other part of right foot with fat layer exposed: Secondary | ICD-10-CM | POA: Insufficient documentation

## 2016-09-13 DIAGNOSIS — I70243 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: Secondary | ICD-10-CM | POA: Diagnosis not present

## 2016-09-13 DIAGNOSIS — L97523 Non-pressure chronic ulcer of other part of left foot with necrosis of muscle: Secondary | ICD-10-CM | POA: Diagnosis not present

## 2016-09-13 DIAGNOSIS — E039 Hypothyroidism, unspecified: Secondary | ICD-10-CM | POA: Insufficient documentation

## 2016-09-13 DIAGNOSIS — I70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: Secondary | ICD-10-CM | POA: Insufficient documentation

## 2016-09-14 NOTE — Progress Notes (Signed)
MELYNIE, BURLING ([id number]) Visit Report for 09/13/2016 Arrival Information Details Patient Name: [name], [name] Date of Service: 09/13/2016 10:45 AM Medical Record Patient Account Number: [id number] Number: Treating RN: Ahmed Prima 1919-09-17 (82 y.o. Other Clinician: Date of Birth/Sex: Female) Treating ROBSON, [place] Primary Care Physician: Lorelee Market Physician/Extender: G Referring Physician: Vinson Moselle in Treatment: 22 Visit Information History Since Last Visit All ordered tests and consults were completed: No Patient Arrived: [name] Added or deleted any medications: No Arrival Time: 10:48 Any new allergies or adverse reactions: No Accompanied By: daughter Had a fall or experienced change in No Transfer Assistance: None activities of daily living that may affect Patient Identification Verified: Yes risk of falls: Secondary Verification Process Yes Signs or symptoms of abuse/neglect since last No Completed: visito Patient Requires Transmission- No Hospitalized since last visit: No Based Precautions: Pain Present Now: No Patient Has Alerts: Yes Patient Alerts: 7/31 ABI: R-0.55 L-0.40 [facility] Electronic Signature(s) Signed: 09/13/2016 4:57:07 PM By: Alric Quan Entered By: Alric Quan on 09/13/2016 10:57:52 [name] ([id number]) -------------------------------------------------------------------------------- Encounter Discharge Information Details Patient Name: [name] Date of Service: 09/13/2016 10:45 AM Medical Record Patient Account Number: [id number] Number: Treating RN: Carolyne Fiscal, Debi 1919/02/24 (82 y.o. Other Clinician: Date of Birth/Sex: Female) Treating ROBSON, [place] Primary Care Physician: Lorelee Market Physician/Extender: G Referring Physician: Vinson Moselle in Treatment: 22 Encounter Discharge Information Items Discharge Pain Level: 0 Discharge  Condition: Stable Ambulatory Status: Walker Discharge Destination: Home Transportation: Private Auto Accompanied By: daughter Schedule Follow-up Appointment: Yes Medication Reconciliation completed Yes and provided to Patient/Care Kanton Kamel: Provided on Clinical Summary of Care: 09/13/2016 Form Type Recipient Paper Patient [facility] Electronic Signature(s) Signed: 09/13/2016 11:48:53 AM By: Ruthine Dose Entered By: Ruthine Dose on 09/13/2016 11:48:53 [name] ([id number]) -------------------------------------------------------------------------------- Lower Extremity Assessment Details Patient Name: [name] Date of Service: 09/13/2016 10:45 AM Medical Record Patient Account Number: [id number] Number: Treating RN: Ahmed Prima [birth year] (82 y.o. Other Clinician: Date of Birth/Sex: Female) Treating ROBSON, [place] Primary Care Physician: Lorelee Market Physician/Extender: G Referring Physician: Lorelee Market Weeks in Treatment: 22 Vascular Assessment Pulses: Posterior Tibial Dorsalis Pedis Palpable: [Left:Yes] [Right:Yes] Extremity colors, hair growth, and conditions: Extremity Color: [Left:Red] [Right:Red] Temperature of Extremity: [Left:Warm] [Right:Warm] Capillary Refill: [Left:< 3 seconds] [Right:< 3 seconds] Toe Nail Assessment Left: Right: Thick: No No Discolored: Yes Yes Deformed: No No Improper Length and Hygiene: No No Electronic Signature(s) Signed: 09/13/2016 4:57:07 PM By: Alric Quan Entered By: Alric Quan on 09/13/2016 11:02:58 [name] ([id number]) -------------------------------------------------------------------------------- Multi Wound Chart Details Patient Name: [name] Date of Service: 09/13/2016 10:45 AM Medical Record Patient Account Number: [id number] Number: Treating RN: Ahmed Prima 1919-01-31 (82 y.o. Other Clinician: Date of Birth/Sex: Female) Treating ROBSON,  [place] Primary Care Physician: Lorelee Market Physician/Extender: G Referring Physician: Lorelee Market Weeks in Treatment: 22 Vital Signs Height(in): 64 Pulse(bpm): 75 Weight(lbs): 158 Blood Pressure 124/51 (mmHg): Body Mass Index(BMI): 27 Temperature(F): 98.5 Respiratory Rate 18 (breaths/min): Photos: [N/A:N/A] Wound Location: Left Toe Fifth - Lateral Right Foot - Lateral N/A Wounding Event: Gradually Appeared Gradually Appeared N/A Primary Etiology: Arterial Insufficiency Ulcer Arterial Insufficiency Ulcer N/A Comorbid History: Cataracts, Hypertension, Cataracts, Hypertension, N/A Osteoarthritis Osteoarthritis Date Acquired: 04/07/2015 07/26/2016 N/A Weeks of Treatment: 22 6 N/A Wound Status: Open Open N/A Measurements L x W x D 0.1x0.1x0.1 0.3x0.3x0.2 N/A (cm) Area (cm) : 0.008 0.071 N/A Volume (cm) : 0.001 0.014 N/A % Reduction in Area: 83.00% -51.10% N/A % Reduction in Volume: 88.90% -180.00% N/A Classification: Partial Thickness  Partial Thickness N/A Exudate Amount: Medium Large N/A Exudate Type: Serous Serous N/A Exudate Color: amber amber N/A Wound Margin: Thickened Distinct, outline attached N/A Granulation Amount: Small (1-33%) None Present (0%) N/A Granulation Quality: Pink N/A N/A [name], [name] ([id number]) Necrotic Amount: Large (67-100%) Large (67-100%) N/A Exposed Structures: Fascia: No Fascia: No N/A Fat: No Fat: No Tendon: No Tendon: No Muscle: No Muscle: No Joint: No Joint: No Bone: No Bone: No Limited to Skin Limited to Skin Breakdown Breakdown Epithelialization: None None N/A Periwound Skin Texture: Edema: Yes No Abnormalities Noted N/A Excoriation: No Induration: No Callus: No Crepitus: No Fluctuance: No Friable: No Rash: No Scarring: No Periwound Skin Maceration: Yes Moist: Yes N/A Moisture: Moist: Yes Dry/Scaly: No Periwound Skin Color: Mottled: Yes No Abnormalities Noted N/A Atrophie Blanche: No Cyanosis:  No Ecchymosis: No Erythema: No Hemosiderin Staining: No Pallor: No Rubor: No Temperature: No Abnormality No Abnormality N/A Tenderness on Yes Yes N/A Palpation: Wound Preparation: Ulcer Cleansing: Ulcer Cleansing: N/A Rinsed/Irrigated with Rinsed/Irrigated with Saline Saline Topical Anesthetic Topical Anesthetic Applied: Other: lidocaine Applied: Other: lidocaine 4% 4% Treatment Notes Electronic Signature(s) Signed: 09/13/2016 4:57:07 PM By: Alric Quan Entered By: Alric Quan on 09/13/2016 11:27:58 [name] ([id number]) -------------------------------------------------------------------------------- [place] Details Patient Name: [name] Date of Service: 09/13/2016 10:45 AM Medical Record Patient Account Number: [id number] Number: Treating RN: Ahmed Prima 08/10/1919 (82 y.o. Other Clinician: Date of Birth/Sex: Female) Treating ROBSON, [place] Primary Care Physician: Lorelee Market Physician/Extender: G Referring Physician: Vinson Moselle in Treatment: 66 Active Inactive Abuse / Safety / Falls / Self Care Management Nursing Diagnoses: Potential for falls Goals: Patient will remain injury free Date Initiated: 04/06/2016 Goal Status: Active Interventions: Assess fall risk on admission and as needed Notes: Nutrition Nursing Diagnoses: Imbalanced nutrition Goals: Patient/caregiver agrees to and verbalizes understanding of need to use nutritional supplements and/or vitamins as prescribed Date Initiated: 04/06/2016 Goal Status: Active Interventions: Assess patient nutrition upon admission and as needed per policy Notes: Orientation to the Wound Care Program Nursing Diagnoses: Knowledge deficit related to the wound healing center program ANALIAH, GOODELL ([id number]) Goals: Patient/caregiver will verbalize understanding of the [place] Program Date Initiated: 04/06/2016 Goal  Status: Active Interventions: Provide education on orientation to the wound center Notes: Pain, Acute or Chronic Nursing Diagnoses: Pain, acute or chronic: actual or potential Potential alteration in comfort, pain Goals: Patient will verbalize adequate pain control and receive pain control interventions during procedures as needed Date Initiated: 04/06/2016 Goal Status: Active Interventions: Assess comfort goal upon admission Complete pain assessment as per visit requirements Notes: Wound/Skin Impairment Nursing Diagnoses: Impaired tissue integrity Goals: Ulcer/skin breakdown will have a volume reduction of 30% by week 4 Date Initiated: 04/06/2016 Goal Status: Active Ulcer/skin breakdown will have a volume reduction of 50% by week 8 Date Initiated: 04/06/2016 Goal Status: Active Ulcer/skin breakdown will have a volume reduction of 80% by week 12 Date Initiated: 04/06/2016 Goal Status: Active Interventions: Assess patient/caregiver ability to obtain necessary supplies [name], [name] ([id number]) Assess ulceration(s) every visit Notes: Electronic Signature(s) Signed: 09/13/2016 4:57:07 PM By: Alric Quan Entered By: Alric Quan on 09/13/2016 11:27:51 [name] ([id number]) -------------------------------------------------------------------------------- Non-Wound Condition Assessment Details Patient Name: [name] Date of Service: 09/13/2016 10:45 AM Medical Record Patient Account Number: [id number] Number: Treating RN: Ahmed Prima [birth year] (82 y.o. Other Clinician: Date of Birth/Gender: Female) Treating ROBSON, [place] Primary Care Physician: Lorelee Market Physician/Extender: G Referring Physician: Lorelee Market Weeks in Treatment: 22 Non-Wound Condition: Condition: Other Dermatologic Condition Location:  Foot Side: Left Periwound Skin Texture Texture Color No Abnormalities Noted: No No Abnormalities Noted:  No Moisture No Abnormalities Noted: No Notes blisters Electronic Signature(s) Signed: 09/13/2016 4:57:07 PM By: Alric Quan Entered By: Alric Quan on 09/13/2016 11:35:00 [name] ([id number]) -------------------------------------------------------------------------------- Pain Assessment Details Patient Name: [name] Date of Service: 09/13/2016 10:45 AM Medical Record Patient Account Number: [id number] Number: Treating RN: Ahmed Prima [birth year] (82 y.o. Other Clinician: Date of Birth/Sex: Female) Treating ROBSON, [place] Primary Care Physician: Lorelee Market Physician/Extender: G Referring Physician: Lorelee Market Weeks in Treatment: 22 Active Problems Location of Pain Severity and Description of Pain Patient Has Paino No Site Locations With Dressing Change: No Pain Management and Medication Current Pain Management: Electronic Signature(s) Signed: 09/13/2016 4:57:07 PM By: Alric Quan Entered By: Alric Quan on 09/13/2016 10:58:00 [name] ([id number]) -------------------------------------------------------------------------------- Patient/Caregiver Education Details Patient Name: [name] Date of Service: 09/13/2016 10:45 AM Medical Record Patient Account Number: [id number] Number: Treating RN: Carolyne Fiscal, Debi 1919-07-14 (82 y.o. Other Clinician: Date of Birth/Gender: Female) Treating ROBSON, [place] Primary Care Physician: Lorelee Market Physician/Extender: G Referring Physician: Vinson Moselle in Treatment: 22 Education Assessment Education Provided To: Patient Education Topics Provided Wound/Skin Impairment: Handouts: Other: change dressing as ordered Methods: Demonstration, Explain/Verbal Responses: State content correctly Electronic Signature(s) Signed: 09/13/2016 4:57:07 PM By: Alric Quan Entered By: Alric Quan on 09/13/2016 11:12:48 [name] ([id number]) -------------------------------------------------------------------------------- Wound Assessment Details Patient Name: [name] Date of Service: 09/13/2016 10:45 AM Medical Record Patient Account Number: [id number] Number: Treating RN: Ahmed Prima [birth year] (82 y.o. Other Clinician: Date of Birth/Sex: Female) Treating ROBSON, [place] Primary Care Physician: Lorelee Market Physician/Extender: G Referring Physician: Lorelee Market Weeks in Treatment: 22 Wound Status Wound Number: 2 Primary Arterial Insufficiency Ulcer Etiology: Wound Location: Left Toe Fifth - Lateral Wound Status: Open Wounding Event: Gradually Appeared Comorbid Cataracts, Hypertension, Date Acquired: 04/07/2015 History: Osteoarthritis Weeks Of Treatment: 22 Clustered Wound: No Photos Photo Uploaded By: Alric Quan on 09/13/2016 11:11:20 Wound Measurements Length: (cm) 0.1 % Reduction in Width: (cm) 0.1 % Reduction in Depth: (cm) 0.1 Epithelializati Area: (cm) 0.008 Tunneling: Volume: (cm) 0.001 Undermining: Area: 83% Volume: 88.9% on: None No No Wound Description Classification: Partial Thickness Foul Odor Afte Wound Margin: Thickened Exudate Amount: Medium Exudate Type: Serous Exudate Color: amber r Cleansing: No Wound Bed Granulation Amount: Small (1-33%) Exposed Structure Granulation Quality: Pink Fascia Exposed: No Necrotic Amount: Large (67-100%) Fat Layer Exposed: No [name], [name] ([id number]) Necrotic Quality: Adherent Slough Tendon Exposed: No Muscle Exposed: No Joint Exposed: No Bone Exposed: No Limited to Skin Breakdown Periwound Skin Texture Texture Color No Abnormalities Noted: No No Abnormalities Noted: No Callus: No Atrophie Blanche: No Crepitus: No Cyanosis: No Excoriation: No Ecchymosis: No Fluctuance: No Erythema: No Friable: No Hemosiderin Staining: No Induration: No Mottled: Yes Localized Edema:  Yes Pallor: No Rash: No Rubor: No Scarring: No Temperature / Pain Moisture Temperature: No Abnormality No Abnormalities Noted: No Tenderness on Palpation: Yes Dry / Scaly: No Maceration: Yes Moist: Yes Wound Preparation Ulcer Cleansing: Rinsed/Irrigated with Saline Topical Anesthetic Applied: Other: lidocaine 4%, Treatment Notes Wound #2 (Left, Lateral Toe Fifth) 1. Cleansed with: Clean wound with Normal Saline 2. Anesthetic Topical Lidocaine 4% cream to wound bed prior to debridement 3. Peri-wound Care: Skin Prep 4. Dressing Applied: Prisma Ag 5. Secondary Dressing Applied Bordered Foam Dressing Dry Gauze Electronic Signature(s) Signed: 09/13/2016 4:57:07 PM By: Alric Quan Entered By: Alric Quan on 09/13/2016 11:07:24 [name] ([id number]) Arens, Talayia ([id number]) -------------------------------------------------------------------------------- Wound  Assessment Details Patient Name: [name], [name] Date of Service: 09/13/2016 10:45 AM Medical Record Patient Account Number: [id number] Number: Treating RN: Ahmed Prima 03/05/1919 (82 y.o. Other Clinician: Date of Birth/Sex: Female) Treating ROBSON, [place] Primary Care Physician: Lorelee Market Physician/Extender: G Referring Physician: Lorelee Market Weeks in Treatment: 22 Wound Status Wound Number: 4 Primary Arterial Insufficiency Ulcer Etiology: Wound Location: Right Foot - Lateral Wound Status: Open Wounding Event: Gradually Appeared Comorbid Cataracts, Hypertension, Date Acquired: 07/26/2016 History: Osteoarthritis Weeks Of Treatment: 6 Clustered Wound: No Photos Photo Uploaded By: Alric Quan on 09/13/2016 11:11:21 Wound Measurements Length: (cm) 0.3 Width: (cm) 0.3 Depth: (cm) 0.2 Area: (cm) 0.071 Volume: (cm) 0.014 % Reduction in Area: -51.1% % Reduction in Volume: -180% Epithelialization: None Tunneling: No Undermining: No Wound  Description Classification: Partial Thickness Wound Margin: Distinct, outline attached Exudate Amount: Large Exudate Type: Serous Exudate Color: amber Foul Odor After Cleansing: No Wound Bed Granulation Amount: None Present (0%) Exposed Structure Necrotic Amount: Large (67-100%) Fascia Exposed: No Necrotic Quality: Adherent Slough Fat Layer Exposed: No [name], [name] ([id number]) Tendon Exposed: No Muscle Exposed: No Joint Exposed: No Bone Exposed: No Limited to Skin Breakdown Periwound Skin Texture Texture Color No Abnormalities Noted: No No Abnormalities Noted: No Moisture Temperature / Pain No Abnormalities Noted: No Temperature: No Abnormality Moist: Yes Tenderness on Palpation: Yes Wound Preparation Ulcer Cleansing: Rinsed/Irrigated with Saline Topical Anesthetic Applied: Other: lidocaine 4%, Treatment Notes Wound #4 (Right, Lateral Foot) 1. Cleansed with: Clean wound with Normal Saline 2. Anesthetic Topical Lidocaine 4% cream to wound bed prior to debridement 3. Peri-wound Care: Skin Prep 4. Dressing Applied: Prisma Ag 5. Secondary Dressing Applied Bordered Foam Dressing Dry Gauze Electronic Signature(s) Signed: 09/13/2016 4:57:07 PM By: Alric Quan Entered By: Alric Quan on 09/13/2016 11:09:52 [name] ([id number]) -------------------------------------------------------------------------------- Wound Assessment Details Patient Name: [name] Date of Service: 09/13/2016 10:45 AM Medical Record Patient Account Number: [id number] Number: Treating RN: Ahmed Prima 10/23/1918 (82 y.o. Other Clinician: Date of Birth/Sex: Female) Treating ROBSON, [place] Primary Care Physician: Lorelee Market Physician/Extender: G Referring Physician: Lorelee Market Weeks in Treatment: 22 Wound Status Wound Number: 5 Primary Arterial Insufficiency Ulcer Etiology: Wound Location: Left Malleolus - Medial Wound Status:  Open Wounding Event: Gradually Appeared Comorbid Cataracts, Hypertension, Date Acquired: 09/13/2016 History: Osteoarthritis Weeks Of Treatment: 0 Clustered Wound: No Photos Photo Uploaded By: Alric Quan on 09/13/2016 13:03:59 Wound Measurements Length: (cm) 0.3 Width: (cm) 0.4 Depth: (cm) 0.1 Area: (cm) 0.094 Volume: (cm) 0.009 % Reduction in Area: 0% % Reduction in Volume: 0% Epithelialization: None Tunneling: No Undermining: No Wound Description Classification: Partial Thickness Wound Margin: Distinct, outline attached Exudate Amount: Medium Exudate Type: Serous Exudate Color: amber Foul Odor After Cleansing: No Wound Bed Granulation Amount: Large (67-100%) Granulation Quality: Pink Necrotic Amount: None Present (0%) [name], [name] ([id number]) Periwound Skin Texture Texture Color No Abnormalities Noted: No No Abnormalities Noted: No Moisture Temperature / Pain No Abnormalities Noted: No Temperature: No Abnormality Moist: Yes Tenderness on Palpation: Yes Wound Preparation Ulcer Cleansing: Rinsed/Irrigated with Saline Topical Anesthetic Applied: None Treatment Notes Wound #5 (Left, Medial Malleolus) 1. Cleansed with: Clean wound with Normal Saline 2. Anesthetic Topical Lidocaine 4% cream to wound bed prior to debridement 3. Peri-wound Care: Skin Prep 4. Dressing Applied: Prisma Ag 5. Secondary Dressing Applied Bordered Foam Dressing Dry Gauze Electronic Signature(s) Signed: 09/13/2016 4:57:07 PM By: Alric Quan Entered By: Alric Quan on 09/13/2016 11:34:13 [name] ([id number]) -------------------------------------------------------------------------------- Vitals Details Patient Name: [name] Date of Service: 09/13/2016 10:45  AM Medical Record Patient Account Number: [id number] Number: Treating RN: Ahmed Prima 07/28/1919 (82 y.o. Other Clinician: Date of Birth/Sex: Female) Treating ROBSON,  [place] Primary Care Physician: Lorelee Market Physician/Extender: G Referring Physician: Lorelee Market Weeks in Treatment: 22 Vital Signs Time Taken: 10:58 Temperature (F): 98.5 Height (in): 64 Pulse (bpm): 75 Weight (lbs): 158 Respiratory Rate (breaths/min): 18 Body Mass Index (BMI): 27.1 Blood Pressure (mmHg): 124/51 Reference Range: 80 - 120 mg / dl Electronic Signature(s) Signed: 09/13/2016 4:57:07 PM By: Alric Quan Entered By: Alric Quan on 09/13/2016 11:02:00

## 2016-09-14 NOTE — Progress Notes (Signed)
[name], [name] ([id number]) Visit Report for 09/13/2016 Chief Complaint Document Details Patient Name: [name], [name] Date of Service: 09/13/2016 10:45 AM Medical Record Patient Account Number: [id number] Number: Treating RN: Ahmed Prima [birth year] (82 y.o. Other Clinician: Date of Birth/Sex: Female) Treating [place] Arch Primary Care Physician/Extender: Derrill Memo, [place] Physician: Referring Physician: Vinson Moselle in Treatment: 22 Information Obtained from: Patient Chief Complaint here for follow-up on right lateral foot ulcer and left foot ulcers Electronic Signature(s) Signed: 09/13/2016 5:08:56 PM By: [name] MD Entered By: [name] on 09/13/2016 12:20:39 [name] ([id number]) -------------------------------------------------------------------------------- Debridement Details Patient Name: [name] Date of Service: 09/13/2016 10:45 AM Medical Record Patient Account Number: [id number] Number: Treating RN: Carolyne Fiscal, Debi May 02, 1919 (82 y.o. Other Clinician: Date of Birth/Sex: Female) Treating Lin Hackmann Primary Care Physician/Extender: Derrill Memo, [place] Physician: Referring Physician: Vinson Moselle in Treatment: 22 Debridement Performed for Wound #4 Right,Lateral Foot Assessment: Performed By: Physician Ricard Dillon, MD Debridement: Debridement Pre-procedure Yes - 11:29 Verification/Time Out Taken: Start Time: 11:30 Pain Control: Lidocaine 4% Topical Solution Level: Skin/Subcutaneous Tissue Total Area Debrided (L x 0.3 (cm) x 0.3 (cm) = 0.09 (cm) W): Tissue and other Viable, Non-Viable, Exudate, Fibrin/Slough, Subcutaneous material debrided: Instrument: Curette Bleeding: Minimum Hemostasis Achieved: Pressure End Time: 11:32 Procedural Pain: 0 Post Procedural Pain: 0 Response to Treatment: Procedure was tolerated well Post Debridement Measurements of Total  Wound Length: (cm) 0.3 Width: (cm) 0.3 Depth: (cm) 0.3 Volume: (cm) 0.021 Character of Wound/Ulcer Post Requires Further Debridement Debridement: Severity of Tissue Post Debridement: Fat layer exposed Post Procedure Diagnosis Same as Pre-procedure Electronic Signature(s) Signed: 09/13/2016 4:57:07 PM By: Myriam Jacobson, Estill Bamberg ([id number]) Signed: 09/13/2016 5:08:56 PM By: [name] MD Entered By: Alric Quan on 09/13/2016 11:32:13 [name] ([id number]) -------------------------------------------------------------------------------- HPI Details Patient Name: [name] Date of Service: 09/13/2016 10:45 AM Medical Record Patient Account Number: [id number] Number: Treating RN: Carolyne Fiscal, Debi 08/22/1919 (82 y.o. Other Clinician: Date of Birth/Sex: Female) Treating Keveon Amsler Primary Care Physician/Extender: Derrill Memo, [place] Physician: Referring Physician: Lorelee Market Weeks in Treatment: 22 History of Present Illness HPI Description: 04/06/16; this is a 82 year old woman who arrives accompanied by 2 daughters for a wound on her left ankle and her left fifth toe. These have apparently been present for a year. I'm not quite certain how she came to this clinic however she was being followed by Sharlotte Alamo her podiatrist for these wounds. She was also referred to [facility] and they apparently did a test presumably arterial studies although we don't have any of these results and we couldn't get through to the office today. The family but has been applying a combination of Bactroban and a light bandage and perhaps more recently Silvadene cream. She did have an x-ray of the foot roughly 6 months ago at the podiatry office the family was unaware that if there were any abnormalities. Apparently they have not seen any healing here. Our intake nurse noted a slight skin tear on the right anterior lower leg. Nobody  seemed aware of this. ABIs calculated in this clinic was 0.3 on the right and 0.4 on the left I have reviewed things [facility]. There is very little information on this patient. She apparently follows in current total clinic which we don't have information from. She has mentioned already been to a [facility]. She has a history of hypothyroidism, nephrolithiasis arthritis and has had a previous mastectomy. 04/13/16; patient's x-ray was normal. She is already  been to see Dr. Delana Meyer vascular surgery. Her arterial exam was from November 2016 this showed a left ABI of 0.62 her right of 0.76. Her duplex ultrasound of the left leg showed biphasic waves in the common femoral and distal femoral artery however monophasic waves in the superficial femoral artery proximal and mid biphasic it distal. 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 The patient tells me that she has pain at night when she tries to lie down which is improved by getting up and sitting in the chair this sounds like claudication at rest. Her wounds are on the left medial malleolus and the dorsal fifth toe small punched out wounds that are right on bone. We use Santyl last week 04/20/16: nurse informed me pt has declined evaluation for significant PAD. she denies systemic s/s of infections. 04/27/16;; the patient has had noninvasive arterial studies done in November 2016. ABI and the left was 0.62. Monophasic waves at the superficial femoral artery. Occluded to the posterior tibial artery. So had greater than 50% stenosis of the right superficial femoral and greater than 50% stenosis of the left superficial femoral artery. She had bilateral tibial peroneal artery disease. Both of the wounds on the left fifth toe and left lateral malleolus have been present for more than a year. They have been to see vein and vascular. The patient has some pain but miraculously I think the wounds have largely been stable. No evidence of infection 05/04/16;  she goes for a noninvasive study tomorrow and then sees Dr. Fletcher Anon on Monday. By the time she is here next week we should have a better picture of whether something can be done with regards to her arterial flow. We continue to have ischemic-looking wounds on the left fifth toe and left lateral malleolus. 05/10/16; the patient went for her arterial studies and saw Dr. Fletcher Anon. 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 The feeling is that she has occlusion of the SFA. The feeling would she would be a candidate for a stent to the SFA. The patient did not make a decision to proceed with the procedure and she is here with family members to discuss this me today. He shouldn't has a lot of pain and cannot sleep and rest well at night per her family. 05/24/16; the patient went and had a complex revascularization/angioplasty of the left superficial femoral artery followed by drug-coated balloon angioplasty and spots stenting. She tolerated the procedure well. She was recommended for dual antiplatelet drugs with Plavix and aspirin for at least a month. She went yesterday for I believe follow-up serial Dopplers and ABIs although I don't see these results. The patient is unfortunately complaining of a lot of pain in her bilateral lower legs below the knees from the ankle to the knees. She apparently was prescribed lidocaine and apparently put this on her legs instead of over the wound areas. This may have something to do with it however there is a lot of edema in her bilateral legs I was able to find her arterial studies from yesterday. The left ABI has improved up to 0.66 post left SFA stent. The bilateral great toe indices remain abnormal with the left being in the 0.25 range. Duplex ultrasound showed her left SFA stent is patent monophasic waveforms persist in the left leg 06/07/16; continued punched-out areas over the dorsal left fifth toe and left lateral malleolus.  No major improvement 06/28/16; the areas over her dorsal left fifth toe and left  lateral malleolus are covered in surface slough we are using Iodoflex 07/05/16. We have been using Iodoflex for 2-3 weeks now. I have not been debridement is because of pain 07/19/16 currently we have been using Iodoflex for roughly the past month. Previously we were unable to debride due to pain although today patient states that the pain is not nearly as severe as it has been in the past. In fact she rates this to be a 1 out of 10 and it worse to a to 10 with palpation of the wound. All and all her and her daughter feel like this is actually doing steadily better at this point in time. She is pleased with progress and we have been seeing her every 2 weeks. 08/02/16; this is a delightful 82 year old woman I have not seen in over a month. She has arterial insufficiency wounds remaining over the left lateral malleolus and the left fifth toe. With the help of Dr. Fletcher Anon we are able to get her revascularized on the left. The 2 wounds on the left have been making good progress and are definitely smaller especially the area over the left lateral malleolus. She is certainly in a lot less pain than she used to be although I still think there is some claudication type pain. Unfortunately she is developed a area on the right lateral foot which is a small open area but I think is threatened. I suspect a small ischemic areas well. Also on her right fifth toe there is an area that is not open but looks as though it is receiving too much pressure from footwear. Finally an area over her right malleolus although I don't think this is on its way to anything ominous 08/17/16 patient presents today for follow up evaluation she tells me that she is really doing fairly well from a pain standpoint compared to where she has been previous. She is tolerating the dressing changes without any complication. She continues to have discharge and  drainage however. 08-30-16 Ms. Hoelzel presents today with her daughter, she states that she continues to have intermittent shooting pains to the left lateral fifth toe at this site of seems to be a healed ulcer. She denies any other issues that her wound related since her last appointment. Her daughter states that she is in need of a tramadol refill at this time as she uses half a tablet at at bedtime to aid in sleep due to foot pain. Iodoflex has been used on all wounds in previous dressing changes. 09/13/16; the patient has ischemic wounds in her feet. The area over the left lateral malleolus is healed and the area over the left fifth toe looks improved.. She has an area on the lateral aspect of her right foot which is a small but deep wound. Finally she has a new open area on the medial aspect of the left medial malleolus. This is superficial [name], [name] ([id number]) Electronic Signature(s) Signed: 09/13/2016 5:08:56 PM By: [name] MD Entered By: [name] on 09/13/2016 12:22:48 [name] ([id number]) -------------------------------------------------------------------------------- Physical Exam Details Patient Name: [name] Date of Service: 09/13/2016 10:45 AM Medical Record Patient Account Number: [id number] Number: Treating RN: Ahmed Prima 1919/03/09 (82 y.o. Other Clinician: Date of Birth/Sex: Female) Treating Klara Stjames Primary Care Physician/Extender: Derrill Memo, [place] Physician: Referring Physician: Lorelee Market Weeks in Treatment: 22 Constitutional Sitting or standing Blood Pressure is within target range for patient.. Pulse regular and within target range for patient.Marland Kitchen Respirations regular, non-labored and within target  range.. Temperature is normal and within the target range for the patient.. Patient is no distress. Eyes Conjunctivae clear. No discharge.Marland Kitchen Respiratory Respiratory effort is easy and symmetric  bilaterally. Rate is normal at rest and on room air.. Cardiovascular Pedal pulses absent bilaterally.. Edema present in both extremities.. Lymphatic Nonpalpable in the popliteal or inguinal area. Integumentary (Hair, Skin) Blisters on the dorsal aspect of her foot of unclear etiology. Psychiatric No evidence of depression, anxiety, or agitation. Calm, cooperative, and communicative. Appropriate interactions and affect.. Notes Wound exam; this is substantial area the patient had over the left lateral malleolus is closed. The area on her left lateral fifth toe looks somewhat better. Slight eschar over this which I did not debridement. She has a new area over the left medial malleolus which is superficial. oDeep area on the right lateral foot debrided of surface slough nonviable subcutaneous tissue using a #3 curet. Base of this cleans up quite nicely although there is considerable depth relative to its area Electronic Signature(s) Signed: 09/13/2016 5:08:56 PM By: [name] MD Entered By: [name] on 09/13/2016 12:27:29 [name] ([id number]) -------------------------------------------------------------------------------- Physician Orders Details Patient Name: [name] Date of Service: 09/13/2016 10:45 AM Medical Record Patient Account Number: [id number] Number: Treating RN: Ahmed Prima 05-Nov-1918 (82 y.o. Other Clinician: Date of Birth/Sex: Female) Treating Aryella Besecker Primary Care Physician/Extender: Derrill Memo, [place] Physician: Referring Physician: Vinson Moselle in Treatment: 22 Verbal / Phone Orders: Yes Clinician: Carolyne Fiscal, Debi Read Back and Verified: Yes Diagnosis Coding Wound Cleansing Wound #2 Left,Lateral Toe Fifth o Clean wound with Normal Saline. - for clinic use o Cleanse wound with mild soap and water o May Shower, gently pat wound dry prior to applying new dressing. Wound #5 Left,Medial  Malleolus o Clean wound with Normal Saline. - for clinic use o Cleanse wound with mild soap and water o May Shower, gently pat wound dry prior to applying new dressing. Wound #4 Right,Lateral Foot o Clean wound with Normal Saline. - for clinic use o Cleanse wound with mild soap and water o May Shower, gently pat wound dry prior to applying new dressing. Anesthetic Wound #2 Left,Lateral Toe Fifth o Topical Lidocaine 4% cream applied to wound bed prior to debridement - for clinic use Wound #5 Left,Medial Malleolus o Topical Lidocaine 4% cream applied to wound bed prior to debridement - for clinic use Wound #4 Right,Lateral Foot o Topical Lidocaine 4% cream applied to wound bed prior to debridement - for clinic use Skin Barriers/Peri-Wound Care Wound #5 Left,Medial Malleolus o Skin Prep Primary Wound Dressing Wound #4 Right,Lateral Foot o Prisma Ag Schiltz, Fernande ([id number]) Wound #5 Left,Medial Malleolus o Prisma Ag Secondary Dressing Wound #2 Left,Lateral Toe Fifth o Dry Gauze o Boardered Foam Dressing Wound #5 Left,Medial Malleolus o Dry Gauze o Boardered Foam Dressing Wound #4 Right,Lateral Foot o Dry Gauze o Boardered Foam Dressing Dressing Change Frequency Wound #2 Left,Lateral Toe Fifth o Change dressing every other day. Wound #5 Left,Medial Malleolus o Change dressing every other day. Wound #4 Right,Lateral Foot o Change dressing every other day. Follow-up Appointments Wound #2 Left,Lateral Toe Fifth o Return Appointment in 2 weeks. Wound #5 Left,Medial Malleolus o Return Appointment in 2 weeks. Wound #4 Right,Lateral Foot o Return Appointment in 2 weeks. Edema Control Wound #2 Left,Lateral Toe Fifth o Elevate legs to the level of the heart and pump ankles as often as possible o Other: - compression stockings Wound #5 Left,Medial Malleolus o Elevate legs to the level of the  heart and pump ankles as often  as possible o Other: - compression stockings Wound #4 Right,Lateral Foot o Elevate legs to the level of the heart and pump ankles as often as possible Pusch, Esthela ([id number]) o Other: - compression stockings Additional Orders / Instructions Wound #2 Left,Lateral Toe Fifth o Increase protein intake. Wound #5 Left,Medial Malleolus o Increase protein intake. Wound #4 Right,Lateral Foot o Increase protein intake. Medications-please add to medication list. Wound #2 Left,Lateral Toe Fifth o Other: - Vitamin C, Zinc, Multivitamins Tramadol Wound #5 Left,Medial Malleolus o Other: - Vitamin C, Zinc, Multivitamins Tramadol Wound #4 Right,Lateral Foot o Other: - Vitamin C, Zinc, Multivitamins Tramadol Electronic Signature(s) Signed: 09/13/2016 4:57:07 PM By: Alric Quan Signed: 09/13/2016 5:08:56 PM By: [name] MD Entered By: Alric Quan on 09/13/2016 11:48:24 [name] ([id number]) -------------------------------------------------------------------------------- Problem List Details Patient Name: [name] Date of Service: 09/13/2016 10:45 AM Medical Record Patient Account Number: [id number] Number: Treating RN: Carolyne Fiscal, Debi 07/31/1919 (82 y.o. 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 04/06/2016 Yes of other part of foot I70.243 Atherosclerosis of native arteries of left leg with ulceration 04/06/2016 Yes of ankle L97.512 Non-pressure chronic ulcer of other part of right foot with 08/30/2016 Yes fat layer  exposed Inactive Problems Resolved Problems Electronic Signature(s) Signed: 09/13/2016 5:08:56 PM By: [name] MD Entered By: [name] on 09/13/2016 12:19:51 [name] ([id number]) -------------------------------------------------------------------------------- Progress Note Details Patient Name: [name] Date of Service: 09/13/2016 10:45 AM Medical Record Patient Account Number: [id number] Number: Treating RN: Carolyne Fiscal, Debi 1919-01-17 (82 y.o. Other Clinician: Date of Birth/Sex: Female) Treating Braelen Sproule Primary Care Physician/Extender: Derrill Memo, [place] Physician: Referring Physician: Vinson Moselle in Treatment: 22 Subjective Chief Complaint Information obtained from Patient here for follow-up on right lateral foot ulcer and left foot ulcers History of Present Illness (HPI) 04/06/16; this is a 82 year old woman who arrives accompanied by 2 daughters for a wound on her left ankle and her left fifth toe. These have apparently been present for a year. I'm not quite certain how she came to this clinic however she was being followed by Sharlotte Alamo her podiatrist for these wounds. She was also referred to [facility] and they apparently did a test presumably arterial studies although we don't have any of these results and we couldn't get through to the office today. The family but has been applying a combination of Bactroban and a light bandage and perhaps more recently Silvadene cream. She did have an x-ray of the foot roughly 6 months ago at the podiatry office the family was unaware that if there were any abnormalities. Apparently they have not seen any healing here. Our intake nurse noted a slight skin tear on the right anterior lower leg. Nobody seemed aware of this. ABIs calculated in this clinic was 0.3 on the right and 0.4 on the left I have reviewed things [facility]. There is very little  information on this patient. She apparently follows in current total clinic which we don't have information from. She has mentioned already been to a [facility]. She has a history of hypothyroidism, nephrolithiasis arthritis and has had a previous mastectomy. 04/13/16; patient's x-ray was  normal. She is already been to see Dr. Delana Meyer vascular surgery. Her arterial exam was from November 2016 this showed a left ABI of 0.62 her right of 0.76. Her duplex ultrasound of the left leg showed biphasic waves in the common femoral and distal femoral artery however monophasic waves in the superficial femoral artery proximal and mid biphasic it distal. 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 The patient tells me that she has pain at night when she tries to lie down which is improved by getting up and sitting in the chair this sounds like claudication at rest. Her wounds are on the left medial malleolus and the dorsal fifth toe small punched out wounds that are right on bone. We use Santyl last week 04/20/16: nurse informed me pt has declined evaluation for significant PAD. she denies systemic s/s of infections. 04/27/16;; the patient has had noninvasive arterial studies done in November 2016. ABI and the left was 0.62. Monophasic waves at the superficial femoral artery. Occluded to the posterior tibial artery. So had greater than 50% stenosis of the right superficial femoral and greater than 50% stenosis of the left superficial femoral artery. She had bilateral tibial peroneal artery disease. Both of the wounds on the left fifth toe and left lateral malleolus have been present for more than a year. They have been to see vein and [name], [name] ([id number]) vascular. The patient has some pain but miraculously I think the wounds have largely been stable. No evidence of infection 05/04/16; she goes for a noninvasive study tomorrow and then sees Dr. Fletcher Anon on Monday. By the time she is here next week we should have a  better picture of whether something can be done with regards to her arterial flow. We continue to have ischemic-looking wounds on the left fifth toe and left lateral malleolus. 05/10/16; the patient went for her arterial studies and saw Dr. Fletcher Anon. As predicted she is felt to have critical limb ischemia. The feeling is that she has occlusion of the SFA. The feeling would she would be a candidate for a stent to the SFA. The patient did not make a decision to proceed with the procedure and she is here with family members to discuss this me today. He shouldn't has a lot of pain and cannot sleep and rest well at night per her family. 05/24/16; the patient went and had a complex revascularization/angioplasty of the left superficial femoral artery followed by drug-coated balloon angioplasty and spots stenting. She tolerated the procedure well. She was recommended for dual antiplatelet drugs with Plavix and aspirin for at least a month. She went yesterday for I believe follow-up serial Dopplers and ABIs although I don't see these results. The patient is unfortunately complaining of a lot of pain in her bilateral lower legs below the knees from the ankle to the knees. She apparently was prescribed lidocaine and apparently put this on her legs instead of over the wound areas. This may have something to do with it however there is a lot of edema in her bilateral legs I was able to find her arterial studies from yesterday. The left ABI has improved up to 0.66 post left SFA stent. The bilateral great toe indices remain abnormal with the left being in the 0.25 range. Duplex ultrasound showed her left SFA stent is patent monophasic waveforms persist in the left leg 06/07/16; continued punched-out areas over the dorsal left fifth toe and left lateral malleolus. No major improvement 06/28/16; the areas over her dorsal left fifth  toe and left lateral malleolus are covered in surface slough we are using  Iodoflex 07/05/16. We have been using Iodoflex for 2-3 weeks now. I have not been debridement is because of pain 07/19/16 currently we have been using Iodoflex for roughly the past month. Previously we were unable to debride due to pain although today patient states that the pain is not nearly as severe as it has been in the past. In fact she rates this to be a 1 out of 10 and it worse to a to 10 with palpation of the wound. All and all her and her daughter feel like this is actually doing steadily better at this point in time. She is pleased with progress and we have been seeing her every 2 weeks. 08/02/16; this is a delightful 82 year old woman I have not seen in over a month. She has arterial insufficiency wounds remaining over the left lateral malleolus and the left fifth toe. With the help of Dr. Fletcher Anon we are able to get her revascularized on the left. The 2 wounds on the left have been making good progress and are definitely smaller especially the area over the left lateral malleolus. She is certainly in a lot less pain than she used to be although I still think there is some claudication type pain. Unfortunately she is developed a area on the right lateral foot which is a small open area but I think is threatened. I suspect a small ischemic areas well. Also on her right fifth toe there is an area that is not open but looks as though it is receiving too much pressure from footwear. Finally an area over her right malleolus although I don't think this is on its way to anything ominous 08/17/16 patient presents today for follow up evaluation she tells me that she is really doing fairly well from a pain standpoint compared to where she has been previous. She is tolerating the dressing changes without any complication. She continues to have discharge and drainage however. 08-30-16 Ms. Michie presents today with her daughter, she states that she continues to have intermittent shooting pains to  the left lateral fifth toe at this site of seems to be a healed ulcer. She denies any other issues that her wound related since her last appointment. Her daughter states that she is in need of a tramadol refill at this time as she uses half a tablet at at bedtime to aid in sleep due to foot pain. Iodoflex has been used on all wounds in previous dressing changes. [name], [name] ([id number]) 09/13/16; the patient has ischemic wounds in her feet. The area over the left lateral malleolus is healed and the area over the left fifth toe looks improved.. She has an area on the lateral aspect of her right foot which is a small but deep wound. Finally she has a new open area on the medial aspect of the left medial malleolus. This is superficial Objective Constitutional Sitting or standing Blood Pressure is within target range for patient.. Pulse regular and within target range for patient.Marland Kitchen Respirations regular, non-labored and within target range.. Temperature is normal and within the target range for the patient.. Patient is no distress. Vitals Time Taken: 10:58 AM, Height: 64 in, Weight: 158 lbs, BMI: 27.1, Temperature: 98.5 F, Pulse: 75 bpm, Respiratory Rate: 18 breaths/min, Blood Pressure: 124/51 mmHg. Eyes Conjunctivae clear. No discharge.Marland Kitchen Respiratory Respiratory effort is easy and symmetric bilaterally. Rate is normal at rest and on room air.. Cardiovascular Pedal pulses  absent bilaterally.. Edema present in both extremities.. Lymphatic Nonpalpable in the popliteal or inguinal area. Psychiatric No evidence of depression, anxiety, or agitation. Calm, cooperative, and communicative. Appropriate interactions and affect.. General Notes: Wound exam; this is substantial area the patient had over the left lateral malleolus is closed. The area on her left lateral fifth toe looks somewhat better. Slight eschar over this which I did not debridement. She has a new area over the left medial  malleolus which is superficial. Deep area on the right lateral foot debrided of surface slough nonviable subcutaneous tissue using a #3 curet. Base of this cleans up quite nicely although there is considerable depth relative to its area Integumentary (Hair, Skin) Blisters on the dorsal aspect of her foot of unclear etiology. Wound #2 status is Open. Original cause of wound was Gradually Appeared. The wound is located on the Left,Lateral Toe Fifth. The wound measures 0.1cm length x 0.1cm width x 0.1cm depth; 0.008cm^2 area and 0.001cm^3 volume. The wound is limited to skin breakdown. There is no tunneling or undermining [name], [name] ([id number]) noted. There is a medium amount of serous drainage noted. The wound margin is thickened. There is small (1-33%) pink granulation within the wound bed. There is a large (67-100%) amount of necrotic tissue within the wound bed including Adherent Slough. The periwound skin appearance exhibited: Localized Edema, Maceration, Moist, Mottled. The periwound skin appearance did not exhibit: Callus, Crepitus, Excoriation, Fluctuance, Friable, Induration, Rash, Scarring, Dry/Scaly, Atrophie Blanche, Cyanosis, Ecchymosis, Hemosiderin Staining, Pallor, Rubor, Erythema. Periwound temperature was noted as No Abnormality. The periwound has tenderness on palpation. Wound #4 status is Open. Original cause of wound was Gradually Appeared. The wound is located on the Right,Lateral Foot. The wound measures 0.3cm length x 0.3cm width x 0.2cm depth; 0.071cm^2 area and 0.014cm^3 volume. The wound is limited to skin breakdown. There is no tunneling or undermining noted. There is a large amount of serous drainage noted. The wound margin is distinct with the outline attached to the wound base. There is no granulation within the wound bed. There is a large (67-100%) amount of necrotic tissue within the wound bed including Adherent Slough. The periwound skin appearance  exhibited: Moist. Periwound temperature was noted as No Abnormality. The periwound has tenderness on palpation. Wound #5 status is Open. Original cause of wound was Gradually Appeared. The wound is located on the Left,Medial Malleolus. The wound measures 0.3cm length x 0.4cm width x 0.1cm depth; 0.094cm^2 area and 0.009cm^3 volume. There is no tunneling or undermining noted. There is a medium amount of serous drainage noted. The wound margin is distinct with the outline attached to the wound base. There is large (67-100%) pink granulation within the wound bed. There is no necrotic tissue within the wound bed. The periwound skin appearance exhibited: Moist. Periwound temperature was noted as No Abnormality. The periwound has tenderness on palpation. 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 There was a Skin/Subcutaneous Tissue Debridement [id number]) debridement with total area of 0.09 sq cm performed by Ricard Dillon, MD. with the following instrument(s): Curette to remove Viable  and Non-Viable tissue/material including Exudate, Fibrin/Slough, and Subcutaneous after achieving pain control using Lidocaine 4% Topical Solution. A time out was conducted at 11:29, prior to the start of the procedure. [name], [name] ([id number]) A Minimum amount of bleeding was controlled with Pressure. The procedure was tolerated well with a pain level of 0 throughout and a pain level of 0 following the procedure. Post Debridement Measurements: 0.3cm length x 0.3cm  width x 0.3cm depth; 0.021cm^3 volume. Character of Wound/Ulcer Post Debridement requires further debridement. Severity of Tissue Post Debridement is: Fat layer exposed. Post procedure Diagnosis Wound #4: Same as Pre-Procedure Plan Wound Cleansing: Wound #2 Left,Lateral Toe Fifth: Clean wound with Normal Saline. - for clinic use Cleanse wound with mild soap and water May Shower, gently pat wound dry prior to applying new dressing. Wound #5 Left,Medial Malleolus: Clean wound with Normal Saline. - for clinic use Cleanse wound with mild soap and water May Shower, gently pat wound dry prior to applying new dressing. Wound #4 Right,Lateral Foot: Clean wound with Normal Saline. - for clinic use Cleanse wound with mild soap and water May Shower, gently pat wound dry prior to applying new dressing. Anesthetic: Wound #2 Left,Lateral Toe Fifth: Topical Lidocaine 4% cream applied to wound bed prior to debridement - for clinic use Wound #5 Left,Medial Malleolus: Topical Lidocaine 4% cream applied to wound bed prior to debridement - for clinic use Wound #4 Right,Lateral Foot: Topical Lidocaine 4% cream applied to wound bed prior to debridement - for clinic use Skin Barriers/Peri-Wound Care: Wound #5 Left,Medial Malleolus: Skin Prep Primary Wound Dressing: Wound #4 Right,Lateral Foot: Prisma Ag Wound #5 Left,Medial Malleolus: Prisma Ag Secondary Dressing: Wound #2 Left,Lateral Toe Fifth: Dry Gauze Boardered Foam Dressing Wound #5 Left,Medial Malleolus: Dry Gauze Boardered Foam Dressing Wound #4 Right,Lateral Foot: [name], [name] ([id number]) Dry Gauze Boardered Foam Dressing Dressing Change Frequency: Wound #2 Left,Lateral Toe Fifth: Change dressing every other day. Wound #5 Left,Medial Malleolus: Change dressing every other day. Wound #4 Right,Lateral Foot: Change dressing every other day. Follow-up Appointments: Wound #2 Left,Lateral Toe Fifth: Return Appointment in 2  weeks. Wound #5 Left,Medial Malleolus: Return Appointment in 2 weeks. Wound #4 Right,Lateral Foot: Return Appointment in 2 weeks. Edema Control: Wound #2 Left,Lateral Toe Fifth: Elevate legs to the level of the heart and pump ankles as often as possible Other: - compression stockings Wound #5 Left,Medial Malleolus: Elevate legs to the level of the heart and pump ankles as often as possible Other: - compression stockings Wound #4 Right,Lateral Foot: Elevate legs to the level of the heart and pump ankles as often as possible Other: - compression stockings Additional Orders / Instructions: Wound #2 Left,Lateral Toe Fifth: Increase protein intake. Wound #5 Left,Medial Malleolus: Increase protein intake. Wound #4 Right,Lateral Foot: Increase protein intake. Medications-please add to medication list.: Wound #2 Left,Lateral Toe Fifth: Other: - Vitamin C, Zinc, Multivitamins Tramadol Wound #5 Left,Medial Malleolus: Other: - Vitamin C, Zinc, Multivitamins Tramadol Wound #4 Right,Lateral Foot: Other: - Vitamin C, Zinc, Multivitamins Tramadol continue with prisma to all wound areas spent some talking about pressure relief [name], [name] ([id number]) Electronic Signature(s) Signed: 09/13/2016 5:08:56 PM By: [name] MD Entered By: [name] on 09/13/2016 12:29:15 [name] ([id number]) -------------------------------------------------------------------------------- SuperBill Details Patient Name: [name] Date of Service: 09/13/2016 Medical Record Patient Account Number: [id number] Number: Treating RN: Carolyne Fiscal, Debi 08-29-1919 (82 y.o. 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 of other part of right foot with fat layer exposed Facility Procedures CPT4 Code Description: [id number] - DEB SUBQ TISSUE 20 SQ CM/< ICD-10 Description Diagnosis L97.523 Non-pressure chronic ulcer of other part of left foot w L97.512 Non-pressure chronic ulcer of other part of right foot Modifier: ith necrosis of m with fat layer ex Quantity: 1 uscle posed Physician Procedures CPT4 Code Description: [id number] - WC PHYS SUBQ TISS 20 SQ CM ICD-10 Description Diagnosis L97.523 Non-pressure chronic ulcer of other part of left foot wi L97.512 Non-pressure chronic ulcer of other part of right foot w Modifier: th necrosis of m ith fat layer ex Quantity: 1 uscle posed Electronic Signature(s) Signed: 09/13/2016 5:08:56 PM By: [name] MD Entered By: [name] on 09/13/2016 12:30:37

## 2016-09-27 ENCOUNTER — Encounter: Payer: Medicare PPO | Admitting: Internal Medicine

## 2016-09-27 DIAGNOSIS — I70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: Secondary | ICD-10-CM | POA: Diagnosis not present

## 2016-09-28 NOTE — Progress Notes (Signed)
CORNELL, BRUCKER ([id number]) Visit Report for 09/27/2016 Chief Complaint Document Details Patient Name: CANTRELL, MCGINNISS Date of Service: 09/27/2016 10:45 AM Medical Record Patient Account Number: [id number] Number: Treating RN: Ahmed Prima 03-02-1919 (82 y.o. Other Clinician: Date of Birth/Sex: Female) Treating Laronda Lisby Primary Care Physician/Extender: Derrill Memo, Meindert Physician: Referring Physician: Vinson Moselle in Treatment: 24 Information Obtained from: Patient Chief Complaint here for follow-up on right lateral foot ulcer and left foot ulcers Electronic Signature(s) Signed: 09/27/2016 5:07:10 PM By: [name] MD Entered By: [name] on 09/27/2016 11:22:34 [name] ([id number]) -------------------------------------------------------------------------------- Debridement Details Patient Name: [name] Date of Service: 09/27/2016 10:45 AM Medical Record Patient Account Number: [id number] Number: Treating RN: Carolyne Fiscal, Debi [birth year] (82 y.o. Other Clinician: Date of Birth/Sex: Female) Treating Bessye Stith Primary Care Physician/Extender: Derrill Memo, Meindert Physician: Referring Physician: Vinson Moselle in Treatment: 24 Debridement Performed for Wound #4 Right,Lateral Foot Assessment: Performed By: Physician Ricard Dillon, MD Debridement: Debridement Pre-procedure Yes - 11:08 Verification/Time Out Taken: Start Time: 11:09 Pain Control: Lidocaine 4% Topical Solution Level: Skin/Subcutaneous Tissue Total Area Debrided (L x 0.3 (cm) x 0.3 (cm) = 0.09 (cm) W): Tissue and other Viable, Non-Viable, Exudate, Fibrin/Slough, Subcutaneous material debrided: Instrument: Curette Bleeding: Minimum Hemostasis Achieved: Pressure End Time: 11:11 Procedural Pain: 0 Post Procedural Pain: 0 Response to Treatment: Procedure was tolerated well Post Debridement Measurements of Total  Wound Length: (cm) 0.3 Width: (cm) 0.3 Depth: (cm) 0.3 Volume: (cm) 0.021 Character of Wound/Ulcer Post Requires Further Debridement Debridement: Severity of Tissue Post Debridement: Fat layer exposed Post Procedure Diagnosis Same as Pre-procedure Electronic Signature(s) Signed: 09/27/2016 4:44:26 PM By: Myriam Jacobson, Estill Bamberg ([id number]) Signed: 09/27/2016 5:07:10 PM By: [name] MD Entered By: [name] on 09/27/2016 11:20:36 [name] ([id number]) -------------------------------------------------------------------------------- HPI Details Patient Name: [name] Date of Service: 09/27/2016 10:45 AM Medical Record Patient Account Number: [id number] Number: Treating RN: Carolyne Fiscal, Debi Oct 22, 1918 (82 y.o. Other Clinician: Date of Birth/Sex: Female) Treating Ahad Colarusso Primary Care Physician/Extender: Derrill Memo, Meindert Physician: Referring Physician: Lorelee Market Weeks in Treatment: 24 History of Present Illness HPI Description: 04/06/16; this is a 82 year old woman who arrives accompanied by 2 daughters for a wound on her left ankle and her left fifth toe. These have apparently been present for a year. I'm not quite certain how she came to this clinic however she was being followed by Sharlotte Alamo her podiatrist for these wounds. She was also referred to [facility] and they apparently did a test presumably arterial studies although we don't have any of these results and we couldn't get through to the office today. The family but has been applying a combination of Bactroban and a light bandage and perhaps more recently Silvadene cream. She did have an x-ray of the foot roughly 6 months ago at the podiatry office the family was unaware that if there were any abnormalities. Apparently they have not seen any healing here. Our intake nurse noted a slight skin tear on the right anterior lower leg. Nobody  seemed aware of this. ABIs calculated in this clinic was 0.3 on the right and 0.4 on the left I have reviewed things [facility]. There is very little information on this patient. She apparently follows in current total clinic which we don't have information from. She has mentioned already been to a [facility]. She has a history of hypothyroidism, nephrolithiasis arthritis and has had a previous mastectomy. 04/13/16; patient's x-ray was normal. She is already  been to see Dr. Delana Meyer vascular surgery. Her arterial exam was from November 2016 this showed a left ABI of 0.62 her right of 0.76. Her duplex ultrasound of the left leg showed biphasic waves in the common femoral and distal femoral artery however monophasic waves in the superficial femoral artery proximal and mid biphasic it distal. 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 The patient tells me that she has pain at night when she tries to lie down which is improved by getting up and sitting in the chair this sounds like claudication at rest. Her wounds are on the left medial malleolus and the dorsal fifth toe small punched out wounds that are right on bone. We use Santyl last week 04/20/16: nurse informed me pt has declined evaluation for significant PAD. she denies systemic s/s of infections. 04/27/16;; the patient has had noninvasive arterial studies done in November 2016. ABI and the left was 0.62. Monophasic waves at the superficial femoral artery. Occluded to the posterior tibial artery. So had greater than 50% stenosis of the right superficial femoral and greater than 50% stenosis of the left superficial femoral artery. She had bilateral tibial peroneal artery disease. Both of the wounds on the left fifth toe and left lateral malleolus have been present for more than a year. They have been to see vein and vascular. The patient has some pain but miraculously I think the wounds have largely been stable. No evidence of infection 05/04/16;  she goes for a noninvasive study tomorrow and then sees Dr. Fletcher Anon on Monday. By the time she is here next week we should have a better picture of whether something can be done with regards to her arterial flow. We continue to have ischemic-looking wounds on the left fifth toe and left lateral malleolus. 05/10/16; the patient went for her arterial studies and saw Dr. Fletcher Anon. As predicted she is felt to have critical Pallett, Angelyna ([id number]) limb ischemia. The feeling is that she has occlusion of the SFA. The feeling would she would be a candidate for a stent to the SFA. The patient did not make a decision to proceed with the procedure and she is here with family members to discuss this me today. He shouldn't has a lot of pain and cannot sleep and rest well at night per her family. 05/24/16; the patient went and had a complex revascularization/angioplasty of the left superficial femoral artery followed by drug-coated balloon angioplasty and spots stenting. She tolerated the procedure well. She was recommended for dual antiplatelet drugs with Plavix and aspirin for at least a month. She went yesterday for I believe follow-up serial Dopplers and ABIs although I don't see these results. The patient is unfortunately complaining of a lot of pain in her bilateral lower legs below the knees from the ankle to the knees. She apparently was prescribed lidocaine and apparently put this on her legs instead of over the wound areas. This may have something to do with it however there is a lot of edema in her bilateral legs I was able to find her arterial studies from yesterday. The left ABI has improved up to 0.66 post left SFA stent. The bilateral great toe indices remain abnormal with the left being in the 0.25 range. Duplex ultrasound showed her left SFA stent is patent monophasic waveforms persist in the left leg 06/07/16; continued punched-out areas over the dorsal left fifth toe and left lateral malleolus.  No major improvement 06/28/16; the areas over her dorsal left fifth toe and left  lateral malleolus are covered in surface slough we are using Iodoflex 07/05/16. We have been using Iodoflex for 2-3 weeks now. I have not been debridement is because of pain 07/19/16 currently we have been using Iodoflex for roughly the past month. Previously we were unable to debride due to pain although today patient states that the pain is not nearly as severe as it has been in the past. In fact she rates this to be a 1 out of 10 and it worse to a to 10 with palpation of the wound. All and all her and her daughter feel like this is actually doing steadily better at this point in time. She is pleased with progress and we have been seeing her every 2 weeks. 08/02/16; this is a delightful 82 year old woman I have not seen in over a month. She has arterial insufficiency wounds remaining over the left lateral malleolus and the left fifth toe. With the help of Dr. Fletcher Anon we are able to get her revascularized on the left. The 2 wounds on the left have been making good progress and are definitely smaller especially the area over the left lateral malleolus. She is certainly in a lot less pain than she used to be although I still think there is some claudication type pain. Unfortunately she is developed a area on the right lateral foot which is a small open area but I think is threatened. I suspect a small ischemic areas well. Also on her right fifth toe there is an area that is not open but looks as though it is receiving too much pressure from footwear. Finally an area over her right malleolus although I don't think this is on its way to anything ominous 08/17/16 patient presents today for follow up evaluation she tells me that she is really doing fairly well from a pain standpoint compared to where she has been previous. She is tolerating the dressing changes without any complication. She continues to have discharge and  drainage however. 08-30-16 Ms. Fielden presents today with her daughter, she states that she continues to have intermittent shooting pains to the left lateral fifth toe at this site of seems to be a healed ulcer. She denies any other issues that her wound related since her last appointment. Her daughter states that she is in need of a tramadol refill at this time as she uses half a tablet at at bedtime to aid in sleep due to foot pain. Iodoflex has been used on all wounds in previous dressing changes. 09/13/16; the patient has ischemic wounds in her feet. The area over the left lateral malleolus is healed and the area over the left fifth toe looks improved.. She has an area on the lateral aspect of her right foot which is a small but deep wound. Finally she has a new open area on the medial aspect of the left medial malleolus. This is superficial L/19/17; the area over the left lateral malleolus remains healed. The area over the left fifth toe has a surface and she states the pain is better but I don't think this is completely closed. She has an area on the lateral [place], Ryonna ([id number]) aspect of her right foot is a small but deep wound. The new open area on the medial aspect of the left ankle was closed from last time. Electronic Signature(s) Signed: 09/27/2016 5:07:10 PM By: [name] MD Entered By: [name] on 09/27/2016 11:26:05 [name] ([id number]) -------------------------------------------------------------------------------- Physical Exam Details Patient Name: [name] Date  of Service: 09/27/2016 10:45 AM Medical Record Patient Account Number: [id number] Number: Treating RN: Ahmed Prima 14-Jan-1919 (82 y.o. Other Clinician: Date of Birth/Sex: Female) Treating Antonette Hendricks Primary Care Physician/Extender: Derrill Memo, Meindert Physician: Referring Physician: Lorelee Market Weeks in Treatment: 24 Constitutional Patient is  hypertensive.. Pulse regular and within target range for patient.Marland Kitchen Respirations regular, non-labored and within target range.. Temperature is normal and within the target range for the patient.. Patient's appearance is neat and clean. Appears in no acute distress. Well nourished and well developed.. Cardiovascular Pedal pulses absent bilaterally.. Notes Wound exam; on the left foot the left lateral ankle which wasn't original wound remains closed. On the left medial ankle and new wound from the last time she was here this is also closed. The area on the lateral aspect of her left fifth toe has a surface on this. I elected not to debridement this today I don't believe that this is fully closed although the open areas small and the surface is keeping this from being so painful oOn the right lateral foot is now her most substantive wound. This is small but probably 3 mm in depth. Debrided of necrotic material with a #3 curet. There is no evidence of infection Electronic Signature(s) Signed: 09/27/2016 5:07:10 PM By: [name] MD Entered By: [name] on 09/27/2016 11:27:49 [name] ([id number]) -------------------------------------------------------------------------------- Physician Orders Details Patient Name: [name] Date of Service: 09/27/2016 10:45 AM Medical Record Patient Account Number: [id number] Number: Treating RN: Ahmed Prima 1919-03-29 (82 y.o. Other Clinician: Date of Birth/Sex: Female) Treating Landan Fedie Primary Care Physician/Extender: Derrill Memo, Meindert Physician: Referring Physician: Vinson Moselle in Treatment: 24 Verbal / Phone Orders: Yes Clinician: Carolyne Fiscal, Debi Read Back and Verified: Yes Diagnosis Coding Wound Cleansing Wound #2 Left,Lateral Toe Fifth o Clean wound with Normal Saline. - for clinic use o Cleanse wound with mild soap and water o May Shower, gently pat wound dry prior to  applying new dressing. Wound #4 Right,Lateral Foot o Clean wound with Normal Saline. - for clinic use o Cleanse wound with mild soap and water o May Shower, gently pat wound dry prior to applying new dressing. Anesthetic Wound #2 Left,Lateral Toe Fifth o Topical Lidocaine 4% cream applied to wound bed prior to debridement - for clinic use Wound #4 Right,Lateral Foot o Topical Lidocaine 4% cream applied to wound bed prior to debridement - for clinic use Skin Barriers/Peri-Wound Care Wound #2 Left,Lateral Toe Fifth o Skin Prep Wound #4 Right,Lateral Foot o Skin Prep Primary Wound Dressing Wound #2 Left,Lateral Toe Fifth o Prisma Ag Wound #4 Right,Lateral Foot o Prisma Ag Secondary Dressing Coval, Vandella ([id number]) Wound #2 Left,Lateral Toe Fifth o Dry Gauze o Boardered Foam Dressing Wound #4 Right,Lateral Foot o Dry Gauze o Boardered Foam Dressing Dressing Change Frequency Wound #2 Left,Lateral Toe Fifth o Change dressing every other day. Wound #4 Right,Lateral Foot o Change dressing every other day. Follow-up Appointments Wound #2 Left,Lateral Toe Fifth o Return Appointment in 2 weeks. Wound #4 Right,Lateral Foot o Return Appointment in 2 weeks. Edema Control Wound #2 Left,Lateral Toe Fifth o Elevate legs to the level of the heart and pump ankles as often as possible o Other: - compression stockings Wound #4 Right,Lateral Foot o Elevate legs to the level of the heart and pump ankles as often as possible o Other: - compression stockings Additional Orders / Instructions Wound #2 Left,Lateral Toe Fifth o Increase protein intake. Wound #4 Right,Lateral Foot o Increase protein intake. Medications-please  add to medication list. Wound #2 Left,Lateral Toe Fifth o Other: - Vitamin C, Zinc, Multivitamins Tramadol Wound #4 Right,Lateral Foot o Other: - Vitamin C, Zinc, Multivitamins Tramadol PEARLIA, GUDGER  ([id number]) Electronic Signature(s) Signed: 09/27/2016 4:44:26 PM By: Alric Quan Signed: 09/27/2016 5:07:10 PM By: [name] MD Entered By: Alric Quan on 09/27/2016 11:13:04 [name] ([id number]) -------------------------------------------------------------------------------- Problem List Details Patient Name: [name] Date of Service: 09/27/2016 10:45 AM Medical Record Patient Account Number: [id number] Number: Treating RN: Carolyne Fiscal, Debi 07-10-1919 (82 y.o. 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 Other Clinician: Date of Birth/Sex: Female) Treating Ashley Montminy Primary Care Physician/Extender: Derrill Memo, Meindert Physician: Referring Physician: Vinson Moselle in Treatment: 24 Subjective Chief  Complaint Information obtained from Patient here for follow-up on right lateral foot ulcer and left foot ulcers History of Present Illness (HPI) 04/06/16; this is a 82 year old woman who arrives accompanied by 2 daughters for a wound on her left ankle and her left fifth toe. These have apparently been present for a year. I'm not quite certain how she came to this clinic however she was being followed by Sharlotte Alamo her podiatrist for these wounds. She was also referred to [facility] and they apparently did a test presumably arterial studies although we don't have any of these results and we couldn't get through to the office today. The family but has been applying a combination of Bactroban and a light bandage and perhaps more recently Silvadene cream. She did have an x-ray of the foot roughly 6 months ago at the podiatry office the family was unaware that if there were any abnormalities. Apparently they have not seen any healing here. Our intake nurse noted a slight skin tear on the right anterior lower leg. Nobody seemed aware of this. ABIs calculated in this clinic was 0.3 on the right and 0.4 on the left I have reviewed things [facility]. There is very little information on this patient. She apparently follows in current total clinic which we don't have information from. She has mentioned already been to a [facility]. She has a history of hypothyroidism, nephrolithiasis arthritis and has had a previous mastectomy. 04/13/16; patient's x-ray was normal. She is already been to see Dr. Delana Meyer vascular surgery. Her arterial exam was from November 2016 this showed a left ABI of 0.62 her right of 0.76. Her duplex ultrasound of the left leg showed biphasic waves in the common femoral and distal femoral artery however monophasic waves in the superficial femoral artery proximal and mid biphasic it distal. 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 The patient tells me that she has pain  at night when she tries  to lie down which is improved by getting up and sitting in the chair this sounds like claudication at rest. Her wounds are on the left medial malleolus and the dorsal fifth toe small punched out wounds that are right on bone. We use Santyl last week 04/20/16: nurse informed me pt has declined evaluation for significant PAD. she denies systemic s/s of infections. 04/27/16;; the patient has had noninvasive arterial studies done in November 2016. ABI and the left was 0.62. Monophasic waves at the superficial femoral artery. Occluded to the posterior tibial artery. So had greater than 50% stenosis of the right superficial femoral and greater than 50% stenosis of the left superficial femoral artery. She had bilateral tibial peroneal artery disease. Both of the wounds on the left fifth toe and left lateral malleolus have been present for more than a year. They have been to see vein and Tillery, Dorianna ([id number]) vascular. The patient has some pain but miraculously I think the wounds have largely been stable. No evidence of infection 05/04/16; she goes for a noninvasive study tomorrow and then sees Dr. Fletcher Anon on Monday. By the time she is here next week we should have a better picture of whether something can be done with regards to her arterial flow. We continue to have ischemic-looking wounds on the left fifth toe and left lateral malleolus. 05/10/16; the patient went for her arterial studies and saw Dr. Fletcher Anon. As predicted she is felt to have critical limb ischemia. The feeling is that she has occlusion of the SFA. The feeling would she would be a candidate for a stent to the SFA. The patient did not make a decision to proceed with the procedure and she is here with family members to discuss this me today. He shouldn't has a lot of pain and cannot sleep and rest well at night per her family. 05/24/16; the patient went and had a complex revascularization/angioplasty of the left  superficial femoral artery followed by drug-coated balloon angioplasty and spots stenting. She tolerated the procedure well. She was recommended for dual antiplatelet drugs with Plavix and aspirin for at least a month. She went yesterday for I believe follow-up serial Dopplers and ABIs although I don't see these results. The patient is unfortunately complaining of a lot of pain in her bilateral lower legs below the knees from the ankle to the knees. She apparently was prescribed lidocaine and apparently put this on her legs instead of over the wound areas. This may have something to do with it however there is a lot of edema in her bilateral legs I was able to find her arterial studies from yesterday. The left ABI has improved up to 0.66 post left SFA stent. The bilateral great toe indices remain abnormal with the left being in the 0.25 range. Duplex ultrasound showed her left SFA stent is patent monophasic waveforms persist in the left leg 06/07/16; continued punched-out areas over the dorsal left fifth toe and left lateral malleolus. No major improvement 06/28/16; the areas over her dorsal left fifth toe and left lateral malleolus are covered in surface slough we are using Iodoflex 07/05/16. We have been using Iodoflex for 2-3 weeks now. I have not been debridement is because of pain 07/19/16 currently we have been using Iodoflex for roughly the past month. Previously we were unable to debride due to pain although today patient states that the pain is not nearly as severe as it has been in the past. In fact she rates this to be  a 1 out of 10 and it worse to a to 10 with palpation of the wound. All and all her and her daughter feel like this is actually doing steadily better at this point in time. She is pleased with progress and we have been seeing her every 2 weeks. 08/02/16; this is a delightful 82 year old woman I have not seen in over a month. She has arterial insufficiency wounds remaining  over the left lateral malleolus and the left fifth toe. With the help of Dr. Fletcher Anon we are able to get her revascularized on the left. The 2 wounds on the left have been making good progress and are definitely smaller especially the area over the left lateral malleolus. She is certainly in a lot less pain than she used to be although I still think there is some claudication type pain. Unfortunately she is developed a area on the right lateral foot which is a small open area but I think is threatened. I suspect a small ischemic areas well. Also on her right fifth toe there is an area that is not open but looks as though it is receiving too much pressure from footwear. Finally an area over her right malleolus although I don't think this is on its way to anything ominous 08/17/16 patient presents today for follow up evaluation she tells me that she is really doing fairly well from a pain standpoint compared to where she has been previous. She is tolerating the dressing changes without any complication. She continues to have discharge and drainage however. 08-30-16 Ms. Krawczyk presents today with her daughter, she states that she continues to have intermittent shooting pains to the left lateral fifth toe at this site of seems to be a healed ulcer. She denies any other issues that her wound related since her last appointment. Her daughter states that she is in need of a tramadol refill at this time as she uses half a tablet at at bedtime to aid in sleep due to foot pain. Iodoflex has been used on all wounds in previous dressing changes. LORRAE, HAYWOOD ([id number]) 09/13/16; the patient has ischemic wounds in her feet. The area over the left lateral malleolus is healed and the area over the left fifth toe looks improved.. She has an area on the lateral aspect of her right foot which is a small but deep wound. Finally she has a new open area on the medial aspect of the left medial malleolus. This is  superficial L/19/17; the area over the left lateral malleolus remains healed. The area over the left fifth toe has a surface and she states the pain is better but I don't think this is completely closed. She has an area on the lateral aspect of her right foot is a small but deep wound. The new open area on the medial aspect of the left ankle was closed from last time. Objective Constitutional Patient is hypertensive.. Pulse regular and within target range for patient.Marland Kitchen Respirations regular, non-labored and within target range.. Temperature is normal and within the target range for the patient.. Patient's appearance is neat and clean. Appears in no acute distress. Well nourished and well developed.. Vitals Time Taken: 10:33 AM, Height: 64 in, Weight: 158 lbs, BMI: 27.1, Temperature: 97.8 F, Pulse: 79 bpm, Respiratory Rate: 18 breaths/min, Blood Pressure: 156/51 mmHg. Cardiovascular Pedal pulses absent bilaterally.. General Notes: Wound exam; on the left foot the left lateral ankle which wasn't original wound remains closed. On the left medial ankle and new wound  from the last time she was here this is also closed. The area on the lateral aspect of her left fifth toe has a surface on this. I elected not to debridement this today I don't believe that this is fully closed although the open areas small and the surface is keeping this from being so painful On the right lateral foot is now her most substantive wound. This is small but probably 3 mm in depth. Debrided of necrotic material with a #3 curet. There is no evidence of infection Integumentary (Hair, Skin) Wound #2 status is Open. Original cause of wound was Gradually Appeared. The wound is located on the Left,Lateral Toe Fifth. The wound measures 0.1cm length x 0.1cm width x 0.1cm depth; 0.008cm^2 area and 0.001cm^3 volume. The wound is limited to skin breakdown. There is no tunneling or undermining noted. There is a medium amount of  serous drainage noted. The wound margin is thickened. There is no granulation within the wound bed. There is a large (67-100%) amount of necrotic tissue within the wound bed including Adherent Slough. The periwound skin appearance exhibited: Localized Edema, Maceration, Moist, Mottled. The periwound skin appearance did not exhibit: Callus, Crepitus, Excoriation, Fluctuance, Friable, Induration, Rash, Scarring, Dry/Scaly, Atrophie Blanche, Cyanosis, Ecchymosis, Hemosiderin Staining, Pallor, Rubor, Erythema. Periwound temperature was noted as No Abnormality. The periwound has tenderness on palpation. Wound #4 status is Open. Original cause of wound was Gradually Appeared. The wound is located on the Right,Lateral Foot. The wound measures 0.3cm length x 0.3cm width x 0.2cm depth; 0.071cm^2 area and Modica, Leisl ([id number]) 0.014cm^3 volume. The wound is limited to skin breakdown. There is no tunneling or undermining noted. There is a large amount of serous drainage noted. The wound margin is distinct with the outline attached to the wound base. There is no granulation within the wound bed. There is a large (67-100%) amount of necrotic tissue within the wound bed including Adherent Slough. The periwound skin appearance exhibited: Moist. Periwound temperature was noted as No Abnormality. The periwound has tenderness on palpation. Wound #5 status is Open. Original cause of wound was Gradually Appeared. The wound is located on the Left,Medial Malleolus. The wound measures 0cm length x 0cm width x 0cm depth; 0cm^2 area and 0cm^3 volume. Wound #5 status is Open. Original cause of wound was Gradually Appeared. The wound is located on the Left,Medial Malleolus. The wound measures 0cm length x 0cm width x 0cm depth; 0cm^2 area and 0cm^3 volume. The wound is limited to skin breakdown. There is no tunneling or undermining noted. There is a none present amount of drainage noted. There is no granulation  within the wound bed. There is no necrotic tissue within the wound bed. The periwound skin appearance did not exhibit: Moist. 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 There was a Skin/Subcutaneous Tissue Debridement [id number]) debridement with total area of 0.09 sq cm performed by Ricard Dillon, MD. with the following instrument(s): Curette to remove Viable and Non-Viable tissue/material including Exudate, Fibrin/Slough, and Subcutaneous after achieving pain control using Lidocaine 4% Topical Solution. A time out was  conducted at 11:08, prior to the start of the procedure. A Minimum amount of bleeding was controlled with Pressure. The procedure was tolerated well with a pain level of 0 throughout and a pain level of 0 following the procedure. Post Debridement Measurements: 0.3cm length x 0.3cm width x 0.3cm depth; 0.021cm^3 volume. Character of Wound/Ulcer Post Debridement requires further debridement. Severity of Tissue Post Debridement is: Fat layer exposed. Post procedure Diagnosis Wound #4: Same as Pre-Procedure Zenker, Milia ([id number]) Plan Wound Cleansing: Wound #2 Left,Lateral Toe Fifth: Clean wound with Normal Saline. - for clinic use Cleanse wound with mild soap and water May Shower, gently pat wound dry prior to applying new dressing. Wound #4 Right,Lateral Foot: Clean wound with Normal Saline. - for clinic use Cleanse wound with mild soap and water May Shower, gently pat wound dry prior to applying new  dressing. Anesthetic: Wound #2 Left,Lateral Toe Fifth: Topical Lidocaine 4% cream applied to wound bed prior to debridement - for clinic use Wound #4 Right,Lateral Foot: Topical Lidocaine 4% cream applied to wound bed prior to debridement - for clinic use Skin Barriers/Peri-Wound Care: Wound #2 Left,Lateral Toe Fifth: Skin Prep Wound #4 Right,Lateral Foot: Skin Prep Primary Wound Dressing: Wound #2 Left,Lateral Toe Fifth: Prisma Ag Wound #4 Right,Lateral Foot: Prisma Ag Secondary Dressing: Wound #2 Left,Lateral Toe Fifth: Dry Gauze Boardered Foam Dressing Wound #4 Right,Lateral Foot: Dry Gauze Boardered Foam Dressing Dressing Change Frequency: Wound #2 Left,Lateral Toe Fifth: Change dressing every other day. Wound #4 Right,Lateral Foot: Change dressing every other day. Follow-up Appointments: Wound #2 Left,Lateral Toe Fifth: Return Appointment in 2 weeks. Wound #4 Right,Lateral Foot: Return Appointment in 2 weeks. Edema Control: Wound #2 Left,Lateral Toe Fifth: Elevate legs to the level of the heart and pump ankles as often as possible Other: - compression stockings Behnke, Chessica ([id number]) Wound #4 Right,Lateral Foot: Elevate legs to the level of the heart and pump ankles as often as possible Other: - compression stockings Additional Orders / Instructions: Wound #2 Left,Lateral Toe Fifth: Increase protein intake. Wound #4 Right,Lateral Foot: Increase protein intake. Medications-please add to medication list.: Wound #2 Left,Lateral Toe Fifth: Other: - Vitamin C, Zinc, Multivitamins Tramadol Wound #4 Right,Lateral Foot: Other: - Vitamin C, Zinc, Multivitamins Tramadol o #1 the areas that are still open on the left fifth toe and the right lateral foot. We use Prisma to both of these which seems to of work for her #2 significant ischemia. I think she follows with Dr. Fletcher Anon in the new year. He had previously revascularize the left leg. #3 I think the patient  has rest claudication. I gave her some Ultram which is helped this. 50 mg by mouth every 6 hours when necessary #40 Electronic Signature(s) Signed: 09/27/2016 5:07:10 PM By: [name] MD Entered By: [name] on 09/27/2016 11:31:22 [name] ([id number]) -------------------------------------------------------------------------------- SuperBill Details Patient Name: [name] Date of Service: 09/27/2016 Medical Record Patient Account Number: [id number] Number: Treating RN: Ahmed Prima Aug 11, 1919 (82 y.o. 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

## 2016-09-28 NOTE — Progress Notes (Signed)
ROSEA, SCHEURER ([id number]) Visit Report for 09/27/2016 Arrival Information Details Patient Name: [name], [name] Date of Service: 09/27/2016 10:45 AM Medical Record Patient Account Number: [id number] Number: Treating RN: Ahmed Prima Feb 08, 1919 (82 y.o. Other Clinician: Date of Birth/Sex: Female) Treating ROBSON, [place] Primary Care Physician: Lorelee Market Physician/Extender: G Referring Physician: Vinson Moselle in Treatment: 24 Visit Information History Since Last Visit All ordered tests and consults were completed: No Patient Arrived: Gilford Rile Added or deleted any medications: No Arrival Time: 10:30 Any new allergies or adverse reactions: No Accompanied By: daughter Had a fall or experienced change in No Transfer Assistance: EasyP[employer] Patient activities of daily living that may affect Lift risk of falls: Patient Identification Verified: Yes Signs or symptoms of abuse/neglect since last No Secondary Verification Process Yes visito Completed: Hospitalized since last visit: No Patient Requires Transmission- No Has Dressing in Place as Prescribed: Yes Based Precautions: Pain Present Now: No Patient Has Alerts: Yes Patient Alerts: 7/31 ABI: R-0.55 L-0.40 [facility] Electronic Signature(s) Signed: 09/27/2016 4:44:26 PM By: Alric Quan Entered By: Alric Quan on 09/27/2016 10:33:36 [name] ([id number]) -------------------------------------------------------------------------------- Encounter Discharge Information Details Patient Name: [name] Date of Service: 09/27/2016 10:45 AM Medical Record Patient Account Number: [id number] Number: Treating RN: Carolyne Fiscal, Debi 1918-10-30 (82 y.o. Other Clinician: Date of Birth/Sex: Female) Treating ROBSON, [place] Primary Care Physician: Lorelee Market Physician/Extender: G Referring Physician: Vinson Moselle in Treatment:  24 Encounter Discharge Information Items Discharge Pain Level: 0 Discharge Condition: Stable Ambulatory Status: Walker Discharge Destination: Home Transportation: Private Auto Accompanied By: daughter Schedule Follow-up Appointment: Yes Medication Reconciliation completed and provided to Patient/Care Yes Brianny Soulliere: Provided on Clinical Summary of Care: 09/27/2016 Form Type Recipient Paper Patient [name] Electronic Signature(s) Signed: 09/27/2016 11:32:19 AM By: Ruthine Dose Entered By: Ruthine Dose on 09/27/2016 11:32:19 [name] ([id number]) -------------------------------------------------------------------------------- Lower Extremity Assessment Details Patient Name: [name] Date of Service: 09/27/2016 10:45 AM Medical Record Patient Account Number: [id number] Number: Treating RN: Ahmed Prima 1918/10/23 (82 y.o. Other Clinician: Date of Birth/Sex: Female) Treating ROBSON, [place] Primary Care Physician: Lorelee Market Physician/Extender: G Referring Physician: Lorelee Market Weeks in Treatment: 24 Vascular Assessment Pulses: Dorsalis Pedis Palpable: [Left:Yes] [Right:Yes] Posterior Tibial Extremity colors, hair growth, and conditions: Extremity Color: [Left:Red] [Right:Red] Temperature of Extremity: [Left:Warm] [Right:Warm] Capillary Refill: [Left:< 3 seconds] [Right:< 3 seconds] Toe Nail Assessment Left: Right: Thick: No No Discolored: Yes Yes Deformed: No No Improper Length and Hygiene: No No Electronic Signature(s) Signed: 09/27/2016 4:44:26 PM By: Alric Quan Entered By: Alric Quan on 09/27/2016 10:38:43 [name] ([id number]) -------------------------------------------------------------------------------- Multi Wound Chart Details Patient Name: [name] Date of Service: 09/27/2016 10:45 AM Medical Record Patient Account Number: [id number] Number: Treating RN: Carolyne Fiscal,  Debi 1919-09-25 (82 y.o. 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 Comorbid History: Cataracts, Hypertension, Cataracts, Hypertension, N/A Osteoarthritis Osteoarthritis Date Acquired: 04/07/2015 07/26/2016 09/13/2016 Weeks of Treatment: 24 8 0 Wound Status: Open Open Open Measurements L x W x D 0.1x0.1x0.1 0.3x0.3x0.2 0x0x0 (cm) Area (cm) : 0.008 0.071 0 Volume (cm) : 0.001 0.014 0 % Reduction in Area:  83.00% -51.10% 100.00% % Reduction in Volume: 88.90% -180.00% 100.00% Classification: Partial Thickness Partial Thickness Partial Thickness Exudate Amount: Medium Large N/A Exudate Type: Serous Serous N/A Exudate Color: amber amber N/A Wound Margin: Thickened Distinct, outline attached N/A Granulation Amount: None Present (0%) None Present (0%) N/A Necrotic Amount: Large (67-100%) Large (67-100%) N/A Mcbroom, Hiya ([id number]) Exposed Structures: Fascia: No Fascia: No N/A Fat: No Fat: No Tendon: No Tendon: No Muscle: No Muscle: No Joint: No Joint: No Bone: No Bone: No Limited to Skin Limited to Skin Breakdown Breakdown Epithelialization: None None N/A Debridement: N/A Debridement [id number]) Pre-procedure N/A 11:08 N/A Verification/Time Out Taken: Pain Control: N/A Lidocaine 4% Topical N/A Solution Tissue Debrided: N/A Fibrin/Slough, Exudates,  N/A Subcutaneous Level: N/A Skin/Subcutaneous N/A Tissue Debridement Area (sq N/A 0.09 N/A cm): Instrument: N/A Curette N/A Bleeding: N/A Minimum N/A Hemostasis Achieved: N/A Pressure N/A Procedural Pain: N/A 0 N/A Post Procedural Pain: N/A 0 N/A Debridement Treatment N/A Procedure was tolerated N/A Response: well Post Debridement N/A 0.3x0.3x0.3 N/A Measurements L x W x D (cm) Post Debridement N/A 0.021 N/A Volume: (cm) Periwound Skin Texture: Edema: Yes No Abnormalities Noted No Abnormalities Noted Excoriation: No Induration: No Callus: No Crepitus: No Fluctuance: No Friable: No Rash: No Scarring: No Periwound Skin Maceration: Yes Moist: Yes No Abnormalities Noted Moisture: Moist: Yes Dry/Scaly: No Periwound Skin Color: Mottled: Yes No Abnormalities Noted No Abnormalities Noted Atrophie Blanche: No Cyanosis: No Ecchymosis: No Stigger, Annya ([id number]) Erythema: No Hemosiderin Staining: No Pallor: No Rubor: No Temperature: No Abnormality No Abnormality N/A Tenderness on Yes Yes No Palpation: Wound Preparation: Ulcer Cleansing: Ulcer Cleansing: N/A Rinsed/Irrigated with Rinsed/Irrigated with Saline Saline Topical Anesthetic Topical Anesthetic Applied: Other: lidocaine Applied: Other: lidocaine 4% 4% Procedures Performed: N/A Debridement N/A Wound Number: 5 N/A N/A Photos: N/A N/A Wound Location: Left Malleolus - Medial N/A N/A Wounding Event: Gradually Appeared N/A N/A Primary Etiology: Arterial Insufficiency Ulcer N/A N/A Comorbid History: Cataracts, Hypertension, N/A N/A Osteoarthritis Date Acquired: 09/13/2016 N/A N/A Weeks of Treatment: 0 N/A N/A Wound Status: Open N/A N/A Measurements L x W x D 0x0x0 N/A N/A (cm) Area (cm) : 0 N/A N/A Volume (cm) : 0 N/A N/A % Reduction in Area: N/A N/A N/A % Reduction in Volume: N/A N/A N/A Classification: Partial Thickness N/A N/A Exudate Amount: None Present N/A N/A Exudate Type: N/A N/A  N/A Exudate Color: N/A N/A N/A Wound Margin: N/A N/A N/A Granulation Amount: None Present (0%) N/A N/A Necrotic Amount: None Present (0%) N/A N/A Exposed Structures: Fascia: No N/A N/A Fat: No Tendon: No Muscle: No Star, Orah ([id number]) Joint: No Bone: No Limited to Skin Breakdown Epithelialization: Large (67-100%) N/A N/A Debridement: N/A N/A N/A Pain Control: N/A N/A N/A Tissue Debrided: N/A N/A N/A Level: N/A N/A N/A Debridement Area (sq N/A N/A N/A cm): Instrument: N/A N/A N/A Bleeding: N/A N/A N/A Hemostasis Achieved: N/A N/A N/A Procedural Pain: N/A N/A N/A Post Procedural Pain: N/A N/A N/A Debridement Treatment N/A N/A N/A Response: Post Debridement N/A N/A N/A Measurements L x W x D (cm) Post Debridement N/A N/A N/A Volume: (cm) Periwound Skin Texture: No Abnormalities Noted N/A N/A Periwound Skin Moist: No N/A N/A Moisture: Periwound Skin Color: No Abnormalities Noted N/A N/A Temperature: N/A N/A N/A Tenderness on No N/A N/A Palpation: Wound Preparation: Ulcer Cleansing: N/A N/A Rinsed/Irrigated with Saline Topical Anesthetic Applied: None Procedures Performed: N/A N/A N/A Treatment Notes Wound #2 (Left, Lateral Toe Fifth) 1. Cleansed with: Clean wound with Normal  Saline 2. Anesthetic Topical Lidocaine 4% cream to wound bed prior to debridement 3. Peri-wound Care: Skin Prep 4. Dressing Applied: Xochil Belko, Texie ([id number]) 5. Secondary Dressing Applied Bordered Foam Dressing Dry Gauze Wound #4 (Right, Lateral Foot) 1. Cleansed with: Clean wound with Normal Saline 2. Anesthetic Topical Lidocaine 4% cream to wound bed prior to debridement 3. Peri-wound Care: Skin Prep 4. Dressing Applied: Prisma Ag 5. Secondary Dressing Applied Bordered Foam Dressing Dry Gauze Electronic Signature(s) Signed: 09/27/2016 5:07:10 PM By: [name] MD Entered By: [name] on 09/27/2016 11:20:24 [name]  ([id number]) -------------------------------------------------------------------------------- Multi-Disciplinary Care Plan Details Patient Name: [name] Date of Service: 09/27/2016 10:45 AM Medical Record Patient Account Number: [id number] Number: Treating RN: Ahmed Prima [birth year] (82 y.o. Other Clinician: Date of Birth/Sex: Female) Treating ROBSON, [place] Primary Care Physician: Lorelee Market Physician/Extender: G Referring Physician: Vinson Moselle in Treatment: 24 Active Inactive Abuse / Safety / Falls / Self Care Management Nursing Diagnoses: Potential for falls Goals: Patient will remain injury free Date Initiated: 04/06/2016 Goal Status: Active Interventions: Assess fall risk on admission and as needed Notes: Nutrition Nursing Diagnoses: Imbalanced nutrition Goals: Patient/caregiver agrees to and verbalizes understanding of need to use nutritional supplements and/or vitamins as prescribed Date Initiated: 04/06/2016 Goal Status: Active Interventions: Assess patient nutrition upon admission and as needed per policy Notes: Orientation to the Wound Care Program Nursing Diagnoses: Knowledge deficit related to the wound healing center program SHANITA, PO ([id number]) Goals: Patient/caregiver will verbalize understanding of the [place] Program Date Initiated: 04/06/2016 Goal Status: Active Interventions: Provide education on orientation to the wound center Notes: Pain, Acute or Chronic Nursing Diagnoses: Pain, acute or chronic: actual or potential Potential alteration in comfort, pain Goals: Patient will verbalize adequate pain control and receive pain control interventions during procedures as needed Date Initiated: 04/06/2016 Goal Status: Active Interventions: Assess comfort goal upon admission Complete pain assessment as per visit requirements Notes: Wound/Skin Impairment Nursing Diagnoses: Impaired  tissue integrity Goals: Ulcer/skin breakdown will have a volume reduction of 30% by week 4 Date Initiated: 04/06/2016 Goal Status: Active Ulcer/skin breakdown will have a volume reduction of 50% by week 8 Date Initiated: 04/06/2016 Goal Status: Active Ulcer/skin breakdown will have a volume reduction of 80% by week 12 Date Initiated: 04/06/2016 Goal Status: Active Interventions: Assess patient/caregiver ability to obtain necessary supplies [place], Safi[place]ah ([id number]) Assess ulceration(s) every visit Notes: Electronic Signature(s) Signed: 09/27/2016 4:44:26 PM By: Alric Quan Entered By: Alric Quan on 09/27/2016 11:09:31 [name] ([id number]) -------------------------------------------------------------------------------- Pain Assessment Details Patient Name: [name] Date of Service: 09/27/2016 10:45 AM Medical Record Patient Account Number: [id number] Number: Treating RN: Ahmed Prima 1919-07-07 (82 y.o. Other Clinician: Date of Birth/Sex: Female) Treating ROBSON, [place] Primary Care Physician: Lorelee Market Physician/Extender: G Referring Physician: Lorelee Market Weeks in Treatment: 24 Active Problems Location of Pain Severity and Description of Pain Patient Has Paino No Site Locations With Dressing Change: No Pain Management and Medication Current Pain Management: Electronic Signature(s) Signed: 09/27/2016 4:44:26 PM By: Alric Quan Entered By: Alric Quan on 09/27/2016 10:33:41 [name] ([id number]) -------------------------------------------------------------------------------- Patient/Caregiver Education Details Patient Name: [name] Date of Service: 09/27/2016 10:45 AM Medical Record Patient Account Number: [id number] Number: Treating RN: Carolyne Fiscal, Debi 1918-11-08 (82 y.o. Other Clinician: Date of Birth/Gender: Female) Treating ROBSON, [place] Primary Care Physician:  Lorelee Market Physician/Extender: G Referring Physician: Vinson Moselle in Treatment: 24 Education Assessment Education Provided To: Patient Education Topics Provided Wound/Skin Impairment: Handouts: Other: change dressing as  ordered Methods: Demonstration, Explain/Verbal Responses: State content correctly Electronic Signature(s) Signed: 09/27/2016 4:44:26 PM By: Alric Quan Entered By: Alric Quan on 09/27/2016 11:14:05 [name] ([id number]) -------------------------------------------------------------------------------- Wound Assessment Details Patient Name: [name] Date of Service: 09/27/2016 10:45 AM Medical Record Patient Account Number: [id number] Number: Treating RN: Ahmed Prima [birth year] (82 y.o. Other Clinician: Date of Birth/Sex: Female) Treating ROBSON, [place] Primary Care Physician: Lorelee Market Physician/Extender: G Referring Physician: Lorelee Market Weeks in Treatment: 24 Wound Status Wound Number: 2 Primary Arterial Insufficiency Ulcer Etiology: Wound Location: Left Toe Fifth - Lateral Wound Status: Open Wounding Event: Gradually Appeared Comorbid Cataracts, Hypertension, Date Acquired: 04/07/2015 History: Osteoarthritis Weeks Of Treatment: 24 Clustered Wound: No Photos Photo Uploaded By: Alric Quan on 09/27/2016 10:49:06 Wound Measurements Length: (cm) 0.1 % Reduction in Width: (cm) 0.1 % Reduction in Depth: (cm) 0.1 Epithelializati Area: (cm) 0.008 Tunneling: Volume: (cm) 0.001 Undermining: Area: 83% Volume: 88.9% on: None No No Wound Description Classification: Partial Thickness Foul Odor Afte Wound Margin: Thickened Exudate Amount: Medium Exudate Type: Serous Exudate Color: amber r Cleansing: No Wound Bed Granulation Amount: None Present (0%) Exposed Structure Necrotic Amount: Large (67-100%) Fascia Exposed: No Necrotic Quality: Adherent Slough Fat Layer  Exposed: No Zheng, Samaira ([id number]) Tendon Exposed: No Muscle Exposed: No Joint Exposed: No Bone Exposed: No Limited to Skin Breakdown Periwound Skin Texture Texture Color No Abnormalities Noted: No No Abnormalities Noted: No Callus: No Atrophie Blanche: No Crepitus: No Cyanosis: No Excoriation: No Ecchymosis: No Fluctuance: No Erythema: No Friable: No Hemosiderin Staining: No Induration: No Mottled: Yes Localized Edema: Yes Pallor: No Rash: No Rubor: No Scarring: No Temperature / Pain Moisture Temperature: No Abnormality No Abnormalities Noted: No Tenderness on Palpation: Yes Dry / Scaly: No Maceration: Yes Moist: Yes Wound Preparation Ulcer Cleansing: Rinsed/Irrigated with Saline Topical Anesthetic Applied: Other: lidocaine 4%, Treatment Notes Wound #2 (Left, Lateral Toe Fifth) 1. Cleansed with: Clean wound with Normal Saline 2. Anesthetic Topical Lidocaine 4% cream to wound bed prior to debridement 3. Peri-wound Care: Skin Prep 4. Dressing Applied: Prisma Ag 5. Secondary Dressing Applied Bordered Foam Dressing Dry Gauze Electronic Signature(s) Signed: 09/27/2016 4:44:26 PM By: Alric Quan Entered By: Alric Quan on 09/27/2016 10:43:11 CHASSY, NORED ([id number]) [place], Estill Bamberg ([id number]) -------------------------------------------------------------------------------- Wound Assessment Details Patient Name: [name] Date of Service: 09/27/2016 10:45 AM Medical Record Patient Account Number: [id number] Number: Treating RN: Ahmed Prima 05-29-1919 (82 y.o. Other Clinician: Date of Birth/Sex: Female) Treating ROBSON, [place] Primary Care Physician: Lorelee Market Physician/Extender: G Referring Physician: Lorelee Market Weeks in Treatment: 24 Wound Status Wound Number: 4 Primary Arterial Insufficiency Ulcer Etiology: Wound Location: Right Foot - Lateral Wound Status: Open Wounding Event: Gradually  Appeared Comorbid Cataracts, Hypertension, Date Acquired: 07/26/2016 History: Osteoarthritis Weeks Of Treatment: 8 Clustered Wound: No Photos Photo Uploaded By: Alric Quan on 09/27/2016 10:49:07 Wound Measurements Length: (cm) 0.3 Width: (cm) 0.3 Depth: (cm) 0.2 Area: (cm) 0.071 Volume: (cm) 0.014 % Reduction in Area: -51.1% % Reduction in Volume: -180% Epithelialization: None Tunneling: No Undermining: No Wound Description Classification: Partial Thickness Wound Margin: Distinct, outline attached Exudate Amount: Large Exudate Type: Serous Exudate Color: amber Foul Odor After Cleansing: No Wound Bed Granulation Amount: None Present (0%) Exposed Structure Necrotic Amount: Large (67-100%) Fascia Exposed: No Necrotic Quality: Adherent Slough Fat Layer Exposed: No Haislip, Hatsumi ([id number]) Tendon Exposed: No Muscle Exposed: No Joint Exposed: No Bone Exposed: No Limited to Skin Breakdown Periwound Skin Texture Texture Color No Abnormalities Noted: No No Abnormalities Noted: No Moisture Temperature /  Pain No Abnormalities Noted: No Temperature: No Abnormality Moist: Yes Tenderness on Palpation: Yes Wound Preparation Ulcer Cleansing: Rinsed/Irrigated with Saline Topical Anesthetic Applied: Other: lidocaine 4%, Treatment Notes Wound #4 (Right, Lateral Foot) 1. Cleansed with: Clean wound with Normal Saline 2. Anesthetic Topical Lidocaine 4% cream to wound bed prior to debridement 3. Peri-wound Care: Skin Prep 4. Dressing Applied: Prisma Ag 5. Secondary Dressing Applied Bordered Foam Dressing Dry Gauze Electronic Signature(s) Signed: 09/27/2016 4:44:26 PM By: Alric Quan Entered By: Alric Quan on 09/27/2016 10:42:43 [name] ([id number]) -------------------------------------------------------------------------------- Wound Assessment Details Patient Name: [name] Date of Service: 09/27/2016 10:45 AM Medical Record  Patient Account Number: [id number] Number: Treating RN: Ahmed Prima Dec 03, 1918 (82 y.o. Other Clinician: Date of Birth/Sex: Female) Treating ROBSON, [place] Primary Care Physician: Lorelee Market Physician/Extender: G Referring Physician: Lorelee Market Weeks in Treatment: 24 Wound Status Wound Number: 5 Primary Etiology: Arterial Insufficiency Ulcer Wound Location: Left, Medial Malleolus Wound Status: Open Wounding Event: Gradually Appeared Date Acquired: 09/13/2016 Weeks Of Treatment: 0 Clustered Wound: No Photos Wound Measurements Length: (cm) 0 Width: (cm) 0 Depth: (cm) 0 Area: (cm) 0 Volume: (cm) 0 % Reduction in Area: 100% % Reduction in Volume: 100% Wound Description Classification: Partial Thickness Periwound Skin Texture Texture Color No Abnormalities Noted: No No Abnormalities Noted: No Moisture No Abnormalities Noted: No Electronic Signature(s) Signed: 09/27/2016 4:44:26 PM By: Myriam Jacobson, [place] Bam[place] ([id number]) Entered By: Alric Quan on 09/27/2016 11:07:46 [name] ([id number]) -------------------------------------------------------------------------------- Wound Assessment Details Patient Name: [name] Date of Service: 09/27/2016 10:45 AM Medical Record Patient Account Number: [id number] Number: Treating RN: Carolyne Fiscal, Debi [birth year] (82 y.o. Other Clinician: Date of Birth/Sex: Female) Treating ROBSON, [place] Primary Care Physician: Lorelee Market Physician/Extender: G Referring Physician: Lorelee Market Weeks in Treatment: 24 Wound Status Wound Number: 5 Primary Arterial Insufficiency Ulcer Etiology: Wound Location: Left Malleolus - Medial Wound Status: Open Wounding Event: Gradually Appeared Comorbid Cataracts, Hypertension, Date Acquired: 09/13/2016 History: Osteoarthritis Weeks Of Treatment: 0 Clustered Wound: No Photos Wound Measurements Length: (cm) 0 %  Reduction in Width: (cm) 0 % Reduction in Depth: (cm) 0 Epithelializati Area: (cm) 0 Tunneling: Volume: (cm) 0 Undermining: Area: Volume: on: Large (67-100%) No No Wound Description Classification: Partial Thickness Foul Odor Afte Exudate Amount: None Present r Cleansing: No Wound Bed Granulation Amount: None Present (0%) Exposed Structure Necrotic Amount: None Present (0%) Fascia Exposed: No Fat Layer Exposed: No Tendon Exposed: No Muscle Exposed: No Joint Exposed: No Chiusano, Sui ([id number]) Bone Exposed: No Limited to Skin Breakdown Periwound Skin Texture Texture Color No Abnormalities Noted: No No Abnormalities Noted: No Moisture No Abnormalities Noted: No Moist: No Wound Preparation Ulcer Cleansing: Rinsed/Irrigated with Saline Topical Anesthetic Applied: None Electronic Signature(s) Signed: 09/27/2016 4:44:26 PM By: Alric Quan Entered By: Alric Quan on 09/27/2016 11:08:13 [name] ([id number]) -------------------------------------------------------------------------------- Vitals Details Patient Name: [name] Date of Service: 09/27/2016 10:45 AM Medical Record Patient Account Number: [id number] Number: Treating RN: Carolyne Fiscal, Debi 09-Mar-1919 (82 y.o. Other Clinician: Date of Birth/Sex: Female) Treating ROBSON, MICHAEL Primary Care Physician: Lorelee Market Physician/Extender: G Referring Physician: Lorelee Market Weeks in Treatment: 24 Vital Signs Time Taken: 10:33 Temperature (F): 97.8 Height (in): 64 Pulse (bpm): 79 Weight (lbs): 158 Respiratory Rate (breaths/min): 18 Body Mass Index (BMI): 27.1 Blood Pressure (mmHg): 156/51 Reference Range: 80 - 120 mg / dl Electronic Signature(s) Signed: 09/27/2016 4:44:26 PM By: Alric Quan Entered By: Alric Quan on 09/27/2016 10:37:42

## 2016-10-11 ENCOUNTER — Ambulatory Visit: Payer: Medicare PPO | Admitting: Internal Medicine

## 2016-10-18 ENCOUNTER — Encounter: Payer: Medicare PPO | Attending: Internal Medicine | Admitting: Internal Medicine

## 2016-10-18 DIAGNOSIS — I70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: Secondary | ICD-10-CM | POA: Diagnosis present

## 2016-10-18 DIAGNOSIS — L97512 Non-pressure chronic ulcer of other part of right foot with fat layer exposed: Secondary | ICD-10-CM | POA: Insufficient documentation

## 2016-10-18 DIAGNOSIS — M1990 Unspecified osteoarthritis, unspecified site: Secondary | ICD-10-CM | POA: Diagnosis not present

## 2016-10-18 DIAGNOSIS — I70243 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: Secondary | ICD-10-CM | POA: Diagnosis not present

## 2016-10-18 DIAGNOSIS — L97523 Non-pressure chronic ulcer of other part of left foot with necrosis of muscle: Secondary | ICD-10-CM | POA: Insufficient documentation

## 2016-10-18 DIAGNOSIS — E039 Hypothyroidism, unspecified: Secondary | ICD-10-CM | POA: Insufficient documentation

## 2016-10-19 NOTE — Progress Notes (Signed)
BRENISHA, KODER ([id number]) Visit Report for 10/18/2016 Arrival Information Details Patient Name: [name], [name] Date of Service: 10/18/2016 12:30 PM Medical Record Patient Account Number: [id number] Number: Treating RN: Ahmed Prima [birth year] (83 y.o. Other Clinician: Date of Birth/Sex: Female) Treating ROBSON, [place] Primary Care Physician: Lorelee Market Physician/Extender: G Referring Physician: Vinson Moselle in Treatment: 27 Visit Information History Since Last Visit All ordered tests and consults were completed: No Patient Arrived: Ambulatory Added or deleted any medications: No Arrival Time: 12:39 Any new allergies or adverse reactions: No Accompanied By: daughter Had a fall or experienced change in No Transfer Assistance: None activities of daily living that may affect Patient Identification Verified: Yes risk of falls: Secondary Verification Process Yes Signs or symptoms of abuse/neglect since last No Completed: visito Patient Requires Transmission- No Hospitalized since last visit: No Based Precautions: Pain Present Now: No Patient Has Alerts: Yes Patient Alerts: 7/31 ABI: R-0.55 L-0.40 [facility] Electronic Signature(s) Signed: 10/18/2016 4:34:43 PM By: Alric Quan Entered By: Alric Quan on 10/18/2016 12:43:15 [name] ([id number]) -------------------------------------------------------------------------------- Encounter Discharge Information Details Patient Name: [name] Date of Service: 10/18/2016 12:30 PM Medical Record Patient Account Number: [id number] Number: Treating RN: Ahmed Prima 07/10/1919 (83 y.o. Other Clinician: Date of Birth/Sex: Female) Treating ROBSON, [place] Primary Care Physician: Lorelee Market Physician/Extender: G Referring Physician: Vinson Moselle in Treatment: 27 Encounter Discharge Information Items Discharge Pain Level: 0 Discharge  Condition: Stable Ambulatory Status: Ambulatory Discharge Destination: Home Transportation: Private Auto Accompanied By: daughter Schedule Follow-up Appointment: Yes Medication Reconciliation completed and provided to Patient/Care Yes Nautika Cressey: Provided on Clinical Summary of Care: 10/18/2016 Form Type Recipient Paper Patient [facility] Electronic Signature(s) Signed: 10/18/2016 4:34:43 PM By: Alric Quan Previous Signature: 10/18/2016 1:31:49 PM Version By: Ruthine Dose Entered By: Alric Quan on 10/18/2016 15:42:48 [name] ([id number]) -------------------------------------------------------------------------------- Lower Extremity Assessment Details Patient Name: [name] Date of Service: 10/18/2016 12:30 PM Medical Record Patient Account Number: [id number] Number: Treating RN: Ahmed Prima [birth year] (83 y.o. Other Clinician: Date of Birth/Sex: Female) Treating ROBSON, [place] Primary Care Physician: Lorelee Market Physician/Extender: G Referring Physician: Lorelee Market Weeks in Treatment: 27 Vascular Assessment Pulses: Dorsalis Pedis Palpable: [Left:Yes] [Right:Yes] Posterior Tibial Extremity colors, hair growth, and conditions: Extremity Color: [Left:Red] [Right:Red] Temperature of Extremity: [Left:Warm] [Right:Warm] Capillary Refill: [Left:< 3 seconds] [Right:< 3 seconds] Electronic Signature(s) Signed: 10/18/2016 4:34:43 PM By: Alric Quan Entered By: Alric Quan on 10/18/2016 15:39:05 [name] ([id number]) -------------------------------------------------------------------------------- Multi Wound Chart Details Patient Name: [name] Date of Service: 10/18/2016 12:30 PM Medical Record Patient Account Number: [id number] Number: Treating RN: Ahmed Prima 1919/05/20 (83 y.o. Other Clinician: Date of Birth/Sex: Female) Treating ROBSON, [place] Primary Care Physician: Lorelee Market Physician/Extender: G Referring Physician: Lorelee Market Weeks in Treatment: 27 Vital Signs Height(in): 64 Pulse(bpm): 74 Weight(lbs): 158 Blood Pressure 156/52 (mmHg): Body Mass Index(BMI): 27 Temperature(F): 98.0 Respiratory Rate 16 (breaths/min): Photos: [2:No Photos] [4:No Photos] [N/A:N/A] Wound Location: [2:Left Toe Fifth - Lateral] [4:Right Foot - Lateral] [N/A:N/A] Wounding Event: [2:Gradually Appeared] [4:Gradually Appeared] [N/A:N/A] Primary Etiology: [2:Arterial Insufficiency Ulcer Arterial Insufficiency Ulcer N/A] Comorbid History: [2:Cataracts, Hypertension, Cataracts, Hypertension, N/A Osteoarthritis] [4:Osteoarthritis] Date Acquired: [2:04/07/2015] [4:07/26/2016] [N/A:N/A] Weeks of Treatment: [2:27] [4:11] [N/A:N/A] Wound Status: [2:Open] [4:Open] [N/A:N/A] Measurements L x W x D 0.1x0.1x0.1 [4:0.4x0.6x0.2] [N/A:N/A] (cm) Area (cm) : [2:0.008] [4:0.188] [N/A:N/A] Volume (cm) : [2:0.001] [4:0.038] [N/A:N/A] % Reduction in Area: [2:83.00%] [4:-300.00%] [N/A:N/A] % Reduction in Volume: 88.90% [4:-660.00%] [N/A:N/A] Classification: [2:Partial Thickness] [4:Partial Thickness] [N/A:N/A] Exudate Amount: [2:Small] [4:Large] [  N/A:N/A] Exudate Type: [2:Serous] [4:Serous] [N/A:N/A] Exudate Color: [2:amber] [4:amber] [N/A:N/A] Wound Margin: [2:Thickened] [4:Distinct, outline attached N/A] Granulation Amount: [2:None Present (0%)] [4:None Present (0%)] [N/A:N/A] Necrotic Amount: [2:Large (67-100%)] [4:Large (67-100%)] [N/A:N/A] Exposed Structures: [2:Fascia: No Fat: No Tendon: No Muscle: No Joint: No Bone: No] [4:Fascia: No Fat: No Tendon: No Muscle: No Joint: No Bone: No] [N/A:N/A] Limited to Skin Limited to Skin Breakdown Breakdown Epithelialization: None None N/A Debridement: N/A Debridement [id number]) Pre-procedure N/A 13:11 N/A Verification/Time Out Taken: Pain Control: N/A Lidocaine 4% Topical N/A Solution Tissue Debrided: N/A  Fibrin/Slough, Exudates, N/A Subcutaneous Level: N/A Skin/Subcutaneous N/A Tissue Debridement Area (sq N/A 0.24 N/A cm): Instrument: N/A Curette N/A Bleeding: N/A Minimum N/A Hemostasis Achieved: N/A Pressure N/A Procedural Pain: N/A 0 N/A Post Procedural Pain: N/A 0 N/A Debridement Treatment N/A Procedure was tolerated N/A Response: well Post Debridement N/A 0.4x0.6x0.2 N/A Measurements L x W x D (cm) Post Debridement N/A 0.038 N/A Volume: (cm) Periwound Skin Texture: Edema: Yes No Abnormalities Noted N/A Excoriation: No Induration: No Callus: No Crepitus: No Fluctuance: No Friable: No Rash: No Scarring: No Periwound Skin Maceration: Yes Moist: Yes N/A Moisture: Moist: Yes Dry/Scaly: No Periwound Skin Color: Mottled: Yes No Abnormalities Noted N/A Atrophie Blanche: No Cyanosis: No Ecchymosis: No Erythema: No Hemosiderin Staining: No Pallor: No Rubor: No Temperature: No Abnormality No Abnormality N/A Yes Yes N/A Doiron, Taw[place] ([id number]) Tenderness on Palpation: Wound Preparation: Ulcer Cleansing: Ulcer Cleansing: N/A Rinsed/Irrigated with Rinsed/Irrigated with Saline Saline Topical Anesthetic Topical Anesthetic Applied: Other: lidocaine Applied: Other: lidocaine 4% 4% Procedures Performed: N/A Debridement N/A Treatment Notes Electronic Signature(s) Signed: 10/18/2016 4:34:43 PM By: Alric Quan Entered By: Alric Quan on 10/18/2016 15:39:20 [name] ([id number]) -------------------------------------------------------------------------------- Multi-Disciplinary Care Plan Details Patient Name: [name] Date of Service: 10/18/2016 12:30 PM Medical Record Patient Account Number: [id number] Number: Treating RN: Ahmed Prima 1919/08/26 (83 y.o. Other Clinician: Date of Birth/Sex: Female) Treating ROBSON, [place] Primary Care Physician: Lorelee Market Physician/Extender: G Referring Physician: Vinson Moselle in Treatment: 27 Active Inactive Abuse / Safety / Falls / Self Care Management Nursing Diagnoses: Potential for falls Goals: Patient will remain injury free Date Initiated: 04/06/2016 Goal Status: Active Interventions: Assess fall risk on admission and as needed Notes: Nutrition Nursing Diagnoses: Imbalanced nutrition Goals: Patient/caregiver agrees to and verbalizes understanding of need to use nutritional supplements and/or vitamins as prescribed Date Initiated: 04/06/2016 Goal Status: Active Interventions: Assess patient nutrition upon admission and as needed per policy Notes: Orientation to the Wound Care Program Nursing Diagnoses: Knowledge deficit related to the wound healing center program SIERRIA, ONOFREY ([id number]) Goals: Patient/caregiver will verbalize understanding of the [place] Date Initiated: 04/06/2016 Goal Status: Active Interventions: Provide education on orientation to the wound center Notes: Pain, Acute or Chronic Nursing Diagnoses: Pain, acute or chronic: actual or potential Potential alteration in comfort, pain Goals: Patient will verbalize adequate pain control and receive pain control interventions during procedures as needed Date Initiated: 04/06/2016 Goal Status: Active Interventions: Assess comfort goal upon admission Complete pain assessment as per visit requirements Notes: Wound/Skin Impairment Nursing Diagnoses: Impaired tissue integrity Goals: Ulcer/skin breakdown will have a volume reduction of 30% by week 4 Date Initiated: 04/06/2016 Goal Status: Active Ulcer/skin breakdown will have a volume reduction of 50% by week 8 Date Initiated: 04/06/2016 Goal Status: Active Ulcer/skin breakdown will have a volume reduction of 80% by week 12 Date Initiated: 04/06/2016 Goal Status: Active Interventions: Assess patient/caregiver ability to obtain necessary supplies Aguillard, Randye (  [id number]) Assess  ulceration(s) every visit Notes: Electronic Signature(s) Signed: 10/18/2016 4:34:43 PM By: Alric Quan Entered By: Alric Quan on 10/18/2016 15:39:13 [name] ([id number]) -------------------------------------------------------------------------------- Pain Assessment Details Patient Name: [name] Date of Service: 10/18/2016 12:30 PM Medical Record Patient Account Number: [id number] Number: Treating RN: Ahmed Prima Jul 01, 1919 (83 y.o. Other Clinician: Date of Birth/Sex: Female) Treating ROBSON, [place] Primary Care Physician: Lorelee Market Physician/Extender: G Referring Physician: Lorelee Market Weeks in Treatment: 27 Active Problems Location of Pain Severity and Description of Pain Patient Has Paino No Site Locations With Dressing Change: No Pain Management and Medication Current Pain Management: Electronic Signature(s) Signed: 10/18/2016 4:34:43 PM By: Alric Quan Entered By: Alric Quan on 10/18/2016 12:43:23 [name] ([id number]) -------------------------------------------------------------------------------- Patient/Caregiver Education Details Patient Name: [name] Date of Service: 10/18/2016 12:30 PM Medical Record Patient Account Number: [id number] Number: Treating RN: Carolyne Fiscal, Debi Feb 12, 1919 (83 y.o. Other Clinician: Date of Birth/Gender: Female) Treating ROBSON, [place] Primary Care Physician: Lorelee Market Physician/Extender: G Referring Physician: Vinson Moselle in Treatment: 27 Education Assessment Education Provided To: Patient Education Topics Provided Wound/Skin Impairment: Handouts: Other: change dressings as directed Methods: Demonstration, Explain/Verbal Responses: State content correctly Electronic Signature(s) Signed: 10/18/2016 4:34:43 PM By: Alric Quan Entered By: Alric Quan on 10/18/2016 15:43:00 [name]  ([id number]) -------------------------------------------------------------------------------- Wound Assessment Details Patient Name: [name] Date of Service: 10/18/2016 12:30 PM Medical Record Patient Account Number: [id number] Number: Treating RN: Ahmed Prima 1919-01-24 (83 y.o. Other Clinician: Date of Birth/Sex: Female) Treating ROBSON, [place] Primary Care Physician: Lorelee Market Physician/Extender: G Referring Physician: Lorelee Market Weeks in Treatment: 27 Wound Status Wound Number: 2 Primary Arterial Insufficiency Ulcer Etiology: Wound Location: Left Toe Fifth - Lateral Wound Status: Open Wounding Event: Gradually Appeared Comorbid Cataracts, Hypertension, Date Acquired: 04/07/2015 History: Osteoarthritis Weeks Of Treatment: 27 Clustered Wound: No Photos Photo Uploaded By: Alric Quan on 10/18/2016 15:58:32 Wound Measurements Length: (cm) 0.1 % Reduction in Width: (cm) 0.1 % Reduction in Depth: (cm) 0.1 Epithelializati Area: (cm) 0.008 Tunneling: Volume: (cm) 0.001 Undermining: Area: 83% Volume: 88.9% on: None No No Wound Description Classification: Partial Thickness Foul Odor Afte Wound Margin: Thickened Exudate Amount: Small Exudate Type: Serous Exudate Color: amber r Cleansing: No Wound Bed Granulation Amount: None Present (0%) Exposed Structure Necrotic Amount: Large (67-100%) Fascia Exposed: No Necrotic Quality: Adherent Slough Fat Layer Exposed: No Hackel, Marilla ([id number]) Tendon Exposed: No Muscle Exposed: No Joint Exposed: No Bone Exposed: No Limited to Skin Breakdown Periwound Skin Texture Texture Color No Abnormalities Noted: No No Abnormalities Noted: No Callus: No Atrophie Blanche: No Crepitus: No Cyanosis: No Excoriation: No Ecchymosis: No Fluctuance: No Erythema: No Friable: No Hemosiderin Staining: No Induration: No Mottled: Yes Localized Edema: Yes Pallor: No Rash: No Rubor:  No Scarring: No Temperature / Pain Moisture Temperature: No Abnormality No Abnormalities Noted: No Tenderness on Palpation: Yes Dry / Scaly: No Maceration: Yes Moist: Yes Wound Preparation Ulcer Cleansing: Rinsed/Irrigated with Saline Topical Anesthetic Applied: Other: lidocaine 4%, Treatment Notes Wound #2 (Left, Lateral Toe Fifth) 1. Cleansed with: Clean wound with Normal Saline 2. Anesthetic Topical Lidocaine 4% cream to wound bed prior to debridement 3. Peri-wound Care: Skin Prep 4. Dressing Applied: Prisma Ag 5. Secondary Dressing Applied Bordered Foam Dressing Dry Gauze Electronic Signature(s) Signed: 10/18/2016 4:34:43 PM By: Alric Quan Entered By: Alric Quan on 10/18/2016 13:11:24 [name] ([id number]) [place], Estill Bamberg ([id number]) -------------------------------------------------------------------------------- Wound Assessment Details Patient Name: [name] Date of Service: 10/18/2016 12:30 PM Medical Record Patient Account Number: [id number]  Number: Treating RN: Carolyne Fiscal, Debi [birth year] (83 y.o. Other Clinician: Date of Birth/Sex: Female) Treating ROBSON, [place] Primary Care Physician: Lorelee Market Physician/Extender: G Referring Physician: Lorelee Market Weeks in Treatment: 27 Wound Status Wound Number: 4 Primary Arterial Insufficiency Ulcer Etiology: Wound Location: Right Foot - Lateral Wound Status: Open Wounding Event: Gradually Appeared Comorbid Cataracts, Hypertension, Date Acquired: 07/26/2016 History: Osteoarthritis Weeks Of Treatment: 11 Clustered Wound: No Photos Photo Uploaded By: Alric Quan on 10/18/2016 15:58:32 Wound Measurements Length: (cm) 0.4 Width: (cm) 0.6 Depth: (cm) 0.2 Area: (cm) 0.188 Volume: (cm) 0.038 % Reduction in Area: -300% % Reduction in Volume: -660% Epithelialization: None Tunneling: No Undermining: No Wound Description Classification: Partial  Thickness Wound Margin: Distinct, outline attached Exudate Amount: Large Exudate Type: Serous Exudate Color: amber Foul Odor After Cleansing: No Wound Bed Granulation Amount: None Present (0%) Exposed Structure Necrotic Amount: Large (67-100%) Fascia Exposed: No Necrotic Quality: Adherent Slough Fat Layer Exposed: No Vickrey, Sharyon ([id number]) Tendon Exposed: No Muscle Exposed: No Joint Exposed: No Bone Exposed: No Limited to Skin Breakdown Periwound Skin Texture Texture Color No Abnormalities Noted: No No Abnormalities Noted: No Moisture Temperature / Pain No Abnormalities Noted: No Temperature: No Abnormality Moist: Yes Tenderness on Palpation: Yes Wound Preparation Ulcer Cleansing: Rinsed/Irrigated with Saline Topical Anesthetic Applied: Other: lidocaine 4%, Treatment Notes Wound #4 (Right, Lateral Foot) 1. Cleansed with: Clean wound with Normal Saline 2. Anesthetic Topical Lidocaine 4% cream to wound bed prior to debridement 3. Peri-wound Care: Skin Prep 4. Dressing Applied: Prisma Ag 5. Secondary Dressing Applied Bordered Foam Dressing Dry Gauze Electronic Signature(s) Signed: 10/18/2016 4:34:43 PM By: Alric Quan Entered By: Alric Quan on 10/18/2016 12:52:53 [name] ([id number]) -------------------------------------------------------------------------------- Vitals Details Patient Name: [name] Date of Service: 10/18/2016 12:30 PM Medical Record Patient Account Number: [id number] Number: Treating RN: Ahmed Prima 10/03/1919 (83 y.o. Other Clinician: Date of Birth/Sex: Female) Treating ROBSON, [place] Primary Care Physician: Lorelee Market Physician/Extender: G Referring Physician: Lorelee Market Weeks in Treatment: 27 Vital Signs Time Taken: 12:42 Temperature (F): 98.0 Height (in): 64 Pulse (bpm): 74 Weight (lbs): 158 Respiratory Rate (breaths/min): 16 Body Mass Index (BMI): 27.1 Blood Pressure  (mmHg): 156/52 Reference Range: 80 - 120 mg / dl Electronic Signature(s) Signed: 10/18/2016 4:34:43 PM By: Alric Quan Entered By: Alric Quan on 10/18/2016 12:43:49

## 2016-10-19 NOTE — Progress Notes (Addendum)
MIMMA, MEER ([id number]) Visit Report for 10/18/2016 Chief Complaint Document Details Patient Name: [name], [name] Date of Service: 10/18/2016 12:30 PM Medical Record Patient Account Number: [id number] Number: Treating RN: Ahmed Prima 10/10/1919 (83 y.o. Other Clinician: Date of Birth/Sex: Female) Treating Myka Hitz Primary Care Physician/Extender: Derrill Memo, [place] Physician: Referring Physician: Vinson Moselle in Treatment: 27 Information Obtained from: Patient Chief Complaint here for follow-up on right lateral foot ulcer and left foot ulcers Electronic Signature(s) Signed: 10/18/2016 4:24:12 PM By: [name] MD Entered By: [name] on 10/18/2016 13:20:56 [name] ([id number]) -------------------------------------------------------------------------------- Debridement Details Patient Name: [name] Date of Service: 10/18/2016 12:30 PM Medical Record Patient Account Number: [id number] Number: Treating RN: Carolyne Fiscal, Debi 21-Jul-1919 (83 y.o. Other Clinician: Date of Birth/Sex: Female) Treating [place] Duquette Primary Care Physician/Extender: Derrill Memo, [place] Physician: Referring Physician: Vinson Moselle in Treatment: 27 Debridement Performed for Wound #4 Right,Lateral Foot Assessment: Performed By: Physician Ricard Dillon, MD Debridement: Debridement Pre-procedure Yes - 13:11 Verification/Time Out Taken: Start Time: 13:12 Pain Control: Lidocaine 4% Topical Solution Level: Skin/Subcutaneous Tissue Total Area Debrided (L x 0.4 (cm) x 0.6 (cm) = 0.24 (cm) W): Tissue and other Viable, Non-Viable, Exudate, Fibrin/Slough, Subcutaneous material debrided: Instrument: Curette Bleeding: Minimum Hemostasis Achieved: Pressure End Time: 13:14 Procedural Pain: 0 Post Procedural Pain: 0 Response to Treatment: Procedure was tolerated well Post Debridement Measurements of Total  Wound Length: (cm) 0.4 Width: (cm) 0.6 Depth: (cm) 0.2 Volume: (cm) 0.038 Character of Wound/Ulcer Post Requires Further Debridement Debridement: Severity of Tissue Post Debridement: Fat layer exposed Post Procedure Diagnosis Same as Pre-procedure Electronic Signature(s) Signed: 10/18/2016 4:24:12 PM By: [name] MD [place], Estill Bamberg ([id number]) Signed: 10/18/2016 4:34:43 PM By: Alric Quan Entered By: [name] on 10/18/2016 13:20:37 [name] ([id number]) -------------------------------------------------------------------------------- HPI Details Patient Name: [name] Date of Service: 10/18/2016 12:30 PM Medical Record Patient Account Number: [id number] Number: Treating RN: Carolyne Fiscal, Debi [birth year] (83 y.o. Other Clinician: Date of Birth/Sex: Female) Treating Janis Sol Primary Care Physician/Extender: Derrill Memo, [place] Physician: Referring Physician: Lorelee Market Weeks in Treatment: 27 History of Present Illness HPI Description: 04/06/16; this is a 83 year old woman who arrives accompanied by 2 daughters for a wound on her left ankle and her left fifth toe. These have apparently been present for a year. I'm not quite certain how she came to this clinic however she was being followed by Sharlotte Alamo her podiatrist for these wounds. She was also referred to [facility] and they apparently did a test presumably arterial studies although we don't have any of these results and we couldn't get through to the office today. The family but has been applying a combination of Bactroban and a light bandage and perhaps more recently Silvadene cream. She did have an x-ray of the foot roughly 6 months ago at the podiatry office the family was unaware that if there were any abnormalities. Apparently they have not seen any healing here. Our intake nurse noted a slight skin tear on the right anterior lower leg. Nobody seemed  aware of this. ABIs calculated in this clinic was 0.3 on the right and 0.4 on the left I have reviewed things [facility]. There is very little information on this patient. She apparently follows in current total clinic which we don't have information from. She has mentioned already been to a [facility]. She has a history of hypothyroidism, nephrolithiasis arthritis and has had a previous mastectomy. 04/13/16; patient's x-ray was normal. She is already  been to see Dr. Delana Meyer vascular surgery. Her arterial exam was from November 2016 this showed a left ABI of 0.62 her right of 0.76. Her duplex ultrasound of the left leg showed biphasic waves in the common femoral and distal femoral artery however monophasic waves in the superficial femoral artery proximal and mid biphasic it distal. 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 The patient tells me that she has pain at night when she tries to lie down which is improved by getting up and sitting in the chair this sounds like claudication at rest. Her wounds are on the left medial malleolus and the dorsal fifth toe small punched out wounds that are right on bone. We use Santyl last week 04/20/16: nurse informed me pt has declined evaluation for significant PAD. she denies systemic s/s of infections. 04/27/16;; the patient has had noninvasive arterial studies done in November 2016. ABI and the left was 0.62. Monophasic waves at the superficial femoral artery. Occluded to the posterior tibial artery. So had greater than 50% stenosis of the right superficial femoral and greater than 50% stenosis of the left superficial femoral artery. She had bilateral tibial peroneal artery disease. Both of the wounds on the left fifth toe and left lateral malleolus have been present for more than a year. They have been to see vein and vascular. The patient has some pain but miraculously I think the wounds have largely been stable. No evidence of infection 05/04/16; she  goes for a noninvasive study tomorrow and then sees Dr. Fletcher Anon on Monday. By the time she is here next week we should have a better picture of whether something can be done with regards to her arterial flow. We continue to have ischemic-looking wounds on the left fifth toe and left lateral malleolus. 05/10/16; the patient went for her arterial studies and saw Dr. Fletcher Anon. As predicted she is felt to have critical Loor, [name] ([id number]) limb ischemia. The feeling is that she has occlusion of the SFA. The feeling would she would be a candidate for a stent to the SFA. The patient did not make a decision to proceed with the procedure and she is here with family members to discuss this me today. He shouldn't has a lot of pain and cannot sleep and rest well at night per her family. 05/24/16; the patient went and had a complex revascularization/angioplasty of the left superficial femoral artery followed by drug-coated balloon angioplasty and spots stenting. She tolerated the procedure well. She was recommended for dual antiplatelet drugs with Plavix and aspirin for at least a month. She went yesterday for I believe follow-up serial Dopplers and ABIs although I don't see these results. The patient is unfortunately complaining of a lot of pain in her bilateral lower legs below the knees from the ankle to the knees. She apparently was prescribed lidocaine and apparently put this on her legs instead of over the wound areas. This may have something to do with it however there is a lot of edema in her bilateral legs I was able to find her arterial studies from yesterday. The left ABI has improved up to 0.66 post left SFA stent. The bilateral great toe indices remain abnormal with the left being in the 0.25 range. Duplex ultrasound showed her left SFA stent is patent monophasic waveforms persist in the left leg 06/07/16; continued punched-out areas over the dorsal left fifth toe and left lateral malleolus. No  major improvement 06/28/16; the areas over her dorsal left fifth toe and left  lateral malleolus are covered in surface slough we are using Iodoflex 07/05/16. We have been using Iodoflex for 2-3 weeks now. I have not been debridement is because of pain 07/19/16 currently we have been using Iodoflex for roughly the past month. Previously we were unable to debride due to pain although today patient states that the pain is not nearly as severe as it has been in the past. In fact she rates this to be a 1 out of 10 and it worse to a to 10 with palpation of the wound. All and all her and her daughter feel like this is actually doing steadily better at this point in time. She is pleased with progress and we have been seeing her every 2 weeks. 08/02/16; this is a delightful 83 year old woman I have not seen in over a month. She has arterial insufficiency wounds remaining over the left lateral malleolus and the left fifth toe. With the help of Dr. Fletcher Anon we are able to get her revascularized on the left. The 2 wounds on the left have been making good progress and are definitely smaller especially the area over the left lateral malleolus. She is certainly in a lot less pain than she used to be although I still think there is some claudication type pain. Unfortunately she is developed a area on the right lateral foot which is a small open area but I think is threatened. I suspect a small ischemic areas well. Also on her right fifth toe there is an area that is not open but looks as though it is receiving too much pressure from footwear. Finally an area over her right malleolus although I don't think this is on its way to anything ominous 08/17/16 patient presents today for follow up evaluation she tells me that she is really doing fairly well from a pain standpoint compared to where she has been previous. She is tolerating the dressing changes without any complication. She continues to have discharge and  drainage however. 08-30-16 Ms. Brosch presents today with her daughter, she states that she continues to have intermittent shooting pains to the left lateral fifth toe at this site of seems to be a healed ulcer. She denies any other issues that her wound related since her last appointment. Her daughter states that she is in need of a tramadol refill at this time as she uses half a tablet at at bedtime to aid in sleep due to foot pain. Iodoflex has been used on all wounds in previous dressing changes. 09/13/16; the patient has ischemic wounds in her feet. The area over the left lateral malleolus is healed and the area over the left fifth toe looks improved.. She has an area on the lateral aspect of her right foot which is a small but deep wound. Finally she has a new open area on the medial aspect of the left medial malleolus. This is superficial 09/27/16; the area over the left lateral malleolus remains healed. The area over the left fifth toe has a surface and she states the pain is better but I don't think this is completely closed. She has an area on the [place], [place] ([id number]) lateral aspect of her right foot is a small but deep wound. The new open area on the medial aspect of the left ankle was closed from last time. 10/18/16; the area over her left lateral malleolus remains healed. The area over the left fifth toe has a surface over the top of this however there is no  overt open area. Given the underlying issues of severe PAD and continued pain in the toe I would think it would be unlikely this is truly healed however I am not planning to debridement this area. The area on the right lateral foot which was a more recent wound is a small punched out painful area again has significant surface slough and nonviable tissue. It is clear the patient still has claudication type pain however she remains functional. I have been giving her tramadol when necessary and that seems to help a lot with  her pain the patient follows up with Dr. Fletcher Anon on January 18/18 Electronic Signature(s) Signed: 10/18/2016 4:24:12 PM By: [name] MD Entered By: [name] on 10/18/2016 13:26:40 [name] ([id number]) -------------------------------------------------------------------------------- Physical Exam Details Patient Name: [name] Date of Service: 10/18/2016 12:30 PM Medical Record Patient Account Number: [id number] Number: Treating RN: Ahmed Prima [birth year] (83 y.o. Other Clinician: Date of Birth/Sex: Female) Treating Avigdor Dollar Primary Care Physician/Extender: Derrill Memo, [place] Physician: Referring Physician: Lorelee Market Weeks in Treatment: 27 Constitutional Sitting or standing Blood Pressure is within target range for patient.. Pulse regular and within target range for patient.Marland Kitchen Respirations regular, non-labored and within target range.. Temperature is normal and within the target range for the patient.. Patient's appearance is neat and clean. Appears in no acute distress. Well nourished and well developed.Marland Kitchen Respiratory Above normal respiratory effort noted. Respiratory rate elveaated. Cardiovascular Femoral arteries without bruits and pulses strong.. Pedal pulses absent bilaterally.. Lymphatic None palpable in the popliteal or inguinal area. Notes Wound exam; on the left foot to her left lateral ankle which was the original wound remains closed. The left medial ankle is also closed. The area on her lateral aspect of her left fifth toe has a new surface on this. This looks more mature than when she was here the last time and I am not planning on doing any debridement in this area. The area on the right lateral foot is still a deep punched out. This looks arterial. Requires debridement with a #3 curet removing slough and nonviable subcutaneous tissue there is no evidence of infection in any area. Electronic Signature(s) Signed:  10/18/2016 4:24:12 PM By: [name] MD Entered By: [name] on 10/18/2016 13:26:07 [name] ([id number]) -------------------------------------------------------------------------------- Physician Orders Details Patient Name: [name] Date of Service: 10/18/2016 12:30 PM Medical Record Patient Account Number: [id number] Number: Treating RN: Ahmed Prima [birth year] (83 y.o. Other Clinician: Date of Birth/Sex: Female) Treating Davielle Lingelbach Primary Care Physician/Extender: Derrill Memo, [place] Physician: Referring Physician: Vinson Moselle in Treatment: 31 Verbal / Phone Orders: Yes Clinician: Carolyne Fiscal, Debi Read Back and Verified: Yes Diagnosis Coding Wound Cleansing Wound #2 Left,Lateral Toe Fifth o Clean wound with Normal Saline. - for clinic use o Cleanse wound with mild soap and water o May Shower, gently pat wound dry prior to applying new dressing. Wound #4 Right,Lateral Foot o Clean wound with Normal Saline. - for clinic use o Cleanse wound with mild soap and water o May Shower, gently pat wound dry prior to applying new dressing. Anesthetic Wound #2 Left,Lateral Toe Fifth o Topical Lidocaine 4% cream applied to wound bed prior to debridement - for clinic use Wound #4 Right,Lateral Foot o Topical Lidocaine 4% cream applied to wound bed prior to debridement - for clinic use Skin Barriers/Peri-Wound Care Wound #2 Left,Lateral Toe Fifth o Skin Prep Wound #4 Right,Lateral Foot o Skin Prep Primary Wound Dressing Wound #2 Left,Lateral Toe Fifth o Prisma Ag Wound #  [place] Secondary Dressing L[place], [name] ([id number]) Wound #2 Left,Lateral Toe Fifth o Dry Gauze o Boardered Foam Dressing Wound #4 Right,Lateral Foot o Dry Gauze o Boardered Foam Dressing Dressing Change Frequency Wound #2 Left,Lateral Toe Fifth o Change dressing every other day. Wound #4  Right,Lateral Foot o Change dressing every other day. Follow-up Appointments Wound #2 Left,Lateral Toe Fifth o Return Appointment in 2 weeks. Wound #4 Right,Lateral Foot o Return Appointment in 2 weeks. Edema Control Wound #2 Left,Lateral Toe Fifth o Elevate legs to the level of the heart and pump ankles as often as possible o Other: - compression stockings Wound #4 Right,Lateral Foot o Elevate legs to the level of the heart and pump ankles as often as possible o Other: - compression stockings Additional Orders / Instructions Wound #2 Left,Lateral Toe Fifth o Increase protein intake. Wound #4 Right,Lateral Foot o Increase protein intake. Medications-please add to medication list. Wound #2 Left,Lateral Toe Fifth o Other: - Vitamin C, Zinc, Multivitamins Tramadol Wound #4 Right,Lateral Foot o Other: - Vitamin C, Zinc, Multivitamins Tramadol [name], [name] ([id number]) Electronic Signature(s) Signed: 10/18/2016 4:24:12 PM By: [name] MD Signed: 10/18/2016 4:34:43 PM By: Alric Quan Entered By: Alric Quan on 10/18/2016 13:31:10 [name] ([id number]) -------------------------------------------------------------------------------- Problem List Details Patient Name: [name] Date of Service: 10/18/2016 12:30 PM Medical Record Patient Account Number: [id number] Number: Treating RN: Ahmed Prima 03-13-1919 (83 y.o. 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 Other Clinician: Date of Birth/Sex: Female) Treating Kamisha Ell Primary Care Physician/Extender: Derrill Memo, [place] Physician: Referring Physician: Vinson Moselle in Treatment: 27 Subjective Chief Complaint Information obtained from Patient here for follow-up on right lateral foot ulcer and left foot ulcers History of Present Illness (HPI) 04/06/16; this is a 83 year old woman who arrives accompanied by 2 daughters for a wound on her left ankle and her left fifth toe. These have apparently been present for a year. I'm not quite certain how she came to this clinic however she was being followed by Sharlotte Alamo her podiatrist for these wounds. She was also referred to [facility] and they apparently did a test presumably arterial studies although we don't have any of these results and we couldn't get through to the office today. The family but has been applying a combination of Bactroban and a light bandage and perhaps more recently Silvadene cream. She did have an x-ray of the foot roughly 6 months ago at the podiatry office the family was unaware that if there were any abnormalities. Apparently they have not seen any healing here. Our intake nurse noted a slight skin tear on the right anterior lower  leg. Nobody seemed  aware of this. ABIs calculated in this clinic was 0.3 on the right and 0.4 on the left I have reviewed things [facility]. There is very little information on this patient. She apparently follows in current total clinic which we don't have information from. She has mentioned already been to a [facility]. She has a history of hypothyroidism, nephrolithiasis arthritis and has had a previous mastectomy. 04/13/16; patient's x-ray was normal. She is already been to see Dr. Delana Meyer vascular surgery. Her arterial exam was from November 2016 this showed a left ABI of 0.62 her right of 0.76. Her duplex ultrasound of the left leg showed biphasic waves in the common femoral and distal femoral artery however monophasic waves in the superficial femoral artery proximal and mid biphasic it distal. 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 The patient tells me that she has pain at night when she tries to lie down which is improved by getting up and sitting in the chair this sounds like claudication at rest. Her wounds are on the left medial malleolus and the dorsal fifth toe small punched out wounds that are right on bone. We use Santyl last week 04/20/16: nurse informed me pt has declined evaluation for significant PAD. she denies systemic s/s of infections. 04/27/16;; the patient has had noninvasive arterial studies done in November 2016. ABI and the left was 0.62. Monophasic waves at the superficial femoral artery. Occluded to the posterior tibial artery. So had greater than 50% stenosis of the right superficial femoral and greater than 50% stenosis of the left superficial femoral artery. She had bilateral tibial peroneal artery disease. Both of the wounds on the left fifth toe and left lateral malleolus have been present for more than a year. They have been to see vein and [name], [name] ([id number]) vascular. The patient has some pain but miraculously I think the wounds have largely been stable. No evidence  of infection 05/04/16; she goes for a noninvasive study tomorrow and then sees Dr. Fletcher Anon on Monday. By the time she is here next week we should have a better picture of whether something can be done with regards to her arterial flow. We continue to have ischemic-looking wounds on the left fifth toe and left lateral malleolus. 05/10/16; the patient went for her arterial studies and saw Dr. Fletcher Anon. As predicted she is felt to have critical limb ischemia. The feeling is that she has occlusion of the SFA. The feeling would she would be a candidate for a stent to the SFA. The patient did not make a decision to proceed with the procedure and she is here with family members to discuss this me today. He shouldn't has a lot of pain and cannot sleep and rest well at night per her family. 05/24/16; the patient went and had a complex revascularization/angioplasty of the left superficial femoral artery followed by drug-coated balloon angioplasty and spots stenting. She tolerated the procedure well. She was recommended for dual antiplatelet drugs with Plavix and aspirin for at least a month. She went yesterday for I believe follow-up serial Dopplers and ABIs although I don't see these results. The patient is unfortunately complaining of a lot of pain in her bilateral lower legs below the knees from the ankle to the knees. She apparently was prescribed lidocaine and apparently put this on her legs instead of over the wound areas. This may have something to do with it however there is a lot of edema in her bilateral legs I was  able to find her arterial studies from yesterday. The left ABI has improved up to 0.66 post left SFA stent. The bilateral great toe indices remain abnormal with the left being in the 0.25 range. Duplex ultrasound showed her left SFA stent is patent monophasic waveforms persist in the left leg 06/07/16; continued punched-out areas over the dorsal left fifth toe and left lateral malleolus. No  major improvement 06/28/16; the areas over her dorsal left fifth toe and left lateral malleolus are covered in surface slough we are using Iodoflex 07/05/16. We have been using Iodoflex for 2-3 weeks now. I have not been debridement is because of pain 07/19/16 currently we have been using Iodoflex for roughly the past month. Previously we were unable to debride due to pain although today patient states that the pain is not nearly as severe as it has been in the past. In fact she rates this to be a 1 out of 10 and it worse to a to 10 with palpation of the wound. All and all her and her daughter feel like this is actually doing steadily better at this point in time. She is pleased with progress and we have been seeing her every 2 weeks. 08/02/16; this is a delightful 83 year old woman I have not seen in over a month. She has arterial insufficiency wounds remaining over the left lateral malleolus and the left fifth toe. With the help of Dr. Fletcher Anon we are able to get her revascularized on the left. The 2 wounds on the left have been making good progress and are definitely smaller especially the area over the left lateral malleolus. She is certainly in a lot less pain than she used to be although I still think there is some claudication type pain. Unfortunately she is developed a area on the right lateral foot which is a small open area but I think is threatened. I suspect a small ischemic areas well. Also on her right fifth toe there is an area that is not open but looks as though it is receiving too much pressure from footwear. Finally an area over her right malleolus although I don't think this is on its way to anything ominous 08/17/16 patient presents today for follow up evaluation she tells me that she is really doing fairly well from a pain standpoint compared to where she has been previous. She is tolerating the dressing changes without any complication. She continues to have discharge and  drainage however. 08-30-16 Ms. Vidas presents today with her daughter, she states that she continues to have intermittent shooting pains to the left lateral fifth toe at this site of seems to be a healed ulcer. She denies any other issues that her wound related since her last appointment. Her daughter states that she is in need of a tramadol refill at this time as she uses half a tablet at at bedtime to aid in sleep due to foot pain. Iodoflex has been used on all wounds in previous dressing changes. [name], [name] ([id number]) 09/13/16; the patient has ischemic wounds in her feet. The area over the left lateral malleolus is healed and the area over the left fifth toe looks improved.. She has an area on the lateral aspect of her right foot which is a small but deep wound. Finally she has a new open area on the medial aspect of the left medial malleolus. This is superficial 09/27/16; the area over the left lateral malleolus remains healed. The area over the left fifth toe has a  surface and she states the pain is better but I don't think this is completely closed. She has an area on the lateral aspect of her right foot is a small but deep wound. The new open area on the medial aspect of the left ankle was closed from last time. 10/18/16; the area over her left lateral malleolus remains healed. The area over the left fifth toe has a surface over the top of this however there is no overt open area. Given the underlying issues of severe PAD and continued pain in the toe I would think it would be unlikely this is truly healed however I am not planning to debridement this area. The area on the right lateral foot which was a more recent wound is a small punched out painful area again has significant surface slough and nonviable tissue. It is clear the patient still has claudication type pain however she remains functional. I have been giving her tramadol when necessary and that seems to help a lot with  her pain the patient follows up with Dr. Fletcher Anon on January 18/18 Objective Constitutional Sitting or standing Blood Pressure is within target range for patient.. Pulse regular and within target range for patient.Marland Kitchen Respirations regular, non-labored and within target range.. Temperature is normal and within the target range for the patient.. Patient's appearance is neat and clean. Appears in no acute distress. Well nourished and well developed.. Vitals Time Taken: 12:42 PM, Height: 64 in, Weight: 158 lbs, BMI: 27.1, Temperature: 98.0 F, Pulse: 74 bpm, Respiratory Rate: 16 breaths/min, Blood Pressure: 156/52 mmHg. Respiratory Above normal respiratory effort noted. Respiratory rate elveaated. Cardiovascular Femoral arteries without bruits and pulses strong.. Pedal pulses absent bilaterally.. Lymphatic None palpable in the popliteal or inguinal area. General Notes: Wound exam; on the left foot to her left lateral ankle which was the original wound remains closed. The left medial ankle is also closed. The area on her lateral aspect of her left fifth toe has a new surface on this. This looks more mature than when she was here the last time and I am not planning on doing any debridement in this area. The area on the right lateral foot is still a deep punched out. This looks arterial. Requires debridement with a #3 curet removing slough and nonviable subcutaneous tissue there is no evidence of infection in any area. [name], [name] ([id number]) Integumentary (Hair, Skin) Wound #2 status is Open. Original cause of wound was Gradually Appeared. The wound is located on the Left,Lateral Toe Fifth. The wound measures 0.1cm length x 0.1cm width x 0.1cm depth; 0.008cm^2 area and 0.001cm^3 volume. The wound is limited to skin breakdown. There is no tunneling or undermining noted. There is a small amount of serous drainage noted. The wound margin is thickened. There is no granulation within the wound bed.  There is a large (67-100%) amount of necrotic tissue within the wound bed including Adherent Slough. The periwound skin appearance exhibited: Localized Edema, Maceration, Moist, Mottled. The periwound skin appearance did not exhibit: Callus, Crepitus, Excoriation, Fluctuance, Friable, Induration, Rash, Scarring, Dry/Scaly, Atrophie Blanche, Cyanosis, Ecchymosis, Hemosiderin Staining, Pallor, Rubor, Erythema. Periwound temperature was noted as No Abnormality. The periwound has tenderness on palpation. Wound #4 status is Open. Original cause of wound was Gradually Appeared. The wound is located on the Right,Lateral Foot. The wound measures 0.4cm length x 0.6cm width x 0.2cm depth; 0.188cm^2 area and 0.038cm^3 volume. The wound is limited to skin breakdown. There is no tunneling or undermining noted. There is  a large amount of serous drainage noted. The wound margin is distinct with the outline attached to the wound base. There is no granulation within the wound bed. There is a large (67-100%) amount of necrotic tissue within the wound bed including Adherent Slough. The periwound skin appearance exhibited: Moist. Periwound temperature was noted as No Abnormality. The periwound has tenderness on palpation. 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 There was a Skin/Subcutaneous Tissue Debridement [id number]) debridement with total area of 0.24 sq cm performed by Ricard Dillon, MD. with the following instrument(s): Curette to remove Viable and Non-Viable  tissue/material including Exudate, Fibrin/Slough, and Subcutaneous after achieving pain control using Lidocaine 4% Topical Solution. A time out was conducted at 13:11, prior to the start of the procedure. A Minimum amount of bleeding was controlled with Pressure. The procedure was tolerated well with a pain level of 0 throughout and a pain level of 0 following the procedure. Post Debridement Measurements: 0.4cm length x 0.6cm width x 0.2cm depth; 0.038cm^3 volume. [name], [name] ([id number]) Character of Wound/Ulcer Post Debridement requires further debridement. Severity of Tissue Post Debridement is: Fat layer exposed. Post procedure Diagnosis Wound #4: Same as Pre-Procedure Plan Wound Cleansing: Wound #2 Left,Lateral Toe Fifth: Clean wound with Normal Saline. - for clinic use Cleanse wound with mild soap and water May Shower, gently pat wound dry prior to applying new dressing. Wound #4 Right,Lateral Foot: Clean wound with Normal Saline. - for clinic use Cleanse wound with mild soap and water May Shower, gently pat wound dry prior to applying new dressing. Anesthetic: Wound #2 Left,Lateral Toe Fifth: Topical Lidocaine 4% cream applied to wound bed prior to debridement - for clinic use Wound #4 Right,Lateral Foot: Topical Lidocaine 4% cream applied to wound bed prior to debridement - for clinic use Skin Barriers/Peri-Wound Care: Wound #2 Left,Lateral Toe Fifth: Skin Prep Wound #4 Right,Lateral Foot: Skin Prep Primary Wound Dressing: Wound #2 Left,Lateral Toe Fifth: Prisma Ag Wound #4 Right,Lateral Foot: Prisma Ag Secondary Dressing: Wound #2 Left,Lateral Toe Fifth: Dry Gauze Boardered Foam Dressing Wound #4 Right,Lateral Foot: Dry Gauze Boardered Foam Dressing Dressing Change Frequency: Wound #2 Left,Lateral Toe Fifth: Change dressing every other day. Wound #4 Right,Lateral Foot: Change dressing every other day. Follow-up Appointments: Wound #2 Left,Lateral Toe  Fifth: Return Appointment in 2 weeks. [name], [name] ([id number]) Wound #4 Right,Lateral Foot: Return Appointment in 2 weeks. Edema Control: Wound #2 Left,Lateral Toe Fifth: Elevate legs to the level of the heart and pump ankles as often as possible Other: - compression stockings Wound #4 Right,Lateral Foot: Elevate legs to the level of the heart and pump ankles as often as possible Other: - compression stockings Additional Orders / Instructions: Wound #2 Left,Lateral Toe Fifth: Increase protein intake. Wound #4 Right,Lateral Foot: Increase protein intake. Medications-please add to medication list.: Wound #2 Left,Lateral Toe Fifth: Other: - Vitamin C, Zinc, Multivitamins Tramadol Wound #4 Right,Lateral Foot: Other: - Vitamin C, Zinc, Multivitamins Tramadol #1 I'm going to continue with Prisma to the right lateral foot wound which is at this point her only remaining open area. #2  the area on the surface of the left fifth toe wound may be a healed state may be not. I discussed this with the patient and her daughter I am not going to go ahead with debridement of this area, this may be the best surface we can do given her arterial flow. #3 the patient has an appointment with Dr. Fletcher Anon on January 18, will see if he is interested in doing any further attempts at revascularization on the right. The patient still has claudication pain however she seems to tolerate this very well and I don't know that this is having implications for her quality of life at the moment. Electronic Signature(s) Signed: 10/19/2016 4:49:12 PM By: Gretta Cool RN, BSN, Kim RN, BSN Signed: 10/19/2016 6:05:53 PM By: [name] MD Previous Signature: 10/18/2016 4:24:12 PM Version By: [name] MD Entered By: Gretta Cool RN, BSN, Kim on 10/19/2016 16:49:12 [name], [name] ([id number]) -------------------------------------------------------------------------------- [place] Details Patient Name: [name] Date  of Service: 10/18/2016 Medical Record Patient Account Number: [id number] Number: Treating RN: Ahmed Prima 06/17/1919 (83 y.o. 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

## 2016-10-25 ENCOUNTER — Ambulatory Visit: Payer: Medicare PPO | Admitting: Internal Medicine

## 2016-10-27 ENCOUNTER — Ambulatory Visit: Payer: Medicare PPO | Admitting: Cardiovascular Disease

## 2016-11-01 ENCOUNTER — Encounter: Payer: Medicare PPO | Admitting: Internal Medicine

## 2016-11-01 DIAGNOSIS — I70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: Secondary | ICD-10-CM | POA: Diagnosis not present

## 2016-11-02 NOTE — Progress Notes (Signed)
EVALY, GINGER ([id number]) Visit Report for 11/01/2016 Arrival Information Details Patient Name: [name], [name] Date of Service: 11/01/2016 12:30 PM Medical Record Patient Account Number: [id number] Number: Treating RN: Ahmed Prima 1919-10-04 (83 y.o. Other Clinician: Date of Birth/Sex: Female) Treating ROBSON, [place] Primary Care Travonne Schowalter: [place] Tylyn Derwin/Extender: G Referring Lamica Mccart: Vinson Moselle in Treatment: 29 Visit Information History Since Last Visit All ordered tests and consults were completed: No Patient Arrived: Ambulatory Added or deleted any medications: No Arrival Time: 12:45 Any new allergies or adverse reactions: No Accompanied By: daughter Had a fall or experienced change in No Transfer Assistance: None activities of daily living that may affect Patient Identification Verified: Yes risk of falls: Secondary Verification Process Yes Signs or symptoms of abuse/neglect since last No Completed: visito Patient Requires Transmission- No Hospitalized since last visit: No Based Precautions: Has Dressing in Place as Prescribed: Yes Patient Has Alerts: Yes Pain Present Now: No Patient Alerts: 7/31 ABI: R-0.55 L-0.40 [facility] Electronic Signature(s) Signed: 11/01/2016 3:17:24 PM By: Alric Quan Entered By: Alric Quan on 11/01/2016 12:45:34 [name] ([id number]) -------------------------------------------------------------------------------- Encounter Discharge Information Details Patient Name: [name] Date of Service: 11/01/2016 12:30 PM Medical Record Patient Account Number: [id number] Number: Treating RN: Ahmed Prima 27-Jan-1919 (83 y.o. Other Clinician: Date of Birth/Sex: Female) Treating ROBSON, [place] Primary Care Sybrina Laning: [place] Tawnee Clegg/Extender: G Referring Makana Feigel: Vinson Moselle in Treatment: 29 Encounter Discharge Information  Items Discharge Pain Level: 0 Discharge Condition: Stable Ambulatory Status: Ambulatory Discharge Destination: Home Transportation: Private Auto Accompanied By: daughter Schedule Follow-up Appointment: Yes Medication Reconciliation completed and provided to Patient/Care Yes Ladoris Lythgoe: Provided on Clinical Summary of Care: 11/01/2016 Form Type Recipient Paper Patient [facility] Electronic Signature(s) Signed: 11/01/2016 3:17:24 PM By: Alric Quan Previous Signature: 11/01/2016 1:15:59 PM Version By: Ruthine Dose Entered By: Alric Quan on 11/01/2016 13:19:27 [name] ([id number]) -------------------------------------------------------------------------------- Lower Extremity Assessment Details Patient Name: [name] Date of Service: 11/01/2016 12:30 PM Medical Record Patient Account Number: [id number] Number: Treating RN: Ahmed Prima 1918-12-28 (83 y.o. Other Clinician: Date of Birth/Sex: Female) Treating ROBSON, MICHAEL Primary Care Kathia Covington: [place] Refugio Mcconico/Extender: G Referring Doneta Bayman: [place] Weeks in Treatment: 29 Vascular Assessment Pulses: Dorsalis Pedis Palpable: [Left:Yes] [Right:Yes] Posterior Tibial Extremity colors, hair growth, and conditions: Extremity Color: [Left:Red] [Right:Red] Temperature of Extremity: [Left:Warm] [Right:Warm] Capillary Refill: [Left:< 3 seconds] [Right:< 3 seconds] Electronic Signature(s) Signed: 11/01/2016 3:17:24 PM By: Alric Quan Entered By: Alric Quan on 11/01/2016 12:55:42 [name] ([id number]) -------------------------------------------------------------------------------- Multi Wound Chart Details Patient Name: [name] Date of Service: 11/01/2016 12:30 PM Medical Record Patient Account Number: [id number] Number: Treating RN: Ahmed Prima January 20, 1919 (83 y.o. Other Clinician: Date of Birth/Sex: Female) Treating ROBSON,  MICHAEL Primary Care Breeona Waid: [place] Seymour Pavlak/Extender: G Referring Delsie Amador: [place] Weeks in Treatment: 29 Vital Signs Height(in): 64 Pulse(bpm): 69 Weight(lbs): 158 Blood Pressure 144/56 (mmHg): Body Mass Index(BMI): 27 Temperature(F): 97.5 Respiratory Rate 16 (breaths/min): Photos: [N/A:N/A] Wound Location: Left Toe Fifth - Lateral Right Foot - Lateral N/A Wounding Event: Gradually Appeared Gradually Appeared N/A Primary Etiology: Arterial Insufficiency Ulcer Arterial Insufficiency Ulcer N/A Comorbid History: Cataracts, Hypertension, Cataracts, Hypertension, N/A Osteoarthritis Osteoarthritis Date Acquired: 04/07/2015 07/26/2016 N/A Weeks of Treatment: 29 13 N/A Wound Status: Healed - Epithelialized Open N/A Measurements L x W x D 0x0x0 0.4x0.5x0.2 N/A (cm) Area (cm) : 0 0.157 N/A Volume (cm) : 0 0.031 N/A % Reduction in Area: 100.00% -234.00% N/A % Reduction in Volume: 100.00% -520.00% N/A Classification: Partial Thickness Partial Thickness  N/A Exudate Amount: None Present Large N/A Exudate Type: N/A Serous N/A Exudate Color: N/A amber N/A Wound Margin: Thickened Distinct, outline attached N/A Granulation Amount: None Present (0%) None Present (0%) N/A Necrotic Amount: None Present (0%) Large (67-100%) N/A Exposed Structures: N/A [name], [name] ([id number]) Fascia: No Fascia: No Fat Layer (Subcutaneous Fat Layer (Subcutaneous Tissue) Exposed: No Tissue) Exposed: No Tendon: No Tendon: No Muscle: No Muscle: No Joint: No Joint: No Bone: No Bone: No Limited to Skin Limited to Skin Breakdown Breakdown Epithelialization: Large (67-100%) None N/A Debridement: N/A Debridement [id number]) Pre-procedure N/A 13:05 N/A Verification/Time Out Taken: Pain Control: N/A Lidocaine 4% Topical N/A Solution Tissue Debrided: N/A Fibrin/Slough, Exudates, N/A Subcutaneous Level: N/A Skin/Subcutaneous N/A Tissue Debridement Area (sq  N/A 0.2 N/A cm): Instrument: N/A Curette N/A Bleeding: N/A Minimum N/A Hemostasis Achieved: N/A Pressure N/A Procedural Pain: N/A 0 N/A Post Procedural Pain: N/A 0 N/A Debridement Treatment N/A Procedure was tolerated N/A Response: well Post Debridement N/A 0.4x0.5x0.2 N/A Measurements L x W x D (cm) Post Debridement N/A 0.031 N/A Volume: (cm) Periwound Skin Texture: Excoriation: No No Abnormalities Noted N/A Induration: No Callus: No Crepitus: No Rash: No Scarring: No Periwound Skin Maceration: Yes No Abnormalities Noted N/A Moisture: Dry/Scaly: No Periwound Skin Color: Mottled: Yes No Abnormalities Noted N/A Atrophie Blanche: No Cyanosis: No Ecchymosis: No Erythema: No Hemosiderin Staining: No [name], [name] ([id number]) Pallor: No Rubor: No Temperature: No Abnormality No Abnormality N/A Tenderness on Yes Yes N/A Palpation: Wound Preparation: Ulcer Cleansing: Ulcer Cleansing: N/A Rinsed/Irrigated with Rinsed/Irrigated with Saline Saline Topical Anesthetic Topical Anesthetic Applied: None Applied: Other: lidocaine 4% Procedures Performed: N/A Debridement N/A Treatment Notes Wound #4 (Right, Lateral Foot) 1. Cleansed with: Clean wound with Normal Saline 2. Anesthetic Topical Lidocaine 4% cream to wound bed prior to debridement 4. Dressing Applied: Iodosorb Ointment 5. Secondary Dressing Applied Bordered Foam Dressing Dry Gauze Electronic Signature(s) Signed: 11/01/2016 4:44:13 PM By: [name] MD Entered By: [name] on 11/01/2016 13:34:47 [name] ([id number]) -------------------------------------------------------------------------------- Multi-Disciplinary Care Plan Details Patient Name: [name] Date of Service: 11/01/2016 12:30 PM Medical Record Patient Account Number: [id number] Number: Treating RN: Ahmed Prima 11-07-1918 (83 y.o. Other Clinician: Date of Birth/Sex: Female) Treating ROBSON,  [place] Primary Care Angellynn Kimberlin: [place] Janet Decesare/Extender: G Referring Gardy Montanari: Vinson Moselle in Treatment: 29 Active Inactive ` Abuse / Safety / Falls / Self Care Management Nursing Diagnoses: Potential for falls Goals: Patient will remain injury free Date Initiated: 04/06/2016 Target Resolution Date: 12/22/2016 Goal Status: Active Interventions: Assess fall risk on admission and as needed Notes: ` Nutrition Nursing Diagnoses: Imbalanced nutrition Goals: Patient/caregiver agrees to and verbalizes understanding of need to use nutritional supplements and/or vitamins as prescribed Date Initiated: 04/06/2016 Target Resolution Date: 12/22/2016 Goal Status: Active Interventions: Assess patient nutrition upon admission and as needed per policy Notes: ` Orientation to the Wound Care Program [place] ([id number]) Nursing Diagnoses: Knowledge deficit related to the wound healing center program Goals: Patient/caregiver will verbalize understanding of the [place] Date Initiated: 04/06/2016 Target Resolution Date: 12/22/2016 Goal Status: Active Interventions: Provide education on orientation to the wound center Notes: ` Pain, Acute or Chronic Nursing Diagnoses: Pain, acute or chronic: actual or potential Potential alteration in comfort, pain Goals: Patient will verbalize adequate pain control and receive pain control interventions during procedures as needed Date Initiated: 04/06/2016 Target Resolution Date: 12/22/2016 Goal Status: Active Interventions: Assess comfort goal upon admission Complete pain assessment as per visit requirements Notes: ` Wound/Skin  Impairment Nursing Diagnoses: Impaired tissue integrity Goals: Ulcer/skin breakdown will have a volume reduction of 30% by week 4 Date Initiated: 04/06/2016 Target Resolution Date: 12/22/2016 Goal Status: Active Ulcer/skin breakdown will have a volume reduction of 50%  by week 8 Date Initiated: 04/06/2016 Target Resolution Date: 12/22/2016 Goal Status: Active Ulcer/skin breakdown will have a volume reduction of 80% by week 12 [name], [name] ([id number]) Date Initiated: 04/06/2016 Target Resolution Date: 12/22/2016 Goal Status: Active Interventions: Assess patient/caregiver ability to obtain necessary supplies Assess ulceration(s) every visit Notes: Electronic Signature(s) Signed: 11/01/2016 3:17:24 PM By: Alric Quan Entered By: Alric Quan on 11/01/2016 13:05:21 [name] ([id number]) -------------------------------------------------------------------------------- Pain Assessment Details Patient Name: [name] Date of Service: 11/01/2016 12:30 PM Medical Record Patient Account Number: [id number] Number: Treating RN: Ahmed Prima 23-Feb-1919 (83 y.o. Other Clinician: Date of Birth/Sex: Female) Treating ROBSON, MICHAEL Primary Care Lestine Rahe: [place] Liseth Wann/Extender: G Referring Jeyda Siebel: [place] Weeks in Treatment: 29 Active Problems Location of Pain Severity and Description of Pain Patient Has Paino No Site Locations With Dressing Change: No Pain Management and Medication Current Pain Management: Electronic Signature(s) Signed: 11/01/2016 3:17:24 PM By: Alric Quan Entered By: Alric Quan on 11/01/2016 12:45:41 [name] ([id number]) -------------------------------------------------------------------------------- Patient/Caregiver Education Details Patient Name: [name] Date of Service: 11/01/2016 12:30 PM Medical Record Patient Account Number: [id number] Number: Treating RN: Carolyne Fiscal, Debi November 14, 1918 (83 y.o. Other Clinician: Date of Birth/Gender: Female) Treating ROBSON, [place] Primary Care Physician: [place] Physician/Extender: G Referring Physician: Vinson Moselle in Treatment: 29 Education Assessment Education  Provided To: Patient Education Topics Provided Wound/Skin Impairment: Handouts: Other: change dressings as directed Methods: Demonstration, Explain/Verbal Responses: State content correctly Electronic Signature(s) Signed: 11/01/2016 3:17:24 PM By: Alric Quan Entered By: Alric Quan on 11/01/2016 13:19:39 [name] ([id number]) -------------------------------------------------------------------------------- Wound Assessment Details Patient Name: [name] Date of Service: 11/01/2016 12:30 PM Medical Record Patient Account Number: [id number] Number: Treating RN: Ahmed Prima [birth year] (83 y.o. Other Clinician: Date of Birth/Sex: Female) Treating ROBSON, [place] Primary Care Tabor Denham: [place] Delon Revelo/Extender: G Referring Gabriellia Rempel: [place] Weeks in Treatment: 29 Wound Status Wound Number: 2 Primary Arterial Insufficiency Ulcer Etiology: Wound Location: Left Toe Fifth - Lateral Wound Status: Healed - Epithelialized Wounding Event: Gradually Appeared Comorbid Cataracts, Hypertension, Date Acquired: 04/07/2015 History: Osteoarthritis Weeks Of Treatment: 29 Clustered Wound: No Photos Wound Measurements Length: (cm) 0 % Reduction in Width: (cm) 0 % Reduction in Depth: (cm) 0 Epithelializat Area: (cm) 0 Tunneling: Volume: (cm) 0 Undermining: Area: 100% Volume: 100% ion: Large (67-100%) No No Wound Description Classification: Partial Thickness Foul Odor Afte Wound Margin: Thickened Slough/Fibrino Exudate Amount: None Present r Cleansing: No No Wound Bed Granulation Amount: None Present (0%) Exposed Structure Necrotic Amount: None Present (0%) Fascia Exposed: No Fat Layer (Subcutaneous Tissue) Exposed: No Tendon Exposed: No Muscle Exposed: No Joint Exposed: No Bone Exposed: No Limited to Skin Breakdown [name], [name] ([id number]) Periwound Skin Texture Texture Color No Abnormalities Noted: No No  Abnormalities Noted: No Callus: No Atrophie Blanche: No Crepitus: No Cyanosis: No Excoriation: No Ecchymosis: No Induration: No Erythema: No Rash: No Hemosiderin Staining: No Scarring: No Mottled: Yes Pallor: No Moisture Rubor: No No Abnormalities Noted: No Dry / Scaly: No Temperature / Pain Maceration: Yes Temperature: No Abnormality Tenderness on Palpation: Yes Wound Preparation Ulcer Cleansing: Rinsed/Irrigated with Saline Topical Anesthetic Applied: None Electronic Signature(s) Signed: 11/01/2016 3:17:24 PM By: Alric Quan Entered By: Alric Quan on 11/01/2016 13:08:32 [name] ([id number]) -------------------------------------------------------------------------------- Wound Assessment Details Patient Name: [name]  Date of Service: 11/01/2016 12:30 PM Medical Record Patient Account Number: [id number] Number: Treating RN: Ahmed Prima 1919/09/12 (83 y.o. Other Clinician: Date of Birth/Sex: Female) Treating ROBSON, [place] Primary Care Majestic Brister: [place] Usher Hedberg/Extender: G Referring Caroleen Stoermer: [place] Weeks in Treatment: 29 Wound Status Wound Number: 4 Primary Arterial Insufficiency Ulcer Etiology: Wound Location: Right Foot - Lateral Wound Status: Open Wounding Event: Gradually Appeared Comorbid Cataracts, Hypertension, Date Acquired: 07/26/2016 History: Osteoarthritis Weeks Of Treatment: 13 Clustered Wound: No Photos Photo Uploaded By: Alric Quan on 11/01/2016 13:03:36 Wound Measurements Length: (cm) 0.4 Width: (cm) 0.5 Depth: (cm) 0.2 Area: (cm) 0.157 Volume: (cm) 0.031 % Reduction in Area: -234% % Reduction in Volume: -520% Epithelialization: None Tunneling: No Undermining: No Wound Description Classification: Partial Thickness Foul Odor Afte Wound Margin: Distinct, outline attached Exudate Amount: Large Exudate Type: Serous Exudate Color: amber r Cleansing: No Wound  Bed Granulation Amount: None Present (0%) Exposed Structure Necrotic Amount: Large (67-100%) Fascia Exposed: No Necrotic Quality: Adherent Slough Fat Layer (Subcutaneous Tissue) Exposed: No Tendon Exposed: No Muscle Exposed: No [name], [name] ([id number]) Joint Exposed: No Bone Exposed: No Limited to Skin Breakdown Periwound Skin Texture Texture Color No Abnormalities Noted: No No Abnormalities Noted: No Moisture Temperature / Pain No Abnormalities Noted: No Temperature: No Abnormality Tenderness on Palpation: Yes Wound Preparation Ulcer Cleansing: Rinsed/Irrigated with Saline Topical Anesthetic Applied: Other: lidocaine 4%, Treatment Notes Wound #4 (Right, Lateral Foot) 1. Cleansed with: Clean wound with Normal Saline 2. Anesthetic Topical Lidocaine 4% cream to wound bed prior to debridement 4. Dressing Applied: Iodosorb Ointment 5. Secondary Dressing Applied Bordered Foam Dressing Dry Gauze Electronic Signature(s) Signed: 11/01/2016 3:17:24 PM By: Alric Quan Entered By: Alric Quan on 11/01/2016 12:54:07 [name] ([id number]) -------------------------------------------------------------------------------- [place] Details Patient Name: [name] Date of Service: 11/01/2016 12:30 PM Medical Record Patient Account Number: [id number] Number: Treating RN: Ahmed Prima 1919/09/06 (83 y.o. Other Clinician: Date of Birth/Sex: Female) Treating ROBSON, MICHAEL Primary Care Emie Sommerfeld: [place] Hong Moring/Extender: G Referring Kiyan Burmester: [place] Weeks in Treatment: 29 Vital Signs Time Taken: 12:48 Temperature (F): 97.5 Height (in): 64 Pulse (bpm): 69 Weight (lbs): 158 Respiratory Rate (breaths/min): 16 Body Mass Index (BMI): 27.1 Blood Pressure (mmHg): 144/56 Reference Range: 80 - 120 mg / dl Electronic Signature(s) Signed: 11/01/2016 3:17:24 PM By: Alric Quan Entered By: Alric Quan on 11/01/2016  12:48:47

## 2016-11-02 NOTE — Progress Notes (Signed)
HALENE, VANVICKLE ([id number]) Visit Report for 11/01/2016 Chief Complaint Document Details Patient Name: [name], [name] Date of Service: 11/01/2016 12:30 PM Medical Record Patient Account Number: [id number] Number: Treating RN: Ahmed Prima 08-21-1919 (83 y.o. Other Clinician: Date of Birth/Sex: Female) Treating Madonna Flegal Primary Care Provider: Lorelee Market Provider/Extender: G Referring Provider: Vinson Moselle in Treatment: 29 Information Obtained from: Patient Chief Complaint here for follow-up on right lateral foot ulcer and left foot ulcers Electronic Signature(s) Signed: 11/01/2016 4:44:13 PM By: [name] MD Entered By: [name] on 11/01/2016 13:35:11 [name] ([id number]) -------------------------------------------------------------------------------- Debridement Details Patient Name: [name] Date of Service: 11/01/2016 12:30 PM Medical Record Patient Account Number: [id number] Number: Treating RN: Carolyne Fiscal, Debi 11-19-1918 (83 y.o. Other Clinician: Date of Birth/Sex: Female) Treating Marybelle Giraldo, J[place] Primary Care Provider: Lorelee Market Provider/Extender: G Referring Provider: Vinson Moselle in Treatment: 29 Debridement Performed for Wound #4 Right,Lateral Foot Assessment: Performed By: Physician Ricard Dillon, MD Debridement: Debridement Pre-procedure Yes - 13:05 Verification/Time Out Taken: Start Time: 13:06 Pain Control: Lidocaine 4% Topical Solution Level: Skin/Subcutaneous Tissue Total Area Debrided (L x 0.4 (cm) x 0.5 (cm) = 0.2 (cm) W): Tissue and other Viable, Non-Viable, Exudate, Fibrin/Slough, Subcutaneous material debrided: Instrument: Curette Bleeding: Minimum Hemostasis Achieved: Pressure End Time: 13:07 Procedural Pain: 0 Post Procedural Pain: 0 Response to Treatment: Procedure was tolerated well Post Debridement Measurements of Total Wound Length:  (cm) 0.4 Width: (cm) 0.5 Depth: (cm) 0.2 Volume: (cm) 0.031 Character of Wound/Ulcer Post Requires Further Debridement Debridement: Severity of Tissue Post Debridement: Fat layer exposed Post Procedure Diagnosis Same as Pre-procedure Electronic Signature(s) Signed: 11/01/2016 3:17:24 PM By: Alric Quan Signed: 11/01/2016 4:44:13 PM By: [name] MD Harmony, Estill Bamberg ([id number]) Entered By: [name] on 11/01/2016 13:35:01 [name] ([id number]) -------------------------------------------------------------------------------- HPI Details Patient Name: [name] Date of Service: 11/01/2016 12:30 PM Medical Record Patient Account Number: [id number] Number: Treating RN: Carolyne Fiscal, Debi 28-Dec-1918 (83 y.o. Other Clinician: Date of Birth/Sex: Female) Treating Rayanne Padmanabhan Primary Care Provider: Lorelee Market Provider/Extender: G Referring Provider: Lorelee Market Weeks in Treatment: 29 History of Present Illness HPI Description: 04/06/16; this is a 83 year old woman who arrives accompanied by 2 daughters for a wound on her left ankle and her left fifth toe. These have apparently been present for a year. I'm not quite certain how she came to this clinic however she was being followed by Sharlotte Alamo her podiatrist for these wounds. She was also referred to [facility] and they apparently did a test presumably arterial studies although we don't have any of these results and we couldn't get through to the office today. The family but has been applying a combination of Bactroban and a light bandage and perhaps more recently Silvadene cream. She did have an x-ray of the foot roughly 6 months ago at the podiatry office the family was unaware that if there were any abnormalities. Apparently they have not seen any healing here. Our intake nurse noted a slight skin tear on the right anterior lower leg. Nobody seemed aware of this.  ABIs calculated in this clinic was 0.3 on the right and 0.4 on the left I have reviewed things [facility]. There is very little information on this patient. She apparently follows in current total clinic which we don't have information from. She has mentioned already been to a [facility]. She has a history of hypothyroidism, nephrolithiasis arthritis and has had a previous mastectomy. 04/13/16; patient's x-ray was normal. She is already  been to see Dr. Delana Meyer vascular surgery. Her arterial exam was from November 2016 this showed a left ABI of 0.62 her right of 0.76. Her duplex ultrasound of the left leg showed biphasic waves in the common femoral and distal femoral artery however monophasic waves in the superficial femoral artery proximal and mid biphasic it distal. 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 The patient tells me that she has pain at night when she tries to lie down which is improved by getting up and sitting in the chair this sounds like claudication at rest. Her wounds are on the left medial malleolus and the dorsal fifth toe small punched out wounds that are right on bone. We use Santyl last week 04/20/16: nurse informed me pt has declined evaluation for significant PAD. she denies systemic s/s of infections. 04/27/16;; the patient has had noninvasive arterial studies done in November 2016. ABI and the left was 0.62. Monophasic waves at the superficial femoral artery. Occluded to the posterior tibial artery. So had greater than 50% stenosis of the right superficial femoral and greater than 50% stenosis of the left superficial femoral artery. She had bilateral tibial peroneal artery disease. Both of the wounds on the left fifth toe and left lateral malleolus have been present for more than a year. They have been to see vein and vascular. The patient has some pain but miraculously I think the wounds have largely been stable. No evidence of infection 05/04/16; she goes for a  noninvasive study tomorrow and then sees Dr. Fletcher Anon on Monday. By the time she is here next week we should have a better picture of whether something can be done with regards to her arterial flow. We continue to have ischemic-looking wounds on the left fifth toe and left lateral malleolus. 05/10/16; the patient went for her arterial studies and saw Dr. Fletcher Anon. As predicted she is felt to have critical limb ischemia. The feeling is that she has occlusion of the SFA. The feeling would she would be a Coppin, Dakotah ([id number]) candidate for a stent to the SFA. The patient did not make a decision to proceed with the procedure and she is here with family members to discuss this me today. He shouldn't has a lot of pain and cannot sleep and rest well at night per her family. 05/24/16; the patient went and had a complex revascularization/angioplasty of the left superficial femoral artery followed by drug-coated balloon angioplasty and spots stenting. She tolerated the procedure well. She was recommended for dual antiplatelet drugs with Plavix and aspirin for at least a month. She went yesterday for I believe follow-up serial Dopplers and ABIs although I don't see these results. The patient is unfortunately complaining of a lot of pain in her bilateral lower legs below the knees from the ankle to the knees. She apparently was prescribed lidocaine and apparently put this on her legs instead of over the wound areas. This may have something to do with it however there is a lot of edema in her bilateral legs I was able to find her arterial studies from yesterday. The left ABI has improved up to 0.66 post left SFA stent. The bilateral great toe indices remain abnormal with the left being in the 0.25 range. Duplex ultrasound showed her left SFA stent is patent monophasic waveforms persist in the left leg 06/07/16; continued punched-out areas over the dorsal left fifth toe and left lateral malleolus. No  major improvement 06/28/16; the areas over her dorsal left fifth toe and left  lateral malleolus are covered in surface slough we are using Iodoflex 07/05/16. We have been using Iodoflex for 2-3 weeks now. I have not been debridement is because of pain 07/19/16 currently we have been using Iodoflex for roughly the past month. Previously we were unable to debride due to pain although today patient states that the pain is not nearly as severe as it has been in the past. In fact she rates this to be a 1 out of 10 and it worse to a to 10 with palpation of the wound. All and all her and her daughter feel like this is actually doing steadily better at this point in time. She is pleased with progress and we have been seeing her every 2 weeks. 08/02/16; this is a delightful 83 year old woman I have not seen in over a month. She has arterial insufficiency wounds remaining over the left lateral malleolus and the left fifth toe. With the help of Dr. Fletcher Anon we are able to get her revascularized on the left. The 2 wounds on the left have been making good progress and are definitely smaller especially the area over the left lateral malleolus. She is certainly in a lot less pain than she used to be although I still think there is some claudication type pain. Unfortunately she is developed a area on the right lateral foot which is a small open area but I think is threatened. I suspect a small ischemic areas well. Also on her right fifth toe there is an area that is not open but looks as though it is receiving too much pressure from footwear. Finally an area over her right malleolus although I don't think this is on its way to anything ominous 08/17/16 patient presents today for follow up evaluation she tells me that she is really doing fairly well from a pain standpoint compared to where she has been previous. She is tolerating the dressing changes without any complication. She continues to have discharge and  drainage however. 08-30-16 Ms. Hribar presents today with her daughter, she states that she continues to have intermittent shooting pains to the left lateral fifth toe at this site of seems to be a healed ulcer. She denies any other issues that her wound related since her last appointment. Her daughter states that she is in need of a tramadol refill at this time as she uses half a tablet at at bedtime to aid in sleep due to foot pain. Iodoflex has been used on all wounds in previous dressing changes. 09/13/16; the patient has ischemic wounds in her feet. The area over the left lateral malleolus is healed and the area over the left fifth toe looks improved.. She has an area on the lateral aspect of her right foot which is a small but deep wound. Finally she has a new open area on the medial aspect of the left medial malleolus. This is superficial 09/27/16; the area over the left lateral malleolus remains healed. The area over the left fifth toe has a surface and she states the pain is better but I don't think this is completely closed. She has an area on the lateral aspect of her right foot is a small but deep wound. The new open area on the medial aspect of the [facility], Rakeb ([id number]) left ankle was closed from last time. 10/18/16; the area over her left lateral malleolus remains healed. The area over the left fifth toe has a surface over the top of this however there is no  overt open area. Given the underlying issues of severe PAD and continued pain in the toe I would think it would be unlikely this is truly healed however I am not planning to debridement this area. The area on the right lateral foot which was a more recent wound is a small punched out painful area again has significant surface slough and nonviable tissue. It is clear the patient still has claudication type pain however she remains functional. I have been giving her tramadol when necessary and that seems to help a lot with  her pain the patient follows up with Dr. Fletcher Anon on January 18/18 11/01/16; patient missed her follow-up with Dr. Fletcher Anon last week due to a snow day. Follow-up is now on February 15. The areas on the right lateral malleolus and dorsal right fifth toe remain closed over. The fifth toe was tentative is there is a surface eschar however I'm not going to disturb this. Therefore, her only open areas on the right lateral foot. This is a small punched out area. We have been using Prisma. I'm quite convinced this is an ischemic wound Electronic Signature(s) Signed: 11/01/2016 4:44:13 PM By: [name] MD Entered By: [name] on 11/01/2016 13:36:57 [name] ([id number]) -------------------------------------------------------------------------------- Physical Exam Details Patient Name: [name] Date of Service: 11/01/2016 12:30 PM Medical Record Patient Account Number: [id number] Number: Treating RN: Ahmed Prima 07/17/1919 (83 y.o. Other Clinician: Date of Birth/Sex: Female) Treating Artha Chiasson Primary Care Provider: Lorelee Market Provider/Extender: G Referring Provider: Lorelee Market Weeks in Treatment: 29 Constitutional Sitting or standing Blood Pressure is within target range for patient.. Pulse regular and within target range for patient.Marland Kitchen Respirations regular, non-labored and within target range.. Temperature is normal and within the target range for the patient.. Patient's appearance is neat and clean. Appears in no acute distress. Well nourished and well developed.Marland Kitchen Respiratory Respiratory effort is easy and symmetric bilaterally. Rate is normal at rest and on room air.. Cardiovascular Pedal pulses absent bilaterally.. Notes Wound exam; the left foot including the left lateral ankle, dorsal left fifth toe remaining closed over. There is still eschar over the left fifth toe however I'm not going to disturb this. This is a functional  cover over an ischemic wound, whether there is actual skin underneath this or not I am unsure. On the right lateral foot she has a small punched out area. This requires debridement of necrotic surface tissue with a #3 curet. No evidence of surrounding infection Electronic Signature(s) Signed: 11/01/2016 4:44:13 PM By: [name] MD Entered By: [name] on 11/01/2016 13:38:20 [name] ([id number]) -------------------------------------------------------------------------------- Physician Orders Details Patient Name: [name] Date of Service: 11/01/2016 12:30 PM Medical Record Patient Account Number: [id number] Number: Treating RN: Ahmed Prima 05-02-1919 (83 y.o. Other Clinician: Date of Birth/Sex: Female) Treating Isiaha Greenup Primary Care Provider: Lorelee Market Provider/Extender: G Referring Provider: Vinson Moselle in Treatment: 29 Verbal / Phone Orders: Yes Clinician: Carolyne Fiscal, Debi Read Back and Verified: Yes Diagnosis Coding Wound Cleansing Wound #4 Right,Lateral Foot o Clean wound with Normal Saline. - for clinic use o Cleanse wound with mild soap and water o May Shower, gently pat wound dry prior to applying new dressing. Anesthetic Wound #4 Right,Lateral Foot o Topical Lidocaine 4% cream applied to wound bed prior to debridement - for clinic use Skin Barriers/Peri-Wound Care Wound #4 Right,Lateral Foot o Skin Prep Primary Wound Dressing Wound #4 Right,Lateral Foot o Iodosorb Ointment Secondary Dressing Wound #4 Right,Lateral Foot o Dry Gauze o Boardered  Foam Dressing Dressing Change Frequency Wound #4 Right,Lateral Foot o Change dressing every other day. Follow-up Appointments Wound #4 Right,Lateral Foot o Return Appointment in 2 weeks. Edema Control Wound #4 Right,Lateral Foot [name], [name] ([id number]) o Elevate legs to the level of the heart and pump ankles as often as  possible o Other: - compression stockings Additional Orders / Instructions Wound #4 Right,Lateral Foot o Increase protein intake. Medications-please add to medication list. Wound #4 Right,Lateral Foot o Other: - Vitamin C, Zinc, Multivitamins Tramadol Electronic Signature(s) Signed: 11/01/2016 3:17:24 PM By: Alric Quan Signed: 11/01/2016 4:44:13 PM By: [name] MD Entered By: Alric Quan on 11/01/2016 13:09:07 [name] ([id number]) -------------------------------------------------------------------------------- Problem List Details Patient Name: [name] Date of Service: 11/01/2016 12:30 PM Medical Record Patient Account Number: [id number] Number: Treating RN: Carolyne Fiscal, Debi 12-13-1918 (83 y.o. 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  Other Clinician: Date of Birth/Sex: Female) Treating Shawndell Schillaci, [place] Primary Care Provider: Lorelee Market Provider/Extender: G Referring Provider: Vinson Moselle in Treatment: 29 Subjective Chief Complaint Information obtained from Patient here for follow-up on right lateral foot ulcer and left foot ulcers History of Present Illness (HPI) 04/06/16; this is a 83 year old woman who arrives accompanied by 2 daughters for a wound on her left ankle and her left fifth toe. These have apparently been present for a year. I'm not quite certain how she came to this clinic however she was being followed by Sharlotte Alamo her podiatrist for these wounds. She was also referred to [facility] and they apparently did a test presumably arterial studies although we don't have any of these results and we couldn't get through to the office today. The family but has been applying a combination of Bactroban and a light bandage and perhaps more recently Silvadene cream. She did have an x-ray of the foot roughly 6 months ago at the podiatry office the family was unaware that if there were any abnormalities. Apparently they have not seen any healing here. Our intake nurse noted a slight skin tear on the right anterior lower leg. Nobody seemed aware of this. ABIs calculated in this clinic was 0.3 on the right and 0.4 on the left I have reviewed things [facility]. There is very little information on this patient. She apparently follows in current total clinic which we don't have information from. She has mentioned already been to a [facility]. She has a history of hypothyroidism, nephrolithiasis arthritis and has had a previous mastectomy. 04/13/16; patient's x-ray was normal. She is already been to see Dr. Delana Meyer vascular surgery. Her arterial exam was from November 2016 this showed a left ABI of 0.62 her right  of 0.76. Her duplex ultrasound of the left leg showed biphasic waves in the  common femoral and distal femoral artery however monophasic waves in the superficial femoral artery proximal and mid biphasic it distal. 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 The patient tells me that she has pain at night when she tries to lie down which is improved by getting up and sitting in the chair this sounds like claudication at rest. Her wounds are on the left medial malleolus and the dorsal fifth toe small punched out wounds that are right on bone. We use Santyl last week 04/20/16: nurse informed me pt has declined evaluation for significant PAD. she denies systemic s/s of infections. 04/27/16;; the patient has had noninvasive arterial studies done in November 2016. ABI and the left was 0.62. Monophasic waves at the superficial femoral artery. Occluded to the posterior tibial artery. So had greater than 50% stenosis of the right superficial femoral and greater than 50% stenosis of the left superficial femoral artery. She had bilateral tibial peroneal artery disease. Both of the wounds on the left fifth toe and left lateral malleolus have been present for more than a year. They have been to see vein and vascular. The patient has some pain but miraculously I think the wounds have largely been stable. No [name], [name] ([id number]) evidence of infection 05/04/16; she goes for a noninvasive study tomorrow and then sees Dr. Fletcher Anon on Monday. By the time she is here next week we should have a better picture of whether something can be done with regards to her arterial flow. We continue to have ischemic-looking wounds on the left fifth toe and left lateral malleolus. 05/10/16; the patient went for her arterial studies and saw Dr. Fletcher Anon. As predicted she is felt to have critical limb ischemia. The feeling is that she has occlusion of the SFA. The feeling would she would be a candidate for a stent to the SFA. The patient did not make a decision to proceed with the procedure and she is here  with family members to discuss this me today. He shouldn't has a lot of pain and cannot sleep and rest well at night per her family. 05/24/16; the patient went and had a complex revascularization/angioplasty of the left superficial femoral artery followed by drug-coated balloon angioplasty and spots stenting. She tolerated the procedure well. She was recommended for dual antiplatelet drugs with Plavix and aspirin for at least a month. She went yesterday for I believe follow-up serial Dopplers and ABIs although I don't see these results. The patient is unfortunately complaining of a lot of pain in her bilateral lower legs below the knees from the ankle to the knees. She apparently was prescribed lidocaine and apparently put this on her legs instead of over the wound areas. This may have something to do with it however there is a lot of edema in her bilateral legs I was able to find her arterial studies from yesterday. The left ABI has improved up to 0.66 post left SFA stent. The bilateral great toe indices remain abnormal with the left being in the 0.25 range. Duplex ultrasound showed her left SFA stent is patent monophasic waveforms persist in the left leg 06/07/16; continued punched-out areas over the dorsal left fifth toe and left lateral malleolus. No major improvement 06/28/16; the areas over her dorsal left fifth toe and left lateral malleolus are covered in surface slough we are using Iodoflex 07/05/16. We have been using Iodoflex for 2-3 weeks now. I have  not been debridement is because of pain 07/19/16 currently we have been using Iodoflex for roughly the past month. Previously we were unable to debride due to pain although today patient states that the pain is not nearly as severe as it has been in the past. In fact she rates this to be a 1 out of 10 and it worse to a to 10 with palpation of the wound. All and all her and her daughter feel like this is actually doing steadily better at this  point in time. She is pleased with progress and we have been seeing her every 2 weeks. 08/02/16; this is a delightful 83 year old woman I have not seen in over a month. She has arterial insufficiency wounds remaining over the left lateral malleolus and the left fifth toe. With the help of Dr. Fletcher Anon we are able to get her revascularized on the left. The 2 wounds on the left have been making good progress and are definitely smaller especially the area over the left lateral malleolus. She is certainly in a lot less pain than she used to be although I still think there is some claudication type pain. Unfortunately she is developed a area on the right lateral foot which is a small open area but I think is threatened. I suspect a small ischemic areas well. Also on her right fifth toe there is an area that is not open but looks as though it is receiving too much pressure from footwear. Finally an area over her right malleolus although I don't think this is on its way to anything ominous 08/17/16 patient presents today for follow up evaluation she tells me that she is really doing fairly well from a pain standpoint compared to where she has been previous. She is tolerating the dressing changes without any complication. She continues to have discharge and drainage however. 08-30-16 Ms. Sorter presents today with her daughter, she states that she continues to have intermittent shooting pains to the left lateral fifth toe at this site of seems to be a healed ulcer. She denies any other issues that her wound related since her last appointment. Her daughter states that she is in need of a tramadol refill at this time as she uses half a tablet at at bedtime to aid in sleep due to foot pain. Iodoflex has been used on all wounds in previous dressing changes. 09/13/16; the patient has ischemic wounds in her feet. The area over the left lateral malleolus is healed and Coss, Pasty ([id number]) the area over  the left fifth toe looks improved.. She has an area on the lateral aspect of her right foot which is a small but deep wound. Finally she has a new open area on the medial aspect of the left medial malleolus. This is superficial 09/27/16; the area over the left lateral malleolus remains healed. The area over the left fifth toe has a surface and she states the pain is better but I don't think this is completely closed. She has an area on the lateral aspect of her right foot is a small but deep wound. The new open area on the medial aspect of the left ankle was closed from last time. 10/18/16; the area over her left lateral malleolus remains healed. The area over the left fifth toe has a surface over the top of this however there is no overt open area. Given the underlying issues of severe PAD and continued pain in the toe I would think it would be  unlikely this is truly healed however I am not planning to debridement this area. The area on the right lateral foot which was a more recent wound is a small punched out painful area again has significant surface slough and nonviable tissue. It is clear the patient still has claudication type pain however she remains functional. I have been giving her tramadol when necessary and that seems to help a lot with her pain the patient follows up with Dr. Fletcher Anon on January 18/18 11/01/16; patient missed her follow-up with Dr. Fletcher Anon last week due to a snow day. Follow-up is now on February 15. The areas on the right lateral malleolus and dorsal right fifth toe remain closed over. The fifth toe was tentative is there is a surface eschar however I'm not going to disturb this. Therefore, her only open areas on the right lateral foot. This is a small punched out area. We have been using Prisma. I'm quite convinced this is an ischemic wound Objective Constitutional Sitting or standing Blood Pressure is within target range for patient.. Pulse regular and within target  range for patient.Marland Kitchen Respirations regular, non-labored and within target range.. Temperature is normal and within the target range for the patient.. Patient's appearance is neat and clean. Appears in no acute distress. Well nourished and well developed.. Vitals Time Taken: 12:48 PM, Height: 64 in, Weight: 158 lbs, BMI: 27.1, Temperature: 97.5 F, Pulse: 69 bpm, Respiratory Rate: 16 breaths/min, Blood Pressure: 144/56 mmHg. Respiratory Respiratory effort is easy and symmetric bilaterally. Rate is normal at rest and on room air.. Cardiovascular Pedal pulses absent bilaterally.. General Notes: Wound exam; the left foot including the left lateral ankle, dorsal left fifth toe remaining closed over. There is still eschar over the left fifth toe however I'm not going to disturb this. This is a functional cover over an ischemic wound, whether there is actual skin underneath this or not I am unsure. On the right lateral foot she has a small punched out area. This requires debridement of necrotic surface tissue with a #3 curet. No evidence of surrounding infection [name], [name] ([id number]) Integumentary (Hair, Skin) Wound #2 status is Healed - Epithelialized. Original cause of wound was Gradually Appeared. The wound is located on the Left,Lateral Toe Fifth. The wound measures 0cm length x 0cm width x 0cm depth; 0cm^2 area and 0cm^3 volume. The wound is limited to skin breakdown. There is no tunneling or undermining noted. There is a none present amount of drainage noted. The wound margin is thickened. There is no granulation within the wound bed. There is no necrotic tissue within the wound bed. The periwound skin appearance exhibited: Maceration, Mottled. The periwound skin appearance did not exhibit: Callus, Crepitus, Excoriation, Induration, Rash, Scarring, Dry/Scaly, Atrophie Blanche, Cyanosis, Ecchymosis, Hemosiderin Staining, Pallor, Rubor, Erythema. Periwound temperature was noted as No  Abnormality. The periwound has tenderness on palpation. Wound #4 status is Open. Original cause of wound was Gradually Appeared. The wound is located on the Right,Lateral Foot. The wound measures 0.4cm length x 0.5cm width x 0.2cm depth; 0.157cm^2 area and 0.031cm^3 volume. The wound is limited to skin breakdown. There is no tunneling or undermining noted. There is a large amount of serous drainage noted. The wound margin is distinct with the outline attached to the wound base. There is no granulation within the wound bed. There is a large (67-100%) amount of necrotic tissue within the wound bed including Adherent Slough. Periwound temperature was noted as No Abnormality. The periwound has tenderness on palpation.  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 There was a Skin/Subcutaneous Tissue Debridement [id number]) debridement with total area of 0.2 sq cm performed by Ricard Dillon, MD. with the following instrument(s): Curette to remove Viable and Non-Viable tissue/material including Exudate, Fibrin/Slough, and Subcutaneous after achieving pain control using Lidocaine 4% Topical Solution. A time out was conducted at 13:05, prior to the start of the procedure. A Minimum amount of bleeding was controlled with Pressure. The procedure was tolerated well with a pain level of 0 throughout and a pain level of 0 following the procedure. Post Debridement Measurements: 0.4cm length x 0.5cm width x 0.2cm depth; 0.031cm^3 volume. Character of Wound/Ulcer Post  Debridement requires further debridement. Severity of Tissue Post [name], [name] ([id number]) Debridement is: Fat layer exposed. Post procedure Diagnosis Wound #4: Same as Pre-Procedure Plan Wound Cleansing: Wound #4 Right,Lateral Foot: Clean wound with Normal Saline. - for clinic use Cleanse wound with mild soap and water May Shower, gently pat wound dry prior to applying new dressing. Anesthetic: Wound #4 Right,Lateral Foot: Topical Lidocaine 4% cream applied to wound bed prior to debridement - for clinic use Skin Barriers/Peri-Wound Care: Wound #4 Right,Lateral Foot: Skin Prep Primary Wound Dressing: Wound #4 Right,Lateral Foot: Iodosorb Ointment Secondary Dressing: Wound #4 Right,Lateral Foot: Dry Gauze Boardered Foam Dressing Dressing Change Frequency: Wound #4 Right,Lateral Foot: Change dressing every other day. Follow-up Appointments: Wound #4 Right,Lateral Foot: Return Appointment in 2 weeks. Edema Control: Wound #4 Right,Lateral Foot: Elevate legs to the level of the heart and pump ankles as often as possible Other: - compression stockings Additional Orders / Instructions: Wound #4 Right,Lateral Foot: Increase protein intake. Medications-please add to medication list.: Wound #4 Right,Lateral Foot: Other: - Vitamin C, Zinc, Multivitamins Tramadol [name], [name] ([id number]) #1 I have change the dressing on the right lateral foot to Iodosorb ointment border foam change every second day. This apparently did well on the left ankle wound. #2 follow-up with Dr. Mariea Clonts on February 15 Electronic Signature(s) Signed: 11/01/2016 4:44:13 PM By: [name] MD Entered By: [name] on 11/01/2016 13:39:24 [name] ([id number]) -------------------------------------------------------------------------------- [facility] Details Patient Name: [name] Date of Service: 11/01/2016 Medical Record Patient Account Number:  [id number] Number: Treating RN: Ahmed Prima 02/25/1919 (83 y.o. 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

## 2016-11-15 ENCOUNTER — Encounter: Payer: Medicare PPO | Attending: Internal Medicine | Admitting: Internal Medicine

## 2016-11-15 DIAGNOSIS — L97512 Non-pressure chronic ulcer of other part of right foot with fat layer exposed: Secondary | ICD-10-CM | POA: Insufficient documentation

## 2016-11-15 DIAGNOSIS — L97523 Non-pressure chronic ulcer of other part of left foot with necrosis of muscle: Secondary | ICD-10-CM | POA: Diagnosis not present

## 2016-11-15 DIAGNOSIS — I70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: Secondary | ICD-10-CM | POA: Diagnosis not present

## 2016-11-15 DIAGNOSIS — M1990 Unspecified osteoarthritis, unspecified site: Secondary | ICD-10-CM | POA: Insufficient documentation

## 2016-11-15 DIAGNOSIS — I70243 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: Secondary | ICD-10-CM | POA: Insufficient documentation

## 2016-11-15 DIAGNOSIS — E039 Hypothyroidism, unspecified: Secondary | ICD-10-CM | POA: Insufficient documentation

## 2016-11-17 NOTE — Progress Notes (Signed)
[name], [name] ([id number]) Visit Report for 11/15/2016 Chief Complaint Document Details Patient Name: [name], [name] Date of Service: 11/15/2016 11:00 AM Medical Record Patient Account Number: [id number] Number: Treating RN: Ahmed Prima 01-19-1919 (83 y.o. Other Clinician: Date of Birth/Sex: Female) Treating [name] Primary Care Provider: Lorelee Market Provider/Extender: G Referring Provider: Vinson Moselle in Treatment: 31 Information Obtained from: Patient Chief Complaint here for follow-up on right lateral foot ulcer and left foot ulcers Electronic Signature(s) Signed: 11/16/2016 4:28:51 PM By: [name] MD Entered By: [name] on 11/15/2016 13:09:12 [name] ([id number]) -------------------------------------------------------------------------------- HPI Details Patient Name: [name] Date of Service: 11/15/2016 11:00 AM Medical Record Patient Account Number: [id number] Number: Treating RN: Ahmed Prima [birth year] (83 y.o. Other Clinician: Date of Birth/Sex: Female) Treating Anari Evitt Primary Care Provider: Lorelee Market Provider/Extender: G Referring Provider: Vinson Moselle in Treatment: 31 History of Present Illness HPI Description: 04/06/16; this is a 83 year old woman who arrives accompanied by 2 daughters for a wound on her left ankle and her left fifth toe. These have apparently been present for a year. I'm not quite certain how she came to this clinic however she was being followed by [name] her podiatrist for these wounds. She was also referred to [facility] and they apparently did a test presumably arterial studies although we don't have any of these results and we couldn't get through to the office today. The family but has been applying a combination of Bactroban and a light bandage and perhaps more recently Silvadene cream. She did have an x-ray  of the foot roughly 6 months ago at the podiatry office the family was unaware that if there were any abnormalities. Apparently they have not seen any healing here. Our intake nurse noted a slight skin tear on the right anterior lower leg. Nobody seemed aware of this. ABIs calculated in this clinic was 0.3 on the right and 0.4 on the left I have reviewed things in [facility]. There is very little information on this patient. She apparently follows in current total clinic which we don't have information from. She has mentioned already been to a [facility]. She has a history of hypothyroidism, nephrolithiasis arthritis and has had a previous mastectomy. 04/13/16; patient's x-ray was normal. She is already been to see Dr. Delana Meyer vascular surgery. Her arterial exam was from November 2016 this showed a left ABI of 0.62 her right of 0.76. Her duplex ultrasound of the left leg showed biphasic waves in the common femoral and distal femoral artery however monophasic waves in the superficial femoral artery proximal and mid biphasic it distal. 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 The patient tells me that she has pain at night when she tries to lie down which is improved by getting up and sitting in the chair this sounds like claudication at rest. Her wounds are on the left medial malleolus and the dorsal fifth toe small punched out wounds that are right on bone. We use Santyl last week 04/20/16: nurse informed me pt has declined evaluation for significant PAD. she denies systemic s/s of infections. 04/27/16;; the patient has had noninvasive arterial studies done in November 2016. ABI and the left was 0.62. Monophasic waves at the superficial femoral artery. Occluded to the posterior tibial artery. So had greater than 50% stenosis of the right superficial femoral and greater than 50% stenosis of the left superficial femoral artery. She had bilateral tibial peroneal artery disease. Both of the wounds on the  left fifth toe and left lateral malleolus have been present for more than a year. They have been to see vein and vascular. The patient has some pain but miraculously I think the wounds have largely been stable. No evidence of infection 05/04/16; she goes for a noninvasive study tomorrow and then sees Dr. Fletcher Anon on Monday. By the time she is here next week we should have a better picture of whether something can be done with regards to her arterial flow. We continue to have ischemic-looking wounds on the left fifth toe and left lateral malleolus. 05/10/16; the patient went for her arterial studies and saw Dr. Fletcher Anon. As predicted she is felt to have critical limb ischemia. The feeling is that she has occlusion of the SFA. The feeling would she would be a [place], [place] ([id number]) candidate for a stent to the SFA. The patient did not make a decision to proceed with the procedure and she is here with family members to discuss this me today. He shouldn't has a lot of pain and cannot sleep and rest well at night per her family. 05/24/16; the patient went and had a complex revascularization/angioplasty of the left superficial femoral artery followed by drug-coated balloon angioplasty and spots stenting. She tolerated the procedure well. She was recommended for dual antiplatelet drugs with Plavix and aspirin for at least a month. She went yesterday for I believe follow-up serial Dopplers and ABIs although I don't see these results. The patient is unfortunately complaining of a lot of pain in her bilateral lower legs below the knees from the ankle to the knees. She apparently was prescribed lidocaine and apparently put this on her legs instead of over the wound areas. This may have something to do with it however there is a lot of edema in her bilateral legs I was able to find her arterial studies from yesterday. The left ABI has improved up to 0.66 post left SFA stent. The bilateral great toe indices  remain abnormal with the left being in the 0.25 range. Duplex ultrasound showed her left SFA stent is patent monophasic waveforms persist in the left leg 06/07/16; continued punched-out areas over the dorsal left fifth toe and left lateral malleolus. No major improvement 06/28/16; the areas over her dorsal left fifth toe and left lateral malleolus are covered in surface slough we are using Iodoflex 07/05/16. We have been using Iodoflex for 2-3 weeks now. I have not been debridement is because of pain 07/19/16 currently we have been using Iodoflex for roughly the past month. Previously we were unable to debride due to pain although today patient states that the pain is not nearly as severe as it has been in the past. In fact she rates this to be a 1 out of 10 and it worse to a to 10 with palpation of the wound. All and all her and her daughter feel like this is actually doing steadily better at this point in time. She is pleased with progress and we have been seeing her every 2 weeks. 08/02/16; this is a delightful 83 year old woman I have not seen in over a month. She has arterial insufficiency wounds remaining over the left lateral malleolus and the left fifth toe. With the help of Dr. Fletcher Anon we are able to get her revascularized on the left. The 2 wounds on the left have been making good progress and are definitely smaller especially the area over the left lateral malleolus. She is certainly in a lot less pain than  she used to be although I still think there is some claudication type pain. Unfortunately she is developed a area on the right lateral foot which is a small open area but I think is threatened. I suspect a small ischemic areas well. Also on her right fifth toe there is an area that is not open but looks as though it is receiving too much pressure from footwear. Finally an area over her right malleolus although I don't think this is on its way to anything ominous 08/17/16 patient presents  today for follow up evaluation she tells me that she is really doing fairly well from a pain standpoint compared to where she has been previous. She is tolerating the dressing changes without any complication. She continues to have discharge and drainage however. 08-30-16 Ms. Kehm presents today with her daughter, she states that she continues to have intermittent shooting pains to the left lateral fifth toe at this site of seems to be a healed ulcer. She denies any other issues that her wound related since her last appointment. Her daughter states that she is in need of a tramadol refill at this time as she uses half a tablet at at bedtime to aid in sleep due to foot pain. Iodoflex has been used on all wounds in previous dressing changes. 09/13/16; the patient has ischemic wounds in her feet. The area over the left lateral malleolus is healed and the area over the left fifth toe looks improved.. She has an area on the lateral aspect of her right foot which is a small but deep wound. Finally she has a new open area on the medial aspect of the left medial malleolus. This is superficial 09/27/16; the area over the left lateral malleolus remains healed. The area over the left fifth toe has a surface and she states the pain is better but I don't think this is completely closed. She has an area on the lateral aspect of her right foot is a small but deep wound. The new open area on the medial aspect of the [facility] ([id number]) left ankle was closed from last time. 10/18/16; the area over her left lateral malleolus remains healed. The area over the left fifth toe has a surface over the top of this however there is no overt open area. Given the underlying issues of severe PAD and continued pain in the toe I would think it would be unlikely this is truly healed however I am not planning to debridement this area. The area on the right lateral foot which was a more recent wound is a small  punched out painful area again has significant surface slough and nonviable tissue. It is clear the patient still has claudication type pain however she remains functional. I have been giving her tramadol when necessary and that seems to help a lot with her pain the patient follows up with Dr. Fletcher Anon on January 18/18 11/01/16; patient missed her follow-up with Dr. Fletcher Anon last week due to a snow day. Follow-up is now on February 15. The areas on the right lateral malleolus and dorsal right fifth toe remain closed over. The fifth toe was tentative is there is a surface eschar however I'm not going to disturb this. Therefore, her only open areas on the right lateral foot. This is a small punched out area. We have been using Prisma. I'm quite convinced this is an ischemic wound 11/15/16; patient has follow-up with Dr. Fletcher Anon on February 15. She has no open wound on  the left leg and doesn't really complain of claudication that I can determine from talking to her. However on the right leg she clearly has some degree of claudication. The remaining open wound is on the right lateral foot. We have been using Iodosorb ointment Electronic Signature(s) Signed: 11/16/2016 4:28:51 PM By: [name] MD Entered By: [name] on 11/15/2016 13:10:31 [name] ([id number]) -------------------------------------------------------------------------------- Physical Exam Details Patient Name: [name] Date of Service: 11/15/2016 11:00 AM Medical Record Patient Account Number: [id number] Number: Treating RN: Ahmed Prima 03-25-1919 (83 y.o. Other Clinician: Date of Birth/Sex: Female) Treating [name] Primary Care Provider: Lorelee Market Provider/Extender: G Referring Provider: Lorelee Market Weeks in Treatment: 31 Constitutional Sitting or standing Blood Pressure is within target range for patient.. Pulse regular and within target range for patient.Marland Kitchen Respirations  regular, non-labored and within target range.. Temperature is normal and within the target range for the patient.. Patient's appearance is neat and clean. Appears in no acute distress. Well nourished and well developed.. Notes Wound exam; the left lateral ankle remains closed. The left fifth toe has surface eschar but I have not been removing this there is no visible open area. oOn the right lateral foot is her remaining open area. This is a small punched out whole all of the surface of this looks better since we started the Iodosorb ointment. Suspected dimensions are about the same Electronic Signature(s) Signed: 11/16/2016 4:28:51 PM By: [name] MD Entered By: [name] on 11/15/2016 13:11:35 [name] ([id number]) -------------------------------------------------------------------------------- Physician Orders Details Patient Name: [name] Date of Service: 11/15/2016 11:00 AM Medical Record Patient Account Number: [id number] Number: Treating RN: [name] 13-Jun-1919 (83 y.o. Other Clinician: Date of Birth/Sex: Female) Treating [name] Primary Care Provider: Lorelee Market Provider/Extender: G Referring Provider: Vinson Moselle in Treatment: 11 Verbal / Phone Orders: No Diagnosis Coding Wound Cleansing Wound #4 Right,Lateral Foot o Clean wound with Normal Saline. - for clinic use o Cleanse wound with mild soap and water o May Shower, gently pat wound dry prior to applying new dressing. Anesthetic Wound #4 Right,Lateral Foot o Topical Lidocaine 4% cream applied to wound bed prior to debridement - for clinic use Skin Barriers/Peri-Wound Care Wound #4 Right,Lateral Foot o Skin Prep Primary Wound Dressing Wound #4 Right,Lateral Foot o Iodosorb Ointment Secondary Dressing Wound #4 Right,Lateral Foot o Dry Gauze o Boardered Foam Dressing Dressing Change Frequency Wound #4 Right,Lateral Foot o Change  dressing every other day. Follow-up Appointments Wound #4 Right,Lateral Foot o Return Appointment in 2 weeks. Edema Control Wound #4 Right,Lateral Foot [name], [name] ([id number]) o Elevate legs to the level of the heart and pump ankles as often as possible o Other: - compression stockings Additional Orders / Instructions Wound #4 Right,Lateral Foot o Increase protein intake. Medications-please add to medication list. Wound #4 Right,Lateral Foot o Other: - Vitamin C, Zinc, Multivitamins Tramadol Electronic Signature(s) Signed: 11/15/2016 5:09:04 PM By: Gretta Cool RN, BSN, Kim RN, BSN Signed: 11/16/2016 4:28:51 PM By: [name] MD Entered By: Gretta Cool, RN, BSN, Kim on 11/15/2016 11:48:42 [name], [name] ([id number]) -------------------------------------------------------------------------------- Problem List Details Patient Name: [name] Date of Service: 11/15/2016 11:00 AM Medical Record Patient Account Number: [id number] Number: Treating RN: Ahmed Prima 1919-03-19 (83 y.o. 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 Other Clinician: Date of Birth/Sex: Female) Treating [name], [name] Primary Care Provider: Lorelee Market Provider/Extender: G Referring Provider: Vinson Moselle in Treatment: 31 Subjective Chief Complaint Information obtained from Patient here for follow-up on right lateral foot ulcer and left foot ulcers History of Present Illness (HPI) 04/06/16; this is a 83 year old woman who arrives accompanied by 2 daughters for a wound on her left ankle and her left fifth toe. These have apparently been present for a year. I'm not quite certain how she came to this clinic however she was being followed by [name] her podiatrist for these wounds. She was also referred to [facility] and they apparently did a test presumably arterial studies although we don't have any of these results and we couldn't get through to the office today. The family but has been applying a combination of Bactroban and a light bandage and perhaps more recently Silvadene cream. She did have an x-ray of the foot roughly 6 months ago at the podiatry office the family was unaware that if there were any abnormalities. Apparently they have not seen any healing here. Our intake nurse noted a slight skin tear on the right anterior lower leg. Nobody seemed aware of this. ABIs calculated in this clinic was 0.3 on the right and 0.4 on the left I have reviewed things [facility]. There is very little information on this patient. She apparently follows in current total clinic which we don't have information from. She has mentioned already been to a [facility]. She has a history of hypothyroidism,  nephrolithiasis arthritis and has had a previous mastectomy. 04/13/16; patient's x-ray was normal. She is already been to see Dr. Delana Meyer vascular surgery. Her arterial exam was from November 2016 this showed a left ABI of 0.62 her right of 0.76. Her duplex ultrasound of the left leg showed biphasic waves in the common femoral and distal femoral artery however monophasic waves in the superficial femoral artery proximal and mid biphasic it distal. 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 The patient tells me that she has pain at night when she tries to lie down which is improved by getting up and sitting in the chair this sounds like claudication at rest. Her wounds are on the left medial malleolus and the dorsal fifth toe small punched out wounds that are right on bone. We use Santyl last week 04/20/16: nurse informed me pt has declined evaluation for significant PAD. she denies systemic s/s of infections. 04/27/16;; the patient has had noninvasive arterial studies done in November 2016. ABI and the left was 0.62. Monophasic waves at the superficial femoral artery. Occluded to the posterior tibial artery. So had greater than 50% stenosis of the right superficial femoral and greater than 50% stenosis of the left superficial femoral artery. She had bilateral tibial peroneal artery disease. Both of the wounds on the left fifth toe  and left lateral malleolus have been present for more than a year. They have been to see vein and vascular. The patient has some pain but miraculously I think the wounds have largely been stable. No [name], [name] ([id number]) evidence of infection 05/04/16; she goes for a noninvasive study tomorrow and then sees Dr. Fletcher Anon on Monday. By the time she is here next week we should have a better picture of whether something can be done with regards to her arterial flow. We continue to have ischemic-looking wounds on the left fifth toe and left lateral malleolus. 05/10/16; the  patient went for her arterial studies and saw Dr. Fletcher Anon. As predicted she is felt to have critical limb ischemia. The feeling is that she has occlusion of the SFA. The feeling would she would be a candidate for a stent to the SFA. The patient did not make a decision to proceed with the procedure and she is here with family members to discuss this me today. He shouldn't has a lot of pain and cannot sleep and rest well at night per her family. 05/24/16; the patient went and had a complex revascularization/angioplasty of the left superficial femoral artery followed by drug-coated balloon angioplasty and spots stenting. She tolerated the procedure well. She was recommended for dual antiplatelet drugs with Plavix and aspirin for at least a month. She went yesterday for I believe follow-up serial Dopplers and ABIs although I don't see these results. The patient is unfortunately complaining of a lot of pain in her bilateral lower legs below the knees from the ankle to the knees. She apparently was prescribed lidocaine and apparently put this on her legs instead of over the wound areas. This may have something to do with it however there is a lot of edema in her bilateral legs I was able to find her arterial studies from yesterday. The left ABI has improved up to 0.66 post left SFA stent. The bilateral great toe indices remain abnormal with the left being in the 0.25 range. Duplex ultrasound showed her left SFA stent is patent monophasic waveforms persist in the left leg 06/07/16; continued punched-out areas over the dorsal left fifth toe and left lateral malleolus. No major improvement 06/28/16; the areas over her dorsal left fifth toe and left lateral malleolus are covered in surface slough we are using Iodoflex 07/05/16. We have been using Iodoflex for 2-3 weeks now. I have not been debridement is because of pain 07/19/16 currently we have been using Iodoflex for roughly the past month. Previously we were  unable to debride due to pain although today patient states that the pain is not nearly as severe as it has been in the past. In fact she rates this to be a 1 out of 10 and it worse to a to 10 with palpation of the wound. All and all her and her daughter feel like this is actually doing steadily better at this point in time. She is pleased with progress and we have been seeing her every 2 weeks. 08/02/16; this is a delightful 83 year old woman I have not seen in over a month. She has arterial insufficiency wounds remaining over the left lateral malleolus and the left fifth toe. With the help of Dr. Fletcher Anon we are able to get her revascularized on the left. The 2 wounds on the left have been making good progress and are definitely smaller especially the area over the left lateral malleolus. She is certainly in a lot less pain than she used  to be although I still think there is some claudication type pain. Unfortunately she is developed a area on the right lateral foot which is a small open area but I think is threatened. I suspect a small ischemic areas well. Also on her right fifth toe there is an area that is not open but looks as though it is receiving too much pressure from footwear. Finally an area over her right malleolus although I don't think this is on its way to anything ominous 08/17/16 patient presents today for follow up evaluation she tells me that she is really doing fairly well from a pain standpoint compared to where she has been previous. She is tolerating the dressing changes without any complication. She continues to have discharge and drainage however. 08-30-16 Ms. Ferns presents today with her daughter, she states that she continues to have intermittent shooting pains to the left lateral fifth toe at this site of seems to be a healed ulcer. She denies any other issues that her wound related since her last appointment. Her daughter states that she is in need of a tramadol  refill at this time as she uses half a tablet at at bedtime to aid in sleep due to foot pain. Iodoflex has been used on all wounds in previous dressing changes. 09/13/16; the patient has ischemic wounds in her feet. The area over the left lateral malleolus is healed and [name], [name] ([id number]) the area over the left fifth toe looks improved.. She has an area on the lateral aspect of her right foot which is a small but deep wound. Finally she has a new open area on the medial aspect of the left medial malleolus. This is superficial 09/27/16; the area over the left lateral malleolus remains healed. The area over the left fifth toe has a surface and she states the pain is better but I don't think this is completely closed. She has an area on the lateral aspect of her right foot is a small but deep wound. The new open area on the medial aspect of the left ankle was closed from last time. 10/18/16; the area over her left lateral malleolus remains healed. The area over the left fifth toe has a surface over the top of this however there is no overt open area. Given the underlying issues of severe PAD and continued pain in the toe I would think it would be unlikely this is truly healed however I am not planning to debridement this area. The area on the right lateral foot which was a more recent wound is a small punched out painful area again has significant surface slough and nonviable tissue. It is clear the patient still has claudication type pain however she remains functional. I have been giving her tramadol when necessary and that seems to help a lot with her pain the patient follows up with Dr. Fletcher Anon on January 18/18 11/01/16; patient missed her follow-up with Dr. Fletcher Anon last week due to a snow day. Follow-up is now on February 15. The areas on the right lateral malleolus and dorsal right fifth toe remain closed over. The fifth toe was tentative is there is a surface eschar however I'm not going  to disturb this. Therefore, her only open areas on the right lateral foot. This is a small punched out area. We have been using Prisma. I'm quite convinced this is an ischemic wound 11/15/16; patient has follow-up with Dr. Fletcher Anon on February 15. She has no open wound on the left  leg and doesn't really complain of claudication that I can determine from talking to her. However on the right leg she clearly has some degree of claudication. The remaining open wound is on the right lateral foot. We have been using Iodosorb ointment Objective Constitutional Sitting or standing Blood Pressure is within target range for patient.. Pulse regular and within target range for patient.Marland Kitchen Respirations regular, non-labored and within target range.. Temperature is normal and within the target range for the patient.. Patient's appearance is neat and clean. Appears in no acute distress. Well nourished and well developed.. Vitals Time Taken: 11:35 AM, Height: 64 in, Weight: 158 lbs, BMI: 27.1, Temperature: 98.1 F, Pulse: 75 bpm, Respiratory Rate: 16 breaths/min, Blood Pressure: 155/62 mmHg. General Notes: Wound exam; the left lateral ankle remains closed. The left fifth toe has surface eschar but I have not been removing this there is no visible open area. On the right lateral foot is her remaining open area. This is a small punched out whole all of the surface of this looks better since we started the Iodosorb ointment. Suspected dimensions are about the same Integumentary (Hair, Skin) Wound #4 status is Open. Original cause of wound was Gradually Appeared. The wound is located on the [place], [place] ([id number]) Right,Lateral Foot. The wound measures 0.4cm length x 0.6cm width x 0.2cm depth; 0.188cm^2 area and 0.038cm^3 volume. The wound is limited to skin breakdown. There is no tunneling noted. There is a large amount of serous drainage noted. The wound margin is distinct with the outline attached to the wound  base. There is no granulation within the wound bed. There is a large (67-100%) amount of necrotic tissue within the wound bed including Adherent Slough. Periwound temperature was noted as No Abnormality. The periwound has tenderness on palpation. 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 - for clinic use Cleanse wound with mild soap and water May Shower, gently pat wound dry prior to applying new dressing. Anesthetic: Wound #4 Right,Lateral Foot: Topical Lidocaine 4% cream applied to wound bed prior to debridement - for clinic use Skin Barriers/Peri-Wound Care: Wound #4 Right,Lateral Foot: Skin Prep Primary Wound Dressing: Wound #4 Right,Lateral Foot: Iodosorb Ointment Secondary Dressing: Wound #4 Right,Lateral Foot: Dry Gauze Boardered Foam Dressing Dressing Change Frequency: Wound #4 Right,Lateral Foot: Change dressing every other day. Follow-up Appointments: Wound #4 Right,Lateral Foot: [name], [name] ([id number]) Return Appointment in 2 weeks. Edema Control: Wound #4 Right,Lateral Foot: Elevate legs to the level of the heart and pump ankles as often as possible Other: - compression stockings Additional Orders / Instructions: Wound #4 Right,Lateral Foot: Increase protein intake. Medications-please add to medication list.: Wound #4 Right,Lateral Foot: Other: - Vitamin C, Zinc, Multivitamins Tramadol Continue the iodosorb ointment with border foam she is to be reviewed by Dr. Fletcher Anon nextg week. Not sure if he feels another arteriogram is indicated  or not we will review in 2 seeks Electronic Signature(s) Signed: 11/16/2016 4:28:51 PM By: [name] MD Entered By: [name] on 11/15/2016 13:13:40 [name] ([id number]) -------------------------------------------------------------------------------- [place] Details Patient Name: [name] Date of Service: 11/15/2016 Medical Record Patient Account  Number: [id number] Number: Treating RN: Ahmed Prima 07-26-1919 (83 y.o. 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

## 2016-11-17 NOTE — Progress Notes (Signed)
[name] ([id number]) Visit Report for 11/15/2016 Arrival Information Details Patient Name: [name] Date of Service: 11/15/2016 11:00 AM Medical Record Patient Account Number: [id number] Number: Treating RN: Cornell Barman January 02, 1919 (83 y.o. Other Clinician: Date of Birth/Sex: Female) Treating ROBSON, MICHAEL Primary Care Triva Hueber: Lorelee Market Mikylah Ackroyd/Extender: G Referring Muhsin Doris: Vinson Moselle in Treatment: 29 Visit Information History Since Last Visit Added or deleted any medications: No Patient Arrived: [place] Any new allergies or adverse reactions: No Arrival Time: 11:34 Had a fall or experienced change in No Accompanied By: daughter activities of daily living that may affect Transfer Assistance: None risk of falls: Patient Identification Verified: Yes Signs or symptoms of abuse/neglect since last No Secondary Verification Process Yes visito Completed: Hospitalized since last visit: No Patient Requires Transmission- No Has Dressing in Place as Prescribed: Yes Based Precautions: Pain Present Now: No Patient Has Alerts: Yes Patient Alerts: 7/31 ABI: R-0.55 L-0.40 [facility] Electronic Signature(s) Signed: 11/15/2016 5:09:04 PM By: Gretta Cool, RN, BSN, Kim RN, BSN Entered By: Gretta Cool, RN, BSN, Kim on 11/15/2016 11:34:44 [name] ([id number]) -------------------------------------------------------------------------------- Clinic Level of Care Assessment Details Patient Name: [name] Date of Service: 11/15/2016 11:00 AM Medical Record Patient Account Number: [id number] Number: Treating RN: Cornell Barman 05/08/1919 (83 y.o. Other Clinician: Date of Birth/Sex: Female) Treating ROBSON, [place] Primary Care Chellie Vanlue: Lorelee Market Jamison Soward/Extender: G Referring Bud Kaeser: Vinson Moselle in Treatment: 31 Clinic Level of Care Assessment Items TOOL 4 Quantity Score []  - Use when only an  EandM is performed on FOLLOW-UP visit 0 ASSESSMENTS - Nursing Assessment / Reassessment []  - Reassessment of Co-morbidities (includes updates in patient status) 0 X - Reassessment of Adherence to Treatment Plan 1 5 ASSESSMENTS - Wound and Skin Assessment / Reassessment X - Simple Wound Assessment / Reassessment - one wound 1 5 []  - Complex Wound Assessment / Reassessment - multiple wounds 0 []  - Dermatologic / Skin Assessment (not related to wound area) 0 ASSESSMENTS - Focused Assessment []  - Circumferential Edema Measurements - multi extremities 0 []  - Nutritional Assessment / Counseling / Intervention 0 []  - Lower Extremity Assessment (monofilament, tuning fork, pulses) 0 []  - Peripheral Arterial Disease Assessment (using hand held doppler) 0 ASSESSMENTS - Ostomy and/or Continence Assessment and Care []  - Incontinence Assessment and Management 0 []  - Ostomy Care Assessment and Management (repouching, etc.) 0 PROCESS - Coordination of Care X - Simple Patient / Family Education for ongoing care 1 15 []  - Complex (extensive) Patient / Family Education for ongoing care 0 []  - Staff obtains Programmer, systems, Records, Test Results / Process Orders 0 []  - Staff telephones HHA, Nursing Homes / Clarify orders / etc 0 [name], [name] ([id number]) []  - Routine Transfer to another Facility (non-emergent condition) 0 []  - Routine Hospital Admission (non-emergent condition) 0 []  - New Admissions / Biomedical engineer / Ordering NPWT, Apligraf, etc. 0 []  - Emergency Hospital Admission (emergent condition) 0 X - Simple Discharge Coordination 1 10 []  - Complex (extensive) Discharge Coordination 0 PROCESS - Special Needs []  - Pediatric / Minor Patient Management 0 []  - Isolation Patient Management 0 []  - Hearing / Language / Visual special needs 0 []  - Assessment of Community assistance (transportation, D/C planning, etc.) 0 []  - Additional assistance / Altered mentation 0 []  - Support Surface(s)  Assessment (bed, cushion, seat, etc.) 0 INTERVENTIONS - Wound Cleansing / Measurement X - Simple Wound Cleansing - one wound 1 5 []  - Complex Wound Cleansing - multiple wounds 0 X - Wound Imaging (  photographs - any number of wounds) 1 5 []  - Wound Tracing (instead of photographs) 0 X - Simple Wound Measurement - one wound 1 5 []  - Complex Wound Measurement - multiple wounds 0 INTERVENTIONS - Wound Dressings X - Small Wound Dressing one or multiple wounds 1 10 []  - Medium Wound Dressing one or multiple wounds 0 []  - Large Wound Dressing one or multiple wounds 0 []  - Application of Medications - topical 0 []  - Application of Medications - injection 0 [name], [name] ([id number]) INTERVENTIONS - Miscellaneous []  - External ear exam 0 []  - Specimen Collection (cultures, biopsies, blood, body fluids, etc.) 0 []  - Specimen(s) / Culture(s) sent or taken to Lab for analysis 0 []  - Patient Transfer (multiple staff / Harrel Lemon Lift / Similar devices) 0 []  - Simple Staple / Suture removal (25 or less) 0 []  - Complex Staple / Suture removal (26 or more) 0 []  - Hypo / Hyperglycemic Management (close monitor of Blood Glucose) 0 []  - Ankle / Brachial Index (ABI) - do not check if billed separately 0 X - Vital Signs 1 5 Has the patient been seen at the hospital within the last three years: Yes Total Score: 65 Level Of Care: New/Established - Level 2 Electronic Signature(s) Signed: 11/16/2016 5:04:45 PM By: Gretta Cool, RN, BSN, Kim RN, BSN Entered By: Gretta Cool, RN, BSN, Kim on 11/16/2016 10:29:14 [name] ([id number]) -------------------------------------------------------------------------------- Encounter Discharge Information Details Patient Name: [name] Date of Service: 11/15/2016 11:00 AM Medical Record Patient Account Number: [id number] Number: Treating RN: Carolyne Fiscal, Debi 06/27/1919 (83 y.o. Other Clinician: Date of Birth/Sex: Female) Treating ROBSON, MICHAEL Primary Care  Loreli Debruler: Lorelee Market Deshayla Empson/Extender: G Referring Khamani Daniely: Vinson Moselle in Treatment: 31 Encounter Discharge Information Items Schedule Follow-up Appointment: No Medication Reconciliation completed No and provided to Patient/Care Kendarrius Tanzi: Provided on Clinical Summary of Care: 11/15/2016 Form Type Recipient Paper Patient [facility] Electronic Signature(s) Signed: 11/15/2016 11:53:56 AM By: Ruthine Dose Entered By: Ruthine Dose on 11/15/2016 11:53:56 [name] ([id number]) -------------------------------------------------------------------------------- Lower Extremity Assessment Details Patient Name: [name] Date of Service: 11/15/2016 11:00 AM Medical Record Patient Account Number: [id number] Number: Treating RN: Cornell Barman 1918-11-08 (83 y.o. Other Clinician: Date of Birth/Sex: Female) Treating ROBSON, [place] Primary Care Arlynn Stare: Lorelee Market Delesha Pohlman/Extender: G Referring Nekesha Font: Vinson Moselle in Treatment: 31 Vascular Assessment Pulses: Dorsalis Pedis Palpable: [Right:Yes] Posterior Tibial Palpable: [Right:Yes] Extremity colors, hair growth, and conditions: Extremity Color: [Right:Pale] Hair Growth on Extremity: [Right:No] Temperature of Extremity: [Right:Warm] Capillary Refill: [Right:< 3 seconds] Dependent Rubor: [Right:No] Blanched when Elevated: [Right:No] Lipodermatosclerosis: [Right:No] Toe Nail Assessment Left: Right: Thick: No Discolored: Yes Deformed: No Improper Length and Hygiene: No Electronic Signature(s) Signed: 11/15/2016 5:09:04 PM By: Gretta Cool, RN, BSN, Kim RN, BSN Entered By: Gretta Cool, RN, BSN, Kim on 11/15/2016 11:40:48 [name] ([id number]) -------------------------------------------------------------------------------- Multi Wound Chart Details Patient Name: [name] Date of Service: 11/15/2016 11:00 AM Medical Record Patient Account Number:  [id number] Number: Treating RN: Cornell Barman 1919-05-04 (83 y.o. Other Clinician: Date of Birth/Sex: Female) Treating ROBSON, MICHAEL Primary Care Keylin Podolsky: Lorelee Market Aniyla Harling/Extender: G Referring Nicholos Aloisi: Lorelee Market Weeks in Treatment: 31 Vital Signs Height(in): 64 Pulse(bpm): 75 Weight(lbs): 158 Blood Pressure 155/62 (mmHg): Body Mass Index(BMI): 27 Temperature(F): 98.1 Respiratory Rate 16 (breaths/min): Photos: [N/A:N/A] Wound Location: Right Foot - Lateral N/A N/A Wounding Event: Gradually Appeared N/A N/A Primary Etiology: Arterial Insufficiency Ulcer N/A N/A Comorbid History: Cataracts, Hypertension, N/A N/A Osteoarthritis Date Acquired: 07/26/2016 N/A N/A Weeks of Treatment: 15 N/A N/A Wound  Status: Open N/A N/A Measurements L x W x D 0.4x0.6x0.2 N/A N/A (cm) Area (cm) : 0.188 N/A N/A Volume (cm) : 0.038 N/A N/A % Reduction in Area: -300.00% N/A N/A % Reduction in Volume: -660.00% N/A N/A Classification: Partial Thickness N/A N/A Exudate Amount: Large N/A N/A Exudate Type: Serous N/A N/A Exudate Color: amber N/A N/A Wound Margin: Distinct, outline attached N/A N/A Granulation Amount: None Present (0%) N/A N/A Necrotic Amount: Large (67-100%) N/A N/A Exposed Structures: N/A N/A [name], [name] ([id number]) Fascia: No Fat Layer (Subcutaneous Tissue) Exposed: No Tendon: No Muscle: No Joint: No Bone: No Limited to Skin Breakdown Epithelialization: None N/A N/A Periwound Skin Texture: No Abnormalities Noted N/A N/A Periwound Skin No Abnormalities Noted N/A N/A Moisture: Periwound Skin Color: No Abnormalities Noted N/A N/A Temperature: No Abnormality N/A N/A Tenderness on Yes N/A N/A Palpation: Wound Preparation: Ulcer Cleansing: N/A N/A Rinsed/Irrigated with Saline Topical Anesthetic Applied: Other: lidocaine 4% Treatment Notes Electronic Signature(s) Signed: 11/16/2016 4:28:51 PM By: [name] MD Entered  By: [name] on 11/15/2016 13:09:00 [name] ([id number]) -------------------------------------------------------------------------------- Multi-Disciplinary Care Plan Details Patient Name: [name] Date of Service: 11/15/2016 11:00 AM Medical Record Patient Account Number: [id number] Number: Treating RN: Cornell Barman [birth year] (83 y.o. Other Clinician: Date of Birth/Sex: Female) Treating ROBSON, MICHAEL Primary Care Christena Sunderlin: Lorelee Market Nedra Mcinnis/Extender: G Referring Caileigh Canche: Vinson Moselle in Treatment: 31 Active Inactive ` Abuse / Safety / Falls / Self Care Management Nursing Diagnoses: Potential for falls Goals: Patient will remain injury free Date Initiated: 04/06/2016 Target Resolution Date: 12/22/2016 Goal Status: Active Interventions: Assess fall risk on admission and as needed Notes: ` Nutrition Nursing Diagnoses: Imbalanced nutrition Goals: Patient/caregiver agrees to and verbalizes understanding of need to use nutritional supplements and/or vitamins as prescribed Date Initiated: 04/06/2016 Target Resolution Date: 12/22/2016 Goal Status: Active Interventions: Assess patient nutrition upon admission and as needed per policy Notes: ` Orientation to the Wound Care Program [place] ([id number]) Nursing Diagnoses: Knowledge deficit related to the wound healing center program Goals: Patient/caregiver will verbalize understanding of the [facility] Date Initiated: 04/06/2016 Target Resolution Date: 12/22/2016 Goal Status: Active Interventions: Provide education on orientation to the wound center Notes: ` Pain, Acute or Chronic Nursing Diagnoses: Pain, acute or chronic: actual or potential Potential alteration in comfort, pain Goals: Patient will verbalize adequate pain control and receive pain control interventions during procedures as needed Date Initiated: 04/06/2016 Target Resolution  Date: 12/22/2016 Goal Status: Active Interventions: Assess comfort goal upon admission Complete pain assessment as per visit requirements Notes: ` Wound/Skin Impairment Nursing Diagnoses: Impaired tissue integrity Goals: Ulcer/skin breakdown will have a volume reduction of 30% by week 4 Date Initiated: 04/06/2016 Target Resolution Date: 12/22/2016 Goal Status: Active Ulcer/skin breakdown will have a volume reduction of 50% by week 8 Date Initiated: 04/06/2016 Target Resolution Date: 12/22/2016 Goal Status: Active Ulcer/skin breakdown will have a volume reduction of 80% by week 12 [name], [name] ([id number]) Date Initiated: 04/06/2016 Target Resolution Date: 12/22/2016 Goal Status: Active Interventions: Assess patient/caregiver ability to obtain necessary supplies Assess ulceration(s) every visit Notes: Electronic Signature(s) Signed: 11/15/2016 5:09:04 PM By: Gretta Cool, RN, BSN, Kim RN, BSN Entered By: Gretta Cool, RN, BSN, Kim on 11/15/2016 11:43:24 [name] ([id number]) -------------------------------------------------------------------------------- Pain Assessment Details Patient Name: [name] Date of Service: 11/15/2016 11:00 AM Medical Record Patient Account Number: [id number] Number: Treating RN: Cornell Barman 08-Oct-1919 (83 y.o. Other Clinician: Date of Birth/Sex: Female) Treating ROBSON, MICHAEL Primary Care Gaige Fussner: Lorelee Market Kyleeann Cremeans/Extender: G Referring  Aneta Hendershott: Vinson Moselle in Treatment: 31 Active Problems Location of Pain Severity and Description of Pain Patient Has Paino No Site Locations With Dressing Change: No Pain Management and Medication Current Pain Management: Electronic Signature(s) Signed: 11/15/2016 5:09:04 PM By: Gretta Cool, RN, BSN, Kim RN, BSN Entered By: Gretta Cool, RN, BSN, Kim on 11/15/2016 11:35:24 [name] ([id number]) -------------------------------------------------------------------------------- Wound  Assessment Details Patient Name: [name] Date of Service: 11/15/2016 11:00 AM Medical Record Patient Account Number: [id number] Number: Treating RN: Cornell Barman [birth year] (83 y.o. Other Clinician: Date of Birth/Sex: Female) Treating ROBSON, [place] Primary Care Josedaniel Haye: Lorelee Market Fany Cavanaugh/Extender: G Referring Keondria Siever: Lorelee Market Weeks in Treatment: 31 Wound Status Wound Number: 4 Primary Arterial Insufficiency Ulcer Etiology: Wound Location: Right Foot - Lateral Wound Status: Open Wounding Event: Gradually Appeared Comorbid Cataracts, Hypertension, Date Acquired: 07/26/2016 History: Osteoarthritis Weeks Of Treatment: 15 Clustered Wound: No Photos Wound Measurements Length: (cm) 0.4 Width: (cm) 0.6 Depth: (cm) 0.2 Area: (cm) 0.188 Volume: (cm) 0.038 % Reduction in Area: -300% % Reduction in Volume: -660% Epithelialization: None Tunneling: No Wound Description Classification: Partial Thickness Foul Odor Afte Wound Margin: Distinct, outline attached Exudate Amount: Large Exudate Type: Serous Exudate Color: amber r Cleansing: No Wound Bed Granulation Amount: None Present (0%) Exposed Structure Necrotic Amount: Large (67-100%) Fascia Exposed: No Necrotic Quality: Adherent Slough Fat Layer (Subcutaneous Tissue) Exposed: No Tendon Exposed: No Muscle Exposed: No Joint Exposed: No [name], [name] ([id number]) Bone Exposed: No Limited to Skin Breakdown Periwound Skin Texture Texture Color No Abnormalities Noted: No No Abnormalities Noted: No Moisture Temperature / Pain No Abnormalities Noted: No Temperature: No Abnormality Tenderness on Palpation: Yes Wound Preparation Ulcer Cleansing: Rinsed/Irrigated with Saline Topical Anesthetic Applied: Other: lidocaine 4%, Electronic Signature(s) Signed: 11/15/2016 5:09:04 PM By: Gretta Cool, RN, BSN, Kim RN, BSN Entered By: Gretta Cool, RN, BSN, Kim on 11/15/2016 11:40:05 [name]  ([id number]) -------------------------------------------------------------------------------- Vitals Details Patient Name: [name] Date of Service: 11/15/2016 11:00 AM Medical Record Patient Account Number: [id number] Number: Treating RN: Cornell Barman 1919/01/20 (83 y.o. Other Clinician: Date of Birth/Sex: Female) Treating ROBSON, MICHAEL Primary Care Fordyce Lepak: Lorelee Market Seanna Sisler/Extender: G Referring Amir Glaus: Lorelee Market Weeks in Treatment: 31 Vital Signs Time Taken: 11:35 Temperature (F): 98.1 Height (in): 64 Pulse (bpm): 75 Weight (lbs): 158 Respiratory Rate (breaths/min): 16 Body Mass Index (BMI): 27.1 Blood Pressure (mmHg): 155/62 Reference Range: 80 - 120 mg / dl Electronic Signature(s) Signed: 11/15/2016 5:09:04 PM By: Gretta Cool, RN, BSN, Kim RN, BSN Entered By: Gretta Cool, RN, BSN, Kim on 11/15/2016 11:35:42

## 2016-11-24 ENCOUNTER — Encounter: Payer: Self-pay | Admitting: Cardiovascular Disease

## 2016-11-24 ENCOUNTER — Ambulatory Visit (INDEPENDENT_AMBULATORY_CARE_PROVIDER_SITE_OTHER): Payer: Medicare PPO | Admitting: Cardiovascular Disease

## 2016-11-24 VITALS — BP 150/52 | HR 73 | Ht 63.0 in | Wt 147.0 lb

## 2016-11-24 DIAGNOSIS — I739 Peripheral vascular disease, unspecified: Secondary | ICD-10-CM

## 2016-11-24 DIAGNOSIS — I10 Essential (primary) hypertension: Secondary | ICD-10-CM | POA: Diagnosis not present

## 2016-11-24 NOTE — Progress Notes (Signed)
Cardiology Office Note   Date:  11/24/2016   ID:  [name], DOB Jan 15, 1919, MRN [id number]  PCP:  Lorelee Market, MD  Cardiologist:   [name], MD   Chief Complaint  Patient presents with  . other    3 month follow up Patient states she is "doing well" Meds reviewed verbally with patient.       History of Present Illness: [name] is a 83 y.o. female who Is here today for a follow-up visit regarding peripheral arterial disease and nonhealing ulcer on the left foot. The patient has no previous cardiac history and no history of diabetes or tobacco use. She is known to have hypertension, hyperlipidemia and hypothyroidism.   She was seen last year for nonhealing wounds on the left foot.  Noninvasive vascular evaluation showed an ABI of 0.55 on the right and 0.40 on the left. Duplex showed relatively long occlusion of the mid to distal left SFA with two-vessel runoff below the knee. Angiography in August showed no significant aortoiliac disease. There was long occlusion of the left SFA with 1 vessel runoff below the knee via the peroneal artery which was occluded but gave collaterals to the dorsalis pedis. I performed successful angioplasty of the left SFA followed by drug-coated balloon angioplasty and spot stenting with self-expanding stent in the midsegment. Postprocedure ABI showed improvement on the left side to 0.66.  The ulcers on the left foot has healed completely. There is a small scab on the small toe but there is no open ulceration. She denies any significant pain. She did develop a small ulceration on the lateral side of the right foot but that has been improving.    Past Medical History:  Diagnosis Date  . Arthritis    "legs" 05/18/2016)  . Breast cancer, right breast ([place])    mastectomy 30 yr ago  . DVT (deep venous thrombosis) ([place])    "found an old clot in her ?left leg when they were doing vascular studies; put her on ASA" (05/18/2016)  .  Family history of adverse reaction to anesthesia    "all the family get PONV" (05/18/2016)  . Hypertension   . Hypothyroidism   . kidney calculi   . PAD (peripheral artery disease) ([place])   . PONV (postoperative nausea and vomiting)   . Shortness of breath     Past Surgical History:  Procedure Laterality Date  . BALLOON ANGIOPLASTY, ARTERY Left 05/18/2016   SFA/notes 05/18/2016  . BREAST BIOPSY Right   . CATARACT EXTRACTION W/ INTRAOCULAR LENS  IMPLANT, BILATERAL Bilateral 2014  . CYSTOSCOPY W/ LITHOLAPAXY / [facility]    . CYSTOSCOPY W/ URETERAL STENT PLACEMENT  01/17/2012   Procedure: CYSTOSCOPY WITH RETROGRADE PYELOGRAM/URETERAL STENT PLACEMENT;  Surgeon: Franchot Gallo, MD;  Location: [facility];  Service: Urology;  Laterality: Left;  . CYSTOSCOPY W/ URETERAL STENT PLACEMENT  11/10/2010   Archie Endo 11/10/2010  . CYSTOSCOPY W/ URETERAL STENT REMOVAL  11/22/2010   Archie Endo 11/22/2010  . EYE SURGERY    . FRACTURE SURGERY    . MASTECTOMY COMPLETE / SIMPLE Right   . PERIPHERAL VASCULAR CATHETERIZATION N/A 05/18/2016   Procedure: Abdominal Aortogram w/Lower Extremity;  Surgeon: Wellington Hampshire, MD;  Location: [facility];  Service: Cardiovascular;  Laterality: N/A;  . WRIST FRACTURE SURGERY Left      Current Outpatient Prescriptions  Medication Sig Dispense Refill  . amLODipine (NORVASC) 5 MG tablet Take 5 mg by mouth daily with breakfast.     .  cetirizine (ZYRTEC) 10 MG tablet Take 10 mg by mouth daily.    . clopidogrel (PLAVIX) 75 MG tablet Take 1 tablet (75 mg total) by mouth daily with breakfast. 30 tablet 5  . levothyroxine (SYNTHROID, LEVOTHROID) 50 MCG tablet Take 50 mcg by mouth daily with breakfast.     . lidocaine-prilocaine (EMLA) cream Apply 1 application topically as needed. Dressing change    . losartan (COZAAR) 50 MG tablet Take 50 mg by mouth daily with breakfast.     . nortriptyline (PAMELOR) 25 MG capsule Take 1 capsule (25 mg total) by mouth at bedtime. 30 capsule 1  .  potassium chloride SA (K-DUR,KLOR-CON) 20 MEQ tablet Take 1 tablet (20 mEq total) by mouth daily. For 5 days 5 tablet 0  . furosemide (LASIX) 20 MG tablet Take 1 tablet (20 mg total) by mouth daily. For 5 days 5 tablet 0   No current facility-administered medications for this visit.     Allergies:   Codeine    Social History:  The patient  reports that she has never smoked. She has never used smokeless tobacco. She reports that she does not drink alcohol or use drugs.   Family History:  The patient's Family history is remarkable for coronary artery disease.   ROS:  Please see the history of present illness.   Otherwise, review of systems are positive for none.   All other systems are reviewed and negative.    PHYSICAL EXAM: VS:  BP (!) 150/52 (BP Location: Left Arm, Patient Position: Sitting, Cuff Size: Normal)   Pulse 73   Ht 5\' 3"  (1.6 m)   Wt 147 lb (66.7 kg)   BMI 26.04 kg/m  , BMI Body mass index is 26.04 kg/m. GEN: Well nourished, well developed, in no acute distress  HEENT: normal  Neck: no JVD, carotid bruits, or masses Cardiac: RRR; no murmurs, rubs, or gallops,no edema  Respiratory:  clear to auscultation bilaterally, normal work of breathing GI: soft, nontender, nondistended, + BS MS: no deformity or atrophy  Skin: warm and dry, no rash Neuro:  Strength and sensation are intact Psych: euthymic mood, full affect Vascular: Femoral pulses are normal bilaterally. Distal pulses are not palpable.  EKG:  EKG is not ordered today.   Recent Labs: 05/16/2016: BUN 17; Potassium 3.5; Sodium 142 05/18/2016: Creatinine, Ser 0.63; Hemoglobin 12.6; Platelets 273    Lipid Panel No results found for: CHOL, TRIG, HDL, CHOLHDL, VLDL, LDLCALC, LDLDIRECT    Wt Readings from Last 3 Encounters:  11/24/16 147 lb (66.7 kg)  07/14/16 148 lb (67.1 kg)  06/14/16 148 lb 8 oz (67.4 kg)      Other studies Reviewed: Additional studies/ records that were reviewed today include: Office  note from the wound center.    ASSESSMENT AND PLAN:  1.  Peripheral arterial disease: Previous critical limb ischemia affecting the left foot. Status post successful angioplasty to the left SFA with stent placement.  The ulcers on the left foot has healed completely. She now has a small ulceration on the right lateral foot but that seems to be improving with wound care. She is known to have significant right SFA disease. I'm going to repeat her lower extremity arterial Doppler and bilateral duplex. If the wound on the right does not heal, she might require endovascular intervention on the right SFA. For now, continue treatment with Plavix and stop aspirin.  2. Essential hypertension: Blood pressure is  elevated. Continue to monitor.   Disposition:  FU with me in 2 months  Signed,  [name], MD  11/24/2016 11:21 AM    [place]

## 2016-11-24 NOTE — Patient Instructions (Signed)
Medication Instructions:  Your physician has recommended you make the following change in your medication:  STOP taking aspirin 81mg     Labwork: none  Testing/Procedures: Your physician has requested that you have a lower extremity arterial duplex. This test is an ultrasound of the arteries in the legs. It looks at arterial blood flow in the legs. Allow one hour for Lower  Arterial scans. There are no restrictions or special instructions   Follow-Up: Your physician recommends that you schedule a follow-up appointment in: 2 months with Dr. Fletcher Anon.    Any Other Special Instructions Will Be Listed Below (If Applicable).     If you need a refill on your cardiac medications before your next appointment, please call your pharmacy.

## 2016-11-29 ENCOUNTER — Encounter: Payer: Medicare PPO | Admitting: Internal Medicine

## 2016-11-29 DIAGNOSIS — I70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: Secondary | ICD-10-CM | POA: Diagnosis not present

## 2016-11-30 NOTE — Progress Notes (Signed)
SARI, SHALES ([id number]) Visit Report for 11/29/2016 Chief Complaint Document Details Patient Name: [name], [name] Date of Service: 11/29/2016 11:00 AM Medical Record Patient Account Number: [id number] Number: Treating RN: Ahmed Prima 03/22/1919 (83 y.o. Other Clinician: Date of Birth/Sex: Female) Treating ROBSON, MICHAEL Primary Care Provider: Lorelee Market Provider/Extender: G Referring Provider: Vinson Moselle in Treatment: 33 Information Obtained from: Patient Chief Complaint here for follow-up on right lateral foot ulcer and left foot ulcers Electronic Signature(s) Signed: 11/30/2016 9:58:44 AM By: [name] MD Entered By: [name] on 11/29/2016 11:34:26 [name] ([id number]) -------------------------------------------------------------------------------- Debridement Details Patient Name: [name] Date of Service: 11/29/2016 11:00 AM Medical Record Patient Account Number: [id number] Number: Treating RN: Carolyne Fiscal, Debi [birth year] (83 y.o. Other Clinician: Date of Birth/Sex: Female) Treating ROBSON, Mosquero Primary Care Provider: Lorelee Market Provider/Extender: G Referring Provider: Vinson Moselle in Treatment: 33 Debridement Performed for Wound #4 Right,Lateral Foot Assessment: Performed By: Physician Ricard Dillon, MD Debridement: Debridement Pre-procedure Yes - 11:27 Verification/Time Out Taken: Start Time: 11:28 Pain Control: Lidocaine 4% Topical Solution Level: Skin/Subcutaneous Tissue Total Area Debrided (L x 0.3 (cm) x 0.5 (cm) = 0.15 (cm) W): Tissue and other Viable, Non-Viable, Exudate, Fibrin/Slough, Subcutaneous material debrided: Instrument: Curette Bleeding: Minimum Hemostasis Achieved: Pressure End Time: 11:29 Procedural Pain: 0 Post Procedural Pain: 0 Response to Treatment: Procedure was tolerated well Post Debridement Measurements of Total  Wound Length: (cm) 0.3 Width: (cm) 0.5 Depth: (cm) 0.2 Volume: (cm) 0.024 Character of Wound/Ulcer Post Requires Further Debridement Debridement: Severity of Tissue Post Debridement: Fat layer exposed Post Procedure Diagnosis Same as Pre-procedure Electronic Signature(s) Signed: 11/29/2016 4:45:40 PM By: Alric Quan Signed: 11/30/2016 9:58:44 AM By: [name] MD [place], Estill Bamberg ([id number]) Entered By: [name] on 11/29/2016 11:33:39 [name] ([id number]) -------------------------------------------------------------------------------- HPI Details Patient Name: [name] Date of Service: 11/29/2016 11:00 AM Medical Record Patient Account Number: [id number] Number: Treating RN: Carolyne Fiscal, Debi [birth year] (83 y.o. Other Clinician: Date of Birth/Sex: Female) Treating ROBSON, MICHAEL Primary Care Provider: Lorelee Market Provider/Extender: G Referring Provider: Vinson Moselle in Treatment: 33 History of Present Illness HPI Description: 04/06/16; this is a 83 year old woman who arrives accompanied by 2 daughters for a wound on her left ankle and her left fifth toe. These have apparently been present for a year. I'm not quite certain how she came to this clinic however she was being followed by Sharlotte Alamo her podiatrist for these wounds. She was also referred to [facility] and they apparently did a test presumably arterial studies although we don't have any of these results and we couldn't get through to the office today. The family but has been applying a combination of Bactroban and a light bandage and perhaps more recently Silvadene cream. She did have an x-ray of the foot roughly 6 months ago at the podiatry office the family was unaware that if there were any abnormalities. Apparently they have not seen any healing here. Our intake nurse noted a slight skin tear on the right anterior lower leg. Nobody seemed  aware of this. ABIs calculated in this clinic was 0.3 on the right and 0.4 on the left I have reviewed things [facility]. There is very little information on this patient. She apparently follows in current total clinic which we don't have information from. She has mentioned already been to a [facility]. She has a history of hypothyroidism, nephrolithiasis arthritis and has had a previous mastectomy. 04/13/16; patient's x-ray was normal. She is already  been to see Dr. Delana Meyer vascular surgery. Her arterial exam was from November 2016 this showed a left ABI of 0.62 her right of 0.76. Her duplex ultrasound of the left leg showed biphasic waves in the common femoral and distal femoral artery however monophasic waves in the superficial femoral artery proximal and mid biphasic it distal. 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 The patient tells me that she has pain at night when she tries to lie down which is improved by getting up and sitting in the chair this sounds like claudication at rest. Her wounds are on the left medial malleolus and the dorsal fifth toe small punched out wounds that are right on bone. We use Santyl last week 04/20/16: nurse informed me pt has declined evaluation for significant PAD. she denies systemic s/s of infections. 04/27/16;; the patient has had noninvasive arterial studies done in November 2016. ABI and the left was 0.62. Monophasic waves at the superficial femoral artery. Occluded to the posterior tibial artery. So had greater than 50% stenosis of the right superficial femoral and greater than 50% stenosis of the left superficial femoral artery. She had bilateral tibial peroneal artery disease. Both of the wounds on the left fifth toe and left lateral malleolus have been present for more than a year. They have been to see vein and vascular. The patient has some pain but miraculously I think the wounds have largely been stable. No evidence of infection 05/04/16; she  goes for a noninvasive study tomorrow and then sees Dr. Fletcher Anon on Monday. By the time she is here next week we should have a better picture of whether something can be done with regards to her arterial flow. We continue to have ischemic-looking wounds on the left fifth toe and left lateral malleolus. 05/10/16; the patient went for her arterial studies and saw Dr. Fletcher Anon. As predicted she is felt to have critical limb ischemia. The feeling is that she has occlusion of the SFA. The feeling would she would be a Morikawa, Adaora ([id number]) candidate for a stent to the SFA. The patient did not make a decision to proceed with the procedure and she is here with family members to discuss this me today. He shouldn't has a lot of pain and cannot sleep and rest well at night per her family. 05/24/16; the patient went and had a complex revascularization/angioplasty of the left superficial femoral artery followed by drug-coated balloon angioplasty and spots stenting. She tolerated the procedure well. She was recommended for dual antiplatelet drugs with Plavix and aspirin for at least a month. She went yesterday for I believe follow-up serial Dopplers and ABIs although I don't see these results. The patient is unfortunately complaining of a lot of pain in her bilateral lower legs below the knees from the ankle to the knees. She apparently was prescribed lidocaine and apparently put this on her legs instead of over the wound areas. This may have something to do with it however there is a lot of edema in her bilateral legs I was able to find her arterial studies from yesterday. The left ABI has improved up to 0.66 post left SFA stent. The bilateral great toe indices remain abnormal with the left being in the 0.25 range. Duplex ultrasound showed her left SFA stent is patent monophasic waveforms persist in the left leg 06/07/16; continued punched-out areas over the dorsal left fifth toe and left lateral malleolus. No  major improvement 06/28/16; the areas over her dorsal left fifth toe and left  lateral malleolus are covered in surface slough we are using Iodoflex 07/05/16. We have been using Iodoflex for 2-3 weeks now. I have not been debridement is because of pain 07/19/16 currently we have been using Iodoflex for roughly the past month. Previously we were unable to debride due to pain although today patient states that the pain is not nearly as severe as it has been in the past. In fact she rates this to be a 1 out of 10 and it worse to a to 10 with palpation of the wound. All and all her and her daughter feel like this is actually doing steadily better at this point in time. She is pleased with progress and we have been seeing her every 2 weeks. 08/02/16; this is a delightful 83 year old woman I have not seen in over a month. She has arterial insufficiency wounds remaining over the left lateral malleolus and the left fifth toe. With the help of Dr. Fletcher Anon we are able to get her revascularized on the left. The 2 wounds on the left have been making good progress and are definitely smaller especially the area over the left lateral malleolus. She is certainly in a lot less pain than she used to be although I still think there is some claudication type pain. Unfortunately she is developed a area on the right lateral foot which is a small open area but I think is threatened. I suspect a small ischemic areas well. Also on her right fifth toe there is an area that is not open but looks as though it is receiving too much pressure from footwear. Finally an area over her right malleolus although I don't think this is on its way to anything ominous 08/17/16 patient presents today for follow up evaluation she tells me that she is really doing fairly well from a pain standpoint compared to where she has been previous. She is tolerating the dressing changes without any complication. She continues to have discharge and  drainage however. 08-30-16 Ms. Herbig presents today with her daughter, she states that she continues to have intermittent shooting pains to the left lateral fifth toe at this site of seems to be a healed ulcer. She denies any other issues that her wound related since her last appointment. Her daughter states that she is in need of a tramadol refill at this time as she uses half a tablet at at bedtime to aid in sleep due to foot pain. Iodoflex has been used on all wounds in previous dressing changes. 09/13/16; the patient has ischemic wounds in her feet. The area over the left lateral malleolus is healed and the area over the left fifth toe looks improved.. She has an area on the lateral aspect of her right foot which is a small but deep wound. Finally she has a new open area on the medial aspect of the left medial malleolus. This is superficial 09/27/16; the area over the left lateral malleolus remains healed. The area over the left fifth toe has a surface and she states the pain is better but I don't think this is completely closed. She has an area on the lateral aspect of her right foot is a small but deep wound. The new open area on the medial aspect of the [facility], [facility] ([id number]) left ankle was closed from last time. 10/18/16; the area over her left lateral malleolus remains healed. The area over the left fifth toe has a surface over the top of this however there is no  overt open area. Given the underlying issues of severe PAD and continued pain in the toe I would think it would be unlikely this is truly healed however I am not planning to debridement this area. The area on the right lateral foot which was a more recent wound is a small punched out painful area again has significant surface slough and nonviable tissue. It is clear the patient still has claudication type pain however she remains functional. I have been giving her tramadol when necessary and that seems to help a lot with  her pain the patient follows up with Dr. Fletcher Anon on January 18/18 11/01/16; patient missed her follow-up with Dr. Fletcher Anon last week due to a snow day. Follow-up is now on February 15. The areas on the right lateral malleolus and dorsal right fifth toe remain closed over. The fifth toe was tentative is there is a surface eschar however I'm not going to disturb this. Therefore, her only open areas on the right lateral foot. This is a small punched out area. We have been using Prisma. I'm quite convinced this is an ischemic wound 11/15/16; patient has follow-up with Dr. Fletcher Anon on February 15. She has no open wound on the left leg and doesn't really complain of claudication that I can determine from talking to her. However on the right leg she clearly has some degree of claudication. The remaining open wound is on the right lateral foot. We have been using Iodosorb ointment to/20/18; patient saw Dr. Fletcher Anon on 11/24/16. His comment is that her wound on her right lateral foot seems to be improving with local wound care. She has known significant right SFA disease. Her lower extremity Doppler will be repeated and according to the patient's daughter that appointment is on March 15. He is left with the thought that endovascular intervention in the right SFA might be necessary. The patient has quite a bit of pain especially at night related to the wound in her right foot. We changed her to Iodosorb ointment last week Electronic Signature(s) Signed: 11/30/2016 9:58:44 AM By: [name] MD Entered By: [name] on 11/29/2016 11:35:49 [name] ([id number]) -------------------------------------------------------------------------------- Physical Exam Details Patient Name: [name] Date of Service: 11/29/2016 11:00 AM Medical Record Patient Account Number: [id number] Number: Treating RN: Ahmed Prima [birth year] (83 y.o. Other Clinician: Date of Birth/Sex: Female) Treating  ROBSON, MICHAEL Primary Care Provider: Lorelee Market Provider/Extender: G Referring Provider: Lorelee Market Weeks in Treatment: 33 Constitutional Sitting or standing Blood Pressure is within target range for patient.. Pulse regular and within target range for patient.Marland Kitchen Respirations regular, non-labored and within target range.. Temperature is normal and within the target range for the patient.. Patient's appearance is neat and clean. Appears in no acute distress. Well nourished and well developed.Marland Kitchen Respiratory Respiratory effort is easy and symmetric bilaterally. Rate is normal at rest and on room air.. Cardiovascular Pedal pulses absent bilaterally.. Notes Wound exam; she has a small punched out area on the right lateral foot. The wound measures smaller and overall circumference and perhaps slightly less deep. Still using a #3 curette I removed necrotic surface material. Post debridement the wound looks fairly stable. No hemostasis was necessary. Electronic Signature(s) Signed: 11/30/2016 9:58:44 AM By: [name] MD Entered By: [name] on 11/29/2016 11:37:06 [name] ([id number]) -------------------------------------------------------------------------------- Physician Orders Details Patient Name: [name] Date of Service: 11/29/2016 11:00 AM Medical Record Patient Account Number: [id number] Number: Treating RN: Ahmed Prima 08-18-1919 (83 y.o. Other Clinician: Date of Birth/Sex: Female)  Treating [name] Primary Care Provider: Lorelee Market Provider/Extender: G Referring Provider: Vinson Moselle in Treatment: 70 Verbal / Phone Orders: Yes Clinician: Carolyne Fiscal, Debi Read Back and Verified: Yes Diagnosis Coding Wound Cleansing Wound #4 Right,Lateral Foot o Clean wound with Normal Saline. - for clinic use o Cleanse wound with mild soap and water o May Shower, gently pat wound dry prior to applying  new dressing. Anesthetic Wound #4 Right,Lateral Foot o Topical Lidocaine 4% cream applied to wound bed prior to debridement - for clinic use Skin Barriers/Peri-Wound Care Wound #4 Right,Lateral Foot o Skin Prep Primary Wound Dressing Wound #4 Right,Lateral Foot o Iodosorb Ointment Secondary Dressing Wound #4 Right,Lateral Foot o Dry Gauze o Boardered Foam Dressing Dressing Change Frequency Wound #4 Right,Lateral Foot o Change dressing every other day. Follow-up Appointments Wound #4 Right,Lateral Foot o Return Appointment in 2 weeks. Edema Control Wound #4 Right,Lateral Foot Cossey, Abriella ([id number]) o Elevate legs to the level of the heart and pump ankles as often as possible o Other: - compression stockings Additional Orders / Instructions Wound #4 Right,Lateral Foot o Increase protein intake. Medications-please add to medication list. Wound #4 Right,Lateral Foot o Other: - Vitamin C, Zinc, Multivitamins Tramadol Electronic Signature(s) Signed: 11/29/2016 4:45:40 PM By: Alric Quan Signed: 11/30/2016 9:58:44 AM By: [name] MD Entered By: Alric Quan on 11/29/2016 11:29:10 [name] ([id number]) -------------------------------------------------------------------------------- Problem List Details Patient Name: [name] Date of Service: 11/29/2016 11:00 AM Medical Record Patient Account Number: [id number] Number: Treating RN: Ahmed Prima June 04, 1919 (83 y.o. 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 Other Clinician: Date of Birth/Sex: Female) Treating ROBSON, [place] Primary Care Provider: Lorelee Market Provider/Extender: G Referring Provider: Vinson Moselle in Treatment: 33 Subjective Chief Complaint Information obtained from Patient here for follow-up on right lateral foot ulcer and left foot ulcers History of Present Illness (HPI) 04/06/16; this is a 83 year old woman who arrives accompanied by 2 daughters for a wound on her left ankle and her left fifth toe. These have apparently been present for a year. I'm not quite certain how she came to this clinic however she was being followed by Sharlotte Alamo her podiatrist for these wounds. She was also referred to [facility] and they apparently did a test presumably arterial studies although we don't have any of these results and we couldn't get through to the office today. The family but has been applying a combination of Bactroban and a light bandage and perhaps more recently Silvadene cream. She did have an x-ray of the foot roughly 6 months ago at the podiatry office the family was unaware that if there were any abnormalities. Apparently they  have not seen any healing here. Our intake nurse noted a  slight skin tear on the right anterior lower leg. Nobody seemed aware of this. ABIs calculated in this clinic was 0.3 on the right and 0.4 on the left I have reviewed things [facility]. There is very little information on this patient. She apparently follows in current total clinic which we don't have information from. She has mentioned already been to a [facility]. She has a history of hypothyroidism, nephrolithiasis arthritis and has had a previous mastectomy. 04/13/16; patient's x-ray was normal. She is already been to see Dr. Delana Meyer vascular surgery. Her arterial exam was from November 2016 this showed a left ABI of 0.62 her right of 0.76. Her duplex ultrasound of the left leg showed biphasic waves in the common femoral and distal femoral artery however monophasic waves in the superficial femoral artery proximal and mid biphasic it distal. 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 The patient tells me that she has pain at night when she tries to lie down which is improved by getting up and sitting in the chair this sounds like claudication at rest. Her wounds are on the left medial malleolus and the dorsal fifth toe small punched out wounds that are right on bone. We use Santyl last week 04/20/16: nurse informed me pt has declined evaluation for significant PAD. she denies systemic s/s of infections. 04/27/16;; the patient has had noninvasive arterial studies done in November 2016. ABI and the left was 0.62. Monophasic waves at the superficial femoral artery. Occluded to the posterior tibial artery. So had greater than 50% stenosis of the right superficial femoral and greater than 50% stenosis of the left superficial femoral artery. She had bilateral tibial peroneal artery disease. Both of the wounds on the left fifth toe and left lateral malleolus have been present for more than a year. They have been to see vein and vascular. The patient has some pain but miraculously I think the wounds  have largely been stable. No Crotteau, Mishel ([id number]) evidence of infection 05/04/16; she goes for a noninvasive study tomorrow and then sees Dr. Fletcher Anon on Monday. By the time she is here next week we should have a better picture of whether something can be done with regards to her arterial flow. We continue to have ischemic-looking wounds on the left fifth toe and left lateral malleolus. 05/10/16; the patient went for her arterial studies and saw Dr. Fletcher Anon. As predicted she is felt to have critical limb ischemia. The feeling is that she has occlusion of the SFA. The feeling would she would be a candidate for a stent to the SFA. The patient did not make a decision to proceed with the procedure and she is here with family members to discuss this me today. He shouldn't has a lot of pain and cannot sleep and rest well at night per her family. 05/24/16; the patient went and had a complex revascularization/angioplasty of the left superficial femoral artery followed by drug-coated balloon angioplasty and spots stenting. She tolerated the procedure well. She was recommended for dual antiplatelet drugs with Plavix and aspirin for at least a month. She went yesterday for I believe follow-up serial Dopplers and ABIs although I don't see these results. The patient is unfortunately complaining of a lot of pain in her bilateral lower legs below the knees from the ankle to the knees. She apparently was prescribed lidocaine and apparently put this on her legs instead of over the wound areas. This may  have something to do with it however there is a lot of edema in her bilateral legs I was able to find her arterial studies from yesterday. The left ABI has improved up to 0.66 post left SFA stent. The bilateral great toe indices remain abnormal with the left being in the 0.25 range. Duplex ultrasound showed her left SFA stent is patent monophasic waveforms persist in the left leg 06/07/16; continued punched-out  areas over the dorsal left fifth toe and left lateral malleolus. No major improvement 06/28/16; the areas over her dorsal left fifth toe and left lateral malleolus are covered in surface slough we are using Iodoflex 07/05/16. We have been using Iodoflex for 2-3 weeks now. I have not been debridement is because of pain 07/19/16 currently we have been using Iodoflex for roughly the past month. Previously we were unable to debride due to pain although today patient states that the pain is not nearly as severe as it has been in the past. In fact she rates this to be a 1 out of 10 and it worse to a to 10 with palpation of the wound. All and all her and her daughter feel like this is actually doing steadily better at this point in time. She is pleased with progress and we have been seeing her every 2 weeks. 08/02/16; this is a delightful 83 year old woman I have not seen in over a month. She has arterial insufficiency wounds remaining over the left lateral malleolus and the left fifth toe. With the help of Dr. Fletcher Anon we are able to get her revascularized on the left. The 2 wounds on the left have been making good progress and are definitely smaller especially the area over the left lateral malleolus. She is certainly in a lot less pain than she used to be although I still think there is some claudication type pain. Unfortunately she is developed a area on the right lateral foot which is a small open area but I think is threatened. I suspect a small ischemic areas well. Also on her right fifth toe there is an area that is not open but looks as though it is receiving too much pressure from footwear. Finally an area over her right malleolus although I don't think this is on its way to anything ominous 08/17/16 patient presents today for follow up evaluation she tells me that she is really doing fairly well from a pain standpoint compared to where she has been previous. She is tolerating the dressing changes  without any complication. She continues to have discharge and drainage however. 08-30-16 Ms. Cleere presents today with her daughter, she states that she continues to have intermittent shooting pains to the left lateral fifth toe at this site of seems to be a healed ulcer. She denies any other issues that her wound related since her last appointment. Her daughter states that she is in need of a tramadol refill at this time as she uses half a tablet at at bedtime to aid in sleep due to foot pain. Iodoflex has been used on all wounds in previous dressing changes. 09/13/16; the patient has ischemic wounds in her feet. The area over the left lateral malleolus is healed and Gibas, Sandrika ([id number]) the area over the left fifth toe looks improved.. She has an area on the lateral aspect of her right foot which is a small but deep wound. Finally she has a new open area on the medial aspect of the left medial malleolus. This is superficial  09/27/16; the area over the left lateral malleolus remains healed. The area over the left fifth toe has a surface and she states the pain is better but I don't think this is completely closed. She has an area on the lateral aspect of her right foot is a small but deep wound. The new open area on the medial aspect of the left ankle was closed from last time. 10/18/16; the area over her left lateral malleolus remains healed. The area over the left fifth toe has a surface over the top of this however there is no overt open area. Given the underlying issues of severe PAD and continued pain in the toe I would think it would be unlikely this is truly healed however I am not planning to debridement this area. The area on the right lateral foot which was a more recent wound is a small punched out painful area again has significant surface slough and nonviable tissue. It is clear the patient still has claudication type pain however she remains functional. I have been giving  her tramadol when necessary and that seems to help a lot with her pain the patient follows up with Dr. Fletcher Anon on January 18/18 11/01/16; patient missed her follow-up with Dr. Fletcher Anon last week due to a snow day. Follow-up is now on February 15. The areas on the right lateral malleolus and dorsal right fifth toe remain closed over. The fifth toe was tentative is there is a surface eschar however I'm not going to disturb this. Therefore, her only open areas on the right lateral foot. This is a small punched out area. We have been using Prisma. I'm quite convinced this is an ischemic wound 11/15/16; patient has follow-up with Dr. Fletcher Anon on February 15. She has no open wound on the left leg and doesn't really complain of claudication that I can determine from talking to her. However on the right leg she clearly has some degree of claudication. The remaining open wound is on the right lateral foot. We have been using Iodosorb ointment to/20/18; patient saw Dr. Fletcher Anon on 11/24/16. His comment is that her wound on her right lateral foot seems to be improving with local wound care. She has known significant right SFA disease. Her lower extremity Doppler will be repeated and according to the patient's daughter that appointment is on March 15. He is left with the thought that endovascular intervention in the right SFA might be necessary. The patient has quite a bit of pain especially at night related to the wound in her right foot. We changed her to Iodosorb ointment last week Objective Constitutional Sitting or standing Blood Pressure is within target range for patient.. Pulse regular and within target range for patient.Marland Kitchen Respirations regular, non-labored and within target range.. Temperature is normal and within the target range for the patient.. Patient's appearance is neat and clean. Appears in no acute distress. Well nourished and well developed.. Vitals Time Taken: 11:13 AM, Height: 64 in, Weight: 158  lbs, BMI: 27.1, Temperature: 98.0 F, Pulse: 67 bpm, Respiratory Rate: 16 breaths/min, Blood Pressure: 146/48 mmHg. Respiratory Respiratory effort is easy and symmetric bilaterally. Rate is normal at rest and on room air.Natale Milch, Estill Bamberg ([id number]) Cardiovascular Pedal pulses absent bilaterally.. General Notes: Wound exam; she has a small punched out area on the right lateral foot. The wound measures smaller and overall circumference and perhaps slightly less deep. Still using a #3 curette I removed necrotic surface material. Post debridement the wound looks fairly stable. No  hemostasis was necessary. Integumentary (Hair, Skin) Wound #4 status is Open. Original cause of wound was Gradually Appeared. The wound is located on the Right,Lateral Foot. The wound measures 0.3cm length x 0.5cm width x 0.2cm depth; 0.118cm^2 area and 0.024cm^3 volume. The wound is limited to skin breakdown. There is no tunneling or undermining noted. There is a large amount of serous drainage noted. The wound margin is distinct with the outline attached to the wound base. There is medium (34-66%) pink granulation within the wound bed. There is a medium (34- 66%) amount of necrotic tissue within the wound bed including Adherent Slough. Periwound temperature was noted as No Abnormality. The periwound has tenderness on palpation. 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 There was a Skin/Subcutaneous Tissue Debridement [id number]) debridement with total area of 0.15 sq  cm performed by Ricard Dillon, MD. with the following instrument(s): Curette to remove Viable and Non-Viable tissue/material including Exudate, Fibrin/Slough, and Subcutaneous after achieving pain control using Lidocaine 4% Topical Solution. A time out was conducted at 11:27, prior to the start of the procedure. A Minimum amount of bleeding was controlled with Pressure. The procedure was tolerated well with a pain level of 0 throughout and a pain level of 0 following the procedure. Post Debridement Measurements: 0.3cm length x 0.5cm width x 0.2cm depth; 0.024cm^3 volume. Character of Wound/Ulcer Post Debridement requires further debridement. Severity of Tissue Post Debridement is: Fat layer exposed. PATERICIA, COLOMBE ([id number]) Post procedure Diagnosis Wound #4: Same as Pre-Procedure Plan Wound Cleansing: Wound #4 Right,Lateral Foot: Clean wound with Normal Saline. - for clinic use Cleanse wound with mild soap and water May Shower, gently pat wound dry prior to applying new dressing. Anesthetic: Wound #4 Right,Lateral Foot: Topical Lidocaine 4% cream applied to wound bed prior to debridement - for clinic use Skin Barriers/Peri-Wound Care: Wound #4 Right,Lateral Foot: Skin Prep Primary Wound Dressing: Wound #4 Right,Lateral Foot: Iodosorb Ointment Secondary Dressing: Wound #4 Right,Lateral Foot: Dry Gauze Boardered Foam Dressing Dressing Change Frequency: Wound #4 Right,Lateral Foot: Change dressing every other day. Follow-up Appointments: Wound #4 Right,Lateral Foot: Return Appointment in 2 weeks. Edema Control: Wound #4 Right,Lateral Foot: Elevate legs to the level of the heart and pump ankles as often as possible Other: - compression stockings Additional Orders / Instructions: Wound #4 Right,Lateral Foot: Increase protein intake. Medications-please add to medication list.: Wound #4 Right,Lateral Foot: Other: - Vitamin C, Zinc, Multivitamins Tramadol Thum,  Gracen ([id number]) o o #1 we're going to continue the Iodosorb ointment. #2 the wound is slightly smaller today but still requires debridement #3 I note the plan by Dr. Fletcher Anon. Arterial Dopplers are booked for March 15 and then consideration of endovascular intervention if this wound does not progress towards healing #4 I havegiven her Tramadol 50 mg po q6h prn #40 predominanlty for the hs pain in her right foot Electronic Signature(s) Signed: 11/30/2016 9:58:44 AM By: [name] MD Entered By: [name] on 11/29/2016 11:40:23 [name] ([id number]) -------------------------------------------------------------------------------- SuperBill Details Patient Name: [name] Date of Service: 11/29/2016 Medical Record Patient Account Number: [id number] Number:  Treating RN: Carolyne Fiscal, Debi 05/18/1919 (83 y.o. 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

## 2016-11-30 NOTE — Progress Notes (Signed)
GLENROSE, NAIK ([id number]) Visit Report for 11/29/2016 Arrival Information Details Patient Name: [name], [name] Date of Service: 11/29/2016 11:00 AM Medical Record Patient Account Number: [id number] Number: Treating RN: Ahmed Prima June 25, 1919 (83 y.o. Other Clinician: Date of Birth/Sex: Female) Treating ROBSON, [place] Primary Care Yago Ludvigsen: [place] Judeth Gilles/Extender: G Referring Pau Banh: Vinson Moselle in Treatment: 33 Visit Information History Since Last Visit All ordered tests and consults were completed: No Patient Arrived: Ambulatory Added or deleted any medications: No Arrival Time: 11:13 Any new allergies or adverse reactions: No Accompanied By: daughter Had a fall or experienced change in No Transfer Assistance: None activities of daily living that may affect Patient Identification Verified: Yes risk of falls: Secondary Verification Process Yes Signs or symptoms of abuse/neglect since last No Completed: visito Patient Requires Transmission- No Hospitalized since last visit: No Based Precautions: Has Dressing in Place as Prescribed: Yes Patient Has Alerts: Yes Pain Present Now: No Patient Alerts: 7/31 ABI: R-0.55 L-0.40 [facility] Electronic Signature(s) Signed: 11/29/2016 4:45:40 PM By: Alric Quan Entered By: Alric Quan on 11/29/2016 11:13:30 [name] ([id number]) -------------------------------------------------------------------------------- Encounter Discharge Information Details Patient Name: [name] Date of Service: 11/29/2016 11:00 AM Medical Record Patient Account Number: [id number] Number: Treating RN: Ahmed Prima 08/06/1919 (83 y.o. Other Clinician: Date of Birth/Sex: Female) Treating ROBSON, [place] Primary Care Ata Pecha: [place] Ivan Maskell/Extender: G Referring Chord Takahashi: Vinson Moselle in Treatment: 33 Encounter Discharge Information  Items Discharge Pain Level: 0 Discharge Condition: Stable Ambulatory Status: Ambulatory Discharge Destination: Home Transportation: Private Auto Accompanied By: daughter Schedule Follow-up Appointment: Yes Medication Reconciliation completed and provided to Patient/Care No [name]: Provided on Clinical Summary of Care: 11/29/2016 Form Type Recipient Paper Patient [facility] Electronic Signature(s) Signed: 11/29/2016 11:39:34 AM By: Ruthine Dose Entered By: Ruthine Dose on 11/29/2016 11:39:34 [name] ([id number]) -------------------------------------------------------------------------------- Lower Extremity Assessment Details Patient Name: [name] Date of Service: 11/29/2016 11:00 AM Medical Record Patient Account Number: [id number] Number: Treating RN: Ahmed Prima 12/07/1918 (83 y.o. Other Clinician: Date of Birth/Sex: Female) Treating ROBSON, [place] Primary Care Tanija Germani: [place] Shaquinta Peruski/Extender: G Referring Larna Capelle: [place] Weeks in Treatment: 33 Vascular Assessment Pulses: Dorsalis Pedis Palpable: [Right:Yes] Posterior Tibial Extremity colors, hair growth, and conditions: Extremity Color: [Right:Pale] Temperature of Extremity: [Right:Warm] Capillary Refill: [Right:< 3 seconds] Electronic Signature(s) Signed: 11/29/2016 4:45:40 PM By: Alric Quan Entered By: Alric Quan on 11/29/2016 11:21:11 [name] ([id number]) -------------------------------------------------------------------------------- Multi Wound Chart Details Patient Name: [name] Date of Service: 11/29/2016 11:00 AM Medical Record Patient Account Number: [id number] Number: Treating RN: Ahmed Prima 09/30/1919 (83 y.o. 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 N/A] Comorbid History: [4:Cataracts, Hypertension, N/A Osteoarthritis] Date Acquired: [4:07/26/2016] [N/A:N/A] Weeks of Treatment: [4:17] [N/A:N/A] Wound Status: [4:Open] [N/A:N/A] Measurements L x W x D 0.3x0.5x0.2 [N/A:N/A] (cm) Area (cm) : [4:0.118] [N/A:N/A] Volume (cm) : [4:0.024] [N/A:N/A] % Reduction in Area: [4:-151.10%] [N/A:N/A] % Reduction in Volume: -380.00% [N/A:N/A] Classification: [4:Partial Thickness] [N/A:N/A] Exudate Amount: [4:Large] [N/A:N/A] Exudate Type: [4:Serous] [N/A:N/A] Exudate Color: [4:amber] [N/A:N/A] Wound Margin: [4:Distinct, outline attached N/A] Granulation Amount: [4:Medium (34-66%)] [N/A:N/A] Granulation Quality: [4:Pink] [N/A:N/A] Necrotic Amount: [4:Medium (34-66%)] [N/A:N/A] Exposed Structures: [4:Fascia: No Fat  Layer (Subcutaneous Tissue) Exposed: No Tendon: No Muscle: No] [N/A:N/A] Joint: No Bone: No Limited to Skin Breakdown Epithelialization: None N/A N/A Debridement: Debridement [id number] N/A N/A [id number]) Pre-procedure 11:27 N/A N/A Verification/Time Out Taken: Pain Control: Lidocaine 4% Topical N/A N/A Solution Tissue Debrided: Fibrin/Slough, Exudates, N/A N/A Subcutaneous Level: Skin/Subcutaneous N/A N/A Tissue Debridement Area (sq 0.15 N/A N/A cm): Instrument: Curette N/A N/A Bleeding: Minimum N/A N/A Hemostasis Achieved: Pressure N/A N/A Procedural Pain: 0 N/A N/A Post Procedural Pain: 0 N/A N/A Debridement Treatment Procedure was tolerated N/A N/A Response: well Post Debridement 0.3x0.5x0.2 N/A N/A Measurements L x W x D (cm) Post Debridement 0.024 N/A  N/A Volume: (cm) Periwound Skin Texture: No Abnormalities Noted N/A N/A Periwound Skin No Abnormalities Noted N/A N/A Moisture: Periwound Skin Color: No Abnormalities Noted N/A N/A Temperature: No Abnormality N/A N/A Tenderness on Yes N/A N/A Palpation: Wound Preparation: Ulcer Cleansing: N/A N/A Rinsed/Irrigated with Saline Topical Anesthetic Applied: Other: lidocaine 4% Procedures Performed: Debridement N/A N/A Treatment Notes Wound #4 (Right, Lateral Foot) 1. Cleansed withBETHANN, KAWA ([id number]) Clean wound with Normal Saline 2. Anesthetic Topical Lidocaine 4% cream to wound bed prior to debridement 3. Peri-wound Care: Skin Prep 4. Dressing Applied: Iodosorb Ointment 5. Secondary Dressing Applied Bordered Foam Dressing Dry Gauze Electronic Signature(s) Signed: 11/30/2016 9:58:44 AM By: [name] MD Entered By: [name] on 11/29/2016 11:32:57 [name] ([id number]) -------------------------------------------------------------------------------- Multi-Disciplinary Care Plan Details Patient Name: [name] Date of Service: 11/29/2016 11:00 AM Medical Record Patient Account Number: [id number] Number: Treating RN: Ahmed Prima 1919/01/14 (83 y.o. Other Clinician: Date of Birth/Sex: Female) Treating ROBSON, [place] Primary Care Jasslyn Finkel: [place] Diago Haik/Extender: G Referring Tamara Monteith: Vinson Moselle in Treatment: 78 Active Inactive ` Abuse / Safety / Falls / Self Care Management Nursing Diagnoses: Potential for falls Goals: Patient will remain injury free Date Initiated: 04/06/2016 Target Resolution Date: 12/22/2016 Goal Status: Active Interventions: Assess fall risk on admission and as needed Notes: ` Nutrition Nursing Diagnoses: Imbalanced nutrition Goals: Patient/caregiver agrees to and verbalizes understanding of need to use nutritional supplements and/or vitamins as prescribed Date  Initiated: 04/06/2016 Target Resolution Date: 12/22/2016 Goal Status: Active Interventions: Assess patient nutrition upon admission and as needed per policy Notes: ` Orientation to the [place], [place] ([id number]) Nursing Diagnoses: Knowledge deficit related to the wound healing center program Goals: Patient/caregiver will verbalize understanding of the [place] Date Initiated: 04/06/2016 Target Resolution Date: 12/22/2016 Goal Status: Active Interventions: Provide education on orientation to the wound center Notes: ` Pain, Acute or Chronic Nursing Diagnoses: Pain, acute or chronic: actual or potential Potential alteration in comfort, pain Goals: Patient will verbalize adequate pain control and receive pain control interventions during procedures as needed Date Initiated: 04/06/2016 Target Resolution Date: 12/22/2016 Goal Status: Active Interventions: Assess comfort goal upon admission Complete pain assessment as per visit requirements Notes: ` Wound/Skin Impairment Nursing Diagnoses: Impaired tissue integrity Goals: Ulcer/skin breakdown will have a volume reduction of 30% by week 4 Date Initiated: 04/06/2016 Target Resolution Date: 12/22/2016 Goal Status: Active Ulcer/skin breakdown will have a volume reduction of 50% by week 8 Date Initiated: 04/06/2016 Target Resolution Date: 12/22/2016 Goal Status: Active Ulcer/skin breakdown will have a volume reduction of 80% by week 12 AAYAT, BOCKOVER ([id number]) Date Initiated: 04/06/2016 Target Resolution Date: 12/22/2016 Goal Status: Active Interventions: Assess patient/caregiver ability to obtain necessary supplies Assess ulceration(s) every visit Notes: Electronic Signature(s) Signed: 11/29/2016 4:45:40 PM By: Alric Quan Entered By: Alric Quan on 11/29/2016 11:21:17  NAKIDA, FURUYA  ([id number]) -------------------------------------------------------------------------------- Pain Assessment Details Patient Name: [name], [name] Date of Service: 11/29/2016 11:00 AM Medical Record Patient Account Number: [id number] Number: Treating RN: Ahmed Prima 1919-06-06 (83 y.o. Other Clinician: Date of Birth/Sex: Female) Treating ROBSON, MICHAEL Primary Care Shani Fitch: [place] Riham Polyakov/Extender: G Referring Erasmus Bistline: Vinson Moselle in Treatment: 33 Active Problems Location of Pain Severity and Description of Pain Patient Has Paino No Site Locations With Dressing Change: No Pain Management and Medication Current Pain Management: Electronic Signature(s) Signed: 11/29/2016 4:45:40 PM By: Alric Quan Entered By: Alric Quan on 11/29/2016 11:13:36 [name] ([id number]) -------------------------------------------------------------------------------- Patient/Caregiver Education Details Patient Name: [name] Date of Service: 11/29/2016 11:00 AM Medical Record Patient Account Number: [id number] Number: Treating RN: Carolyne Fiscal, Debi [birth year] (83 y.o. Other Clinician: Date of Birth/Gender: Female) Treating ROBSON, [place] Primary Care Physician: [place] Physician/Extender: G Referring Physician: Vinson Moselle in Treatment: 30 Education Assessment Education Provided To: Patient Education Topics Provided Wound/Skin Impairment: Handouts: Other: change dressing as ordered Methods: Demonstration, Explain/Verbal Responses: State content correctly Electronic Signature(s) Signed: 11/29/2016 4:45:40 PM By: Alric Quan Entered By: Alric Quan on 11/29/2016 11:30:24 [name] ([id number]) -------------------------------------------------------------------------------- Wound Assessment Details Patient Name: [name] Date of Service: 11/29/2016 11:00 AM Medical  Record Patient Account Number: [id number] Number: Treating RN: Ahmed Prima 10/18/1918 (83 y.o. Other Clinician: Date of Birth/Sex: Female) Treating ROBSON, [place] Primary Care Sevana Grandinetti: [place] Kirsten Mckone/Extender: G Referring Hever Castilleja: [place] Weeks in Treatment: 33 Wound Status Wound Number: 4 Primary Arterial Insufficiency Ulcer Etiology: Wound Location: Right Foot - Lateral Wound Status: Open Wounding Event: Gradually Appeared Comorbid Cataracts, Hypertension, Date Acquired: 07/26/2016 History: Osteoarthritis Weeks Of Treatment: 17 Clustered Wound: No Photos Photo Uploaded By: Alric Quan on 11/29/2016 16:54:18 Wound Measurements Length: (cm) 0.3 Width: (cm) 0.5 Depth: (cm) 0.2 Area: (cm) 0.118 Volume: (cm) 0.024 % Reduction in Area: -151.1% % Reduction in Volume: -380% Epithelialization: None Tunneling: No Undermining: No Wound Description Classification: Partial Thickness Foul Odor Aft Wound Margin: Distinct, outline attached Exudate Amount: Large Exudate Type: Serous Exudate Color: amber er Cleansing: No Wound Bed Granulation Amount: Medium (34-66%) Exposed Structure Granulation Quality: Pink Fascia Exposed: No Necrotic Amount: Medium (34-66%) Fat Layer (Subcutaneous Tissue) Exposed: No Moris, Gavriela ([id number]) Necrotic Quality: Adherent Slough Tendon Exposed: No Muscle Exposed: No Joint Exposed: No Bone Exposed: No Limited to Skin Breakdown Periwound Skin Texture Texture Color No Abnormalities Noted: No No Abnormalities Noted: No Moisture Temperature / Pain No Abnormalities Noted: No Temperature: No Abnormality Tenderness on Palpation: Yes Wound Preparation Ulcer Cleansing: Rinsed/Irrigated with Saline Topical Anesthetic Applied: Other: lidocaine 4%, Treatment Notes Wound #4 (Right, Lateral Foot) 1. Cleansed with: Clean wound with Normal Saline 2. Anesthetic Topical Lidocaine 4% cream to  wound bed prior to debridement 3. Peri-wound Care: Skin Prep 4. Dressing Applied: Iodosorb Ointment 5. Secondary Dressing Applied Bordered Foam Dressing Dry Gauze Electronic Signature(s) Signed: 11/29/2016 4:45:40 PM By: Alric Quan Entered By: Alric Quan on 11/29/2016 11:20:07 [name] ([id number]) -------------------------------------------------------------------------------- [place] Details Patient Name: [name] Date of Service: 11/29/2016 11:00 AM Medical Record Patient Account Number: [id number] Number: Treating RN: Ahmed Prima 21-Sep-1919 (83 y.o. Other Clinician: Date of Birth/Sex: Female) Treating ROBSON, MICHAEL Primary Care Kaylynn Chamblin: [place] Ayde Record/Extender: G Referring Zissy Hamlett: [place] Weeks in Treatment: 33 Vital Signs Time Taken: 11:13 Temperature (F): 98.0 Height (in): 64 Pulse (bpm): 67 Weight (lbs): 158 Respiratory Rate (breaths/min): 16 Body Mass Index (BMI): 27.1 Blood Pressure (mmHg): 146/48 Reference Range: 80 - 120 mg /  dl Electronic Signature(s) Signed: 11/29/2016 4:45:40 PM By: Alric Quan Entered By: Alric Quan on 11/29/2016 11:15:25

## 2016-12-02 ENCOUNTER — Other Ambulatory Visit: Payer: Self-pay | Admitting: Cardiovascular Disease

## 2016-12-02 DIAGNOSIS — I779 Disorder of arteries and arterioles, unspecified: Secondary | ICD-10-CM

## 2016-12-13 ENCOUNTER — Encounter: Payer: Medicare PPO | Attending: Internal Medicine | Admitting: Internal Medicine

## 2016-12-13 DIAGNOSIS — I70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: Secondary | ICD-10-CM | POA: Diagnosis present

## 2016-12-13 DIAGNOSIS — L97512 Non-pressure chronic ulcer of other part of right foot with fat layer exposed: Secondary | ICD-10-CM | POA: Diagnosis not present

## 2016-12-13 DIAGNOSIS — L97523 Non-pressure chronic ulcer of other part of left foot with necrosis of muscle: Secondary | ICD-10-CM | POA: Insufficient documentation

## 2016-12-13 DIAGNOSIS — I70243 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: Secondary | ICD-10-CM | POA: Diagnosis not present

## 2016-12-13 DIAGNOSIS — E039 Hypothyroidism, unspecified: Secondary | ICD-10-CM | POA: Diagnosis not present

## 2016-12-13 DIAGNOSIS — M1990 Unspecified osteoarthritis, unspecified site: Secondary | ICD-10-CM | POA: Insufficient documentation

## 2016-12-15 NOTE — Progress Notes (Signed)
[name], [name] ([id number]) Visit Report for 12/13/2016 Arrival Information Details Patient Name: ALAIA, [name] Date of Service: 12/13/2016 11:00 AM Medical Record Patient Account Number: [id number] Number: Treating [name]: [name] 04-16-1919 (83 y.o. Other Clinician: Date of Birth/Sex: Female) Treating [name], [name] Primary Care [name]: [name] [name]/Extender: G Referring [name]: [name] in Treatment: 27 Visit Information History Since Last Visit Added or deleted any medications: No Patient Arrived: Walker Any new allergies or adverse reactions: No Arrival Time: 11:06 Had a fall or experienced change in No Accompanied By: daughter activities of daily living that may affect Transfer Assistance: None risk of falls: Patient Identification Verified: Yes Signs or symptoms of abuse/neglect since last No Secondary Verification Process Yes visito Completed: Hospitalized since last visit: No Patient Requires Transmission-Based No Has Dressing in Place as Prescribed: Yes Precautions: Pain Present Now: No Patient Has Alerts: Yes Electronic Signature(s) Signed: 12/14/2016 12:55:26 PM By: [name] Cool, [name], BSN, [name], BSN Entered By: [name] Cool, [name], BSN, [name] on 12/13/2016 11:07:17 [name] ([id number]) -------------------------------------------------------------------------------- Clinic Level of Care Assessment Details Patient Name: [name] Date of Service: 12/13/2016 11:00 AM Medical Record Patient Account Number: [id number] Number: Treating [name]: [name] [birth year] (83 y.o. Other Clinician: Date of Birth/Sex: Female) Treating [name] Primary Care Kendarious Gudino: [name] Biannca Scantlin/Extender: G Referring [name]: [name] in Treatment: 35 Clinic Level of Care Assessment Items TOOL 4 Quantity Score []  - Use when only an EandM is performed on FOLLOW-UP visit 0 ASSESSMENTS - Nursing  Assessment / Reassessment []  - Reassessment of Co-morbidities (includes updates in patient status) 0 X - Reassessment of Adherence to Treatment Plan 1 5 ASSESSMENTS - Wound and Skin Assessment / Reassessment X - Simple Wound Assessment / Reassessment - one wound 1 5 []  - Complex Wound Assessment / Reassessment - multiple wounds 0 []  - Dermatologic / Skin Assessment (not related to wound area) 0 ASSESSMENTS - Focused Assessment []  - Circumferential Edema Measurements - multi extremities 0 []  - Nutritional Assessment / Counseling / Intervention 0 []  - Lower Extremity Assessment (monofilament, tuning fork, pulses) 0 []  - Peripheral Arterial Disease Assessment (using hand held doppler) 0 ASSESSMENTS - Ostomy and/or Continence Assessment and Care []  - Incontinence Assessment and Management 0 []  - Ostomy Care Assessment and Management (repouching, etc.) 0 PROCESS - Coordination of Care X - Simple Patient / Family Education for ongoing care 1 15 []  - Complex (extensive) Patient / Family Education for ongoing care 0 X - Staff obtains Programmer, systems, Records, Test Results / Process Orders 1 10 []  - Staff telephones HHA, Nursing Homes / Clarify orders / etc 0 [name], [name] ([id number]) []  - Routine Transfer to another Facility (non-emergent condition) 0 []  - Routine Hospital Admission (non-emergent condition) 0 []  - New Admissions / Biomedical engineer / Ordering NPWT, Apligraf, etc. 0 []  - Emergency Hospital Admission (emergent condition) 0 X - Simple Discharge Coordination 1 10 []  - Complex (extensive) Discharge Coordination 0 PROCESS - Special Needs []  - Pediatric / Minor Patient Management 0 []  - Isolation Patient Management 0 []  - Hearing / Language / Visual special needs 0 []  - Assessment of Community assistance (transportation, D/C planning, etc.) 0 []  - Additional assistance / Altered mentation 0 []  - Support Surface(s) Assessment (bed, cushion, seat, etc.) 0 INTERVENTIONS - Wound  Cleansing / Measurement X - Simple Wound Cleansing - one wound 1 5 []  - Complex Wound Cleansing - multiple wounds 0 X - Wound Imaging (photographs - any number of wounds) 1 5 []  -  Wound Tracing (instead of photographs) 0 X - Simple Wound Measurement - one wound 1 5 []  - Complex Wound Measurement - multiple wounds 0 INTERVENTIONS - Wound Dressings X - Small Wound Dressing one or multiple wounds 1 10 []  - Medium Wound Dressing one or multiple wounds 0 []  - Large Wound Dressing one or multiple wounds 0 []  - Application of Medications - topical 0 []  - Application of Medications - injection 0 [name], [name] ([id number]) INTERVENTIONS - Miscellaneous []  - External ear exam 0 []  - Specimen Collection (cultures, biopsies, blood, body fluids, etc.) 0 []  - Specimen(s) / Culture(s) sent or taken to Lab for analysis 0 []  - Patient Transfer (multiple staff / Harrel Lemon Lift / Similar devices) 0 []  - Simple Staple / Suture removal (25 or less) 0 []  - Complex Staple / Suture removal (26 or more) 0 []  - Hypo / Hyperglycemic Management (close monitor of Blood Glucose) 0 []  - Ankle / Brachial Index (ABI) - do not check if billed separately 0 X - Vital Signs 1 5 Has the patient been seen at the hospital within the last three years: Yes Total Score: 75 Level Of Care: New/Established - Level 2 Electronic Signature(s) Signed: 12/14/2016 12:55:26 PM By: [name] Cool, [name], BSN, [name], BSN Entered By: [name] Cool, [name], BSN, [name] on 12/13/2016 11:39:51 [name] ([id number]) -------------------------------------------------------------------------------- Encounter Discharge Information Details Patient Name: [name] Date of Service: 12/13/2016 11:00 AM Medical Record Patient Account Number: [id number] Number: Treating [name]: [name] [birth year] (83 y.o. Other Clinician: Date of Birth/Sex: Female) Treating [name], [place] Primary Care Alivia Cimino: [name] Jamee Pacholski/Extender: G Referring  Ettie Krontz: [name] in Treatment: 35 Encounter Discharge Information Items Discharge Pain Level: 0 Discharge Condition: Stable Ambulatory Status: Walker Discharge Destination: Home Transportation: Private Auto Accompanied By: daughter Schedule Follow-up Appointment: Yes Medication Reconciliation completed Yes and provided to Patient/Care Marquese Burkland: Provided on Clinical Summary of Care: 12/13/2016 Form Type Recipient Paper Patient [facility] Electronic Signature(s) Signed: 12/14/2016 12:55:26 PM By: [name] Cool, [name], BSN, [name], BSN Previous Signature: 12/13/2016 11:42:09 AM Version By: Ruthine Dose Entered By: [name] Cool [name], BSN, [name] on 12/13/2016 11:45:59 [name] ([id number]) -------------------------------------------------------------------------------- Lower Extremity Assessment Details Patient Name: [name] Date of Service: 12/13/2016 11:00 AM Medical Record Patient Account Number: [id number] Number: Treating [name]: [name] 1919-09-20 (83 y.o. Other Clinician: Date of Birth/Sex: Female) Treating [name], [place] Primary Care Beaulah Romanek: [name] Shaquilla Kehres/Extender: G Referring Torben Soloway: [name] Weeks in Treatment: 35 Edema Assessment Assessed: [Left: No] [Right: No] Edema: [Left: Ye] [Right: s] Calf Left: Right: Point of Measurement: 34 cm From Medial Instep cm 33 cm Ankle Left: Right: Point of Measurement: 10 cm From Medial Instep cm 22.2 cm Vascular Assessment Claudication: Claudication Assessment [Right:None] Pulses: Dorsalis Pedis Palpable: [Right:Yes] Posterior Tibial Palpable: [Right:Yes] Extremity colors, hair growth, and conditions: Extremity Color: [Right:Hyperpigmented] Hair Growth on Extremity: [Right:Yes] Temperature of Extremity: [Right:Warm] Capillary Refill: [Right:< 3 seconds] Dependent Rubor: [Right:No] Blanched when Elevated: [Right:No] Lipodermatosclerosis: [Right:No] Toe Nail Assessment Left:  Right: Thick: No Discolored: No Deformed: No DOLINSKI, [place] ([id number]) Improper Length and Hygiene: No Electronic Signature(s) Signed: 12/14/2016 12:55:26 PM By: [name] Cool, [name], BSN, [name], BSN Entered By: [name] Cool, [name], BSN, [name] on 12/13/2016 11:17:08 [name] ([id number]) -------------------------------------------------------------------------------- Multi Wound Chart Details Patient Name: [name] Date of Service: 12/13/2016 11:00 AM Medical Record Patient Account Number: [id number] Number: Treating [name]: [name] [birth year] (83 y.o. Other Clinician: Date of Birth/Sex: Female) Treating [name], [name] Primary Care Verdia Bolt: [name] Tandra Rosado/Extender: G Referring  [name] Colan: [name] Weeks in Treatment: 35 Vital Signs Height(in): 64 Pulse(bpm): 62 Weight(lbs): 158 Blood Pressure 148/58 (mmHg): Body Mass Index(BMI): 27 Temperature(F): 97.9 Respiratory Rate 16 (breaths/min): Photos: [N/A:N/A] Wound Location: Right Foot - Lateral N/A N/A Wounding Event: Gradually Appeared N/A N/A Primary Etiology: Arterial Insufficiency Ulcer N/A N/A Comorbid History: Cataracts, Hypertension, N/A N/A Osteoarthritis Date Acquired: 07/26/2016 N/A N/A Weeks of Treatment: 19 N/A N/A Wound Status: Open N/A N/A Measurements L x W x D 0.4x0.3x0.2 N/A N/A (cm) Area (cm) : 0.094 N/A N/A Volume (cm) : 0.019 N/A N/A % Reduction in Area: -100.00% N/A N/A % Reduction in Volume: -280.00% N/A N/A Classification: Partial Thickness N/A N/A Exudate Amount: Large N/A N/A Exudate Type: Serous N/A N/A Exudate Color: amber N/A N/A Wound Margin: Distinct, outline attached N/A N/A Granulation Amount: Medium (34-66%) N/A N/A Granulation Quality: Pink N/A N/A Necrotic Amount: Medium (34-66%) N/A N/A DAWNETTE, MIONE ([id number]) Exposed Structures: Fascia: No N/A N/A Fat Layer (Subcutaneous Tissue) Exposed: No Tendon: No Muscle: No Joint: No Bone:  No Limited to Skin Breakdown Epithelialization: None N/A N/A Periwound Skin Texture: No Abnormalities Noted N/A N/A Periwound Skin No Abnormalities Noted N/A N/A Moisture: Periwound Skin Color: No Abnormalities Noted N/A N/A Temperature: No Abnormality N/A N/A Tenderness on Yes N/A N/A Palpation: Wound Preparation: Ulcer Cleansing: N/A N/A Rinsed/Irrigated with Saline Topical Anesthetic Applied: Other: lidocaine 4% Treatment Notes Wound #4 (Right, Lateral Foot) 1. Cleansed with: Clean wound with Normal Saline 2. Anesthetic Topical Lidocaine 4% cream to wound bed prior to debridement 4. Dressing Applied: Iodoflex 5. Secondary Dressing Applied Bordered Foam Dressing Notes iodosorb Electronic Signature(s) Signed: 12/14/2016 8:02:19 AM By: [name] MD Entered By: [name] on 12/13/2016 12:44:25 [name] ([id number]) -------------------------------------------------------------------------------- Multi-Disciplinary Care Plan Details Patient Name: [name] Date of Service: 12/13/2016 11:00 AM Medical Record Patient Account Number: [id number] Number: Treating [name]: [name] 1919-09-07 (83 y.o. Other Clinician: Date of Birth/Sex: Female) Treating [name], [place] Primary Care Merlinda Wrubel: [name] Bradan Congrove/Extender: G Referring Elyza Whitt: [name] in Treatment: 40 Active Inactive ` Abuse / Safety / Falls / Self Care Management Nursing Diagnoses: Potential for falls Goals: Patient will remain injury free Date Initiated: 04/06/2016 Target Resolution Date: 12/22/2016 Goal Status: Active Interventions: Assess fall risk on admission and as needed Notes: ` Nutrition Nursing Diagnoses: Imbalanced nutrition Goals: Patient/caregiver agrees to and verbalizes understanding of need to use nutritional supplements and/or vitamins as prescribed Date Initiated: 04/06/2016 Target Resolution Date: 12/22/2016 Goal Status:  Active Interventions: Assess patient nutrition upon admission and as needed per policy Notes: ` Orientation to the Wound Care Program [place] ([id number]) Nursing Diagnoses: Knowledge deficit related to the wound healing center program Goals: Patient/caregiver will verbalize understanding of the [place] Date Initiated: 04/06/2016 Target Resolution Date: 12/22/2016 Goal Status: Active Interventions: Provide education on orientation to the wound center Notes: ` Pain, Acute or Chronic Nursing Diagnoses: Pain, acute or chronic: actual or potential Potential alteration in comfort, pain Goals: Patient will verbalize adequate pain control and receive pain control interventions during procedures as needed Date Initiated: 04/06/2016 Target Resolution Date: 12/22/2016 Goal Status: Active Interventions: Assess comfort goal upon admission Complete pain assessment as per visit requirements Notes: ` Wound/Skin Impairment Nursing Diagnoses: Impaired tissue integrity Goals: Ulcer/skin breakdown will have a volume reduction of 30% by week 4 Date Initiated: 04/06/2016 Target Resolution Date: 12/22/2016 Goal Status: Active Ulcer/skin breakdown will have a volume reduction of 50% by week 8 Date Initiated: 04/06/2016 Target Resolution Date:  12/22/2016 Goal Status: Active Ulcer/skin breakdown will have a volume reduction of 80% by week 12 BRISA, AUTH ([id number]) Date Initiated: 04/06/2016 Target Resolution Date: 12/22/2016 Goal Status: Active Interventions: Assess patient/caregiver ability to obtain necessary supplies Assess ulceration(s) every visit Notes: Electronic Signature(s) Signed: 12/14/2016 12:55:26 PM By: [name] Cool, [name], BSN, [name], BSN Entered By: [name] Cool, [name], BSN, [name] on 12/13/2016 11:17:30 [name] ([id number]) -------------------------------------------------------------------------------- Pain Assessment Details Patient Name: [name] Date of Service: 12/13/2016 11:00 AM Medical Record Patient Account Number: [id number] Number: Treating [name]: [name] 21-May-1919 (83 y.o. Other Clinician: Date of Birth/Sex: Female) Treating [name], [name] Primary Care Andrya Roppolo: [name] Raeden Schippers/Extender: G Referring Alfreddie Consalvo: [name] in Treatment: 35 Active Problems Location of Pain Severity and Description of Pain Patient Has Paino No Site Locations With Dressing Change: No Pain Management and Medication Current Pain Management: Electronic Signature(s) Signed: 12/14/2016 12:55:26 PM By: [name] Cool, [name], BSN, [name], BSN Entered By: [name] Cool, [name], BSN, [name] on 12/13/2016 11:07:25 [name] ([id number]) -------------------------------------------------------------------------------- Patient/Caregiver Education Details Patient Name: [name] Date of Service: 12/13/2016 11:00 AM Medical Record Patient Account Number: [id number] Number: Treating [name]: [name] May 07, 1919 (83 y.o. Other Clinician: Date of Birth/Gender: Female) Treating [name], [place] Primary Care Physician: [name] Physician/Extender: G Referring Physician: Vinson [name] in Treatment: 35 Education Assessment Education Provided To: Patient and Caregiver Education Topics Provided Wound Debridement: Wound/Skin Impairment: Handouts: Caring for Your Ulcer Methods: Demonstration, Explain/Verbal Responses: State content correctly Electronic Signature(s) Signed: 12/14/2016 12:55:26 PM By: [name] Cool, [name], BSN, [name], BSN Entered By: [name] Cool, [name], BSN, [name] on 12/13/2016 11:46:22 [name] ([id number]) -------------------------------------------------------------------------------- Wound Assessment Details Patient Name: [name] Date of Service: 12/13/2016 11:00 AM Medical Record Patient Account Number: [id number] Number: Treating [name]: [name] [birth year] (83 y.o. Other  Clinician: Date of Birth/Sex: Female) Treating [name], [place] Primary Care Bastion Bolger: [name] Jianni Shelden/Extender: G Referring Dezaria Methot: [name] Weeks in Treatment: 35 Wound Status Wound Number: 4 Primary Arterial Insufficiency Ulcer Etiology: Wound Location: Right Foot - Lateral Wound Status: Open Wounding Event: Gradually Appeared Comorbid Cataracts, Hypertension, Date Acquired: 07/26/2016 History: Osteoarthritis Weeks Of Treatment: 19 Clustered Wound: No Photos Wound Measurements Length: (cm) 0.4 Width: (cm) 0.3 Depth: (cm) 0.2 Area: (cm) 0.094 Volume: (cm) 0.019 % Reduction in Area: -100% % Reduction in Volume: -280% Epithelialization: None Tunneling: No Undermining: No Wound Description Classification: Partial Thickness Wound Margin: Distinct, outline attached Exudate Amount: Large Exudate Type: Serous Exudate Color: amber Foul Odor After Cleansing: No Wound Bed Granulation Amount: Medium (34-66%) Exposed Structure Granulation Quality: Pink Fascia Exposed: No Necrotic Amount: Medium (34-66%) Fat Layer (Subcutaneous Tissue) Exposed: No Necrotic Quality: Adherent Slough Tendon Exposed: No Muscle Exposed: No Joint Exposed: No Deshpande, Deriana ([id number]) Bone Exposed: No Limited to Skin Breakdown Periwound Skin Texture Texture Color No Abnormalities Noted: No No Abnormalities Noted: No Moisture Temperature / Pain No Abnormalities Noted: No Temperature: No Abnormality Tenderness on Palpation: Yes Wound Preparation Ulcer Cleansing: Rinsed/Irrigated with Saline Topical Anesthetic Applied: Other: lidocaine 4%, Treatment Notes Wound #4 (Right, Lateral Foot) 1. Cleansed with: Clean wound with Normal Saline 2. Anesthetic Topical Lidocaine 4% cream to wound bed prior to debridement 4. Dressing Applied: Iodoflex 5. Secondary Dressing Applied Bordered Foam Dressing Notes iodosorb Electronic Signature(s) Signed: 12/14/2016  12:55:26 PM By: [name] Cool, [name], BSN, [name], BSN Entered By: [name] Cool, [name], BSN, [name] on 12/13/2016 11:13:59 [name] ([id number]) -------------------------------------------------------------------------------- Vitals Details Patient Name: [name] Date of Service: 12/13/2016 11:00 AM Medical Record Patient Account Number:  [id number] Number: Treating [name]: [name] 06/14/1919 (83 y.o. Other Clinician: Date of Birth/Sex: Female) Treating [name], [name] Primary Care Davaughn Hillyard: [name] Markus Casten/Extender: G Referring Akiba Melfi: [name] Weeks in Treatment: 35 Vital Signs Time Taken: 11:10 Temperature (F): 97.9 Height (in): 64 Pulse (bpm): 62 Weight (lbs): 158 Respiratory Rate (breaths/min): 16 Body Mass Index (BMI): 27.1 Blood Pressure (mmHg): 148/58 Reference Range: 80 - 120 mg / dl Electronic Signature(s) Signed: 12/14/2016 12:55:26 PM By: [name] Cool, [name], BSN, [name], BSN Entered By: [name] Cool, [name], BSN, [name] on 12/13/2016 11:12:40

## 2016-12-15 NOTE — Progress Notes (Signed)
RALONDA, TARTT ([id number]) Visit Report for 12/13/2016 Chief Complaint Document Details Patient Name: [name], [name] Date of Service: 12/13/2016 11:00 AM Medical Record Patient Account Number: [id number] Number: Treating RN: Cornell Barman 08-21-1919 (83 y.o. Other Clinician: Date of Birth/Sex: Female) Treating Froilan Mclean Primary Care Provider: Lorelee Market Provider/Extender: G Referring Provider: Vinson Moselle in Treatment: 57 Information Obtained from: Patient Chief Complaint here for follow-up on right lateral foot ulcer and left foot ulcers Electronic Signature(s) Signed: 12/14/2016 8:02:19 AM By: [name] MD Entered By: [name] on 12/13/2016 12:44:38 [name] ([id number]) -------------------------------------------------------------------------------- HPI Details Patient Name: [name] Date of Service: 12/13/2016 11:00 AM Medical Record Patient Account Number: [id number] Number: Treating RN: Cornell Barman 1918/12/22 (83 y.o. Other Clinician: Date of Birth/Sex: Female) Treating Mylia Pondexter Primary Care Provider: Lorelee Market Provider/Extender: G Referring Provider: Lorelee Market Weeks in Treatment: 35 History of Present Illness HPI Description: 04/06/16; this is a 83 year old woman who arrives accompanied by 2 daughters for a wound on her left ankle and her left fifth toe. These have apparently been present for a year. I'm not quite certain how she came to this clinic however she was being followed by Sharlotte Alamo her podiatrist for these wounds. She was also referred to [facility] and they apparently did a test presumably arterial studies although we don't have any of these results and we couldn't get through to the office today. The family but has been applying a combination of Bactroban and a light bandage and perhaps more recently Silvadene cream. She did have an x-ray of the  foot roughly 6 months ago at the podiatry office the family was unaware that if there were any abnormalities. Apparently they have not seen any healing here. Our intake nurse noted a slight skin tear on the right anterior lower leg. Nobody seemed aware of this. ABIs calculated in this clinic was 0.3 on the right and 0.4 on the left I have reviewed things [facility]. There is very little information on this patient. She apparently follows in current total clinic which we don't have information from. She has mentioned already been to a [facility]. She has a history of hypothyroidism, nephrolithiasis arthritis and has had a previous mastectomy. 04/13/16; patient's x-ray was normal. She is already been to see Dr. Delana Meyer vascular surgery. Her arterial exam was from November 2016 this showed a left ABI of 0.62 her right of 0.76. Her duplex ultrasound of the left leg showed biphasic waves in the common femoral and distal femoral artery however monophasic waves in the superficial femoral artery proximal and mid biphasic it distal. 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 The patient tells me that she has pain at night when she tries to lie down which is improved by getting up and sitting in the chair this sounds like claudication at rest. Her wounds are on the left medial malleolus and the dorsal fifth toe small punched out wounds that are right on bone. We use Santyl last week 04/20/16: nurse informed me pt has declined evaluation for significant PAD. she denies systemic s/s of infections. 04/27/16;; the patient has had noninvasive arterial studies done in November 2016. ABI and the left was 0.62. Monophasic waves at the superficial femoral artery. Occluded to the posterior tibial artery. So had greater than 50% stenosis of the right superficial femoral and greater than 50% stenosis of the left superficial femoral artery. She had bilateral tibial peroneal artery disease. Both of the wounds on the  left fifth toe and left lateral malleolus have been present for more than a year. They have been to see vein and vascular. The patient has some pain but miraculously I think the wounds have largely been stable. No evidence of infection 05/04/16; she goes for a noninvasive study tomorrow and then sees Dr. Fletcher Anon on Monday. By the time she is here next week we should have a better picture of whether something can be done with regards to her arterial flow. We continue to have ischemic-looking wounds on the left fifth toe and left lateral malleolus. 05/10/16; the patient went for her arterial studies and saw Dr. Fletcher Anon. As predicted she is felt to have critical limb ischemia. The feeling is that she has occlusion of the SFA. The feeling would she would be a [place], [place] ([id number]) candidate for a stent to the SFA. The patient did not make a decision to proceed with the procedure and she is here with family members to discuss this me today. He shouldn't has a lot of pain and cannot sleep and rest well at night per her family. 05/24/16; the patient went and had a complex revascularization/angioplasty of the left superficial femoral artery followed by drug-coated balloon angioplasty and spots stenting. She tolerated the procedure well. She was recommended for dual antiplatelet drugs with Plavix and aspirin for at least a month. She went yesterday for I believe follow-up serial Dopplers and ABIs although I don't see these results. The patient is unfortunately complaining of a lot of pain in her bilateral lower legs below the knees from the ankle to the knees. She apparently was prescribed lidocaine and apparently put this on her legs instead of over the wound areas. This may have something to do with it however there is a lot of edema in her bilateral legs I was able to find her arterial studies from yesterday. The left ABI has improved up to 0.66 post left SFA stent. The bilateral great toe indices  remain abnormal with the left being in the 0.25 range. Duplex ultrasound showed her left SFA stent is patent monophasic waveforms persist in the left leg 06/07/16; continued punched-out areas over the dorsal left fifth toe and left lateral malleolus. No major improvement 06/28/16; the areas over her dorsal left fifth toe and left lateral malleolus are covered in surface slough we are using Iodoflex 07/05/16. We have been using Iodoflex for 2-3 weeks now. I have not been debridement is because of pain 07/19/16 currently we have been using Iodoflex for roughly the past month. Previously we were unable to debride due to pain although today patient states that the pain is not nearly as severe as it has been in the past. In fact she rates this to be a 1 out of 10 and it worse to a to 10 with palpation of the wound. All and all her and her daughter feel like this is actually doing steadily better at this point in time. She is pleased with progress and we have been seeing her every 2 weeks. 08/02/16; this is a delightful 83 year old woman I have not seen in over a month. She has arterial insufficiency wounds remaining over the left lateral malleolus and the left fifth toe. With the help of Dr. Fletcher Anon we are able to get her revascularized on the left. The 2 wounds on the left have been making good progress and are definitely smaller especially the area over the left lateral malleolus. She is certainly in a lot less pain than  she used to be although I still think there is some claudication type pain. Unfortunately she is developed a area on the right lateral foot which is a small open area but I think is threatened. I suspect a small ischemic areas well. Also on her right fifth toe there is an area that is not open but looks as though it is receiving too much pressure from footwear. Finally an area over her right malleolus although I don't think this is on its way to anything ominous 08/17/16 patient presents  today for follow up evaluation she tells me that she is really doing fairly well from a pain standpoint compared to where she has been previous. She is tolerating the dressing changes without any complication. She continues to have discharge and drainage however. 08-30-16 Ms. Kehm presents today with her daughter, she states that she continues to have intermittent shooting pains to the left lateral fifth toe at this site of seems to be a healed ulcer. She denies any other issues that her wound related since her last appointment. Her daughter states that she is in need of a tramadol refill at this time as she uses half a tablet at at bedtime to aid in sleep due to foot pain. Iodoflex has been used on all wounds in previous dressing changes. 09/13/16; the patient has ischemic wounds in her feet. The area over the left lateral malleolus is healed and the area over the left fifth toe looks improved.. She has an area on the lateral aspect of her right foot which is a small but deep wound. Finally she has a new open area on the medial aspect of the left medial malleolus. This is superficial 09/27/16; the area over the left lateral malleolus remains healed. The area over the left fifth toe has a surface and she states the pain is better but I don't think this is completely closed. She has an area on the lateral aspect of her right foot is a small but deep wound. The new open area on the medial aspect of the [facility] ([id number]) left ankle was closed from last time. 10/18/16; the area over her left lateral malleolus remains healed. The area over the left fifth toe has a surface over the top of this however there is no overt open area. Given the underlying issues of severe PAD and continued pain in the toe I would think it would be unlikely this is truly healed however I am not planning to debridement this area. The area on the right lateral foot which was a more recent wound is a small  punched out painful area again has significant surface slough and nonviable tissue. It is clear the patient still has claudication type pain however she remains functional. I have been giving her tramadol when necessary and that seems to help a lot with her pain the patient follows up with Dr. Fletcher Anon on January 18/18 11/01/16; patient missed her follow-up with Dr. Fletcher Anon last week due to a snow day. Follow-up is now on February 15. The areas on the right lateral malleolus and dorsal right fifth toe remain closed over. The fifth toe was tentative is there is a surface eschar however I'm not going to disturb this. Therefore, her only open areas on the right lateral foot. This is a small punched out area. We have been using Prisma. I'm quite convinced this is an ischemic wound 11/15/16; patient has follow-up with Dr. Fletcher Anon on February 15. She has no open wound on  the left leg and doesn't really complain of claudication that I can determine from talking to her. However on the right leg she clearly has some degree of claudication. The remaining open wound is on the right lateral foot. We have been using Iodosorb ointment 11/29/16; patient saw Dr. Fletcher Anon on 11/24/16. His comment is that her wound on her right lateral foot seems to be improving with local wound care. She has known significant right SFA disease. Her lower extremity Doppler will be repeated and according to the patient's daughter that appointment is on March 15. He is left with the thought that endovascular intervention in the right SFA might be necessary. The patient has quite a bit of pain especially at night related to the wound in her right foot. We changed her to Iodosorb ointment last week 12/13/16; this is a patient I follow every 2 weeks largely on a palliative approach at this point about ischemic wounds currently in the right foot. Initially she had them on her left lateral malleolus and left fifth toe. The area on the left lateral  malleolus healed and the fifth toe has a surface eschar that I have elected not to remove. Both of these improved after revascularization by Dr. Fletcher Anon. We have been using Iodosorb to the right lateral foot not much change here. Electronic Signature(s) Signed: 12/14/2016 8:02:19 AM By: [name] MD Entered By: [name] on 12/13/2016 12:46:12 [name] ([id number]) -------------------------------------------------------------------------------- Physical Exam Details Patient Name: [name] Date of Service: 12/13/2016 11:00 AM Medical Record Patient Account Number: [id number] Number: Treating RN: Cornell Barman Apr 22, 1919 (83 y.o. Other Clinician: Date of Birth/Sex: Female) Treating Jamika Sadek Primary Care Provider: Lorelee Market Provider/Extender: G Referring Provider: Lorelee Market Weeks in Treatment: 7 Constitutional Patient is hypertensive.. Pulse regular and within target range for patient.Marland Kitchen Respirations regular, non-labored and within target range.. Temperature is normal and within the target range for the patient.. Patient's appearance is neat and clean. Appears in no acute distress. Well nourished and well developed.. Eyes Conjunctivae clear. No discharge.Marland Kitchen Respiratory Respiratory effort is easy and symmetric bilaterally. Rate is normal at rest and on room air.. Cardiovascular Pedal pulses absent bilaterally.. Lymphatic Nonpalpable in the popliteal or inguinal area. Psychiatric No evidence of depression, anxiety, or agitation. Calm, cooperative, and communicative. Appropriate interactions and affect.. Notes Wound exam; small punched out area on the right lateral foot this is not changed much from last week. oThe area on the left medial malleolus remains closed oShe has a surface eschar over the dorsal aspect of her fifth toe that I have elected to leave intact Electronic Signature(s) Signed: 12/14/2016 8:02:19 AM By: [name] MD Entered By: [name] on 12/13/2016 12:48:53 [name] ([id number]) -------------------------------------------------------------------------------- Physician Orders Details Patient Name: [name] Date of Service: 12/13/2016 11:00 AM Medical Record Patient Account Number: [id number] Number: Treating RN: Cornell Barman 1919/10/06 (83 y.o. Other Clinician: Date of Birth/Sex: Female) Treating Aleka Twitty Primary Care Provider: Lorelee Market Provider/Extender: G Referring Provider: Vinson Moselle in Treatment: 55 Verbal / Phone Orders: No Diagnosis Coding Wound Cleansing Wound #4 Right,Lateral Foot o Clean wound with Normal Saline. - for clinic use o Cleanse wound with mild soap and water o May Shower, gently pat wound dry prior to applying new dressing. Anesthetic Wound #4 Right,Lateral Foot o Topical Lidocaine 4% cream applied to wound bed prior to debridement - for clinic use Skin Barriers/Peri-Wound Care Wound #4 Right,Lateral Foot o Skin Prep Primary Wound Dressing Wound #4 Right,Lateral Foot   o Iodosorb Ointment Secondary Dressing Wound #4 Right,Lateral Foot o Dry Gauze o Boardered Foam Dressing Dressing Change Frequency Wound #4 Right,Lateral Foot o Change dressing every other day. Follow-up Appointments Wound #4 Right,Lateral Foot o Return Appointment in 2 weeks. Edema Control Wound #4 Right,Lateral Foot [place], Melinda ([id number]) o Elevate legs to the level of the heart and pump ankles as often as possible o Other: - compression stockings Additional Orders / Instructions Wound #4 Right,Lateral Foot o Increase protein intake. Medications-please add to medication list. Wound #4 Right,Lateral Foot o Other: - Vitamin C, Zinc, Multivitamins Tramadol Electronic Signature(s) Signed: 12/14/2016 8:02:19 AM By: [name] MD Signed: 12/14/2016 12:55:26 PM By: Gretta Cool RN, BSN, Kim RN,  BSN Entered By: Gretta Cool, RN, BSN, Kim on 12/13/2016 11:39:18 [name] ([id number]) -------------------------------------------------------------------------------- Problem List Details Patient Name: [name] Date of Service: 12/13/2016 11:00 AM Medical Record Patient Account Number: [id number] Number: Treating RN: Cornell Barman 15-Feb-1919 (83 y.o. 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 Other Clinician: Date of Birth/Sex: Female) Treating Josia Cueva, [place] Primary Care Provider: Lorelee Market Provider/Extender: G Referring Provider: Vinson Moselle in Treatment: 35 Subjective Chief Complaint Information obtained from Patient here for follow-up on right lateral foot ulcer and left foot ulcers History of Present Illness (HPI) 04/06/16; this is a 83 year old  woman who arrives accompanied by 2 daughters for a wound on her left ankle and her left fifth toe. These have apparently been present for a year. I'm not quite certain how she came to this clinic however she was being followed by Sharlotte Alamo her podiatrist for these wounds. She was also referred to [facility] and they apparently did a test presumably arterial studies although we don't have any of these results and we couldn't get through to the office today. The family but has been applying a combination of Bactroban and a light bandage and perhaps more recently Silvadene cream. She did have an x-ray of the foot roughly 6 months ago at the podiatry office the family was unaware that if there were any abnormalities. Apparently they have not seen any healing here. Our intake nurse noted a slight skin tear on the right anterior lower leg. Nobody seemed aware of this. ABIs calculated in this clinic was 0.3 on the right and 0.4 on the left I have reviewed things [facility]. There is very little information on this patient. She apparently follows in current total clinic which we don't have information from. She has mentioned already been to a [facility]. She has a history of hypothyroidism, nephrolithiasis arthritis and has had a previous mastectomy. 04/13/16; patient's x-ray was normal. She is already been to see  Dr. Delana Meyer vascular surgery. Her arterial exam was from November 2016 this showed a left ABI of 0.62 her right of 0.76. Her duplex ultrasound of the left leg showed biphasic waves in the common femoral and distal femoral artery however monophasic waves in the superficial femoral artery proximal and mid biphasic it distal. 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 The patient tells me that she has pain at night when she tries to lie down which is improved by getting up and sitting in the chair this sounds like claudication at rest. Her wounds are on the left medial malleolus  and the dorsal fifth toe small punched out wounds that are right on bone. We use Santyl last week 04/20/16: nurse informed me pt has declined evaluation for significant PAD. she denies systemic s/s of infections. 04/27/16;; the patient has had noninvasive arterial studies done in November 2016. ABI and the left was 0.62. Monophasic waves at the superficial femoral artery. Occluded to the posterior tibial artery. So had greater than 50% stenosis of the right superficial femoral and greater than 50% stenosis of the left superficial femoral artery. She had bilateral tibial peroneal artery disease. Both of the wounds on the left fifth toe and left lateral malleolus have been present for more than a year. They have been to see vein and vascular. The patient has some pain but miraculously I think the wounds have largely been stable. No [name], [name] ([id number]) evidence of infection 05/04/16; she goes for a noninvasive study tomorrow and then sees Dr. Fletcher Anon on Monday. By the time she is here next week we should have a better picture of whether something can be done with regards to her arterial flow. We continue to have ischemic-looking wounds on the left fifth toe and left lateral malleolus. 05/10/16; the patient went for her arterial studies and saw Dr. Fletcher Anon. As predicted she is felt to have critical limb ischemia. The feeling is that she has occlusion of the SFA. The feeling would she would be a candidate for a stent to the SFA. The patient did not make a decision to proceed with the procedure and she is here with family members to discuss this me today. He shouldn't has a lot of pain and cannot sleep and rest well at night per her family. 05/24/16; the patient went and had a complex revascularization/angioplasty of the left superficial femoral artery followed by drug-coated balloon angioplasty and spots stenting. She tolerated the procedure well. She was recommended for dual antiplatelet drugs with  Plavix and aspirin for at least a month. She went yesterday for I believe follow-up serial Dopplers and ABIs although I don't see these results. The patient is unfortunately complaining of a lot of pain in her bilateral lower legs below the knees from the ankle to the knees. She apparently was prescribed lidocaine and apparently put this on her legs instead of over the wound areas. This may have something to do with it however there is a lot of edema in her bilateral legs I was able to find her arterial studies from yesterday. The left ABI has improved up to 0.66 post left SFA stent. The bilateral great toe indices remain abnormal with the left being in the 0.25 range. Duplex ultrasound showed her left SFA stent is patent monophasic waveforms persist in the left leg 06/07/16; continued punched-out areas over the dorsal left fifth toe and left lateral malleolus. No major improvement 06/28/16; the areas over her dorsal left fifth toe and left lateral malleolus are  covered in surface slough we are using Iodoflex 07/05/16. We have been using Iodoflex for 2-3 weeks now. I have not been debridement is because of pain 07/19/16 currently we have been using Iodoflex for roughly the past month. Previously we were unable to debride due to pain although today patient states that the pain is not nearly as severe as it has been in the past. In fact she rates this to be a 1 out of 10 and it worse to a to 10 with palpation of the wound. All and all her and her daughter feel like this is actually doing steadily better at this point in time. She is pleased with progress and we have been seeing her every 2 weeks. 08/02/16; this is a delightful 83 year old woman I have not seen in over a month. She has arterial insufficiency wounds remaining over the left lateral malleolus and the left fifth toe. With the help of Dr. Fletcher Anon we are able to get her revascularized on the left. The 2 wounds on the left have been making good  progress and are definitely smaller especially the area over the left lateral malleolus. She is certainly in a lot less pain than she used to be although I still think there is some claudication type pain. Unfortunately she is developed a area on the right lateral foot which is a small open area but I think is threatened. I suspect a small ischemic areas well. Also on her right fifth toe there is an area that is not open but looks as though it is receiving too much pressure from footwear. Finally an area over her right malleolus although I don't think this is on its way to anything ominous 08/17/16 patient presents today for follow up evaluation she tells me that she is really doing fairly well from a pain standpoint compared to where she has been previous. She is tolerating the dressing changes without any complication. She continues to have discharge and drainage however. 08-30-16 Ms. Kirksey presents today with her daughter, she states that she continues to have intermittent shooting pains to the left lateral fifth toe at this site of seems to be a healed ulcer. She denies any other issues that her wound related since her last appointment. Her daughter states that she is in need of a tramadol refill at this time as she uses half a tablet at at bedtime to aid in sleep due to foot pain. Iodoflex has been used on all wounds in previous dressing changes. 09/13/16; the patient has ischemic wounds in her feet. The area over the left lateral malleolus is healed and [name], [name] ([id number]) the area over the left fifth toe looks improved.. She has an area on the lateral aspect of her right foot which is a small but deep wound. Finally she has a new open area on the medial aspect of the left medial malleolus. This is superficial 09/27/16; the area over the left lateral malleolus remains healed. The area over the left fifth toe has a surface and she states the pain is better but I don't think this  is completely closed. She has an area on the lateral aspect of her right foot is a small but deep wound. The new open area on the medial aspect of the left ankle was closed from last time. 10/18/16; the area over her left lateral malleolus remains healed. The area over the left fifth toe has a surface over the top of this however there is no overt open  area. Given the underlying issues of severe PAD and continued pain in the toe I would think it would be unlikely this is truly healed however I am not planning to debridement this area. The area on the right lateral foot which was a more recent wound is a small punched out painful area again has significant surface slough and nonviable tissue. It is clear the patient still has claudication type pain however she remains functional. I have been giving her tramadol when necessary and that seems to help a lot with her pain the patient follows up with Dr. Fletcher Anon on January 18/18 11/01/16; patient missed her follow-up with Dr. Fletcher Anon last week due to a snow day. Follow-up is now on February 15. The areas on the right lateral malleolus and dorsal right fifth toe remain closed over. The fifth toe was tentative is there is a surface eschar however I'm not going to disturb this. Therefore, her only open areas on the right lateral foot. This is a small punched out area. We have been using Prisma. I'm quite convinced this is an ischemic wound 11/15/16; patient has follow-up with Dr. Fletcher Anon on February 15. She has no open wound on the left leg and doesn't really complain of claudication that I can determine from talking to her. However on the right leg she clearly has some degree of claudication. The remaining open wound is on the right lateral foot. We have been using Iodosorb ointment 11/29/16; patient saw Dr. Fletcher Anon on 11/24/16. His comment is that her wound on her right lateral foot seems to be improving with local wound care. She has known significant right SFA  disease. Her lower extremity Doppler will be repeated and according to the patient's daughter that appointment is on March 15. He is left with the thought that endovascular intervention in the right SFA might be necessary. The patient has quite a bit of pain especially at night related to the wound in her right foot. We changed her to Iodosorb ointment last week 12/13/16; this is a patient I follow every 2 weeks largely on a palliative approach at this point about ischemic wounds currently in the right foot. Initially she had them on her left lateral malleolus and left fifth toe. The area on the left lateral malleolus healed and the fifth toe has a surface eschar that I have elected not to remove. Both of these improved after revascularization by Dr. Fletcher Anon. We have been using Iodosorb to the right lateral foot not much change here. Objective Constitutional Patient is hypertensive.. Pulse regular and within target range for patient.Marland Kitchen Respirations regular, non-labored and within target range.. Temperature is normal and within the target range for the patient.. Patient's appearance is neat and clean. Appears in no acute distress. Well nourished and well developed.. Vitals Time Taken: 11:10 AM, Height: 64 in, Weight: 158 lbs, BMI: 27.1, Temperature: 97.9 F, Pulse: 62 bpm, Respiratory Rate: 16 breaths/min, Blood Pressure: 148/58 mmHg. [name], [name] ([id number]) Eyes Conjunctivae clear. No discharge.Marland Kitchen Respiratory Respiratory effort is easy and symmetric bilaterally. Rate is normal at rest and on room air.. Cardiovascular Pedal pulses absent bilaterally.. Lymphatic Nonpalpable in the popliteal or inguinal area. Psychiatric No evidence of depression, anxiety, or agitation. Calm, cooperative, and communicative. Appropriate interactions and affect.. General Notes: Wound exam; small punched out area on the right lateral foot this is not changed much from last week. The area on the left medial  malleolus remains closed She has a surface eschar over the dorsal aspect of her  fifth toe that I have elected to leave intact Integumentary (Hair, Skin) Wound #4 status is Open. Original cause of wound was Gradually Appeared. The wound is located on the Right,Lateral Foot. The wound measures 0.4cm length x 0.3cm width x 0.2cm depth; 0.094cm^2 area and 0.019cm^3 volume. The wound is limited to skin breakdown. There is no tunneling or undermining noted. There is a large amount of serous drainage noted. The wound margin is distinct with the outline attached to the wound base. There is medium (34-66%) pink granulation within the wound bed. There is a medium (34- 66%) amount of necrotic tissue within the wound bed including Adherent Slough. Periwound temperature was noted as No Abnormality. The periwound has tenderness on palpation. 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 - for clinic use Cleanse wound with mild soap and water May Shower, gently pat wound dry prior to applying new dressing. Anesthetic: Wound #4 Right,Lateral Foot: Topical Lidocaine 4% cream applied to wound bed prior to debridement - for clinic use Skin Barriers/Peri-Wound Care: Wound #4 Right,Lateral Foot: Skin Prep Primary Wound Dressing: Wound #4 Right,Lateral Foot: Iodosorb Ointment Secondary Dressing: Wound #4 Right,Lateral Foot: Dry Gauze Boardered Foam Dressing Dressing Change Frequency: Wound #4 Right,Lateral Foot: Change dressing every other day. Follow-up  Appointments: Wound #4 Right,Lateral Foot: Return Appointment in 2 weeks. Edema Control: Wound #4 Right,Lateral Foot: Elevate legs to the level of the heart and pump ankles as often as possible Other: - compression stockings Additional Orders / Instructions: Wound #4 Right,Lateral Foot: Increase protein intake. Medications-please add to medication list.: Wound #4 Right,Lateral Foot: Other: - Vitamin C, Zinc, Multivitamins Tramadol Dr Fletcher Anon next week continue iodosorb ointmnet for now until dr Fletcher Anon elects possible revasularization Electronic Signature(s) [name], [name] ([id number]) Signed: 12/14/2016 8:02:19 AM By: [name] MD Entered By: [name] on 12/13/2016 12:49:47 [name] ([id number]) -------------------------------------------------------------------------------- [place] Details Patient Name: [name] Date of Service: 12/13/2016 Medical Record Patient Account Number: [id number] Number: Treating RN: Cornell Barman [birth year] (83 y.o. 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

## 2016-12-21 ENCOUNTER — Encounter (INDEPENDENT_AMBULATORY_CARE_PROVIDER_SITE_OTHER): Payer: Medicare PPO | Admitting: Ophthalmology

## 2016-12-21 DIAGNOSIS — I10 Essential (primary) hypertension: Secondary | ICD-10-CM

## 2016-12-21 DIAGNOSIS — H35033 Hypertensive retinopathy, bilateral: Secondary | ICD-10-CM | POA: Diagnosis not present

## 2016-12-21 DIAGNOSIS — H43813 Vitreous degeneration, bilateral: Secondary | ICD-10-CM | POA: Diagnosis not present

## 2016-12-21 DIAGNOSIS — H353231 Exudative age-related macular degeneration, bilateral, with active choroidal neovascularization: Secondary | ICD-10-CM | POA: Diagnosis not present

## 2016-12-22 ENCOUNTER — Ambulatory Visit: Payer: Medicare PPO

## 2016-12-22 DIAGNOSIS — I779 Disorder of arteries and arterioles, unspecified: Secondary | ICD-10-CM

## 2016-12-22 DIAGNOSIS — I739 Peripheral vascular disease, unspecified: Secondary | ICD-10-CM

## 2016-12-23 LAB — VAS US LOWER EXTREMITY ARTERIAL DUPLEX
LEFT SFA MID VEL: 0 cm/s
LEFT SFA PROX DYS VEL: 0 cm/s
LPOPDISTDYSV: 0 cm/s
LPOPPROXDYSV: 0 cm/s
LSFADISTDYSV: 0 cm/s
Left popliteal dist sys PSV: -83 cm/s
Left popliteal prox sys PSV: 144 cm/s
Left super femoral dist sys PSV: -280 cm/s
Left super femoral mid sys PSV: -100 cm/s
Left super femoral prox sys PSV: 429 cm/s
RIGHT POPLITEAL DIST EDV: -7 cm/s
RIGHT POPLITEAL PROX EDV: 10 cm/s
RPOPPPSV: 68 cm/s
RSFDPSV: -642 cm/s
RSFMPSV: -249 cm/s
RSFPPSV: -160 cm/s
RTSFAMIDDIA: -21 cm/s
RTSFAPROXDIA: 0 cm/s
Right popliteal dist sys PSV: -35 cm/s

## 2016-12-27 ENCOUNTER — Encounter: Payer: Medicare PPO | Admitting: Internal Medicine

## 2016-12-27 DIAGNOSIS — I70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: Secondary | ICD-10-CM | POA: Diagnosis not present

## 2016-12-28 NOTE — Progress Notes (Signed)
[name], [name] ([id number]) Visit Report for 12/27/2016 Arrival Information Details Patient Name: [name], [name] Date of Service: 12/27/2016 11:00 AM Medical Record Patient Account Number: [id number] Number: Treating RN: Ahmed Prima March 10, 1919 (83 y.o. Other Clinician: Date of Birth/Sex: Female) Treating [name], [name] [place] Primary Care Aris Even: [place] Anden Bartolo/Extender: G Referring Danella Philson: Vinson Moselle in Treatment: 44 Visit Information History Since Last Visit All ordered tests and consults were completed: No Patient Arrived: [name] Added or deleted any medications: No Arrival Time: 11:04 Any new allergies or adverse reactions: No Accompanied By: daughter Had a fall or experienced change in No Transfer Assistance: EasyPivot activities of daily living that may affect Patient Lift risk of falls: Patient Identification Verified: Yes Signs or symptoms of abuse/neglect since last No Secondary Verification Process Yes visito Completed: Hospitalized since last visit: No Patient Requires Transmission- No Has Dressing in Place as Prescribed: Yes Based Precautions: Pain Present Now: No Patient Has Alerts: Yes Electronic Signature(s) Signed: 12/27/2016 4:55:45 PM By: Alric Quan Entered By: Alric Quan on 12/27/2016 11:04:54 [name] ([id number]) -------------------------------------------------------------------------------- Clinic Level of Care Assessment Details Patient Name: [name] Date of Service: 12/27/2016 11:00 AM Medical Record Patient Account Number: [id number] Number: Treating RN: Ahmed Prima [birth year] (83 y.o. Other Clinician: Date of Birth/Sex: Female) Treating [name], [place] Primary Care Kwan Shellhammer: [place]lee [place] Anshi Jalloh/Extender: G Referring Gayatri Teasdale: Vinson Moselle in Treatment: 37 Clinic Level of Care Assessment Items TOOL 4 Quantity Score X - Use when only an  EandM is performed on FOLLOW-UP visit 1 0 ASSESSMENTS - Nursing Assessment / Reassessment X - Reassessment of Co-morbidities (includes updates in patient status) 1 10 X - Reassessment of Adherence to Treatment Plan 1 5 ASSESSMENTS - Wound and Skin Assessment / Reassessment X - Simple Wound Assessment / Reassessment - one wound 1 5 []  - Complex Wound Assessment / Reassessment - multiple wounds 0 []  - Dermatologic / Skin Assessment (not related to wound area) 0 ASSESSMENTS - Focused Assessment []  - Circumferential Edema Measurements - multi extremities 0 []  - Nutritional Assessment / Counseling / Intervention 0 []  - Lower Extremity Assessment (monofilament, tuning fork, pulses) 0 []  - Peripheral Arterial Disease Assessment (using hand held doppler) 0 ASSESSMENTS - Ostomy and/or Continence Assessment and Care []  - Incontinence Assessment and Management 0 []  - Ostomy Care Assessment and Management (repouching, etc.) 0 PROCESS - Coordination of Care X - Simple Patient / Family Education for ongoing care 1 15 []  - Complex (extensive) Patient / Family Education for ongoing care 0 []  - Staff obtains Programmer, systems, Records, Test Results / Process Orders 0 []  - Staff telephones HHA, Nursing Homes / Clarify orders / etc 0 [name], [name] ([id number]) []  - Routine Transfer to another Facility (non-emergent condition) 0 []  - Routine Hospital Admission (non-emergent condition) 0 []  - New Admissions / Biomedical engineer / Ordering NPWT, Apligraf, etc. 0 []  - Emergency Hospital Admission (emergent condition) 0 X - Simple Discharge Coordination 1 10 []  - Complex (extensive) Discharge Coordination 0 PROCESS - Special Needs []  - Pediatric / Minor Patient Management 0 []  - Isolation Patient Management 0 []  - Hearing / Language / Visual special needs 0 []  - Assessment of Community assistance (transportation, D/C planning, etc.) 0 []  - Additional assistance / Altered mentation 0 []  - Support Surface(s)  Assessment (bed, cushion, seat, etc.) 0 INTERVENTIONS - Wound Cleansing / Measurement X - Simple Wound Cleansing - one wound 1 5 []  - Complex Wound Cleansing - multiple wounds 0 X - Wound Imaging (photographs -  any number of wounds) 1 5 []  - Wound Tracing (instead of photographs) 0 X - Simple Wound Measurement - one wound 1 5 []  - Complex Wound Measurement - multiple wounds 0 INTERVENTIONS - Wound Dressings X - Small Wound Dressing one or multiple wounds 1 10 []  - Medium Wound Dressing one or multiple wounds 0 []  - Large Wound Dressing one or multiple wounds 0 X - Application of Medications - topical 1 5 []  - Application of Medications - injection 0 [name] ([id number]) INTERVENTIONS - Miscellaneous []  - External ear exam 0 []  - Specimen Collection (cultures, biopsies, blood, body fluids, etc.) 0 []  - Specimen(s) / Culture(s) sent or taken to Lab for analysis 0 []  - Patient Transfer (multiple staff / Harrel Lemon Lift / Similar devices) 0 []  - Simple Staple / Suture removal (25 or less) 0 []  - Complex Staple / Suture removal (26 or more) 0 []  - Hypo / Hyperglycemic Management (close monitor of Blood Glucose) 0 []  - Ankle / Brachial Index (ABI) - do not check if billed separately 0 X - Vital Signs 1 5 Has the patient been seen at the hospital within the last three years: Yes Total Score: 80 Level Of Care: New/Established - Level 3 Electronic Signature(s) Signed: 12/27/2016 4:55:45 PM By: Alric Quan Entered By: Alric Quan on 12/27/2016 15:11:19 [name] ([id number]) -------------------------------------------------------------------------------- Encounter Discharge Information Details Patient Name: [name] Date of Service: 12/27/2016 11:00 AM Medical Record Patient Account Number: [id number] Number: Treating RN: Ahmed Prima [birth year] (83 y.o. Other Clinician: Date of Birth/Sex: Female) Treating [name], [place] Primary Care Marlaya Turck:  [place]lee [place] Haliyah Fryman/Extender: G Referring Ryelan Kazee: Vinson Moselle in Treatment: 37 Encounter Discharge Information Items Discharge Pain Level: 0 Discharge Condition: Stable Ambulatory Status: Walker Discharge Destination: Home Transportation: Private Auto Accompanied By: daughter Schedule Follow-up Appointment: Yes Medication Reconciliation completed No and provided to Patient/Care Victorino Fatzinger: Provided on Clinical Summary of Care: 12/27/2016 Form Type Recipient Paper Patient [facility] Electronic Signature(s) Signed: 12/27/2016 11:30:44 AM By: Ruthine Dose Entered By: Ruthine Dose on 12/27/2016 11:30:43 [name] ([id number]) -------------------------------------------------------------------------------- Lower Extremity Assessment Details Patient Name: [name] Date of Service: 12/27/2016 11:00 AM Medical Record Patient Account Number: [id number] Number: Treating RN: Ahmed Prima 01/08/1919 (83 y.o. Other Clinician: Date of Birth/Sex: Female) Treating [name], MICHAEL Primary Care Larinda Herter: [place]lee [place] Welcome Fults/Extender: G Referring Ayvin Lipinski: Vinson Moselle in Treatment: 37 Vascular Assessment Pulses: Dorsalis Pedis Palpable: [Right:Yes] Posterior Tibial Extremity colors, hair growth, and conditions: Extremity Color: [Right:Hyperpigmented] Temperature of Extremity: [Right:Warm] Capillary Refill: [Right:< 3 seconds] Toe Nail Assessment Left: Right: Thick: No Discolored: No Deformed: No Improper Length and Hygiene: No Electronic Signature(s) Signed: 12/27/2016 4:55:45 PM By: Alric Quan Entered By: Alric Quan on 12/27/2016 11:11:04 [name] ([id number]) -------------------------------------------------------------------------------- Multi Wound Chart Details Patient Name: [name] Date of Service: 12/27/2016 11:00 AM Medical Record Patient Account Number:  [id number] Number: Treating RN: Ahmed Prima [birth year] (83 y.o. 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 N/A] Comorbid History: [4:Cataracts, Hypertension, N/A Osteoarthritis] Date Acquired: [4:07/26/2016] [N/A:N/A] Weeks of Treatment: [4:21] [N/A:N/A] Wound Status: [4:Open] [N/A:N/A] Measurements L x W x D 0.3x0.4x0.2 [N/A:N/A] (cm) Area (cm) : [  4:0.094] [N/A:N/A] Volume (cm) : [4:0.019] [N/A:N/A] % Reduction in Area: [4:-100.00%] [N/A:N/A] % Reduction in Volume: -280.00% [N/A:N/A] Classification: [4:Partial Thickness] [N/A:N/A] Exudate Amount: [4:Large] [N/A:N/A] Exudate Type: [4:Serous] [N/A:N/A] Exudate Color: [4:amber] [N/A:N/A] Wound Margin: [4:Distinct, outline attached N/A] Granulation Amount: [4:Medium (34-66%)] [N/A:N/A] Granulation Quality: [4:Pink] [N/A:N/A] Necrotic Amount: [4:Medium (34-66%)] [N/A:N/A] Exposed Structures: [4:Fascia: No Fat Layer (Subcutaneous Tissue) Exposed: No Tendon: No Muscle: No] [N/A:N/A] Joint: No Bone: No Limited to Skin Breakdown Epithelialization: None N/A N/A Periwound Skin Texture: No Abnormalities Noted N/A N/A Periwound Skin No Abnormalities Noted N/A N/A Moisture: Periwound Skin Color: No Abnormalities Noted N/A N/A Temperature: No Abnormality N/A N/A Tenderness on Yes N/A N/A Palpation: Wound Preparation: Ulcer Cleansing: N/A N/A Rinsed/Irrigated with Saline Topical Anesthetic Applied: Other:  lidocaine 4% Treatment Notes Wound #4 (Right, Lateral Foot) 1. Cleansed with: Clean wound with Normal Saline 2. Anesthetic Topical Lidocaine 4% cream to wound bed prior to debridement 3. Peri-wound Care: Skin Prep 4. Dressing Applied: Iodosorb Ointment 5. Secondary Dressing Applied Bordered Foam Dressing Dry Gauze Notes iodosorb Electronic Signature(s) Signed: 12/27/2016 4:28:03 PM By: [name] MD Entered By: [name] on 12/27/2016 11:31:56 [name] ([id number]) -------------------------------------------------------------------------------- Multi-Disciplinary Care Plan Details Patient Name: [name] Date of Service: 12/27/2016 11:00 AM Medical Record Patient Account Number: [id number] Number: Treating RN: Ahmed Prima 01/07/1919 (83 y.o. Other Clinician: Date of Birth/Sex: Female) Treating [name], [place] Primary Care Sariyah Corcino: [place] Bertrice Leder/Extender: G Referring Crystallee Werden: Vinson Moselle in Treatment: 37 Active Inactive ` Abuse / Safety / Falls / Self Care Management Nursing Diagnoses: Potential for falls Goals: Patient will remain injury free Date Initiated: 04/06/2016 Target Resolution Date: 12/22/2016 Goal Status: Active Interventions: Assess fall risk on admission and as needed Notes: ` Nutrition Nursing Diagnoses: Imbalanced nutrition Goals: Patient/caregiver agrees to and verbalizes understanding of need to use nutritional supplements and/or vitamins as prescribed Date Initiated: 04/06/2016 Target Resolution Date: 12/22/2016 Goal Status: Active Interventions: Assess patient nutrition upon admission and as needed per policy Notes: ` Orientation to the [place], [place] ([id number]) Nursing Diagnoses: Knowledge deficit related to the wound healing center program Goals: Patient/caregiver will verbalize understanding of the [place] Date Initiated:  04/06/2016 Target Resolution Date: 12/22/2016 Goal Status: Active Interventions: Provide education on orientation to the wound center Notes: ` Pain, Acute or Chronic Nursing Diagnoses: Pain, acute or chronic: actual or potential Potential alteration in comfort, pain Goals: Patient will verbalize adequate pain control and receive pain control interventions during procedures as needed Date Initiated: 04/06/2016 Target Resolution Date: 12/22/2016 Goal Status: Active Interventions: Assess comfort goal upon admission Complete pain assessment as per visit requirements Notes: ` Wound/Skin Impairment Nursing Diagnoses: Impaired tissue integrity Goals: Ulcer/skin breakdown will have a volume reduction of 30% by week 4 Date Initiated: 04/06/2016 Target Resolution Date: 12/22/2016 Goal Status: Active Ulcer/skin breakdown will have a volume reduction of 50% by week 8 Date Initiated: 04/06/2016 Target Resolution Date: 12/22/2016 Goal Status: Active Ulcer/skin breakdown will have a volume reduction of 80% by week 12 KIPPY, MELENA ([id number]) Date Initiated: 04/06/2016 Target Resolution Date: 12/22/2016 Goal Status: Active Interventions: Assess patient/caregiver ability to obtain necessary supplies Assess ulceration(s) every visit Notes: Electronic Signature(s) Signed: 12/27/2016 4:55:45 PM By: Alric Quan Entered By: Alric Quan on 12/27/2016 11:11:10 [name] ([id number]) -------------------------------------------------------------------------------- Pain Assessment Details Patient Name: [name] Date of Service: 12/27/2016 11:00 AM Medical Record Patient Account Number: [id number] Number: Treating RN: Ahmed Prima [birth year] (83 y.o. Other Clinician: Date of Birth/Sex: Female)  Treating [name] Primary Care Mariem Skolnick: [place]lee [place] Saw Mendenhall/Extender: G Referring Samari Bittinger: Vinson Moselle in Treatment: 37 Active  Problems Location of Pain Severity and Description of Pain Patient Has Paino No Site Locations With Dressing Change: No Pain Management and Medication Current Pain Management: Electronic Signature(s) Signed: 12/27/2016 4:55:45 PM By: Alric Quan Entered By: Alric Quan on 12/27/2016 11:05:02 [name] ([id number]) -------------------------------------------------------------------------------- Patient/Caregiver Education Details Patient Name: [name] Date of Service: 12/27/2016 11:00 AM Medical Record Patient Account Number: [id number] Number: Treating RN: Ahmed Prima 1919-06-03 (83 y.o. Other Clinician: Date of Birth/Gender: Female) Treating [name], [place] Primary Care Physician: Lorelee [place] Physician/Extender: G Referring Physician: Vinson Moselle in Treatment: 36 Education Assessment Education Provided To: Patient Education Topics Provided Wound/Skin Impairment: Handouts: Other: change dressings as directed Methods: Demonstration, Explain/Verbal Responses: State content correctly Electronic Signature(s) Signed: 12/27/2016 4:55:45 PM By: Alric Quan Entered By: Alric Quan on 12/27/2016 11:16:03 [name] ([id number]) -------------------------------------------------------------------------------- Wound Assessment Details Patient Name: [name] Date of Service: 12/27/2016 11:00 AM Medical Record Patient Account Number: [id number] Number: Treating RN: Ahmed Prima 1919/08/16 (83 y.o. Other Clinician: Date of Birth/Sex: Female) Treating [name], [place] Primary Care Shivangi Lutz: [place] Debora Stockdale/Extender: G Referring Latresha Yahr: [place]lee [place] Weeks in Treatment: 79 Wound Status Wound Number: 4 Primary Arterial Insufficiency Ulcer Etiology: Wound Location: Right Foot - Lateral Wound Status: Open Wounding Event: Gradually Appeared Comorbid Cataracts,  Hypertension, Date Acquired: 07/26/2016 History: Osteoarthritis Weeks Of Treatment: 21 Clustered Wound: No Photos Photo Uploaded By: Alric Quan on 12/27/2016 16:36:42 Wound Measurements Length: (cm) 0.3 Width: (cm) 0.4 Depth: (cm) 0.2 Area: (cm) 0.094 Volume: (cm) 0.019 % Reduction in Area: -100% % Reduction in Volume: -280% Epithelialization: None Tunneling: No Undermining: No Wound Description Classification: Partial Thickness Wound Margin: Distinct, outline attached Exudate Amount: Large Exudate Type: Serous Exudate Color: amber Foul Odor After Cleansing: No Wound Bed Granulation Amount: Medium (34-66%) Exposed Structure Granulation Quality: Pink Fascia Exposed: No Necrotic Amount: Medium (34-66%) Fat Layer (Subcutaneous Tissue) Exposed: No Heinzelman, Shenicka ([id number]) Necrotic Quality: Adherent Slough Tendon Exposed: No Muscle Exposed: No Joint Exposed: No Bone Exposed: No Limited to Skin Breakdown Periwound Skin Texture Texture Color No Abnormalities Noted: No No Abnormalities Noted: No Moisture Temperature / Pain No Abnormalities Noted: No Temperature: No Abnormality Tenderness on Palpation: Yes Wound Preparation Ulcer Cleansing: Rinsed/Irrigated with Saline Topical Anesthetic Applied: Other: lidocaine 4%, Treatment Notes Wound #4 (Right, Lateral Foot) 1. Cleansed with: Clean wound with Normal Saline 2. Anesthetic Topical Lidocaine 4% cream to wound bed prior to debridement 3. Peri-wound Care: Skin Prep 4. Dressing Applied: Iodosorb Ointment 5. Secondary Dressing Applied Bordered Foam Dressing Dry Gauze Notes iodosorb Electronic Signature(s) Signed: 12/27/2016 4:55:45 PM By: Alric Quan Entered By: Alric Quan on 12/27/2016 11:10:08 [name] ([id number]) -------------------------------------------------------------------------------- Vitals Details Patient Name: [name] Date of Service: 12/27/2016 11:00  AM Medical Record Patient Account Number: [id number] Number: Treating RN: Ahmed Prima 1918/11/02 (83 y.o. Other Clinician: Date of Birth/Sex: Female) Treating [name], MICHAEL Primary Care Jemina Scahill: [place] Khamora Karan/Extender: G Referring Jaquon Gingerich: [place] Weeks in Treatment: 37 Vital Signs Time Taken: 11:05 Temperature (F): 98.2 Height (in): 64 Pulse (bpm): 72 Weight (lbs): 158 Respiratory Rate (breaths/min): 16 Body Mass Index (BMI): 27.1 Blood Pressure (mmHg): 141/55 Reference Range: 80 - 120 mg / dl Electronic Signature(s) Signed: 12/27/2016 4:55:45 PM By: Alric Quan Entered By: Alric Quan on 12/27/2016 11:06:39

## 2016-12-28 NOTE — Progress Notes (Signed)
[name], [name] ([id number]) Visit Report for 12/27/2016 Chief Complaint Document Details Patient Name: [name] Date of Service: 12/27/2016 11:00 AM Medical Record Patient Account Number: [id number] Number: Treating RN: [name] July 26, 1919 (83 y.o. Other Clinician: Date of Birth/Sex: Female) Treating [name] Primary Care Provider: Lorelee [name] Provider/Extender: [name] Referring Provider: Vinson [name] in Treatment: 37 Information Obtained from: Patient Chief Complaint here for follow-up on right lateral foot ulcer and left foot ulcers Electronic Signature(s) Signed: 12/27/2016 4:28:03 PM By: [name] Ham MD Entered By: [name] on 12/27/2016 11:32:24 [name] ([id number]) -------------------------------------------------------------------------------- HPI Details Patient Name: [name] Date of Service: 12/27/2016 11:00 AM Medical Record Patient Account Number: [id number] Number: Treating RN: [name] 06/22/1919 (83 y.o. Other Clinician: Date of Birth/Sex: Female) Treating [name] Primary Care Provider: Lorelee [name] Provider/Extender: [name] Referring Provider: Lorelee [name] Weeks in Treatment: 37 History of Present Illness HPI Description: 04/06/16; this is a 83 year old woman who arrives accompanied by 2 daughters for a wound on her left ankle and her left fifth toe. These have apparently been present for a year. I'm not quite certain how she came to this clinic however she was being followed by [name] her podiatrist for these wounds. She was also referred to [facility] and they apparently did a test presumably arterial studies although we don't have any of these results and we couldn't get through to the office today. The family but has been applying a combination of Bactroban and a light bandage and perhaps more recently Silvadene cream. She did have an  x-ray of the foot roughly 6 months ago at the podiatry office the family was unaware that if there were any abnormalities. Apparently they have not seen any healing here. Our intake nurse noted a slight skin tear on the right anterior lower leg. Nobody seemed aware of this. ABIs calculated in this clinic was 0.3 on the right and 0.4 on the left I have reviewed things [facility]. There is very little information on this patient. She apparently follows in current total clinic which we don't have information from. She has mentioned already been to a [facility]. She has a history of hypothyroidism, nephrolithiasis arthritis and has had a previous mastectomy. 04/13/16; patient's x-ray was normal. She is already been to see Dr. Delana [name] vascular surgery. Her arterial exam was from November 2016 this showed a left ABI of 0.62 her right of 0.76. Her duplex ultrasound of the left leg showed biphasic waves in the common femoral and distal femoral artery however monophasic waves in the superficial femoral artery proximal and mid biphasic it distal. 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 The patient tells me that she has pain at night when she tries to lie down which is improved by getting up and sitting in the chair this sounds like claudication at rest. Her wounds are on the left medial malleolus and the dorsal fifth toe small punched out wounds that are right on bone. We use Santyl last week 04/20/16: nurse informed me pt has declined evaluation for significant PAD. she denies systemic s/s of infections. 04/27/16;; the patient has had noninvasive arterial studies done in November 2016. ABI and the left was 0.62. Monophasic waves at the superficial femoral artery. Occluded to the posterior tibial artery. So had greater than 50% stenosis of the right superficial femoral and greater than 50% stenosis of the left superficial femoral artery. She had bilateral tibial peroneal artery disease. Both of the wounds  on the  left fifth toe and left lateral malleolus have been present for more than a year. They have been to see vein and vascular. The patient has some pain but miraculously I think the wounds have largely been stable. No evidence of infection 05/04/16; she goes for a noninvasive study tomorrow and then sees Dr. Fletcher [name] on Monday. By the time she is here next week we should have a better picture of whether something can be done with regards to her arterial flow. We continue to have ischemic-looking wounds on the left fifth toe and left lateral malleolus. 05/10/16; the patient went for her arterial studies and saw Dr. Fletcher [name]. As predicted she is felt to have critical limb ischemia. The feeling is that she has occlusion of the SFA. The feeling would she would be a [name] ([id number]) candidate for a stent to the SFA. The patient did not make a decision to proceed with the procedure and she is here with family members to discuss this me today. He shouldn't has a lot of pain and cannot sleep and rest well at night per her family. 05/24/16; the patient went and had a complex revascularization/angioplasty of the left superficial femoral artery followed by drug-coated balloon angioplasty and spots stenting. She tolerated the procedure well. She was recommended for dual antiplatelet drugs with Plavix and aspirin for at least a month. She went yesterday for I believe follow-up serial Dopplers and ABIs although I don't see these results. The patient is unfortunately complaining of a lot of pain in her bilateral lower legs below the knees from the ankle to the knees. She apparently was prescribed lidocaine and apparently put this on her legs instead of over the wound areas. This may have something to do with it however there is a lot of edema in her bilateral legs I was able to find her arterial studies from yesterday. The left ABI has improved up to 0.66 post left SFA stent. The bilateral great toe  indices remain abnormal with the left being in the 0.25 range. Duplex ultrasound showed her left SFA stent is patent monophasic waveforms persist in the left leg 06/07/16; continued punched-out areas over the dorsal left fifth toe and left lateral malleolus. No major improvement 06/28/16; the areas over her dorsal left fifth toe and left lateral malleolus are covered in surface slough we are using Iodoflex 07/05/16. We have been using Iodoflex for 2-3 weeks now. I have not been debridement is because of pain 07/19/16 currently we have been using Iodoflex for roughly the past month. Previously we were unable to debride due to pain although today patient states that the pain is not nearly as severe as it has been in the past. In fact she rates this to be a 1 out of 10 and it worse to a to 10 with palpation of the wound. All and all her and her daughter feel like this is actually doing steadily better at this point in time. She is pleased with progress and we have been seeing her every 2 weeks. 08/02/16; this is a delightful 83 year old woman I have not seen in over a month. She has arterial insufficiency wounds remaining over the left lateral malleolus and the left fifth toe. With the help of Dr. Fletcher [name] we are able to get her revascularized on the left. The 2 wounds on the left have been making good progress and are definitely smaller especially the area over the left lateral malleolus. She is certainly in a lot less pain than  she used to be although I still think there is some claudication type pain. Unfortunately she is developed a area on the right lateral foot which is a small open area but I think is threatened. I suspect a small ischemic areas well. Also on her right fifth toe there is an area that is not open but looks as though it is receiving too much pressure from footwear. Finally an area over her right malleolus although I don't think this is on its way to anything ominous 08/17/16 patient  presents today for follow up evaluation she tells me that she is really doing fairly well from a pain standpoint compared to where she has been previous. She is tolerating the dressing changes without any complication. She continues to have discharge and drainage however. 08-30-16 Ms. Runions presents today with her daughter, she states that she continues to have intermittent shooting pains to the left lateral fifth toe at this site of seems to be a healed ulcer. She denies any other issues that her wound related since her last appointment. Her daughter states that she is in need of a tramadol refill at this time as she uses half a tablet at at bedtime to aid in sleep due to foot pain. Iodoflex has been used on all wounds in previous dressing changes. 09/13/16; the patient has ischemic wounds in her feet. The area over the left lateral malleolus is healed and the area over the left fifth toe looks improved.. She has an area on the lateral aspect of her right foot which is a small but deep wound. Finally she has a [name] open area on the medial aspect of the left medial malleolus. This is superficial 09/27/16; the area over the left lateral malleolus remains healed. The area over the left fifth toe has a surface and she states the pain is better but I don't think this is completely closed. She has an area on the lateral aspect of her right foot is a small but deep wound. The [name] open area on the medial aspect of the [name], [name] ([id number]) left ankle was closed from last time. 10/18/16; the area over her left lateral malleolus remains healed. The area over the left fifth toe has a surface over the top of this however there is no overt open area. Given the underlying issues of severe PAD and continued pain in the toe I would think it would be unlikely this is truly healed however I am not planning to debridement this area. The area on the right lateral foot which was a more recent wound is a small  punched out painful area again has significant surface slough and nonviable tissue. It is clear the patient still has claudication type pain however she remains functional. I have been giving her tramadol when necessary and that seems to help a lot with her pain the patient follows up with Dr. Fletcher [name] on January 18/18 11/01/16; patient missed her follow-up with Dr. Fletcher [name] last week due to a snow day. Follow-up is now on February 15. The areas on the right lateral malleolus and dorsal right fifth toe remain closed over. The fifth toe was tentative is there is a surface eschar however I'm not going to disturb this. Therefore, her only open areas on the right lateral foot. This is a small punched out area. We have been using Prisma. I'm quite convinced this is an ischemic wound 11/15/16; patient has follow-up with Dr. Fletcher [name] on February 15. She has no open wound on  the left leg and doesn't really complain of claudication that I can determine from talking to her. However on the right leg she clearly has some degree of claudication. The remaining open wound is on the right lateral foot. We have been using Iodosorb ointment 11/29/16; patient saw Dr. Fletcher [name] on 11/24/16. His comment is that her wound on her right lateral foot seems to be improving with local wound care. She has known significant right SFA disease. Her lower extremity Doppler will be repeated and according to the patient's daughter that appointment is on March 15. He is left with the thought that endovascular intervention in the right SFA might be necessary. The patient has quite a bit of pain especially at night related to the wound in her right foot. We changed her to Iodosorb ointment last week 12/13/16; this is a patient I follow every 2 weeks largely on a palliative approach at this point about ischemic wounds currently in the right foot. Initially she had them on her left lateral malleolus and left fifth toe. The area on the left lateral  malleolus healed and the fifth toe has a surface eschar that I have elected not to remove. Both of these improved after revascularization by Dr. Fletcher [name]. We have been using Iodosorb to the right lateral foot not much change here. 12/27/16; the patient had her arterial studies. This showed known bilateral SFA disease. Stable right ABI 0.5 to stable left ABI at 0.7 to the did not do it TBI on the right it was 0.61 on the left she has a stent in the long segment of her left SFA from 05/18/16. All of her wounds are somewhat better. She states her pain is better in the right foot is improved. Electronic Signature(s) Signed: 12/27/2016 4:28:03 PM By: [name] Ham MD Entered By: [name] on 12/27/2016 12:00:36 [name] ([id number]) -------------------------------------------------------------------------------- Physical Exam Details Patient Name: [name] Date of Service: 12/27/2016 11:00 AM Medical Record Patient Account Number: [id number] Number: Treating RN: [name] 10-Sep-1919 (83 y.o. Other Clinician: Date of Birth/Sex: Female) Treating Jessee Mezera Primary Care Provider: Lorelee [name] Provider/Extender: [name] Referring Provider: Lorelee [name] Weeks in Treatment: 37 Constitutional Sitting or standing Blood Pressure is within target range for patient.. Pulse regular and within target range for patient.Marland Kitchen Respirations regular, non-labored and within target range.. Temperature is normal and within the target range for the patient.. Patient's appearance is neat and clean. Appears in no acute distress. Well nourished and well developed.. Eyes Conjunctivae clear. No discharge.Marland Kitchen Respiratory Respiratory effort is easy and symmetric bilaterally. Rate is normal at rest and on room air.. Cardiovascular Pedal pulses absent bilaterally.. Lymphatic None palpable in the right popliteal or inguinal area. Psychiatric No evidence of depression, anxiety, or  agitation. Calm, cooperative, and communicative. Appropriate interactions and affect.. Notes Exam; this multi-edged area area on the right lateral foot looks somewhat better and it looks like it's contracting, I therefore am going to continue Iodosorb. The area on the left medial malleolus and dorsal aspect of her left fifth toe which were the original wounds remain closed over. There is no evidence of infection in any area Electronic Signature(s) Signed: 12/27/2016 4:28:03 PM By: [name] Ham MD Entered By: [name] on 12/27/2016 12:03:07 [name] ([id number]) -------------------------------------------------------------------------------- Physician Orders Details Patient Name: [name] Date of Service: 12/27/2016 11:00 AM Medical Record Patient Account Number: [id number] Number: Treating RN: [name] [birth year] (83 y.o. Other Clinician: Date of Birth/Sex: Female) Treating Reniyah Gootee, [place] Primary Care Provider: Brunetta Genera,  [place] Provider/Extender: [name] Referring Provider: Vinson [name] in Treatment: 67 Verbal / Phone Orders: Yes Clinician: Carolyne Fiscal, Debi Read Back and Verified: Yes Diagnosis Coding Wound Cleansing Wound #4 Right,Lateral Foot o Clean wound with Normal Saline. - for clinic use o Cleanse wound with mild soap and water o May Shower, gently pat wound dry prior to applying [name] dressing. Anesthetic Wound #4 Right,Lateral Foot o Topical Lidocaine 4% cream applied to wound bed prior to debridement - for clinic use Skin Barriers/Peri-Wound Care Wound #4 Right,Lateral Foot o Skin Prep Primary Wound Dressing Wound #4 Right,Lateral Foot o Iodosorb Ointment Secondary Dressing Wound #4 Right,Lateral Foot o Dry Gauze o Boardered Foam Dressing Dressing Change Frequency Wound #4 Right,Lateral Foot o Change dressing every other day. Follow-up Appointments Wound #4 Right,Lateral Foot o Return  Appointment in 2 weeks. Edema Control Wound #4 Right,Lateral Foot Weisinger, Stephanieann ([id number]) o Elevate legs to the level of the heart and pump ankles as often as possible o Other: - compression stockings Additional Orders / Instructions Wound #4 Right,Lateral Foot o Increase protein intake. Medications-please add to medication list. Wound #4 Right,Lateral Foot o Other: - Vitamin C, Zinc, Multivitamins Tramadol Electronic Signature(s) Signed: 12/27/2016 4:28:03 PM By: [name] Ham MD Signed: 12/27/2016 4:55:45 PM By: Alric Quan Entered By: Alric Quan on 12/27/2016 11:22:24 [name] ([id number]) -------------------------------------------------------------------------------- Problem List Details Patient Name: [name] Date of Service: 12/27/2016 11:00 AM Medical Record Patient Account Number: [id number] Number: Treating RN: [name] 03/29/1919 (83 y.o. 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 Other Clinician: Date of Birth/Sex: Female) Treating Bethzaida Boord, [place] Primary Care Provider: Lorelee [name] Provider/Extender: [name] Referring Provider: Vinson [name] in Treatment: 37 Subjective Chief Complaint Information obtained from Patient here for follow-up on right lateral foot ulcer and left foot ulcers History of Present Illness (HPI) 04/06/16; this is a 83 year old woman who arrives accompanied by 2 daughters for a wound on her left ankle and her left fifth toe. These have apparently been present for a year. I'm not quite certain how she came to this clinic however she was being followed by [name] her podiatrist for these wounds. She was also referred to [facility] and they apparently did a test presumably arterial studies although we don't have any of these results and we couldn't get through to the office today. The family but has been applying a combination of Bactroban and a light bandage and perhaps more recently Silvadene cream. She did have an x-ray of the foot roughly 6 months ago at the podiatry office the family was unaware that if there were any abnormalities. Apparently they have not seen any healing here. Our  intake nurse noted a slight skin tear on the right anterior lower leg. Nobody seemed aware of this. ABIs calculated in this clinic was 0.3 on the right and 0.4 on the left I have reviewed things [facility]. There is very little information on this patient. She apparently follows in current total clinic which we don't have information from. She has mentioned already been to a [facility]. She has a history of hypothyroidism, nephrolithiasis arthritis and has had a previous mastectomy. 04/13/16; patient's x-ray was normal. She is already been to see Dr. Delana [name]  vascular surgery. Her arterial exam was from November 2016 this showed a left ABI of 0.62 her right of 0.76. Her duplex ultrasound of the left leg showed biphasic waves in the common femoral and distal femoral artery however monophasic waves in the superficial femoral artery proximal and mid biphasic it distal. 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 The patient tells me that she has pain at night when she tries to lie down which is improved by getting up and sitting in the chair this sounds like claudication at rest. Her wounds are on the left medial malleolus and the dorsal fifth toe small punched out wounds that are right on bone. We use Santyl last week 04/20/16: nurse informed me pt has declined evaluation for significant PAD. she denies systemic s/s of infections. 04/27/16;; the patient has had noninvasive arterial studies done in November 2016. ABI and the left was 0.62. Monophasic waves at the superficial femoral artery. Occluded to the posterior tibial artery. So had greater than 50% stenosis of the right superficial femoral and greater than 50% stenosis of the left superficial femoral artery. She had bilateral tibial peroneal artery disease. Both of the wounds on the left fifth toe and left lateral malleolus have been present for more than a year. They have been to see vein and vascular. The patient has some pain but miraculously I think the wounds have largely been stable. No [name], [name] ([id number]) evidence of infection 05/04/16; she goes for a noninvasive study tomorrow and then sees Dr. Fletcher [name] on Monday. By the time she is here next week we should have a better picture of whether something can be done with regards to her arterial flow. We continue to have ischemic-looking wounds on the left fifth toe and left lateral malleolus. 05/10/16; the patient went for her arterial studies and saw Dr. Fletcher [name]. As predicted she is felt to have critical limb ischemia. The feeling is that she  has occlusion of the SFA. The feeling would she would be a candidate for a stent to the SFA. The patient did not make a decision to proceed with the procedure and she is here with family members to discuss this me today. He shouldn't has a lot of pain and cannot sleep and rest well at night per her family. 05/24/16; the patient went and had a complex revascularization/angioplasty of the left superficial femoral artery followed by drug-coated balloon angioplasty and spots stenting. She tolerated the procedure well. She was recommended for dual antiplatelet drugs with Plavix and aspirin for at least a month. She went yesterday for I believe follow-up serial Dopplers and ABIs although I don't see these results. The patient is unfortunately complaining of a lot of pain in her bilateral lower legs below the knees from the ankle to the knees. She apparently was prescribed lidocaine and apparently put this on her legs instead of over the wound areas. This may have something to do with it however  there is a lot of edema in her bilateral legs I was able to find her arterial studies from yesterday. The left ABI has improved up to 0.66 post left SFA stent. The bilateral great toe indices remain abnormal with the left being in the 0.25 range. Duplex ultrasound showed her left SFA stent is patent monophasic waveforms persist in the left leg 06/07/16; continued punched-out areas over the dorsal left fifth toe and left lateral malleolus. No major improvement 06/28/16; the areas over her dorsal left fifth toe and left lateral malleolus are covered in surface slough we are using Iodoflex 07/05/16. We have been using Iodoflex for 2-3 weeks now. I have not been debridement is because of pain 07/19/16 currently we have been using Iodoflex for roughly the past month. Previously we were unable to debride due to pain although today patient states that the pain is not nearly as severe as it has been in the past. In fact  she rates this to be a 1 out of 10 and it worse to a to 10 with palpation of the wound. All and all her and her daughter feel like this is actually doing steadily better at this point in time. She is pleased with progress and we have been seeing her every 2 weeks. 08/02/16; this is a delightful 83 year old woman I have not seen in over a month. She has arterial insufficiency wounds remaining over the left lateral malleolus and the left fifth toe. With the help of Dr. Fletcher [name] we are able to get her revascularized on the left. The 2 wounds on the left have been making good progress and are definitely smaller especially the area over the left lateral malleolus. She is certainly in a lot less pain than she used to be although I still think there is some claudication type pain. Unfortunately she is developed a area on the right lateral foot which is a small open area but I think is threatened. I suspect a small ischemic areas well. Also on her right fifth toe there is an area that is not open but looks as though it is receiving too much pressure from footwear. Finally an area over her right malleolus although I don't think this is on its way to anything ominous 08/17/16 patient presents today for follow up evaluation she tells me that she is really doing fairly well from a pain standpoint compared to where she has been previous. She is tolerating the dressing changes without any complication. She continues to have discharge and drainage however. 08-30-16 Ms. Lohn presents today with her daughter, she states that she continues to have intermittent shooting pains to the left lateral fifth toe at this site of seems to be a healed ulcer. She denies any other issues that her wound related since her last appointment. Her daughter states that she is in need of a tramadol refill at this time as she uses half a tablet at at bedtime to aid in sleep due to foot pain. Iodoflex has been used on all wounds in  previous dressing changes. 09/13/16; the patient has ischemic wounds in her feet. The area over the left lateral malleolus is healed and [name], [name] ([id number]) the area over the left fifth toe looks improved.. She has an area on the lateral aspect of her right foot which is a small but deep wound. Finally she has a [name] open area on the medial aspect of the left medial malleolus. This is superficial 09/27/16; the area over the left lateral  malleolus remains healed. The area over the left fifth toe has a surface and she states the pain is better but I don't think this is completely closed. She has an area on the lateral aspect of her right foot is a small but deep wound. The [name] open area on the medial aspect of the left ankle was closed from last time. 10/18/16; the area over her left lateral malleolus remains healed. The area over the left fifth toe has a surface over the top of this however there is no overt open area. Given the underlying issues of severe PAD and continued pain in the toe I would think it would be unlikely this is truly healed however I am not planning to debridement this area. The area on the right lateral foot which was a more recent wound is a small punched out painful area again has significant surface slough and nonviable tissue. It is clear the patient still has claudication type pain however she remains functional. I have been giving her tramadol when necessary and that seems to help a lot with her pain the patient follows up with Dr. Fletcher [name] on January 18/18 11/01/16; patient missed her follow-up with Dr. Fletcher [name] last week due to a snow day. Follow-up is now on February 15. The areas on the right lateral malleolus and dorsal right fifth toe remain closed over. The fifth toe was tentative is there is a surface eschar however I'm not going to disturb this. Therefore, her only open areas on the right lateral foot. This is a small punched out area. We have been using Prisma.  I'm quite convinced this is an ischemic wound 11/15/16; patient has follow-up with Dr. Fletcher [name] on February 15. She has no open wound on the left leg and doesn't really complain of claudication that I can determine from talking to her. However on the right leg she clearly has some degree of claudication. The remaining open wound is on the right lateral foot. We have been using Iodosorb ointment 11/29/16; patient saw Dr. Fletcher [name] on 11/24/16. His comment is that her wound on her right lateral foot seems to be improving with local wound care. She has known significant right SFA disease. Her lower extremity Doppler will be repeated and according to the patient's daughter that appointment is on March 15. He is left with the thought that endovascular intervention in the right SFA might be necessary. The patient has quite a bit of pain especially at night related to the wound in her right foot. We changed her to Iodosorb ointment last week 12/13/16; this is a patient I follow every 2 weeks largely on a palliative approach at this point about ischemic wounds currently in the right foot. Initially she had them on her left lateral malleolus and left fifth toe. The area on the left lateral malleolus healed and the fifth toe has a surface eschar that I have elected not to remove. Both of these improved after revascularization by Dr. Fletcher [name]. We have been using Iodosorb to the right lateral foot not much change here. 12/27/16; the patient had her arterial studies. This showed known bilateral SFA disease. Stable right ABI 0.5 to stable left ABI at 0.7 to the did not do it TBI on the right it was 0.61 on the left she has a stent in the long segment of her left SFA from 05/18/16. All of her wounds are somewhat better. She states her pain is better in the right foot is improved. Objective Constitutional Sitting or  standing Blood Pressure is within target range for patient.. Pulse regular and within target range for patient.Marland Kitchen  Respirations regular, non-labored and within target range.. Temperature is normal and within [name], [name] ([id number]) the target range for the patient.. Patient's appearance is neat and clean. Appears in no acute distress. Well nourished and well developed.. Vitals Time Taken: 11:05 AM, Height: 64 in, Weight: 158 lbs, BMI: 27.1, Temperature: 98.2 F, Pulse: 72 bpm, Respiratory Rate: 16 breaths/min, Blood Pressure: 141/55 mmHg. Eyes Conjunctivae clear. No discharge.Marland Kitchen Respiratory Respiratory effort is easy and symmetric bilaterally. Rate is normal at rest and on room air.. Cardiovascular Pedal pulses absent bilaterally.. Lymphatic None palpable in the right popliteal or inguinal area. Psychiatric No evidence of depression, anxiety, or agitation. Calm, cooperative, and communicative. Appropriate interactions and affect.. General Notes: Exam; this multi-edged area area on the right lateral foot looks somewhat better and it looks like it's contracting, I therefore am going to continue Iodosorb. The area on the left medial malleolus and dorsal aspect of her left fifth toe which were the original wounds remain closed over. There is no evidence of infection in any area Integumentary (Hair, Skin) Wound #4 status is Open. Original cause of wound was Gradually Appeared. The wound is located on the Right,Lateral Foot. The wound measures 0.3cm length x 0.4cm width x 0.2cm depth; 0.094cm^2 area and 0.019cm^3 volume. The wound is limited to skin breakdown. There is no tunneling or undermining noted. There is a large amount of serous drainage noted. The wound margin is distinct with the outline attached to the wound base. There is medium (34-66%) pink granulation within the wound bed. There is a medium (34- 66%) amount of necrotic tissue within the wound bed including Adherent Slough. Periwound temperature was noted as No Abnormality. The periwound has tenderness on palpation. 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 - for clinic use Cleanse wound with mild soap and water May Shower, gently pat wound dry prior to applying [name] dressing. Anesthetic: Wound #4 Right,Lateral Foot: Topical Lidocaine 4% cream applied to wound bed prior to debridement - for clinic use Skin Barriers/Peri-Wound Care: Wound #4 Right,Lateral Foot: Skin Prep Primary Wound Dressing: Wound #4 Right,Lateral Foot: Iodosorb Ointment Secondary Dressing: Wound #4 Right,Lateral Foot: Dry Gauze Boardered Foam Dressing Dressing Change Frequency: Wound #4 Right,Lateral Foot: Change dressing every other day. Follow-up Appointments: Wound #4 Right,Lateral Foot: Return Appointment in 2 weeks. Edema Control: Wound #4 Right,Lateral Foot: Elevate legs to the level of the heart and pump ankles as often as possible Other: - compression stockings Additional Orders / Instructions: Wound #4 Right,Lateral Foot: Increase protein intake. Medications-please add to medication list.: Wound #4 Right,Lateral Foot: Other: - Vitamin C, Zinc, Multivitamins Tramadol #1 I'm going to continue the iodsorb to the right foot. Appears to be better [name], [name] ([id number]) #2 the patient has marginal right arterial studies. I think she sees Dr. Fletcher [name] in early April #3 the patient's pain seems improved Electronic Signature(s) Signed: 12/27/2016 4:28:03 PM By: [name] Ham MD Entered By: [name] on 12/27/2016 12:06:13 [name]  ([id number]) -------------------------------------------------------------------------------- SuperBill Details  Patient Name: [name], [name] Date of Service: 12/27/2016 Medical Record Patient Account Number: [id number] Number: Treating RN: [name] Jun 25, 1919 (83 y.o. 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

## 2017-01-10 ENCOUNTER — Encounter: Payer: Medicare PPO | Attending: Nurse Practitioner | Admitting: Nurse Practitioner

## 2017-01-10 DIAGNOSIS — E039 Hypothyroidism, unspecified: Secondary | ICD-10-CM | POA: Diagnosis not present

## 2017-01-10 DIAGNOSIS — L97512 Non-pressure chronic ulcer of other part of right foot with fat layer exposed: Secondary | ICD-10-CM | POA: Insufficient documentation

## 2017-01-10 DIAGNOSIS — L97523 Non-pressure chronic ulcer of other part of left foot with necrosis of muscle: Secondary | ICD-10-CM | POA: Insufficient documentation

## 2017-01-10 DIAGNOSIS — I70243 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: Secondary | ICD-10-CM | POA: Diagnosis not present

## 2017-01-10 DIAGNOSIS — I70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: Secondary | ICD-10-CM | POA: Insufficient documentation

## 2017-01-10 DIAGNOSIS — M1990 Unspecified osteoarthritis, unspecified site: Secondary | ICD-10-CM | POA: Insufficient documentation

## 2017-01-11 NOTE — Progress Notes (Signed)
JAILINE, LIEDER ([id number]) Visit Report for 01/10/2017 Arrival Information Details Patient Name: [name], [name] Date of Service: 01/10/2017 9:45 AM Medical Record Patient Account Number: [id number] Number: Treating RN: Ahmed Prima 07-28-1919 (83 y.o. Other Clinician: Date of Birth/Sex: Female) Treating ROBSON, [place] Primary Care Johnanthony Wilden: [employer] Dena Esperanza/Extender: G Referring Wateen Varon: Vinson Moselle in Treatment: 39 Visit Information History Since Last Visit All ordered tests and consults were completed: No Patient Arrived: Gilford Rile Added or deleted any medications: No Arrival Time: 09:52 Any new allergies or adverse reactions: No Accompanied By: daughter Had a fall or experienced change in No Transfer Assistance: EasyP[employer] activities of daily living that may affect Patient Lift risk of falls: Patient Identification Verified: Yes Signs or symptoms of abuse/neglect since last No Secondary Verification Process Yes visito Completed: Hospitalized since last visit: No Patient Requires Transmission- No Has Dressing in Place as Prescribed: Yes Based Precautions: Pain Present Now: No Patient Has Alerts: Yes Electronic Signature(s) Signed: 01/10/2017 5:40:47 PM By: Alric Quan Entered By: Alric Quan on 01/10/2017 09:54:23 [name] ([id number]) -------------------------------------------------------------------------------- Encounter Discharge Information Details Patient Name: [name] Date of Service: 01/10/2017 9:45 AM Medical Record Patient Account Number: [id number] Number: Treating RN: Carolyne Fiscal, Debi 03/26/1919 (83 y.o. Other Clinician: Date of Birth/Sex: Female) Treating ROBSON, [place] Primary Care Orra Nolde: [employer]lee [employer] Anokhi Shannon/Extender: G Referring Dotti Busey: Vinson Moselle in Treatment: 39 Encounter Discharge Information Items Discharge Pain Level: 0 Discharge Condition:  Stable Ambulatory Status: Walker Discharge Destination: Home Transportation: Private Auto Accompanied By: daughter Schedule Follow-up Appointment: Yes Medication Reconciliation completed No and provided to Patient/Care Deysha Cartier: Provided on Clinical Summary of Care: 01/10/2017 Form Type Recipient Paper Patient [facility] Electronic Signature(s) Signed: 01/10/2017 10:17:43 AM By: Ruthine Dose Entered By: Ruthine Dose on 01/10/2017 10:17:43 [name] ([id number]) -------------------------------------------------------------------------------- Lower Extremity Assessment Details Patient Name: [name] Date of Service: 01/10/2017 9:45 AM Medical Record Patient Account Number: [id number] Number: Treating RN: Ahmed Prima 02-28-1919 (83 y.o. Other Clinician: Date of Birth/Sex: Female) Treating ROBSON, MICHAEL Primary Care Liset Mcmonigle: [employer]lee [employer] Fortunata Betty/Extender: G Referring Adriyanna Christians: Vinson Moselle in Treatment: 39 Vascular Assessment Pulses: Dorsalis Pedis Palpable: [Right:Yes] Posterior Tibial Extremity colors, hair growth, and conditions: Extremity Color: [Right:Hyperpigmented] Temperature of Extremity: [Right:Warm] Capillary Refill: [Right:< 3 seconds] Toe Nail Assessment Left: Right: Thick: No Discolored: No Deformed: No Improper Length and Hygiene: No Electronic Signature(s) Signed: 01/10/2017 5:40:47 PM By: Alric Quan Entered By: Alric Quan on 01/10/2017 10:01:19 [name] ([id number]) -------------------------------------------------------------------------------- Multi Wound Chart Details Patient Name: [name] Date of Service: 01/10/2017 9:45 AM Medical Record Patient Account Number: [id number] Number: Treating RN: Ahmed Prima [birth year] (83 y.o. Other Clinician: Date of Birth/Sex: Female) Treating ROBSON, MICHAEL Primary Care Cheryle Dark: [employer] Heide Brossart/Extender: G Referring  Esa Raden: [employer]lee [employer] Weeks in Treatment: 39 Vital Signs Height(in): 64 Pulse(bpm): 68 Weight(lbs): 158 Blood Pressure 160/48 (mmHg): Body Mass Index(BMI): 27 Temperature(F): 97.5 Respiratory Rate 16 (breaths/min): Photos: [4:No Photos] [N/A:N/A] Wound Location: [4:Right Foot - Lateral] [N/A:N/A] Wounding Event: [4:Gradually Appeared] [N/A:N/A] Primary Etiology: [4:Arterial Insufficiency Ulcer N/A] Comorbid History: [4:Cataracts, Hypertension, N/A Osteoarthritis] Date Acquired: [4:07/26/2016] [N/A:N/A] Weeks of Treatment: [4:23] [N/A:N/A] Wound Status: [4:Open] [N/A:N/A] Measurements L x W x D 0.3x0.4x0.1 [N/A:N/A] (cm) Area (cm) : [4:0.094] [N/A:N/A] Volume (cm) : [4:0.009] [N/A:N/A] % Reduction in Area: [4:-100.00%] [N/A:N/A] % Reduction in Volume: -80.00% [N/A:N/A] Classification: [4:Partial Thickness] [N/A:N/A] Exudate Amount: [4:Large] [N/A:N/A] Exudate Type: [4:Serous] [N/A:N/A] Exudate Color: [4:amber] [N/A:N/A] Wound Margin: [4:Distinct, outline attached N/A] Granulation Amount: [4:Medium (34-66%)] [N/A:N/A] Granulation Quality: [4:Pink] [N/A:N/A]  Necrotic Amount: [4:Medium (34-66%)] [N/A:N/A] Exposed Structures: [4:Fascia: No Fat Layer (Subcutaneous Tissue) Exposed: No Tendon: No Muscle: No] [N/A:N/A] Joint: No Bone: No Limited to Skin Breakdown Epithelialization: None N/A N/A Debridement: Debridement ([id number]- N/A N/A 11047) Pre-procedure 10:07 N/A N/A Verification/Time Out Taken: Pain Control: Lidocaine 4% Topical N/A N/A Solution Tissue Debrided: Fibrin/Slough, Exudates, N/A N/A Subcutaneous Level: Skin/Subcutaneous N/A N/A Tissue Debridement Area (sq 0.12 N/A N/A cm): Instrument: Blade N/A N/A Bleeding: Minimum N/A N/A Hemostasis Achieved: Pressure N/A N/A Procedural Pain: 0 N/A N/A Post Procedural Pain: 0 N/A N/A Debridement Treatment Procedure was tolerated N/A N/A Response: well Post Debridement 0.3x0.4x0.2 N/A N/A Measurements  L x W x D (cm) Post Debridement 0.019 N/A N/A Volume: (cm) Periwound Skin Texture: No Abnormalities Noted N/A N/A Periwound Skin No Abnormalities Noted N/A N/A Moisture: Periwound Skin Color: No Abnormalities Noted N/A N/A Temperature: No Abnormality N/A N/A Tenderness on Yes N/A N/A Palpation: Wound Preparation: Ulcer Cleansing: N/A N/A Rinsed/Irrigated with Saline Topical Anesthetic Applied: Other: lidocaine 4% Procedures Performed: Debridement N/A N/A Treatment Notes Wound #4 (Right, Lateral Foot) 1. Cleansed withMERCEDEZ, BOULE ([id number]) Clean wound with Normal Saline 2. Anesthetic Topical Lidocaine 4% cream to wound bed prior to debridement 3. Peri-wound Care: Skin Prep 4. Dressing Applied: Iodosorb Ointment 5. Secondary Dressing Applied Bordered Foam Dressing Notes iodosorb Electronic Signature(s) Signed: 01/10/2017 10:17:26 AM By: Lawanda Cousins Entered By: Lawanda Cousins on 01/10/2017 10:17:26 [name] ([id number]) -------------------------------------------------------------------------------- Multi-Disciplinary Care Plan Details Patient Name: [name] Date of Service: 01/10/2017 9:45 AM Medical Record Patient Account Number: [id number] Number: Treating RN: Ahmed Prima 09/06/1919 (83 y.o. Other Clinician: Date of Birth/Sex: Female) Treating ROBSON, [place] Primary Care Brayleigh Rybacki: [employer]lee [employer] Dezaree Tracey/Extender: G Referring Kendi Defalco: Vinson Moselle in Treatment: 39 Active Inactive ` Abuse / Safety / Falls / Self Care Management Nursing Diagnoses: Potential for falls Goals: Patient will remain injury free Date Initiated: 04/06/2016 Target Resolution Date: 12/22/2016 Goal Status: Active Interventions: Assess fall risk on admission and as needed Notes: ` Nutrition Nursing Diagnoses: Imbalanced nutrition Goals: Patient/caregiver agrees to and verbalizes understanding of need to use nutritional  supplements and/or vitamins as prescribed Date Initiated: 04/06/2016 Target Resolution Date: 12/22/2016 Goal Status: Active Interventions: Assess patient nutrition upon admission and as needed per policy Notes: ` Orientation to the Wound Care Program [place] ([id number]) Nursing Diagnoses: Knowledge deficit related to the wound healing center program Goals: Patient/caregiver will verbalize understanding of the [place] Date Initiated: 04/06/2016 Target Resolution Date: 12/22/2016 Goal Status: Active Interventions: Provide education on orientation to the wound center Notes: ` Pain, Acute or Chronic Nursing Diagnoses: Pain, acute or chronic: actual or potential Potential alteration in comfort, pain Goals: Patient will verbalize adequate pain control and receive pain control interventions during procedures as needed Date Initiated: 04/06/2016 Target Resolution Date: 12/22/2016 Goal Status: Active Interventions: Assess comfort goal upon admission Complete pain assessment as per visit requirements Notes: ` Wound/Skin Impairment Nursing Diagnoses: Impaired tissue integrity Goals: Ulcer/skin breakdown will have a volume reduction of 30% by week 4 Date Initiated: 04/06/2016 Target Resolution Date: 12/22/2016 Goal Status: Active Ulcer/skin breakdown will have a volume reduction of 50% by week 8 Date Initiated: 04/06/2016 Target Resolution Date: 12/22/2016 Goal Status: Active Ulcer/skin breakdown will have a volume reduction of 80% by week 12 YEN, WANDELL ([id number]) Date Initiated: 04/06/2016 Target Resolution Date: 12/22/2016 Goal Status: Active Interventions: Assess patient/caregiver ability to obtain necessary supplies Assess ulceration(s) every visit Notes: Electronic Signature(s) Signed: 01/10/2017 5:40:47 PM By:  Alric Quan Entered By: Alric Quan on 01/10/2017 10:01:49 [name]  ([id number]) -------------------------------------------------------------------------------- Pain Assessment Details Patient Name: NAVEYAH, IACOVELLI Date of Service: 01/10/2017 9:45 AM Medical Record Patient Account Number: [id number] Number: Treating RN: Ahmed Prima Mar 03, 1919 (83 y.o. Other Clinician: Date of Birth/Sex: Female) Treating ROBSON, MICHAEL Primary Care Oluwakemi Salsberry: [employer] Serine Kea/Extender: G Referring Paz Winsett: Vinson Moselle in Treatment: 39 Active Problems Location of Pain Severity and Description of Pain Patient Has Paino No Site Locations With Dressing Change: No Pain Management and Medication Current Pain Management: Electronic Signature(s) Signed: 01/10/2017 5:40:47 PM By: Alric Quan Entered By: Alric Quan on 01/10/2017 09:54:31 [name] ([id number]) -------------------------------------------------------------------------------- Patient/Caregiver Education Details Patient Name: [name] Date of Service: 01/10/2017 9:45 AM Medical Record Patient Account Number: [id number] Number: Treating RN: Carolyne Fiscal, Debi 11-20-1918 (83 y.o. Other Clinician: Date of Birth/Gender: Female) Treating ROBSON, [employer] Primary Care Physician: [place]orelee [employer] Physician/Extender: G Referring Physician: Vinson Moselle in Treatment: 107 Education Assessment Education Provided To: Patient Education Topics Provided Wound/Skin Impairment: Handouts: Other: change dressing as ordered Methods: Demonstration, Explain/Verbal Responses: State content correctly Electronic Signature(s) Signed: 01/10/2017 5:40:47 PM By: Alric Quan Entered By: Alric Quan on 01/10/2017 10:07:13 [name] ([id number]) -------------------------------------------------------------------------------- Wound Assessment Details Patient Name: [name] Date of Service: 01/10/2017 9:45 AM Medical Record  Patient Account Number: [id number] Number: Treating RN: Ahmed Prima 05-23-1919 (83 y.o. Other Clinician: Date of Birth/Sex: Female) Treating ROBSON, [place] Primary Care Aaryanna Hyden: [place]lee [employer] Kortne All/Extender: G Referring Juletta Berhe: [place]lee [employer] Weeks in Treatment: 39 Wound Status Wound Number: 4 Primary Arterial Insufficiency Ulcer Etiology: Wound Location: Right Foot - Lateral Wound Status: Open Wounding Event: Gradually Appeared Comorbid Cataracts, Hypertension, Date Acquired: 07/26/2016 History: Osteoarthritis Weeks Of Treatment: 23 Clustered Wound: No Photos Photo Uploaded By: Alric Quan on 01/10/2017 11:41:34 Wound Measurements Length: (cm) 0.3 Width: (cm) 0.4 Depth: (cm) 0.1 Area: (cm) 0.094 Volume: (cm) 0.009 % Reduction in Area: -100% % Reduction in Volume: -80% Epithelialization: None Tunneling: No Undermining: No Wound Description Classification: Partial Thickness Foul Odor Aft Wound Margin: Distinct, outline attached Exudate Amount: Large Exudate Type: Serous Exudate Color: amber er Cleansing: No Wound Bed Granulation Amount: Medium (34-66%) Exposed Structure Granulation Quality: Pink Fascia Exposed: No Necrotic Amount: Medium (34-66%) Fat Layer (Subcutaneous Tissue) Exposed: No Pagett, Nilah ([id number]) Necrotic Quality: Adherent Slough Tendon Exposed: No Muscle Exposed: No Joint Exposed: No Bone Exposed: No Limited to Skin Breakdown Periwound Skin Texture Texture Color No Abnormalities Noted: No No Abnormalities Noted: No Moisture Temperature / Pain No Abnormalities Noted: No Temperature: No Abnormality Tenderness on Palpation: Yes Wound Preparation Ulcer Cleansing: Rinsed/Irrigated with Saline Topical Anesthetic Applied: Other: lidocaine 4%, Treatment Notes Wound #4 (Right, Lateral Foot) 1. Cleansed with: Clean wound with Normal Saline 2. Anesthetic Topical Lidocaine 4% cream to wound bed  prior to debridement 3. Peri-wound Care: Skin Prep 4. Dressing Applied: Iodosorb Ointment 5. Secondary Dressing Applied Bordered Foam Dressing Notes iodosorb Electronic Signature(s) Signed: 01/10/2017 5:40:47 PM By: Alric Quan Entered By: Alric Quan on 01/10/2017 10:00:46 [name] ([id number]) -------------------------------------------------------------------------------- Vitals Details Patient Name: [name] Date of Service: 01/10/2017 9:45 AM Medical Record Patient Account Number: [id number] Number: Treating RN: Ahmed Prima [birth year] (83 y.o. Other Clinician: Date of Birth/Sex: Female) Treating ROBSON, MICHAEL Primary Care Milca Sytsma: [place] [employer] Candy Leverett/Extender: G Referring Janneth Krasner: [place]lee [employer] Weeks in Treatment: 39 Vital Signs Time Taken: 09:54 Temperature (F): 97.5 Height (in): 64 Pulse (bpm): 68 Weight (lbs): 158 Respiratory Rate (breaths/min): 16 Body Mass Index (BMI): 27.1 Blood  Pressure (mmHg): 160/48 Reference Range: 80 - 120 mg / dl Electronic Signature(s) Signed: 01/10/2017 5:40:47 PM By: Alric Quan Entered By: Alric Quan on 01/10/2017 09:56:28

## 2017-01-18 ENCOUNTER — Telehealth: Payer: Self-pay | Admitting: Cardiovascular Disease

## 2017-01-18 NOTE — Telephone Encounter (Signed)
S/w pt's daughter, [name] (on [place]), to offer April 16, 3:20pm appt w/Dr. Fletcher Anon. Pt scheduled April 30. [name] stated she will be out of town April 16 and would like to keep original appt.

## 2017-01-20 ENCOUNTER — Encounter (INDEPENDENT_AMBULATORY_CARE_PROVIDER_SITE_OTHER): Payer: Medicare PPO | Admitting: Ophthalmology

## 2017-01-20 DIAGNOSIS — H353231 Exudative age-related macular degeneration, bilateral, with active choroidal neovascularization: Secondary | ICD-10-CM | POA: Diagnosis not present

## 2017-01-20 DIAGNOSIS — H35033 Hypertensive retinopathy, bilateral: Secondary | ICD-10-CM

## 2017-01-20 DIAGNOSIS — I10 Essential (primary) hypertension: Secondary | ICD-10-CM

## 2017-01-20 DIAGNOSIS — H43813 Vitreous degeneration, bilateral: Secondary | ICD-10-CM

## 2017-01-24 ENCOUNTER — Encounter: Payer: Medicare PPO | Admitting: Internal Medicine

## 2017-01-24 DIAGNOSIS — I70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: Secondary | ICD-10-CM | POA: Diagnosis not present

## 2017-01-26 NOTE — Progress Notes (Signed)
[name], [name] ([id number]) Visit Report for 01/24/2017 Chief Complaint Document Details Patient Name: [name], [name] Date of Service: 01/24/2017 9:45 AM Medical Record Patient Account Number: [id number] Number: Treating RN: Ahmed Prima 1919/02/18 (83 y.o. Other Clinician: Date of Birth/Sex: Female) Treating Britain Saber Primary Care Provider: Lorelee Market Provider/Extender: G Referring Provider: Vinson Moselle in Treatment: 41 Information Obtained from: Patient Chief Complaint here for follow-up on right lateral foot ulcer Electronic Signature(s) Signed: 01/25/2017 7:57:33 AM By: [name] MD Entered By: [name] on 01/24/2017 10:39:49 [name] ([id number]) -------------------------------------------------------------------------------- HPI Details Patient Name: [name] Date of Service: 01/24/2017 9:45 AM Medical Record Patient Account Number: [id number] Number: Treating RN: Ahmed Prima November 29, 1918 (83 y.o. Other Clinician: Date of Birth/Sex: Female) Treating Khristy Kalan Primary Care Provider: Lorelee Market Provider/Extender: G Referring Provider: Vinson Moselle in Treatment: 42 History of Present Illness HPI Description: 04/06/16; this is a 83 year old woman who arrives accompanied by 2 daughters for a wound on her left ankle and her left fifth toe. These have apparently been present for a year. I'm not quite certain how she came to this clinic however she was being followed by Sharlotte Alamo her podiatrist for these wounds. She was also referred to [facility] and they apparently did a test presumably arterial studies although we don't have any of these results and we couldn't get through to the office today. The family but has been applying a combination of Bactroban and a light bandage and perhaps more recently Silvadene cream. She did have an x-ray of the foot roughly  6 months ago at the podiatry office the family was unaware that if there were any abnormalities. Apparently they have not seen any healing here. Our intake nurse noted a slight skin tear on the right anterior lower leg. Nobody seemed aware of this. ABIs calculated in this clinic was 0.3 on the right and 0.4 on the left I have reviewed things [facility]. There is very little information on this patient. She apparently follows in current total clinic which we don't have information from. She has mentioned already been to a [facility]. She has a history of hypothyroidism, nephrolithiasis arthritis and has had a previous mastectomy. 04/13/16; patient's x-ray was normal. She is already been to see Dr. Delana Meyer vascular surgery. Her arterial exam was from November 2016 this showed a left ABI of 0.62 her right of 0.76. Her duplex ultrasound of the left leg showed biphasic waves in the common femoral and distal femoral artery however monophasic waves in the superficial femoral artery proximal and mid biphasic it distal. 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 The patient tells me that she has pain at night when she tries to lie down which is improved by getting up and sitting in the chair this sounds like claudication at rest. Her wounds are on the left medial malleolus and the dorsal fifth toe small punched out wounds that are right on bone. We use Santyl last week 04/20/16: nurse informed me pt has declined evaluation for significant PAD. she denies systemic s/s of infections. 04/27/16;; the patient has had noninvasive arterial studies done in November 2016. ABI and the left was 0.62. Monophasic waves at the superficial femoral artery. Occluded to the posterior tibial artery. So had greater than 50% stenosis of the right superficial femoral and greater than 50% stenosis of the left superficial femoral artery. She had bilateral tibial peroneal artery disease. Both of the wounds on the left fifth toe and  left lateral malleolus have been present for more than a year. They have been to see vein and vascular. The patient has some pain but miraculously I think the wounds have largely been stable. No evidence of infection 05/04/16; she goes for a noninvasive study tomorrow and then sees Dr. Fletcher Anon on Monday. By the time she is here next week we should have a better picture of whether something can be done with regards to her arterial flow. We continue to have ischemic-looking wounds on the left fifth toe and left lateral malleolus. 05/10/16; the patient went for her arterial studies and saw Dr. Fletcher Anon. As predicted she is felt to have critical limb ischemia. The feeling is that she has occlusion of the SFA. The feeling would she would be a Basic, Daionna ([id number]) candidate for a stent to the SFA. The patient did not make a decision to proceed with the procedure and she is here with family members to discuss this me today. He shouldn't has a lot of pain and cannot sleep and rest well at night per her family. 05/24/16; the patient went and had a complex revascularization/angioplasty of the left superficial femoral artery followed by drug-coated balloon angioplasty and spots stenting. She tolerated the procedure well. She was recommended for dual antiplatelet drugs with Plavix and aspirin for at least a month. She went yesterday for I believe follow-up serial Dopplers and ABIs although I don't see these results. The patient is unfortunately complaining of a lot of pain in her bilateral lower legs below the knees from the ankle to the knees. She apparently was prescribed lidocaine and apparently put this on her legs instead of over the wound areas. This may have something to do with it however there is a lot of edema in her bilateral legs I was able to find her arterial studies from yesterday. The left ABI has improved up to 0.66 post left SFA stent. The bilateral great toe indices remain abnormal with the  left being in the 0.25 range. Duplex ultrasound showed her left SFA stent is patent monophasic waveforms persist in the left leg 06/07/16; continued punched-out areas over the dorsal left fifth toe and left lateral malleolus. No major improvement 06/28/16; the areas over her dorsal left fifth toe and left lateral malleolus are covered in surface slough we are using Iodoflex 07/05/16. We have been using Iodoflex for 2-3 weeks now. I have not been debridement is because of pain 07/19/16 currently we have been using Iodoflex for roughly the past month. Previously we were unable to debride due to pain although today patient states that the pain is not nearly as severe as it has been in the past. In fact she rates this to be a 1 out of 10 and it worse to a to 10 with palpation of the wound. All and all her and her daughter feel like this is actually doing steadily better at this point in time. She is pleased with progress and we have been seeing her every 2 weeks. 08/02/16; this is a delightful 83 year old woman I have not seen in over a month. She has arterial insufficiency wounds remaining over the left lateral malleolus and the left fifth toe. With the help of Dr. Fletcher Anon we are able to get her revascularized on the left. The 2 wounds on the left have been making good progress and are definitely smaller especially the area over the left lateral malleolus. She is certainly in a lot less pain than she used to be  although I still think there is some claudication type pain. Unfortunately she is developed a area on the right lateral foot which is a small open area but I think is threatened. I suspect a small ischemic areas well. Also on her right fifth toe there is an area that is not open but looks as though it is receiving too much pressure from footwear. Finally an area over her right malleolus although I don't think this is on its way to anything ominous 08/17/16 patient presents today for follow up  evaluation she tells me that she is really doing fairly well from a pain standpoint compared to where she has been previous. She is tolerating the dressing changes without any complication. She continues to have discharge and drainage however. 08-30-16 Ms. Wehling presents today with her daughter, she states that she continues to have intermittent shooting pains to the left lateral fifth toe at this site of seems to be a healed ulcer. She denies any other issues that her wound related since her last appointment. Her daughter states that she is in need of a tramadol refill at this time as she uses half a tablet at at bedtime to aid in sleep due to foot pain. Iodoflex has been used on all wounds in previous dressing changes. 09/13/16; the patient has ischemic wounds in her feet. The area over the left lateral malleolus is healed and the area over the left fifth toe looks improved.. She has an area on the lateral aspect of her right foot which is a small but deep wound. Finally she has a new open area on the medial aspect of the left medial malleolus. This is superficial 09/27/16; the area over the left lateral malleolus remains healed. The area over the left fifth toe has a surface and she states the pain is better but I don't think this is completely closed. She has an area on the lateral aspect of her right foot is a small but deep wound. The new open area on the medial aspect of the [facility], Abra ([id number]) left ankle was closed from last time. 10/18/16; the area over her left lateral malleolus remains healed. The area over the left fifth toe has a surface over the top of this however there is no overt open area. Given the underlying issues of severe PAD and continued pain in the toe I would think it would be unlikely this is truly healed however I am not planning to debridement this area. The area on the right lateral foot which was a more recent wound is a small punched out painful area  again has significant surface slough and nonviable tissue. It is clear the patient still has claudication type pain however she remains functional. I have been giving her tramadol when necessary and that seems to help a lot with her pain the patient follows up with Dr. Fletcher Anon on January 18/18 11/01/16; patient missed her follow-up with Dr. Fletcher Anon last week due to a snow day. Follow-up is now on February 15. The areas on the right lateral malleolus and dorsal right fifth toe remain closed over. The fifth toe was tentative is there is a surface eschar however I'm not going to disturb this. Therefore, her only open areas on the right lateral foot. This is a small punched out area. We have been using Prisma. I'm quite convinced this is an ischemic wound 11/15/16; patient has follow-up with Dr. Fletcher Anon on February 15. She has no open wound on the left leg and  doesn't really complain of claudication that I can determine from talking to her. However on the right leg she clearly has some degree of claudication. The remaining open wound is on the right lateral foot. We have been using Iodosorb ointment 11/29/16; patient saw Dr. Fletcher Anon on 11/24/16. His comment is that her wound on her right lateral foot seems to be improving with local wound care. She has known significant right SFA disease. Her lower extremity Doppler will be repeated and according to the patient's daughter that appointment is on March 15. He is left with the thought that endovascular intervention in the right SFA might be necessary. The patient has quite a bit of pain especially at night related to the wound in her right foot. We changed her to Iodosorb ointment last week 12/13/16; this is a patient I follow every 2 weeks largely on a palliative approach at this point about ischemic wounds currently in the right foot. Initially she had them on her left lateral malleolus and left fifth toe. The area on the left lateral malleolus healed and the fifth  toe has a surface eschar that I have elected not to remove. Both of these improved after revascularization by Dr. Fletcher Anon. We have been using Iodosorb to the right lateral foot not much change here. 12/27/16; the patient had her arterial studies. This showed known bilateral SFA disease. Stable right ABI 0.5 to stable left ABI at 0.7 to the did not do it TBI on the right it was 0.61 on the left she has a stent in the long segment of her left SFA from 05/18/16. All of her wounds are somewhat better. She states her pain is better in the right foot is improved. 01/10/17- patient is here for follow-up dilation of her right lateral foot ulcer. She has been tolerating Iodosorb. She is voices no complaints or concerns 01/24/17; small ischemic wound on right lateral foot. still non viable cover. follows with Dr. Fletcher Anon on 4/30. No open area on left foot Electronic Signature(s) Signed: 01/25/2017 7:57:33 AM By: [name] MD Entered By: [name] on 01/24/2017 10:41:05 [name] ([id number]) -------------------------------------------------------------------------------- Physical Exam Details Patient Name: [name] Date of Service: 01/24/2017 9:45 AM Medical Record Patient Account Number: [id number] Number: Treating RN: Ahmed Prima March 28, 1919 (83 y.o. Other Clinician: Date of Birth/Sex: Female) Treating Ezrah Panning Primary Care Provider: Lorelee Market Provider/Extender: G Referring Provider: Lorelee Market Weeks in Treatment: 41 Constitutional Sitting or standing Blood Pressure is within target range for patient.. Pulse regular and within target range for patient.Marland Kitchen Respirations regular, non-labored and within target range.. Temperature is normal and within the target range for the patient.. Patient's appearance is neat and clean. Appears in no acute distress. Well nourished and well developed.. Eyes Conjunctivae clear. No  discharge.Marland Kitchen Respiratory Respiratory effort is easy and symmetric bilaterally. Rate is normal at rest and on room air.. Cardiovascular Pedal pulses absent bilaterally.. Lymphatic none palpable in the right popliteal or inguinal area. Integumentary (Hair, Skin) no evidence of infection around the wound.Marland Kitchen Psychiatric No evidence of depression, anxiety, or agitation. Calm, cooperative, and communicative. Appropriate interactions and affect.. Notes wound exam; small punched out area on the right lateral foot non viable surface however I elected not to debride. No evidence of surrouinding infection Electronic Signature(s) Signed: 01/25/2017 7:57:33 AM By: [name] MD Entered By: [name] on 01/24/2017 10:43:54 [name] ([id number]) -------------------------------------------------------------------------------- Physician Orders Details Patient Name: [name] Date of Service: 01/24/2017 9:45 AM Medical Record Patient Account Number: [id number]  [id number] Number: Treating RN: Ahmed Prima [birth year] (83 y.o. Other Clinician: Date of Birth/Sex: Female) Treating Chase Knebel Primary Care Provider: Lorelee Market Provider/Extender: G Referring Provider: Vinson Moselle in Treatment: 66 Verbal / Phone Orders: Yes Clinician: Carolyne Fiscal, Debi Read Back and Verified: Yes Diagnosis Coding Wound Cleansing Wound #4 Right,Lateral Foot o Clean wound with Normal Saline. - for clinic use o Cleanse wound with mild soap and water o May Shower, gently pat wound dry prior to applying new dressing. Anesthetic Wound #4 Right,Lateral Foot o Topical Lidocaine 4% cream applied to wound bed prior to debridement - for clinic use Skin Barriers/Peri-Wound Care Wound #4 Right,Lateral Foot o Skin Prep Primary Wound Dressing Wound #4 Right,Lateral Foot o Iodosorb Ointment Secondary Dressing Wound #4 Right,Lateral Foot o Dry Gauze o Boardered  Foam Dressing Dressing Change Frequency Wound #4 Right,Lateral Foot o Change dressing every other day. Follow-up Appointments Wound #4 Right,Lateral Foot o Return Appointment in 2 weeks. Edema Control Wound #4 Right,Lateral Foot Mclear, [place] ([id number]) o Elevate legs to the level of the heart and pump ankles as often as possible o Other: - compression stockings Additional Orders / Instructions Wound #4 Right,Lateral Foot o Increase protein intake. Medications-please add to medication list. Wound #4 Right,Lateral Foot o Other: - Vitamin C, Zinc, Multivitamins Tramadol Electronic Signature(s) Signed: 01/24/2017 4:52:03 PM By: Alric Quan Signed: 01/25/2017 7:57:33 AM By: [name] MD Entered By: Alric Quan on 01/24/2017 10:11:37 [name] ([id number]) -------------------------------------------------------------------------------- Problem List Details Patient Name: [name] Date of Service: 01/24/2017 9:45 AM Medical Record Patient Account Number: [id number] Number: Treating RN: Carolyne Fiscal, Debi 1919-07-18 (83 y.o. 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 Other Clinician: Date of Birth/Sex: Female) Treating Danetra Glock, Perry Primary Care Provider: Lorelee Market Provider/Extender: G Referring Provider: Vinson Moselle in Treatment: 41 Subjective Chief Complaint Information obtained from Patient here for follow-up on right lateral foot ulcer History of Present Illness (HPI) 04/06/16; this is a 83 year old woman who arrives accompanied by 2 daughters for a wound on her left ankle and her left fifth toe. These have apparently been present for a year. I'm not quite certain how she came to this clinic however she was being followed by Sharlotte Alamo her podiatrist for these wounds. She was also referred to [facility] and they apparently did a test presumably arterial studies although we don't have any of these results and we couldn't get through to the office today. The family but has been applying a combination of Bactroban and a light bandage and perhaps more recently Silvadene cream. She did have an x-ray of the foot roughly 6 months ago at the podiatry office the  family was unaware that if there were any abnormalities. Apparently they have not seen any healing here. Our intake nurse noted a slight skin tear on the right anterior lower leg. Nobody seemed aware of this. ABIs calculated in this clinic was 0.3 on the right and 0.4 on the left I have reviewed things [facility]. There is very little information on this patient. She apparently follows in current total clinic which we don't have information from. She has mentioned already been to a [facility]. She has a history of  hypothyroidism, nephrolithiasis arthritis and has had a previous mastectomy. 04/13/16; patient's x-ray was normal. She is already been to see Dr. Delana Meyer vascular surgery. Her arterial exam was from November 2016 this showed a left ABI of 0.62 her right of 0.76. Her duplex ultrasound of the left leg showed biphasic waves in the common femoral and distal femoral artery however monophasic waves in the superficial femoral artery proximal and mid biphasic it distal. 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 The patient tells me that she has pain at night when she tries to lie down which is improved by getting up and sitting in the chair this sounds like claudication at rest. Her wounds are on the left medial malleolus and the dorsal fifth toe small punched out wounds that are right on bone. We use Santyl last week 04/20/16: nurse informed me pt has declined evaluation for significant PAD. she denies systemic s/s of infections. 04/27/16;; the patient has had noninvasive arterial studies done in November 2016. ABI and the left was 0.62. Monophasic waves at the superficial femoral artery. Occluded to the posterior tibial artery. So had greater than 50% stenosis of the right superficial femoral and greater than 50% stenosis of the left superficial femoral artery. She had bilateral tibial peroneal artery disease. Both of the wounds on the left fifth toe and left lateral malleolus have been present for more than a year. They have been to see vein and vascular. The patient has some pain but miraculously I think the wounds have largely been stable. No Celaya, June ([id number]) evidence of infection 05/04/16; she goes for a noninvasive study tomorrow and then sees Dr. Fletcher Anon on Monday. By the time she is here next week we should have a better picture of whether something can be done with regards to her arterial flow. We continue to have ischemic-looking wounds on the left fifth toe and left lateral  malleolus. 05/10/16; the patient went for her arterial studies and saw Dr. Fletcher Anon. As predicted she is felt to have critical limb ischemia. The feeling is that she has occlusion of the SFA. The feeling would she would be a candidate for a stent to the SFA. The patient did not make a decision to proceed with the procedure and she is here with family members to discuss this me today. He shouldn't has a lot of pain and cannot sleep and rest well at night per her family. 05/24/16; the patient went and had a complex revascularization/angioplasty of the left superficial femoral artery followed by drug-coated balloon angioplasty and spots stenting. She tolerated the procedure well. She was recommended for dual antiplatelet drugs with Plavix and aspirin for at least a month. She went yesterday for I believe follow-up serial Dopplers and ABIs although I don't see these results. The patient is unfortunately complaining of a lot of pain in her bilateral lower legs below the knees from the ankle to the knees. She apparently was prescribed lidocaine and apparently put this  on her legs instead of over the wound areas. This may have something to do with it however there is a lot of edema in her bilateral legs I was able to find her arterial studies from yesterday. The left ABI has improved up to 0.66 post left SFA stent. The bilateral great toe indices remain abnormal with the left being in the 0.25 range. Duplex ultrasound showed her left SFA stent is patent monophasic waveforms persist in the left leg 06/07/16; continued punched-out areas over the dorsal left fifth toe and left lateral malleolus. No major improvement 06/28/16; the areas over her dorsal left fifth toe and left lateral malleolus are covered in surface slough we are using Iodoflex 07/05/16. We have been using Iodoflex for 2-3 weeks now. I have not been debridement is because of pain 07/19/16 currently we have been using Iodoflex for roughly the past  month. Previously we were unable to debride due to pain although today patient states that the pain is not nearly as severe as it has been in the past. In fact she rates this to be a 1 out of 10 and it worse to a to 10 with palpation of the wound. All and all her and her daughter feel like this is actually doing steadily better at this point in time. She is pleased with progress and we have been seeing her every 2 weeks. 08/02/16; this is a delightful 83 year old woman I have not seen in over a month. She has arterial insufficiency wounds remaining over the left lateral malleolus and the left fifth toe. With the help of Dr. Fletcher Anon we are able to get her revascularized on the left. The 2 wounds on the left have been making good progress and are definitely smaller especially the area over the left lateral malleolus. She is certainly in a lot less pain than she used to be although I still think there is some claudication type pain. Unfortunately she is developed a area on the right lateral foot which is a small open area but I think is threatened. I suspect a small ischemic areas well. Also on her right fifth toe there is an area that is not open but looks as though it is receiving too much pressure from footwear. Finally an area over her right malleolus although I don't think this is on its way to anything ominous 08/17/16 patient presents today for follow up evaluation she tells me that she is really doing fairly well from a pain standpoint compared to where she has been previous. She is tolerating the dressing changes without any complication. She continues to have discharge and drainage however. 08-30-16 Ms. Pokorney presents today with her daughter, she states that she continues to have intermittent shooting pains to the left lateral fifth toe at this site of seems to be a healed ulcer. She denies any other issues that her wound related since her last appointment. Her daughter states that she is  in need of a tramadol refill at this time as she uses half a tablet at at bedtime to aid in sleep due to foot pain. Iodoflex has been used on all wounds in previous dressing changes. 09/13/16; the patient has ischemic wounds in her feet. The area over the left lateral malleolus is healed and Grist, Devynne ([id number]) the area over the left fifth toe looks improved.. She has an area on the lateral aspect of her right foot which is a small but deep wound. Finally she has a new open area on  the medial aspect of the left medial malleolus. This is superficial 09/27/16; the area over the left lateral malleolus remains healed. The area over the left fifth toe has a surface and she states the pain is better but I don't think this is completely closed. She has an area on the lateral aspect of her right foot is a small but deep wound. The new open area on the medial aspect of the left ankle was closed from last time. 10/18/16; the area over her left lateral malleolus remains healed. The area over the left fifth toe has a surface over the top of this however there is no overt open area. Given the underlying issues of severe PAD and continued pain in the toe I would think it would be unlikely this is truly healed however I am not planning to debridement this area. The area on the right lateral foot which was a more recent wound is a small punched out painful area again has significant surface slough and nonviable tissue. It is clear the patient still has claudication type pain however she remains functional. I have been giving her tramadol when necessary and that seems to help a lot with her pain the patient follows up with Dr. Fletcher Anon on January 18/18 11/01/16; patient missed her follow-up with Dr. Fletcher Anon last week due to a snow day. Follow-up is now on February 15. The areas on the right lateral malleolus and dorsal right fifth toe remain closed over. The fifth toe was tentative is there is a surface eschar  however I'm not going to disturb this. Therefore, her only open areas on the right lateral foot. This is a small punched out area. We have been using Prisma. I'm quite convinced this is an ischemic wound 11/15/16; patient has follow-up with Dr. Fletcher Anon on February 15. She has no open wound on the left leg and doesn't really complain of claudication that I can determine from talking to her. However on the right leg she clearly has some degree of claudication. The remaining open wound is on the right lateral foot. We have been using Iodosorb ointment 11/29/16; patient saw Dr. Fletcher Anon on 11/24/16. His comment is that her wound on her right lateral foot seems to be improving with local wound care. She has known significant right SFA disease. Her lower extremity Doppler will be repeated and according to the patient's daughter that appointment is on March 15. He is left with the thought that endovascular intervention in the right SFA might be necessary. The patient has quite a bit of pain especially at night related to the wound in her right foot. We changed her to Iodosorb ointment last week 12/13/16; this is a patient I follow every 2 weeks largely on a palliative approach at this point about ischemic wounds currently in the right foot. Initially she had them on her left lateral malleolus and left fifth toe. The area on the left lateral malleolus healed and the fifth toe has a surface eschar that I have elected not to remove. Both of these improved after revascularization by Dr. Fletcher Anon. We have been using Iodosorb to the right lateral foot not much change here. 12/27/16; the patient had her arterial studies. This showed known bilateral SFA disease. Stable right ABI 0.5 to stable left ABI at 0.7 to the did not do it TBI on the right it was 0.61 on the left she has a stent in the long segment of her left SFA from 05/18/16. All of her wounds are  somewhat better. She states her pain is better in the right foot is  improved. 01/10/17- patient is here for follow-up dilation of her right lateral foot ulcer. She has been tolerating Iodosorb. She is voices no complaints or concerns 01/24/17; small ischemic wound on right lateral foot. still non viable cover. follows with Dr. Fletcher Anon on 4/30. No open area on left foot [name], [name] ([id number]) Objective Constitutional Sitting or standing Blood Pressure is within target range for patient.. Pulse regular and within target range for patient.Marland Kitchen Respirations regular, non-labored and within target range.. Temperature is normal and within the target range for the patient.. Patient's appearance is neat and clean. Appears in no acute distress. Well nourished and well developed.. Vitals Time Taken: 9:49 AM, Height: 64 in, Weight: 158 lbs, BMI: 27.1, Temperature: 98.1 F, Pulse: 78 bpm, Respiratory Rate: 16 breaths/min, Blood Pressure: 138/52 mmHg. Eyes Conjunctivae clear. No discharge.Marland Kitchen Respiratory Respiratory effort is easy and symmetric bilaterally. Rate is normal at rest and on room air.. Cardiovascular Pedal pulses absent bilaterally.. Lymphatic none palpable in the right popliteal or inguinal area. Psychiatric No evidence of depression, anxiety, or agitation. Calm, cooperative, and communicative. Appropriate interactions and affect.. General Notes: wound exam; small punched out area on the right lateral foot non viable surface however I elected not to debride. No evidence of surrouinding infection Integumentary (Hair, Skin) no evidence of infection around the wound.. Wound #4 status is Open. Original cause of wound was Gradually Appeared. The wound is located on the Right,Lateral Foot. The wound measures 0.3cm length x 0.3cm width x 0.2cm depth; 0.071cm^2 area and 0.014cm^3 volume. The wound is limited to skin breakdown. There is no tunneling or undermining noted. There is a large amount of serous drainage noted. The wound margin is distinct with the  outline attached to the wound base. There is no granulation within the wound bed. There is a large (67-100%) amount of necrotic tissue within the wound bed including Adherent Slough. Periwound temperature was noted as No Abnormality. The periwound has tenderness on palpation. 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 - for clinic use Cleanse wound with mild soap and water May Shower, gently pat wound dry prior to applying new dressing. Anesthetic: Wound #4 Right,Lateral Foot: Topical Lidocaine 4% cream applied to wound bed prior to debridement - for clinic use Skin Barriers/Peri-Wound Care: Wound #4 Right,Lateral Foot: Skin Prep Primary Wound Dressing: Wound #4 Right,Lateral Foot: Iodosorb Ointment Secondary Dressing: Wound #4 Right,Lateral Foot: Dry Gauze Boardered Foam Dressing Dressing Change Frequency: Wound #4 Right,Lateral Foot: Change dressing every other day. Follow-up Appointments: Wound #4 Right,Lateral Foot: Return Appointment in 2 weeks. Edema Control: Wound #4 Right,Lateral Foot: Elevate legs to the level of the heart and pump ankles as often as possible Other: - compression stockings Additional Orders / Instructions: Wound #4 Right,Lateral Foot: Increase protein intake. Medications-please add to medication list.: Wound #4 Right,Lateral Foot: Other: - Vitamin C, Zinc, Multivitamins Tramadol Maund, Megha ([id number]) 1 contineu iodosorb 2 for now I am viewing this as a pallitive situation. 3 She sees Dr.  Fletcher Anon on 4/30 ofurther possilbe  interventins on the right sideoo If so I'll consider a more aggressive wound care approach 4 She takes tramadol at night fror pain, I've renewed this 500 q6h prn #40 Electronic Signature(s) Signed: 01/25/2017 7:57:33 AM By: [name] MD Entered By: [name] on 01/24/2017 10:45:50 [name] ([id number]) -------------------------------------------------------------------------------- SuperBill Details Patient Name: [name] Date of Service: 01/24/2017 Medical Record Patient Account Number: [id number] Number: Treating RN: Carolyne Fiscal, Debi 1919/02/21 (83 y.o. 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

## 2017-01-26 NOTE — Progress Notes (Signed)
[name], [name] ([id number]) Visit Report for 01/24/2017 Arrival Information Details Patient Name: [name], [name] Date of Service: 01/24/2017 9:45 AM Medical Record Patient Account Number: [id number] Number: Treating RN: Ahmed Prima January 01, 1919 (83 y.o. Other Clinician: Date of Birth/Sex: Female) Treating ROBSON, MICHAEL Primary Care Owain Eckerman: [place] Kaulin Chaves/Extender: G Referring Benton Tooker: Vinson Moselle in Treatment: 23 Visit Information History Since Last Visit All ordered tests and consults were completed: No Patient Arrived: Gilford Rile Added or deleted any medications: No Arrival Time: 09:48 Any new allergies or adverse reactions: No Accompanied By: daughters Had a fall or experienced change in No Transfer Assistance: EasyPivot activities of daily living that may affect Patient Lift risk of falls: Patient Identification Verified: Yes Signs or symptoms of abuse/neglect since last No Secondary Verification Process Yes visito Completed: Hospitalized since last visit: No Patient Requires Transmission- No Has Dressing in Place as Prescribed: Yes Based Precautions: Pain Present Now: No Patient Has Alerts: Yes Electronic Signature(s) Signed: 01/24/2017 4:52:03 PM By: Alric Quan Entered By: Alric Quan on 01/24/2017 09:48:58 [name] ([id number]) -------------------------------------------------------------------------------- Clinic Level of Care Assessment Details Patient Name: [name] Date of Service: 01/24/2017 9:45 AM Medical Record Patient Account Number: [id number] Number: Treating RN: Ahmed Prima 1919/03/11 (83 y.o. Other Clinician: Date of Birth/Sex: Female) Treating ROBSON, [place] Primary Care Emeli Goguen: [place] Gerri Acre/Extender: G Referring Placida Cambre: Vinson Moselle in Treatment: 41 Clinic Level of Care Assessment Items TOOL 4 Quantity Score X - Use when only an  EandM is performed on FOLLOW-UP visit 1 0 ASSESSMENTS - Nursing Assessment / Reassessment X - Reassessment of Co-morbidities (includes updates in patient status) 1 10 X - Reassessment of Adherence to Treatment Plan 1 5 ASSESSMENTS - Wound and Skin Assessment / Reassessment X - Simple Wound Assessment / Reassessment - one wound 1 5 []  - Complex Wound Assessment / Reassessment - multiple wounds 0 []  - Dermatologic / Skin Assessment (not related to wound area) 0 ASSESSMENTS - Focused Assessment []  - Circumferential Edema Measurements - multi extremities 0 []  - Nutritional Assessment / Counseling / Intervention 0 []  - Lower Extremity Assessment (monofilament, tuning fork, pulses) 0 []  - Peripheral Arterial Disease Assessment (using hand held doppler) 0 ASSESSMENTS - Ostomy and/or Continence Assessment and Care []  - Incontinence Assessment and Management 0 []  - Ostomy Care Assessment and Management (repouching, etc.) 0 PROCESS - Coordination of Care X - Simple Patient / Family Education for ongoing care 1 15 []  - Complex (extensive) Patient / Family Education for ongoing care 0 []  - Staff obtains Programmer, systems, Records, Test Results / Process Orders 0 []  - Staff telephones HHA, Nursing Homes / Clarify orders / etc 0 [name], [name] ([id number]) []  - Routine Transfer to another Facility (non-emergent condition) 0 []  - Routine Hospital Admission (non-emergent condition) 0 []  - New Admissions / Biomedical engineer / Ordering NPWT, Apligraf, etc. 0 []  - Emergency Hospital Admission (emergent condition) 0 X - Simple Discharge Coordination 1 10 []  - Complex (extensive) Discharge Coordination 0 PROCESS - Special Needs []  - Pediatric / Minor Patient Management 0 []  - Isolation Patient Management 0 []  - Hearing / Language / Visual special needs 0 []  - Assessment of Community assistance (transportation, D/C planning, etc.) 0 []  - Additional assistance / Altered mentation 0 []  - Support Surface(s)  Assessment (bed, cushion, seat, etc.) 0 INTERVENTIONS - Wound Cleansing / Measurement X - Simple Wound Cleansing - one wound 1 5 []  - Complex Wound Cleansing - multiple wounds 0 X - Wound Imaging (photographs -  any number of wounds) 1 5 []  - Wound Tracing (instead of photographs) 0 X - Simple Wound Measurement - one wound 1 5 []  - Complex Wound Measurement - multiple wounds 0 INTERVENTIONS - Wound Dressings X - Small Wound Dressing one or multiple wounds 1 10 []  - Medium Wound Dressing one or multiple wounds 0 []  - Large Wound Dressing one or multiple wounds 0 X - Application of Medications - topical 1 5 []  - Application of Medications - injection 0 [name], [name] ([id number]) INTERVENTIONS - Miscellaneous []  - External ear exam 0 []  - Specimen Collection (cultures, biopsies, blood, body fluids, etc.) 0 []  - Specimen(s) / Culture(s) sent or taken to Lab for analysis 0 []  - Patient Transfer (multiple staff / Harrel Lemon Lift / Similar devices) 0 []  - Simple Staple / Suture removal (25 or less) 0 []  - Complex Staple / Suture removal (26 or more) 0 []  - Hypo / Hyperglycemic Management (close monitor of Blood Glucose) 0 []  - Ankle / Brachial Index (ABI) - do not check if billed separately 0 X - Vital Signs 1 5 Has the patient been seen at the hospital within the last three years: Yes Total Score: 80 Level Of Care: New/Established - Level 3 Electronic Signature(s) Signed: 01/24/2017 4:52:03 PM By: Alric Quan Entered By: Alric Quan on 01/24/2017 13:22:57 [name] ([id number]) -------------------------------------------------------------------------------- Encounter Discharge Information Details Patient Name: [name] Date of Service: 01/24/2017 9:45 AM Medical Record Patient Account Number: [id number] Number: Treating RN: Carolyne Fiscal, Debi 05/09/1919 (83 y.o. Other Clinician: Date of Birth/Sex: Female) Treating ROBSON, [place] Primary Care Clayborne Divis:  [place] Kyonna Frier/Extender: G Referring Russell Engelstad: Vinson Moselle in Treatment: 33 Encounter Discharge Information Items Discharge Pain Level: 0 Discharge Condition: Stable Ambulatory Status: Walker Discharge Destination: Home Transportation: Private Auto Accompanied By: daughters Schedule Follow-up Appointment: Yes Medication Reconciliation completed No and provided to Patient/Care Kishana Battey: Provided on Clinical Summary of Care: 01/24/2017 Form Type Recipient Paper Patient [facility] Electronic Signature(s) Signed: 01/24/2017 10:29:55 AM By: Ruthine Dose Entered By: Ruthine Dose on 01/24/2017 10:29:55 [name] ([id number]) -------------------------------------------------------------------------------- Lower Extremity Assessment Details Patient Name: [name] Date of Service: 01/24/2017 9:45 AM Medical Record Patient Account Number: [id number] Number: Treating RN: Ahmed Prima Oct 06, 1919 (83 y.o. Other Clinician: Date of Birth/Sex: Female) Treating ROBSON, MICHAEL Primary Care Devere Brem: [place] Driana Dazey/Extender: G Referring Hortense Cantrall: Vinson Moselle in Treatment: 41 Vascular Assessment Pulses: Dorsalis Pedis Palpable: [Right:Yes] Posterior Tibial Extremity colors, hair growth, and conditions: Extremity Color: [Right:Hyperpigmented] Temperature of Extremity: [Right:Warm] Capillary Refill: [Right:< 3 seconds] Toe Nail Assessment Left: Right: Thick: No Discolored: No Deformed: No Improper Length and Hygiene: No Electronic Signature(s) Signed: 01/24/2017 4:52:03 PM By: Alric Quan Entered By: Alric Quan on 01/24/2017 09:58:16 [name] ([id number]) -------------------------------------------------------------------------------- Multi Wound Chart Details Patient Name: [name] Date of Service: 01/24/2017 9:45 AM Medical Record Patient Account Number:  [id number] Number: Treating RN: Ahmed Prima 1919-08-26 (83 y.o. 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 N/A] Comorbid History: [4:Cataracts, Hypertension, N/A Osteoarthritis] Date Acquired: [4:07/26/2016] [N/A:N/A] Weeks of Treatment: [4:25] [N/A:N/A] Wound Status: [4:Open] [N/A:N/A] Measurements L x W x D 0.3x0.3x0.2 [N/A:N/A] (cm) Area (cm) : [  4:0.071] [N/A:N/A] Volume (cm) : [4:0.014] [N/A:N/A] % Reduction in Area: [4:-51.10%] [N/A:N/A] % Reduction in Volume: -180.00% [N/A:N/A] Classification: [4:Partial Thickness] [N/A:N/A] Exudate Amount: [4:Large] [N/A:N/A] Exudate Type: [4:Serous] [N/A:N/A] Exudate Color: [4:amber] [N/A:N/A] Wound Margin: [4:Distinct, outline attached N/A] Granulation Amount: [4:None Present (0%)] [N/A:N/A] Necrotic Amount: [4:Large (67-100%)] [N/A:N/A] Exposed Structures: [4:Fascia: No Fat Layer (Subcutaneous Tissue) Exposed: No Tendon: No Muscle: No Joint: No] [N/A:N/A] Bone: No Limited to Skin Breakdown Epithelialization: None N/A N/A Periwound Skin Texture: No Abnormalities Noted N/A N/A Periwound Skin No Abnormalities Noted N/A N/A Moisture: Periwound Skin Color: No Abnormalities Noted N/A N/A Temperature: No Abnormality N/A N/A Tenderness on Yes N/A N/A Palpation: Wound Preparation: Ulcer Cleansing: N/A N/A Rinsed/Irrigated with Saline Topical Anesthetic Applied: Other: lidocaine 4% Treatment Notes Wound #4  (Right, Lateral Foot) 1. Cleansed with: Clean wound with Normal Saline 2. Anesthetic Topical Lidocaine 4% cream to wound bed prior to debridement 3. Peri-wound Care: Skin Prep 4. Dressing Applied: Iodosorb Ointment 5. Secondary Dressing Applied Bordered Foam Dressing Dry Gauze Notes iodosorb Electronic Signature(s) Signed: 01/25/2017 7:57:33 AM By: [name] MD Entered By: [name] on 01/24/2017 10:39:40 [name] ([id number]) -------------------------------------------------------------------------------- Multi-Disciplinary Care Plan Details Patient Name: [name] Date of Service: 01/24/2017 9:45 AM Medical Record Patient Account Number: [id number] Number: Treating RN: Ahmed Prima 09/07/1919 (83 y.o. Other Clinician: Date of Birth/Sex: Female) Treating ROBSON, [place] Primary Care Khalee Mazo: [place] Jaliana Medellin/Extender: G Referring Santina Trillo: Vinson Moselle in Treatment: 61 Active Inactive ` Abuse / Safety / Falls / Self Care Management Nursing Diagnoses: Potential for falls Goals: Patient will remain injury free Date Initiated: 04/06/2016 Target Resolution Date: 12/22/2016 Goal Status: Active Interventions: Assess fall risk on admission and as needed Notes: ` Nutrition Nursing Diagnoses: Imbalanced nutrition Goals: Patient/caregiver agrees to and verbalizes understanding of need to use nutritional supplements and/or vitamins as prescribed Date Initiated: 04/06/2016 Target Resolution Date: 12/22/2016 Goal Status: Active Interventions: Assess patient nutrition upon admission and as needed per policy Notes: ` Orientation to the [place], Corisa ([id number]) Nursing Diagnoses: Knowledge deficit related to the wound healing center program Goals: Patient/caregiver will verbalize understanding of the [place] Date Initiated: 04/06/2016 Target Resolution Date:  12/22/2016 Goal Status: Active Interventions: Provide education on orientation to the wound center Notes: ` Pain, Acute or Chronic Nursing Diagnoses: Pain, acute or chronic: actual or potential Potential alteration in comfort, pain Goals: Patient will verbalize adequate pain control and receive pain control interventions during procedures as needed Date Initiated: 04/06/2016 Target Resolution Date: 12/22/2016 Goal Status: Active Interventions: Assess comfort goal upon admission Complete pain assessment as per visit requirements Notes: ` Wound/Skin Impairment Nursing Diagnoses: Impaired tissue integrity Goals: Ulcer/skin breakdown will have a volume reduction of 30% by week 4 Date Initiated: 04/06/2016 Target Resolution Date: 12/22/2016 Goal Status: Active Ulcer/skin breakdown will have a volume reduction of 50% by week 8 Date Initiated: 04/06/2016 Target Resolution Date: 12/22/2016 Goal Status: Active Ulcer/skin breakdown will have a volume reduction of 80% by week 12 [name], [name] ([id number]) Date Initiated: 04/06/2016 Target Resolution Date: 12/22/2016 Goal Status: Active Interventions: Assess patient/caregiver ability to obtain necessary supplies Assess ulceration(s) every visit Notes: Electronic Signature(s) Signed: 01/24/2017 4:52:03 PM By: Alric Quan Entered By: Alric Quan on 01/24/2017 09:58:24 [name] ([id number]) -------------------------------------------------------------------------------- Pain Assessment Details Patient Name: [name] Date of Service: 01/24/2017 9:45 AM Medical Record Patient Account Number: [id number] Number: Treating RN: Ahmed Prima [birth year] (83 y.o. Other Clinician: Date of Birth/Sex: Female) Treating ROBSON, MICHAEL  Primary Care Nolin Grell: [place] Marce Schartz/Extender: G Referring Keri Tavella: Vinson Moselle in Treatment: 41 Active Problems Location of Pain Severity and  Description of Pain Patient Has Paino No Site Locations With Dressing Change: No Pain Management and Medication Current Pain Management: Electronic Signature(s) Signed: 01/24/2017 4:52:03 PM By: Alric Quan Entered By: Alric Quan on 01/24/2017 09:49:14 [name] ([id number]) -------------------------------------------------------------------------------- Patient/Caregiver Education Details Patient Name: [name] Date of Service: 01/24/2017 9:45 AM Medical Record Patient Account Number: [id number] Number: Treating RN: Carolyne Fiscal, Debi 1918-12-21 (83 y.o. Other Clinician: Date of Birth/Gender: Female) Treating ROBSON, [place] Primary Care Physician: [place] Physician/Extender: G Referring Physician: Vinson Moselle in Treatment: 19 Education Assessment Education Provided To: Patient Education Topics Provided Wound/Skin Impairment: Handouts: Other: changed dressing as ordered Methods: Demonstration, Explain/Verbal Responses: State content correctly Electronic Signature(s) Signed: 01/24/2017 4:52:03 PM By: Alric Quan Entered By: Alric Quan on 01/24/2017 10:00:27 [name] ([id number]) -------------------------------------------------------------------------------- Wound Assessment Details Patient Name: [name] Date of Service: 01/24/2017 9:45 AM Medical Record Patient Account Number: [id number] Number: Treating RN: Ahmed Prima 1919-01-19 (83 y.o. Other Clinician: Date of Birth/Sex: Female) Treating ROBSON, [place] Primary Care Gwen Edler: [place] Iridian Reader/Extender: G Referring Yitzchak Kothari: [place] Weeks in Treatment: 41 Wound Status Wound Number: 4 Primary Arterial Insufficiency Ulcer Etiology: Wound Location: Right Foot - Lateral Wound Status: Open Wounding Event: Gradually Appeared Comorbid Cataracts, Hypertension, Date Acquired: 07/26/2016 History:  Osteoarthritis Weeks Of Treatment: 25 Clustered Wound: No Photos Photo Uploaded By: Alric Quan on 01/24/2017 16:40:47 Wound Measurements Length: (cm) 0.3 Width: (cm) 0.3 Depth: (cm) 0.2 Area: (cm) 0.071 Volume: (cm) 0.014 % Reduction in Area: -51.1% % Reduction in Volume: -180% Epithelialization: None Tunneling: No Undermining: No Wound Description Classification: Partial Thickness Foul Odor Afte Wound Margin: Distinct, outline attached Slough/Fibrino Exudate Amount: Large Exudate Type: Serous Exudate Color: amber r Cleansing: No Yes Wound Bed Granulation Amount: None Present (0%) Exposed Structure Necrotic Amount: Large (67-100%) Fascia Exposed: No Necrotic Quality: Adherent Slough Fat Layer (Subcutaneous Tissue) Exposed: No [name], [name] ([id number]) Tendon Exposed: No Muscle Exposed: No Joint Exposed: No Bone Exposed: No Limited to Skin Breakdown Periwound Skin Texture Texture Color No Abnormalities Noted: No No Abnormalities Noted: No Moisture Temperature / Pain No Abnormalities Noted: No Temperature: No Abnormality Tenderness on Palpation: Yes Wound Preparation Ulcer Cleansing: Rinsed/Irrigated with Saline Topical Anesthetic Applied: Other: lidocaine 4%, Treatment Notes Wound #4 (Right, Lateral Foot) 1. Cleansed with: Clean wound with Normal Saline 2. Anesthetic Topical Lidocaine 4% cream to wound bed prior to debridement 3. Peri-wound Care: Skin Prep 4. Dressing Applied: Iodosorb Ointment 5. Secondary Dressing Applied Bordered Foam Dressing Dry Gauze Notes iodosorb Electronic Signature(s) Signed: 01/24/2017 4:52:03 PM By: Alric Quan Entered By: Alric Quan on 01/24/2017 09:56:33 [name] ([id number]) -------------------------------------------------------------------------------- Vitals Details Patient Name: [name] Date of Service: 01/24/2017 9:45 AM Medical Record Patient Account Number:  [id number] Number: Treating RN: Ahmed Prima 11-25-1918 (83 y.o. Other Clinician: Date of Birth/Sex: Female) Treating ROBSON, MICHAEL Primary Care Willis Kuipers: [place] Mirabella Hilario/Extender: G Referring [place] Firkus: [place] Weeks in Treatment: 41 Vital Signs Time Taken: 09:49 Temperature (F): 98.1 Height (in): 64 Pulse (bpm): 78 Weight (lbs): 158 Respiratory Rate (breaths/min): 16 Body Mass Index (BMI): 27.1 Blood Pressure (mmHg): 138/52 Reference Range: 80 - 120 mg / dl Electronic Signature(s) Signed: 01/24/2017 4:52:03 PM By: Alric Quan Entered By: Alric Quan on 01/24/2017 09:51:09

## 2017-01-27 ENCOUNTER — Ambulatory Visit: Payer: Medicare PPO | Admitting: Cardiovascular Disease

## 2017-01-27 NOTE — Progress Notes (Signed)
[name], [name] ([id number]) Visit Report for 01/10/2017 Chief Complaint Document Details Patient Name: [name], [name] Date of Service: 01/10/2017 9:45 AM Medical Record Number: [id number] Patient Account Number: [id number] Date of Birth/Sex: 1919-01-13 (83 y.o. Female) Treating RN: Ahmed Prima Primary Care Provider: Lorelee Market Other Clinician: Referring Provider: Lorelee Market Treating Provider/Extender: Cathie Olden in Treatment: 34 Information Obtained from: Patient Chief Complaint here for follow-up on right lateral foot ulcer Electronic Signature(s) Signed: 01/10/2017 10:18:30 AM By: Lawanda Cousins Entered By: Lawanda Cousins on 01/10/2017 10:18:29 [name] ([id number]) -------------------------------------------------------------------------------- Debridement Details Patient Name: [name] Date of Service: 01/10/2017 9:45 AM Medical Record Number: [id number] Patient Account Number: [id number] Date of Birth/Sex: May 25, 1919 (83 y.o. Female) Treating RN: Carolyne Fiscal, Debi Primary Care Provider: Lorelee Market Other Clinician: Referring Provider: Lorelee Market Treating Provider/Extender: Cathie Olden in Treatment: 39 Debridement Performed for Wound #4 Right,Lateral Foot Assessment: Performed By: Physician Ricard Dillon, MD Debridement: Open Wound/Selective Debridement Selective Description: Pre-procedure Yes - 10:07 Verification/Time Out Taken: Start Time: 10:08 Pain Control: Lidocaine 4% Topical Solution Level: Non-Viable Tissue Total Area Debrided (L x 0.3 (cm) x 0.4 (cm) = 0.12 (cm) W): Tissue and other Non-Viable, Exudate material debrided: Instrument: Blade Bleeding: Minimum Hemostasis Achieved: Pressure End Time: 10:10 Procedural Pain: 0 Post Procedural Pain: 0 Response to Treatment: Procedure was tolerated well Post Debridement Measurements of Total Wound Length: (cm) 0.3 Width: (cm) 0.4 Depth: (cm)  0.2 Volume: (cm) 0.019 Character of Wound/Ulcer Post Requires Further Debridement Debridement: Severity of Tissue Post Debridement: Fat layer exposed Post Procedure Diagnosis Same as Pre-procedure Electronic Signature(s) Signed: 01/10/2017 10:18:07 AM By: Lawanda Cousins Signed: 01/10/2017 5:40:47 PM By: Myriam Jacobson, Demetrius ([id number]) Entered By: Lawanda Cousins on 01/10/2017 10:18:07 [name] ([id number]) -------------------------------------------------------------------------------- HPI Details Patient Name: [name] Date of Service: 01/10/2017 9:45 AM Medical Record Number: [id number] Patient Account Number: [id number] Date of Birth/Sex: 06-13-1919 (83 y.o. Female) Treating RN: Ahmed Prima Primary Care Provider: Lorelee Market Other Clinician: Referring Provider: Lorelee Market Treating Provider/Extender: Cathie Olden in Treatment: 20 History of Present Illness HPI Description: 04/06/16; this is a 83 year old woman who arrives accompanied by 2 daughters for a wound on her left ankle and her left fifth toe. These have apparently been present for a year. I'm not quite certain how she came to this clinic however she was being followed by Sharlotte Alamo her podiatrist for these wounds. She was also referred to [facility] and they apparently did a test presumably arterial studies although we don't have any of these results and we couldn't get through to the office today. The family but has been applying a combination of Bactroban and a light bandage and perhaps more recently Silvadene cream. She did have an x-ray of the foot roughly 6 months ago at the podiatry office the family was unaware that if there were any abnormalities. Apparently they have not seen any healing here. Our intake nurse noted a slight skin tear on the right anterior lower leg. Nobody seemed aware of this. ABIs calculated in this clinic was 0.3 on the right and  0.4 on the left I have reviewed things [facility]. There is very little information on this patient. She apparently follows in current total clinic which we don't have information from. She has mentioned already been to a [facility]. She has a history of hypothyroidism, nephrolithiasis arthritis and has had a previous mastectomy. 04/13/16; patient's x-ray was normal. She is already been to see Dr. Delana Meyer vascular surgery. Her  arterial exam was from November 2016 this showed a left ABI of 0.62 her right of 0.76. Her duplex ultrasound of the left leg showed biphasic waves in the common femoral and distal femoral artery however monophasic waves in the superficial femoral artery proximal and mid biphasic it distal. 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 The patient tells me that she has pain at night when she tries to lie down which is improved by getting up and sitting in the chair this sounds like claudication at rest. Her wounds are on the left medial malleolus and the dorsal fifth toe small punched out wounds that are right on bone. We use Santyl last week 04/20/16: nurse informed me pt has declined evaluation for significant PAD. she denies systemic s/s of infections. 04/27/16;; the patient has had noninvasive arterial studies done in November 2016. ABI and the left was 0.62. Monophasic waves at the superficial femoral artery. Occluded to the posterior tibial artery. So had greater than 50% stenosis of the right superficial femoral and greater than 50% stenosis of the left superficial femoral artery. She had bilateral tibial peroneal artery disease. Both of the wounds on the left fifth toe and left lateral malleolus have been present for more than a year. They have been to see vein and vascular. The patient has some pain but miraculously I think the wounds have largely been stable. No evidence of infection 05/04/16; she goes for a noninvasive study tomorrow and then sees Dr. Fletcher Anon on Monday.  By the time she is here next week we should have a better picture of whether something can be done with regards to her arterial flow. We continue to have ischemic-looking wounds on the left fifth toe and left lateral malleolus. 05/10/16; the patient went for her arterial studies and saw Dr. Fletcher Anon. As predicted she is felt to have critical limb ischemia. The feeling is that she has occlusion of the SFA. The feeling would she would be a candidate for a stent to the SFA. The patient did not make a decision to proceed with the procedure and she is here with family members to discuss this me today. He shouldn't has a lot of pain and cannot sleep [name], [name] ([id number]) and rest well at night per her family. 05/24/16; the patient went and had a complex revascularization/angioplasty of the left superficial femoral artery followed by drug-coated balloon angioplasty and spots stenting. She tolerated the procedure well. She was recommended for dual antiplatelet drugs with Plavix and aspirin for at least a month. She went yesterday for I believe follow-up serial Dopplers and ABIs although I don't see these results. The patient is unfortunately complaining of a lot of pain in her bilateral lower legs below the knees from the ankle to the knees. She apparently was prescribed lidocaine and apparently put this on her legs instead of over the wound areas. This may have something to do with it however there is a lot of edema in her bilateral legs I was able to find her arterial studies from yesterday. The left ABI has improved up to 0.66 post left SFA stent. The bilateral great toe indices remain abnormal with the left being in the 0.25 range. Duplex ultrasound showed her left SFA stent is patent monophasic waveforms persist in the left leg 06/07/16; continued punched-out areas over the dorsal left fifth toe and left lateral malleolus. No major improvement 06/28/16; the areas over her dorsal left fifth toe and  left lateral malleolus are covered in surface slough we  are using Iodoflex 07/05/16. We have been using Iodoflex for 2-3 weeks now. I have not been debridement is because of pain 07/19/16 currently we have been using Iodoflex for roughly the past month. Previously we were unable to debride due to pain although today patient states that the pain is not nearly as severe as it has been in the past. In fact she rates this to be a 1 out of 10 and it worse to a to 10 with palpation of the wound. All and all her and her daughter feel like this is actually doing steadily better at this point in time. She is pleased with progress and we have been seeing her every 2 weeks. 08/02/16; this is a delightful 83 year old woman I have not seen in over a month. She has arterial insufficiency wounds remaining over the left lateral malleolus and the left fifth toe. With the help of Dr. Fletcher Anon we are able to get her revascularized on the left. The 2 wounds on the left have been making good progress and are definitely smaller especially the area over the left lateral malleolus. She is certainly in a lot less pain than she used to be although I still think there is some claudication type pain. Unfortunately she is developed a area on the right lateral foot which is a small open area but I think is threatened. I suspect a small ischemic areas well. Also on her right fifth toe there is an area that is not open but looks as though it is receiving too much pressure from footwear. Finally an area over her right malleolus although I don't think this is on its way to anything ominous 08/17/16 patient presents today for follow up evaluation she tells me that she is really doing fairly well from a pain standpoint compared to where she has been previous. She is tolerating the dressing changes without any complication. She continues to have discharge and drainage however. 08-30-16 Ms. Kowalchuk presents today with her daughter, she  states that she continues to have intermittent shooting pains to the left lateral fifth toe at this site of seems to be a healed ulcer. She denies any other issues that her wound related since her last appointment. Her daughter states that she is in need of a tramadol refill at this time as she uses half a tablet at at bedtime to aid in sleep due to foot pain. Iodoflex has been used on all wounds in previous dressing changes. 09/13/16; the patient has ischemic wounds in her feet. The area over the left lateral malleolus is healed and the area over the left fifth toe looks improved.. She has an area on the lateral aspect of her right foot which is a small but deep wound. Finally she has a new open area on the medial aspect of the left medial malleolus. This is superficial 09/27/16; the area over the left lateral malleolus remains healed. The area over the left fifth toe has a surface and she states the pain is better but I don't think this is completely closed. She has an area on the lateral aspect of her right foot is a small but deep wound. The new open area on the medial aspect of the left ankle was closed from last time. 10/18/16; the area over her left lateral malleolus remains healed. The area over the left fifth toe has a surface Dry, Valjean ([id number]) over the top of this however there is no overt open area. Given the underlying issues of  severe PAD and continued pain in the toe I would think it would be unlikely this is truly healed however I am not planning to debridement this area. The area on the right lateral foot which was a more recent wound is a small punched out painful area again has significant surface slough and nonviable tissue. It is clear the patient still has claudication type pain however she remains functional. I have been giving her tramadol when necessary and that seems to help a lot with her pain the patient follows up with Dr. Fletcher Anon on January 18/18 11/01/16;  patient missed her follow-up with Dr. Fletcher Anon last week due to a snow day. Follow-up is now on February 15. The areas on the right lateral malleolus and dorsal right fifth toe remain closed over. The fifth toe was tentative is there is a surface eschar however I'm not going to disturb this. Therefore, her only open areas on the right lateral foot. This is a small punched out area. We have been using Prisma. I'm quite convinced this is an ischemic wound 11/15/16; patient has follow-up with Dr. Fletcher Anon on February 15. She has no open wound on the left leg and doesn't really complain of claudication that I can determine from talking to her. However on the right leg she clearly has some degree of claudication. The remaining open wound is on the right lateral foot. We have been using Iodosorb ointment 11/29/16; patient saw Dr. Fletcher Anon on 11/24/16. His comment is that her wound on her right lateral foot seems to be improving with local wound care. She has known significant right SFA disease. Her lower extremity Doppler will be repeated and according to the patient's daughter that appointment is on March 15. He is left with the thought that endovascular intervention in the right SFA might be necessary. The patient has quite a bit of pain especially at night related to the wound in her right foot. We changed her to Iodosorb ointment last week 12/13/16; this is a patient I follow every 2 weeks largely on a palliative approach at this point about ischemic wounds currently in the right foot. Initially she had them on her left lateral malleolus and left fifth toe. The area on the left lateral malleolus healed and the fifth toe has a surface eschar that I have elected not to remove. Both of these improved after revascularization by Dr. Fletcher Anon. We have been using Iodosorb to the right lateral foot not much change here. 12/27/16; the patient had her arterial studies. This showed known bilateral SFA disease. Stable right ABI  0.5 to stable left ABI at 0.7 to the did not do it TBI on the right it was 0.61 on the left she has a stent in the long segment of her left SFA from 05/18/16. All of her wounds are somewhat better. She states her pain is better in the right foot is improved. 01/10/17- patient is here for follow-up dilation of her right lateral foot ulcer. She has been tolerating Iodosorb. She is voices no complaints or concerns Electronic Signature(s) Signed: 01/10/2017 10:18:56 AM By: Lawanda Cousins Entered By: Lawanda Cousins on 01/10/2017 10:18:56 [name] ([id number]) -------------------------------------------------------------------------------- Physical Exam Details Patient Name: [name] Date of Service: 01/10/2017 9:45 AM Medical Record Number: [id number] Patient Account Number: [id number] Date of Birth/Sex: 1919/01/06 (83 y.o. Female) Treating RN: Ahmed Prima Primary Care Provider: Lorelee Market Other Clinician: Referring Provider: Lorelee Market Treating Provider/Extender: Cathie Olden in Treatment: 52 Constitutional hypertensive. afebrile. well nourished; well developed;  appears stated age;Marland Kitchen Respiratory non-labored respiratory effort. Cardiovascular RLE- non-palpable DP and PT. Integumentary (Hair, Skin) Right lateral foot ulcer- is unclear posterior room was residual Iodosorb or loosely adherent debris, otherwise granular buds noted in wound base, no periwound erythema, no malodor, no fluctuance, no induration, no. Psychiatric calm, pleasant, conversive. Electronic Signature(s) Signed: 01/10/2017 10:21:02 AM By: Lawanda Cousins Entered By: Lawanda Cousins on 01/10/2017 10:21:02 [name] ([id number]) -------------------------------------------------------------------------------- Physician Orders Details Patient Name: [name] Date of Service: 01/10/2017 9:45 AM Medical Record Number: [id number] Patient Account Number: [id number] Date of Birth/Sex:  [birth year] (83 y.o. Female) Treating RN: Ahmed Prima Primary Care Provider: Lorelee Market Other Clinician: Referring Provider: Lorelee Market Treating Provider/Extender: Cathie Olden in Treatment: 48 Verbal / Phone Orders: Yes Clinician: Carolyne Fiscal, Debi Read Back and Verified: Yes Diagnosis Coding Wound Cleansing Wound #4 Right,Lateral Foot o Clean wound with Normal Saline. - for clinic use o Cleanse wound with mild soap and water o May Shower, gently pat wound dry prior to applying new dressing. Anesthetic Wound #4 Right,Lateral Foot o Topical Lidocaine 4% cream applied to wound bed prior to debridement - for clinic use Skin Barriers/Peri-Wound Care Wound #4 Right,Lateral Foot o Skin Prep Primary Wound Dressing Wound #4 Right,Lateral Foot o Iodosorb Ointment Secondary Dressing Wound #4 Right,Lateral Foot o Dry Gauze o Boardered Foam Dressing Dressing Change Frequency Wound #4 Right,Lateral Foot o Change dressing every other day. Follow-up Appointments Wound #4 Right,Lateral Foot o Return Appointment in 2 weeks. Edema Control Wound #4 Right,Lateral Foot o Elevate legs to the level of the heart and pump ankles as often as possible o Other: - compression stockings Berthelot, Paw ([id number]) Additional Orders / Instructions Wound #4 Right,Lateral Foot o Increase protein intake. Medications-please add to medication list. Wound #4 Right,Lateral Foot o Other: - Vitamin C, Zinc, Multivitamins Tramadol Electronic Signature(s) Signed: 01/10/2017 10:21:20 AM By: Lawanda Cousins Entered By: Lawanda Cousins on 01/10/2017 10:21:19 [name] ([id number]) -------------------------------------------------------------------------------- Problem List Details Patient Name: [name] Date of Service: 01/10/2017 9:45 AM Medical Record Patient Account Number: [id number] Number: Treating RN: Ahmed Prima Apr 06, 1919  (83 y.o. 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 04/06/2016 Yes of other part of foot I70.243 Atherosclerosis of native arteries of left leg with ulceration 04/06/2016 Yes of ankle L97.512 Non-pressure chronic ulcer of other part of right foot with 08/30/2016 Yes fat layer exposed Inactive Problems Resolved Problems Electronic Signature(s) Signed: 01/10/2017 10:17:06 AM By: Lawanda Cousins Signed: 01/26/2017 5:51:58 AM By: [name] MD Entered By: Lawanda Cousins on 01/10/2017 10:17:06 [name] ([id number]) -------------------------------------------------------------------------------- Progress Note Details Patient Name: [name] Date of Service: 01/10/2017 9:45 AM Medical Record Number: [id number] Patient Account Number: [id number] Date of Birth/Sex: July 06, 1919 (83 y.o. Female) Treating RN: Ahmed Prima Primary Care Provider: Lorelee Market Other Clinician: Referring Provider: Lorelee Market Treating Provider/Extender: Cathie Olden in Treatment: 84 Subjective Chief Complaint Information obtained from Patient here for follow-up on right lateral foot ulcer History of Present Illness (HPI) 04/06/16; this is a 83 year old woman who arrives accompanied by 2 daughters for a wound on her left ankle and her left fifth toe. These have apparently been present for a year. I'm not quite certain how she came  to this clinic however she was being followed by Sharlotte Alamo her podiatrist for these wounds. She was also referred to [facility] and they  apparently did a test presumably arterial studies although we don't have any of these results and we couldn't get through to the office today. The family but has been applying a combination of Bactroban and a light bandage and perhaps more recently Silvadene cream. She did have an x-ray of the foot roughly 6 months ago at the podiatry office the family was unaware that if there were any abnormalities. Apparently they have not seen any healing here. Our intake nurse noted a slight skin tear on the right anterior lower leg. Nobody seemed aware of this. ABIs calculated in this clinic was 0.3 on the right and 0.4 on the left I have reviewed things [facility]. There is very little information on this patient. She apparently follows in current total clinic which we don't have information from. She has mentioned already been to a [facility]. She has a history of hypothyroidism, nephrolithiasis arthritis and has had a previous mastectomy. 04/13/16; patient's x-ray was normal. She is already been to see Dr. Delana Meyer vascular surgery. Her arterial exam was from November 2016 this showed a left ABI of 0.62 her right of 0.76. Her duplex ultrasound of the left leg showed biphasic waves in the common femoral and distal femoral artery however monophasic waves in the superficial femoral artery proximal and mid biphasic it distal. 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 The patient tells me that she has pain at night when she tries to lie down which is improved by getting up and sitting in the chair this sounds like claudication at rest. Her wounds are on the left medial malleolus and the dorsal fifth toe small punched out wounds that are right on bone. We use Santyl last week 04/20/16: nurse informed me pt has declined evaluation for significant PAD. she denies systemic s/s of infections. 04/27/16;; the patient has had noninvasive arterial studies done in November 2016. ABI and the left was 0.62. Monophasic  waves at the superficial femoral artery. Occluded to the posterior tibial artery. So had greater than 50% stenosis of the right superficial femoral and greater than 50% stenosis of the left superficial femoral artery. She had bilateral tibial peroneal artery disease. Both of the wounds on the left fifth toe and left lateral malleolus have been present for more than a year. They have been to see vein and vascular. The patient has some pain but miraculously I think the wounds have largely been stable. No evidence of infection 05/04/16; she goes for a noninvasive study tomorrow and then sees Dr. Fletcher Anon on Monday. By the time she is [name], [name] ([id number]) here next week we should have a better picture of whether something can be done with regards to her arterial flow. We continue to have ischemic-looking wounds on the left fifth toe and left lateral malleolus. 05/10/16; the patient went for her arterial studies and saw Dr. Fletcher Anon. As predicted she is felt to have critical limb ischemia. The feeling is that she has occlusion of the SFA. The feeling would she would be a candidate for a stent to the SFA. The patient did not make a decision to proceed with the procedure and she is here with family members to discuss this me today. He shouldn't has a lot of pain and cannot sleep and rest well at night per her family. 05/24/16; the patient went and had  a complex revascularization/angioplasty of the left superficial femoral artery followed by drug-coated balloon angioplasty and spots stenting. She tolerated the procedure well. She was recommended for dual antiplatelet drugs with Plavix and aspirin for at least a month. She went yesterday for I believe follow-up serial Dopplers and ABIs although I don't see these results. The patient is unfortunately complaining of a lot of pain in her bilateral lower legs below the knees from the ankle to the knees. She apparently was prescribed lidocaine and apparently put  this on her legs instead of over the wound areas. This may have something to do with it however there is a lot of edema in her bilateral legs I was able to find her arterial studies from yesterday. The left ABI has improved up to 0.66 post left SFA stent. The bilateral great toe indices remain abnormal with the left being in the 0.25 range. Duplex ultrasound showed her left SFA stent is patent monophasic waveforms persist in the left leg 06/07/16; continued punched-out areas over the dorsal left fifth toe and left lateral malleolus. No major improvement 06/28/16; the areas over her dorsal left fifth toe and left lateral malleolus are covered in surface slough we are using Iodoflex 07/05/16. We have been using Iodoflex for 2-3 weeks now. I have not been debridement is because of pain 07/19/16 currently we have been using Iodoflex for roughly the past month. Previously we were unable to debride due to pain although today patient states that the pain is not nearly as severe as it has been in the past. In fact she rates this to be a 1 out of 10 and it worse to a to 10 with palpation of the wound. All and all her and her daughter feel like this is actually doing steadily better at this point in time. She is pleased with progress and we have been seeing her every 2 weeks. 08/02/16; this is a delightful 83 year old woman I have not seen in over a month. She has arterial insufficiency wounds remaining over the left lateral malleolus and the left fifth toe. With the help of Dr. Fletcher Anon we are able to get her revascularized on the left. The 2 wounds on the left have been making good progress and are definitely smaller especially the area over the left lateral malleolus. She is certainly in a lot less pain than she used to be although I still think there is some claudication type pain. Unfortunately she is developed a area on the right lateral foot which is a small open area but I think is threatened. I suspect  a small ischemic areas well. Also on her right fifth toe there is an area that is not open but looks as though it is receiving too much pressure from footwear. Finally an area over her right malleolus although I don't think this is on its way to anything ominous 08/17/16 patient presents today for follow up evaluation she tells me that she is really doing fairly well from a pain standpoint compared to where she has been previous. She is tolerating the dressing changes without any complication. She continues to have discharge and drainage however. 08-30-16 Ms. Hashman presents today with her daughter, she states that she continues to have intermittent shooting pains to the left lateral fifth toe at this site of seems to be a healed ulcer. She denies any other issues that her wound related since her last appointment. Her daughter states that she is in need of a tramadol refill at  this time as she uses half a tablet at at bedtime to aid in sleep due to foot pain. Iodoflex has been used on all wounds in previous dressing changes. 09/13/16; the patient has ischemic wounds in her feet. The area over the left lateral malleolus is healed and the area over the left fifth toe looks improved.. She has an area on the lateral aspect of her right foot which is a small but deep wound. Finally she has a new open area on the medial aspect of the left medial Mallozzi, [name] ([id number]) malleolus. This is superficial 09/27/16; the area over the left lateral malleolus remains healed. The area over the left fifth toe has a surface and she states the pain is better but I don't think this is completely closed. She has an area on the lateral aspect of her right foot is a small but deep wound. The new open area on the medial aspect of the left ankle was closed from last time. 10/18/16; the area over her left lateral malleolus remains healed. The area over the left fifth toe has a surface over the top of this however there  is no overt open area. Given the underlying issues of severe PAD and continued pain in the toe I would think it would be unlikely this is truly healed however I am not planning to debridement this area. The area on the right lateral foot which was a more recent wound is a small punched out painful area again has significant surface slough and nonviable tissue. It is clear the patient still has claudication type pain however she remains functional. I have been giving her tramadol when necessary and that seems to help a lot with her pain the patient follows up with Dr. Fletcher Anon on January 18/18 11/01/16; patient missed her follow-up with Dr. Fletcher Anon last week due to a snow day. Follow-up is now on February 15. The areas on the right lateral malleolus and dorsal right fifth toe remain closed over. The fifth toe was tentative is there is a surface eschar however I'm not going to disturb this. Therefore, her only open areas on the right lateral foot. This is a small punched out area. We have been using Prisma. I'm quite convinced this is an ischemic wound 11/15/16; patient has follow-up with Dr. Fletcher Anon on February 15. She has no open wound on the left leg and doesn't really complain of claudication that I can determine from talking to her. However on the right leg she clearly has some degree of claudication. The remaining open wound is on the right lateral foot. We have been using Iodosorb ointment 11/29/16; patient saw Dr. Fletcher Anon on 11/24/16. His comment is that her wound on her right lateral foot seems to be improving with local wound care. She has known significant right SFA disease. Her lower extremity Doppler will be repeated and according to the patient's daughter that appointment is on March 15. He is left with the thought that endovascular intervention in the right SFA might be necessary. The patient has quite a bit of pain especially at night related to the wound in her right foot. We changed her to  Iodosorb ointment last week 12/13/16; this is a patient I follow every 2 weeks largely on a palliative approach at this point about ischemic wounds currently in the right foot. Initially she had them on her left lateral malleolus and left fifth toe. The area on the left lateral malleolus healed and the fifth toe has a surface  eschar that I have elected not to remove. Both of these improved after revascularization by Dr. Fletcher Anon. We have been using Iodosorb to the right lateral foot not much change here. 12/27/16; the patient had her arterial studies. This showed known bilateral SFA disease. Stable right ABI 0.5 to stable left ABI at 0.7 to the did not do it TBI on the right it was 0.61 on the left she has a stent in the long segment of her left SFA from 05/18/16. All of her wounds are somewhat better. She states her pain is better in the right foot is improved. 01/10/17- patient is here for follow-up dilation of her right lateral foot ulcer. She has been tolerating Iodosorb. She is voices no complaints or concerns Objective Constitutional hypertensive. afebrile. well nourished; well developed; appears stated age;Marland Kitchen [name], [name] ([id number]) Vitals Time Taken: 9:54 AM, Height: 64 in, Weight: 158 lbs, BMI: 27.1, Temperature: 97.5 F, Pulse: 68 bpm, Respiratory Rate: 16 breaths/min, Blood Pressure: 160/48 mmHg. Respiratory non-labored respiratory effort. Cardiovascular RLE- non-palpable DP and PT. Psychiatric calm, pleasant, conversive. Integumentary (Hair, Skin) Right lateral foot ulcer- is unclear posterior room was residual Iodosorb or loosely adherent debris, otherwise granular buds noted in wound base, no periwound erythema, no malodor, no fluctuance, no induration, no. Wound #4 status is Open. Original cause of wound was Gradually Appeared. The wound is located on the Right,Lateral Foot. The wound measures 0.3cm length x 0.4cm width x 0.1cm depth; 0.094cm^2 area and 0.009cm^3 volume. The  wound is limited to skin breakdown. There is no tunneling or undermining noted. There is a large amount of serous drainage noted. The wound margin is distinct with the outline attached to the wound base. There is medium (34-66%) pink granulation within the wound bed. There is a medium (34- 66%) amount of necrotic tissue within the wound bed including Adherent Slough. Periwound temperature was noted as No Abnormality. The periwound has tenderness on palpation. 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 There was a Non-Viable Tissue Open Wound/Selective [phone]) debridement with total area of 0.12 sq cm performed by Ricard Dillon, MD. with the following instrument(s): Blade to remove Non-Viable tissue/material including Exudate after achieving pain control using Lidocaine 4% Topical Solution. A time out was conducted at 10:07, prior to the start of the procedure. A Minimum amount of bleeding was controlled with Pressure. The procedure was tolerated well with a pain level of 0 throughout and a pain level of 0 following the procedure. Post Debridement Measurements: 0.3cm length x 0.4cm width x 0.2cm depth; 0.019cm^3 volume. Character of Wound/Ulcer Post Debridement requires further debridement. Severity of Tissue Post Debridement is: Fat layer exposed. Post procedure Diagnosis Wound #4: Same as Pre-Procedure Plan Wound Cleansing: Wound #4 Right,Lateral Foot: Clean wound with Normal Saline. - for clinic use Cleanse  wound with mild soap and water May Shower, gently pat wound dry prior to applying new dressing. Anesthetic: Wound #4 Right,Lateral Foot: Topical Lidocaine 4% cream applied to wound bed prior to debridement - for clinic use Skin Barriers/Peri-Wound Care: Wound #4 Right,Lateral Foot: Skin  Prep Primary Wound Dressing: Wound #4 Right,Lateral Foot: Iodosorb Ointment Secondary Dressing: Wound #4 Right,Lateral Foot: Dry Gauze Boardered Foam Dressing Dressing Change Frequency: Wound #4 Right,Lateral Foot: Change dressing every other day. Follow-up Appointments: Wound #4 Right,Lateral Foot: Return Appointment in 2 weeks. Edema Control: Wound #4 Right,Lateral Foot: Elevate legs to the level of the heart and pump ankles as often as possible Other: - compression stockings Additional Orders / Instructions: Wound #4 Right,Lateral Foot: Increase protein intake. [name], [name] ([id number]) Medications-please add to medication list.: Wound #4 Right,Lateral Foot: Other: - Vitamin C, Zinc, Multivitamins Tramadol 1. Continue with Iodosorb, change every other day next line 2. Follow-up in 2 weeks Electronic Signature(s) Signed: 01/10/2017 10:21:51 AM By: Lawanda Cousins Entered By: Lawanda Cousins on 01/10/2017 10:21:51 [name] ([id number]) -------------------------------------------------------------------------------- SuperBill Details Patient Name: [name] Date of Service: 01/10/2017 Medical Record Number: [id number] Patient Account Number: [id number] Date of Birth/Sex: 1918/12/06 (83 y.o. Female) Treating RN: Carolyne Fiscal, Debi Primary Care Provider: Lorelee Market Other Clinician: Referring Provider: Lorelee Market Treating Provider/Extender: Cathie Olden in Treatment: 39 Diagnosis Coding ICD-10 Codes Code Description [phone] Non-pressure chronic ulcer of other part of left foot with necrosis of muscle I70.245 Atherosclerosis of native arteries of left leg  with ulceration of other part of foot I70.243 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 L97.512 Non-pressure chronic ulcer of other part of right foot with fat layer exposed Facility Procedures CPT4 Code Description: [id number] - DEBRIDE WOUND 1ST 20 SQ CM OR < ICD-10 Description Diagnosis L97.512 Non-pressure chronic ulcer of other part of right foot with Modifier: fat layer ex Quantity: 1 posed Physician Procedures CPT4 Code Description: [id number] - WC PHYS DEBR WO ANESTH 20 SQ CM ICD-10 Description Diagnosis L97.512 Non-pressure chronic ulcer of other part of right foot with Modifier: fat layer ex Quantity: 1 posed Electronic Signature(s) Signed: 01/10/2017 10:22:01 AM By: Lawanda Cousins Entered By: Lawanda Cousins on 01/10/2017 10:22:01

## 2017-02-06 ENCOUNTER — Ambulatory Visit (INDEPENDENT_AMBULATORY_CARE_PROVIDER_SITE_OTHER): Payer: Medicare PPO | Admitting: Cardiovascular Disease

## 2017-02-06 ENCOUNTER — Encounter: Payer: Self-pay | Admitting: Cardiovascular Disease

## 2017-02-06 VITALS — BP 152/60 | HR 74 | Ht 63.0 in | Wt 144.2 lb

## 2017-02-06 DIAGNOSIS — I10 Essential (primary) hypertension: Secondary | ICD-10-CM

## 2017-02-06 DIAGNOSIS — E785 Hyperlipidemia, unspecified: Secondary | ICD-10-CM | POA: Diagnosis not present

## 2017-02-06 DIAGNOSIS — I739 Peripheral vascular disease, unspecified: Secondary | ICD-10-CM

## 2017-02-06 NOTE — Patient Instructions (Signed)
Medication Instructions: Continue same medications.   Labwork: None.   Procedures/Testing: None.   Follow-Up: 3 months with Dr. Arida.   Any Additional Special Instructions Will Be Listed Below (If Applicable).     If you need a refill on your cardiac medications before your next appointment, please call your pharmacy.   

## 2017-02-06 NOTE — Progress Notes (Signed)
Cardiology Office Note   Date:  02/06/2017   ID:  [name], DOB 1919-03-06, MRN [id number]  PCP:  Lorelee Market, MD  Cardiologist:   [name], MD   Chief Complaint  Patient presents with  . OTHER    2 month f/u no complaints today. Meds reviewed verbally with pt.      History of Present Illness: [name] is a 83 y.o. female who Is here today for a follow-up visit regarding peripheral arterial disease. The patient has no previous cardiac history and no history of diabetes or tobacco use. She is known to have hypertension, hyperlipidemia and hypothyroidism.   She was seen last year for nonhealing wounds on the left foot.  Noninvasive vascular evaluation showed an ABI of 0.55 on the right and 0.40 on the left. Duplex showed relatively long occlusion of the mid to distal left SFA with two-vessel runoff below the knee. Angiography in August, 2017 showed no significant aortoiliac disease. There was long occlusion of the left SFA with 1 vessel runoff below the knee via the peroneal artery which was occluded but gave collaterals to the dorsalis pedis. I performed successful angioplasty of the left SFA followed by drug-coated balloon angioplasty and spot stenting with self-expanding stent in the midsegment.  The ulcers on the left foot has healed completely.  She developed a small ulceration on the right lateral foot. It is still very superficial and seems to be improving pH she denies any significant pain.  Vascular studies last month showed further improvement in ABI on the left side to 0.72. She does have evidence of severe distal right SFA stenosis.   Past Medical History:  Diagnosis Date  . Arthritis    "legs" 05/18/2016)  . Breast cancer, right breast ([place])    mastectomy 30 yr ago  . DVT (deep venous thrombosis) ([place])    "found an old clot in her ?left leg when they were doing vascular studies; put her on ASA" (05/18/2016)  . Family history of adverse  reaction to anesthesia    "all the family get PONV" (05/18/2016)  . Hypertension   . Hypothyroidism   . kidney calculi   . PAD (peripheral artery disease) ([place])   . PONV (postoperative nausea and vomiting)   . Shortness of breath     Past Surgical History:  Procedure Laterality Date  . BALLOON ANGIOPLASTY, ARTERY Left 05/18/2016   SFA/notes 05/18/2016  . BREAST BIOPSY Right   . CATARACT EXTRACTION W/ INTRAOCULAR LENS  IMPLANT, BILATERAL Bilateral 2014  . CYSTOSCOPY W/ LITHOLAPAXY / [facility]    . CYSTOSCOPY W/ URETERAL STENT PLACEMENT  01/17/2012   Procedure: CYSTOSCOPY WITH RETROGRADE PYELOGRAM/URETERAL STENT PLACEMENT;  Surgeon: Franchot Gallo, MD;  Location: [facility];  Service: Urology;  Laterality: Left;  . CYSTOSCOPY W/ URETERAL STENT PLACEMENT  11/10/2010   Archie Endo 11/10/2010  . CYSTOSCOPY W/ URETERAL STENT REMOVAL  11/22/2010   Archie Endo 11/22/2010  . EYE SURGERY    . FRACTURE SURGERY    . MASTECTOMY COMPLETE / SIMPLE Right   . PERIPHERAL VASCULAR CATHETERIZATION N/A 05/18/2016   Procedure: Abdominal Aortogram w/Lower Extremity;  Surgeon: Wellington Hampshire, MD;  Location: [facility];  Service: Cardiovascular;  Laterality: N/A;  . WRIST FRACTURE SURGERY Left      Current Outpatient Prescriptions  Medication Sig Dispense Refill  . amLODipine (NORVASC) 5 MG tablet Take 5 mg by mouth daily with breakfast.     . cetirizine (ZYRTEC) 10 MG tablet Take 10  mg by mouth daily.    . clopidogrel (PLAVIX) 75 MG tablet Take 1 tablet (75 mg total) by mouth daily with breakfast. 30 tablet 5  . furosemide (LASIX) 20 MG tablet Take 1 tablet (20 mg total) by mouth daily. For 5 days 5 tablet 0  . levothyroxine (SYNTHROID, LEVOTHROID) 50 MCG tablet Take 50 mcg by mouth daily with breakfast.     . lidocaine-prilocaine (EMLA) cream Apply 1 application topically as needed. Dressing change    . losartan (COZAAR) 50 MG tablet Take 50 mg by mouth daily with breakfast.     . nortriptyline (PAMELOR) 25 MG  capsule Take 1 capsule (25 mg total) by mouth at bedtime. 30 capsule 1  . potassium chloride SA (K-DUR,KLOR-CON) 20 MEQ tablet Take 1 tablet (20 mEq total) by mouth daily. For 5 days 5 tablet 0   No current facility-administered medications for this visit.     Allergies:   Codeine    Social History:  The patient  reports that she has never smoked. She has never used smokeless tobacco. She reports that she does not drink alcohol or use drugs.   Family History:  The patient's Family history is remarkable for coronary artery disease.   ROS:  Please see the history of present illness.   Otherwise, review of systems are positive for none.   All other systems are reviewed and negative.    PHYSICAL EXAM: VS:  BP (!) 152/60 (BP Location: Left Arm, Patient Position: Sitting, Cuff Size: Normal)   Pulse 74   Ht 5\' 3"  (1.6 m)   Wt 144 lb 4 oz (65.4 kg)   BMI 25.55 kg/m  , BMI Body mass index is 25.55 kg/m. GEN: Well nourished, well developed, in no acute distress  HEENT: normal  Neck: no JVD, carotid bruits, or masses Cardiac: RRR; no murmurs, rubs, or gallops,no edema  Respiratory:  clear to auscultation bilaterally, normal work of breathing GI: soft, nontender, nondistended, + BS MS: no deformity or atrophy  Skin: warm and dry, no rash Neuro:  Strength and sensation are intact Psych: euthymic mood, full affect Vascular: Femoral pulses are normal bilaterally. Distal pulses are not palpable.   EKG:  EKG is  ordered today. EKG showed normal sinus rhythm with left anterior fascicular block, borderline LVH.  Recent Labs: 05/16/2016: BUN 17; Potassium 3.5; Sodium 142 05/18/2016: Creatinine, Ser 0.63; Hemoglobin 12.6; Platelets 273    Lipid Panel No results found for: CHOL, TRIG, HDL, CHOLHDL, VLDL, LDLCALC, LDLDIRECT    Wt Readings from Last 3 Encounters:  02/06/17 144 lb 4 oz (65.4 kg)  11/24/16 147 lb (66.7 kg)  07/14/16 148 lb (67.1 kg)      Other studies Reviewed: Additional  studies/ records that were reviewed today include: Office note from the wound center.    ASSESSMENT AND PLAN:  1.  Peripheral arterial disease: Previous critical limb ischemia affecting the left foot. Status post successful angioplasty to the left SFA with stent placement.  No residual ulceration on the left side. She does have small superficial ulcer on the right lateral ankle with known severe right SFA disease. However, there has been no progression and it's almost completely healed. Considering her age, we have elected for conservative management and avoiding invasive procedures at the present time unless absolutely necessary. I am going to keep her on clopidogrel long-term. Continue follow-up at the wound center in follow-up with me in 3 months.  2. Essential hypertension: Blood pressure is  elevated. Continue  to monitor.  3. Hyperlipidemia: Given peripheral arterial disease, treatment with a statin should be considered although the benefit is not conclusive in this age group.  Disposition:   FU with me in 3 months  Signed,  [name], MD  02/06/2017 1:38 PM    [place]

## 2017-02-07 ENCOUNTER — Encounter: Payer: Medicare PPO | Attending: Internal Medicine | Admitting: Internal Medicine

## 2017-02-07 DIAGNOSIS — I70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: Secondary | ICD-10-CM | POA: Insufficient documentation

## 2017-02-07 DIAGNOSIS — I70243 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: Secondary | ICD-10-CM | POA: Diagnosis not present

## 2017-02-07 DIAGNOSIS — L97523 Non-pressure chronic ulcer of other part of left foot with necrosis of muscle: Secondary | ICD-10-CM | POA: Insufficient documentation

## 2017-02-07 DIAGNOSIS — E039 Hypothyroidism, unspecified: Secondary | ICD-10-CM | POA: Diagnosis not present

## 2017-02-07 DIAGNOSIS — M1990 Unspecified osteoarthritis, unspecified site: Secondary | ICD-10-CM | POA: Diagnosis not present

## 2017-02-07 DIAGNOSIS — L97512 Non-pressure chronic ulcer of other part of right foot with fat layer exposed: Secondary | ICD-10-CM | POA: Insufficient documentation

## 2017-02-09 ENCOUNTER — Ambulatory Visit: Payer: Medicare PPO | Admitting: Cardiovascular Disease

## 2017-02-09 NOTE — Progress Notes (Signed)
[name], [name] ([id number]) Visit Report for 02/07/2017 Chief Complaint Document Details Patient Name: [name], [name] Date of Service: 02/07/2017 9:45 AM Medical Record Patient Account Number: [id number] Number: Treating RN: Ahmed Prima 23-Oct-1918 (83 y.o. Other Clinician: Date of Birth/Sex: Female) Treating Ezekiel Menzer Primary Care Provider: Lorelee Market Provider/Extender: G Referring Provider: Vinson Moselle in Treatment: 56 Information Obtained from: Patient Chief Complaint here for follow-up on right lateral foot ulcer Electronic Signature(s) Signed: 02/08/2017 7:53:49 AM By: [name] MD Entered By: [name] on 02/07/2017 10:23:13 [name] ([id number]) -------------------------------------------------------------------------------- HPI Details Patient Name: [name] Date of Service: 02/07/2017 9:45 AM Medical Record Patient Account Number: [id number] Number: Treating RN: Ahmed Prima 09-Jun-1919 (83 y.o. Other Clinician: Date of Birth/Sex: Female) Treating Anahy Esh Primary Care Provider: Lorelee Market Provider/Extender: G Referring Provider: Vinson Moselle in Treatment: 50 History of Present Illness HPI Description: 04/06/16; this is a 83 year old woman who arrives accompanied by 2 daughters for a wound on her left ankle and her left fifth toe. These have apparently been present for a year. I'm not quite certain how she came to this clinic however she was being followed by Sharlotte Alamo her podiatrist for these wounds. She was also referred to [facility] and they apparently did a test presumably arterial studies although we don't have any of these results and we couldn't get through to the office today. The family but has been applying a combination of Bactroban and a light bandage and perhaps more recently Silvadene cream. She did have an x-ray of the foot roughly 6  months ago at the podiatry office the family was unaware that if there were any abnormalities. Apparently they have not seen any healing here. Our intake nurse noted a slight skin tear on the right anterior lower leg. Nobody seemed aware of this. ABIs calculated in this clinic was 0.3 on the right and 0.4 on the left I have reviewed things [facility]. There is very little information on this patient. She apparently follows in current total clinic which we don't have information from. She has mentioned already been to a [facility]. She has a history of hypothyroidism, nephrolithiasis arthritis and has had a previous mastectomy. 04/13/16; patient's x-ray was normal. She is already been to see Dr. Delana Meyer vascular surgery. Her arterial exam was from November 2016 this showed a left ABI of 0.62 her right of 0.76. Her duplex ultrasound of the left leg showed biphasic waves in the common femoral and distal femoral artery however monophasic waves in the superficial femoral artery proximal and mid biphasic it distal. 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 The patient tells me that she has pain at night when she tries to lie down which is improved by getting up and sitting in the chair this sounds like claudication at rest. Her wounds are on the left medial malleolus and the dorsal fifth toe small punched out wounds that are right on bone. We use Santyl last week 04/20/16: nurse informed me pt has declined evaluation for significant PAD. she denies systemic s/s of infections. 04/27/16;; the patient has had noninvasive arterial studies done in November 2016. ABI and the left was 0.62. Monophasic waves at the superficial femoral artery. Occluded to the posterior tibial artery. So had greater than 50% stenosis of the right superficial femoral and greater than 50% stenosis of the left superficial femoral artery. She had bilateral tibial peroneal artery disease. Both of the wounds on the left fifth toe and  left lateral malleolus have been present for more than a year. They have been to see vein and vascular. The patient has some pain but miraculously I think the wounds have largely been stable. No evidence of infection 05/04/16; she goes for a noninvasive study tomorrow and then sees Dr. Fletcher Anon on Monday. By the time she is here next week we should have a better picture of whether something can be done with regards to her arterial flow. We continue to have ischemic-looking wounds on the left fifth toe and left lateral malleolus. 05/10/16; the patient went for her arterial studies and saw Dr. Fletcher Anon. As predicted she is felt to have critical limb ischemia. The feeling is that she has occlusion of the SFA. The feeling would she would be a [place], Loralie ([id number]) candidate for a stent to the SFA. The patient did not make a decision to proceed with the procedure and she is here with family members to discuss this me today. He shouldn't has a lot of pain and cannot sleep and rest well at night per her family. 05/24/16; the patient went and had a complex revascularization/angioplasty of the left superficial femoral artery followed by drug-coated balloon angioplasty and spots stenting. She tolerated the procedure well. She was recommended for dual antiplatelet drugs with Plavix and aspirin for at least a month. She went yesterday for I believe follow-up serial Dopplers and ABIs although I don't see these results. The patient is unfortunately complaining of a lot of pain in her bilateral lower legs below the knees from the ankle to the knees. She apparently was prescribed lidocaine and apparently put this on her legs instead of over the wound areas. This may have something to do with it however there is a lot of edema in her bilateral legs I was able to find her arterial studies from yesterday. The left ABI has improved up to 0.66 post left SFA stent. The bilateral great toe indices remain abnormal with the  left being in the 0.25 range. Duplex ultrasound showed her left SFA stent is patent monophasic waveforms persist in the left leg 06/07/16; continued punched-out areas over the dorsal left fifth toe and left lateral malleolus. No major improvement 06/28/16; the areas over her dorsal left fifth toe and left lateral malleolus are covered in surface slough we are using Iodoflex 07/05/16. We have been using Iodoflex for 2-3 weeks now. I have not been debridement is because of pain 07/19/16 currently we have been using Iodoflex for roughly the past month. Previously we were unable to debride due to pain although today patient states that the pain is not nearly as severe as it has been in the past. In fact she rates this to be a 1 out of 10 and it worse to a to 10 with palpation of the wound. All and all her and her daughter feel like this is actually doing steadily better at this point in time. She is pleased with progress and we have been seeing her every 2 weeks. 08/02/16; this is a delightful 83 year old woman I have not seen in over a month. She has arterial insufficiency wounds remaining over the left lateral malleolus and the left fifth toe. With the help of Dr. Fletcher Anon we are able to get her revascularized on the left. The 2 wounds on the left have been making good progress and are definitely smaller especially the area over the left lateral malleolus. She is certainly in a lot less pain than she used to be  although I still think there is some claudication type pain. Unfortunately she is developed a area on the right lateral foot which is a small open area but I think is threatened. I suspect a small ischemic areas well. Also on her right fifth toe there is an area that is not open but looks as though it is receiving too much pressure from footwear. Finally an area over her right malleolus although I don't think this is on its way to anything ominous 08/17/16 patient presents today for follow up  evaluation she tells me that she is really doing fairly well from a pain standpoint compared to where she has been previous. She is tolerating the dressing changes without any complication. She continues to have discharge and drainage however. 08-30-16 Ms. Felten presents today with her daughter, she states that she continues to have intermittent shooting pains to the left lateral fifth toe at this site of seems to be a healed ulcer. She denies any other issues that her wound related since her last appointment. Her daughter states that she is in need of a tramadol refill at this time as she uses half a tablet at at bedtime to aid in sleep due to foot pain. Iodoflex has been used on all wounds in previous dressing changes. 09/13/16; the patient has ischemic wounds in her feet. The area over the left lateral malleolus is healed and the area over the left fifth toe looks improved.. She has an area on the lateral aspect of her right foot which is a small but deep wound. Finally she has a new open area on the medial aspect of the left medial malleolus. This is superficial 09/27/16; the area over the left lateral malleolus remains healed. The area over the left fifth toe has a surface and she states the pain is better but I don't think this is completely closed. She has an area on the lateral aspect of her right foot is a small but deep wound. The new open area on the medial aspect of the [facility], Deeann ([id number]) left ankle was closed from last time. 10/18/16; the area over her left lateral malleolus remains healed. The area over the left fifth toe has a surface over the top of this however there is no overt open area. Given the underlying issues of severe PAD and continued pain in the toe I would think it would be unlikely this is truly healed however I am not planning to debridement this area. The area on the right lateral foot which was a more recent wound is a small punched out painful area  again has significant surface slough and nonviable tissue. It is clear the patient still has claudication type pain however she remains functional. I have been giving her tramadol when necessary and that seems to help a lot with her pain the patient follows up with Dr. Fletcher Anon on January 18/18 11/01/16; patient missed her follow-up with Dr. Fletcher Anon last week due to a snow day. Follow-up is now on February 15. The areas on the right lateral malleolus and dorsal right fifth toe remain closed over. The fifth toe was tentative is there is a surface eschar however I'm not going to disturb this. Therefore, her only open areas on the right lateral foot. This is a small punched out area. We have been using Prisma. I'm quite convinced this is an ischemic wound 11/15/16; patient has follow-up with Dr. Fletcher Anon on February 15. She has no open wound on the left leg and  doesn't really complain of claudication that I can determine from talking to her. However on the right leg she clearly has some degree of claudication. The remaining open wound is on the right lateral foot. We have been using Iodosorb ointment 11/29/16; patient saw Dr. Fletcher Anon on 11/24/16. His comment is that her wound on her right lateral foot seems to be improving with local wound care. She has known significant right SFA disease. Her lower extremity Doppler will be repeated and according to the patient's daughter that appointment is on March 15. He is left with the thought that endovascular intervention in the right SFA might be necessary. The patient has quite a bit of pain especially at night related to the wound in her right foot. We changed her to Iodosorb ointment last week 12/13/16; this is a patient I follow every 2 weeks largely on a palliative approach at this point about ischemic wounds currently in the right foot. Initially she had them on her left lateral malleolus and left fifth toe. The area on the left lateral malleolus healed and the fifth  toe has a surface eschar that I have elected not to remove. Both of these improved after revascularization by Dr. Fletcher Anon. We have been using Iodosorb to the right lateral foot not much change here. 12/27/16; the patient had her arterial studies. This showed known bilateral SFA disease. Stable right ABI 0.5 to stable left ABI at 0.7 to the did not do it TBI on the right it was 0.61 on the left she has a stent in the long segment of her left SFA from 05/18/16. All of her wounds are somewhat better. She states her pain is better in the right foot is improved. 01/10/17- patient is here for follow-up dilation of her right lateral foot ulcer. She has been tolerating Iodosorb. She is voices no complaints or concerns 01/24/17; small ischemic wound on right lateral foot. still non viable cover. follows with Dr. Fletcher Anon on 4/30. No open area on left foot 02/07/17 doing well and states she is pain free. Saw Dr. Fletcher Anon who said she is doing well and has "50% flow in the right leg and 70% in the left. According to her daughter he does not wish to do any further interventions Electronic Signature(s) Signed: 02/08/2017 7:53:49 AM By: [name] MD Entered By: [name] on 02/07/2017 10:24:52 [name] ([id number]) -------------------------------------------------------------------------------- Physical Exam Details Patient Name: [name] Date of Service: 02/07/2017 9:45 AM Medical Record Patient Account Number: [id number] Number: Treating RN: Ahmed Prima [birth year] (83 y.o. Other Clinician: Date of Birth/Sex: Female) Treating Tahjae Durr Primary Care Provider: Lorelee Market Provider/Extender: G Referring Provider: Vinson Moselle in Treatment: 21 Constitutional Patient is hypertensive.. Pulse regular and within target range for patient.Marland Kitchen Respirations regular, non-labored and within target range.. Temperature is normal and within the target range for the  patient.. Patient's appearance is neat and clean. Appears in no acute distress. Well nourished and well developed.. Eyes Conjunctivae clear. No discharge.Marland Kitchen Respiratory Respiratory effort is easy and symmetric bilaterally. Rate is normal at rest and on room air.. Cardiovascular faint dorsalis pedis pulse. Lymphatic none palpable in popliteal or inguinal area. Psychiatric No evidence of depression, anxiety, or agitation. Calm, cooperative, and communicative. Appropriate interactions and affect.. Notes wound exam; very small open area on the lateral left foot no drainage,no erythema. Electronic Signature(s) Signed: 02/08/2017 7:53:49 AM By: [name] MD Entered By: [name] on 02/07/2017 10:31:48 [name] ([id number]) -------------------------------------------------------------------------------- Physician Orders Details Patient Name: [name],  [name] Date of Service: 02/07/2017 9:45 AM Medical Record Patient Account Number: [id number] Number: Treating RN: Ahmed Prima December 01, 1918 (83 y.o. Other Clinician: Date of Birth/Sex: Female) Treating Rawlin Reaume Primary Care Provider: Lorelee Market Provider/Extender: G Referring Provider: Vinson Moselle in Treatment: 94 Verbal / Phone Orders: Yes Clinician: Carolyne Fiscal, Debi Read Back and Verified: Yes Diagnosis Coding Wound Cleansing Wound #4 Right,Lateral Foot o Clean wound with Normal Saline. - for clinic use o Cleanse wound with mild soap and water o May Shower, gently pat wound dry prior to applying new dressing. Anesthetic Wound #4 Right,Lateral Foot o Topical Lidocaine 4% cream applied to wound bed prior to debridement - for clinic use Skin Barriers/Peri-Wound Care Wound #4 Right,Lateral Foot o Skin Prep Primary Wound Dressing Wound #4 Right,Lateral Foot o Iodosorb Ointment Secondary Dressing Wound #4 Right,Lateral Foot o Dry Gauze o Boardered Foam  Dressing Dressing Change Frequency Wound #4 Right,Lateral Foot o Change dressing every other day. Follow-up Appointments Wound #4 Right,Lateral Foot o Return Appointment in 2 weeks. Edema Control Wound #4 Right,Lateral Foot Proehl, Fawne ([id number]) o Elevate legs to the level of the heart and pump ankles as often as possible o Other: - compression stockings Additional Orders / Instructions Wound #4 Right,Lateral Foot o Increase protein intake. Medications-please add to medication list. Wound #4 Right,Lateral Foot o Other: - Vitamin C, Zinc, Multivitamins Tramadol Electronic Signature(s) Signed: 02/07/2017 4:41:02 PM By: Alric Quan Signed: 02/08/2017 7:53:49 AM By: [name] MD Entered By: Alric Quan on 02/07/2017 10:19:41 [name] ([id number]) -------------------------------------------------------------------------------- Problem List Details Patient Name: [name] Date of Service: 02/07/2017 9:45 AM Medical Record Patient Account Number: [id number] Number: Treating RN: Ahmed Prima [birth year] (83 y.o. 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 Other Clinician: Date of Birth/Sex: Female) Treating Shiza Thelen, Roby Primary Care Provider: Lorelee Market Provider/Extender: G Referring Provider: Vinson Moselle in Treatment: 67 Subjective Chief Complaint Information obtained from Patient here for follow-up on right lateral foot ulcer History of Present Illness (HPI) 04/06/16; this is a 83 year old woman who arrives accompanied by 2 daughters for a wound on her left ankle and her left fifth toe. These have apparently been present for a year. I'm not quite certain how she came to this clinic however she was being followed by Sharlotte Alamo her podiatrist for these wounds. She was also referred to [facility] and they apparently did a test presumably arterial studies although we don't have any of these results and we couldn't get through to the office today. The family but has been applying a combination of Bactroban and a light bandage and perhaps more recently Silvadene cream. She did have an  x-ray of the foot roughly 6 months ago at the podiatry office the family was unaware that if there were any abnormalities. Apparently they have not seen any healing here. Our intake nurse noted a slight skin tear on the right anterior lower leg. Nobody seemed aware of this. ABIs calculated in this clinic was 0.3 on the right and 0.4 on the left I have reviewed things [facility]. There is very little information on this patient. She apparently follows in current total clinic which we don't have information from. She has mentioned already been to a [facility]. She has a history of hypothyroidism,  nephrolithiasis arthritis and has had a previous mastectomy. 04/13/16; patient's x-ray was normal. She is already been to see Dr. Delana Meyer vascular surgery. Her arterial exam was from November 2016 this showed a left ABI of 0.62 her right of 0.76. Her duplex ultrasound of the left leg showed biphasic waves in the common femoral and distal femoral artery however monophasic waves in the superficial femoral artery proximal and mid biphasic it distal. 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 The patient tells me that she has pain at night when she tries to lie down which is improved by getting up and sitting in the chair this sounds like claudication at rest. Her wounds are on the left medial malleolus and the dorsal fifth toe small punched out wounds that are right on bone. We use Santyl last week 04/20/16: nurse informed me pt has declined evaluation for significant PAD. she denies systemic s/s of infections. 04/27/16;; the patient has had noninvasive arterial studies done in November 2016. ABI and the left was 0.62. Monophasic waves at the superficial femoral artery. Occluded to the posterior tibial artery. So had greater than 50% stenosis of the right superficial femoral and greater than 50% stenosis of the left superficial femoral artery. She had bilateral tibial peroneal artery disease. Both of the wounds on the left fifth toe and left lateral malleolus have been present for more than a year. They have been to see vein and vascular. The patient has some pain but miraculously I think the wounds have largely been stable. No [name], [name] ([id number]) evidence of infection 05/04/16; she goes for a noninvasive study tomorrow and then sees Dr. Fletcher Anon on Monday. By the time she is here next week we should have a better picture of whether something can be done with regards to her arterial flow. We continue to have ischemic-looking wounds on the left fifth toe and left lateral malleolus. 05/10/16; the  patient went for her arterial studies and saw Dr. Fletcher Anon. As predicted she is felt to have critical limb ischemia. The feeling is that she has occlusion of the SFA. The feeling would she would be a candidate for a stent to the SFA. The patient did not make a decision to proceed with the procedure and she is here with family members to discuss this me today. He shouldn't has a lot of pain and cannot sleep and rest well at night per her family. 05/24/16; the patient went and had a complex revascularization/angioplasty of the left superficial femoral artery followed by drug-coated balloon angioplasty and spots stenting. She tolerated the procedure well. She was recommended for dual antiplatelet drugs with Plavix and aspirin for at least a month. She went yesterday for I believe follow-up serial Dopplers and ABIs although I don't see these results. The patient is unfortunately complaining of a lot of pain in her bilateral lower legs below the knees from the  ankle to the knees. She apparently was prescribed lidocaine and apparently put this on her legs instead of over the wound areas. This may have something to do with it however there is a lot of edema in her bilateral legs I was able to find her arterial studies from yesterday. The left ABI has improved up to 0.66 post left SFA stent. The bilateral great toe indices remain abnormal with the left being in the 0.25 range. Duplex ultrasound showed her left SFA stent is patent monophasic waveforms persist in the left leg 06/07/16; continued punched-out areas over the dorsal left fifth toe and left lateral malleolus. No major improvement 06/28/16; the areas over her dorsal left fifth toe and left lateral malleolus are covered in surface slough we are using Iodoflex 07/05/16. We have been using Iodoflex for 2-3 weeks now. I have not been debridement is because of pain 07/19/16 currently we have been using Iodoflex for roughly the past month. Previously we were  unable to debride due to pain although today patient states that the pain is not nearly as severe as it has been in the past. In fact she rates this to be a 1 out of 10 and it worse to a to 10 with palpation of the wound. All and all her and her daughter feel like this is actually doing steadily better at this point in time. She is pleased with progress and we have been seeing her every 2 weeks. 08/02/16; this is a delightful 83 year old woman I have not seen in over a month. She has arterial insufficiency wounds remaining over the left lateral malleolus and the left fifth toe. With the help of Dr. Fletcher Anon we are able to get her revascularized on the left. The 2 wounds on the left have been making good progress and are definitely smaller especially the area over the left lateral malleolus. She is certainly in a lot less pain than she used to be although I still think there is some claudication type pain. Unfortunately she is developed a area on the right lateral foot which is a small open area but I think is threatened. I suspect a small ischemic areas well. Also on her right fifth toe there is an area that is not open but looks as though it is receiving too much pressure from footwear. Finally an area over her right malleolus although I don't think this is on its way to anything ominous 08/17/16 patient presents today for follow up evaluation she tells me that she is really doing fairly well from a pain standpoint compared to where she has been previous. She is tolerating the dressing changes without any complication. She continues to have discharge and drainage however. 08-30-16 Ms. Poteet presents today with her daughter, she states that she continues to have intermittent shooting pains to the left lateral fifth toe at this site of seems to be a healed ulcer. She denies any other issues that her wound related since her last appointment. Her daughter states that she is in need of a tramadol  refill at this time as she uses half a tablet at at bedtime to aid in sleep due to foot pain. Iodoflex has been used on all wounds in previous dressing changes. 09/13/16; the patient has ischemic wounds in her feet. The area over the left lateral malleolus is healed and [name], [name] ([id number]) the area over the left fifth toe looks improved.. She has an area on the lateral aspect of her right foot which is  a small but deep wound. Finally she has a new open area on the medial aspect of the left medial malleolus. This is superficial 09/27/16; the area over the left lateral malleolus remains healed. The area over the left fifth toe has a surface and she states the pain is better but I don't think this is completely closed. She has an area on the lateral aspect of her right foot is a small but deep wound. The new open area on the medial aspect of the left ankle was closed from last time. 10/18/16; the area over her left lateral malleolus remains healed. The area over the left fifth toe has a surface over the top of this however there is no overt open area. Given the underlying issues of severe PAD and continued pain in the toe I would think it would be unlikely this is truly healed however I am not planning to debridement this area. The area on the right lateral foot which was a more recent wound is a small punched out painful area again has significant surface slough and nonviable tissue. It is clear the patient still has claudication type pain however she remains functional. I have been giving her tramadol when necessary and that seems to help a lot with her pain the patient follows up with Dr. Fletcher Anon on January 18/18 11/01/16; patient missed her follow-up with Dr. Fletcher Anon last week due to a snow day. Follow-up is now on February 15. The areas on the right lateral malleolus and dorsal right fifth toe remain closed over. The fifth toe was tentative is there is a surface eschar however I'm not going  to disturb this. Therefore, her only open areas on the right lateral foot. This is a small punched out area. We have been using Prisma. I'm quite convinced this is an ischemic wound 11/15/16; patient has follow-up with Dr. Fletcher Anon on February 15. She has no open wound on the left leg and doesn't really complain of claudication that I can determine from talking to her. However on the right leg she clearly has some degree of claudication. The remaining open wound is on the right lateral foot. We have been using Iodosorb ointment 11/29/16; patient saw Dr. Fletcher Anon on 11/24/16. His comment is that her wound on her right lateral foot seems to be improving with local wound care. She has known significant right SFA disease. Her lower extremity Doppler will be repeated and according to the patient's daughter that appointment is on March 15. He is left with the thought that endovascular intervention in the right SFA might be necessary. The patient has quite a bit of pain especially at night related to the wound in her right foot. We changed her to Iodosorb ointment last week 12/13/16; this is a patient I follow every 2 weeks largely on a palliative approach at this point about ischemic wounds currently in the right foot. Initially she had them on her left lateral malleolus and left fifth toe. The area on the left lateral malleolus healed and the fifth toe has a surface eschar that I have elected not to remove. Both of these improved after revascularization by Dr. Fletcher Anon. We have been using Iodosorb to the right lateral foot not much change here. 12/27/16; the patient had her arterial studies. This showed known bilateral SFA disease. Stable right ABI 0.5 to stable left ABI at 0.7 to the did not do it TBI on the right it was 0.61 on the left she has a stent in the  long segment of her left SFA from 05/18/16. All of her wounds are somewhat better. She states her pain is better in the right foot is improved. 01/10/17-  patient is here for follow-up dilation of her right lateral foot ulcer. She has been tolerating Iodosorb. She is voices no complaints or concerns 01/24/17; small ischemic wound on right lateral foot. still non viable cover. follows with Dr. Fletcher Anon on 4/30. No open area on left foot 02/07/17 doing well and states she is pain free. Saw Dr. Fletcher Anon who said she is doing well and has "50% flow in the right leg and 70% in the left. According to her daughter he does not wish to do any further interventions [name], [name] ([id number]) Objective Constitutional Patient is hypertensive.. Pulse regular and within target range for patient.Marland Kitchen Respirations regular, non-labored and within target range.. Temperature is normal and within the target range for the patient.. Patient's appearance is neat and clean. Appears in no acute distress. Well nourished and well developed.. Vitals Time Taken: 9:52 AM, Height: 64 in, Weight: 158 lbs, BMI: 27.1, Temperature: 98.1 F, Pulse: 63 bpm, Respiratory Rate: 16 breaths/min, Blood Pressure: 148/52 mmHg. Eyes Conjunctivae clear. No discharge.Marland Kitchen Respiratory Respiratory effort is easy and symmetric bilaterally. Rate is normal at rest and on room air.. Cardiovascular faint dorsalis pedis pulse. Lymphatic none palpable in popliteal or inguinal area. Psychiatric No evidence of depression, anxiety, or agitation. Calm, cooperative, and communicative. Appropriate interactions and affect.. General Notes: wound exam; very small open area on the lateral left foot no drainage,no erythema. Integumentary (Hair, Skin) Wound #4 status is Open. Original cause of wound was Gradually Appeared. The wound is located on the Right,Lateral Foot. The wound measures 0.2cm length x 0.3cm width x 0.1cm depth; 0.047cm^2 area and 0.005cm^3 volume. The wound is limited to skin breakdown. There is no tunneling or undermining noted. There is a large amount of serous drainage noted. The wound margin is  distinct with the outline attached to the wound base. There is no granulation within the wound bed. There is a large (67-100%) amount of necrotic tissue within the wound bed including Adherent Slough. Periwound temperature was noted as No Abnormality. The periwound has tenderness on palpation. 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 - for clinic use Cleanse wound with mild soap and water May Shower, gently pat wound dry prior to applying new dressing. Anesthetic: Wound #4 Right,Lateral Foot: Topical Lidocaine 4% cream applied to wound bed prior to debridement - for clinic use Skin Barriers/Peri-Wound Care: Wound #4 Right,Lateral Foot: Skin Prep Primary Wound Dressing: Wound #4 Right,Lateral Foot: Iodosorb Ointment Secondary Dressing: Wound #4 Right,Lateral Foot: Dry Gauze Boardered Foam Dressing Dressing Change Frequency: Wound #4 Right,Lateral Foot: Change dressing every other day. Follow-up Appointments: Wound #4 Right,Lateral Foot: Return Appointment in 2 weeks. Edema Control: Wound #4 Right,Lateral Foot: Elevate legs to the level of the heart and pump ankles as often as possible Other: - compression stockings Additional Orders / Instructions: Wound #4 Right,Lateral Foot: Increase protein intake. Medications-please add to medication list.: Wound #4 Right,Lateral Foot: Other: - Vitamin C, Zinc,  Multivitamins Tramadol [name], [name] ([id number]) 1 small open area. no debridement. seems to be doing well with iodosorb.  no change 2 PAD; no plans for revascularization per Dr. Fletcher Anon. I will check his note 3 areas on the right foot were reviewed. No open area here at either one of her original wound sites 5th toe and lateral malleolus Electronic Signature(s) Signed: 02/08/2017 7:53:49 AM By: [name] MD Entered By: [name] on 02/07/2017 10:33:59 [name] ([id number]) -------------------------------------------------------------------------------- SuperBill Details Patient Name: [name] Date of Service: 02/07/2017 Medical Record Patient Account Number: [id number] Number: Treating RN: Carolyne Fiscal, Debi November 17, 1918 (83 y.o. 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 L97.512 Non-pressure chronic ulcer of other part of right foot with fat layer exposed Facility Procedures CPT4 Code: [id number] Description: 99213 - WOUND CARE VISIT-LEV 3 EST PT Modifier: Quantity: 1 Physician Procedures CPT4: Description Modifier Quantity Code [id number] - WC PHYS LEVEL 3 - EST PT 1 ICD-10 Description Diagnosis L97.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

## 2017-02-10 NOTE — Progress Notes (Signed)
YANIRIS, BRADDOCK ([id number]) Visit Report for 02/07/2017 Arrival Information Details Patient Name: [name], [name] Date of Service: 02/07/2017 9:45 AM Medical Record Patient Account Number: [id number] Number: Treating RN: Ahmed Prima 13-Jun-1919 (83 y.o. Other Clinician: Date of Birth/Sex: Female) Treating ROBSON, [place] Primary Care Avory Rahimi: [place] Kiyoshi Schaab/Extender: G Referring Quenton Recendez: Vinson Moselle in Treatment: 37 Visit Information History Since Last Visit All ordered tests and consults were completed: No Patient Arrived: Gilford Rile Added or deleted any medications: No Arrival Time: 09:48 Any new allergies or adverse reactions: No Accompanied By: daughter Had a fall or experienced change in No Transfer Assistance: EasyPivot activities of daily living that may affect Patient Lift risk of falls: Patient Identification Verified: Yes Signs or symptoms of abuse/neglect since last No Secondary Verification Process Yes visito Completed: Hospitalized since last visit: No Patient Requires Transmission- No Has Dressing in Place as Prescribed: Yes Based Precautions: Pain Present Now: No Patient Has Alerts: Yes Electronic Signature(s) Signed: 02/07/2017 4:41:02 PM By: Alric Quan Entered By: Alric Quan on 02/07/2017 09:52:43 [name] ([id number]) -------------------------------------------------------------------------------- Clinic Level of Care Assessment Details Patient Name: [name] Date of Service: 02/07/2017 9:45 AM Medical Record Patient Account Number: [id number] Number: Treating RN: Ahmed Prima [birth year] (83 y.o. Other Clinician: Date of Birth/Sex: Female) Treating ROBSON, [place] Primary Care Jai Bear: [place]lee [place] Aune Adami/Extender: G Referring Kesleigh Morson: Vinson Moselle in Treatment: 17 Clinic Level of Care Assessment Items TOOL 4 Quantity Score X - Use when only an EandM  is performed on FOLLOW-UP visit 1 0 ASSESSMENTS - Nursing Assessment / Reassessment X - Reassessment of Co-morbidities (includes updates in patient status) 1 10 X - Reassessment of Adherence to Treatment Plan 1 5 ASSESSMENTS - Wound and Skin Assessment / Reassessment X - Simple Wound Assessment / Reassessment - one wound 1 5 []  - Complex Wound Assessment / Reassessment - multiple wounds 0 []  - Dermatologic / Skin Assessment (not related to wound area) 0 ASSESSMENTS - Focused Assessment []  - Circumferential Edema Measurements - multi extremities 0 []  - Nutritional Assessment / Counseling / Intervention 0 []  - Lower Extremity Assessment (monofilament, tuning fork, pulses) 0 []  - Peripheral Arterial Disease Assessment (using hand held doppler) 0 ASSESSMENTS - Ostomy and/or Continence Assessment and Care []  - Incontinence Assessment and Management 0 []  - Ostomy Care Assessment and Management (repouching, etc.) 0 PROCESS - Coordination of Care X - Simple Patient / Family Education for ongoing care 1 15 []  - Complex (extensive) Patient / Family Education for ongoing care 0 []  - Staff obtains Programmer, systems, Records, Test Results / Process Orders 0 []  - Staff telephones HHA, Nursing Homes / Clarify orders / etc 0 Mcgrail, Lameeka ([id number]) []  - Routine Transfer to another Facility (non-emergent condition) 0 []  - Routine Hospital Admission (non-emergent condition) 0 []  - New Admissions / Biomedical engineer / Ordering NPWT, Apligraf, etc. 0 []  - Emergency Hospital Admission (emergent condition) 0 X - Simple Discharge Coordination 1 10 []  - Complex (extensive) Discharge Coordination 0 PROCESS - Special Needs []  - Pediatric / Minor Patient Management 0 []  - Isolation Patient Management 0 []  - Hearing / Language / Visual special needs 0 []  - Assessment of Community assistance (transportation, D/C planning, etc.) 0 []  - Additional assistance / Altered mentation 0 []  - Support Surface(s)  Assessment (bed, cushion, seat, etc.) 0 INTERVENTIONS - Wound Cleansing / Measurement X - Simple Wound Cleansing - one wound 1 5 []  - Complex Wound Cleansing - multiple wounds 0 X - Wound Imaging (photographs -  any number of wounds) 1 5 []  - Wound Tracing (instead of photographs) 0 X - Simple Wound Measurement - one wound 1 5 []  - Complex Wound Measurement - multiple wounds 0 INTERVENTIONS - Wound Dressings X - Small Wound Dressing one or multiple wounds 1 10 []  - Medium Wound Dressing one or multiple wounds 0 []  - Large Wound Dressing one or multiple wounds 0 X - Application of Medications - topical 1 5 []  - Application of Medications - injection 0 Gaw, Richell ([id number]) INTERVENTIONS - Miscellaneous []  - External ear exam 0 []  - Specimen Collection (cultures, biopsies, blood, body fluids, etc.) 0 []  - Specimen(s) / Culture(s) sent or taken to Lab for analysis 0 []  - Patient Transfer (multiple staff / Harrel Lemon Lift / Similar devices) 0 []  - Simple Staple / Suture removal (25 or less) 0 []  - Complex Staple / Suture removal (26 or more) 0 []  - Hypo / Hyperglycemic Management (close monitor of Blood Glucose) 0 []  - Ankle / Brachial Index (ABI) - do not check if billed separately 0 X - Vital Signs 1 5 Has the patient been seen at the hospital within the last three years: Yes Total Score: 80 Level Of Care: New/Established - Level 3 Electronic Signature(s) Signed: 02/07/2017 4:41:02 PM By: Alric Quan Entered By: Alric Quan on 02/07/2017 12:35:50 [name] ([id number]) -------------------------------------------------------------------------------- Complex / Palliative Patient Assessment Details Patient Name: [name] Date of Service: 02/07/2017 9:45 AM Medical Record Patient Account Number: [id number] Number: Treating RN: Cornell Barman 08-16-1919 (83 y.o. Other Clinician: Date of Birth/Sex: Female) Treating ROBSON, MICHAEL Primary Care Syair Fricker:  [place]lee [place] Yaritzy Huser/Extender: G Referring Muskaan Smet: Vinson Moselle in Treatment: 43 Palliative Management Criteria Complex Wound Management Criteria Patient requires a surgical procedure in order to achieve wound healing: stenting of right leg However, the surgeon has determined that the patient is not a surgical candidate due to medical status. Care Approach Wound Care Plan: Complex Wound Management Electronic Signature(s) Signed: 02/08/2017 9:10:18 AM By: Gretta Cool RN, BSN, Kim RN, BSN Signed: 02/08/2017 5:52:53 PM By: [name] MD Previous Signature: 02/08/2017 9:07:23 AM Version By: Gretta Cool RN, BSN, Kim RN, BSN Entered By: Gretta Cool, RN, BSN, Kim on 02/08/2017 09:10:18 [name] ([id number]) -------------------------------------------------------------------------------- Encounter Discharge Information Details Patient Name: [name] Date of Service: 02/07/2017 9:45 AM Medical Record Patient Account Number: [id number] Number: Treating RN: Ahmed Prima [birth year] (83 y.o. Other Clinician: Date of Birth/Sex: Female) Treating ROBSON, [place] Primary Care Adalia Pettis: [place]orelee Market Safwan Tomei/Extender: G Referring Shadiyah Wernli: Vinson Moselle in Treatment: 38 Encounter Discharge Information Items Discharge Pain Level: 0 Discharge Condition: Stable Ambulatory Status: Walker Discharge Destination: Home Transportation: Private Auto Accompanied By: daughter Schedule Follow-up Appointment: Yes Medication Reconciliation completed No and provided to Patient/Care Maleka Contino: Provided on Clinical Summary of Care: 02/07/2017 Form Type Recipient Paper Patient [facility] Electronic Signature(s) Signed: 02/07/2017 10:22:13 AM By: Ruthine Dose Entered By: Ruthine Dose on 02/07/2017 10:22:13 [name] ([id number]) -------------------------------------------------------------------------------- Lower Extremity Assessment Details Patient Name:  [name] Date of Service: 02/07/2017 9:45 AM Medical Record Patient Account Number: [id number] Number: Treating RN: Ahmed Prima May 10, 1919 (83 y.o. Other Clinician: Date of Birth/Sex: Female) Treating ROBSON, MICHAEL Primary Care Kenlynn Houde: [place]lee [place] Lilo Wallington/Extender: G Referring Yovanni Frenette: Vinson Moselle in Treatment: 43 Vascular Assessment Pulses: Dorsalis Pedis Palpable: [Right:Yes] Posterior Tibial Extremity colors, hair growth, and conditions: Extremity Color: [Right:Hyperpigmented] Temperature of Extremity: [Right:Warm] Capillary Refill: [Right:< 3 seconds] Toe Nail Assessment Left: Right: Thick: No Discolored: No Deformed: No Improper Length and  Hygiene: No Electronic Signature(s) Signed: 02/07/2017 4:41:02 PM By: Alric Quan Entered By: Alric Quan on 02/07/2017 10:00:39 [name] ([id number]) -------------------------------------------------------------------------------- Multi Wound Chart Details Patient Name: [name] Date of Service: 02/07/2017 9:45 AM Medical Record Patient Account Number: [id number] Number: Treating RN: Ahmed Prima August 22, 1919 (83 y.o. 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 N/A] Comorbid History: [4:Cataracts, Hypertension, N/A Osteoarthritis] Date Acquired: [4:07/26/2016] [N/A:N/A] Weeks of Treatment: [4:27] [N/A:N/A] Wound Status: [4:Open]  [N/A:N/A] Measurements L x W x D 0.2x0.3x0.1 [N/A:N/A] (cm) Area (cm) : [4:0.047] [N/A:N/A] Volume (cm) : [4:0.005] [N/A:N/A] % Reduction in Area: [4:0.00%] [N/A:N/A] % Reduction in Volume: 0.00% [N/A:N/A] Classification: [4:Partial Thickness] [N/A:N/A] Exudate Amount: [4:Large] [N/A:N/A] Exudate Type: [4:Serous] [N/A:N/A] Exudate Color: [4:amber] [N/A:N/A] Wound Margin: [4:Distinct, outline attached N/A] Granulation Amount: [4:None Present (0%)] [N/A:N/A] Necrotic Amount: [4:Large (67-100%)] [N/A:N/A] Exposed Structures: [4:Fascia: No Fat Layer (Subcutaneous Tissue) Exposed: No Tendon: No Muscle: No Joint: No] [N/A:N/A] Bone: No Limited to Skin Breakdown Epithelialization: None N/A N/A Periwound Skin Texture: No Abnormalities Noted N/A N/A Periwound Skin No Abnormalities Noted N/A N/A Moisture: Periwound Skin Color: No Abnormalities Noted N/A N/A Temperature: No Abnormality N/A N/A Tenderness on Yes N/A N/A Palpation: Wound Preparation: Ulcer Cleansing: N/A N/A Rinsed/Irrigated with Saline Topical Anesthetic Applied: Other: lidocaine 4% Treatment Notes Wound #4 (Right, Lateral Foot) 1. Cleansed with: Clean wound with Normal Saline 2. Anesthetic Topical Lidocaine 4% cream to wound bed prior to debridement 3. Peri-wound Care: Skin Prep 4. Dressing Applied: Iodosorb Ointment 5. Secondary Dressing Applied Bordered Foam Dressing Dry Gauze Notes iodosorb Electronic Signature(s) Signed: 02/08/2017 7:53:49 AM By: [name] MD Entered By: [name] on 02/07/2017 10:22:55 [name] ([id number]) -------------------------------------------------------------------------------- Multi-Disciplinary Care Plan Details Patient Name: [name] Date of Service: 02/07/2017 9:45 AM Medical Record Patient Account Number: [id number] Number: Treating RN: Ahmed Prima Feb 10, 1919 (83 y.o. Other Clinician: Date of Birth/Sex: Female) Treating  ROBSON, [place] Primary Care Grier Czerwinski: [place]lee [place] Terril Amaro/Extender: G Referring Azeez Dunker: Vinson Moselle in Treatment: 70 Active Inactive ` Abuse / Safety / Falls / Self Care Management Nursing Diagnoses: Potential for falls Goals: Patient will remain injury free Date Initiated: 04/06/2016 Target Resolution Date: 12/22/2016 Goal Status: Active Interventions: Assess fall risk on admission and as needed Notes: ` Nutrition Nursing Diagnoses: Imbalanced nutrition Goals: Patient/caregiver agrees to and verbalizes understanding of need to use nutritional supplements and/or vitamins as prescribed Date Initiated: 04/06/2016 Target Resolution Date: 12/22/2016 Goal Status: Active Interventions: Assess patient nutrition upon admission and as needed per policy Notes: ` Orientation to the Wound Care Program [place] ([id number]) Nursing Diagnoses: Knowledge deficit related to the wound healing center program Goals: Patient/caregiver will verbalize understanding of the [place] Date Initiated: 04/06/2016 Target Resolution Date: 12/22/2016 Goal Status: Active Interventions: Provide education on orientation to the wound center Notes: ` Pain, Acute or Chronic Nursing Diagnoses: Pain, acute or chronic: actual or potential Potential alteration in comfort, pain Goals: Patient will verbalize adequate pain control and receive pain control interventions during procedures as needed Date Initiated: 04/06/2016 Target Resolution Date: 12/22/2016 Goal Status: Active Interventions: Assess comfort goal upon admission Complete pain assessment as per visit  requirements Notes: ` Wound/Skin Impairment Nursing Diagnoses: Impaired tissue integrity Goals: Ulcer/skin breakdown will have a volume reduction of 30% by week 4 Date Initiated: 04/06/2016 Target Resolution Date: 12/22/2016 Goal Status: Active Ulcer/skin breakdown will have a volume reduction  of 50% by week 8 Date Initiated: 04/06/2016 Target Resolution Date: 12/22/2016 Goal Status: Active Ulcer/skin breakdown will have a volume reduction of 80% by week 12 CLAUDETT, BAYLY ([id number]) Date Initiated: 04/06/2016 Target Resolution Date: 12/22/2016 Goal Status: Active Interventions: Assess patient/caregiver ability to obtain necessary supplies Assess ulceration(s) every visit Notes: Electronic Signature(s) Signed: 02/07/2017 4:41:02 PM By: Alric Quan Entered By: Alric Quan on 02/07/2017 10:00:49 [name] ([id number]) -------------------------------------------------------------------------------- Pain Assessment Details Patient Name: [name] Date of Service: 02/07/2017 9:45 AM Medical Record Patient Account Number: [id number] Number: Treating RN: Ahmed Prima August 20, 1919 (83 y.o. Other Clinician: Date of Birth/Sex: Female) Treating ROBSON, MICHAEL Primary Care Teasia Zapf: [place]lee [place] Modesty Rudy/Extender: G Referring Kveon Casanas: Vinson Moselle in Treatment: 66 Active Problems Location of Pain Severity and Description of Pain Patient Has Paino No Site Locations With Dressing Change: No Pain Management and Medication Current Pain Management: Electronic Signature(s) Signed: 02/07/2017 4:41:02 PM By: Alric Quan Entered By: Alric Quan on 02/07/2017 09:52:50 [name] ([id number]) -------------------------------------------------------------------------------- Patient/Caregiver Education Details Patient Name: [name] Date of Service: 02/07/2017 9:45 AM Medical Record Patient Account Number: [id number] Number: Treating RN: Carolyne Fiscal, Debi Sep 29, 1919 (83 y.o. Other Clinician: Date of Birth/Gender: Female) Treating ROBSON, [place] Primary Care Physician: Lorelee Market Physician/Extender: G Referring Physician: Vinson Moselle in Treatment: 21 Education Assessment Education  Provided To: Patient Education Topics Provided Wound/Skin Impairment: Handouts: Other: change dressing as ordered Methods: Demonstration, Explain/Verbal Responses: State content correctly Electronic Signature(s) Signed: 02/07/2017 4:41:02 PM By: Alric Quan Entered By: Alric Quan on 02/07/2017 10:05:37 [name] ([id number]) -------------------------------------------------------------------------------- Wound Assessment Details Patient Name: [name] Date of Service: 02/07/2017 9:45 AM Medical Record Patient Account Number: [id number] Number: Treating RN: Ahmed Prima May 17, 1919 (83 y.o. Other Clinician: Date of Birth/Sex: Female) Treating ROBSON, [place] Primary Care Megan Presti: [place]lee [place] Hilda Rynders/Extender: G Referring Taten Merrow: [place]lee [place] Weeks in Treatment: 43 Wound Status Wound Number: 4 Primary Arterial Insufficiency Ulcer Etiology: Wound Location: Right Foot - Lateral Wound Status: Open Wounding Event: Gradually Appeared Comorbid Cataracts, Hypertension, Date Acquired: 07/26/2016 History: Osteoarthritis Weeks Of Treatment: 27 Clustered Wound: No Photos Photo Uploaded By: Alric Quan on 02/07/2017 16:36:51 Wound Measurements Length: (cm) 0.2 Width: (cm) 0.3 Depth: (cm) 0.1 Area: (cm) 0.047 Volume: (cm) 0.005 % Reduction in Area: 0% % Reduction in Volume: 0% Epithelialization: None Tunneling: No Undermining: No Wound Description Classification: Partial Thickness Foul Odor Afte Wound Margin: Distinct, outline attached Slough/Fibrino Exudate Amount: Large Exudate Type: Serous Exudate Color: amber r Cleansing: No Yes Wound Bed Granulation Amount: None Present (0%) Exposed Structure Necrotic Amount: Large (67-100%) Fascia Exposed: No Necrotic Quality: Adherent Slough Fat Layer (Subcutaneous Tissue) Exposed: No Garciagarcia, Kamilya ([id number]) Tendon Exposed: No Muscle Exposed: No Joint Exposed:  No Bone Exposed: No Limited to Skin Breakdown Periwound Skin Texture Texture Color No Abnormalities Noted: No No Abnormalities Noted: No Moisture Temperature / Pain No Abnormalities Noted: No Temperature: No Abnormality Tenderness on Palpation: Yes Wound Preparation Ulcer Cleansing: Rinsed/Irrigated with Saline Topical Anesthetic Applied: Other: lidocaine 4%, Treatment Notes Wound #4 (Right, Lateral Foot) 1. Cleansed with: Clean wound with Normal Saline 2. Anesthetic Topical Lidocaine 4% cream to wound bed prior to debridement 3. Peri-wound Care: Skin Prep 4. Dressing Applied: Iodosorb Ointment 5. Secondary Dressing Applied Bordered Foam Dressing  Dry Gauze Notes iodosorb Electronic Signature(s) Signed: 02/07/2017 4:41:02 PM By: Alric Quan Entered By: Alric Quan on 02/07/2017 09:59:26 [name] ([id number]) -------------------------------------------------------------------------------- [place] Details Patient Name: [name] Date of Service: 02/07/2017 9:45 AM Medical Record Patient Account Number: [id number] Number: Treating RN: Ahmed Prima [birth year] (83 y.o. Other Clinician: Date of Birth/Sex: Female) Treating ROBSON, MICHAEL Primary Care Shaka Cardin: [place] Alen Matheson/Extender: G Referring Anjelika Ausburn: [place]lee [place] Weeks in Treatment: 67 Vital Signs Time Taken: 09:52 Temperature (F): 98.1 Height (in): 64 Pulse (bpm): 63 Weight (lbs): 158 Respiratory Rate (breaths/min): 16 Body Mass Index (BMI): 27.1 Blood Pressure (mmHg): 148/52 Reference Range: 80 - 120 mg / dl Electronic Signature(s) Signed: 02/07/2017 4:41:02 PM By: Alric Quan Entered By: Alric Quan on 02/07/2017 09:55:05

## 2017-02-17 ENCOUNTER — Encounter (INDEPENDENT_AMBULATORY_CARE_PROVIDER_SITE_OTHER): Payer: Medicare PPO | Admitting: Ophthalmology

## 2017-02-17 DIAGNOSIS — H353231 Exudative age-related macular degeneration, bilateral, with active choroidal neovascularization: Secondary | ICD-10-CM

## 2017-02-17 DIAGNOSIS — H35033 Hypertensive retinopathy, bilateral: Secondary | ICD-10-CM | POA: Diagnosis not present

## 2017-02-17 DIAGNOSIS — I10 Essential (primary) hypertension: Secondary | ICD-10-CM | POA: Diagnosis not present

## 2017-02-17 DIAGNOSIS — H43813 Vitreous degeneration, bilateral: Secondary | ICD-10-CM

## 2017-02-21 ENCOUNTER — Encounter: Payer: Medicare PPO | Admitting: Internal Medicine

## 2017-02-21 DIAGNOSIS — I70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: Secondary | ICD-10-CM | POA: Diagnosis not present

## 2017-02-22 NOTE — Progress Notes (Signed)
[name], [name] ([id number]) Visit Report for 02/21/2017 Arrival Information Details Patient Name: [name], [name] Date of Service: 02/21/2017 9:45 AM Medical Record Patient Account Number: [id number] Number: Treating RN: [name] [birth year] (83 y.o. Other Clinician: Date of Birth/Sex: Female) Treating [name] Primary Care [name]: [name] [name]/Extender: G Referring [name]: [name] in Treatment: 67 Visit Information History Since Last Visit All ordered tests and consults were completed: No Patient Arrived: [name] Added or deleted any medications: No Arrival Time: 09:50 Any new allergies or adverse reactions: No Accompanied By: daughter Had a fall or experienced change in No Transfer Assistance: None activities of daily living that may affect Patient Identification Verified: Yes risk of falls: Secondary Verification Process Yes Signs or symptoms of abuse/neglect since last No Completed: visito Patient Requires Transmission-Based No Hospitalized since last visit: No Precautions: Has Dressing in Place as Prescribed: Yes Patient Has Alerts: Yes Pain Present Now: No Electronic Signature(s) Signed: 02/21/2017 4:34:49 PM By: [name] Entered By: [name] on 02/21/2017 09:51:45 [name] ([id number]) -------------------------------------------------------------------------------- Clinic Level of Care Assessment Details Patient Name: [name] Date of Service: 02/21/2017 9:45 AM Medical Record Patient Account Number: [id number] Number: Treating RN: [name] Nov 17, 1918 (83 y.o. Other Clinician: Date of Birth/Sex: Female) Treating [name] Primary Care [name]: [name] [name]/Extender: G Referring [name]: [name] in Treatment: 45 Clinic Level of Care Assessment Items TOOL 4 Quantity Score X - Use when only an EandM is performed on  FOLLOW-UP visit 1 0 ASSESSMENTS - Nursing Assessment / Reassessment X - Reassessment of Co-morbidities (includes updates in patient status) 1 10 X - Reassessment of Adherence to Treatment Plan 1 5 ASSESSMENTS - Wound and Skin Assessment / Reassessment X - Simple Wound Assessment / Reassessment - one wound 1 5 []  - Complex Wound Assessment / Reassessment - multiple wounds 0 []  - Dermatologic / Skin Assessment (not related to wound area) 0 ASSESSMENTS - Focused Assessment []  - Circumferential Edema Measurements - multi extremities 0 []  - Nutritional Assessment / Counseling / Intervention 0 []  - Lower Extremity Assessment (monofilament, tuning fork, pulses) 0 []  - Peripheral Arterial Disease Assessment (using hand held doppler) 0 ASSESSMENTS - Ostomy and/or Continence Assessment and Care []  - Incontinence Assessment and Management 0 []  - Ostomy Care Assessment and Management (repouching, etc.) 0 PROCESS - Coordination of Care X - Simple Patient / Family Education for ongoing care 1 15 []  - Complex (extensive) Patient / Family Education for ongoing care 0 []  - Staff obtains Programmer, systems, Records, Test Results / Process Orders 0 []  - Staff telephones HHA, Nursing Homes / Clarify orders / etc 0 [name], [name] ([id number]) []  - Routine Transfer to another Facility (non-emergent condition) 0 []  - Routine Hospital Admission (non-emergent condition) 0 []  - New Admissions / Biomedical engineer / Ordering NPWT, Apligraf, etc. 0 []  - Emergency Hospital Admission (emergent condition) 0 X - Simple Discharge Coordination 1 10 []  - Complex (extensive) Discharge Coordination 0 PROCESS - Special Needs []  - Pediatric / Minor Patient Management 0 []  - Isolation Patient Management 0 []  - Hearing / Language / Visual special needs 0 []  - Assessment of Community assistance (transportation, D/C planning, etc.) 0 []  - Additional assistance / Altered mentation 0 []  - Support Surface(s) Assessment (bed,  cushion, seat, etc.) 0 INTERVENTIONS - Wound Cleansing / Measurement X - Simple Wound Cleansing - one wound 1 5 []  - Complex Wound Cleansing - multiple wounds 0 []  - Wound Imaging (photographs - any number of  wounds) 0 []  - Wound Tracing (instead of photographs) 0 X - Simple Wound Measurement - one wound 1 5 []  - Complex Wound Measurement - multiple wounds 0 INTERVENTIONS - Wound Dressings X - Small Wound Dressing one or multiple wounds 1 10 []  - Medium Wound Dressing one or multiple wounds 0 []  - Large Wound Dressing one or multiple wounds 0 X - Application of Medications - topical 1 5 []  - Application of Medications - injection 0 Overall, [name] ([id number]) INTERVENTIONS - Miscellaneous []  - External ear exam 0 []  - Specimen Collection (cultures, biopsies, blood, body fluids, etc.) 0 []  - Specimen(s) / Culture(s) sent or taken to Lab for analysis 0 []  - Patient Transfer (multiple staff / Harrel Lemon Lift / Similar devices) 0 []  - Simple Staple / Suture removal (25 or less) 0 []  - Complex Staple / Suture removal (26 or more) 0 []  - Hypo / Hyperglycemic Management (close monitor of Blood Glucose) 0 []  - Ankle / Brachial Index (ABI) - do not check if billed separately 0 X - Vital Signs 1 5 Has the patient been seen at the hospital within the last three years: Yes Total Score: 75 Level Of Care: New/Established - Level 2 Electronic Signature(s) Signed: 02/21/2017 4:34:49 PM By: [name] Entered By: [name] on 02/21/2017 10:18:59 [name] ([id number]) -------------------------------------------------------------------------------- Encounter Discharge Information Details Patient Name: [name] Date of Service: 02/21/2017 9:45 AM Medical Record Patient Account Number: [id number] Number: Treating RN: [name], [name] 06-01-1919 (83 y.o. Other Clinician: Date of Birth/Sex: Female) Treating ROBSON, [name] Primary Care [name]: [name] [name]/Extender: G Referring [name]: [name] in Treatment: 45 Encounter Discharge Information Items Discharge Pain Level: 0 Discharge Condition: Stable Ambulatory Status: Walker Discharge Destination: Home Transportation: Private Auto Accompanied By: daughter Schedule Follow-up Appointment: Yes Medication Reconciliation completed No and provided to Patient/Care Aeron Donaghey: Provided on Clinical Summary of Care: 02/21/2017 Form Type Recipient Paper Patient [facility] Electronic Signature(s) Signed: 02/21/2017 10:16:44 AM By: Ruthine Dose Entered By: Ruthine Dose on 02/21/2017 10:16:43 [name] ([id number]) -------------------------------------------------------------------------------- Lower Extremity Assessment Details Patient Name: [name] Date of Service: 02/21/2017 9:45 AM Medical Record Patient Account Number: [id number] Number: Treating RN: [name] 20-Feb-1919 (83 y.o. Other Clinician: Date of Birth/Sex: Female) Treating [name] Primary Care Otis Burress: [name] Macarius Ruark/Extender: G Referring Emmamae Mcnamara: [name] Weeks in Treatment: 45 Vascular Assessment Pulses: Dorsalis Pedis Palpable: [Right:Yes] Posterior Tibial Extremity colors, hair growth, and conditions: Extremity Color: [Right:Mottled] Temperature of Extremity: [Right:Warm] Capillary Refill: [Right:< 3 seconds] Electronic Signature(s) Signed: 02/21/2017 4:34:49 PM By: [name] Entered By: [name] on 02/21/2017 09:59:34 [name] ([id number]) -------------------------------------------------------------------------------- Multi Wound Chart Details Patient Name: [name] Date of Service: 02/21/2017 9:45 AM Medical Record Patient Account Number: [id number] Number: Treating RN: [name], [name] 1918/11/09 (83 y.o. 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 N/A] Comorbid History: [4:Cataracts, Hypertension, N/A Osteoarthritis] Date Acquired: [4:07/26/2016] [N/A:N/A] Weeks of Treatment: [4:29] [N/A:N/A] Wound Status: [4:Open] [N/A:N/A] Measurements L x W x D 0.2x0.2x0.1 [N/A:N/A] (cm) Area (cm) : [4:0.031] [N/A:N/A] Volume (cm) : [4:0.003] [N/A:N/A] % Reduction in Area: [4:34.00%] [N/A:N/A] % Reduction in Volume: 40.00% [N/A:N/A]  Classification: [4:Partial Thickness] [N/A:N/A] Exudate Amount: [4:Medium] [N/A:N/A] Exudate Type: [4:Serous] [N/A:N/A] Exudate Color: [4:amber] [N/A:N/A] Wound Margin: [4:Distinct, outline attached N/A] Granulation Amount: [4:Large (67-100%)] [N/A:N/A] Granulation Quality: [4:Pink] [N/A:N/A] Necrotic Amount: [4:Small (1-33%)] [N/A:N/A] Exposed Structures: [4:Fascia: No Fat Layer (Subcutaneous Tissue) Exposed: No Tendon: No Muscle: No] [N/A:N/A] Joint: No Bone: No Limited to Skin Breakdown Epithelialization: Large (67-100%) N/A N/A Periwound Skin Texture: No Abnormalities Noted N/A N/A Periwound Skin No Abnormalities Noted N/A N/A Moisture: Periwound Skin Color: No Abnormalities Noted N/A N/A Temperature: No Abnormality N/A N/A Tenderness on Yes N/A N/A Palpation: Wound Preparation: Ulcer Cleansing: N/A N/A Rinsed/Irrigated with Saline Topical Anesthetic Applied: Other: lidocaine 4% Treatment Notes Wound #4 (Right, Lateral Foot) 1. Cleansed with: Clean wound with Normal Saline 2. Anesthetic Topical  Lidocaine 4% cream to wound bed prior to debridement 3. Peri-wound Care: Skin Prep 4. Dressing Applied: Iodosorb Ointment 5. Secondary Dressing Applied Bordered Foam Dressing Dry Gauze Notes iodosorb Electronic Signature(s) Signed: 02/21/2017 4:47:06 PM By: [name] MD Entered By: [name] on 02/21/2017 10:19:26 [name] ([id number]) -------------------------------------------------------------------------------- Multi-Disciplinary Care Plan Details Patient Name: [name] Date of Service: 02/21/2017 9:45 AM Medical Record Patient Account Number: [id number] Number: Treating RN: [name] Apr 17, 1919 (83 y.o. Other Clinician: Date of Birth/Sex: Female) Treating ROBSON, [place] Primary Care Rayburn Mundis: [name] Sharry Beining/Extender: G Referring Latara Micheli: [name] in Treatment: 7 Active Inactive ` Abuse / Safety / Falls / Self Care Management Nursing Diagnoses: Potential for falls Goals: Patient will remain injury free Date Initiated: 04/06/2016 Target Resolution Date: 12/22/2016 Goal Status: Active Interventions: Assess fall risk on admission and as needed Notes: ` Nutrition Nursing Diagnoses: Imbalanced nutrition Goals: Patient/caregiver agrees to and verbalizes understanding of need to use nutritional supplements and/or vitamins as prescribed Date Initiated: 04/06/2016 Target Resolution Date: 12/22/2016 Goal Status: Active Interventions: Assess patient nutrition upon admission and as needed per policy Notes: ` Orientation to the [place], [place] ([id number]) Nursing Diagnoses: Knowledge deficit related to the wound healing center program Goals: Patient/caregiver will verbalize understanding of the [place] Date Initiated: 04/06/2016 Target Resolution Date: 12/22/2016 Goal Status: Active Interventions: Provide education on orientation to the wound  center Notes: ` Pain, Acute or Chronic Nursing Diagnoses: Pain, acute or chronic: actual or potential Potential alteration in comfort, pain Goals: Patient will verbalize adequate pain control and receive pain control interventions during procedures as needed Date Initiated: 04/06/2016 Target Resolution Date: 12/22/2016 Goal Status: Active Interventions: Assess comfort goal upon admission Complete pain assessment as per visit requirements Notes: ` Wound/Skin Impairment Nursing Diagnoses: Impaired tissue integrity Goals: Ulcer/skin breakdown will have a volume reduction of 30% by week 4 Date Initiated: 04/06/2016 Target Resolution Date: 12/22/2016 Goal Status: Active Ulcer/skin breakdown will have a volume reduction of 50% by week 8 Date Initiated: 04/06/2016 Target Resolution Date: 12/22/2016 Goal Status: Active Ulcer/skin breakdown will have a volume reduction of 80% by week 12 [name], [name] ([id number]) Date Initiated: 04/06/2016 Target Resolution Date: 12/22/2016 Goal Status: Active Interventions: Assess patient/caregiver ability to obtain necessary supplies Assess ulceration(s) every visit Notes: Electronic Signature(s) Signed: 02/21/2017 4:34:49 PM By: [name] Entered By: [name] on 02/21/2017 10:01:16 [name] ([id number]) -------------------------------------------------------------------------------- Pain Assessment Details Patient Name: [name] Date of Service: 02/21/2017 9:45 AM Medical Record Patient Account Number: [id number] Number: Treating RN: [name] 01/04/1919 (83 y.o. Other Clinician: Date of Birth/Sex: Female) Treating [name] Primary Care Astor Gentle: [name] Fabio Wah/Extender: G Referring Leverne Tessler: [name] Weeks in Treatment: 31  Active Problems Location of Pain Severity and Description of Pain Patient Has Paino No Site Locations With Dressing Change: No Pain  Management and Medication Current Pain Management: Electronic Signature(s) Signed: 02/21/2017 4:34:49 PM By: [name] Entered By: [name] on 02/21/2017 09:51:51 [name] ([id number]) -------------------------------------------------------------------------------- Patient/Caregiver Education Details Patient Name: [name] Date of Service: 02/21/2017 9:45 AM Medical Record Patient Account Number: [id number] Number: Treating RN: [name], [name] [birth year] (83 y.o. Other Clinician: Date of Birth/Gender: Female) Treating ROBSON, [place] Primary Care Physician: [name] Physician/Extender: G Referring Physician: Vinson [name] in Treatment: 27 Education Assessment Education Provided To: Patient Education Topics Provided Wound/Skin Impairment: Handouts: Other: change dressing as ordered Methods: Demonstration, Explain/Verbal Responses: State content correctly Electronic Signature(s) Signed: 02/21/2017 4:34:49 PM By: [name] Entered By: [name] on 02/21/2017 10:02:46 [name] ([id number]) -------------------------------------------------------------------------------- Wound Assessment Details Patient Name: [name] Date of Service: 02/21/2017 9:45 AM Medical Record Patient Account Number: [id number] Number: Treating RN: [name] 08/09/1919 (83 y.o. Other Clinician: Date of Birth/Sex: Female) Treating ROBSON, [place] Primary Care Carreen Milius: [name] Ayham Word/Extender: G Referring Dakota Vanwart: [name] Weeks in Treatment: 18 Wound Status Wound Number: 4 Primary Arterial Insufficiency Ulcer Etiology: Wound Location: Right Foot - Lateral Wound Status: Open Wounding Event: Gradually Appeared Comorbid Cataracts, Hypertension, Date Acquired: 07/26/2016 History: Osteoarthritis Weeks Of Treatment: 29 Clustered Wound: No Photos Photo Uploaded By:  [name] on 02/21/2017 10:20:54 Wound Measurements Length: (cm) 0.2 Width: (cm) 0.2 Depth: (cm) 0.1 Area: (cm) 0.031 Volume: (cm) 0.003 % Reduction in Area: 34% % Reduction in Volume: 40% Epithelialization: Large (67-100%) Tunneling: No Undermining: No Wound Description Classification: Partial Thickness Foul Odor Aft Wound Margin: Distinct, outline attached Slough/Fibrin Exudate Amount: Medium Exudate Type: Serous Exudate Color: amber er Cleansing: No o Yes Wound Bed Granulation Amount: Large (67-100%) Exposed Structure Granulation Quality: Pink Fascia Exposed: No Necrotic Amount: Small (1-33%) Fat Layer (Subcutaneous Tissue) Exposed: No [name], [name] ([id number]) Necrotic Quality: Adherent Slough Tendon Exposed: No Muscle Exposed: No Joint Exposed: No Bone Exposed: No Limited to Skin Breakdown Periwound Skin Texture Texture Color No Abnormalities Noted: No No Abnormalities Noted: No Moisture Temperature / Pain No Abnormalities Noted: No Temperature: No Abnormality Tenderness on Palpation: Yes Wound Preparation Ulcer Cleansing: Rinsed/Irrigated with Saline Topical Anesthetic Applied: Other: lidocaine 4%, Treatment Notes Wound #4 (Right, Lateral Foot) 1. Cleansed with: Clean wound with Normal Saline 2. Anesthetic Topical Lidocaine 4% cream to wound bed prior to debridement 3. Peri-wound Care: Skin Prep 4. Dressing Applied: Iodosorb Ointment 5. Secondary Dressing Applied Bordered Foam Dressing Dry Gauze Notes iodosorb Electronic Signature(s) Signed: 02/21/2017 4:34:49 PM By: [name] Entered By: [name] on 02/21/2017 09:57:43 [name] ([id number]) -------------------------------------------------------------------------------- [place] Details Patient Name: [name] Date of Service: 02/21/2017 9:45 AM Medical Record Patient Account Number: [id number] Number: Treating RN: [name] [birth year]  (83 y.o. Other Clinician: Date of Birth/Sex: Female) Treating [name] Primary Care Cristian Davitt: [name] Margueritte Guthridge/Extender: G Referring Aminah Zabawa: [name] Weeks in Treatment: 45 Vital Signs Time Taken: 09:51 Temperature (F): 98.0 Height (in): 64 Pulse (bpm): 80 Weight (lbs): 158 Respiratory Rate (breaths/min): 16 Body Mass Index (BMI): 27.1 Blood Pressure (mmHg): 143/51 Reference Range: 80 - 120 mg / dl Electronic Signature(s) Signed: 02/21/2017 4:34:49 PM By: [name] Entered By: [name] on 02/21/2017 09:54:01

## 2017-02-22 NOTE — Progress Notes (Addendum)
POLINA, BURMASTER ([id number]) Visit Report for 02/21/2017 Chief Complaint Document Details Patient Name: [name], [name] Date of Service: 02/21/2017 9:45 AM Medical Record Patient Account Number: [id number] Number: Treating RN: Ahmed Prima [birth year] (83 y.o. Other Clinician: Date of Birth/Sex: Female) Treating Donisha Hoch Primary Care Provider: Lorelee Market Provider/Extender: G Referring Provider: Vinson Moselle in Treatment: 58 Information Obtained from: Patient Chief Complaint here for follow-up on right lateral foot ulcer Electronic Signature(s) Signed: 02/21/2017 4:47:06 PM By: [name] MD Entered By: [name] on 02/21/2017 10:19:44 [name] ([id number]) -------------------------------------------------------------------------------- HPI Details Patient Name: [name] Date of Service: 02/21/2017 9:45 AM Medical Record Patient Account Number: [id number] Number: Treating RN: Ahmed Prima 09-29-1919 (83 y.o. Other Clinician: Date of Birth/Sex: Female) Treating Amrit Cress Primary Care Provider: Lorelee Market Provider/Extender: G Referring Provider: Lorelee Market Weeks in Treatment: 68 History of Present Illness HPI Description: 04/06/16; this is a 83 year old woman who arrives accompanied by 2 daughters for a wound on her left ankle and her left fifth toe. These have apparently been present for a year. I'm not quite certain how she came to this clinic however she was being followed by Sharlotte Alamo her podiatrist for these wounds. She was also referred to [facility] and they apparently did a test presumably arterial studies although we don't have any of these results and we couldn't get through to the office today. The family but has been applying a combination of Bactroban and a light bandage and perhaps more recently Silvadene cream. She did have an x-ray of the foot roughly  6 months ago at the podiatry office the family was unaware that if there were any abnormalities. Apparently they have not seen any healing here. Our intake nurse noted a slight skin tear on the right anterior lower leg. Nobody seemed aware of this. ABIs calculated in this clinic was 0.3 on the right and 0.4 on the left I have reviewed things [facility]. There is very little information on this patient. She apparently follows in current total clinic which we don't have information from. She has mentioned already been to a [facility]. She has a history of hypothyroidism, nephrolithiasis arthritis and has had a previous mastectomy. 04/13/16; patient's x-ray was normal. She is already been to see Dr. Delana Meyer vascular surgery. Her arterial exam was from November 2016 this showed a left ABI of 0.62 her right of 0.76. Her duplex ultrasound of the left leg showed biphasic waves in the common femoral and distal femoral artery however monophasic waves in the superficial femoral artery proximal and mid biphasic it distal. 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 The patient tells me that she has pain at night when she tries to lie down which is improved by getting up and sitting in the chair this sounds like claudication at rest. Her wounds are on the left medial malleolus and the dorsal fifth toe small punched out wounds that are right on bone. We use Santyl last week 04/20/16: nurse informed me pt has declined evaluation for significant PAD. she denies systemic s/s of infections. 04/27/16;; the patient has had noninvasive arterial studies done in November 2016. ABI and the left was 0.62. Monophasic waves at the superficial femoral artery. Occluded to the posterior tibial artery. So had greater than 50% stenosis of the right superficial femoral and greater than 50% stenosis of the left superficial femoral artery. She had bilateral tibial peroneal artery disease. Both of the wounds on the left fifth toe and  left lateral malleolus have been present for more than a year. They have been to see vein and vascular. The patient has some pain but miraculously I think the wounds have largely been stable. No evidence of infection 05/04/16; she goes for a noninvasive study tomorrow and then sees Dr. Fletcher Anon on Monday. By the time she is here next week we should have a better picture of whether something can be done with regards to her arterial flow. We continue to have ischemic-looking wounds on the left fifth toe and left lateral malleolus. 05/10/16; the patient went for her arterial studies and saw Dr. Fletcher Anon. As predicted she is felt to have critical limb ischemia. The feeling is that she has occlusion of the SFA. The feeling would she would be a Jaspers, Christal ([id number]) candidate for a stent to the SFA. The patient did not make a decision to proceed with the procedure and she is here with family members to discuss this me today. He shouldn't has a lot of pain and cannot sleep and rest well at night per her family. 05/24/16; the patient went and had a complex revascularization/angioplasty of the left superficial femoral artery followed by drug-coated balloon angioplasty and spots stenting. She tolerated the procedure well. She was recommended for dual antiplatelet drugs with Plavix and aspirin for at least a month. She went yesterday for I believe follow-up serial Dopplers and ABIs although I don't see these results. The patient is unfortunately complaining of a lot of pain in her bilateral lower legs below the knees from the ankle to the knees. She apparently was prescribed lidocaine and apparently put this on her legs instead of over the wound areas. This may have something to do with it however there is a lot of edema in her bilateral legs I was able to find her arterial studies from yesterday. The left ABI has improved up to 0.66 post left SFA stent. The bilateral great toe indices remain abnormal with the  left being in the 0.25 range. Duplex ultrasound showed her left SFA stent is patent monophasic waveforms persist in the left leg 06/07/16; continued punched-out areas over the dorsal left fifth toe and left lateral malleolus. No major improvement 06/28/16; the areas over her dorsal left fifth toe and left lateral malleolus are covered in surface slough we are using Iodoflex 07/05/16. We have been using Iodoflex for 2-3 weeks now. I have not been debridement is because of pain 07/19/16 currently we have been using Iodoflex for roughly the past month. Previously we were unable to debride due to pain although today patient states that the pain is not nearly as severe as it has been in the past. In fact she rates this to be a 1 out of 10 and it worse to a to 10 with palpation of the wound. All and all her and her daughter feel like this is actually doing steadily better at this point in time. She is pleased with progress and we have been seeing her every 2 weeks. 08/02/16; this is a delightful 83 year old woman I have not seen in over a month. She has arterial insufficiency wounds remaining over the left lateral malleolus and the left fifth toe. With the help of Dr. Fletcher Anon we are able to get her revascularized on the left. The 2 wounds on the left have been making good progress and are definitely smaller especially the area over the left lateral malleolus. She is certainly in a lot less pain than she used to be  although I still think there is some claudication type pain. Unfortunately she is developed a area on the right lateral foot which is a small open area but I think is threatened. I suspect a small ischemic areas well. Also on her right fifth toe there is an area that is not open but looks as though it is receiving too much pressure from footwear. Finally an area over her right malleolus although I don't think this is on its way to anything ominous 08/17/16 patient presents today for follow up  evaluation she tells me that she is really doing fairly well from a pain standpoint compared to where she has been previous. She is tolerating the dressing changes without any complication. She continues to have discharge and drainage however. 08-30-16 Ms. Chandley presents today with her daughter, she states that she continues to have intermittent shooting pains to the left lateral fifth toe at this site of seems to be a healed ulcer. She denies any other issues that her wound related since her last appointment. Her daughter states that she is in need of a tramadol refill at this time as she uses half a tablet at at bedtime to aid in sleep due to foot pain. Iodoflex has been used on all wounds in previous dressing changes. 09/13/16; the patient has ischemic wounds in her feet. The area over the left lateral malleolus is healed and the area over the left fifth toe looks improved.. She has an area on the lateral aspect of her right foot which is a small but deep wound. Finally she has a new open area on the medial aspect of the left medial malleolus. This is superficial 09/27/16; the area over the left lateral malleolus remains healed. The area over the left fifth toe has a surface and she states the pain is better but I don't think this is completely closed. She has an area on the lateral aspect of her right foot is a small but deep wound. The new open area on the medial aspect of the [facility], Kieu ([id number]) left ankle was closed from last time. 10/18/16; the area over her left lateral malleolus remains healed. The area over the left fifth toe has a surface over the top of this however there is no overt open area. Given the underlying issues of severe PAD and continued pain in the toe I would think it would be unlikely this is truly healed however I am not planning to debridement this area. The area on the right lateral foot which was a more recent wound is a small punched out painful area  again has significant surface slough and nonviable tissue. It is clear the patient still has claudication type pain however she remains functional. I have been giving her tramadol when necessary and that seems to help a lot with her pain the patient follows up with Dr. Fletcher Anon on January 18/18 11/01/16; patient missed her follow-up with Dr. Fletcher Anon last week due to a snow day. Follow-up is now on February 15. The areas on the right lateral malleolus and dorsal right fifth toe remain closed over. The fifth toe was tentative is there is a surface eschar however I'm not going to disturb this. Therefore, her only open areas on the right lateral foot. This is a small punched out area. We have been using Prisma. I'm quite convinced this is an ischemic wound 11/15/16; patient has follow-up with Dr. Fletcher Anon on February 15. She has no open wound on the left leg and  doesn't really complain of claudication that I can determine from talking to her. However on the right leg she clearly has some degree of claudication. The remaining open wound is on the right lateral foot. We have been using Iodosorb ointment 11/29/16; patient saw Dr. Fletcher Anon on 11/24/16. His comment is that her wound on her right lateral foot seems to be improving with local wound care. She has known significant right SFA disease. Her lower extremity Doppler will be repeated and according to the patient's daughter that appointment is on March 15. He is left with the thought that endovascular intervention in the right SFA might be necessary. The patient has quite a bit of pain especially at night related to the wound in her right foot. We changed her to Iodosorb ointment last week 12/13/16; this is a patient I follow every 2 weeks largely on a palliative approach at this point about ischemic wounds currently in the right foot. Initially she had them on her left lateral malleolus and left fifth toe. The area on the left lateral malleolus healed and the fifth  toe has a surface eschar that I have elected not to remove. Both of these improved after revascularization by Dr. Fletcher Anon. We have been using Iodosorb to the right lateral foot not much change here. 12/27/16; the patient had her arterial studies. This showed known bilateral SFA disease. Stable right ABI 0.5 to stable left ABI at 0.7 to the did not do it TBI on the right it was 0.61 on the left she has a stent in the long segment of her left SFA from 05/18/16. All of her wounds are somewhat better. She states her pain is better in the right foot is improved. 01/10/17- patient is here for follow-up dilation of her right lateral foot ulcer. She has been tolerating Iodosorb. She is voices no complaints or concerns 01/24/17; small ischemic wound on right lateral foot. still non viable cover. follows with Dr. Fletcher Anon on 4/30. No open area on left foot 02/07/17 doing well and states she is pain free. Saw Dr. Fletcher Anon who said she is doing well and has "50% flow in the right leg and 70% in the left. According to her daughter he does not wish to do any further interventions 02/21/17; small ischemic wound on the right lateral foot. Per her daughter and the patient she has no open wound on the left foot and ankle which were her initial presenting wounds. still has claudication type pain at night she is been to see interventional cardiology who does not feel that she has a need for intervention on the right leg at present. She still has claudication type pain in the right leg which she manages at night with when necessary tramadol that I have prescribed Electronic Signature(s) Signed: 02/21/2017 4:47:06 PM By: [name] MD Entered By: [name] on 02/21/2017 10:21:54 [name] ([id number]) IEESHA, ABBASI ([id number]) -------------------------------------------------------------------------------- Physical Exam Details Patient Name: [name] Date of Service: 02/21/2017 9:45 AM Medical Record  Patient Account Number: [id number] Number: Treating RN: Ahmed Prima September 10, 1919 (83 y.o. Other Clinician: Date of Birth/Sex: Female) Treating Deloise Marchant Primary Care Provider: Lorelee Market Provider/Extender: G Referring Provider: Lorelee Market Weeks in Treatment: 11 Constitutional Sitting or standing Blood Pressure is within target range for patient.. Pulse regular and within target range for patient.Marland Kitchen Respirations regular, non-labored and within target range.. Temperature is normal and within the target range for the patient.. Patient is in no distress. Eyes Appear normal. Respiratory Respiratory effort is  easy and symmetric bilaterally. Rate is normal at rest and on room air.. Cardiovascular Femoral pulses palpable. Solace pedis pulses and posterior tibial pulses are absent luteal pulses absent. Musculoskeletal Does quite well with minor assistance of her daughter. Integumentary (Hair, Skin) No rashes seen. Psychiatric No evidence of depression, anxiety, or agitation. Calm, cooperative, and communicative. Appropriate interactions and affect.. Notes Wound exam; the right lateral foot there is a small opening however under the light the actual opening of this wound is even smaller than that. Three quarters of the circumference of the wound at the surface appears to be epithelialized with only a small wound in the middle of this that has 0.2 cm in depth. There is no evidence of surrounding infection no tenderness Electronic Signature(s) Signed: 02/21/2017 4:47:06 PM By: [name] MD Entered By: [name] on 02/21/2017 10:24:36 [name] ([id number]) -------------------------------------------------------------------------------- Physician Orders Details Patient Name: [name] Date of Service: 02/21/2017 9:45 AM Medical Record Patient Account Number: [id number] Number: Treating RN: Ahmed Prima Aug 03, 1919 (83  y.o. Other Clinician: Date of Birth/Sex: Female) Treating Eswin Worrell Primary Care Provider: Lorelee Market Provider/Extender: G Referring Provider: Vinson Moselle in Treatment: 73 Verbal / Phone Orders: Yes Clinician: Carolyne Fiscal, Debi Read Back and Verified: Yes Diagnosis Coding Wound Cleansing Wound #4 Right,Lateral Foot o Clean wound with Normal Saline. Anesthetic Wound #4 Right,Lateral Foot o Topical Lidocaine 4% cream applied to wound bed prior to debridement Skin Barriers/Peri-Wound Care Wound #4 Right,Lateral Foot o Skin Prep Primary Wound Dressing Wound #4 Right,Lateral Foot o Iodosorb Ointment Secondary Dressing Wound #4 Right,Lateral Foot o Dry Gauze o Boardered Foam Dressing Dressing Change Frequency Wound #4 Right,Lateral Foot o Change dressing every other day. Follow-up Appointments Wound #4 Right,Lateral Foot o Return Appointment in: - 3 weeks Edema Control Wound #4 Right,Lateral Foot o Elevate legs to the level of the heart and pump ankles as often as possible Raisanen, Rejoice ([id number]) Additional Orders / Instructions Wound #4 Right,Lateral Foot o Increase protein intake. Medications-please add to medication list. Wound #4 Right,Lateral Foot o Other: - Tramadol Patient Medications Allergies: codeine Notifications Medication Indication Start End tramadol 02/21/2017 DOSE q6h prn - oral 50 mg tablet - q6h prn tablet oral Electronic Signature(s) Signed: 02/21/2017 4:34:49 PM By: Alric Quan Signed: 02/21/2017 4:47:06 PM By: [name] MD Previous Signature: 02/21/2017 10:11:20 AM Version By: [name] MD Entered By: Alric Quan on 02/21/2017 10:14:55 [name] ([id number]) -------------------------------------------------------------------------------- Prescription 02/21/2017 Patient Name: [name] Provider: Ricard Dillon MD Date of Birth: September 03, 1919 NPI#: [id number] Sex:  F DEA#: [id number] Phone #: [phone] License #: [id number] Patient Address: [employer] [place], [place] [address] [facility] [address] [place], [place] [address] [phone] Allergies codeine Reaction: sick on stomach Severity: Severe Medication Note: This prescription was automatically generated because the medication is a controlled substance, and cannot be prescribed electronically. Medication: Route: Strength: Form: tramadol 50 mg tablet oral 50 mg tablet Class: ANALGESICS, NARCOTICS Dose: Frequency / Time: Indication: q6h prn q6h prn tablet oral Number of Refills: Number of Units: 1 Fourty (40) Tablet(s) Generic Substitution: Start Date: End Date: One Time Use: Substitution Permitted 7/82/4235 No Note to Pharmacy: Signature(s): Date(s): PENINA, REISNER ([id number]) Electronic Signature(s) Signed: 02/21/2017 4:34:49 PM By: Alric Quan Signed: 02/21/2017 4:47:06 PM By: [name] MD Entered By: Alric Quan on 02/21/2017 10:14:57 [name] ([id number]) --------------------------------------------------------------------------------  Problem List Details Patient Name: [name] Date of Service: 02/21/2017 9:45 AM Medical  Record Patient Account Number: [id number] Number: Treating RN: Ahmed Prima [birth year] (83 y.o. 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 04/06/2016 Yes of other part of foot I70.243 Atherosclerosis of native arteries of  left leg with ulceration 04/06/2016 Yes of ankle L97.512 Non-pressure chronic ulcer of other part of right foot with 08/30/2016 Yes fat layer exposed Inactive Problems Resolved Problems Electronic Signature(s) Signed: 02/21/2017 4:47:06 PM By: [name] MD Entered By: [name] on 02/21/2017 10:19:15 [name] ([id number]) -------------------------------------------------------------------------------- Progress Note Details Patient Name: [name] Date of Service: 02/21/2017 9:45 AM Medical Record Patient Account Number: [id number] Number: Treating RN: Carolyne Fiscal, Debi [birth year] (83 y.o. Other Clinician: Date of Birth/Sex: Female) Treating Manvir Prabhu Primary Care Provider: Lorelee Market Provider/Extender: G Referring Provider: Vinson Moselle in Treatment: 11 Subjective Chief Complaint Information obtained from Patient here for follow-up on right lateral foot ulcer History of Present Illness (HPI) 04/06/16; this is a 83 year old woman who arrives accompanied by 2 daughters for a wound on her left ankle and her left fifth toe. These have apparently been present for a year. I'm not quite certain how she came to this clinic however she was being followed by Sharlotte Alamo her podiatrist for these wounds. She was also referred to [facility] and they apparently did a test presumably arterial studies although we don't have any of these results and we couldn't get through to the office today. The family but has been applying a combination of Bactroban and a light bandage and perhaps more recently Silvadene cream. She did have an x-ray of the foot roughly 6 months ago at the podiatry office the family was unaware that if there were any abnormalities. Apparently they have not seen any healing here. Our intake nurse noted a slight skin tear on the right anterior lower leg. Nobody seemed aware of this. ABIs calculated in this  clinic was 0.3 on the right and 0.4 on the left I have reviewed things [facility]. There is very little information on this patient. She apparently follows in current total clinic which we don't have information from. She has mentioned already been to a [facility]. She has a history of hypothyroidism, nephrolithiasis arthritis and has had a previous mastectomy. 04/13/16; patient's x-ray was normal. She is already been to see Dr. Delana Meyer vascular surgery. Her arterial exam was from November 2016 this showed a left ABI of 0.62 her right of 0.76. Her duplex ultrasound of the left leg showed biphasic waves in the common femoral and distal femoral artery however monophasic waves in the superficial femoral artery proximal and mid biphasic it distal. 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 The patient tells me that she has pain at night when she tries to lie down which is improved by getting up and sitting in the chair this sounds like claudication at rest. Her wounds are on the left medial malleolus and the dorsal fifth toe small punched out wounds that are right on bone. We use Santyl last week 04/20/16: nurse informed me pt has declined evaluation for significant PAD. she denies systemic s/s of infections. 04/27/16;; the patient has had noninvasive arterial studies done in November 2016. ABI and the  left was 0.62. Monophasic waves at the superficial femoral artery. Occluded to the posterior tibial artery. So had greater than 50% stenosis of the right superficial femoral and greater than 50% stenosis of the left superficial femoral artery. She had bilateral tibial peroneal artery disease. Both of the wounds on the left fifth toe and left lateral malleolus have been present for more than a year. They have been to see vein and vascular. The patient has some pain but miraculously I think the wounds have largely been stable. No Messinger, Willadeen ([id number]) evidence of infection 05/04/16; she goes for a  noninvasive study tomorrow and then sees Dr. Fletcher Anon on Monday. By the time she is here next week we should have a better picture of whether something can be done with regards to her arterial flow. We continue to have ischemic-looking wounds on the left fifth toe and left lateral malleolus. 05/10/16; the patient went for her arterial studies and saw Dr. Fletcher Anon. As predicted she is felt to have critical limb ischemia. The feeling is that she has occlusion of the SFA. The feeling would she would be a candidate for a stent to the SFA. The patient did not make a decision to proceed with the procedure and she is here with family members to discuss this me today. He shouldn't has a lot of pain and cannot sleep and rest well at night per her family. 05/24/16; the patient went and had a complex revascularization/angioplasty of the left superficial femoral artery followed by drug-coated balloon angioplasty and spots stenting. She tolerated the procedure well. She was recommended for dual antiplatelet drugs with Plavix and aspirin for at least a month. She went yesterday for I believe follow-up serial Dopplers and ABIs although I don't see these results. The patient is unfortunately complaining of a lot of pain in her bilateral lower legs below the knees from the ankle to the knees. She apparently was prescribed lidocaine and apparently put this on her legs instead of over the wound areas. This may have something to do with it however there is a lot of edema in her bilateral legs I was able to find her arterial studies from yesterday. The left ABI has improved up to 0.66 post left SFA stent. The bilateral great toe indices remain abnormal with the left being in the 0.25 range. Duplex ultrasound showed her left SFA stent is patent monophasic waveforms persist in the left leg 06/07/16; continued punched-out areas over the dorsal left fifth toe and left lateral malleolus. No major improvement 06/28/16; the areas over  her dorsal left fifth toe and left lateral malleolus are covered in surface slough we are using Iodoflex 07/05/16. We have been using Iodoflex for 2-3 weeks now. I have not been debridement is because of pain 07/19/16 currently we have been using Iodoflex for roughly the past month. Previously we were unable to debride due to pain although today patient states that the pain is not nearly as severe as it has been in the past. In fact she rates this to be a 1 out of 10 and it worse to a to 10 with palpation of the wound. All and all her and her daughter feel like this is actually doing steadily better at this point in time. She is pleased with progress and we have been seeing her every 2 weeks. 08/02/16; this is a delightful 83 year old woman I have not seen in over a month. 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 With the help of Dr. Fletcher Anon we are able to get her revascularized on the left. The 2 wounds on the left have been making good progress and are definitely smaller especially the area over the left lateral malleolus. She is certainly in a lot less pain than she used to be although I still think there is some claudication type pain. Unfortunately she is developed a area on the right lateral foot which is a small open area but I think is threatened. I suspect a small ischemic areas well. Also on her right fifth toe there is an area that is not open but looks as though it is receiving too much pressure from footwear. Finally an area over her right malleolus although I don't think this is on its way to anything ominous 08/17/16 patient presents today for follow up evaluation she tells me that she is really doing fairly well from a pain standpoint compared to where she has been previous. She is tolerating the dressing changes without any complication. She continues to have discharge and drainage however. 08-30-16 Ms. Pandit presents  today with her daughter, she states that she continues to have intermittent shooting pains to the left lateral fifth toe at this site of seems to be a healed ulcer. She denies any other issues that her wound related since her last appointment. Her daughter states that she is in need of a tramadol refill at this time as she uses half a tablet at at bedtime to aid in sleep due to foot pain. Iodoflex has been used on all wounds in previous dressing changes. 09/13/16; the patient has ischemic wounds in her feet. The area over the left lateral malleolus is healed and Arduini, [name] ([id number]) the area over the left fifth toe looks improved.. She has an area on the lateral aspect of her right foot which is a small but deep wound. Finally she has a new open area on the medial aspect of the left medial malleolus. This is superficial 09/27/16; the area over the left lateral malleolus remains healed. The area over the left fifth toe has a surface and she states the pain is better but I don't think this is completely closed. She has an area on the lateral aspect of her right foot is a small but deep wound. The new open area on the medial aspect of the left ankle was closed from last time. 10/18/16; the area over her left lateral malleolus remains healed. The area over the left fifth toe has a surface over the top of this however there is no overt open area. Given the underlying issues of severe PAD and continued pain in the toe I would think it would be unlikely this is truly healed however I am not planning to debridement this area. The area on the right lateral foot which was a more recent wound is a small punched out painful area again has significant surface slough and nonviable tissue. It is clear the patient still has claudication type pain however she remains functional. I have been giving her tramadol when necessary and that seems to help a lot with her pain the patient follows up with Dr. Fletcher Anon on  January 18/18 11/01/16; patient missed her follow-up with Dr. Fletcher Anon last week due to a snow day. Follow-up is now on February 15. The areas on the right lateral malleolus and dorsal right fifth toe remain closed over. The fifth toe was tentative is there is a surface eschar however  I'm not going to disturb this. Therefore, her only open areas on the right lateral foot. This is a small punched out area. We have been using Prisma. I'm quite convinced this is an ischemic wound 11/15/16; patient has follow-up with Dr. Fletcher Anon on February 15. She has no open wound on the left leg and doesn't really complain of claudication that I can determine from talking to her. However on the right leg she clearly has some degree of claudication. The remaining open wound is on the right lateral foot. We have been using Iodosorb ointment 11/29/16; patient saw Dr. Fletcher Anon on 11/24/16. His comment is that her wound on her right lateral foot seems to be improving with local wound care. She has known significant right SFA disease. Her lower extremity Doppler will be repeated and according to the patient's daughter that appointment is on March 15. He is left with the thought that endovascular intervention in the right SFA might be necessary. The patient has quite a bit of pain especially at night related to the wound in her right foot. We changed her to Iodosorb ointment last week 12/13/16; this is a patient I follow every 2 weeks largely on a palliative approach at this point about ischemic wounds currently in the right foot. Initially she had them on her left lateral malleolus and left fifth toe. The area on the left lateral malleolus healed and the fifth toe has a surface eschar that I have elected not to remove. Both of these improved after revascularization by Dr. Fletcher Anon. We have been using Iodosorb to the right lateral foot not much change here. 12/27/16; the patient had her arterial studies. This showed known bilateral SFA  disease. Stable right ABI 0.5 to stable left ABI at 0.7 to the did not do it TBI on the right it was 0.61 on the left she has a stent in the long segment of her left SFA from 05/18/16. All of her wounds are somewhat better. She states her pain is better in the right foot is improved. 01/10/17- patient is here for follow-up dilation of her right lateral foot ulcer. She has been tolerating Iodosorb. She is voices no complaints or concerns 01/24/17; small ischemic wound on right lateral foot. still non viable cover. follows with Dr. Fletcher Anon on 4/30. No open area on left foot 02/07/17 doing well and states she is pain free. Saw Dr. Fletcher Anon who said she is doing well and has "50% flow in the right leg and 70% in the left. According to her daughter he does not wish to do any further interventions 02/21/17; small ischemic wound on the right lateral foot. Per her daughter and the patient she has no open wound on the left foot and ankle which were her initial presenting wounds. still has claudication type pain at night she is been to see interventional cardiology who does not feel that she has a need for intervention on the right leg at present. She still has claudication type pain in the right leg which she manages at night with when necessary tramadol that I have prescribed Tomassi, Masie ([id number]) Objective Constitutional Sitting or standing Blood Pressure is within target range for patient.. Pulse regular and within target range for patient.Marland Kitchen Respirations regular, non-labored and within target range.. Temperature is normal and within the target range for the patient.. Patient is in no distress. Vitals Time Taken: 9:51 AM, Height: 64 in, Weight: 158 lbs, BMI: 27.1, Temperature: 98.0 F, Pulse: 80 bpm, Respiratory Rate: 16 breaths/min, Blood  Pressure: 143/51 mmHg. Eyes Appear normal. Respiratory Respiratory effort is easy and symmetric bilaterally. Rate is normal at rest and on room  air.. Cardiovascular Femoral pulses palpable. Solace pedis pulses and posterior tibial pulses are absent luteal pulses absent. Musculoskeletal Does quite well with minor assistance of her daughter. Psychiatric No evidence of depression, anxiety, or agitation. Calm, cooperative, and communicative. Appropriate interactions and affect.. General Notes: Wound exam; the right lateral foot there is a small opening however under the light the actual opening of this wound is even smaller than that. Three quarters of the circumference of the wound at the surface appears to be epithelialized with only a small wound in the middle of this that has 0.2 cm in depth. There is no evidence of surrounding infection no tenderness Integumentary (Hair, Skin) No rashes seen. Wound #4 status is Open. Original cause of wound was Gradually Appeared. The wound is located on the Right,Lateral Foot. The wound measures 0.2cm length x 0.2cm width x 0.1cm depth; 0.031cm^2 area and 0.003cm^3 volume. The wound is limited to skin breakdown. There is no tunneling or undermining noted. There is a medium amount of serous drainage noted. The wound margin is distinct with the outline attached to the wound base. There is large (67-100%) pink granulation within the wound bed. There is a small (1-33%) amount of necrotic tissue within the wound bed including Adherent Slough. Periwound temperature was noted as No Abnormality. The periwound has tenderness on palpation. 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 Anesthetic: Wound #4 Right,Lateral Foot: Topical Lidocaine 4% cream applied to wound bed prior to debridement Skin Barriers/Peri-Wound Care: Wound #4 Right,Lateral Foot: Skin Prep Primary Wound Dressing: Wound #4 Right,Lateral Foot: Iodosorb Ointment Secondary Dressing: Wound #4 Right,Lateral Foot: Dry Gauze Boardered Foam Dressing Dressing Change Frequency: Wound #4 Right,Lateral Foot: Change dressing every other day. Follow-up Appointments: Wound #4 Right,Lateral Foot: Return Appointment in: - 3 weeks Edema Control: Wound #4 Right,Lateral Foot: Elevate legs to the level of the heart and pump ankles as often as possible Additional Orders / Instructions: Wound #4 Right,Lateral Foot: Increase protein intake. Medications-please add to medication list.: Wound #4 Right,Lateral Foot: Other: - Tramadol Yonan, Elener ([id number]) The following medication(s) was prescribed: tramadol oral 50 mg tablet q6h prn q6h prn tablet oral starting 02/21/2017 1 At this point I think there is little option but to continue the iosorb ointment 2 she has significant PAD in the right foot but Dr. Fletcher Anon did not want to pursue attempt at revscularization. 3 This may close over in a similar fashion to the right left lateral 5th toe and lateral malleolus or it may stay open inwhich case the iodosorb prevents seconedary infecton. change q2d 108f/u 3 weeks 4 escribe tramadol 50 q6hprn #40 Rx1 Electronic Signature(s) Signed: 02/21/2017 4:47:06 PM By: [name] MD Entered By: [name] on 02/21/2017 10:28:04 [name] ([id number]) -------------------------------------------------------------------------------- SuperBill Details Patient Name: [name] Date of Service: 02/21/2017 Medical Record Patient Account Number: [id number] Number: Treating RN: Ahmed Prima [birth year] (83 y.o. 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

## 2017-03-14 ENCOUNTER — Encounter: Payer: Medicare PPO | Attending: Internal Medicine | Admitting: Internal Medicine

## 2017-03-14 DIAGNOSIS — L97523 Non-pressure chronic ulcer of other part of left foot with necrosis of muscle: Secondary | ICD-10-CM | POA: Diagnosis not present

## 2017-03-14 DIAGNOSIS — I70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: Secondary | ICD-10-CM | POA: Diagnosis present

## 2017-03-14 DIAGNOSIS — I70243 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: Secondary | ICD-10-CM | POA: Diagnosis not present

## 2017-03-14 DIAGNOSIS — L97512 Non-pressure chronic ulcer of other part of right foot with fat layer exposed: Secondary | ICD-10-CM | POA: Insufficient documentation

## 2017-03-14 DIAGNOSIS — M1990 Unspecified osteoarthritis, unspecified site: Secondary | ICD-10-CM | POA: Insufficient documentation

## 2017-03-14 DIAGNOSIS — E039 Hypothyroidism, unspecified: Secondary | ICD-10-CM | POA: Insufficient documentation

## 2017-03-15 NOTE — Progress Notes (Signed)
[name], [name] ([id number]) Visit Report for 03/14/2017 Arrival Information Details Patient Name: [name], [name] Date of Service: 03/14/2017 9:45 AM Medical Record Patient Account Number: [id number] Number: Treating RN: Ahmed Prima 07-29-1919 (83 y.o. Other Clinician: Date of Birth/Sex: Female) Treating ROBSON, [place] Primary Care Aubryanna Nesheim: [employer] Cason Luffman/Extender: G Referring Janith Nielson: Vinson Moselle in Treatment: 45 Visit Information History Since Last Visit All ordered tests and consults were completed: No Patient Arrived: Gilford Rile Added or deleted any medications: No Arrival Time: 09:54 Any new allergies or adverse reactions: No Accompanied By: daughter Had a fall or experienced change in No Transfer Assistance: EasyPivot activities of daily living that may affect Patient Lift risk of falls: Patient Identification Verified: Yes Signs or symptoms of abuse/neglect since last No Secondary Verification Process Yes visito Completed: Hospitalized since last visit: No Patient Requires Transmission- No Has Dressing in Place as Prescribed: Yes Based Precautions: Pain Present Now: No Patient Has Alerts: Yes Electronic Signature(s) Signed: 03/14/2017 5:03:58 PM By: Alric Quan Entered By: Alric Quan on 03/14/2017 09:55:47 [name] ([id number]) -------------------------------------------------------------------------------- Clinic Level of Care Assessment Details Patient Name: [name] Date of Service: 03/14/2017 9:45 AM Medical Record Patient Account Number: [id number] Number: Treating RN: Ahmed Prima 1919-08-14 (83 y.o. Other Clinician: Date of Birth/Sex: Female) Treating ROBSON, [place] Primary Care Michaline Kindig: [employer]lee [employer] Rudra Hobbins/Extender: G Referring Makinzi Prieur: Vinson Moselle in Treatment: 95 Clinic Level of Care Assessment Items TOOL 4 Quantity Score X - Use when only an EandM  is performed on FOLLOW-UP visit 1 0 ASSESSMENTS - Nursing Assessment / Reassessment X - Reassessment of Co-morbidities (includes updates in patient status) 1 10 X - Reassessment of Adherence to Treatment Plan 1 5 ASSESSMENTS - Wound and Skin Assessment / Reassessment X - Simple Wound Assessment / Reassessment - one wound 1 5 []  - Complex Wound Assessment / Reassessment - multiple wounds 0 []  - Dermatologic / Skin Assessment (not related to wound area) 0 ASSESSMENTS - Focused Assessment []  - Circumferential Edema Measurements - multi extremities 0 []  - Nutritional Assessment / Counseling / Intervention 0 []  - Lower Extremity Assessment (monofilament, tuning fork, pulses) 0 []  - Peripheral Arterial Disease Assessment (using hand held doppler) 0 ASSESSMENTS - Ostomy and/or Continence Assessment and Care []  - Incontinence Assessment and Management 0 []  - Ostomy Care Assessment and Management (repouching, etc.) 0 PROCESS - Coordination of Care X - Simple Patient / Family Education for ongoing care 1 15 []  - Complex (extensive) Patient / Family Education for ongoing care 0 []  - Staff obtains Programmer, systems, Records, Test Results / Process Orders 0 []  - Staff telephones HHA, Nursing Homes / Clarify orders / etc 0 Arechiga, Rossie ([id number]) []  - Routine Transfer to another Facility (non-emergent condition) 0 []  - Routine Hospital Admission (non-emergent condition) 0 []  - New Admissions / Biomedical engineer / Ordering NPWT, Apligraf, etc. 0 []  - Emergency Hospital Admission (emergent condition) 0 X - Simple Discharge Coordination 1 10 []  - Complex (extensive) Discharge Coordination 0 PROCESS - Special Needs []  - Pediatric / Minor Patient Management 0 []  - Isolation Patient Management 0 []  - Hearing / Language / Visual special needs 0 []  - Assessment of Community assistance (transportation, D/C planning, etc.) 0 []  - Additional assistance / Altered mentation 0 []  - Support Surface(s)  Assessment (bed, cushion, seat, etc.) 0 INTERVENTIONS - Wound Cleansing / Measurement X - Simple Wound Cleansing - one wound 1 5 []  - Complex Wound Cleansing - multiple wounds 0 X - Wound Imaging (photographs -  any number of wounds) 1 5 []  - Wound Tracing (instead of photographs) 0 X - Simple Wound Measurement - one wound 1 5 []  - Complex Wound Measurement - multiple wounds 0 INTERVENTIONS - Wound Dressings X - Small Wound Dressing one or multiple wounds 1 10 []  - Medium Wound Dressing one or multiple wounds 0 []  - Large Wound Dressing one or multiple wounds 0 X - Application of Medications - topical 1 5 []  - Application of Medications - injection 0 [name], [name] ([id number]) INTERVENTIONS - Miscellaneous []  - External ear exam 0 []  - Specimen Collection (cultures, biopsies, blood, body fluids, etc.) 0 []  - Specimen(s) / Culture(s) sent or taken to Lab for analysis 0 []  - Patient Transfer (multiple staff / Harrel Lemon Lift / Similar devices) 0 []  - Simple Staple / Suture removal (25 or less) 0 []  - Complex Staple / Suture removal (26 or more) 0 []  - Hypo / Hyperglycemic Management (close monitor of Blood Glucose) 0 []  - Ankle / Brachial Index (ABI) - do not check if billed separately 0 X - Vital Signs 1 5 Has the patient been seen at the hospital within the last three years: Yes Total Score: 80 Level Of Care: New/Established - Level 3 Electronic Signature(s) Signed: 03/14/2017 5:03:58 PM By: Alric Quan Entered By: Alric Quan on 03/14/2017 11:08:08 [name] ([id number]) -------------------------------------------------------------------------------- Encounter Discharge Information Details Patient Name: [name] Date of Service: 03/14/2017 9:45 AM Medical Record Patient Account Number: [id number] Number: Treating RN: Carolyne Fiscal, Debi Sep 12, 1919 (83 y.o. Other Clinician: Date of Birth/Sex: Female) Treating ROBSON, [place]egin[place] Primary Care Rohin Krejci:  [employer]orelee [employer] Bleu Moisan/Extender: G Referring Dominyk Law: Vinson Moselle in Treatment: 48 Encounter Discharge Information Items Discharge Pain Level: 0 Discharge Condition: Stable Ambulatory Status: Walker Discharge Destination: Home Private Transportation: Auto Accompanied By: daughter Schedule Follow-up Appointment: Yes Medication Reconciliation completed and No provided to Patient/Care Joachim Carton: Patient Clinical Summary of Care: Declined Electronic Signature(s) Signed: 03/14/2017 10:17:45 AM By: Sharon Mt Entered By: Sharon Mt on 03/14/2017 10:17:45 [name] ([id number]) -------------------------------------------------------------------------------- Lower Extremity Assessment Details Patient Name: [name] Date of Service: 03/14/2017 9:45 AM Medical Record Patient Account Number: [id number] Number: Treating RN: Ahmed Prima 22-Nov-1918 (83 y.o. Other Clinician: Date of Birth/Sex: Female) Treating ROBSON, MICHAEL Primary Care Lillyanna Glandon: [place]orelee [employer] Karolee Meloni/Extender: G Referring Briggs Edelen: Lorelee Market Weeks in Treatment: 48 Vascular Assessment Pulses: Dorsalis Pedis Palpable: [Right:Yes] Posterior Tibial Extremity colors, hair growth, and conditions: Extremity Color: [Right:Mottled] Temperature of Extremity: [Right:Warm] Capillary Refill: [Right:< 3 seconds] Toe Nail Assessment Left: Right: Thick: No Discolored: No Deformed: No Improper Length and Hygiene: No Electronic Signature(s) Signed: 03/14/2017 5:03:58 PM By: Alric Quan Entered By: Alric Quan on 03/14/2017 10:01:05 [name] ([id number]) -------------------------------------------------------------------------------- Multi Wound Chart Details Patient Name: [name] Date of Service: 03/14/2017 9:45 AM Medical Record Patient Account Number: [id number] Number: Treating RN: Ahmed Prima 1918-12-05 (83 y.o. Other  Clinician: Date of Birth/Sex: Female) Treating ROBSON, MICHAEL Primary Care Monique Hefty: Lorelee Market Sussie Minor/Extender: G Referring Candice Lunney: Lorelee Market Weeks in Treatment: 48 Vital Signs Height(in): 64 Pulse(bpm): 80 Weight(lbs): 158 Blood Pressure 148/45 (mmHg): Body Mass Index(BMI): 27 Temperature(F): 97.8 Respiratory Rate 16 (breaths/min): Photos: [N/A:N/A] Wound Location: Right Foot - Lateral N/A N/A Wounding Event: Gradually Appeared N/A N/A Primary Etiology: Arterial Insufficiency Ulcer N/A N/A Comorbid History: Cataracts, Hypertension, N/A N/A Osteoarthritis Date Acquired: 07/26/2016 N/A N/A Weeks of Treatment: 32 N/A N/A Wound Status: Open N/A N/A Measurements L x W x D 0.1x0.1x0.1 N/A N/A (cm) Area (cm) :  0.008 N/A N/A Volume (cm) : 0.001 N/A N/A % Reduction in Area: 83.00% N/A N/A % Reduction in Volume: 80.00% N/A N/A Classification: Partial Thickness N/A N/A Exudate Amount: Medium N/A N/A Exudate Type: Serous N/A N/A Exudate Color: amber N/A N/A Wound Margin: Distinct, outline attached N/A N/A Granulation Amount: Large (67-100%) N/A N/A Granulation Quality: Pink N/A N/A [name], [name] ([id number]) Necrotic Amount: Small (1-33%) N/A N/A Exposed Structures: Fascia: No N/A N/A Fat Layer (Subcutaneous Tissue) Exposed: No Tendon: No Muscle: No Joint: No Bone: No Limited to Skin Breakdown Epithelialization: Large (67-100%) N/A N/A Periwound Skin Texture: No Abnormalities Noted N/A N/A Periwound Skin No Abnormalities Noted N/A N/A Moisture: Periwound Skin Color: No Abnormalities Noted N/A N/A Temperature: No Abnormality N/A N/A Tenderness on Yes N/A N/A Palpation: Wound Preparation: Ulcer Cleansing: N/A N/A Rinsed/Irrigated with Saline Topical Anesthetic Applied: Other: lidocaine 4% Treatment Notes Wound #4 (Right, Lateral Foot) 1. Cleansed with: Clean wound with Normal Saline 2. Anesthetic Topical Lidocaine 4% cream to  wound bed prior to debridement 3. Peri-wound Care: Skin Prep 5. Secondary Dressing Applied Bordered Foam Dressing Dry Gauze Electronic Signature(s) Signed: 03/14/2017 5:27:39 PM By: [name] MD Entered By: [name] on 03/14/2017 10:28:29 [name] ([id number]) -------------------------------------------------------------------------------- Multi-Disciplinary Care Plan Details Patient Name: [name] Date of Service: 03/14/2017 9:45 AM Medical Record Patient Account Number: [id number] Number: Treating RN: Ahmed Prima August 17, 1919 (83 y.o. Other Clinician: Date of Birth/Sex: Female) Treating ROBSON, MICHAEL Primary Care Moncerrat Burnstein: [place]lee [employer] Jamair Cato/Extender: G Referring Yahsir Wickens: Vinson Moselle in Treatment: 19 Active Inactive ` Abuse / Safety / Falls / Self Care Management Nursing Diagnoses: Potential for falls Goals: Patient will remain injury free Date Initiated: 04/06/2016 Target Resolution Date: 12/22/2016 Goal Status: Active Interventions: Assess fall risk on admission and as needed Notes: ` Nutrition Nursing Diagnoses: Imbalanced nutrition Goals: Patient/caregiver agrees to and verbalizes understanding of need to use nutritional supplements and/or vitamins as prescribed Date Initiated: 04/06/2016 Target Resolution Date: 12/22/2016 Goal Status: Active Interventions: Assess patient nutrition upon admission and as needed per policy Notes: ` Orientation to the [place], Jaimy ([id number]) Nursing Diagnoses: Knowledge deficit related to the wound healing center program Goals: Patient/caregiver will verbalize understanding of the [facility] Program Date Initiated: 04/06/2016 Target Resolution Date: 12/22/2016 Goal Status: Active Interventions: Provide education on orientation to the wound center Notes: ` Pain, Acute or Chronic Nursing Diagnoses: Pain, acute or chronic: actual  or potential Potential alteration in comfort, pain Goals: Patient will verbalize adequate pain control and receive pain control interventions during procedures as needed Date Initiated: 04/06/2016 Target Resolution Date: 12/22/2016 Goal Status: Active Interventions: Assess comfort goal upon admission Complete pain assessment as per visit requirements Notes: ` Wound/Skin Impairment Nursing Diagnoses: Impaired tissue integrity Goals: Ulcer/skin breakdown will have a volume reduction of 30% by week 4 Date Initiated: 04/06/2016 Target Resolution Date: 12/22/2016 Goal Status: Active Ulcer/skin breakdown will have a volume reduction of 50% by week 8 Date Initiated: 04/06/2016 Target Resolution Date: 12/22/2016 Goal Status: Active Ulcer/skin breakdown will have a volume reduction of 80% by week 12 KEMISHA, BONNETTE ([id number]) Date Initiated: 04/06/2016 Target Resolution Date: 12/22/2016 Goal Status: Active Interventions: Assess patient/caregiver ability to obtain necessary supplies Assess ulceration(s) every visit Notes: Electronic Signature(s) Signed: 03/14/2017 5:03:58 PM By: Alric Quan Entered By: Alric Quan on 03/14/2017 10:01:26 [name] ([id number]) -------------------------------------------------------------------------------- Pain Assessment Details Patient Name: [name] Date of Service: 03/14/2017 9:45 AM Medical Record Patient Account Number: [id number] Number: Treating RN: Carolyne Fiscal,  Debi 1919-05-17 (83 y.o. Other Clinician: Date of Birth/Sex: Female) Treating ROBSON, MICHAEL Primary Care Lyell Clugston: [place]orelee [employer] Alain Deschene/Extender: G Referring Eliane Hammersmith: Vinson Moselle in Treatment: 48 Active Problems Location of Pain Severity and Description of Pain Patient Has Paino No Site Locations With Dressing Change: No Pain Management and Medication Current Pain Management: Electronic Signature(s) Signed: 03/14/2017 5:03:58  PM By: Alric Quan Entered By: Alric Quan on 03/14/2017 09:55:55 [name] ([id number]) -------------------------------------------------------------------------------- Patient/Caregiver Education Details Patient Name: [name] Date of Service: 03/14/2017 9:45 AM Medical Record Patient Account Number: [id number] Number: Treating RN: Carolyne Fiscal, Debi 1919/06/16 (83 y.o. Other Clinician: Date of Birth/Gender: Female) Treating ROBSON, [place] Primary Care Physician: Lorelee Market Physician/Extender: G Referring Physician: Vinson Moselle in Treatment: 14 Education Assessment Education Provided To: Patient Education Topics Provided Wound/Skin Impairment: Handouts: Other: change dressing as ordered Methods: Demonstration, Explain/Verbal Responses: State content correctly Electronic Signature(s) Signed: 03/14/2017 5:03:58 PM By: Alric Quan Entered By: Alric Quan on 03/14/2017 10:05:26 [name] ([id number]) -------------------------------------------------------------------------------- Wound Assessment Details Patient Name: [name] Date of Service: 03/14/2017 9:45 AM Medical Record Patient Account Number: [id number] Number: Treating RN: Ahmed Prima [birth year] (83 y.o. Other Clinician: Date of Birth/Sex: Female) Treating ROBSON, [place] Primary Care Staisha Winiarski: [place]lee [employer] Mileena Rothenberger/Extender: G Referring Shakiyah Cirilo: Lorelee [employer] Weeks in Treatment: 32 Wound Status Wound Number: 4 Primary Arterial Insufficiency Ulcer Etiology: Wound Location: Right Foot - Lateral Wound Status: Open Wounding Event: Gradually Appeared Comorbid Cataracts, Hypertension, Date Acquired: 07/26/2016 History: Osteoarthritis Weeks Of Treatment: 32 Clustered Wound: No Photos Photo Uploaded By: Alric Quan on 03/14/2017 10:19:20 Wound Measurements Length: (cm) 0.1 Width: (cm) 0.1 Depth: (cm)  0.1 Area: (cm) 0.008 Volume: (cm) 0.001 % Reduction in Area: 83% % Reduction in Volume: 80% Epithelialization: Large (67-100%) Tunneling: No Undermining: No Wound Description Classification: Partial Thickness Foul Odor Aft Wound Margin: Distinct, outline attached Slough/Fibrin Exudate Amount: Medium Exudate Type: Serous Exudate Color: amber er Cleansing: No o Yes Wound Bed Granulation Amount: Large (67-100%) Exposed Structure Granulation Quality: Pink Fascia Exposed: No Necrotic Amount: Small (1-33%) Fat Layer (Subcutaneous Tissue) Exposed: No Iwata, Yanique ([id number]) Necrotic Quality: Adherent Slough Tendon Exposed: No Muscle Exposed: No Joint Exposed: No Bone Exposed: No Limited to Skin Breakdown Periwound Skin Texture Texture Color No Abnormalities Noted: No No Abnormalities Noted: No Moisture Temperature / Pain No Abnormalities Noted: No Temperature: No Abnormality Tenderness on Palpation: Yes Wound Preparation Ulcer Cleansing: Rinsed/Irrigated with Saline Topical Anesthetic Applied: Other: lidocaine 4%, Treatment Notes Wound #4 (Right, Lateral Foot) 1. Cleansed with: Clean wound with Normal Saline 2. Anesthetic Topical Lidocaine 4% cream to wound bed prior to debridement 3. Peri-wound Care: Skin Prep 5. Secondary Dressing Applied Bordered Foam Dressing Dry Gauze Electronic Signature(s) Signed: 03/14/2017 5:03:58 PM By: Alric Quan Entered By: Alric Quan on 03/14/2017 10:00:39 [name] ([id number]) -------------------------------------------------------------------------------- Vitals Details Patient Name: [name] Date of Service: 03/14/2017 9:45 AM Medical Record Patient Account Number: [id number] Number: Treating RN: Ahmed Prima 04-29-1919 (83 y.o. Other Clinician: Date of Birth/Sex: Female) Treating ROBSON, MICHAEL Primary Care Leilan Bochenek: [place]lee [employer] Almena Hokenson/Extender: G Referring Davona Kinoshita:  Lorelee [employer] Weeks in Treatment: 48 Vital Signs Time Taken: 09:55 Temperature (F): 97.8 Height (in): 64 Pulse (bpm): 80 Weight (lbs): 158 Respiratory Rate (breaths/min): 16 Body Mass Index (BMI): 27.1 Blood Pressure (mmHg): 148/45 Reference Range: 80 - 120 mg / dl Electronic Signature(s) Signed: 03/14/2017 5:03:58 PM By: Alric Quan Entered By: Alric Quan on 03/14/2017 09:57:55

## 2017-03-15 NOTE — Progress Notes (Signed)
[name], [name] ([id number]) Visit Report for 03/14/2017 Chief Complaint Document Details Patient Name: [name], [name] Date of Service: 03/14/2017 9:45 AM Medical Record Patient Account Number: [id number] Number: Treating RN: Ahmed Prima 1918-12-23 (83 y.o. Other Clinician: Date of Birth/Sex: Female) Treating Oliver Heitzenrater Primary Care Provider: Lorelee Market Provider/Extender: G Referring Provider: Vinson Moselle in Treatment: 48 Information Obtained from: Patient Chief Complaint here for follow-up on right lateral foot ulcer Electronic Signature(s) Signed: 03/14/2017 5:27:39 PM By: [name] MD Entered By: [name] on 03/14/2017 10:28:51 [name] ([id number]) -------------------------------------------------------------------------------- HPI Details Patient Name: [name] Date of Service: 03/14/2017 9:45 AM Medical Record Patient Account Number: [id number] Number: Treating RN: Ahmed Prima 1919/08/31 (83 y.o. Other Clinician: Date of Birth/Sex: Female) Treating Nishita Isaacks Primary Care Provider: Lorelee Market Provider/Extender: G Referring Provider: Lorelee Market Weeks in Treatment: 86 History of Present Illness HPI Description: 04/06/16; this is a 83 year old woman who arrives accompanied by 2 daughters for a wound on her left ankle and her left fifth toe. These have apparently been present for a year. I'm not quite certain how she came to this clinic however she was being followed by Sharlotte Alamo her podiatrist for these wounds. She was also referred to [facility] and they apparently did a test presumably arterial studies although we don't have any of these results and we couldn't get through to the office today. The family but has been applying a combination of Bactroban and a light bandage and perhaps more recently Silvadene cream. She did have an x-ray of the foot roughly 6  months ago at the podiatry office the family was unaware that if there were any abnormalities. Apparently they have not seen any healing here. Our intake nurse noted a slight skin tear on the right anterior lower leg. Nobody seemed aware of this. ABIs calculated in this clinic was 0.3 on the right and 0.4 on the left I have reviewed things [facility]. There is very little information on this patient. She apparently follows in current total clinic which we don't have information from. She has mentioned already been to a [facility]. She has a history of hypothyroidism, nephrolithiasis arthritis and has had a previous mastectomy. 04/13/16; patient's x-ray was normal. She is already been to see Dr. Delana Meyer vascular surgery. Her arterial exam was from November 2016 this showed a left ABI of 0.62 her right of 0.76. Her duplex ultrasound of the left leg showed biphasic waves in the common femoral and distal femoral artery however monophasic waves in the superficial femoral artery proximal and mid biphasic it distal. 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 The patient tells me that she has pain at night when she tries to lie down which is improved by getting up and sitting in the chair this sounds like claudication at rest. Her wounds are on the left medial malleolus and the dorsal fifth toe small punched out wounds that are right on bone. We use Santyl last week 04/20/16: nurse informed me pt has declined evaluation for significant PAD. she denies systemic s/s of infections. 04/27/16;; the patient has had noninvasive arterial studies done in November 2016. ABI and the left was 0.62. Monophasic waves at the superficial femoral artery. Occluded to the posterior tibial artery. So had greater than 50% stenosis of the right superficial femoral and greater than 50% stenosis of the left superficial femoral artery. She had bilateral tibial peroneal artery disease. Both of the wounds on the left fifth toe and  left lateral malleolus have been present for more than a year. They have been to see vein and vascular. The patient has some pain but miraculously I think the wounds have largely been stable. No evidence of infection 05/04/16; she goes for a noninvasive study tomorrow and then sees Dr. Fletcher Anon on Monday. By the time she is here next week we should have a better picture of whether something can be done with regards to her arterial flow. We continue to have ischemic-looking wounds on the left fifth toe and left lateral malleolus. 05/10/16; the patient went for her arterial studies and saw Dr. Fletcher Anon. As predicted she is felt to have critical limb ischemia. The feeling is that she has occlusion of the SFA. The feeling would she would be a Patient, [name] ([id number]) candidate for a stent to the SFA. The patient did not make a decision to proceed with the procedure and she is here with family members to discuss this me today. He shouldn't has a lot of pain and cannot sleep and rest well at night per her family. 05/24/16; the patient went and had a complex revascularization/angioplasty of the left superficial femoral artery followed by drug-coated balloon angioplasty and spots stenting. She tolerated the procedure well. She was recommended for dual antiplatelet drugs with Plavix and aspirin for at least a month. She went yesterday for I believe follow-up serial Dopplers and ABIs although I don't see these results. The patient is unfortunately complaining of a lot of pain in her bilateral lower legs below the knees from the ankle to the knees. She apparently was prescribed lidocaine and apparently put this on her legs instead of over the wound areas. This may have something to do with it however there is a lot of edema in her bilateral legs I was able to find her arterial studies from yesterday. The left ABI has improved up to 0.66 post left SFA stent. The bilateral great toe indices remain abnormal with the  left being in the 0.25 range. Duplex ultrasound showed her left SFA stent is patent monophasic waveforms persist in the left leg 06/07/16; continued punched-out areas over the dorsal left fifth toe and left lateral malleolus. No major improvement 06/28/16; the areas over her dorsal left fifth toe and left lateral malleolus are covered in surface slough we are using Iodoflex 07/05/16. We have been using Iodoflex for 2-3 weeks now. I have not been debridement is because of pain 07/19/16 currently we have been using Iodoflex for roughly the past month. Previously we were unable to debride due to pain although today patient states that the pain is not nearly as severe as it has been in the past. In fact she rates this to be a 1 out of 10 and it worse to a to 10 with palpation of the wound. All and all her and her daughter feel like this is actually doing steadily better at this point in time. She is pleased with progress and we have been seeing her every 2 weeks. 08/02/16; this is a delightful 83 year old woman I have not seen in over a month. She has arterial insufficiency wounds remaining over the left lateral malleolus and the left fifth toe. With the help of Dr. Fletcher Anon we are able to get her revascularized on the left. The 2 wounds on the left have been making good progress and are definitely smaller especially the area over the left lateral malleolus. She is certainly in a lot less pain than she used to be  although I still think there is some claudication type pain. Unfortunately she is developed a area on the right lateral foot which is a small open area but I think is threatened. I suspect a small ischemic areas well. Also on her right fifth toe there is an area that is not open but looks as though it is receiving too much pressure from footwear. Finally an area over her right malleolus although I don't think this is on its way to anything ominous 08/17/16 patient presents today for follow up  evaluation she tells me that she is really doing fairly well from a pain standpoint compared to where she has been previous. She is tolerating the dressing changes without any complication. She continues to have discharge and drainage however. 08-30-16 Ms. Altier presents today with her daughter, she states that she continues to have intermittent shooting pains to the left lateral fifth toe at this site of seems to be a healed ulcer. She denies any other issues that her wound related since her last appointment. Her daughter states that she is in need of a tramadol refill at this time as she uses half a tablet at at bedtime to aid in sleep due to foot pain. Iodoflex has been used on all wounds in previous dressing changes. 09/13/16; the patient has ischemic wounds in her feet. The area over the left lateral malleolus is healed and the area over the left fifth toe looks improved.. She has an area on the lateral aspect of her right foot which is a small but deep wound. Finally she has a new open area on the medial aspect of the left medial malleolus. This is superficial 09/27/16; the area over the left lateral malleolus remains healed. The area over the left fifth toe has a surface and she states the pain is better but I don't think this is completely closed. She has an area on the lateral aspect of her right foot is a small but deep wound. The new open area on the medial aspect of the [facility], Tanith ([id number]) left ankle was closed from last time. 10/18/16; the area over her left lateral malleolus remains healed. The area over the left fifth toe has a surface over the top of this however there is no overt open area. Given the underlying issues of severe PAD and continued pain in the toe I would think it would be unlikely this is truly healed however I am not planning to debridement this area. The area on the right lateral foot which was a more recent wound is a small punched out painful area  again has significant surface slough and nonviable tissue. It is clear the patient still has claudication type pain however she remains functional. I have been giving her tramadol when necessary and that seems to help a lot with her pain the patient follows up with Dr. Fletcher Anon on January 18/18 11/01/16; patient missed her follow-up with Dr. Fletcher Anon last week due to a snow day. Follow-up is now on February 15. The areas on the right lateral malleolus and dorsal right fifth toe remain closed over. The fifth toe was tentative is there is a surface eschar however I'm not going to disturb this. Therefore, her only open areas on the right lateral foot. This is a small punched out area. We have been using Prisma. I'm quite convinced this is an ischemic wound 11/15/16; patient has follow-up with Dr. Fletcher Anon on February 15. She has no open wound on the left leg and  doesn't really complain of claudication that I can determine from talking to her. However on the right leg she clearly has some degree of claudication. The remaining open wound is on the right lateral foot. We have been using Iodosorb ointment 11/29/16; patient saw Dr. Fletcher Anon on 11/24/16. His comment is that her wound on her right lateral foot seems to be improving with local wound care. She has known significant right SFA disease. Her lower extremity Doppler will be repeated and according to the patient's daughter that appointment is on March 15. He is left with the thought that endovascular intervention in the right SFA might be necessary. The patient has quite a bit of pain especially at night related to the wound in her right foot. We changed her to Iodosorb ointment last week 12/13/16; this is a patient I follow every 2 weeks largely on a palliative approach at this point about ischemic wounds currently in the right foot. Initially she had them on her left lateral malleolus and left fifth toe. The area on the left lateral malleolus healed and the fifth  toe has a surface eschar that I have elected not to remove. Both of these improved after revascularization by Dr. Fletcher Anon. We have been using Iodosorb to the right lateral foot not much change here. 12/27/16; the patient had her arterial studies. This showed known bilateral SFA disease. Stable right ABI 0.5 to stable left ABI at 0.7 to the did not do it TBI on the right it was 0.61 on the left she has a stent in the long segment of her left SFA from 05/18/16. All of her wounds are somewhat better. She states her pain is better in the right foot is improved. 01/10/17- patient is here for follow-up dilation of her right lateral foot ulcer. She has been tolerating Iodosorb. She is voices no complaints or concerns 01/24/17; small ischemic wound on right lateral foot. still non viable cover. follows with Dr. Fletcher Anon on 4/30. No open area on left foot 02/07/17 doing well and states she is pain free. Saw Dr. Fletcher Anon who said she is doing well and has "50% flow in the right leg and 70% in the left. According to her daughter he does not wish to do any further interventions 02/21/17; small ischemic wound on the right lateral foot. Per her daughter and the patient she has no open wound on the left foot and ankle which were her initial presenting wounds. still has claudication type pain at night she is been to see interventional cardiology who does not feel that she has a need for intervention on the right leg at present. She still has claudication type pain in the right leg which she manages at night with when necessary tramadol that I have prescribed 03/14/17; small ischemic wound on the right lateral foot. They've been dressing this at home with Iodosorb ointment. The patient does not complain of any pain. The wound has a surface eschar over it and there is really no visible open area. This is similar to how the areas on the left leg healed Electronic Signature(s) MARIZOL, BORROR ([id number]) Signed: 03/14/2017 5:27:39  PM By: [name] MD Entered By: [name] on 03/14/2017 10:29:45 [name] ([id number]) -------------------------------------------------------------------------------- Physical Exam Details Patient Name: [name] Date of Service: 03/14/2017 9:45 AM Medical Record Patient Account Number: [id number] Number: Treating RN: Ahmed Prima [birth year] (83 y.o. Other Clinician: Date of Birth/Sex: Female) Treating Hanley Woerner, [place] Primary Care Provider: Lorelee Market Provider/Extender: G Referring Provider: Lorelee Market  Weeks in Treatment: 5 Constitutional Patient is hypertensive.. Pulse regular and within target range for patient.Marland Kitchen Respirations regular, non-labored and within target range.. Temperature is normal and within the target range for the patient.Marland Kitchen appears in no distress. Cardiovascular . Notes Wound exam; the right lateral foot does not have a visible opening. There appears to be surface eschar here however I did not go ahead and debride this. There is no evidence of surrounding infection. Electronic Signature(s) Signed: 03/14/2017 5:27:39 PM By: [name] MD Entered By: [name] on 03/14/2017 10:30:58 [name] ([id number]) -------------------------------------------------------------------------------- Physician Orders Details Patient Name: [name] Date of Service: 03/14/2017 9:45 AM Medical Record Patient Account Number: [id number] Number: Treating RN: Ahmed Prima 03/22/1919 (83 y.o. Other Clinician: Date of Birth/Sex: Female) Treating Kirstina Leinweber Primary Care Provider: Lorelee Market Provider/Extender: G Referring Provider: Vinson Moselle in Treatment: 2 Verbal / Phone Orders: Yes Clinician: Carolyne Fiscal, Debi Read Back and Verified: Yes Diagnosis Coding Wound Cleansing Wound #4 Right,Lateral Foot o Clean wound with Normal Saline. Anesthetic Wound #4  Right,Lateral Foot o Topical Lidocaine 4% cream applied to wound bed prior to debridement Skin Barriers/Peri-Wound Care Wound #4 Right,Lateral Foot o Skin Prep Secondary Dressing Wound #4 Right,Lateral Foot o Dry Gauze o Boardered Foam Dressing Dressing Change Frequency Wound #4 Right,Lateral Foot o Change dressing every other day. Follow-up Appointments Wound #4 Right,Lateral Foot o Return Appointment in 1 month Edema Control Wound #4 Right,Lateral Foot o Elevate legs to the level of the heart and pump ankles as often as possible Additional Orders / Instructions Wound #4 Right,Lateral Foot o Increase protein intake. MARLYCE, MCDOUGALD ([id number]) Medications-please add to medication list. Wound #4 Right,Lateral Foot o Other: - Tramadol Electronic Signature(s) Signed: 03/14/2017 5:03:58 PM By: Alric Quan Signed: 03/14/2017 5:27:39 PM By: [name] MD Entered By: Alric Quan on 03/14/2017 10:11:27 [name] ([id number]) -------------------------------------------------------------------------------- Problem List Details Patient Name: [name] Date of Service: 03/14/2017 9:45 AM Medical Record Patient Account Number: [id number] Number: Treating RN: Ahmed Prima 06/24/1919 (83 y.o. 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 Other Clinician: Date of Birth/Sex: Female) Treating Starnisha Batrez Primary Care Provider: Lorelee Market Provider/Extender: G Referring Provider: Vinson Moselle in Treatment: 48 Subjective Chief Complaint Information obtained from Patient here for follow-up on right lateral foot ulcer History of Present Illness (HPI) 04/06/16; this is a 83 year old woman who arrives accompanied by 2 daughters for a wound on her left ankle and her left fifth toe. These have apparently been present for a year. I'm not quite certain how she came to this clinic however she was being followed by Sharlotte Alamo her podiatrist for these wounds. She was also  referred to [facility] and they apparently did a test presumably arterial studies although we don't have any of these results and we couldn't get through to the office today. The family but has been applying a combination of Bactroban and a light bandage and perhaps more recently Silvadene cream. She did have an x-ray of the foot roughly 6 months ago at the podiatry office the family was unaware that if there were any abnormalities. Apparently they have not seen any healing here. Our intake nurse noted a slight skin tear on the right anterior lower leg. Nobody seemed aware of this. ABIs calculated in this clinic was 0.3 on the right and 0.4 on the left I have reviewed things [facility]. There is very little  information on this patient. She apparently follows in current total clinic which we don't have information from. She has mentioned already been to a [facility]. She has a history of hypothyroidism, nephrolithiasis arthritis and has had a previous mastectomy. 04/13/16; patient's x-ray was normal. She is already been to see Dr. Delana Meyer vascular surgery. Her arterial exam was from November 2016 this showed a left ABI of 0.62 her right of 0.76. Her duplex ultrasound of the left leg showed biphasic waves in the common femoral and distal femoral artery however monophasic waves in the superficial femoral artery proximal and mid biphasic it distal. 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 The patient tells me that she has pain at night when she tries to lie down which is improved by getting up and sitting in the chair this sounds like claudication at rest. Her wounds are on the left medial malleolus and the dorsal fifth toe small punched out wounds that are right on bone. We use Santyl last week 04/20/16: nurse informed me pt has declined evaluation for significant PAD. she denies systemic s/s of infections. 04/27/16;; the patient has had noninvasive arterial studies done in November 2016. ABI and the left was 0.62. Monophasic waves at the superficial femoral artery. Occluded to the posterior tibial artery. So had greater than 50% stenosis of the right superficial femoral and greater than 50% stenosis of the left superficial femoral artery. She had bilateral tibial peroneal artery disease. Both of the wounds on the left fifth toe and left lateral malleolus have been present for more than a year. They have been to see vein and vascular. The patient has some pain but miraculously I think the wounds have largely been stable. No Camilo, Maleeya ([id number]) evidence of infection 05/04/16; she goes for a noninvasive study tomorrow and then sees Dr. Fletcher Anon on Monday. By the time she is here next week we should have a  better picture of whether something can be done with regards to her arterial flow. We continue to have ischemic-looking wounds on the left fifth toe and left lateral malleolus. 05/10/16; the patient went for her arterial studies and saw Dr. Fletcher Anon. As predicted she is felt to have critical limb ischemia. The feeling is that she has occlusion of the SFA. The feeling would she would be a candidate for a stent to the SFA. The patient did not make a decision to proceed with the procedure and she is here with family members to discuss this me today. He shouldn't has a lot of pain and cannot sleep and rest well at night per her family. 05/24/16; the patient went and had a complex revascularization/angioplasty of the left superficial femoral artery followed by drug-coated balloon angioplasty and spots stenting. She tolerated  the procedure well. She was recommended for dual antiplatelet drugs with Plavix and aspirin for at least a month. She went yesterday for I believe follow-up serial Dopplers and ABIs although I don't see these results. The patient is unfortunately complaining of a lot of pain in her bilateral lower legs below the knees from the ankle to the knees. She apparently was prescribed lidocaine and apparently put this on her legs instead of over the wound areas. This may have something to do with it however there is a lot of edema in her bilateral legs I was able to find her arterial studies from yesterday. The left ABI has improved up to 0.66 post left SFA stent. The bilateral great toe indices remain abnormal with the left being in the 0.25 range. Duplex ultrasound showed her left SFA stent is patent monophasic waveforms persist in the left leg 06/07/16; continued punched-out areas over the dorsal left fifth toe and left lateral malleolus. No major improvement 06/28/16; the areas over her dorsal left fifth toe and left lateral malleolus are covered in surface slough we are using  Iodoflex 07/05/16. We have been using Iodoflex for 2-3 weeks now. I have not been debridement is because of pain 07/19/16 currently we have been using Iodoflex for roughly the past month. Previously we were unable to debride due to pain although today patient states that the pain is not nearly as severe as it has been in the past. In fact she rates this to be a 1 out of 10 and it worse to a to 10 with palpation of the wound. All and all her and her daughter feel like this is actually doing steadily better at this point in time. She is pleased with progress and we have been seeing her every 2 weeks. 08/02/16; this is a delightful 83 year old woman I have not seen in over a month. She has arterial insufficiency wounds remaining over the left lateral malleolus and the left fifth toe. With the help of Dr. Fletcher Anon we are able to get her revascularized on the left. The 2 wounds on the left have been making good progress and are definitely smaller especially the area over the left lateral malleolus. She is certainly in a lot less pain than she used to be although I still think there is some claudication type pain. Unfortunately she is developed a area on the right lateral foot which is a small open area but I think is threatened. I suspect a small ischemic areas well. Also on her right fifth toe there is an area that is not open but looks as though it is receiving too much pressure from footwear. Finally an area over her right malleolus although I don't think this is on its way to anything ominous 08/17/16 patient presents today for follow up evaluation she tells me that she is really doing fairly well from a pain standpoint compared to where she has been previous. She is tolerating the dressing changes without any complication. She continues to have discharge and drainage however. 08-30-16 Ms. Augello presents today with her daughter, she states that she continues to have intermittent shooting pains to  the left lateral fifth toe at this site of seems to be a healed ulcer. She denies any other issues that her wound related since her last appointment. Her daughter states that she is in need of a tramadol refill at this time as she uses half a tablet at at bedtime to aid in sleep due to foot pain.  Iodoflex has been used on all wounds in previous dressing changes. 09/13/16; the patient has ischemic wounds in her feet. The area over the left lateral malleolus is healed and Gertz, Charlynn ([id number]) the area over the left fifth toe looks improved.. She has an area on the lateral aspect of her right foot which is a small but deep wound. Finally she has a new open area on the medial aspect of the left medial malleolus. This is superficial 09/27/16; the area over the left lateral malleolus remains healed. The area over the left fifth toe has a surface and she states the pain is better but I don't think this is completely closed. She has an area on the lateral aspect of her right foot is a small but deep wound. The new open area on the medial aspect of the left ankle was closed from last time. 10/18/16; the area over her left lateral malleolus remains healed. The area over the left fifth toe has a surface over the top of this however there is no overt open area. Given the underlying issues of severe PAD and continued pain in the toe I would think it would be unlikely this is truly healed however I am not planning to debridement this area. The area on the right lateral foot which was a more recent wound is a small punched out painful area again has significant surface slough and nonviable tissue. It is clear the patient still has claudication type pain however she remains functional. I have been giving her tramadol when necessary and that seems to help a lot with her pain the patient follows up with Dr. Fletcher Anon on January 18/18 11/01/16; patient missed her follow-up with Dr. Fletcher Anon last week due to a snow  day. Follow-up is now on February 15. The areas on the right lateral malleolus and dorsal right fifth toe remain closed over. The fifth toe was tentative is there is a surface eschar however I'm not going to disturb this. Therefore, her only open areas on the right lateral foot. This is a small punched out area. We have been using Prisma. I'm quite convinced this is an ischemic wound 11/15/16; patient has follow-up with Dr. Fletcher Anon on February 15. She has no open wound on the left leg and doesn't really complain of claudication that I can determine from talking to her. However on the right leg she clearly has some degree of claudication. The remaining open wound is on the right lateral foot. We have been using Iodosorb ointment 11/29/16; patient saw Dr. Fletcher Anon on 11/24/16. His comment is that her wound on her right lateral foot seems to be improving with local wound care. She has known significant right SFA disease. Her lower extremity Doppler will be repeated and according to the patient's daughter that appointment is on March 15. He is left with the thought that endovascular intervention in the right SFA might be necessary. The patient has quite a bit of pain especially at night related to the wound in her right foot. We changed her to Iodosorb ointment last week 12/13/16; this is a patient I follow every 2 weeks largely on a palliative approach at this point about ischemic wounds currently in the right foot. Initially she had them on her left lateral malleolus and left fifth toe. The area on the left lateral malleolus healed and the fifth toe has a surface eschar that I have elected not to remove. Both of these improved after revascularization by Dr. Fletcher Anon. We have been  using Iodosorb to the right lateral foot not much change here. 12/27/16; the patient had her arterial studies. This showed known bilateral SFA disease. Stable right ABI 0.5 to stable left ABI at 0.7 to the did not do it TBI on the right  it was 0.61 on the left she has a stent in the long segment of her left SFA from 05/18/16. All of her wounds are somewhat better. She states her pain is better in the right foot is improved. 01/10/17- patient is here for follow-up dilation of her right lateral foot ulcer. She has been tolerating Iodosorb. She is voices no complaints or concerns 01/24/17; small ischemic wound on right lateral foot. still non viable cover. follows with Dr. Fletcher Anon on 4/30. No open area on left foot 02/07/17 doing well and states she is pain free. Saw Dr. Fletcher Anon who said she is doing well and has "50% flow in the right leg and 70% in the left. According to her daughter he does not wish to do any further interventions 02/21/17; small ischemic wound on the right lateral foot. Per her daughter and the patient she has no open wound on the left foot and ankle which were her initial presenting wounds. still has claudication type pain at night she is been to see interventional cardiology who does not feel that she has a need for intervention on the right leg at present. She still has claudication type pain in the right leg which she manages at night with when necessary tramadol that I have prescribed SHAKEDA, PEARSE ([id number]) 03/14/17; small ischemic wound on the right lateral foot. They've been dressing this at home with Iodosorb ointment. The patient does not complain of any pain. The wound has a surface eschar over it and there is really no visible open area. This is similar to how the areas on the left leg healed Objective Constitutional Patient is hypertensive.. Pulse regular and within target range for patient.Marland Kitchen Respirations regular, non-labored and within target range.. Temperature is normal and within the target range for the patient.Marland Kitchen appears in no distress. Vitals Time Taken: 9:55 AM, Height: 64 in, Weight: 158 lbs, BMI: 27.1, Temperature: 97.8 F, Pulse: 80 bpm, Respiratory Rate: 16 breaths/min, Blood Pressure:  148/45 mmHg. General Notes: Wound exam; the right lateral foot does not have a visible opening. There appears to be surface eschar here however I did not go ahead and debride this. There is no evidence of surrounding infection. Integumentary (Hair, Skin) Wound #4 status is Open. Original cause of wound was Gradually Appeared. The wound is located on the Right,Lateral Foot. The wound measures 0.1cm length x 0.1cm width x 0.1cm depth; 0.008cm^2 area and 0.001cm^3 volume. The wound is limited to skin breakdown. There is no tunneling or undermining noted. There is a medium amount of serous drainage noted. The wound margin is distinct with the outline attached to the wound base. There is large (67-100%) pink granulation within the wound bed. There is a small (1-33%) amount of necrotic tissue within the wound bed including Adherent Slough. Periwound temperature was noted as No Abnormality. The periwound has tenderness on palpation. 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 Anesthetic: Wound #4 Right,Lateral Foot: Topical Lidocaine 4% cream applied to wound bed prior to debridement Skin Barriers/Peri-Wound Care: Wound #4 Right,Lateral Foot: Skin Prep Secondary Dressing: Wound #4 Right,Lateral Foot: Dry Gauze Boardered Foam Dressing Dressing Change Frequency: Wound #4 Right,Lateral Foot: Change dressing every other day. Follow-up Appointments: Wound #4 Right,Lateral Foot: Return Appointment in 1 month Edema Control: Wound #4  Right,Lateral Foot: Elevate legs to the level of the heart and pump ankles as often as possible Additional Orders / Instructions: Wound #4 Right,Lateral Foot: Increase protein intake. Medications-please add to medication list.: Wound #4 Right,Lateral Foot: Other: - Tramadol #1 I've advised continuing coverage of this area with a border foam or equivalent #2 if this reopens or there is visible open wound here I have advised going back to the Iodosorb ointment that they already have at home #3I'll see her back in a month and if this continues to be wound free I will discharge her at this point. She does not appear to have ongoing pain at this point which is gratifying Electronic Signature(s) MARLIA, SCHEWE ([id number]) Signed: 03/14/2017 5:27:39 PM By: [name] MD Entered By: [name] on 03/14/2017 10:32:10 [name] ([id number]) -------------------------------------------------------------------------------- SuperBill Details Patient Name: [name] Date of Service: 03/14/2017 Medical Record Patient Account Number: [id number] Number: Treating RN: Ahmed Prima 10/25/1918 (83 y.o. 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

## 2017-04-11 ENCOUNTER — Encounter: Payer: Medicare PPO | Attending: Internal Medicine | Admitting: Internal Medicine

## 2017-04-11 DIAGNOSIS — L97523 Non-pressure chronic ulcer of other part of left foot with necrosis of muscle: Secondary | ICD-10-CM | POA: Diagnosis not present

## 2017-04-11 DIAGNOSIS — L97512 Non-pressure chronic ulcer of other part of right foot with fat layer exposed: Secondary | ICD-10-CM | POA: Insufficient documentation

## 2017-04-11 DIAGNOSIS — E039 Hypothyroidism, unspecified: Secondary | ICD-10-CM | POA: Insufficient documentation

## 2017-04-11 DIAGNOSIS — I70243 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: Secondary | ICD-10-CM | POA: Insufficient documentation

## 2017-04-11 DIAGNOSIS — M1990 Unspecified osteoarthritis, unspecified site: Secondary | ICD-10-CM | POA: Insufficient documentation

## 2017-04-11 DIAGNOSIS — I70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: Secondary | ICD-10-CM | POA: Insufficient documentation

## 2017-04-14 ENCOUNTER — Encounter (INDEPENDENT_AMBULATORY_CARE_PROVIDER_SITE_OTHER): Payer: Medicare PPO | Admitting: Ophthalmology

## 2017-04-14 DIAGNOSIS — H35033 Hypertensive retinopathy, bilateral: Secondary | ICD-10-CM

## 2017-04-14 DIAGNOSIS — H353231 Exudative age-related macular degeneration, bilateral, with active choroidal neovascularization: Secondary | ICD-10-CM | POA: Diagnosis not present

## 2017-04-14 DIAGNOSIS — I10 Essential (primary) hypertension: Secondary | ICD-10-CM | POA: Diagnosis not present

## 2017-04-14 DIAGNOSIS — H43813 Vitreous degeneration, bilateral: Secondary | ICD-10-CM | POA: Diagnosis not present

## 2017-04-15 NOTE — Progress Notes (Signed)
HIILEI, GERST ([id number]) Visit Report for 04/11/2017 Chief Complaint Document Details Patient Name: [name], [name] Date of Service: 04/11/2017 9:45 AM Medical Record Patient Account Number: [id number] Number: Treating RN: Ahmed Prima Feb 26, 1919 (83 y.o. Other Clinician: Date of Birth/Sex: Female) Treating Pinkey Mcjunkin Primary Care Provider: Lorelee Market Provider/Extender: G Referring Provider: Vinson Moselle in Treatment: 52 Information Obtained from: Patient Chief Complaint here for follow-up on right lateral foot ulcer Electronic Signature(s) Signed: 04/11/2017 5:35:48 PM By: [name] MD Entered By: [name] on 04/11/2017 11:29:51 [name] ([id number]) -------------------------------------------------------------------------------- HPI Details Patient Name: [name] Date of Service: 04/11/2017 9:45 AM Medical Record Patient Account Number: [id number] Number: Treating RN: Ahmed Prima [birth year] (83 y.o. Other Clinician: Date of Birth/Sex: Female) Treating Laney Bagshaw Primary Care Provider: Lorelee Market Provider/Extender: G Referring Provider: Vinson Moselle in Treatment: 60 History of Present Illness HPI Description: 04/06/16; this is a 83 year old woman who arrives accompanied by 2 daughters for a wound on her left ankle and her left fifth toe. These have apparently been present for a year. I'm not quite certain how she came to this clinic however she was being followed by Sharlotte Alamo her podiatrist for these wounds. She was also referred to [facility] and they apparently did a test presumably arterial studies although we don't have any of these results and we couldn't get through to the office today. The family but has been applying a combination of Bactroban and a light bandage and perhaps more recently Silvadene cream. She did have an x-ray of the foot roughly 6  months ago at the podiatry office the family was unaware that if there were any abnormalities. Apparently they have not seen any healing here. Our intake nurse noted a slight skin tear on the right anterior lower leg. Nobody seemed aware of this. ABIs calculated in this clinic was 0.3 on the right and 0.4 on the left I have reviewed things [facility]. There is very little information on this patient. She apparently follows in current total clinic which we don't have information from. She has mentioned already been to a [facility]. She has a history of hypothyroidism, nephrolithiasis arthritis and has had a previous mastectomy. 04/13/16; patient's x-ray was normal. She is already been to see Dr. Delana Meyer vascular surgery. Her arterial exam was from November 2016 this showed a left ABI of 0.62 her right of 0.76. Her duplex ultrasound of the left leg showed biphasic waves in the common femoral and distal femoral artery however monophasic waves in the superficial femoral artery proximal and mid biphasic it distal. 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 The patient tells me that she has pain at night when she tries to lie down which is improved by getting up and sitting in the chair this sounds like claudication at rest. Her wounds are on the left medial malleolus and the dorsal fifth toe small punched out wounds that are right on bone. We use Santyl last week 04/20/16: nurse informed me pt has declined evaluation for significant PAD. she denies systemic s/s of infections. 04/27/16;; the patient has had noninvasive arterial studies done in November 2016. ABI and the left was 0.62. Monophasic waves at the superficial femoral artery. Occluded to the posterior tibial artery. So had greater than 50% stenosis of the right superficial femoral and greater than 50% stenosis of the left superficial femoral artery. She had bilateral tibial peroneal artery disease. Both of the wounds on the left fifth toe and  left lateral malleolus have been present for more than a year. They have been to see vein and vascular. The patient has some pain but miraculously I think the wounds have largely been stable. No evidence of infection 05/04/16; she goes for a noninvasive study tomorrow and then sees Dr. Fletcher Anon on Monday. By the time she is here next week we should have a better picture of whether something can be done with regards to her arterial flow. We continue to have ischemic-looking wounds on the left fifth toe and left lateral malleolus. 05/10/16; the patient went for her arterial studies and saw Dr. Fletcher Anon. As predicted she is felt to have critical limb ischemia. The feeling is that she has occlusion of the SFA. The feeling would she would be a Spiker, [name] ([id number]) candidate for a stent to the SFA. The patient did not make a decision to proceed with the procedure and she is here with family members to discuss this me today. He shouldn't has a lot of pain and cannot sleep and rest well at night per her family. 05/24/16; the patient went and had a complex revascularization/angioplasty of the left superficial femoral artery followed by drug-coated balloon angioplasty and spots stenting. She tolerated the procedure well. She was recommended for dual antiplatelet drugs with Plavix and aspirin for at least a month. She went yesterday for I believe follow-up serial Dopplers and ABIs although I don't see these results. The patient is unfortunately complaining of a lot of pain in her bilateral lower legs below the knees from the ankle to the knees. She apparently was prescribed lidocaine and apparently put this on her legs instead of over the wound areas. This may have something to do with it however there is a lot of edema in her bilateral legs I was able to find her arterial studies from yesterday. The left ABI has improved up to 0.66 post left SFA stent. The bilateral great toe indices remain abnormal with the  left being in the 0.25 range. Duplex ultrasound showed her left SFA stent is patent monophasic waveforms persist in the left leg 06/07/16; continued punched-out areas over the dorsal left fifth toe and left lateral malleolus. No major improvement 06/28/16; the areas over her dorsal left fifth toe and left lateral malleolus are covered in surface slough we are using Iodoflex 07/05/16. We have been using Iodoflex for 2-3 weeks now. I have not been debridement is because of pain 07/19/16 currently we have been using Iodoflex for roughly the past month. Previously we were unable to debride due to pain although today patient states that the pain is not nearly as severe as it has been in the past. In fact she rates this to be a 1 out of 10 and it worse to a to 10 with palpation of the wound. All and all her and her daughter feel like this is actually doing steadily better at this point in time. She is pleased with progress and we have been seeing her every 2 weeks. 08/02/16; this is a delightful 83 year old woman I have not seen in over a month. She has arterial insufficiency wounds remaining over the left lateral malleolus and the left fifth toe. With the help of Dr. Fletcher Anon we are able to get her revascularized on the left. The 2 wounds on the left have been making good progress and are definitely smaller especially the area over the left lateral malleolus. She is certainly in a lot less pain than she used to be  although I still think there is some claudication type pain. Unfortunately she is developed a area on the right lateral foot which is a small open area but I think is threatened. I suspect a small ischemic areas well. Also on her right fifth toe there is an area that is not open but looks as though it is receiving too much pressure from footwear. Finally an area over her right malleolus although I don't think this is on its way to anything ominous 08/17/16 patient presents today for follow up  evaluation she tells me that she is really doing fairly well from a pain standpoint compared to where she has been previous. She is tolerating the dressing changes without any complication. She continues to have discharge and drainage however. 08-30-16 Ms. Arenivas presents today with her daughter, she states that she continues to have intermittent shooting pains to the left lateral fifth toe at this site of seems to be a healed ulcer. She denies any other issues that her wound related since her last appointment. Her daughter states that she is in need of a tramadol refill at this time as she uses half a tablet at at bedtime to aid in sleep due to foot pain. Iodoflex has been used on all wounds in previous dressing changes. 09/13/16; the patient has ischemic wounds in her feet. The area over the left lateral malleolus is healed and the area over the left fifth toe looks improved.. She has an area on the lateral aspect of her right foot which is a small but deep wound. Finally she has a new open area on the medial aspect of the left medial malleolus. This is superficial 09/27/16; the area over the left lateral malleolus remains healed. The area over the left fifth toe has a surface and she states the pain is better but I don't think this is completely closed. She has an area on the lateral aspect of her right foot is a small but deep wound. The new open area on the medial aspect of the [name], [name] ([id number]) left ankle was closed from last time. 10/18/16; the area over her left lateral malleolus remains healed. The area over the left fifth toe has a surface over the top of this however there is no overt open area. Given the underlying issues of severe PAD and continued pain in the toe I would think it would be unlikely this is truly healed however I am not planning to debridement this area. The area on the right lateral foot which was a more recent wound is a small punched out painful area  again has significant surface slough and nonviable tissue. It is clear the patient still has claudication type pain however she remains functional. I have been giving her tramadol when necessary and that seems to help a lot with her pain the patient follows up with Dr. Fletcher Anon on January 18/18 11/01/16; patient missed her follow-up with Dr. Fletcher Anon last week due to a snow day. Follow-up is now on February 15. The areas on the right lateral malleolus and dorsal right fifth toe remain closed over. The fifth toe was tentative is there is a surface eschar however I'm not going to disturb this. Therefore, her only open areas on the right lateral foot. This is a small punched out area. We have been using Prisma. I'm quite convinced this is an ischemic wound 11/15/16; patient has follow-up with Dr. Fletcher Anon on February 15. She has no open wound on the left leg and  doesn't really complain of claudication that I can determine from talking to her. However on the right leg she clearly has some degree of claudication. The remaining open wound is on the right lateral foot. We have been using Iodosorb ointment 11/29/16; patient saw Dr. Fletcher Anon on 11/24/16. His comment is that her wound on her right lateral foot seems to be improving with local wound care. She has known significant right SFA disease. Her lower extremity Doppler will be repeated and according to the patient's daughter that appointment is on March 15. He is left with the thought that endovascular intervention in the right SFA might be necessary. The patient has quite a bit of pain especially at night related to the wound in her right foot. We changed her to Iodosorb ointment last week 12/13/16; this is a patient I follow every 2 weeks largely on a palliative approach at this point about ischemic wounds currently in the right foot. Initially she had them on her left lateral malleolus and left fifth toe. The area on the left lateral malleolus healed and the fifth  toe has a surface eschar that I have elected not to remove. Both of these improved after revascularization by Dr. Fletcher Anon. We have been using Iodosorb to the right lateral foot not much change here. 12/27/16; the patient had her arterial studies. This showed known bilateral SFA disease. Stable right ABI 0.5 to stable left ABI at 0.7 to the did not do it TBI on the right it was 0.61 on the left she has a stent in the long segment of her left SFA from 05/18/16. All of her wounds are somewhat better. She states her pain is better in the right foot is improved. 01/10/17- patient is here for follow-up dilation of her right lateral foot ulcer. She has been tolerating Iodosorb. She is voices no complaints or concerns 01/24/17; small ischemic wound on right lateral foot. still non viable cover. follows with Dr. Fletcher Anon on 4/30. No open area on left foot 02/07/17 doing well and states she is pain free. Saw Dr. Fletcher Anon who said she is doing well and has "50% flow in the right leg and 70% in the left. According to her daughter he does not wish to do any further interventions 02/21/17; small ischemic wound on the right lateral foot. Per her daughter and the patient she has no open wound on the left foot and ankle which were her initial presenting wounds. still has claudication type pain at night she is been to see interventional cardiology who does not feel that she has a need for intervention on the right leg at present. She still has claudication type pain in the right leg which she manages at night with when necessary tramadol that I have prescribed 03/14/17; small ischemic wound on the right lateral foot. They've been dressing this at home with Iodosorb ointment. The patient does not complain of any pain. The wound has a surface eschar over it and there is really no visible open area. This is similar to how the areas on the left leg healed 04/11/17; the small ischemic wound on her right lateral foot is finally closed  over. Small amount of eschar over the surface however I did not attempt to remove this. The patient claims to be asymptomatic she is not having any claudication that I'm able to elicit although her activity is limited [name], [name] ([id number]) Electronic Signature(s) Signed: 04/11/2017 5:35:48 PM By: [name] MD Entered By: [name] on 04/11/2017 11:31:10 [place],  Erna ([id number]) -------------------------------------------------------------------------------- Physical Exam Details Patient Name: ANGI, GOODELL Date of Service: 04/11/2017 9:45 AM Medical Record Patient Account Number: [id number] Number: Treating RN: Ahmed Prima [birth year] (83 y.o. Other Clinician: Date of Birth/Sex: Female) Treating Shlome Baldree Primary Care Provider: Lorelee Market Provider/Extender: G Referring Provider: Lorelee Market Weeks in Treatment: 36 Constitutional Patient is hypertensive.. Pulse regular and within target range for patient.Marland Kitchen Respirations regular, non-labored and within target range.. Temperature is normal and within the target range for the patient.Marland Kitchen appears in no distress. Cardiovascular Pedal pulses absent bilaterally.. Notes Wound exam; the area on the right lateral foot has a small divot but no visible opening. This is a significant improvement from 3 weeks ago. There is a very tiny amount of surface eschar but I am not going to debride this. There is no evidence of surrounding infection Electronic Signature(s) Signed: 04/11/2017 5:35:48 PM By: [name] MD Entered By: [name] on 04/11/2017 11:32:13 [name] ([id number]) -------------------------------------------------------------------------------- Physician Orders Details Patient Name: [name] Date of Service: 04/11/2017 9:45 AM Medical Record Patient Account Number: [id number] Number: Treating RN: Ahmed Prima 1919/08/12 (83 y.o. Other  Clinician: Date of Birth/Sex: Female) Treating Taj Arteaga Primary Care Provider: Lorelee Market Provider/Extender: G Referring Provider: Vinson Moselle in Treatment: 50 Verbal / Phone Orders: Yes Clinician: Carolyne Fiscal, Debi Read Back and Verified: Yes Diagnosis Coding Discharge From [name] o Discharge from [facility] - Please call our office if you have any questions or concerns. Electronic Signature(s) Signed: 04/11/2017 5:35:48 PM By: [name] MD Signed: 04/14/2017 4:17:34 PM By: Alric Quan Entered By: Alric Quan on 04/11/2017 10:23:02 [name] ([id number]) -------------------------------------------------------------------------------- Problem List Details Patient Name: [name] Date of Service: 04/11/2017 9:45 AM Medical Record Patient Account Number: [id number] Number: Treating RN: Ahmed Prima 12-01-1918 (83 y.o. 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 Other Clinician: Date of Birth/Sex: Female) Treating Hermon Zea, [place] Primary Care Provider: Lorelee Market Provider/Extender: G Referring Provider: Vinson Moselle in Treatment: 52 Subjective Chief Complaint Information obtained from Patient here for follow-up on right lateral foot ulcer History of Present Illness (HPI) 04/06/16; this is a 83 year old woman who arrives accompanied by 2 daughters for a wound on her left ankle and her left fifth toe. These have apparently been present for a year. I'm not quite certain how she came to this clinic however she was being followed by Sharlotte Alamo her podiatrist for these wounds. She was also referred to [facility] and they apparently did a test presumably arterial studies although we don't have any of these results and we couldn't get through to the office today. The family but has  been applying a combination of Bactroban and a light bandage and perhaps more recently Silvadene cream. She did have an x-ray of the foot roughly 6 months ago at the podiatry office the family was unaware that if there were any abnormalities. Apparently they have not seen any healing here. Our intake nurse noted a slight skin tear on the right anterior lower leg. Nobody seemed aware of this. ABIs calculated in this clinic was 0.3 on the right and 0.4 on the left I have reviewed things [facility]. There is very little information on this patient. She apparently follows in current total clinic which we don't have information from. She has mentioned already been to a [facility]. She has a history of hypothyroidism, nephrolithiasis arthritis and has had a previous mastectomy. 04/13/16; patient's x-ray was normal. She is already been to see Dr. Delana Meyer vascular surgery. Her arterial exam  was from November 2016 this showed a left ABI of 0.62 her right of 0.76. Her duplex ultrasound of the left leg showed biphasic waves in the common femoral and distal femoral artery however monophasic waves in the superficial femoral artery proximal and mid biphasic it distal. 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 The patient tells me that she has pain at night when she tries to lie down which is improved by getting up and sitting in the chair this sounds like claudication at rest. Her wounds are on the left medial malleolus and the dorsal fifth toe small punched out wounds that are right on bone. We use Santyl last week 04/20/16: nurse informed me pt has declined evaluation for significant PAD. she denies systemic s/s of infections. 04/27/16;; the patient has had noninvasive arterial studies done in November 2016. ABI and the left was 0.62. Monophasic waves at the superficial femoral artery. Occluded to the posterior tibial artery. So had greater than 50% stenosis of the right superficial femoral and greater than 50% stenosis of the left superficial femoral artery. She had bilateral tibial peroneal artery disease. Both of the wounds on the left fifth toe and left lateral malleolus have been present for more than a year. They have been to see vein and vascular. The patient has some pain but miraculously I think the wounds have largely been stable. No Kyllo, Preesha ([id number]) evidence of infection 05/04/16; she goes for a noninvasive study tomorrow and then sees Dr. Fletcher Anon on Monday. By the time she is here next week we should have a better picture of whether something can be done with regards to her arterial flow. We continue to have ischemic-looking wounds on the left fifth toe and left lateral malleolus. 05/10/16; the patient went for her arterial studies and saw Dr. Fletcher Anon. As predicted she is felt to have critical limb ischemia. The feeling is that she has occlusion of the SFA. The feeling  would she would be a candidate for a stent to the SFA. The patient did not make a decision to proceed with the procedure and she is here with family members to discuss this me today. He shouldn't has a lot of pain and cannot sleep and rest well at night per her family. 05/24/16; the patient went and had a complex revascularization/angioplasty of the left superficial femoral artery followed by drug-coated balloon angioplasty and spots stenting. She tolerated the procedure well. She was recommended for dual antiplatelet drugs with Plavix and aspirin for at least a month. She went yesterday for I believe follow-up serial Dopplers and ABIs although I don't see these results.  The patient is unfortunately complaining of a lot of pain in her bilateral lower legs below the knees from the ankle to the knees. She apparently was prescribed lidocaine and apparently put this on her legs instead of over the wound areas. This may have something to do with it however there is a lot of edema in her bilateral legs I was able to find her arterial studies from yesterday. The left ABI has improved up to 0.66 post left SFA stent. The bilateral great toe indices remain abnormal with the left being in the 0.25 range. Duplex ultrasound showed her left SFA stent is patent monophasic waveforms persist in the left leg 06/07/16; continued punched-out areas over the dorsal left fifth toe and left lateral malleolus. No major improvement 06/28/16; the areas over her dorsal left fifth toe and left lateral malleolus are covered in surface slough we are using Iodoflex 07/05/16. We have been using Iodoflex for 2-3 weeks now. I have not been debridement is because of pain 07/19/16 currently we have been using Iodoflex for roughly the past month. Previously we were unable to debride due to pain although today patient states that the pain is not nearly as severe as it has been in the past. In fact she rates this to be a 1 out of 10 and  it worse to a to 10 with palpation of the wound. All and all her and her daughter feel like this is actually doing steadily better at this point in time. She is pleased with progress and we have been seeing her every 2 weeks. 08/02/16; this is a delightful 83 year old woman I have not seen in over a month. She has arterial insufficiency wounds remaining over the left lateral malleolus and the left fifth toe. With the help of Dr. Fletcher Anon we are able to get her revascularized on the left. The 2 wounds on the left have been making good progress and are definitely smaller especially the area over the left lateral malleolus. She is certainly in a lot less pain than she used to be although I still think there is some claudication type pain. Unfortunately she is developed a area on the right lateral foot which is a small open area but I think is threatened. I suspect a small ischemic areas well. Also on her right fifth toe there is an area that is not open but looks as though it is receiving too much pressure from footwear. Finally an area over her right malleolus although I don't think this is on its way to anything ominous 08/17/16 patient presents today for follow up evaluation she tells me that she is really doing fairly well from a pain standpoint compared to where she has been previous. She is tolerating the dressing changes without any complication. She continues to have discharge and drainage however. 08-30-16 Ms. Marcoux presents today with her daughter, she states that she continues to have intermittent shooting pains to the left lateral fifth toe at this site of seems to be a healed ulcer. She denies any other issues that her wound related since her last appointment. Her daughter states that she is in need of a tramadol refill at this time as she uses half a tablet at at bedtime to aid in sleep due to foot pain. Iodoflex has been used on all wounds in previous dressing changes. 09/13/16; the  patient has ischemic wounds in her feet. The area over the left lateral malleolus is healed and [name], [name] ([id number]) the area over  the left fifth toe looks improved.. She has an area on the lateral aspect of her right foot which is a small but deep wound. Finally she has a new open area on the medial aspect of the left medial malleolus. This is superficial 09/27/16; the area over the left lateral malleolus remains healed. The area over the left fifth toe has a surface and she states the pain is better but I don't think this is completely closed. She has an area on the lateral aspect of her right foot is a small but deep wound. The new open area on the medial aspect of the left ankle was closed from last time. 10/18/16; the area over her left lateral malleolus remains healed. The area over the left fifth toe has a surface over the top of this however there is no overt open area. Given the underlying issues of severe PAD and continued pain in the toe I would think it would be unlikely this is truly healed however I am not planning to debridement this area. The area on the right lateral foot which was a more recent wound is a small punched out painful area again has significant surface slough and nonviable tissue. It is clear the patient still has claudication type pain however she remains functional. I have been giving her tramadol when necessary and that seems to help a lot with her pain the patient follows up with Dr. Fletcher Anon on January 18/18 11/01/16; patient missed her follow-up with Dr. Fletcher Anon last week due to a snow day. Follow-up is now on February 15. The areas on the right lateral malleolus and dorsal right fifth toe remain closed over. The fifth toe was tentative is there is a surface eschar however I'm not going to disturb this. Therefore, her only open areas on the right lateral foot. This is a small punched out area. We have been using Prisma. I'm quite convinced this is an ischemic  wound 11/15/16; patient has follow-up with Dr. Fletcher Anon on February 15. She has no open wound on the left leg and doesn't really complain of claudication that I can determine from talking to her. However on the right leg she clearly has some degree of claudication. The remaining open wound is on the right lateral foot. We have been using Iodosorb ointment 11/29/16; patient saw Dr. Fletcher Anon on 11/24/16. His comment is that her wound on her right lateral foot seems to be improving with local wound care. She has known significant right SFA disease. Her lower extremity Doppler will be repeated and according to the patient's daughter that appointment is on March 15. He is left with the thought that endovascular intervention in the right SFA might be necessary. The patient has quite a bit of pain especially at night related to the wound in her right foot. We changed her to Iodosorb ointment last week 12/13/16; this is a patient I follow every 2 weeks largely on a palliative approach at this point about ischemic wounds currently in the right foot. Initially she had them on her left lateral malleolus and left fifth toe. The area on the left lateral malleolus healed and the fifth toe has a surface eschar that I have elected not to remove. Both of these improved after revascularization by Dr. Fletcher Anon. We have been using Iodosorb to the right lateral foot not much change here. 12/27/16; the patient had her arterial studies. This showed known bilateral SFA disease. Stable right ABI 0.5 to stable left ABI at 0.7 to the  did not do it TBI on the right it was 0.61 on the left she has a stent in the long segment of her left SFA from 05/18/16. All of her wounds are somewhat better. She states her pain is better in the right foot is improved. 01/10/17- patient is here for follow-up dilation of her right lateral foot ulcer. She has been tolerating Iodosorb. She is voices no complaints or concerns 01/24/17; small ischemic wound on  right lateral foot. still non viable cover. follows with Dr. Fletcher Anon on 4/30. No open area on left foot 02/07/17 doing well and states she is pain free. Saw Dr. Fletcher Anon who said she is doing well and has "50% flow in the right leg and 70% in the left. According to her daughter he does not wish to do any further interventions 02/21/17; small ischemic wound on the right lateral foot. Per her daughter and the patient she has no open wound on the left foot and ankle which were her initial presenting wounds. still has claudication type pain at night she is been to see interventional cardiology who does not feel that she has a need for intervention on the right leg at present. She still has claudication type pain in the right leg which she manages at night with when necessary tramadol that I have prescribed [name], [name] ([id number]) 03/14/17; small ischemic wound on the right lateral foot. They've been dressing this at home with Iodosorb ointment. The patient does not complain of any pain. The wound has a surface eschar over it and there is really no visible open area. This is similar to how the areas on the left leg healed 04/11/17; the small ischemic wound on her right lateral foot is finally closed over. Small amount of eschar over the surface however I did not attempt to remove this. The patient claims to be asymptomatic she is not having any claudication that I'm able to elicit although her activity is limited Objective Constitutional Patient is hypertensive.. Pulse regular and within target range for patient.Marland Kitchen Respirations regular, non-labored and within target range.. Temperature is normal and within the target range for the patient.Marland Kitchen appears in no distress. Vitals Time Taken: 9:50 AM, Height: 64 in, Weight: 158 lbs, BMI: 27.1, Temperature: 98.3 F, Pulse: 73 bpm, Respiratory Rate: 16 breaths/min, Blood Pressure: 147/49 mmHg. Cardiovascular Pedal pulses absent bilaterally.. General Notes: Wound  exam; the area on the right lateral foot has a small divot but no visible opening. This is a significant improvement from 3 weeks ago. There is a very tiny amount of surface eschar but I am not going to debride this. There is no evidence of surrounding infection Integumentary (Hair, Skin) Wound #4 status is Open. Original cause of wound was Gradually Appeared. The wound is located on the Right,Lateral Foot. The wound measures 0cm length x 0cm width x 0cm depth; 0cm^2 area and 0cm^3 volume. The wound is limited to skin breakdown. There is no tunneling or undermining noted. There is a none present amount of drainage noted. The wound margin is distinct with the outline attached to the wound base. There is no granulation within the wound bed. There is no necrotic tissue within the wound bed. Periwound temperature was noted as No Abnormality. The periwound has tenderness on palpation. 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 #1 I think the patient can be discharged from the clinic. Had arterial wounds on the left and then the right lateral foot most recently #2 she has known PAD and is followed by Dr. Fletcher Anon as well. #3 I am gratified that this has closed over on both sides Electronic Signature(s) Signed: 04/11/2017 5:35:48 PM By: [name] MD Entered By: [name] on 04/11/2017 11:33:36 [name]  ([id number]) -------------------------------------------------------------------------------- [place] Details Patient Name: [name] Date of Service: 04/11/2017 Medical Record Patient Account Number: [id number] Number: Treating RN: Ahmed Prima Jul 25, 1919 (83 y.o. 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

## 2017-04-15 NOTE — Progress Notes (Signed)
[name], [name] ([id number]) Visit Report for 04/11/2017 Arrival Information Details Patient Name: [name], [name] Date of Service: 04/11/2017 9:45 AM Medical Record Patient Account Number: [id number] Number: Treating RN: Ahmed Prima 1918-12-02 (83 y.o. Other Clinician: Date of Birth/Sex: Female) Treating ROBSON, [place] [facility] Caran Storck: [place]orelee [place] Eufemio Strahm/Extender: G Referring Trevante Tennell: Vinson Moselle in Treatment: 52 Visit Information History Since Last Visit All ordered tests and consults were completed: No Patient Arrived: Gilford Rile Added or deleted any medications: No Arrival Time: 09:48 Any new allergies or adverse reactions: No Accompanied By: daughter Had a fall or experienced change in No Transfer Assistance: EasyPivot activities of daily living that may affect Patient Lift risk of falls: Patient Identification Verified: Yes Signs or symptoms of abuse/neglect since last No Secondary Verification Process Yes visito Completed: Hospitalized since last visit: No Patient Requires Transmission- No Has Dressing in Place as Prescribed: Yes Based Precautions: Pain Present Now: No Patient Has Alerts: Yes Electronic Signature(s) Signed: 04/14/2017 4:17:34 PM By: Alric Quan Entered By: Alric Quan on 04/11/2017 09:50:06 [name] ([id number]) -------------------------------------------------------------------------------- Clinic Level of Care Assessment Details Patient Name: [name] Date of Service: 04/11/2017 9:45 AM Medical Record Patient Account Number: [id number] Number: Treating RN: Ahmed Prima [birth year] (83 y.o. Other Clinician: Date of Birth/Sex: Female) Treating ROBSON, [place] Primary Care Mileah Hemmer: Lorelee Market Shatoya Roets/Extender: G Referring Marymargaret Kirker: Vinson Moselle in Treatment: 52 Clinic Level of Care Assessment Items TOOL 4 Quantity Score X - Use when only an EandM  is performed on FOLLOW-UP visit 1 0 ASSESSMENTS - Nursing Assessment / Reassessment X - Reassessment of Co-morbidities (includes updates in patient status) 1 10 X - Reassessment of Adherence to Treatment Plan 1 5 ASSESSMENTS - Wound and Skin Assessment / Reassessment X - Simple Wound Assessment / Reassessment - one wound 1 5 []  - Complex Wound Assessment / Reassessment - multiple wounds 0 []  - Dermatologic / Skin Assessment (not related to wound area) 0 ASSESSMENTS - Focused Assessment []  - Circumferential Edema Measurements - multi extremities 0 []  - Nutritional Assessment / Counseling / Intervention 0 []  - Lower Extremity Assessment (monofilament, tuning fork, pulses) 0 []  - Peripheral Arterial Disease Assessment (using hand held doppler) 0 ASSESSMENTS - Ostomy and/or Continence Assessment and Care []  - Incontinence Assessment and Management 0 []  - Ostomy Care Assessment and Management (repouching, etc.) 0 PROCESS - Coordination of Care X - Simple Patient / Family Education for ongoing care 1 15 []  - Complex (extensive) Patient / Family Education for ongoing care 0 []  - Staff obtains Programmer, systems, Records, Test Results / Process Orders 0 []  - Staff telephones HHA, Nursing Homes / Clarify orders / etc 0 [name], [name] ([id number]) []  - Routine Transfer to another Facility (non-emergent condition) 0 []  - Routine Hospital Admission (non-emergent condition) 0 []  - New Admissions / Biomedical engineer / Ordering NPWT, Apligraf, etc. 0 []  - Emergency Hospital Admission (emergent condition) 0 X - Simple Discharge Coordination 1 10 []  - Complex (extensive) Discharge Coordination 0 PROCESS - Special Needs []  - Pediatric / Minor Patient Management 0 []  - Isolation Patient Management 0 []  - Hearing / Language / Visual special needs 0 []  - Assessment of Community assistance (transportation, D/C planning, etc.) 0 []  - Additional assistance / Altered mentation 0 []  - Support Surface(s)  Assessment (bed, cushion, seat, etc.) 0 INTERVENTIONS - Wound Cleansing / Measurement X - Simple Wound Cleansing - one wound 1 5 []  - Complex Wound Cleansing - multiple wounds 0 X - Wound Imaging (photographs -  any number of wounds) 1 5 []  - Wound Tracing (instead of photographs) 0 []  - Simple Wound Measurement - one wound 0 []  - Complex Wound Measurement - multiple wounds 0 INTERVENTIONS - Wound Dressings []  - Small Wound Dressing one or multiple wounds 0 []  - Medium Wound Dressing one or multiple wounds 0 []  - Large Wound Dressing one or multiple wounds 0 []  - Application of Medications - topical 0 []  - Application of Medications - injection 0 [name], [name] ([id number]) INTERVENTIONS - Miscellaneous []  - External ear exam 0 []  - Specimen Collection (cultures, biopsies, blood, body fluids, etc.) 0 []  - Specimen(s) / Culture(s) sent or taken to Lab for analysis 0 []  - Patient Transfer (multiple staff / Harrel Lemon Lift / Similar devices) 0 []  - Simple Staple / Suture removal (25 or less) 0 []  - Complex Staple / Suture removal (26 or more) 0 []  - Hypo / Hyperglycemic Management (close monitor of Blood Glucose) 0 []  - Ankle / Brachial Index (ABI) - do not check if billed separately 0 X - Vital Signs 1 5 Has the patient been seen at the hospital within the last three years: Yes Total Score: 60 Level Of Care: New/Established - Level 2 Electronic Signature(s) Signed: 04/14/2017 4:17:34 PM By: Alric Quan Entered By: Alric Quan on 04/11/2017 11:42:46 [name] ([id number]) -------------------------------------------------------------------------------- Encounter Discharge Information Details Patient Name: [name] Date of Service: 04/11/2017 9:45 AM Medical Record Patient Account Number: [id number] Number: Treating RN: Ahmed Prima 1919/06/16 (83 y.o. Other Clinician: Date of Birth/Sex: Female) Treating ROBSON, [place] Primary Care Kalianne Fetting: Lorelee Market Armour Villanueva/Extender: G Referring Jolean Madariaga: Vinson Moselle in Treatment: 28 Encounter Discharge Information Items Discharge Pain Level: 0 Discharge Condition: Stable Ambulatory Status: Walker Discharge Destination: Home Transportation: Private Auto Accompanied By: daughter Schedule Follow-up Appointment: No Medication Reconciliation completed No and provided to Patient/Care Yaniah Thiemann: Provided on Clinical Summary of Care: 04/11/2017 Form Type Recipient Paper Patient [facility] Electronic Signature(s) Signed: 04/14/2017 4:17:34 PM By: Alric Quan Previous Signature: 04/11/2017 10:23:32 AM Version By: Ruthine Dose Entered By: Alric Quan on 04/11/2017 10:24:04 [name] ([id number]) -------------------------------------------------------------------------------- Lower Extremity Assessment Details Patient Name: [name] Date of Service: 04/11/2017 9:45 AM Medical Record Patient Account Number: [id number] Number: Treating RN: Ahmed Prima 08-27-1919 (83 y.o. Other Clinician: Date of Birth/Sex: Female) Treating ROBSON, MICHAEL Primary Care Vaniah Chambers: Lorelee Market Mahamadou Weltz/Extender: G Referring Breydan Shillingburg: Vinson Moselle in Treatment: 52 Vascular Assessment Pulses: Dorsalis Pedis Palpable: [Right:Yes] Posterior Tibial Extremity colors, hair growth, and conditions: Extremity Color: [Right:Mottled] Temperature of Extremity: [Right:Warm] Capillary Refill: [Right:< 3 seconds] Toe Nail Assessment Left: Right: Thick: Yes Discolored: Yes Deformed: No Improper Length and Hygiene: No Electronic Signature(s) Signed: 04/14/2017 4:17:34 PM By: Alric Quan Entered By: Alric Quan on 04/11/2017 10:18:55 [name] ([id number]) -------------------------------------------------------------------------------- Multi Wound Chart Details Patient Name: [name] Date of Service: 04/11/2017 9:45 AM Medical Record  Patient Account Number: [id number] Number: Treating RN: Ahmed Prima [birth year] (83 y.o. Other Clinician: Date of Birth/Sex: Female) Treating ROBSON, MICHAEL Primary Care Jovannie Ulibarri: [place]orelee [place] Letti Towell/Extender: G Referring Shaquel Josephson: Lorelee Market Weeks in Treatment: 52 Vital Signs Height(in): 64 Pulse(bpm): 73 Weight(lbs): 158 Blood Pressure 147/49 (mmHg): Body Mass Index(BMI): 27 Temperature(F): 98.3 Respiratory Rate 16 (breaths/min): Photos: [N/A:N/A] Wound Location: Right Foot - Lateral N/A N/A Wounding Event: Gradually Appeared N/A N/A Primary Etiology: Arterial Insufficiency Ulcer N/A N/A Comorbid History: Cataracts, Hypertension, N/A N/A Osteoarthritis Date Acquired: 07/26/2016 N/A N/A Weeks of Treatment: 36 N/A N/A Wound Status: Open N/A N/A  Measurements L x W x D 0x0x0 N/A N/A (cm) Area (cm) : 0 N/A N/A Volume (cm) : 0 N/A N/A % Reduction in Area: 100.00% N/A N/A % Reduction in Volume: 100.00% N/A N/A Classification: Partial Thickness N/A N/A Exudate Amount: None Present N/A N/A Wound Margin: Distinct, outline attached N/A N/A Granulation Amount: None Present (0%) N/A N/A Necrotic Amount: None Present (0%) N/A N/A Exposed Structures: Fascia: No N/A N/A Fat Layer (Subcutaneous [name], [name] ([id number]) Tissue) Exposed: No Tendon: No Muscle: No Joint: No Bone: No Limited to Skin Breakdown Epithelialization: Large (67-100%) N/A N/A Periwound Skin Texture: No Abnormalities Noted N/A N/A Periwound Skin No Abnormalities Noted N/A N/A Moisture: Periwound Skin Color: No Abnormalities Noted N/A N/A Temperature: No Abnormality N/A N/A Tenderness on Yes N/A N/A Palpation: Wound Preparation: Ulcer Cleansing: N/A N/A Rinsed/Irrigated with Saline Topical Anesthetic Applied: None Treatment Notes Electronic Signature(s) Signed: 04/11/2017 5:35:48 PM By: [name] MD Entered By: [name] on 04/11/2017  11:29:35 [name] ([id number]) -------------------------------------------------------------------------------- Multi-Disciplinary Care Plan Details Patient Name: [name] Date of Service: 04/11/2017 9:45 AM Medical Record Patient Account Number: [id number] Number: Treating RN: Ahmed Prima 04-20-1919 (83 y.o. Other Clinician: Date of Birth/Sex: Female) Treating ROBSON, MICHAEL Primary Care Damiel Barthold: Lorelee Market Starr Urias/Extender: G Referring Pierson Vantol: Lorelee Market Weeks in Treatment: 46 Active Inactive Electronic Signature(s) Signed: 04/14/2017 4:17:34 PM By: Alric Quan Entered By: Alric Quan on 04/11/2017 10:20:34 [name] ([id number]) -------------------------------------------------------------------------------- Pain Assessment Details Patient Name: [name] Date of Service: 04/11/2017 9:45 AM Medical Record Patient Account Number: [id number] Number: Treating RN: Ahmed Prima 1919-05-14 (83 y.o. Other Clinician: Date of Birth/Sex: Female) Treating ROBSON, MICHAEL Primary Care Diangelo Radel: [place]orelee Market Marianny Goris/Extender: G Referring Kacin Dancy: Vinson Moselle in Treatment: 52 Active Problems Location of Pain Severity and Description of Pain Patient Has Paino No Site Locations With Dressing Change: No Pain Management and Medication Current Pain Management: Electronic Signature(s) Signed: 04/14/2017 4:17:34 PM By: Alric Quan Entered By: Alric Quan on 04/11/2017 09:50:14 [name] ([id number]) -------------------------------------------------------------------------------- Patient/Caregiver Education Details Patient Name: [name] Date of Service: 04/11/2017 9:45 AM Medical Record Patient Account Number: [id number] Number: Treating RN: Ahmed Prima 1919-03-30 (83 y.o. Other Clinician: Date of Birth/Gender: Female) Treating ROBSON,  [place] Primary Care Physician: Lorelee Market Physician/Extender: G Referring Physician: Vinson Moselle in Treatment: 57 Education Assessment Education Provided To: Patient and Caregiver daughter Education Topics Provided Wound/Skin Impairment: Handouts: Other: Please call our office if you have any questions or concerns. Methods: Explain/Verbal Responses: State content correctly Electronic Signature(s) Signed: 04/14/2017 4:17:34 PM By: Alric Quan Entered By: Alric Quan on 04/11/2017 10:24:22 [name] ([id number]) -------------------------------------------------------------------------------- Wound Assessment Details Patient Name: [name] Date of Service: 04/11/2017 9:45 AM Medical Record Patient Account Number: [id number] Number: Treating RN: Ahmed Prima Aug 29, 1919 (83 y.o. Other Clinician: Date of Birth/Sex: Female) Treating ROBSON, MICHAEL Primary Care Kaynen Minner: [place]lee Market Laya Letendre/Extender: G Referring Kathy Wares: Lorelee Market Weeks in Treatment: 52 Wound Status Wound Number: 4 Primary Arterial Insufficiency Ulcer Etiology: Wound Location: Right Foot - Lateral Wound Status: Open Wounding Event: Gradually Appeared Comorbid Cataracts, Hypertension, Date Acquired: 07/26/2016 History: Osteoarthritis Weeks Of Treatment: 36 Clustered Wound: No Photos Photo Uploaded By: Alric Quan on 04/11/2017 10:52:17 Wound Measurements Length: (cm) 0 % Reductio Width: (cm) 0 % Reductio Depth: (cm) 0 Epithelial Area: (cm) 0 Tunneling Volume: (cm) 0 Undermini n in Area: 100% n in Volume: 100% ization: Large (67-100%) : No ng: No Wound Description Classification: Partial Thickness Wound Margin: Distinct, outline attached  Exudate Amount: None Present Foul Odor After Cleansing: No Slough/Fibrino No Wound Bed Granulation Amount: None Present (0%) Exposed Structure Necrotic Amount: None Present  (0%) Fascia Exposed: No Fat Layer (Subcutaneous Tissue) Exposed: No Tendon Exposed: No Muscle Exposed: No [name], [name] ([id number]) Joint Exposed: No Bone Exposed: No Limited to Skin Breakdown Periwound Skin Texture Texture Color No Abnormalities Noted: No No Abnormalities Noted: No Moisture Temperature / Pain No Abnormalities Noted: No Temperature: No Abnormality Tenderness on Palpation: Yes Wound Preparation Ulcer Cleansing: Rinsed/Irrigated with Saline Topical Anesthetic Applied: None Electronic Signature(s) Signed: 04/14/2017 4:17:34 PM By: Alric Quan Entered By: Alric Quan on 04/11/2017 10:19:45 [name] ([id number]) -------------------------------------------------------------------------------- Vitals Details Patient Name: [name] Date of Service: 04/11/2017 9:45 AM Medical Record Patient Account Number: [id number] Number: Treating RN: Ahmed Prima [birth year] (83 y.o. Other Clinician: Date of Birth/Sex: Female) Treating ROBSON, MICHAEL Primary Care Nidal Rivet: [place]lee [place] Jalyiah Shelley/Extender: G Referring Sheryl Saintil: Lorelee Market Weeks in Treatment: 52 Vital Signs Time Taken: 09:50 Temperature (F): 98.3 Height (in): 64 Pulse (bpm): 73 Weight (lbs): 158 Respiratory Rate (breaths/min): 16 Body Mass Index (BMI): 27.1 Blood Pressure (mmHg): 147/49 Reference Range: 80 - 120 mg / dl Electronic Signature(s) Signed: 04/14/2017 4:17:34 PM By: Alric Quan Entered By: Alric Quan on 04/11/2017 09:54:39

## 2017-05-29 ENCOUNTER — Telehealth: Payer: Self-pay | Admitting: Cardiovascular Disease

## 2017-05-29 NOTE — Telephone Encounter (Signed)
Pt daughter called back, states Dr. Fletcher Anon told her in April, if pt foot was better, she did not need to come see him. Please advise if pt needs 3 month fu.

## 2017-05-29 NOTE — Telephone Encounter (Signed)
Called to scheduled fu LEA / ABI 1 year fu in September .   Per Daughter patients feet have cleared up. Does she still need to do this u/s .

## 2017-05-29 NOTE — Telephone Encounter (Signed)
Per April OV notes: "Continue follow-up at the wound center in follow-up with me in 3 months." Pt is past due for July f/u OV with Dr. Fletcher Anon. Left detailed message on [name] Smith's VM (ok per DPR) of MD recommendations.  Provided call back number to schedule f/u if pt and family are agreeable.

## 2017-05-31 NOTE — Telephone Encounter (Signed)
Left message on daughter Nat Math VM to call the office for 3 month f/u appt for pt.

## 2017-06-02 NOTE — Telephone Encounter (Signed)
Pt daughter called back stating they would like to fu back in April 2019  Placed in new recall.

## 2017-06-15 ENCOUNTER — Telehealth: Payer: Self-pay | Admitting: Cardiovascular Disease

## 2017-06-15 NOTE — Telephone Encounter (Signed)
Spoke to patient daughter to schedule LE ART  She stated she would wait and call back  Patient daughter is in hospital has a lot going on

## 2017-06-16 ENCOUNTER — Encounter (INDEPENDENT_AMBULATORY_CARE_PROVIDER_SITE_OTHER): Payer: Medicare PPO | Admitting: Ophthalmology

## 2017-06-16 DIAGNOSIS — H35033 Hypertensive retinopathy, bilateral: Secondary | ICD-10-CM

## 2017-06-16 DIAGNOSIS — H353231 Exudative age-related macular degeneration, bilateral, with active choroidal neovascularization: Secondary | ICD-10-CM

## 2017-06-16 DIAGNOSIS — H43813 Vitreous degeneration, bilateral: Secondary | ICD-10-CM

## 2017-06-16 DIAGNOSIS — I10 Essential (primary) hypertension: Secondary | ICD-10-CM | POA: Diagnosis not present

## 2017-08-11 ENCOUNTER — Encounter (INDEPENDENT_AMBULATORY_CARE_PROVIDER_SITE_OTHER): Payer: Medicare PPO | Admitting: Ophthalmology

## 2017-08-11 DIAGNOSIS — I10 Essential (primary) hypertension: Secondary | ICD-10-CM | POA: Diagnosis not present

## 2017-08-11 DIAGNOSIS — H353231 Exudative age-related macular degeneration, bilateral, with active choroidal neovascularization: Secondary | ICD-10-CM | POA: Diagnosis not present

## 2017-08-11 DIAGNOSIS — H35033 Hypertensive retinopathy, bilateral: Secondary | ICD-10-CM | POA: Diagnosis not present

## 2017-08-11 DIAGNOSIS — H43813 Vitreous degeneration, bilateral: Secondary | ICD-10-CM | POA: Diagnosis not present

## 2017-09-29 ENCOUNTER — Encounter (INDEPENDENT_AMBULATORY_CARE_PROVIDER_SITE_OTHER): Payer: Medicare PPO | Admitting: Ophthalmology

## 2017-09-29 DIAGNOSIS — I10 Essential (primary) hypertension: Secondary | ICD-10-CM

## 2017-09-29 DIAGNOSIS — H43813 Vitreous degeneration, bilateral: Secondary | ICD-10-CM

## 2017-09-29 DIAGNOSIS — H353231 Exudative age-related macular degeneration, bilateral, with active choroidal neovascularization: Secondary | ICD-10-CM | POA: Diagnosis not present

## 2017-09-29 DIAGNOSIS — H35033 Hypertensive retinopathy, bilateral: Secondary | ICD-10-CM | POA: Diagnosis not present

## 2017-10-12 NOTE — Telephone Encounter (Signed)
In researching patient's chart, patient had the LEA/ABI in march 2018 and followed up with Dr Fletcher Anon in April. At April appointment with Dr Fletcher Anon, he said to f/u with him in 3 months. Patient has not been seen by Dr Fletcher Anon since April 2018. There is no new order for a doppler study at this time, patient does need an appointment with Dr. Fletcher Anon at next available.

## 2017-10-12 NOTE — Telephone Encounter (Signed)
Patient daughter calling back to schedule the LE ART that was needed Scheduled appt for 1/16 with no order Please add order

## 2017-10-13 ENCOUNTER — Other Ambulatory Visit: Payer: Self-pay | Admitting: Family Medicine

## 2017-10-13 DIAGNOSIS — I739 Peripheral vascular disease, unspecified: Secondary | ICD-10-CM

## 2017-10-18 ENCOUNTER — Other Ambulatory Visit: Payer: Self-pay | Admitting: Cardiovascular Disease

## 2017-10-19 ENCOUNTER — Telehealth: Payer: Self-pay | Admitting: Cardiovascular Disease

## 2017-10-19 NOTE — Telephone Encounter (Signed)
Daughter Angeline Slim asking for tramadol refill which was prescribed by Dr. Bernita Buffy. She states PCP can not prescribe narcotics at this time.  She takes one tablet at bedtime for arthritis.  Explained that Dr. Fletcher Anon would not be able to refill as he did not prescribe and has not seen patient since April 2018. Daughter made Jan 16 LEA appt for pt's new LE wound and prefers to wait for results before scheduling another appt w/Dr. Fletcher Anon.

## 2017-10-19 NOTE — Telephone Encounter (Signed)
Please advise 

## 2017-10-19 NOTE — Telephone Encounter (Signed)
°*  STAT* If patient is at the pharmacy, call can be transferred to refill team.   1. Which medications need to be refilled? (please list name of each medication and dose if known) tramadol 50MG    2. Which pharmacy/location (including street and city if local pharmacy) is medication to be sent to? [place]   3. Do they need a 30 day or 90 day supply? 90 day  Patient daughter [name] calling - says faxed request was denied Takes because she has arthritis and because she is on PLAVIX  States that she is calling Dr Fletcher Anon to refill because her PCP cannot prescribe narcotics currently  Prescription #  [phone] Please call to discuss

## 2017-10-23 ENCOUNTER — Encounter: Payer: Medicare PPO | Attending: Physician Assistant | Admitting: Physician Assistant

## 2017-10-23 DIAGNOSIS — M1990 Unspecified osteoarthritis, unspecified site: Secondary | ICD-10-CM | POA: Diagnosis not present

## 2017-10-23 DIAGNOSIS — I70235 Atherosclerosis of native arteries of right leg with ulceration of other part of foot: Secondary | ICD-10-CM | POA: Insufficient documentation

## 2017-10-23 DIAGNOSIS — I10 Essential (primary) hypertension: Secondary | ICD-10-CM | POA: Insufficient documentation

## 2017-10-23 DIAGNOSIS — I70232 Atherosclerosis of native arteries of right leg with ulceration of calf: Secondary | ICD-10-CM | POA: Diagnosis not present

## 2017-10-23 DIAGNOSIS — L97512 Non-pressure chronic ulcer of other part of right foot with fat layer exposed: Secondary | ICD-10-CM | POA: Insufficient documentation

## 2017-10-24 NOTE — Progress Notes (Signed)
[name], [name] ([id number]) Visit Report for 10/23/2017 Abuse/Suicide Risk Screen Details Patient Name: [name], [name] Date of Service: 10/23/2017 2:15 PM Medical Record Number: [id number] Patient Account Number: [id number] Date of Birth/Sex: 08-12-1919 (84 y.o. Female) Treating RN: Carolyne Fiscal, Debi Primary Care T[place]ia Harral: PATIENT, NO Other Clinician: Referring Gabriele Zwilling: Referral, Self Treating Brecken Walth/Extender: STONE III, HOYT Weeks in Treatment: 0 Abuse/Suicide Risk Screen Items Answer ABUSE/SUICIDE RISK SCREEN: Has anyone close to you tried to hurt or harm you recentlyo No Do you feel uncomfortable with anyone in your familyo No Has anyone forced you do things that you didnot want to doo No Do you have any thoughts of harming yourselfo No Patient displays signs or symptoms of abuse and/or neglect. No Electronic Signature(s) Signed: 10/23/2017 5:03:00 PM By: Alric Quan Entered By: Alric Quan on 10/23/2017 14:46:48 [name] ([id number]) -------------------------------------------------------------------------------- Activities of Daily Living Details Patient Name: [name] Date of Service: 10/23/2017 2:15 PM Medical Record Number: [id number] Patient Account Number: [id number] Date of Birth/Sex: August 07, 1919 (84 y.o. Female) Treating RN: Carolyne Fiscal, Debi Primary Care Dardan Shelton: PATIENT, NO Other Clinician: Referring Pantelis Elgersma: Referral, Self Treating Adonai Helzer/Extender: STONE III, HOYT Weeks in Treatment: 0 Activities of Daily Living Items Answer Activities of Daily Living (Please select one for each item) Drive Automobile Not Able Take Medications Need Assistance Use Telephone Need Assistance Care for Appearance Need Assistance Use Toilet Need Counsellor / Shower Need Assistance Dress Self Need Assistance Feed Self Completely Able Walk Need Assistance Get In / Out Bed Need Assistance Housework Not Able Prepare Meals Not Able Handle Money Not  Able Shop for Self Not Able Electronic Signature(s) Signed: 10/23/2017 5:03:00 PM By: Alric Quan Entered By: Alric Quan on 10/23/2017 14:48:11 [name] ([id number]) -------------------------------------------------------------------------------- Education Assessment Details Patient Name: [name] Date of Service: 10/23/2017 2:15 PM Medical Record Number: [id number] Patient Account Number: [id number] Date of Birth/Sex: April 12, 1919 (84 y.o. Female) Treating RN: Carolyne Fiscal, Debi Primary Care Benjamim Harnish: PATIENT, NO Other Clinician: Referring Samah Lapiana: Referral, Self Treating Deundra Furber/Extender: STONE III, HOYT Weeks in Treatment: 0 Primary Learner Assessed: Patient Learning Preferences/Education Level/Primary Language Learning Preference: Explanation, Printed Material Highest Education Level: Grade School Preferred Language: English Cognitive Barrier Assessment/Beliefs Language Barrier: No Translator Needed: No Memory Deficit: No Emotional Barrier: No Cultural/Religious Beliefs Affecting Medical Care: No Physical Barrier Assessment Impaired Vision: No Impaired Hearing: No Decreased Hand dexterity: No Knowledge/Comprehension Assessment Knowledge Level: Medium Comprehension Level: Medium Ability to understand written Medium instructions: Ability to understand verbal Medium instructions: Motivation Assessment Anxiety Level: Calm Cooperation: Cooperative Education Importance: Acknowledges Need Interest in Health Problems: Asks Questions Perception: Coherent Willingness to Engage in Self- Medium Management Activities: Readiness to Engage in Self- Medium Management Activities: Electronic Signature(s) Signed: 10/23/2017 5:03:00 PM By: Alric Quan Entered By: Alric Quan on 10/23/2017 14:48:38 [name] ([id number]) -------------------------------------------------------------------------------- Fall Risk Assessment Details Patient  Name: [name] Date of Service: 10/23/2017 2:15 PM Medical Record Number: [id number] Patient Account Number: [id number] Date of Birth/Sex: 1919/10/03 (84 y.o. Female) Treating RN: Carolyne Fiscal, Debi Primary Care Glenetta Kiger: PATIENT, NO Other Clinician: Referring Daleyssa Loiselle: Referral, Self Treating Cordai Rodrigue/Extender: STONE III, HOYT Weeks in Treatment: 0 Fall Risk Assessment Items Have you had 2 or more falls in the last 12 monthso 0 No Have you had any fall that resulted in injury in the last 12 monthso 0 No FALL RISK ASSESSMENT: History of falling - immediate or within 3 months 25 Yes Secondary diagnosis 0 No Ambulatory aid None/bed rest/wheelchair/nurse 0 No Crutches/cane/walker 15 Yes Furniture 0 No IV Access/Saline Lock 0 No Gait/Training  Normal/bed rest/immobile 0 No Weak 10 Yes Impaired 0 No Mental Status Oriented to own ability 0 Yes Electronic Signature(s) Signed: 10/23/2017 5:03:00 PM By: Alric Quan Entered By: Alric Quan on 10/23/2017 14:49:35 [name] ([id number]) -------------------------------------------------------------------------------- Foot Assessment Details Patient Name: [name] Date of Service: 10/23/2017 2:15 PM Medical Record Number: [id number] Patient Account Number: [id number] Date of Birth/Sex: June 16, 1919 (84 y.o. Female) Treating RN: Carolyne Fiscal, Debi Primary Care Breyona Swander: PATIENT, NO Other Clinician: Referring Xayvier Vallez: Referral, Self Treating Eren [place]uebla/Extender: STONE III, HOYT Weeks in Treatment: 0 Foot Assessment Items Site Locations + = Sensation present, - = Sensation absent, C = Callus, U = Ulcer R = Redness, W = Warmth, M = Maceration, PU = Pre-ulcerative lesion F = Fissure, S = Swelling, D = Dryness Assessment Right: Left: Other Deformity: No No Prior Foot Ulcer: No No Prior Amputation: No No Charcot Joint: No No Ambulatory Status: Ambulatory With Help Assistance Device: Walker GaitScientist, research (medical)) Signed: 10/23/2017 5:03:00 PM By: Alric Quan Entered By: Alric Quan on 10/23/2017 14:50:14 [name] ([id number]) -------------------------------------------------------------------------------- Nutrition Risk Assessment Details Patient Name: [name] Date of Service: 10/23/2017 2:15 PM Medical Record Number: [id number] Patient Account Number: [id number] Date of Birth/Sex: [birth year] (84 y.o. Female) Treating RN: Carolyne Fiscal, Debi Primary Care Valeria Boza: PATIENT, NO Other Clinician: Referring Haile Toppins: Referral, Self Treating Tarvares Lant/Extender: STONE III, HOYT Weeks in Treatment: 0 Height (in): Weight (lbs): Body Mass Index (BMI): Nutrition Risk Assessment Items NUTRITION RISK SCREEN: I have an illness or condition that made me change the kind and/or amount of 0 No food I eat I eat fewer than two meals per day 0 No I eat few fruits and vegetables, or milk products 0 No I have three or more drinks of beer, liquor or wine almost every day 0 No I have tooth or mouth problems that make it hard for me to eat 0 No I don't always have enough money to buy the food I need 0 No I eat alone most of the time 0 No I take three or more different prescribed or over-the-counter drugs a day 1 Yes Without wanting to, I have lost or gained 10 pounds in the last six months 0 No I am not always physically able to shop, cook and/or feed myself 0 No Nutrition Protocols Good Risk Protocol Moderate Risk Protocol Electronic Signature(s) Signed: 10/23/2017 5:03:00 PM By: Alric Quan Entered By: Alric Quan on 10/23/2017 14:49:51

## 2017-10-24 NOTE — Progress Notes (Addendum)
EVOLETH, NORDMEYER ([id number]) Visit Report for 10/23/2017 Chief Complaint Document Details Patient Name: [name], [name] Date of Service: 10/23/2017 2:15 PM Medical Record Number: [id number] Patient Account Number: [id number] Date of Birth/Sex: 12/07/1918 (84 y.o. Female) Treating RN: Carolyne Fiscal, Debi Primary Care Provider: PATIENT, NO Other Clinician: Referring Provider: Referral, Self Treating Provider/Extender: STONE III, HOYT Weeks in Treatment: 0 Information Obtained from: Patient Chief Complaint Right foot Ulcers Electronic Signature(s) Signed: 10/24/2017 10:30:07 AM By: Worthy Keeler PA-C Entered By: Worthy Keeler on 10/24/2017 08:06:52 [name] ([id number]) -------------------------------------------------------------------------------- HPI Details Patient Name: [name] Date of Service: 10/23/2017 2:15 PM Medical Record Number: [id number] Patient Account Number: [id number] Date of Birth/Sex: 1919/01/25 (84 y.o. Female) Treating RN: Carolyne Fiscal, Debi Primary Care Provider: PATIENT, NO Other Clinician: Referring Provider: Referral, Self Treating Provider/Extender: STONE III, HOYT Weeks in Treatment: 0 History of Present Illness HPI Description: 04/06/16; this is a 84 year old woman who arrives accompanied by 2 daughters for a wound on her left ankle and her left fifth toe. These have apparently been present for a year. I'm not quite certain how she came to this clinic however she was being followed by Sharlotte Alamo her podiatrist for these wounds. She was also referred to [facility] and they apparently did a test presumably arterial studies although we don't have any of these results and we couldn't get through to the office today. The family but has been applying a combination of Bactroban and a light bandage and perhaps more recently Silvadene cream. She did have an x-ray of the foot roughly 6 months ago at the podiatry office the family was unaware  that if there were any abnormalities. Apparently they have not seen any healing here. Our intake nurse noted a slight skin tear on the right anterior lower leg. Nobody seemed aware of this. ABIs calculated in this clinic was 0.3 on the right and 0.4 on the left I have reviewed things [facility]. There is very little information on this patient. She apparently follows in current total clinic which we don't have information from. She has mentioned already been to a [facility]. She has a history of hypothyroidism, nephrolithiasis arthritis and has had a previous mastectomy. 04/13/16; patient's x-ray was normal. She is already been to see Dr. Delana Meyer vascular surgery. Her arterial exam was from November 2016 this showed a left ABI of 0.62 her right of 0.76. Her duplex ultrasound of the left leg showed biphasic waves in the common femoral and distal femoral artery however monophasic waves in the superficial femoral artery proximal and mid biphasic it distal. 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 The patient tells me that she has pain at night when she tries to lie down which is improved by getting up and sitting in the chair this sounds like claudication at rest. Her wounds are on the left medial malleolus and the dorsal fifth toe small punched out wounds that are right on bone. We use Santyl last week 04/20/16: nurse informed me pt has declined evaluation for significant PAD. she denies systemic s/s of infections. 04/27/16;; the patient has had noninvasive arterial studies done in November 2016. ABI and the left was 0.62. Monophasic waves at the superficial femoral artery. Occluded to the posterior tibial artery. So had greater than 50% stenosis of the right superficial femoral and greater than 50% stenosis of the left superficial femoral artery. She had bilateral tibial peroneal artery disease. Both of the wounds on the left fifth toe and left lateral malleolus have  been present for more than a year.  They have been to see vein and vascular. The patient has some pain but miraculously I think the wounds have largely been stable. No evidence of infection 05/04/16; she goes for a noninvasive study tomorrow and then sees Dr. Fletcher Anon on Monday. By the time she is here next week we should have a better picture of whether something can be done with regards to her arterial flow. We continue to have ischemic-looking wounds on the left fifth toe and left lateral malleolus. 05/10/16; the patient went for her arterial studies and saw Dr. Fletcher Anon. As predicted she is felt to have critical limb ischemia. The feeling is that she has occlusion of the SFA. The feeling would she would be a candidate for a stent to the SFA. The patient did not make a decision to proceed with the procedure and she is here with family members to discuss this me today. He shouldn't has a lot of pain and cannot sleep and rest well at night per her family. 05/24/16; the patient went and had a complex revascularization/angioplasty of the left superficial femoral artery followed by drug-coated balloon angioplasty and spots stenting. She tolerated the procedure well. She was recommended for dual antiplatelet drugs with Plavix and aspirin for at least a month. She went yesterday for I believe follow-up serial Dopplers and ABIs although I don't see these results. The patient is unfortunately complaining of a lot of pain in her bilateral lower legs below the knees from the ankle to the knees. She apparently was prescribed lidocaine and apparently put this on her legs instead of over the wound areas. This may have something to do with it however there is a lot of edema in her bilateral legs I was able to find her arterial studies from yesterday. The left ABI has improved up to 0.66 post left SFA stent. The bilateral great toe indices remain abnormal with the left being in the 0.25 range. Duplex ultrasound showed her left SFA stent is  patent monophasic waveforms persist in the left leg 06/07/16; continued punched-out areas over the dorsal left fifth toe and left lateral malleolus. No major improvement 06/28/16; the areas over her dorsal left fifth toe and left lateral malleolus are covered in surface slough we are using Iodoflex 07/05/16. We have been using Iodoflex for 2-3 weeks now. I have not been debridement is because of pain JHANIA, ETHERINGTON ([id number]) 07/19/16 currently we have been using Iodoflex for roughly the past month. Previously we were unable to debride due to pain although today patient states that the pain is not nearly as severe as it has been in the past. In fact she rates this to be a 1 out of 10 and it worse to a to 10 with palpation of the wound. All and all her and her daughter feel like this is actually doing steadily better at this point in time. She is pleased with progress and we have been seeing her every 2 weeks. 08/02/16; this is a delightful 84 year old woman I have not seen in over a month. She has arterial insufficiency wounds remaining over the left lateral malleolus and the left fifth toe. With the help of Dr. Fletcher Anon we are able to get her revascularized on the left. The 2 wounds on the left have been making good progress and are definitely smaller especially the area over the left lateral malleolus. She is certainly in a lot less pain than she used to be although I still  think there is some claudication type pain. Unfortunately she is developed a area on the right lateral foot which is a small open area but I think is threatened. I suspect a small ischemic areas well. Also on her right fifth toe there is an area that is not open but looks as though it is receiving too much pressure from footwear. Finally an area over her right malleolus although I don't think this is on its way to anything ominous 08/17/16 patient presents today for follow up evaluation she tells me that she is really doing  fairly well from a pain standpoint compared to where she has been previous. She is tolerating the dressing changes without any complication. She continues to have discharge and drainage however. 08-30-16 Ms. Sirmons presents today with her daughter, she states that she continues to have intermittent shooting pains to the left lateral fifth toe at this site of seems to be a healed ulcer. She denies any other issues that her wound related since her last appointment. Her daughter states that she is in need of a tramadol refill at this time as she uses half a tablet at at bedtime to aid in sleep due to foot pain. Iodoflex has been used on all wounds in previous dressing changes. 09/13/16; the patient has ischemic wounds in her feet. The area over the left lateral malleolus is healed and the area over the left fifth toe looks improved.. She has an area on the lateral aspect of her right foot which is a small but deep wound. Finally she has a new open area on the medial aspect of the left medial malleolus. This is superficial 09/27/16; the area over the left lateral malleolus remains healed. The area over the left fifth toe has a surface and she states the pain is better but I don't think this is completely closed. She has an area on the lateral aspect of her right foot is a small but deep wound. The new open area on the medial aspect of the left ankle was closed from last time. 10/18/16; the area over her left lateral malleolus remains healed. The area over the left fifth toe has a surface over the top of this however there is no overt open area. Given the underlying issues of severe PAD and continued pain in the toe I would think it would be unlikely this is truly healed however I am not planning to debridement this area. The area on the right lateral foot which was a more recent wound is a small punched out painful area again has significant surface slough and nonviable tissue. It is clear the patient  still has claudication type pain however she remains functional. I have been giving her tramadol when necessary and that seems to help a lot with her pain the patient follows up with Dr. Fletcher Anon on January 18/18 11/01/16; patient missed her follow-up with Dr. Fletcher Anon last week due to a snow day. Follow-up is now on February 15. The areas on the right lateral malleolus and dorsal right fifth toe remain closed over. The fifth toe was tentative is there is a surface eschar however I'm not going to disturb this. Therefore, her only open areas on the right lateral foot. This is a small punched out area. We have been using Prisma. I'm quite convinced this is an ischemic wound 11/15/16; patient has follow-up with Dr. Fletcher Anon on February 15. She has no open wound on the left leg and doesn't really complain of claudication that  I can determine from talking to her. However on the right leg she clearly has some degree of claudication. The remaining open wound is on the right lateral foot. We have been using Iodosorb ointment 11/29/16; patient saw Dr. Fletcher Anon on 11/24/16. His comment is that her wound on her right lateral foot seems to be improving with local wound care. She has known significant right SFA disease. Her lower extremity Doppler will be repeated and according to the patient's daughter that appointment is on March 15. He is left with the thought that endovascular intervention in the right SFA might be necessary. The patient has quite a bit of pain especially at night related to the wound in her right foot. We changed her to Iodosorb ointment last week 12/13/16; this is a patient I follow every 2 weeks largely on a palliative approach at this point about ischemic wounds currently in the right foot. Initially she had them on her left lateral malleolus and left fifth toe. The area on the left lateral malleolus healed and the fifth toe has a surface eschar that I have elected not to remove. Both of these improved  after revascularization by Dr. Fletcher Anon. We have been using Iodosorb to the right lateral foot not much change here. 12/27/16; the patient had her arterial studies. This showed known bilateral SFA disease. Stable right ABI 0.5 to stable left ABI at 0.7 to the did not do it TBI on the right it was 0.61 on the left she has a stent in the long segment of her left SFA from 05/18/16. All of her wounds are somewhat better. She states her pain is better in the right foot is improved. 01/10/17- patient is here for follow-up dilation of her right lateral foot ulcer. She has been tolerating Iodosorb. She is voices no complaints or concerns 01/24/17; small ischemic wound on right lateral foot. still non viable cover. follows with Dr. Fletcher Anon on 4/30. No open area on left foot Lennox, Estill Bamberg ([id number]) 02/07/17 doing well and states she is pain free. Saw Dr. Fletcher Anon who said she is doing well and has "50% flow in the right leg and 70% in the left. According to her daughter he does not wish to do any further interventions 02/21/17; small ischemic wound on the right lateral foot. Per her daughter and the patient she has no open wound on the left foot and ankle which were her initial presenting wounds. still has claudication type pain at night she is been to see interventional cardiology who does not feel that she has a need for intervention on the right leg at present. She still has claudication type pain in the right leg which she manages at night with when necessary tramadol that I have prescribed 03/14/17; small ischemic wound on the right lateral foot. They've been dressing this at home with Iodosorb ointment. The patient does not complain of any pain. The wound has a surface eschar over it and there is really no visible open area. This is similar to how the areas on the left leg healed 04/11/17; the small ischemic wound on her right lateral foot is finally closed over. Small amount of eschar over the surface however I did  not attempt to remove this. The patient claims to be asymptomatic she is not having any claudication that I'm able to elicit although her activity is limited Readmission: 10/23/17 on evaluation today patient appears to be doing somewhat poorly in regard to her right foot where she has two ulcers at  this point. The worst is on the lateral aspect of her foot medial first metatarsal region is not nearly as bad. With that being said these did arise seemingly out of nowhere she has previously had a similar issue in the past these have always been ischemic wounds due to arterial insufficiency. She does see Dr. Fletcher Anon as her vascular specialist and it has been noted that her ABI's are somewhat low. She actually has a repeat evaluation coming up this March 2019 to reevaluate her blood flow. On the last check 12/22/16 it was noted that patient had stable ABI's in the lower moderate abnormal range in regard to the right in regard to the left and the upper moderate at normal range. Patient is having some discomfort though nothing severe at this point in time. She is seen with her daughter and son-in-law today. No fevers, chills, nausea, or vomiting noted at this time. Electronic Signature(s) Signed: 10/24/2017 10:30:07 AM By: Worthy Keeler PA-C Entered By: Worthy Keeler on 10/24/2017 08:10:59 [name] ([id number]) -------------------------------------------------------------------------------- Physical Exam Details Patient Name: [name] Date of Service: 10/23/2017 2:15 PM Medical Record Number: [id number] Patient Account Number: [id number] Date of Birth/Sex: [birth year] (84 y.o. Female) Treating RN: Carolyne Fiscal, Debi Primary Care Provider: PATIENT, NO Other Clinician: Referring Provider: Referral, Self Treating Provider/Extender: STONE III, HOYT Weeks in Treatment: 0 Constitutional patient is hypertensive.. pulse regular and within target range for patient.Marland Kitchen respirations regular,  non-labored and within target range for patient.Marland Kitchen temperature within target range for patient.. Thin and well-hydrated in no acute distress. Eyes conjunctiva clear no eyelid edema noted. pupils equal round and reactive to light and accommodation. Ears, Nose, Mouth, and Throat no gross abnormality of ear auricles or external auditory canals. normal hearing noted during conversation. mucus membranes moist. Respiratory normal breathing without difficulty. clear to auscultation bilaterally. Cardiovascular regular rate and rhythm with normal S1, S2. Faint posterior tibial and dorsalis pedis pulses bilateral lower extremities. no clubbing, cyanosis, significant edema, <3 sec cap refill. Gastrointestinal (GI) soft, non-tender, non-distended, +BS. no ventral hernia noted. Musculoskeletal Patient unable to walk without assistance. Psychiatric Patient is not able to cooperate in decision making regarding care. Patient is oriented to person only. pleasant and cooperative. Notes At this point patient's wounds both appear to have some [facility] covering although there does not appear to be any evidence of infection at this point which is good news. No sharp debridement was performed at this point due to the fact that patient does have poor arterial flow. She was also having significant discomfort. Electronic Signature(s) Signed: 10/24/2017 10:30:07 AM By: Worthy Keeler PA-C Entered By: Worthy Keeler on 10/24/2017 08:39:29 [name] ([id number]) -------------------------------------------------------------------------------- Physician Orders Details Patient Name: [name] Date of Service: 10/23/2017 2:15 PM Medical Record Number: [id number] Patient Account Number: [id number] Date of Birth/Sex: 07/19/1919 (84 y.o. 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 o Cleanse wound with mild soap and water o May Shower, gently pat wound dry  prior to applying new dressing. Wound #7 Left,Dorsal Foot o Clean wound with Normal Saline. o Cleanse wound with mild soap and water o May Shower, gently pat wound dry prior to applying new dressing. Anesthetic (add to Medication List) Wound #6 Left,Medial Toe Great o Topical Lidocaine 4% cream applied to wound bed prior to debridement (In Clinic Only). Wound #7 Left,Dorsal Foot o Topical Lidocaine 4% cream applied to wound bed prior to debridement (In Clinic Only). Primary Wound Dressing Wound #6 Left,Medial Toe Great o Santyl Ointment Wound #7 Left,Dorsal Foot o Santyl Ointment Secondary Dressing Wound #6 Left,Medial Toe Great o Dry Gauze o Other - coverlet Wound #7 Left,Dorsal Foot o Dry Gauze o Other - coverlet Dressing Change Frequency Wound [address], [place] ([id number]) o Change dressing every day. Wound #7 Left,Dorsal Foot o Change dressing every day. Follow-up Appointments Wound #6 Left,Medial Toe Great o Return Appointment in 1 week. Wound #7 Left,Dorsal Foot o Return Appointment in 1 week. Edema Control Wound #6 Left,Medial Toe Great o Elevate legs to the level of the heart and pump ankles as often as possible Wound #7 Left,Dorsal Foot o Elevate legs to the level of the heart and pump ankles as often as possible Additional  Orders / Instructions Wound #6 Left,Medial Toe Great o Increase protein intake. o Other: - Vitamin C, Zinc Wound #7 Left,Dorsal Foot o Increase protein intake. o Other: - Vitamin C, Zinc Patient Medications Allergies: codeine Notifications Medication Indication Start End lidocaine DOSE 1 - topical 4 % cream - 1 cream topical Santyl 10/23/2017 DOSE topical 250 unit/gram ointment - ointment topical applied to the wound bed nickel thick daily with dressing changes Electronic Signature(s) Signed: 10/23/2017 5:03:00 PM By: Alric Quan Signed: 10/24/2017 10:30:07 AM By: Worthy Keeler PA-C Previous Signature: 10/23/2017 3:32:28 PM Version By: Worthy Keeler PA-C Entered By: Alric Quan on 10/23/2017 15:43:34 [name] ([id number]) -------------------------------------------------------------------------------- Prescription 10/23/2017 Patient Name: [name] Provider: Worthy Keeler PA-C Date of Birth: [birth year] NPI#: [id number] Sex: F DEA#: [id number] Phone #: [phone] License #: Patient Address: [facility] [place], [place] [address] [address], [place], [place] [address] [phone] Allergies codeine Reaction: sick on stomach Severity: Severe Medication Medication: Route: Strength: Form: lidocaine 4 % topical cream topical 4% cream Class: TOPICAL LOCAL ANESTHETICS Dose: Frequency / Time: Indication: 1 1 cream topical Number of Refills: Number of Units: 0 Generic Substitution: Start Date: End Date: One Time Use: Substitution Permitted No Note to Pharmacy: Signature(s): Date(s): Electronic Signature(s) Signed: 10/23/2017 5:03:00 PM By: Alric Quan Signed: 10/24/2017 10:30:07 AM By: Worthy Keeler PA-C Entered By: Alric Quan on 10/23/2017 15:43:35 [name] ([id number]) [name]  ([id number]) --------------------------------------------------------------------------------  Problem List Details Patient Name: [name] Date of Service: 10/23/2017 2:15 PM Medical Record Number: [id number] Patient Account Number: [id number] Date of Birth/Sex: October 14, 1918 (84 y.o. 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 10/23/2017 Yes L97.512 Non-pressure chronic ulcer of other part of right foot with fat layer 10/23/2017 Yes exposed Zuehl (primary) hypertension 10/24/2017 Yes Inactive Problems Resolved Problems Electronic Signature(s) Signed: 10/24/2017 10:30:07 AM By:  Melburn Hake, Hoyt PA-C Entered By: Worthy Keeler on 10/24/2017 07:53:38 [name] ([id number]) -------------------------------------------------------------------------------- Progress Note Details Patient Name: MALIA, CORSI Date of Service: 10/23/2017 2:15 PM Medical Record Number: [id number] Patient Account Number: [id number] Date of Birth/Sex: 1918-11-21 (84 y.o. Female) Treating RN: Carolyne Fiscal, Debi Primary Care Provider: PATIENT, NO Other Clinician: Referring Provider: Referral, Self Treating Provider/Extender: STONE III, HOYT Weeks in Treatment: 0 Subjective Chief Complaint Information obtained from Patient Right foot Ulcers History of Present Illness (HPI) 04/06/16; this is a 84 year old woman who arrives accompanied by 2 daughters for a wound on her left ankle and her left fifth toe. These have apparently been present for a year. I'm not quite certain how she came to this clinic  however she was being followed by Sharlotte Alamo her podiatrist for these wounds. She was also referred to [facility] and they apparently did a test presumably arterial studies although we don't have any of these results and we couldn't get through to the office today. The family but has been applying a combination of Bactroban and a light bandage and perhaps more recently Silvadene cream. She did have an x-ray of the foot roughly 6 months ago at the podiatry office the family was unaware that if there were any abnormalities. Apparently they have not seen any healing here. Our intake nurse noted a slight skin tear on the right anterior lower leg. Nobody seemed aware of this. ABIs calculated in this clinic was 0.3 on the right and 0.4 on the left I have reviewed things [facility]. There is very little information on this patient. She apparently follows in current [facility] clinic which we don't have information from. She has mentioned already been to a [facility]. She has a history of hypothyroidism, nephrolithiasis arthritis and has had a previous mastectomy. 04/13/16; patient's x-ray was normal. She is already been to see Dr. Delana Meyer vascular surgery. Her arterial exam was from November 2016 this showed a left ABI of 0.62 her right of 0.76. Her duplex ultrasound of the left leg showed biphasic waves in the common femoral and distal femoral artery however monophasic waves in the superficial femoral artery proximal and mid biphasic it distal. 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 The patient tells me that she has pain at night when she tries to lie down which is improved by getting up and sitting in the chair this sounds like claudication at rest. Her wounds are on the left medial malleolus and the dorsal fifth toe small punched out wounds that are right on bone. We use Santyl last week 04/20/16: nurse informed me pt has declined evaluation for significant PAD. she denies systemic s/s of  infections. 04/27/16;; the patient has had noninvasive arterial studies done in November 2016. ABI and the left was 0.62. Monophasic waves at the superficial femoral artery. Occluded to the posterior tibial artery. So had greater than 50% stenosis of the right superficial femoral and greater than 50% stenosis of the left superficial femoral artery. She had bilateral tibial peroneal artery disease. Both of the wounds on the left fifth toe and left lateral malleolus have been present for more than a year. They have been to see vein and vascular. The patient has some pain but miraculously I think the wounds have largely been stable. No evidence of infection 05/04/16; she goes for a noninvasive study tomorrow and then sees Dr. Fletcher Anon on Monday. By the time she is here next week we should have a better picture of whether  something can be done with regards to her arterial flow. We continue to have ischemic-looking wounds on the left fifth toe and left lateral malleolus. 05/10/16; the patient went for her arterial studies and saw Dr. Fletcher Anon. As predicted she is felt to have critical limb ischemia. The feeling is that she has occlusion of the SFA. The feeling would she would be a candidate for a stent to the SFA. The patient did not make a decision to proceed with the procedure and she is here with family members to discuss this me today. He shouldn't has a lot of pain and cannot sleep and rest well at night per her family. 05/24/16; the patient went and had a complex revascularization/angioplasty of the left superficial femoral artery followed by drug-coated balloon angioplasty and spots stenting. She tolerated the procedure well. She was recommended for dual antiplatelet drugs with Plavix and aspirin for at least a month. She went yesterday for I believe follow-up serial Dopplers and ABIs although I don't see these results. The patient is unfortunately complaining of a lot of pain in her bilateral lower  legs below the knees from the ankle to the knees. She apparently was prescribed lidocaine and apparently put this on her legs instead of over the wound areas. This may have something to do with it however there is a lot of edema in her bilateral legs I was able to find her arterial studies from yesterday. The left ABI has improved up to 0.66 post left SFA stent. The bilateral Dai, Lacretia ([id number]) great toe indices remain abnormal with the left being in the 0.25 range. Duplex ultrasound showed her left SFA stent is patent monophasic waveforms persist in the left leg 06/07/16; continued punched-out areas over the dorsal left fifth toe and left lateral malleolus. No major improvement 06/28/16; the areas over her dorsal left fifth toe and left lateral malleolus are covered in surface slough we are using Iodoflex 07/05/16. We have been using Iodoflex for 2-3 weeks now. I have not been debridement is because of pain 07/19/16 currently we have been using Iodoflex for roughly the past month. Previously we were unable to debride due to pain although today patient states that the pain is not nearly as severe as it has been in the past. In fact she rates this to be a 1 out of 10 and it worse to a to 10 with palpation of the wound. All and all her and her daughter feel like this is actually doing steadily better at this point in time. She is pleased with progress and we have been seeing her every 2 weeks. 08/02/16; this is a delightful 84 year old woman I have not seen in over a month. She has arterial insufficiency wounds remaining over the left lateral malleolus and the left fifth toe. With the help of Dr. Fletcher Anon we are able to get her revascularized on the left. The 2 wounds on the left have been making good progress and are definitely smaller especially the area over the left lateral malleolus. She is certainly in a lot less pain than she used to be although I still think there is some claudication  type pain. Unfortunately she is developed a area on the right lateral foot which is a small open area but I think is threatened. I suspect a small ischemic areas well. Also on her right fifth toe there is an area that is not open but looks as though it is receiving too much pressure from footwear. Finally an area  over her right malleolus although I don't think this is on its way to anything ominous 08/17/16 patient presents today for follow up evaluation she tells me that she is really doing fairly well from a pain standpoint compared to where she has been previous. She is tolerating the dressing changes without any complication. She continues to have discharge and drainage however. 08-30-16 Ms. Mcentee presents today with her daughter, she states that she continues to have intermittent shooting pains to the left lateral fifth toe at this site of seems to be a healed ulcer. She denies any other issues that her wound related since her last appointment. Her daughter states that she is in need of a tramadol refill at this time as she uses half a tablet at at bedtime to aid in sleep due to foot pain. Iodoflex has been used on all wounds in previous dressing changes. 09/13/16; the patient has ischemic wounds in her feet. The area over the left lateral malleolus is healed and the area over the left fifth toe looks improved.. She has an area on the lateral aspect of her right foot which is a small but deep wound. Finally she has a new open area on the medial aspect of the left medial malleolus. This is superficial 09/27/16; the area over the left lateral malleolus remains healed. The area over the left fifth toe has a surface and she states the pain is better but I don't think this is completely closed. She has an area on the lateral aspect of her right foot is a small but deep wound. The new open area on the medial aspect of the left ankle was closed from last time. 10/18/16; the area over her left lateral  malleolus remains healed. The area over the left fifth toe has a surface over the top of this however there is no overt open area. Given the underlying issues of severe PAD and continued pain in the toe I would think it would be unlikely this is truly healed however I am not planning to debridement this area. The area on the right lateral foot which was a more recent wound is a small punched out painful area again has significant surface slough and nonviable tissue. It is clear the patient still has claudication type pain however she remains functional. I have been giving her tramadol when necessary and that seems to help a lot with her pain the patient follows up with Dr. Fletcher Anon on January 18/18 11/01/16; patient missed her follow-up with Dr. Fletcher Anon last week due to a snow day. Follow-up is now on February 15. The areas on the right lateral malleolus and dorsal right fifth toe remain closed over. The fifth toe was tentative is there is a surface eschar however I'm not going to disturb this. Therefore, her only open areas on the right lateral foot. This is a small punched out area. We have been using Prisma. I'm quite convinced this is an ischemic wound 11/15/16; patient has follow-up with Dr. Fletcher Anon on February 15. She has no open wound on the left leg and doesn't really complain of claudication that I can determine from talking to her. However on the right leg she clearly has some degree of claudication. The remaining open wound is on the right lateral foot. We have been using Iodosorb ointment 11/29/16; patient saw Dr. Fletcher Anon on 11/24/16. His comment is that her wound on her right lateral foot seems to be improving with local wound care. She has known significant right  SFA disease. Her lower extremity Doppler will be repeated and according to the patient's daughter that appointment is on March 15. He is left with the thought that endovascular intervention in the right SFA might be necessary. The patient  has quite a bit of pain especially at night related to the wound in her right foot. We changed her to Iodosorb ointment last week 12/13/16; this is a patient I follow every 2 weeks largely on a palliative approach at this point about ischemic wounds currently in the right foot. Initially she had them on her left lateral malleolus and left fifth toe. The area on the left lateral malleolus healed and the fifth toe has a surface eschar that I have elected not to remove. Both of these improved after revascularization by Dr. Fletcher Anon. We have been using Iodosorb to the right lateral foot not much change here. 12/27/16; the patient had her arterial studies. This showed known bilateral SFA disease. Stable right ABI 0.5 to stable left ABI at 0.7 to the did not do it TBI on the right it was 0.61 on the left she has a stent in the long segment of her left SFA from [place], [place] ([id number]) 05/18/16. All of her wounds are somewhat better. She states her pain is better in the right foot is improved. 01/10/17- patient is here for follow-up dilation of her right lateral foot ulcer. She has been tolerating Iodosorb. She is voices no complaints or concerns 01/24/17; small ischemic wound on right lateral foot. still non viable cover. follows with Dr. Fletcher Anon on 4/30. No open area on left foot 02/07/17 doing well and states she is pain free. Saw Dr. Fletcher Anon who said she is doing well and has "50% flow in the right leg and 70% in the left. According to her daughter he does not wish to do any further interventions 02/21/17; small ischemic wound on the right lateral foot. Per her daughter and the patient she has no open wound on the left foot and ankle which were her initial presenting wounds. still has claudication type pain at night she is been to see interventional cardiology who does not feel that she has a need for intervention on the right leg at present. She still has claudication type pain in the right leg which she manages  at night with when necessary tramadol that I have prescribed 03/14/17; small ischemic wound on the right lateral foot. They've been dressing this at home with Iodosorb ointment. The patient does not complain of any pain. The wound has a surface eschar over it and there is really no visible open area. This is similar to how the areas on the left leg healed 04/11/17; the small ischemic wound on her right lateral foot is finally closed over. Small amount of eschar over the surface however I did not attempt to remove this. The patient claims to be asymptomatic she is not having any claudication that I'm able to elicit although her activity is limited Readmission: 10/23/17 on evaluation today patient appears to be doing somewhat poorly in regard to her right foot where she has two ulcers at this point. The worst is on the lateral aspect of her foot medial first metatarsal region is not nearly as bad. With that being said these did arise seemingly out of nowhere she has previously had a similar issue in the past these have always been ischemic wounds due to arterial insufficiency. She does see Dr. Fletcher Anon as her vascular specialist and it has been noted  that her ABI's are somewhat low. She actually has a repeat evaluation coming up this March 2019 to reevaluate her blood flow. On the last check 12/22/16 it was noted that patient had stable ABI's in the lower moderate abnormal range in regard to the right in regard to the left and the upper moderate at normal range. Patient is having some discomfort though nothing severe at this point in time. She is seen with her daughter and son-in-law today. No fevers, chills, nausea, or vomiting noted at this time. Wound History Patient presents with 1 open wound that has been present for approximately 1.5 weeks. Patient has been treating wound in the following manner: bandaide. Laboratory tests have not been performed in the last month. Patient reportedly has not  tested positive for an antibiotic resistant organism. Patient reportedly has not tested positive for osteomyelitis. Patient reportedly has had testing performed to evaluate circulation in the legs. Patient experiences the following problems associated with their wounds: swelling. Patient History Information obtained from Patient. Allergies codeine (Severity: Severe, Reaction: sick on stomach) Family History Diabetes - Mother, Heart Disease - Siblings, Stroke - Siblings, No family history of Cancer, Hereditary Spherocytosis, Hypertension, Kidney Disease, Lung Disease, Seizures, Thyroid Problems, Tuberculosis. Social History Never smoker, Marital Status - Widowed, Alcohol Use - Never, Drug Use - No History, Caffeine Use - Daily. HAIDYN, KILBURG ([id number]) Objective Constitutional patient is hypertensive.. pulse regular and within target range for patient.Marland Kitchen respirations regular, non-labored and within target range for patient.Marland Kitchen temperature within target range for patient.. Thin and well-hydrated in no acute distress. Vitals Time Taken: 2:42 PM, Temperature: 98.2 F, Pulse: 74 bpm, Respiratory Rate: 16 breaths/min, Blood Pressure: 169/56 mmHg. Eyes conjunctiva clear no eyelid edema noted. pupils equal round and reactive to light and accommodation. Ears, Nose, Mouth, and Throat no gross abnormality of ear auricles or external auditory canals. normal hearing noted during conversation. mucus membranes moist. Respiratory normal breathing without difficulty. clear to auscultation bilaterally. Cardiovascular regular rate and rhythm with normal S1, S2. Faint posterior tibial and dorsalis pedis pulses bilateral lower extremities. no clubbing, cyanosis, significant edema, Gastrointestinal (GI) soft, non-tender, non-distended, +BS. no ventral hernia noted. Musculoskeletal Patient unable to walk without assistance. Psychiatric Patient is not able to cooperate in decision making regarding  care. Patient is oriented to person only. pleasant and cooperative. General Notes: At this point patient's wounds both appear to have some [facility] covering although there does not appear to be any evidence of infection at this point which is good news. No sharp debridement was performed at this point due to the fact that patient does have poor arterial flow. She was also having significant discomfort. Integumentary (Hair, Skin) Wound #6 status is Open. Original cause of wound was Gradually Appeared. The wound is located on the [employer]. The wound measures 1cm length x 0.2cm width x 0.1cm depth; 0.157cm^2 area and 0.016cm^3 volume. There is no tunneling or undermining noted. There is a large amount of serous drainage noted. The wound margin is distinct with the outline attached to the wound base. There is large (67-100%) red granulation within the wound bed. There is no necrotic tissue within the wound bed. Periwound temperature was noted as No Abnormality. The periwound has tenderness on palpation. Wound #7 status is Open. Original cause of wound was Gradually Appeared. The wound is located on the Right,Dorsal Foot. The wound measures 0.8cm length x 0.9cm width x 0.2cm depth; 0.565cm^2 area and 0.113cm^3 volume. There is no tunneling or undermining noted.  There is a large amount of serous drainage noted. The wound margin is distinct with the outline attached to the wound base. There is small (1-33%) pink granulation within the wound bed. There is a large (67-100%) amount of necrotic tissue within the wound bed including Adherent Slough. Periwound temperature was noted as No Abnormality. The periwound has tenderness on palpation. 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 Cleanse wound with mild soap and water May Shower, gently pat wound dry prior to applying new dressing. Wound #7 Left,Dorsal Foot: Clean wound with Normal Saline. Cleanse wound with mild soap and water May Shower, gently pat wound dry prior to applying new dressing. Anesthetic (add to Medication List): Wound #6 Left,Medial Toe Great: Topical Lidocaine 4% cream applied to wound bed prior to debridement (In Clinic Only). Wound #7 Left,Dorsal Foot: Topical Lidocaine 4% cream applied to wound bed prior to debridement (In Clinic Only). Primary Wound Dressing: Wound #6 Left,Medial Toe Great: Santyl Ointment Wound #7 Left,Dorsal Foot: Santyl Ointment Secondary Dressing: Wound #6 Left,Medial Toe Great: Dry Gauze Other - coverlet Wound #7 Left,Dorsal Foot: Dry Gauze Other - coverlet Dressing Change Frequency: Wound #6 Left,Medial Toe Great: Change dressing every day. Wound #7 Left,Dorsal Foot: Change dressing every day. Follow-up Appointments: Wound #6 Left,Medial Toe Great: Return Appointment in 1 week. Wound #7 Left,Dorsal Foot: Return Appointment in 1 week. Edema Control: Wound #6 Left,Medial Toe Great: Elevate legs to the level of the heart and pump ankles as often as possible Wound #7 Left,Dorsal Foot: Elevate legs to the level of the heart and pump ankles as often as possible Calleros, Denelle ([id number]) Additional Orders / Instructions: Wound #6 Left,Medial Toe Great: Increase protein intake. Other: - Vitamin C, Zinc Wound #7 Left,Dorsal Foot: Increase protein intake. Other: - Vitamin C, Zinc The following medication(s) was prescribed: lidocaine topical 4 % cream 1 1 cream topical was prescribed at facility Santyl topical 250 unit/gram ointment ointment topical  applied to the wound bed nickel thick daily with dressing changes starting 10/23/2017 At this point I'm gonna recommend that we continue with the Santyl at home as well as this will help with mechanical debridement. It also give her prescription for tramadol 50 mg one at bedtime dispenses seven tablets no refills to help with her sleep at night due to the pain that she is experiencing. She does not have a primary at this point as for some reason her primary is no longer able to accept Medicare and they feel like he may have gotten in trouble. We will see were things stand in one weeks time with this wound hopefully it will start to clean up as far as the slough is concerned and fill in. Please see above for specific wound care orders. We will see patient for re-evaluation in 1 week(s) here in the clinic. If anything worsens or changes patient will contact our office for additional recommendations. Electronic Signature(s) Signed: 10/27/2017 9:37:59 AM By: Gretta Cool, BSN, RN, CWS, Kim RN, BSN Signed: 11/01/2017 7:57:29 AM By: Worthy Keeler PA-C Previous Signature: 10/24/2017 10:30:07 AM Version By: Worthy Keeler PA-C Entered By: Gretta Cool BSN, RN, CWS, Kim  on 10/27/2017 09:37:59 DEZERAY, PUCCIO ([id number]) -------------------------------------------------------------------------------- ROS/PFSH Details Patient Name: LUVINIA, LUCY Date of Service: 10/23/2017 2:15 PM Medical Record Number: [id number] Patient Account Number: [id number] Date of Birth/Sex: 1919/06/19 (84 y.o. Female) Treating RN: Carolyne Fiscal, Debi Primary Care Provider: PATIENT, NO Other Clinician: Referring Provider: Referral, Self Treating Provider/Extender: STONE III, HOYT Weeks in Treatment: 0 Information Obtained From Patient Wound History Do you currently have one or more open woundso Yes How many open wounds do you currently haveo 1 Approximately how long have you had your woundso 1.5 weeks How have you been treating your  wound(s) until nowo bandaide Has your wound(s) ever healed and then re-openedo No Have you had any lab work done in the past montho No Have you tested positive for an antibiotic resistant organism (MRSA, VRE)o No Have you tested positive for osteomyelitis (bone infection)o No Have you had any tests for circulation on your legso Yes [facility] test [facility] Have you had other problems associated with your woundso Swelling Eyes Medical History: Positive for: Cataracts - had surgery Cardiovascular Medical History: Positive for: Hypertension Musculoskeletal Medical History: Positive for: Osteoarthritis Oncologic Medical History: Negative for: Received Chemotherapy; Received Radiation HBO Extended History Items Eyes: Cataracts Immunizations Pneumococcal Vaccine: Received Pneumococcal Vaccination: Yes Implantable Devices Family and Social History Cancer: No; Diabetes: Yes - Mother; Heart Disease: Yes - Siblings; Hereditary Spherocytosis: No; Hypertension: No; Kidney Disease: No; Lung Disease: No; Seizures: No; Stroke: Yes - Siblings; Thyroid Problems: No; Tuberculosis: No; Never ABRISH, ERNY ([id number]) smoker; Marital Status - Widowed; Alcohol Use: Never; Drug Use: No History; Caffeine Use: Daily; Financial Concerns: No; Food, Clothing or Shelter Needs: No; Support System Lacking: No; Transportation Concerns: No; Advanced Directives: No; Patient does not want information on Advanced Directives; Do not resuscitate: No; Living Will: No; Medical Power of Attorney: No Electronic Signature(s) Signed: 10/23/2017 5:03:00 PM By: Alric Quan Signed: 10/24/2017 10:30:07 AM By: Worthy Keeler PA-C Entered By: Alric Quan on 10/23/2017 14:46:41 [name] ([id number]) -------------------------------------------------------------------------------- SuperBill Details Patient Name: [name] Date of Service: 10/23/2017 Medical Record Number: [id number] Patient  Account Number: [id number] Date of Birth/Sex: 1919-03-20 (84 y.o. Female) Treating RN: Carolyne Fiscal, Debi Primary Care Provider: PATIENT, NO Other Clinician: Referring Provider: Referral, Self Treating Provider/Extender: STONE III, HOYT Weeks in Treatment: 0 Diagnosis Coding ICD-10 Codes Code Description I70.235 Atherosclerosis of native arteries of right leg with ulceration of other part of foot I70.232 Atherosclerosis of native arteries of right leg with ulceration of calf L97.512 Non-pressure chronic ulcer of other part of right foot with fat layer exposed Facility Procedures CPT4 Code: 21975883 Description: 99214 - WOUND CARE VISIT-LEV 4 EST PT Modifier: Quantity: 1 Physician Procedures CPT4 Code Description: [id number] - WC PHYS LEVEL 4 - EST PT ICD-10 Diagnosis Description I70.235 Atherosclerosis of native arteries of right leg with ulceration o I70.232 Atherosclerosis of native arteries of right leg with ulceration o L97.512  Non-pressure chronic ulcer of other part of right foot with fat l Modifier: f other part of fo f calf ayer exposed Quantity: 1 ot Electronic Signature(s) Signed: 10/24/2017 10:30:07 AM By: Worthy Keeler PA-C Previous Signature: 10/23/2017 4:30:58 PM Version By: Alric Quan Entered By: Worthy Keeler on 10/24/2017 08:41:49

## 2017-10-25 ENCOUNTER — Ambulatory Visit (INDEPENDENT_AMBULATORY_CARE_PROVIDER_SITE_OTHER): Payer: Medicare PPO

## 2017-10-25 DIAGNOSIS — I739 Peripheral vascular disease, unspecified: Secondary | ICD-10-CM | POA: Diagnosis not present

## 2017-10-25 NOTE — Progress Notes (Signed)
IREM, STONEHAM ([id number]) Visit Report for 10/23/2017 Allergy List Details Patient Name: [name], [name] Date of Service: 10/23/2017 2:15 PM Medical Record Number: [id number] Patient Account Number: [id number] Date of Birth/Sex: [birth year] (84 y.o. Female) Treating RN: Carolyne Fiscal, Debi Primary Care Mikenzie Mccannon: PATIENT, NO Other Clinician: Referring Jamy Cleckler: Referral, Self Treating Emonee Winkowski/Extender: STONE III, HOYT Weeks in Treatment: 0 Allergies Active Allergies codeine Reaction: sick on stomach Severity: Severe Allergy Notes Electronic Signature(s) Signed: 10/23/2017 5:03:00 PM By: Alric Quan Entered By: Alric Quan on 10/23/2017 14:45:58 [name] ([id number]) -------------------------------------------------------------------------------- Arrival Information Details Patient Name: [name] Date of Service: 10/23/2017 2:15 PM Medical Record Number: [id number] Patient Account Number: [id number] Date of Birth/Sex: [birth year] (84 y.o. Female) Treating RN: Carolyne Fiscal, Debi Primary Care Nari Vannatter: PATIENT, NO Other Clinician: Referring Aadhya Bustamante: Referral, Self Treating Samarie Pinder/Extender: STONE III, HOYT Weeks in Treatment: 0 Visit Information Patient Arrived: Walker Arrival Time: 14:41 Accompanied By: daughter, son in law Transfer Assistance: EasyPivot Patient Lift Patient Identification Verified: Yes Secondary Verification Process Yes Completed: Patient Requires Transmission-Based No Precautions: Patient Has Alerts: No History Since Last Visit All ordered tests and consults were completed: No Added or deleted any medications: No Any new allergies or adverse reactions: No Had a fall or experienced change in activities of daily living that may affect risk of falls: No Signs or symptoms of abuse/neglect since last visito No Hospitalized since last visit: No Has Dressing in Place as Prescribed: Yes Electronic Signature(s) Signed: 10/23/2017 5:03:00 PM  By: Alric Quan Entered By: Alric Quan on 10/23/2017 14:41:55 [name] ([id number]) -------------------------------------------------------------------------------- Clinic Level of Care Assessment Details Patient Name: [name] Date of Service: 10/23/2017 2:15 PM Medical Record Number: [id number] Patient Account Number: [id number] Date of Birth/Sex: 1919-04-30 (84 y.o. Female) Treating RN: Carolyne Fiscal, Debi Primary Care Kutler Vanvranken: PATIENT, NO Other Clinician: Referring Ladislaus Repsher: Referral, Self Treating Dene Landsberg/Extender: STONE III, HOYT Weeks in Treatment: 0 Clinic Level of Care Assessment Items TOOL 2 Quantity Score X - Use when only an EandM is performed on the INITIAL visit 1 0 ASSESSMENTS - Nursing Assessment / Reassessment X - General Physical Exam (combine w/ comprehensive assessment (listed just below) when 1 20 performed on new pt. evals) X- 1 25 Comprehensive Assessment (HX, ROS, Risk Assessments, Wounds Hx, etc.) ASSESSMENTS - Wound and Skin Assessment / Reassessment []  - Simple Wound Assessment / Reassessment - one wound 0 X- 2 5 Complex Wound Assessment / Reassessment - multiple wounds []  - 0 Dermatologic / Skin Assessment (not related to wound area) ASSESSMENTS - Ostomy and/or Continence Assessment and Care []  - Incontinence Assessment and Management 0 []  - 0 Ostomy Care Assessment and Management (repouching, etc.) PROCESS - Coordination of Care X - Simple Patient / Family Education for ongoing care 1 15 []  - 0 Complex (extensive) Patient / Family Education for ongoing care []  - 0 Staff obtains Programmer, systems, Records, Test Results / Process Orders []  - 0 Staff telephones HHA, Nursing Homes / Clarify orders / etc []  - 0 Routine Transfer to another Facility (non-emergent condition) []  - 0 Routine Hospital Admission (non-emergent condition) X- 1 15 New Admissions / Biomedical engineer / Ordering NPWT, Apligraf, etc. []  - 0 Emergency  Hospital Admission (emergent condition) X- 1 10 Simple Discharge Coordination []  - 0 Complex (extensive) Discharge Coordination PROCESS - Special Needs []  - Pediatric / Minor Patient Management 0 []  - 0 Isolation Patient Management [name], [name] ([id number]) []  - 0 Hearing / Language / Visual special needs []  - 0 Assessment of Community assistance (transportation, D/C planning, etc.) []  -  0 Additional assistance / Altered mentation []  - 0 Support Surface(s) Assessment (bed, cushion, seat, etc.) INTERVENTIONS - Wound Cleansing / Measurement X - Wound Imaging (photographs - any number of wounds) 1 5 []  - 0 Wound Tracing (instead of photographs) []  - 0 Simple Wound Measurement - one wound X- 2 5 Complex Wound Measurement - multiple wounds []  - 0 Simple Wound Cleansing - one wound X- 2 5 Complex Wound Cleansing - multiple wounds INTERVENTIONS - Wound Dressings X - Small Wound Dressing one or multiple wounds 2 10 []  - 0 Medium Wound Dressing one or multiple wounds []  - 0 Large Wound Dressing one or multiple wounds []  - 0 Application of Medications - injection INTERVENTIONS - Miscellaneous []  - External ear exam 0 []  - 0 Specimen Collection (cultures, biopsies, blood, body fluids, etc.) []  - 0 Specimen(s) / Culture(s) sent or taken to Lab for analysis []  - 0 Patient Transfer (multiple staff / Civil Service fast streamer / Similar devices) []  - 0 Simple Staple / Suture removal (25 or less) []  - 0 Complex Staple / Suture removal (26 or more) []  - 0 Hypo / Hyperglycemic Management (close monitor of Blood Glucose) []  - 0 Ankle / Brachial Index (ABI) - do not check if billed separately Has the patient been seen at the hospital within the last three years: Yes Total Score: 140 Level Of Care: New/Established - Level 4 Electronic Signature(s) Signed: 10/23/2017 5:03:00 PM By: Alric Quan Entered By: Alric Quan on 10/23/2017 [name], [name]  ([id number]) -------------------------------------------------------------------------------- Encounter Discharge Information Details Patient Name: [name] Date of Service: 10/23/2017 2:15 PM Medical Record Number: [id number] Patient Account Number: [id number] Date of Birth/Sex: August 06, 1919 (84 y.o. Female) Treating RN: Carolyne Fiscal, Debi Primary Care Leno Mathes: PATIENT, NO Other Clinician: Referring Ronan Dion: Referral, Self Treating Jahvier Aldea/Extender: STONE III, HOYT Weeks in Treatment: 0 Encounter Discharge Information Items Discharge Pain Level: 4 Discharge Condition: Stable Ambulatory Status: Wheelchair Discharge Destination: Home Transportation: Private Auto daughter, son in [place] By: law Schedule Follow-up Appointment: Yes Medication Reconciliation completed and No provided to Patient/Care Willo Yoon: Provided on Clinical Summary of Care: 10/23/2017 Form Type Recipient Paper Patient [facility] Electronic Signature(s) Signed: 10/25/2017 11:52:14 AM By: Ruthine Dose Entered By: Ruthine Dose on 10/23/2017 15:41:36 [name] ([id number]) -------------------------------------------------------------------------------- Lower Extremity Assessment Details Patient Name: [name] Date of Service: 10/23/2017 2:15 PM Medical Record Number: [id number] Patient Account Number: [id number] Date of Birth/Sex: 04-15-1919 (84 y.o. Female) Treating RN: Carolyne Fiscal, Debi Primary Care Bassel Gaskill: PATIENT, NO Other Clinician: Referring Daja Shuping: Referral, Self Treating Mckinze Poirier/Extender: STONE III, HOYT Weeks in Treatment: 0 Vascular Assessment Pulses: Dorsalis Pedis Palpable: [Right:Yes] Posterior Tibial Extremity colors, hair growth, and conditions: Extremity Color: [Right:Mottled] Temperature of Extremity: [Right:Warm] Capillary Refill: [Right:< 3 seconds] Toe Nail Assessment Left: Right: Thick: Yes Discolored: Yes Deformed: No Improper Length and Hygiene:  No Electronic Signature(s) Signed: 10/23/2017 5:03:00 PM By: Alric Quan Entered By: Alric Quan on 10/23/2017 15:02:05 [name] ([id number]) -------------------------------------------------------------------------------- Multi Wound Chart Details Patient Name: [name] Date of Service: 10/23/2017 2:15 PM Medical Record Number: [id number] Patient Account Number: [id number] Date of Birth/Sex: 02/04/1919 (84 y.o. Female) Treating RN: Carolyne Fiscal, Debi Primary Care Yojan Paskett: PATIENT, NO Other Clinician: Referring Mamadou Breon: Referral, Self Treating Otilio Groleau/Extender: STONE III, HOYT Weeks in Treatment: 0 Vital Signs Height(in): Pulse(bpm): 74 Weight(lbs): Blood Pressure(mmHg): 169/56 Body Mass Index(BMI): Temperature(F): 98.2 Respiratory Rate 16 (breaths/min): Photos: [6:No Photos] [7:No Photos] [N/A:N/A] Wound Location: [6:Left Toe Great - Medial] [7:Left Foot - Dorsal] [N/A:N/A] Wounding Event: [6:Gradually Appeared] [7:Gradually Appeared] [N/A:N/A] Primary  Etiology: [6:To be determined] [7:To be determined] [N/A:N/A] Comorbid History: [6:Cataracts, Hypertension, Osteoarthritis] [7:Cataracts, Hypertension, Osteoarthritis] [N/A:N/A] Date Acquired: [6:10/16/2017] [7:10/11/2017] [N/A:N/A] Weeks of Treatment: [6:0] [7:0] [N/A:N/A] Wound Status: [6:Open] [7:Open] [N/A:N/A] Measurements L x W x D [6:1x0.2x0.1] [7:0.8x0.9x0.2] [N/A:N/A] (cm) Area (cm) : [6:0.157] [7:0.565] [N/A:N/A] Volume (cm) : [6:0.016] [7:0.113] [N/A:N/A] % Reduction in Area: [6:0.00%] [7:0.00%] [N/A:N/A] % Reduction in Volume: [6:0.00%] [7:0.00%] [N/A:N/A] Classification: [6:Partial Thickness] [7:Partial Thickness] [N/A:N/A] Exudate Amount: [6:Large] [7:Large] [N/A:N/A] Exudate Type: [6:Serous] [7:Serous] [N/A:N/A] Exudate Color: [6:amber] [7:amber] [N/A:N/A] Wound Margin: [6:Distinct, outline attached] [7:Distinct, outline attached] [N/A:N/A] Granulation Amount: [6:Large (67-100%)]  [7:Small (1-33%)] [N/A:N/A] Granulation Quality: [6:Red] [7:Pink] [N/A:N/A] Necrotic Amount: [6:None Present (0%)] [7:Large (67-100%)] [N/A:N/A] Exposed Structures: [6:Fascia: No Fat Layer (Subcutaneous Tissue) Exposed: No Tendon: No Muscle: No Joint: No Bone: No] [7:Fascia: No Fat Layer (Subcutaneous Tissue) Exposed: No Tendon: No Muscle: No Joint: No Bone: No] [N/A:N/A] Epithelialization: [6:None] [7:None] [N/A:N/A] Periwound Skin Texture: [6:No Abnormalities Noted] [7:No Abnormalities Noted] [N/A:N/A] Periwound Skin Moisture: [6:No Abnormalities Noted] [7:No Abnormalities Noted] [N/A:N/A] Periwound Skin Color: [6:No Abnormalities Noted] [7:No Abnormalities Noted] [N/A:N/A] Temperature: [6:No Abnormality] [7:No Abnormality] [N/A:N/A] Tenderness on Palpation: [6:Yes] [7:Yes] [N/A:N/A] Wound Preparation: [N/A:N/A] Ulcer Cleansing: Ulcer Cleansing: Rinsed/Irrigated with Saline Rinsed/Irrigated with Saline Topical Anesthetic Applied: Topical Anesthetic Applied: Other: lidocaine 4% Other: lidocaine 4% Treatment Notes Electronic Signature(s) Signed: 10/23/2017 5:03:00 PM By: Alric Quan Entered By: Alric Quan on 10/23/2017 15:13:21 [name] ([id number]) -------------------------------------------------------------------------------- Multi-Disciplinary Care Plan Details Patient Name: [name] Date of Service: 10/23/2017 2:15 PM Medical Record Number: [id number] Patient Account Number: [id number] Date of Birth/Sex: [birth year] (84 y.o. Female) Treating RN: Carolyne Fiscal, Debi Primary Care Djimon Lundstrom: PATIENT, NO Other Clinician: Referring Skylee Baird: Referral, Self Treating Mattie Novosel/Extender: STONE III, HOYT Weeks in Treatment: 0 Active Inactive ` Abuse / Safety / Falls / Self Care Management Nursing Diagnoses: History of Falls Potential for falls Goals: Patient will not experience any injury related to falls Date Initiated: 10/23/2017 Target Resolution Date:  02/17/2018 Goal Status: Active Interventions: Assess Activities of Daily Living upon admission and as needed Assess fall risk on admission and as needed Assess: immobility, friction, shearing, incontinence upon admission and as needed Assess impairment of mobility on admission and as needed per policy Assess personal safety and home safety (as indicated) on admission and as needed Assess self care needs on admission and as needed Notes: ` Nutrition Nursing Diagnoses: Imbalanced nutrition Potential for alteratiion in Nutrition/Potential for imbalanced nutrition Goals: Patient/caregiver agrees to and verbalizes understanding of need to use nutritional supplements and/or vitamins as prescribed Date Initiated: 10/23/2017 Target Resolution Date: 02/17/2018 Goal Status: Active Interventions: Assess patient nutrition upon admission and as needed per policy Notes: ` Orientation to the Wound Care Program Nursing Diagnoses: [name], [name] ([id number]) Knowledge deficit related to the wound healing center program Goals: Patient/caregiver will verbalize understanding of the [place] Program Date Initiated: 10/23/2017 Target Resolution Date: 11/18/2017 Goal Status: Active Interventions: Provide education on orientation to the wound center Notes: ` Pain, Acute or Chronic Nursing Diagnoses: Pain, acute or chronic: actual or potential Potential alteration in comfort, pain Goals: Patient/caregiver will verbalize adequate pain control between visits Date Initiated: 10/23/2017 Target Resolution Date: 02/17/2018 Goal Status: Active Interventions: Complete pain assessment as per visit requirements Notes: ` Wound/Skin Impairment Nursing Diagnoses: Impaired tissue integrity Knowledge deficit related to ulceration/compromised skin integrity Goals: Ulcer/skin breakdown will have a volume reduction of 80% by week 12 Date Initiated: 10/23/2017 Target Resolution Date: 02/17/2018 Goal  Status: Active Interventions: Assess patient/caregiver ability  to perform ulcer/skin care regimen upon admission and as needed Assess ulceration(s) every visit Notes: Electronic Signature(s) Signed: 10/23/2017 5:03:00 PM By: Alric Quan Entered By: Alric Quan on 10/23/2017 15:13:08 [name] ([id number]) -------------------------------------------------------------------------------- Pain Assessment Details Patient Name: [name] Date of Service: 10/23/2017 2:15 PM Medical Record Number: [id number] Patient Account Number: [id number] Date of Birth/Sex: 10-20-1918 (84 y.o. Female) Treating RN: Carolyne Fiscal, Debi Primary Care Breuna Loveall: PATIENT, NO Other Clinician: Referring Rigby Swamy: Referral, Self Treating Gussie Towson/Extender: STONE III, HOYT Weeks in Treatment: 0 Active Problems Location of Pain Severity and Description of Pain Patient Has Paino Yes Site Locations Pain Location: Pain in Ulcers Rate the pain. Current Pain Level: 5 Character of Pain Describe the Pain: Aching Pain Management and Medication Current Pain Management: Electronic Signature(s) Signed: 10/23/2017 5:03:00 PM By: Alric Quan Entered By: Alric Quan on 10/23/2017 14:42:13 [name] ([id number]) -------------------------------------------------------------------------------- Patient/Caregiver Education Details Patient Name: [name] Date of Service: 10/23/2017 2:15 PM Medical Record Number: [id number] Patient Account Number: [id number] Date of Birth/Gender: 01/11/1919 (84 y.o. Female) Treating RN: Carolyne Fiscal, Debi Primary Care Physician: PATIENT, NO Other Clinician: Referring Physician: Referral, Self Treating Physician/Extender: STONE III, HOYT Weeks in Treatment: 0 Education Assessment Education Provided To: Patient and Caregiver daughter Education Topics Provided Nutrition: Handouts: Nutrition Methods: Explain/Verbal Responses: State content  correctly Welcome To [facility] [place]: Handouts: Welcome To [facility] Methods: Explain/Verbal Responses: State content correctly Wound/Skin Impairment: Handouts: Caring for Your Ulcer, Other: change dressing as ordered Methods: Demonstration, Explain/Verbal Responses: State content correctly Electronic Signature(s) Signed: 10/23/2017 5:03:00 PM By: Alric Quan Entered By: Alric Quan on 10/23/2017 15:14:22 [name] ([id number]) -------------------------------------------------------------------------------- Wound Assessment Details Patient Name: [name] Date of Service: 10/23/2017 2:15 PM Medical Record Number: [id number] Patient Account Number: [id number] Date of Birth/Sex: May 16, 1919 (84 y.o. Female) Treating RN: Carolyne Fiscal, Debi Primary Care Olamae Ferrara: PATIENT, NO Other Clinician: Referring Aima Mcwhirt: Referral, Self Treating Shanekqua Schaper/Extender: STONE III, HOYT Weeks in Treatment: 0 Wound Status Wound Number: 6 Primary Etiology: Arterial Insufficiency Ulcer Wound Location: Right Toe Great - Medial Wound Status: Open Wounding Event: Gradually Appeared Comorbid History: Cataracts, Hypertension, Osteoarthritis Date Acquired: 10/16/2017 Weeks Of Treatment: 0 Clustered Wound: No Photos Photo Uploaded By: Alric Quan on 10/24/2017 14:39:49 Wound Measurements Length: (cm) 1 Width: (cm) 0.2 Depth: (cm) 0.1 Area: (cm) 0.157 Volume: (cm) 0.016 % Reduction in Area: 0% % Reduction in Volume: 0% Epithelialization: None Tunneling: No Undermining: No Wound Description Classification: Partial Thickness Wound Margin: Distinct, outline attached Exudate Amount: Large Exudate Type: Serous Exudate Color: amber Foul Odor After Cleansing: No Slough/Fibrino No Wound Bed Granulation Amount: Large (67-100%) Exposed Structure Granulation Quality: Red Fascia Exposed: No Necrotic Amount: None Present (0%) Fat Layer (Subcutaneous Tissue)  Exposed: No Tendon Exposed: No Muscle Exposed: No Joint Exposed: No Bone Exposed: No Periwound Skin Texture [name], [name] ([id number]) Texture Color No Abnormalities Noted: No No Abnormalities Noted: No Moisture Temperature / Pain No Abnormalities Noted: No Temperature: No Abnormality Tenderness on Palpation: Yes Wound Preparation Ulcer Cleansing: Rinsed/Irrigated with Saline Topical Anesthetic Applied: Other: lidocaine 4%, Treatment Notes Wound #6 (Left, Medial Toe Great) 1. Cleansed with: Clean wound with Normal Saline 2. Anesthetic Topical Lidocaine 4% cream to wound bed prior to debridement 4. Dressing Applied: Santyl Ointment 5. Secondary Dressing Applied Dry Gauze Notes coverlet Electronic Signature(s) Signed: 10/24/2017 10:28:24 AM By: Alric Quan Previous Signature: 10/23/2017 5:03:00 PM Version By: Alric Quan Entered By: Alric Quan on 10/24/2017 10:28:23 [name] ([id number]) -------------------------------------------------------------------------------- Wound Assessment Details Patient Name: [name] Date  of Service: 10/23/2017 2:15 PM Medical Record Number: [id number] Patient Account Number: [id number] Date of Birth/Sex: 17-Nov-1918 (84 y.o. Female) Treating RN: Carolyne Fiscal, Debi Primary Care Neve Branscomb: PATIENT, NO Other Clinician: Referring Loreen Bankson: Referral, Self Treating Wen Munford/Extender: STONE III, HOYT Weeks in Treatment: 0 Wound Status Wound Number: 7 Primary Etiology: Arterial Insufficiency Ulcer Wound Location: Right Foot - Dorsal Wound Status: Open Wounding Event: Gradually Appeared Comorbid History: Cataracts, Hypertension, Osteoarthritis Date Acquired: 10/11/2017 Weeks Of Treatment: 0 Clustered Wound: No Photos Photo Uploaded By: Alric Quan on 10/24/2017 14:39:49 Wound Measurements Length: (cm) 0.8 Width: (cm) 0.9 Depth: (cm) 0.2 Area: (cm) 0.565 Volume: (cm) 0.113 % Reduction in Area: 0% %  Reduction in Volume: 0% Epithelialization: None Tunneling: No Undermining: No Wound Description Classification: Partial Thickness Wound Margin: Distinct, outline attached Exudate Amount: Large Exudate Type: Serous Exudate Color: amber Foul Odor After Cleansing: No Slough/Fibrino Yes Wound Bed Granulation Amount: Small (1-33%) Exposed Structure Granulation Quality: Pink Fascia Exposed: No Necrotic Amount: Large (67-100%) Fat Layer (Subcutaneous Tissue) Exposed: No Necrotic Quality: Adherent Slough Tendon Exposed: No Muscle Exposed: No Joint Exposed: No Bone Exposed: No Periwound Skin Texture [name], [name] ([id number]) Texture Color No Abnormalities Noted: No No Abnormalities Noted: No Moisture Temperature / Pain No Abnormalities Noted: No Temperature: No Abnormality Tenderness on Palpation: Yes Wound Preparation Ulcer Cleansing: Rinsed/Irrigated with Saline Topical Anesthetic Applied: Other: lidocaine 4%, Treatment Notes Wound #7 (Left, Dorsal Foot) 1. Cleansed with: Clean wound with Normal Saline 2. Anesthetic Topical Lidocaine 4% cream to wound bed prior to debridement 4. Dressing Applied: Santyl Ointment 5. Secondary Dressing Applied Dry Gauze Notes coverlet Electronic Signature(s) Signed: 10/24/2017 10:28:38 AM By: Alric Quan Previous Signature: 10/23/2017 5:03:00 PM Version By: Alric Quan Entered By: Alric Quan on 10/24/2017 10:28:38 [name] ([id number]) -------------------------------------------------------------------------------- [place] Details Patient Name: [name] Date of Service: 10/23/2017 2:15 PM Medical Record Number: [id number] Patient Account Number: [id number] Date of Birth/Sex: February 03, 1919 (84 y.o. Female) Treating RN: Carolyne Fiscal, Debi Primary Care Claudis Giovanelli: PATIENT, NO Other Clinician: Referring Lilie Vezina: Referral, Self Treating Immaculate Crutcher/Extender: STONE III, HOYT Weeks in Treatment: 0 Vital Signs Time  Taken: 14:42 Temperature (F): 98.2 Pulse (bpm): 74 Respiratory Rate (breaths/min): 16 Blood Pressure (mmHg): 169/56 Reference Range: 80 - 120 mg / dl Electronic Signature(s) Signed: 10/23/2017 5:03:00 PM By: Alric Quan Entered By: Alric Quan on 10/23/2017 14:45:48

## 2017-10-30 ENCOUNTER — Encounter: Payer: Medicare PPO | Admitting: Physician Assistant

## 2017-10-30 DIAGNOSIS — I70235 Atherosclerosis of native arteries of right leg with ulceration of other part of foot: Secondary | ICD-10-CM | POA: Diagnosis not present

## 2017-10-31 NOTE — Progress Notes (Addendum)
PEMA, THOMURE ([id number]) Visit Report for 10/30/2017 Chief Complaint Document Details Patient Name: [name], [name] Date of Service: 10/30/2017 8:45 AM Medical Record Number: [id number] Patient Account Number: [id number] Date of Birth/Sex: [birth year] (84 y.o. Female) Treating RN: Carolyne Fiscal, Debi Primary Care Provider: PATIENT, NO Other Clinician: Referring Provider: Referral, Self Treating Provider/Extender: STONE III, Ota Ebersole Weeks in Treatment: 1 Information Obtained from: Patient Chief Complaint Right foot Ulcers Electronic Signature(s) Signed: 10/30/2017 4:26:52 PM By: Worthy Keeler PA-C Entered By: Worthy Keeler on 10/30/2017 09:29:20 [name] ([id number]) -------------------------------------------------------------------------------- HPI Details Patient Name: [name] Date of Service: 10/30/2017 8:45 AM Medical Record Number: [id number] Patient Account Number: [id number] Date of Birth/Sex: 05/10/1919 (84 y.o. Female) Treating RN: Carolyne Fiscal, Debi Primary Care Provider: PATIENT, NO Other Clinician: Referring Provider: Referral, Self Treating Provider/Extender: STONE III, Ruqayya Ventress Weeks in Treatment: 1 History of Present Illness HPI Description: 04/06/16; this is a 84 year old woman who arrives accompanied by 2 daughters for a wound on her left ankle and her left fifth toe. These have apparently been present for a year. I'm not quite certain how she came to this clinic however she was being followed by Sharlotte Alamo her podiatrist for these wounds. She was also referred to [facility] and they apparently did a test presumably arterial studies although we don't have any of these results and we couldn't get through to the office today. The family but has been applying a combination of Bactroban and a light bandage and perhaps more recently Silvadene cream. She did have an x-ray of the foot roughly 6 months ago at the podiatry office the family was unaware  that if there were any abnormalities. Apparently they have not seen any healing here. Our intake nurse noted a slight skin tear on the right anterior lower leg. Nobody seemed aware of this. ABIs calculated in this clinic was 0.3 on the right and 0.4 on the left I have reviewed things [facility]. There is very little information on this patient. She apparently follows in current total clinic which we don't have information from. She has mentioned already been to a [facility]. She has a history of hypothyroidism, nephrolithiasis arthritis and has had a previous mastectomy. 04/13/16; patient's x-ray was normal. She is already been to see Dr. Delana Meyer vascular surgery. Her arterial exam was from November 2016 this showed a left ABI of 0.62 her right of 0.76. Her duplex ultrasound of the left leg showed biphasic waves in the common femoral and distal femoral artery however monophasic waves in the superficial femoral artery proximal and mid biphasic it distal. 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 The patient tells me that she has pain at night when she tries to lie down which is improved by getting up and sitting in the chair this sounds like claudication at rest. Her wounds are on the left medial malleolus and the dorsal fifth toe small punched out wounds that are right on bone. We use Santyl last week 04/20/16: nurse informed me pt has declined evaluation for significant PAD. she denies systemic s/s of infections. 04/27/16;; the patient has had noninvasive arterial studies done in November 2016. ABI and the left was 0.62. Monophasic waves at the superficial femoral artery. Occluded to the posterior tibial artery. So had greater than 50% stenosis of the right superficial femoral and greater than 50% stenosis of the left superficial femoral artery. She had bilateral tibial peroneal artery disease. Both of the wounds on the left fifth toe and left lateral malleolus have  been present for more than a year.  They have been to see vein and vascular. The patient has some pain but miraculously I think the wounds have largely been stable. No evidence of infection 05/04/16; she goes for a noninvasive study tomorrow and then sees Dr. Fletcher Anon on Monday. By the time she is here next week we should have a better picture of whether something can be done with regards to her arterial flow. We continue to have ischemic-looking wounds on the left fifth toe and left lateral malleolus. 05/10/16; the patient went for her arterial studies and saw Dr. Fletcher Anon. As predicted she is felt to have critical limb ischemia. The feeling is that she has occlusion of the SFA. The feeling would she would be a candidate for a stent to the SFA. The patient did not make a decision to proceed with the procedure and she is here with family members to discuss this me today. He shouldn't has a lot of pain and cannot sleep and rest well at night per her family. 05/24/16; the patient went and had a complex revascularization/angioplasty of the left superficial femoral artery followed by drug-coated balloon angioplasty and spots stenting. She tolerated the procedure well. She was recommended for dual antiplatelet drugs with Plavix and aspirin for at least a month. She went yesterday for I believe follow-up serial Dopplers and ABIs although I don't see these results. The patient is unfortunately complaining of a lot of pain in her bilateral lower legs below the knees from the ankle to the knees. She apparently was prescribed lidocaine and apparently put this on her legs instead of over the wound areas. This may have something to do with it however there is a lot of edema in her bilateral legs I was able to find her arterial studies from yesterday. The left ABI has improved up to 0.66 post left SFA stent. The bilateral great toe indices remain abnormal with the left being in the 0.25 range. Duplex ultrasound showed her left SFA stent is  patent monophasic waveforms persist in the left leg 06/07/16; continued punched-out areas over the dorsal left fifth toe and left lateral malleolus. No major improvement 06/28/16; the areas over her dorsal left fifth toe and left lateral malleolus are covered in surface slough we are using Iodoflex 07/05/16. We have been using Iodoflex for 2-3 weeks now. I have not been debridement is because of pain ANAIS, [name] ([id number]) 07/19/16 currently we have been using Iodoflex for roughly the past month. Previously we were unable to debride due to pain although today patient states that the pain is not nearly as severe as it has been in the past. In fact she rates this to be a 1 out of 10 and it worse to a to 10 with palpation of the wound. All and all her and her daughter feel like this is actually doing steadily better at this point in time. She is pleased with progress and we have been seeing her every 2 weeks. 08/02/16; this is a delightful 84 year old woman I have not seen in over a month. She has arterial insufficiency wounds remaining over the left lateral malleolus and the left fifth toe. With the help of Dr. Fletcher Anon we are able to get her revascularized on the left. The 2 wounds on the left have been making good progress and are definitely smaller especially the area over the left lateral malleolus. She is certainly in a lot less pain than she used to be although I still  think there is some claudication type pain. Unfortunately she is developed a area on the right lateral foot which is a small open area but I think is threatened. I suspect a small ischemic areas well. Also on her right fifth toe there is an area that is not open but looks as though it is receiving too much pressure from footwear. Finally an area over her right malleolus although I don't think this is on its way to anything ominous 08/17/16 patient presents today for follow up evaluation she tells me that she is really doing  fairly well from a pain standpoint compared to where she has been previous. She is tolerating the dressing changes without any complication. She continues to have discharge and drainage however. 08-30-16 Ms. Kowalczyk presents today with her daughter, she states that she continues to have intermittent shooting pains to the left lateral fifth toe at this site of seems to be a healed ulcer. She denies any other issues that her wound related since her last appointment. Her daughter states that she is in need of a tramadol refill at this time as she uses half a tablet at at bedtime to aid in sleep due to foot pain. Iodoflex has been used on all wounds in previous dressing changes. 09/13/16; the patient has ischemic wounds in her feet. The area over the left lateral malleolus is healed and the area over the left fifth toe looks improved.. She has an area on the lateral aspect of her right foot which is a small but deep wound. Finally she has a new open area on the medial aspect of the left medial malleolus. This is superficial 09/27/16; the area over the left lateral malleolus remains healed. The area over the left fifth toe has a surface and she states the pain is better but I don't think this is completely closed. She has an area on the lateral aspect of her right foot is a small but deep wound. The new open area on the medial aspect of the left ankle was closed from last time. 10/18/16; the area over her left lateral malleolus remains healed. The area over the left fifth toe has a surface over the top of this however there is no overt open area. Given the underlying issues of severe PAD and continued pain in the toe I would think it would be unlikely this is truly healed however I am not planning to debridement this area. The area on the right lateral foot which was a more recent wound is a small punched out painful area again has significant surface slough and nonviable tissue. It is clear the patient  still has claudication type pain however she remains functional. I have been giving her tramadol when necessary and that seems to help a lot with her pain the patient follows up with Dr. Fletcher Anon on January 18/18 11/01/16; patient missed her follow-up with Dr. Fletcher Anon last week due to a snow day. Follow-up is now on February 15. The areas on the right lateral malleolus and dorsal right fifth toe remain closed over. The fifth toe was tentative is there is a surface eschar however I'm not going to disturb this. Therefore, her only open areas on the right lateral foot. This is a small punched out area. We have been using Prisma. I'm quite convinced this is an ischemic wound 11/15/16; patient has follow-up with Dr. Fletcher Anon on February 15. She has no open wound on the left leg and doesn't really complain of claudication that  I can determine from talking to her. However on the right leg she clearly has some degree of claudication. The remaining open wound is on the right lateral foot. We have been using Iodosorb ointment 11/29/16; patient saw Dr. Fletcher Anon on 11/24/16. His comment is that her wound on her right lateral foot seems to be improving with local wound care. She has known significant right SFA disease. Her lower extremity Doppler will be repeated and according to the patient's daughter that appointment is on March 15. He is left with the thought that endovascular intervention in the right SFA might be necessary. The patient has quite a bit of pain especially at night related to the wound in her right foot. We changed her to Iodosorb ointment last week 12/13/16; this is a patient I follow every 2 weeks largely on a palliative approach at this point about ischemic wounds currently in the right foot. Initially she had them on her left lateral malleolus and left fifth toe. The area on the left lateral malleolus healed and the fifth toe has a surface eschar that I have elected not to remove. Both of these improved  after revascularization by Dr. Fletcher Anon. We have been using Iodosorb to the right lateral foot not much change here. 12/27/16; the patient had her arterial studies. This showed known bilateral SFA disease. Stable right ABI 0.5 to stable left ABI at 0.7 to the did not do it TBI on the right it was 0.61 on the left she has a stent in the long segment of her left SFA from 05/18/16. All of her wounds are somewhat better. She states her pain is better in the right foot is improved. 01/10/17- patient is here for follow-up dilation of her right lateral foot ulcer. She has been tolerating Iodosorb. She is voices no complaints or concerns 01/24/17; small ischemic wound on right lateral foot. still non viable cover. follows with Dr. Fletcher Anon on 4/30. No open area on left foot Iseminger, [name] ([id number]) 02/07/17 doing well and states she is pain free. Saw Dr. Fletcher Anon who said she is doing well and has "50% flow in the right leg and 70% in the left. According to her daughter he does not wish to do any further interventions 02/21/17; small ischemic wound on the right lateral foot. Per her daughter and the patient she has no open wound on the left foot and ankle which were her initial presenting wounds. still has claudication type pain at night she is been to see interventional cardiology who does not feel that she has a need for intervention on the right leg at present. She still has claudication type pain in the right leg which she manages at night with when necessary tramadol that I have prescribed 03/14/17; small ischemic wound on the right lateral foot. They've been dressing this at home with Iodosorb ointment. The patient does not complain of any pain. The wound has a surface eschar over it and there is really no visible open area. This is similar to how the areas on the left leg healed 04/11/17; the small ischemic wound on her right lateral foot is finally closed over. Small amount of eschar over the surface however I did  not attempt to remove this. The patient claims to be asymptomatic she is not having any claudication that I'm able to elicit although her activity is limited Readmission: 10/23/17 on evaluation today patient appears to be doing somewhat poorly in regard to her right foot where she has two ulcers at  this point. The worst is on the lateral aspect of her foot medial first metatarsal region is not nearly as bad. With that being said these did arise seemingly out of nowhere she has previously had a similar issue in the past these have always been ischemic wounds due to arterial insufficiency. She does see Dr. Fletcher Anon as her vascular specialist and it has been noted that her ABI's are somewhat low. She actually has a repeat evaluation coming up this March 2019 to reevaluate her blood flow. On the last check 12/22/16 it was noted that patient had stable ABI's in the lower moderate abnormal range in regard to the right in regard to the left and the upper moderate at normal range. Patient is having some discomfort though nothing severe at this point in time. She is seen with her daughter and son-in-law today. No fevers, chills, nausea, or vomiting noted at this time. 10/30/17 on evaluation today patient appears to be doing well in regard to her wounds. The right lateral foot wound appears to show signs of cleaning up nicely there is granulation noted underneath that the bed of the wound although there still some [facility] covering. Nonetheless she still has a lot of discomfort and I really do not want to proceed with debridement due to the fact that she does have so much discomfort. Good news is the tramadol has been helping her at bedtime she only takes this once a day and that has been extremely beneficial. Unfortunately she does not have a primary right now as she is awaiting a new one which is the reason that I am prescribing the tramadol. Nonetheless I'm pleased with how things seem to be progressing in regard  to her ulcers. Electronic Signature(s) Signed: 10/30/2017 4:26:52 PM By: Worthy Keeler PA-C Entered By: Worthy Keeler on 10/30/2017 09:29:38 [name] ([id number]) -------------------------------------------------------------------------------- Physical Exam Details Patient Name: [name] Date of Service: 10/30/2017 8:45 AM Medical Record Number: [id number] Patient Account Number: [id number] Date of Birth/Sex: 17-Nov-1918 (84 y.o. Female) Treating RN: Carolyne Fiscal, Debi Primary Care Provider: PATIENT, NO Other Clinician: Referring Provider: Referral, Self Treating Provider/Extender: STONE III, Bindu Docter Weeks in Treatment: 1 Constitutional Well-nourished and well-hydrated in no acute distress. Respiratory normal breathing without difficulty. clear to auscultation bilaterally. Cardiovascular regular rate and rhythm with normal S1, S2. Psychiatric Patient is not able to cooperate in decision making regarding care. Patient is oriented to person and place. pleasant and cooperative. Notes Patient's wound on evaluation today in regard to the lateral foot shows that there is still some slough overlying the wound bed fortunately there does not appear to be any evidence of infection and she does have discomfort although this is not terribly significant. No debridement was performed today due to discomfort and family/patient preference. Electronic Signature(s) Signed: 10/30/2017 4:26:52 PM By: Worthy Keeler PA-C Entered By: Worthy Keeler on 10/30/2017 09:30:45 [name] ([id number]) -------------------------------------------------------------------------------- Physician Orders Details Patient Name: [name] Date of Service: 10/30/2017 8:45 AM Medical Record Number: [id number] Patient Account Number: [id number] Date of Birth/Sex: [birth year] (84 y.o. Female) Treating RN: Carolyne Fiscal, Debi Primary Care Provider: PATIENT, NO Other Clinician: Referring Provider: Referral,  Self Treating Provider/Extender: STONE III, Elmon Shader Weeks in Treatment: 1 Verbal / Phone Orders: Yes Clinician: Pinkerton, Debi Read Back and Verified: Yes Diagnosis Coding Wound Cleansing Wound #6 Right,Medial Toe Great o Clean wound with Normal Saline. o Cleanse wound with mild soap and water o May Shower, gently pat wound dry prior to applying new dressing. Wound #  7 Right,Dorsal Foot o Clean wound with Normal Saline. o Cleanse wound with mild soap and water o May Shower, gently pat wound dry prior to applying new dressing. Anesthetic (add to Medication List) Wound #6 Right,Medial Toe Great o Topical Lidocaine 4% cream applied to wound bed prior to debridement (In Clinic Only). Wound #7 Right,Dorsal Foot o Topical Lidocaine 4% cream applied to wound bed prior to debridement (In Clinic Only). Skin Barriers/Peri-Wound Care Wound #7 Right,Dorsal Foot o Skin Prep Primary Wound Dressing Wound #6 Right,Medial Toe Great o Prisma Ag Wound #7 Right,Dorsal Foot o Santyl Ointment Secondary Dressing Wound #6 Right,Medial Toe Great o Other - coverlet Wound #7 Right,Dorsal Foot o Dry Gauze o Tegaderm Dressing Change Frequency Wound #6 Right,Medial Toe Great o Change dressing every day. Wound #7 Right,Dorsal Foot o Change dressing every day. [name], [name] ([id number]) Follow-up Appointments Wound #6 Right,Medial Toe Great o Return Appointment in 1 week. Wound #7 Right,Dorsal Foot o Return Appointment in 1 week. Edema Control Wound #6 Right,Medial Toe Great o Elevate legs to the level of the heart and pump ankles as often as possible Wound #7 Right,Dorsal Foot o Elevate legs to the level of the heart and pump ankles as often as possible Off-Loading Wound #6 Right,Medial Toe Great o Turn and reposition every 2 hours Wound #7 Right,Dorsal Foot o Turn and reposition every 2 hours Additional Orders / Instructions Wound #6 Right,Medial  Toe Great o Increase protein intake. o Other: - Vitamin C, Zinc Wound #7 Right,Dorsal Foot o Increase protein intake. o Other: - Vitamin C, Zinc Patient Medications Allergies: codeine Notifications Medication Indication Start End lidocaine DOSE 1 - topical 4 % cream - 1 cream topical Electronic Signature(s) Signed: 10/30/2017 4:26:52 PM By: Worthy Keeler PA-C Signed: 10/31/2017 9:12:55 AM By: Alric Quan Entered By: Alric Quan on 10/30/2017 09:40:55 [name] ([id number]) -------------------------------------------------------------------------------- Prescription 10/30/2017 Patient Name: [name] Provider: Worthy Keeler PA-C Date of Birth: January 19, 1919 NPI#: [id number] Sex: F DEA#: [id number] Phone #: [phone] License #: Patient Address: [facility] [place], [place] [address] [address] [place], [place] [address] [phone] Allergies codeine Reaction: sick on stomach Severity: Severe Medication Medication: Route: Strength: Form: lidocaine 4 % topical cream topical 4% cream Class: TOPICAL LOCAL ANESTHETICS Dose: Frequency / Time: Indication: 1 1 cream topical Number of Refills: Number of Units: 0 Generic Substitution: Start Date: End Date: One Time Use: Substitution Permitted No Note to Pharmacy: Signature(s): Date(s): Electronic Signature(s) Signed: 10/30/2017 4:26:52 PM By: Worthy Keeler PA-C Signed: 10/31/2017 9:12:55 AM By: Alric Quan Entered By: Alric Quan on 10/30/2017 09:40:56 [name] ([id number]) [name] ([id number]) --------------------------------------------------------------------------------  Problem List Details Patient Name: [name] Date of Service: 10/30/2017 8:45 AM Medical Record Number: [id number] Patient Account Number: [id number] Date of Birth/Sex: February 05, 1919 (84 y.o.  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 10/23/2017 Yes part of foot I70.232 Atherosclerosis of native arteries of right leg with ulceration of calf 10/23/2017 Yes L97.512 Non-pressure chronic ulcer of other part of right foot with fat layer 10/23/2017 Yes exposed [place] (primary) hypertension 10/24/2017 Yes Inactive Problems Resolved Problems Electronic Signature(s) Signed: 10/30/2017 4:26:52 PM By: Worthy Keeler PA-C Entered By: Worthy Keeler  on 10/30/2017 09:29:14 [name], [name] ([id number]) -------------------------------------------------------------------------------- Progress Note Details Patient Name: [name], [name] Date of Service: 10/30/2017 8:45 AM Medical Record Number: [id number] Patient Account Number: [id number] Date of Birth/Sex: Jan 23, 1919 (84 y.o. Female) Treating RN: Carolyne Fiscal, Debi Primary Care Provider: PATIENT, NO Other Clinician: Referring Provider: Referral, Self Treating Provider/Extender: STONE III, Rahi Chandonnet Weeks in Treatment: 1 Subjective Chief Complaint Information obtained from Patient Right foot Ulcers History of Present Illness (HPI) 04/06/16; this is a 84 year old woman who arrives accompanied by 2 daughters for a wound on her left ankle and her left fifth toe. These have apparently been present for a year. I'm not quite certain how she came to this clinic however she was being followed by Sharlotte Alamo her podiatrist for these wounds. She was also referred to [facility] and they apparently did a test presumably arterial studies although we don't have any of these results and we couldn't get through to the office today. The family  but has been applying a combination of Bactroban and a light bandage and perhaps more recently Silvadene cream. She did have an x-ray of the foot roughly 6 months ago at the podiatry office the family was unaware that if there were any abnormalities. Apparently they have not seen any healing here. Our intake nurse noted a slight skin tear on the right anterior lower leg. Nobody seemed aware of this. ABIs calculated in this clinic was 0.3 on the right and 0.4 on the left I have reviewed things in [facility]. There is very little information on this patient. She apparently follows in current total clinic which we don't have information from. She has mentioned already been to a [facility]. She has a history of hypothyroidism, nephrolithiasis arthritis and has had a previous mastectomy. 04/13/16; patient's x-ray was normal. She is already been to see Dr. Delana Meyer vascular surgery. Her arterial exam was from November 2016 this showed a left ABI of 0.62 her right of 0.76. Her duplex ultrasound of the left leg showed biphasic waves in the common femoral and distal femoral artery however monophasic waves in the superficial femoral artery proximal and mid biphasic it distal. 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 The patient tells me that she has pain at night when she tries to lie down which is improved by getting up and sitting in the chair this sounds like claudication at rest. Her wounds are on the left medial malleolus and the dorsal fifth toe small punched out wounds that are right on bone. We use Santyl last week 04/20/16: nurse informed me pt has declined evaluation for significant PAD. she denies systemic s/s of infections. 04/27/16;; the patient has had noninvasive arterial studies done in November 2016. ABI and the left was 0.62. Monophasic waves at the superficial femoral artery. Occluded to the posterior tibial artery. So had greater than 50% stenosis of the right superficial femoral and greater  than 50% stenosis of the left superficial femoral artery. She had bilateral tibial peroneal artery disease. Both of the wounds on the left fifth toe and left lateral malleolus have been present for more than a year. They have been to see vein and vascular. The patient has some pain but miraculously I think the wounds have largely been stable. No evidence of infection 05/04/16; she goes for a noninvasive study tomorrow and then sees Dr. Fletcher Anon on Monday. By the time she is here next week we should have a better picture of whether something can be done with regards to her arterial  flow. We continue to have ischemic-looking wounds on the left fifth toe and left lateral malleolus. 05/10/16; the patient went for her arterial studies and saw Dr. Fletcher Anon. As predicted she is felt to have critical limb ischemia. The feeling is that she has occlusion of the SFA. The feeling would she would be a candidate for a stent to the SFA. The patient did not make a decision to proceed with the procedure and she is here with family members to discuss this me today. He shouldn't has a lot of pain and cannot sleep and rest well at night per her family. 05/24/16; the patient went and had a complex revascularization/angioplasty of the left superficial femoral artery followed by drug-coated balloon angioplasty and spots stenting. She tolerated the procedure well. She was recommended for dual antiplatelet drugs with Plavix and aspirin for at least a month. She went yesterday for I believe follow-up serial Dopplers and ABIs although I don't see these results. The patient is unfortunately complaining of a lot of pain in her bilateral lower legs below the knees from the ankle to the knees. She apparently was prescribed lidocaine and apparently put this on her legs instead of over the wound areas. This may have something to do with it however there is a lot of edema in her bilateral legs I was able to find her arterial studies from  yesterday. The left ABI has improved up to 0.66 post left SFA stent. The bilateral [name], [name] ([id number]) great toe indices remain abnormal with the left being in the 0.25 range. Duplex ultrasound showed her left SFA stent is patent monophasic waveforms persist in the left leg 06/07/16; continued punched-out areas over the dorsal left fifth toe and left lateral malleolus. No major improvement 06/28/16; the areas over her dorsal left fifth toe and left lateral malleolus are covered in surface slough we are using Iodoflex 07/05/16. We have been using Iodoflex for 2-3 weeks now. I have not been debridement is because of pain 07/19/16 currently we have been using Iodoflex for roughly the past month. Previously we were unable to debride due to pain although today patient states that the pain is not nearly as severe as it has been in the past. In fact she rates this to be a 1 out of 10 and it worse to a to 10 with palpation of the wound. All and all her and her daughter feel like this is actually doing steadily better at this point in time. She is pleased with progress and we have been seeing her every 2 weeks. 08/02/16; this is a delightful 84 year old woman I have not seen in over a month. She has arterial insufficiency wounds remaining over the left lateral malleolus and the left fifth toe. With the help of Dr. Fletcher Anon we are able to get her revascularized on the left. The 2 wounds on the left have been making good progress and are definitely smaller especially the area over the left lateral malleolus. She is certainly in a lot less pain than she used to be although I still think there is some claudication type pain. Unfortunately she is developed a area on the right lateral foot which is a small open area but I think is threatened. I suspect a small ischemic areas well. Also on her right fifth toe there is an area that is not open but looks as though it is receiving too much pressure from footwear.  Finally an area over her right malleolus although I don't think this  is on its way to anything ominous 08/17/16 patient presents today for follow up evaluation she tells me that she is really doing fairly well from a pain standpoint compared to where she has been previous. She is tolerating the dressing changes without any complication. She continues to have discharge and drainage however. 08-30-16 Ms. Radcliffe presents today with her daughter, she states that she continues to have intermittent shooting pains to the left lateral fifth toe at this site of seems to be a healed ulcer. She denies any other issues that her wound related since her last appointment. Her daughter states that she is in need of a tramadol refill at this time as she uses half a tablet at at bedtime to aid in sleep due to foot pain. Iodoflex has been used on all wounds in previous dressing changes. 09/13/16; the patient has ischemic wounds in her feet. The area over the left lateral malleolus is healed and the area over the left fifth toe looks improved.. She has an area on the lateral aspect of her right foot which is a small but deep wound. Finally she has a new open area on the medial aspect of the left medial malleolus. This is superficial 09/27/16; the area over the left lateral malleolus remains healed. The area over the left fifth toe has a surface and she states the pain is better but I don't think this is completely closed. She has an area on the lateral aspect of her right foot is a small but deep wound. The new open area on the medial aspect of the left ankle was closed from last time. 10/18/16; the area over her left lateral malleolus remains healed. The area over the left fifth toe has a surface over the top of this however there is no overt open area. Given the underlying issues of severe PAD and continued pain in the toe I would think it would be unlikely this is truly healed however I am not planning to debridement  this area. The area on the right lateral foot which was a more recent wound is a small punched out painful area again has significant surface slough and nonviable tissue. It is clear the patient still has claudication type pain however she remains functional. I have been giving her tramadol when necessary and that seems to help a lot with her pain the patient follows up with Dr. Fletcher Anon on January 18/18 11/01/16; patient missed her follow-up with Dr. Fletcher Anon last week due to a snow day. Follow-up is now on February 15. The areas on the right lateral malleolus and dorsal right fifth toe remain closed over. The fifth toe was tentative is there is a surface eschar however I'm not going to disturb this. Therefore, her only open areas on the right lateral foot. This is a small punched out area. We have been using Prisma. I'm quite convinced this is an ischemic wound 11/15/16; patient has follow-up with Dr. Fletcher Anon on February 15. She has no open wound on the left leg and doesn't really complain of claudication that I can determine from talking to her. However on the right leg she clearly has some degree of claudication. The remaining open wound is on the right lateral foot. We have been using Iodosorb ointment 11/29/16; patient saw Dr. Fletcher Anon on 11/24/16. His comment is that her wound on her right lateral foot seems to be improving with local wound care. She has known significant right SFA disease. Her lower extremity Doppler will be repeated  and according to the patient's daughter that appointment is on March 15. He is left with the thought that endovascular intervention in the right SFA might be necessary. The patient has quite a bit of pain especially at night related to the wound in her right foot. We changed her to Iodosorb ointment last week 12/13/16; this is a patient I follow every 2 weeks largely on a palliative approach at this point about ischemic wounds currently in the right foot. Initially she had them  on her left lateral malleolus and left fifth toe. The area on the left lateral malleolus healed and the fifth toe has a surface eschar that I have elected not to remove. Both of these improved after revascularization by Dr. Fletcher Anon. We have been using Iodosorb to the right lateral foot not much change here. 12/27/16; the patient had her arterial studies. This showed known bilateral SFA disease. Stable right ABI 0.5 to stable left ABI at 0.7 to the did not do it TBI on the right it was 0.61 on the left she has a stent in the long segment of her left SFA from [place], [name] ([id number]) 05/18/16. All of her wounds are somewhat better. She states her pain is better in the right foot is improved. 01/10/17- patient is here for follow-up dilation of her right lateral foot ulcer. She has been tolerating Iodosorb. She is voices no complaints or concerns 01/24/17; small ischemic wound on right lateral foot. still non viable cover. follows with Dr. Fletcher Anon on 4/30. No open area on left foot 02/07/17 doing well and states she is pain free. Saw Dr. Fletcher Anon who said she is doing well and has "50% flow in the right leg and 70% in the left. According to her daughter he does not wish to do any further interventions 02/21/17; small ischemic wound on the right lateral foot. Per her daughter and the patient she has no open wound on the left foot and ankle which were her initial presenting wounds. still has claudication type pain at night she is been to see interventional cardiology who does not feel that she has a need for intervention on the right leg at present. She still has claudication type pain in the right leg which she manages at night with when necessary tramadol that I have prescribed 03/14/17; small ischemic wound on the right lateral foot. They've been dressing this at home with Iodosorb ointment. The patient does not complain of any pain. The wound has a surface eschar over it and there is really no visible open area.  This is similar to how the areas on the left leg healed 04/11/17; the small ischemic wound on her right lateral foot is finally closed over. Small amount of eschar over the surface however I did not attempt to remove this. The patient claims to be asymptomatic she is not having any claudication that I'm able to elicit although her activity is limited Readmission: 10/23/17 on evaluation today patient appears to be doing somewhat poorly in regard to her right foot where she has two ulcers at this point. The worst is on the lateral aspect of her foot medial first metatarsal region is not nearly as bad. With that being said these did arise seemingly out of nowhere she has previously had a similar issue in the past these have always been ischemic wounds due to arterial insufficiency. She does see Dr. Fletcher Anon as her vascular specialist and it has been noted that her ABI's are somewhat low. She actually has  a repeat evaluation coming up this March 2019 to reevaluate her blood flow. On the last check 12/22/16 it was noted that patient had stable ABI's in the lower moderate abnormal range in regard to the right in regard to the left and the upper moderate at normal range. Patient is having some discomfort though nothing severe at this point in time. She is seen with her daughter and son-in-law today. No fevers, chills, nausea, or vomiting noted at this time. 10/30/17 on evaluation today patient appears to be doing well in regard to her wounds. The right lateral foot wound appears to show signs of cleaning up nicely there is granulation noted underneath that the bed of the wound although there still some [facility] covering. Nonetheless she still has a lot of discomfort and I really do not want to proceed with debridement due to the fact that she does have so much discomfort. Good news is the tramadol has been helping her at bedtime she only takes this once a day and that has been extremely beneficial. Unfortunately  she does not have a primary right now as she is awaiting a new one which is the reason that I am prescribing the tramadol. Nonetheless I'm pleased with how things seem to be progressing in regard to her ulcers. Patient History Information obtained from Patient. Family History Diabetes - Mother, Heart Disease - Siblings, Stroke - Siblings, No family history of Cancer, Hereditary Spherocytosis, Hypertension, Kidney Disease, Lung Disease, Seizures, Thyroid Problems, Tuberculosis. Social History Never smoker, Marital Status - Widowed, Alcohol Use - Never, Drug Use - No History, Caffeine Use - Daily. Review of Systems (ROS) Constitutional Symptoms (General Health) Denies complaints or symptoms of Fever, Chills. Respiratory The patient has no complaints or symptoms. Cardiovascular The patient has no complaints or symptoms. Psychiatric The patient has no complaints or symptoms. [name], [name] ([id number]) Objective Constitutional Well-nourished and well-hydrated in no acute distress. Vitals Time Taken: 8:49 AM, Temperature: 98.3 F, Pulse: 76 bpm, Respiratory Rate: 16 breaths/min, Blood Pressure: 144/57 mmHg. Respiratory normal breathing without difficulty. clear to auscultation bilaterally. Cardiovascular regular rate and rhythm with normal S1, S2. Psychiatric Patient is not able to cooperate in decision making regarding care. Patient is oriented to person and place. pleasant and cooperative. General Notes: Patient's wound on evaluation today in regard to the lateral foot shows that there is still some slough overlying the wound bed fortunately there does not appear to be any evidence of infection and she does have discomfort although this is not terribly significant. No debridement was performed today due to discomfort and family/patient preference. Integumentary (Hair, Skin) Wound #6 status is Open. Original cause of wound was Gradually Appeared. The wound is located on the  [employer]. The wound measures 0.6cm length x 0.2cm width x 0.1cm depth; 0.094cm^2 area and 0.009cm^3 volume. There is no tunneling or undermining noted. There is a large amount of serous drainage noted. The wound margin is distinct with the outline attached to the wound base. There is medium (34-66%) red granulation within the wound bed. There is a medium (34- 66%) amount of necrotic tissue within the wound bed including Eschar. Periwound temperature was noted as No Abnormality. The periwound has tenderness on palpation. Wound #7 status is Open. Original cause of wound was Gradually Appeared. The wound is located on the Right,Dorsal Foot. The wound measures 0.8cm length x 0.9cm width x 0.2cm depth; 0.565cm^2 area and 0.113cm^3 volume. There is no tunneling or undermining noted. There is a large amount  of serous drainage noted. The wound margin is distinct with the outline attached to the wound base. There is small (1-33%) pink granulation within the wound bed. There is a large (67-100%) amount of necrotic tissue within the wound bed including Adherent Slough. Periwound temperature was noted as No Abnormality. The periwound has tenderness on palpation. 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 Cleanse wound with mild soap and water May Shower, gently pat wound dry prior to applying new dressing. Wound #7 Right,Dorsal Foot: Clean wound with Normal Saline. Cleanse wound with mild soap and water May Shower, gently pat wound dry prior to applying new dressing. Anesthetic  (add to Medication List): Wound #6 Right,Medial Toe Great: Topical Lidocaine 4% cream applied to wound bed prior to debridement (In Clinic Only). Wound #7 Right,Dorsal Foot: Topical Lidocaine 4% cream applied to wound bed prior to debridement (In Clinic Only). Skin Barriers/Peri-Wound Care: Wound #7 Right,Dorsal Foot: Skin Prep Primary Wound Dressing: Wound #6 Right,Medial Toe Great: Prisma Ag Wound #7 Right,Dorsal Foot: Santyl Ointment Secondary Dressing: Wound #6 Right,Medial Toe Great: Other - coverlet Wound #7 Right,Dorsal Foot: Dry Gauze Tegaderm Dressing Change Frequency: Wound #6 Right,Medial Toe Great: Change dressing every day. Wound #7 Right,Dorsal Foot: Change dressing every day. Follow-up Appointments: Wound #6 Right,Medial Toe Great: Return Appointment in 1 week. Wound #7 Right,Dorsal Foot: Return Appointment in 1 week. Edema Control: Wound #6 Right,Medial Toe Great: Elevate legs to the level of the heart and pump ankles as often as possible Wound #7 Right,Dorsal Foot: Elevate legs to the level of the heart and pump ankles as often as possible Off-Loading: Wound #6 Right,Medial Toe Great: Turn and reposition every 2 hours Wound #7 Right,Dorsal Foot: Turn and reposition every 2 hours Additional Orders / Instructions: Wound #6 Right,Medial Toe Great: Increase protein intake. [name], [name] ([id number]) Other: - Vitamin C, Zinc Wound #7 Right,Dorsal Foot: Increase protein intake. Other: - Vitamin C, Zinc The following medication(s) was prescribed: lidocaine topical 4 % cream 1 1 cream topical was prescribed at facility We will continue with the current wound care measures in regard to the lateral foot wound with the Santyl as I feel like this is an official for her. In regard to the medial first toe wound at the base I'm gonna suggest that we switch to a silver collagen dressing hopefully this could be beneficial with helping to stimulate growth and  closure. We will see were things stand at follow-up. Please see above for specific wound care orders. We will see patient for re-evaluation in 1 week(s) here in the clinic. If anything worsens or changes patient will contact our office for additional recommendations. Electronic Signature(s) Signed: 11/13/2017 4:50:01 PM By: Worthy Keeler PA-C Previous Signature: 10/30/2017 4:26:52 PM Version By: Worthy Keeler PA-C Entered By: Worthy Keeler on 11/13/2017 08:56:54 [name] ([id number]) -------------------------------------------------------------------------------- ROS/PFSH Details Patient Name: [name] Date of Service: 10/30/2017 8:45 AM Medical Record Number: [id number] Patient Account Number: [id number] Date of Birth/Sex: 03-18-1919 (84 y.o. Female) Treating RN: Carolyne Fiscal, Debi Primary Care Provider: PATIENT, NO Other Clinician: Referring Provider: Referral, Self Treating Provider/Extender: STONE III, Malayia Spizzirri Weeks in Treatment: 1  Information Obtained From Patient Wound History Do you currently have one or more open woundso Yes How many open wounds do you currently haveo 1 Approximately how long have you had your woundso 1.5 weeks How have you been treating your wound(s) until nowo bandaide Has your wound(s) ever healed and then re-openedo No Have you had any lab work done in the past montho No Have you tested positive for an antibiotic resistant organism (MRSA, VRE)o No Have you tested positive for osteomyelitis (bone infection)o No Have you had any tests for circulation on your legso Yes Where was the test [facility] Have you had other problems associated with your woundso Swelling Constitutional Symptoms (General Health) Complaints and Symptoms: Negative for: Fever; Chills Eyes Medical History: Positive for: Cataracts - had surgery Respiratory Complaints and Symptoms: No Complaints or Symptoms Cardiovascular Complaints and Symptoms: No Complaints or  Symptoms Medical History: Positive for: Hypertension Musculoskeletal Medical History: Positive for: Osteoarthritis Oncologic Medical History: Negative for: Received Chemotherapy; Received Radiation Psychiatric [name], [name] ([id number]) Complaints and Symptoms: No Complaints or Symptoms HBO Extended History Items Eyes: Cataracts Immunizations Pneumococcal Vaccine: Received Pneumococcal Vaccination: Yes Implantable Devices Family and Social History Cancer: No; Diabetes: Yes - Mother; Heart Disease: Yes - Siblings; Hereditary Spherocytosis: No; Hypertension: No; Kidney Disease: No; Lung Disease: No; Seizures: No; Stroke: Yes - Siblings; Thyroid Problems: No; Tuberculosis: No; Never smoker; Marital Status - Widowed; Alcohol Use: Never; Drug Use: No History; Caffeine Use: Daily; Financial Concerns: No; Food, Clothing or Shelter Needs: No; Support System Lacking: No; Transportation Concerns: No; Advanced Directives: No; Patient does not want information on Advanced Directives; Do not resuscitate: No; Living Will: No; Medical Power of Attorney: No Physician Affirmation I have reviewed and agree with the above information. Electronic Signature(s) Signed: 10/30/2017 4:26:52 PM By: Worthy Keeler PA-C Signed: 10/31/2017 9:12:55 AM By: Alric Quan Entered By: Worthy Keeler on 10/30/2017 09:30:23 [name] ([id number]) -------------------------------------------------------------------------------- SuperBill Details Patient Name: [name] Date of Service: 10/30/2017 Medical Record Number: [id number] Patient Account Number: [id number] Date of Birth/Sex: [birth year] (84 y.o. Female) Treating RN: Carolyne Fiscal, Debi Primary Care Provider: PATIENT, NO Other Clinician: Referring Provider: Referral, Self Treating Provider/Extender: STONE III, Shaila Gilchrest Weeks in Treatment: 1 Diagnosis Coding ICD-10 Codes Code Description I70.235 Atherosclerosis of native arteries of right leg  with ulceration of other part of foot I70.232 Atherosclerosis of native arteries of right leg with ulceration of calf L97.512 Non-pressure chronic ulcer of other part of right foot with fat layer exposed I10 Essential (primary) hypertension Facility Procedures CPT4 Code: [id number] Description: 99213 - WOUND CARE VISIT-LEV 3 EST PT Modifier: Quantity: 1 Physician Procedures CPT4 Code Description: [id number] - WC PHYS LEVEL 3 - EST PT ICD-10 Diagnosis Description I70.235 Atherosclerosis of native arteries of right leg with ulceration o I70.232 Atherosclerosis of native arteries of right leg with ulceration o L97.512  Non-pressure chronic ulcer of other part of right foot with fat l I10 Essential (primary) hypertension Modifier: f other part of fo f calf ayer exposed Quantity: 1 ot Electronic Signature(s) Signed: 10/30/2017 9:41:47 AM By: Alric Quan Signed: 10/30/2017 4:26:52 PM By: Worthy Keeler PA-C Entered By: Alric Quan on 10/30/2017 09:41:46

## 2017-10-31 NOTE — Progress Notes (Signed)
EVANNA, WASHINTON ([id number]) Visit Report for 10/30/2017 Arrival Information Details Patient Name: [name], [name] Date of Service: 10/30/2017 8:45 AM Medical Record Number: [id number] Patient Account Number: [id number] Date of Birth/Sex: [birth year] (84 y.o. Female) Treating RN: Carolyne Fiscal, Debi Primary Care Upton Russey: PATIENT, NO Other Clinician: Referring Mailee Klaas: Referral, Self Treating Kadedra Vanaken/Extender: STONE III, HOYT Weeks in Treatment: 1 Visit Information History Since Last Visit All ordered tests and consults were completed: No Patient Arrived: Gilford Rile Added or deleted any medications: No Arrival Time: 08:49 Any new allergies or adverse reactions: No Accompanied By: daughter Had a fall or experienced change in No Transfer Assistance: EasyPivot Patient activities of daily living that may affect Lift risk of falls: Patient Identification Verified: Yes Signs or symptoms of abuse/neglect since last visito No Secondary Verification Process Yes Hospitalized since last visit: No Completed: Has Dressing in Place as Prescribed: Yes Patient Requires Transmission-Based No Precautions: Pain Present Now: No Patient Has Alerts: No Electronic Signature(s) Signed: 10/31/2017 9:12:55 AM By: Alric Quan Entered By: Alric Quan on 10/30/2017 08:49:50 [name] ([id number]) -------------------------------------------------------------------------------- Clinic Level of Care Assessment Details Patient Name: [name] Date of Service: 10/30/2017 8:45 AM Medical Record Number: [id number] Patient Account Number: [id number] Date of Birth/Sex: [birth year] (84 y.o. Female) Treating RN: Carolyne Fiscal, Debi Primary Care Delma Villalva: PATIENT, NO Other Clinician: Referring Amarrion Pastorino: Referral, Self Treating Caron Tardif/Extender: STONE III, HOYT Weeks in Treatment: 1 Clinic Level of Care Assessment Items TOOL 4 Quantity Score X - Use when only an EandM is performed on FOLLOW-UP visit 1  0 ASSESSMENTS - Nursing Assessment / Reassessment X - Reassessment of Co-morbidities (includes updates in patient status) 1 10 X- 1 5 Reassessment of Adherence to Treatment Plan ASSESSMENTS - Wound and Skin Assessment / Reassessment []  - Simple Wound Assessment / Reassessment - one wound 0 X- 2 5 Complex Wound Assessment / Reassessment - multiple wounds []  - 0 Dermatologic / Skin Assessment (not related to wound area) ASSESSMENTS - Focused Assessment []  - Circumferential Edema Measurements - multi extremities 0 []  - 0 Nutritional Assessment / Counseling / Intervention []  - 0 Lower Extremity Assessment (monofilament, tuning fork, pulses) []  - 0 Peripheral Arterial Disease Assessment (using hand held doppler) ASSESSMENTS - Ostomy and/or Continence Assessment and Care []  - Incontinence Assessment and Management 0 []  - 0 Ostomy Care Assessment and Management (repouching, etc.) PROCESS - Coordination of Care X - Simple Patient / Family Education for ongoing care 1 15 []  - 0 Complex (extensive) Patient / Family Education for ongoing care []  - 0 Staff obtains Programmer, systems, Records, Test Results / Process Orders []  - 0 Staff telephones HHA, Nursing Homes / Clarify orders / etc []  - 0 Routine Transfer to another Facility (non-emergent condition) []  - 0 Routine Hospital Admission (non-emergent condition) []  - 0 New Admissions / Biomedical engineer / Ordering NPWT, Apligraf, etc. []  - 0 Emergency Hospital Admission (emergent condition) X- 1 10 Simple Discharge Coordination [name], [name] ([id number]) []  - 0 Complex (extensive) Discharge Coordination PROCESS - Special Needs []  - Pediatric / Minor Patient Management 0 []  - 0 Isolation Patient Management []  - 0 Hearing / Language / Visual special needs []  - 0 Assessment of Community assistance (transportation, D/C planning, etc.) []  - 0 Additional assistance / Altered mentation []  - 0 Support Surface(s) Assessment (bed,  cushion, seat, etc.) INTERVENTIONS - Wound Cleansing / Measurement []  - Simple Wound Cleansing - one wound 0 X- 2 5 Complex Wound Cleansing - multiple wounds X- 1 5 Wound Imaging (photographs - any number of wounds) []  -  0 Wound Tracing (instead of photographs) []  - 0 Simple Wound Measurement - one wound X- 2 5 Complex Wound Measurement - multiple wounds INTERVENTIONS - Wound Dressings X - Small Wound Dressing one or multiple wounds 3 10 []  - 0 Medium Wound Dressing one or multiple wounds []  - 0 Large Wound Dressing one or multiple wounds X- 1 5 Application of Medications - topical []  - 0 Application of Medications - injection INTERVENTIONS - Miscellaneous []  - External ear exam 0 []  - 0 Specimen Collection (cultures, biopsies, blood, body fluids, etc.) []  - 0 Specimen(s) / Culture(s) sent or taken to Lab for analysis []  - 0 Patient Transfer (multiple staff / Civil Service fast streamer / Similar devices) []  - 0 Simple Staple / Suture removal (25 or less) []  - 0 Complex Staple / Suture removal (26 or more) []  - 0 Hypo / Hyperglycemic Management (close monitor of Blood Glucose) []  - 0 Ankle / Brachial Index (ABI) - do not check if billed separately X- 1 5 Vital Signs [name], [name] ([id number]) Has the patient been seen at the hospital within the last three years: Yes Total Score: 115 Level Of Care: New/Established - Level 3 Electronic Signature(s) Signed: 10/31/2017 9:12:55 AM By: Alric Quan Entered By: Alric Quan on 10/30/2017 09:41:38 [name] ([id number]) -------------------------------------------------------------------------------- Encounter Discharge Information Details Patient Name: [name] Date of Service: 10/30/2017 8:45 AM Medical Record Number: [id number] Patient Account Number: [id number] Date of Birth/Sex: 31-Jul-1919 (84 y.o. Female) Treating RN: Carolyne Fiscal, Debi Primary Care Ylianna Almanzar: PATIENT, NO Other Clinician: Referring Delvonte Berenson:  Referral, Self Treating Izan Miron/Extender: STONE III, HOYT Weeks in Treatment: 1 Encounter Discharge Information Items Discharge Pain Level: 0 Discharge Condition: Stable Ambulatory Status: Walker Discharge Destination: Home Transportation: Private Auto Accompanied By: daughter Schedule Follow-up Appointment: Yes Medication Reconciliation completed and No provided to Patient/Care Marlow Berenguer: Provided on Clinical Summary of Care: 10/30/2017 Form Type Recipient Paper Patient [facility] Electronic Signature(s) Signed: 10/31/2017 8:11:36 AM By: Ruthine Dose Entered By: Ruthine Dose on 10/30/2017 09:33:36 [name] ([id number]) -------------------------------------------------------------------------------- Lower Extremity Assessment Details Patient Name: [name] Date of Service: 10/30/2017 8:45 AM Medical Record Number: [id number] Patient Account Number: [id number] Date of Birth/Sex: Feb 15, 1919 (84 y.o. Female) Treating RN: Carolyne Fiscal, Debi Primary Care Brentton Wardlow: PATIENT, NO Other Clinician: Referring Jurgen Groeneveld: Referral, Self Treating Agustina Witzke/Extender: STONE III, HOYT Weeks in Treatment: 1 Vascular Assessment Pulses: Dorsalis Pedis Palpable: [Right:Yes] Posterior Tibial Extremity colors, hair growth, and conditions: Extremity Color: [Right:Mottled] Temperature of Extremity: [Right:Warm] Capillary Refill: [Right:< 3 seconds] Toe Nail Assessment Left: Right: Thick: Yes Discolored: No Deformed: No Improper Length and Hygiene: No Electronic Signature(s) Signed: 10/31/2017 9:12:55 AM By: Alric Quan Entered By: Alric Quan on 10/30/2017 09:01:13 [name] ([id number]) -------------------------------------------------------------------------------- Multi Wound Chart Details Patient Name: [name] Date of Service: 10/30/2017 8:45 AM Medical Record Number: [id number] Patient Account Number: [id number] Date of Birth/Sex: [birth year] (84 y.o.  Female) Treating RN: Carolyne Fiscal, Debi Primary Care Anuradha Chabot: PATIENT, NO Other Clinician: Referring Ekam Besson: Referral, Self Treating Zi Sek/Extender: STONE III, HOYT Weeks in Treatment: 1 Vital Signs Height(in): Pulse(bpm): 19 Weight(lbs): Blood Pressure(mmHg): 144/57 Body Mass Index(BMI): Temperature(F): 98.3 Respiratory Rate 16 (breaths/min): Photos: [6:No Photos] [7:No Photos] [N/A:N/A] Wound Location: [6:Right Toe Great - Medial] [7:Right Foot - Dorsal] [N/A:N/A] Wounding Event: [6:Gradually Appeared] [7:Gradually Appeared] [N/A:N/A] 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] [7:Arterial Insufficiency Ulcer] [N/A:N/A] Comorbid History: [6:Cataracts, Hypertension, Osteoarthritis] [7:Cataracts, Hypertension, Osteoarthritis] [N/A:N/A] Date Acquired: [6:10/16/2017] [7:10/11/2017] [N/A:N/A] Weeks of Treatment: [6:1] [7:1] [N/A:N/A] Wound Status: [6:Open] [7:Open] [N/A:N/A] Measurements L x W x D [6:0.6x0.2x0.1] [7:0.8x0.9x0.2] [  N/A:N/A] (cm) Area (cm) : [6:0.094] [7:0.565] [N/A:N/A] Volume (cm) : [6:0.009] [7:0.113] [N/A:N/A] % Reduction in Area: [6:40.10%] [7:0.00%] [N/A:N/A] % Reduction in Volume: [6:43.80%] [7:0.00%] [N/A:N/A] Classification: [6:Partial Thickness] [7:Partial Thickness] [N/A:N/A] Exudate Amount: [6:Large] [7:Large] [N/A:N/A] Exudate Type: [6:Serous] [7:Serous] [N/A:N/A] Exudate Color: [6:amber] [7:amber] [N/A:N/A] Wound Margin: [6:Distinct, outline attached] [7:Distinct, outline attached] [N/A:N/A] Granulation Amount: [6:Medium (34-66%)] [7:Small (1-33%)] [N/A:N/A] Granulation Quality: [6:Red] [7:Pink] [N/A:N/A] Necrotic Amount: [6:Medium (34-66%)] [7:Large (67-100%)] [N/A:N/A] Necrotic Tissue: [6:Eschar] [7:Adherent Slough] [N/A:N/A] Exposed Structures: [6:Fascia: No Fat Layer (Subcutaneous Tissue) Exposed: No Tendon: No Muscle: No Joint: No Bone: No] [7:Fascia: No Fat Layer (Subcutaneous Tissue) Exposed: No Tendon: No Muscle: No Joint: No Bone:  No] [N/A:N/A] Epithelialization: [6:None] [7:None] [N/A:N/A] Periwound Skin Texture: [6:No Abnormalities Noted] [7:No Abnormalities Noted] [N/A:N/A] Periwound Skin Moisture: [6:No Abnormalities Noted] [7:No Abnormalities Noted] [N/A:N/A] Periwound Skin Color: [6:No Abnormalities Noted] [7:No Abnormalities Noted] [N/A:N/A] Temperature: [6:No Abnormality] [7:No Abnormality] [N/A:N/A] Tenderness on Palpation: [6:Yes] [7:Yes] [N/A:N/A] Wound Preparation: Ulcer Cleansing: Ulcer Cleansing: N/A Rinsed/Irrigated with Saline Rinsed/Irrigated with Saline Topical Anesthetic Applied: Topical Anesthetic Applied: Other: lidocaine 4% Other: lidocaine 4% Treatment Notes Electronic Signature(s) Signed: 10/31/2017 9:12:55 AM By: Alric Quan Entered By: Alric Quan on 10/30/2017 09:01:37 [name] ([id number]) -------------------------------------------------------------------------------- Multi-Disciplinary Care Plan Details Patient Name: [name] Date of Service: 10/30/2017 8:45 AM Medical Record Number: [id number] Patient Account Number: [id number] Date of Birth/Sex: 1919-06-15 (84 y.o. Female) Treating RN: Carolyne Fiscal, Debi Primary Care Candid Bovey: PATIENT, NO Other Clinician: Referring Aaylah Pokorny: Referral, Self Treating Makeba Delcastillo/Extender: STONE III, HOYT Weeks in Treatment: 1 Active Inactive ` Abuse / Safety / Falls / Self Care Management Nursing Diagnoses: History of Falls Potential for falls Goals: Patient will not experience any injury related to falls Date Initiated: 10/23/2017 Target Resolution Date: 02/17/2018 Goal Status: Active Interventions: Assess Activities of Daily Living upon admission and as needed Assess fall risk on admission and as needed Assess: immobility, friction, shearing, incontinence upon admission and as needed Assess impairment of mobility on admission and as needed per policy Assess personal safety and home safety (as indicated) on admission and  as needed Assess self care needs on admission and as needed Notes: ` Nutrition Nursing Diagnoses: Imbalanced nutrition Potential for alteratiion in Nutrition/Potential for imbalanced nutrition Goals: Patient/caregiver agrees to and verbalizes understanding of need to use nutritional supplements and/or vitamins as prescribed Date Initiated: 10/23/2017 Target Resolution Date: 02/17/2018 Goal Status: Active Interventions: Assess patient nutrition upon admission and as needed per policy Notes: ` Orientation to the Wound Care Program Nursing Diagnoses: ARNETIA, BRONK ([id number]) Knowledge deficit related to the wound healing center program Goals: Patient/caregiver will verbalize understanding of the [place] Program Date Initiated: 10/23/2017 Target Resolution Date: 11/18/2017 Goal Status: Active Interventions: Provide education on orientation to the wound center Notes: ` Pain, Acute or Chronic Nursing Diagnoses: Pain, acute or chronic: actual or potential Potential alteration in comfort, pain Goals: Patient/caregiver will verbalize adequate pain control between visits Date Initiated: 10/23/2017 Target Resolution Date: 02/17/2018 Goal Status: Active Interventions: Complete pain assessment as per visit requirements Notes: ` Wound/Skin Impairment Nursing Diagnoses: Impaired tissue integrity Knowledge deficit related to ulceration/compromised skin integrity Goals: Ulcer/skin breakdown will have a volume reduction of 80% by week 12 Date Initiated: 10/23/2017 Target Resolution Date: 02/17/2018 Goal Status: Active Interventions: Assess patient/caregiver ability to perform ulcer/skin care regimen upon admission and as needed Assess ulceration(s) every visit Notes: Electronic Signature(s) Signed: 10/31/2017 9:12:55 AM By: Alric Quan Entered By: Alric Quan on 10/30/2017 Lee [name]  ([id number]) -------------------------------------------------------------------------------- Pain  Assessment Details Patient Name: ISRAEL, WUNDER Date of Service: 10/30/2017 8:45 AM Medical Record Number: [id number] Patient Account Number: [id number] Date of Birth/Sex: Jan 31, 1919 (84 y.o. Female) Treating RN: Carolyne Fiscal, Debi Primary Care Daelan Gatt: PATIENT, NO Other Clinician: Referring Skyy Nilan: Referral, Self Treating Richell Corker/Extender: STONE III, HOYT Weeks in Treatment: 1 Active Problems Location of Pain Severity and Description of Pain Patient Has Paino No Site Locations Pain Management and Medication Current Pain Management: Electronic Signature(s) Signed: 10/31/2017 9:12:55 AM By: Alric Quan Entered By: Alric Quan on 10/30/2017 08:49:56 [name] ([id number]) -------------------------------------------------------------------------------- Patient/Caregiver Education Details Patient Name: [name] Date of Service: 10/30/2017 8:45 AM Medical Record Number: [id number] Patient Account Number: [id number] Date of Birth/Gender: September 04, 1919 (84 y.o. Female) Treating RN: Carolyne Fiscal, Debi Primary Care Physician: PATIENT, NO Other Clinician: Referring Physician: Referral, Self Treating Physician/Extender: STONE III, HOYT Weeks in Treatment: 1 Education Assessment Education Provided To: Patient and Caregiver daughter Education Topics Provided Wound/Skin Impairment: Handouts: Caring for Your Ulcer, Other: change dressing as ordered Methods: Demonstration, Explain/Verbal Responses: State content correctly Electronic Signature(s) Signed: 10/31/2017 9:12:55 AM By: Alric Quan Entered By: Alric Quan on 10/30/2017 09:11:48 [name] ([id number]) -------------------------------------------------------------------------------- Wound Assessment Details Patient Name: [name] Date of Service: 10/30/2017 8:45 AM Medical Record Number:  [id number] Patient Account Number: [id number] Date of Birth/Sex: 01-29-1919 (84 y.o. Female) Treating RN: Carolyne Fiscal, Debi Primary Care Kharson Rasmusson: PATIENT, NO Other Clinician: Referring Jaidon Sponsel: Referral, Self Treating Unity Luepke/Extender: STONE III, HOYT Weeks in Treatment: 1 Wound Status Wound Number: 6 Primary Etiology: Arterial Insufficiency Ulcer Wound Location: Right Toe Great - Medial Wound Status: Open Wounding Event: Gradually Appeared Comorbid History: Cataracts, Hypertension, Osteoarthritis Date Acquired: 10/16/2017 Weeks Of Treatment: 1 Clustered Wound: No Photos Photo Uploaded By: Alric Quan on 10/30/2017 16:28:24 Wound Measurements Length: (cm) 0.6 Width: (cm) 0.2 Depth: (cm) 0.1 Area: (cm) 0.094 Volume: (cm) 0.009 % Reduction in Area: 40.1% % Reduction in Volume: 43.8% Epithelialization: None Tunneling: No Undermining: No Wound Description Classification: Partial Thickness Wound Margin: Distinct, outline attached Exudate Amount: Large Exudate Type: Serous Exudate Color: amber Foul Odor After Cleansing: No Slough/Fibrino No Wound Bed Granulation Amount: Medium (34-66%) Exposed Structure Granulation Quality: Red Fascia Exposed: No Necrotic Amount: Medium (34-66%) Fat Layer (Subcutaneous Tissue) Exposed: No Necrotic Quality: Eschar Tendon Exposed: No Muscle Exposed: No Joint Exposed: No Bone Exposed: No Periwound Skin Texture Tramontana, Rodnesha ([id number]) Texture Color No Abnormalities Noted: No No Abnormalities Noted: No Moisture Temperature / Pain No Abnormalities Noted: No Temperature: No Abnormality Tenderness on Palpation: Yes Wound Preparation Ulcer Cleansing: Rinsed/Irrigated with Saline Topical Anesthetic Applied: Other: lidocaine 4%, Treatment Notes Wound #6 (Right, Medial Toe Great) 1. Cleansed with: Clean wound with Normal Saline 2. Anesthetic Topical Lidocaine 4% cream to wound bed prior to debridement 4. Dressing  Applied: Prisma Ag Notes coverlet Electronic Signature(s) Signed: 10/31/2017 9:12:55 AM By: Alric Quan Entered By: Alric Quan on 10/30/2017 09:00:11 [name] ([id number]) -------------------------------------------------------------------------------- Wound Assessment Details Patient Name: [name] Date of Service: 10/30/2017 8:45 AM Medical Record Number: [id number] Patient Account Number: [id number] Date of Birth/Sex: 1918-12-10 (84 y.o. Female) Treating RN: Carolyne Fiscal, Debi Primary Care Giavana Rooke: PATIENT, NO Other Clinician: Referring Siler Mavis: Referral, Self Treating Jenell Dobransky/Extender: STONE III, HOYT Weeks in Treatment: 1 Wound Status Wound Number: 7 Primary Etiology: Arterial Insufficiency Ulcer Wound Location: Right Foot - Dorsal Wound Status: Open Wounding Event: Gradually Appeared Comorbid History: Cataracts, Hypertension, Osteoarthritis Date Acquired: 10/11/2017 Weeks Of Treatment: 1 Clustered Wound: No Photos Photo Uploaded By: Alric Quan on 10/30/2017 16:28:24 Wound Measurements Length: (cm) 0.8  Width: (cm) 0.9 Depth: (cm) 0.2 Area: (cm) 0.565 Volume: (cm) 0.113 % Reduction in Area: 0% % Reduction in Volume: 0% Epithelialization: None Tunneling: No Undermining: No Wound Description Classification: Partial Thickness Wound Margin: Distinct, outline attached Exudate Amount: Large Exudate Type: Serous Exudate Color: amber Foul Odor After Cleansing: No Slough/Fibrino Yes Wound Bed Granulation Amount: Small (1-33%) Exposed Structure Granulation Quality: Pink Fascia Exposed: No Necrotic Amount: Large (67-100%) Fat Layer (Subcutaneous Tissue) Exposed: No Necrotic Quality: Adherent Slough Tendon Exposed: No Muscle Exposed: No Joint Exposed: No Bone Exposed: No Periwound Skin Texture Ortez, Cornella ([id number]) Texture Color No Abnormalities Noted: No No Abnormalities Noted: No Moisture Temperature / Pain No  Abnormalities Noted: No Temperature: No Abnormality Tenderness on Palpation: Yes Wound Preparation Ulcer Cleansing: Rinsed/Irrigated with Saline Topical Anesthetic Applied: Other: lidocaine 4%, Treatment Notes Wound #7 (Right, Dorsal Foot) 1. Cleansed with: Clean wound with Normal Saline 2. Anesthetic Topical Lidocaine 4% cream to wound bed prior to debridement 3. Peri-wound Care: Skin Prep 4. Dressing Applied: Santyl Ointment 5. Secondary Dressing Applied Dry Gauze Tegaderm Electronic Signature(s) Signed: 10/31/2017 9:12:55 AM By: Alric Quan Entered By: Alric Quan on 10/30/2017 08:59:14 [name] ([id number]) -------------------------------------------------------------------------------- [place] Details Patient Name: [name] Date of Service: 10/30/2017 8:45 AM Medical Record Number: [id number] Patient Account Number: [id number] Date of Birth/Sex: 11-Sep-1919 (84 y.o. Female) Treating RN: Carolyne Fiscal, Debi Primary Care Deysha Cartier: PATIENT, NO Other Clinician: Referring Even Budlong: Referral, Self Treating Darcella Shiffman/Extender: STONE III, HOYT Weeks in Treatment: 1 Vital Signs Time Taken: 08:49 Temperature (F): 98.3 Pulse (bpm): 76 Respiratory Rate (breaths/min): 16 Blood Pressure (mmHg): 144/57 Reference Range: 80 - 120 mg / dl Electronic Signature(s) Signed: 10/31/2017 9:12:55 AM By: Alric Quan Entered By: Alric Quan on 10/30/2017 08:51:51

## 2017-11-06 ENCOUNTER — Encounter: Payer: Medicare PPO | Admitting: Physician Assistant

## 2017-11-06 DIAGNOSIS — I70235 Atherosclerosis of native arteries of right leg with ulceration of other part of foot: Secondary | ICD-10-CM | POA: Diagnosis not present

## 2017-11-07 NOTE — Progress Notes (Signed)
[name], [name] ([id number]) Visit Report for 11/06/2017 Arrival Information Details Patient Name: [name], [name] Date of Service: 11/06/2017 9:45 AM Medical Record Number: [id number] Patient Account Number: [id number] Date of Birth/Sex: 10/26/1918 (84 y.o. Female) Treating RN: Carolyne Fiscal, Debi Primary Care Kenshin Splawn: PATIENT, NO Other Clinician: Referring Breea Loncar: Referral, Self Treating Maximina Pirozzi/Extender: STONE III, HOYT Weeks in Treatment: 2 Visit Information History Since Last Visit All ordered tests and consults were completed: No Patient Arrived: Gilford Rile Added or deleted any medications: No Arrival Time: 09:57 Any new allergies or adverse reactions: No Accompanied By: daughter Had a fall or experienced change in No Transfer Assistance: EasyPivot Patient activities of daily living that may affect Lift risk of falls: Patient Identification Verified: Yes Signs or symptoms of abuse/neglect since last visito No Secondary Verification Process Yes Hospitalized since last visit: No Completed: Has Dressing in Place as Prescribed: Yes Patient Requires Transmission-Based No Precautions: Pain Present Now: No Patient Has Alerts: No Electronic Signature(s) Signed: 11/06/2017 5:10:21 PM By: Alric Quan Entered By: Alric Quan on 11/06/2017 09:58:37 [name] ([id number]) -------------------------------------------------------------------------------- Clinic Level of Care Assessment Details Patient Name: [name] Date of Service: 11/06/2017 9:45 AM Medical Record Number: [id number] Patient Account Number: [id number] Date of Birth/Sex: [birth year] (84 y.o. Female) Treating RN: Carolyne Fiscal, Debi Primary Care Lizanne Erker: PATIENT, NO Other Clinician: Referring Mallika Sanmiguel: Referral, Self Treating Joanathan Affeldt/Extender: STONE III, HOYT Weeks in Treatment: 2 Clinic Level of Care Assessment Items TOOL 4 Quantity Score X - Use when only an EandM is performed on FOLLOW-UP visit 1  0 ASSESSMENTS - Nursing Assessment / Reassessment X - Reassessment of Co-morbidities (includes updates in patient status) 1 10 X- 1 5 Reassessment of Adherence to Treatment Plan ASSESSMENTS - Wound and Skin Assessment / Reassessment []  - Simple Wound Assessment / Reassessment - one wound 0 X- 2 5 Complex Wound Assessment / Reassessment - multiple wounds []  - 0 Dermatologic / Skin Assessment (not related to wound area) ASSESSMENTS - Focused Assessment []  - Circumferential Edema Measurements - multi extremities 0 []  - 0 Nutritional Assessment / Counseling / Intervention []  - 0 Lower Extremity Assessment (monofilament, tuning fork, pulses) []  - 0 Peripheral Arterial Disease Assessment (using hand held doppler) ASSESSMENTS - Ostomy and/or Continence Assessment and Care []  - Incontinence Assessment and Management 0 []  - 0 Ostomy Care Assessment and Management (repouching, etc.) PROCESS - Coordination of Care X - Simple Patient / Family Education for ongoing care 1 15 []  - 0 Complex (extensive) Patient / Family Education for ongoing care []  - 0 Staff obtains Programmer, systems, Records, Test Results / Process Orders []  - 0 Staff telephones HHA, Nursing Homes / Clarify orders / etc []  - 0 Routine Transfer to another Facility (non-emergent condition) []  - 0 Routine Hospital Admission (non-emergent condition) []  - 0 New Admissions / Biomedical engineer / Ordering NPWT, Apligraf, etc. []  - 0 Emergency Hospital Admission (emergent condition) X- 1 10 Simple Discharge Coordination Gaulin, Naeema ([id number]) []  - 0 Complex (extensive) Discharge Coordination PROCESS - Special Needs []  - Pediatric / Minor Patient Management 0 []  - 0 Isolation Patient Management []  - 0 Hearing / Language / Visual special needs []  - 0 Assessment of Community assistance (transportation, D/C planning, etc.) []  - 0 Additional assistance / Altered mentation []  - 0 Support Surface(s) Assessment (bed,  cushion, seat, etc.) INTERVENTIONS - Wound Cleansing / Measurement []  - Simple Wound Cleansing - one wound 0 X- 2 5 Complex Wound Cleansing - multiple wounds X- 1 5 Wound Imaging (photographs - any number of wounds) []  -  0 Wound Tracing (instead of photographs) []  - 0 Simple Wound Measurement - one wound X- 2 5 Complex Wound Measurement - multiple wounds INTERVENTIONS - Wound Dressings X - Small Wound Dressing one or multiple wounds 2 10 []  - 0 Medium Wound Dressing one or multiple wounds []  - 0 Large Wound Dressing one or multiple wounds X- 1 5 Application of Medications - topical []  - 0 Application of Medications - injection INTERVENTIONS - Miscellaneous []  - External ear exam 0 []  - 0 Specimen Collection (cultures, biopsies, blood, body fluids, etc.) []  - 0 Specimen(s) / Culture(s) sent or taken to Lab for analysis []  - 0 Patient Transfer (multiple staff / Civil Service fast streamer / Similar devices) []  - 0 Simple Staple / Suture removal (25 or less) []  - 0 Complex Staple / Suture removal (26 or more) []  - 0 Hypo / Hyperglycemic Management (close monitor of Blood Glucose) []  - 0 Ankle / Brachial Index (ABI) - do not check if billed separately X- 1 5 Vital Signs Mauck, Jailyn ([id number]) Has the patient been seen at the hospital within the last three years: Yes Total Score: 105 Level Of Care: New/Established - Level 3 Electronic Signature(s) Signed: 11/06/2017 5:10:21 PM By: Alric Quan Entered By: Alric Quan on 11/06/2017 13:34:54 [name] ([id number]) -------------------------------------------------------------------------------- Encounter Discharge Information Details Patient Name: [name] Date of Service: 11/06/2017 9:45 AM Medical Record Number: [id number] Patient Account Number: [id number] Date of Birth/Sex: [birth year] (84 y.o. Female) Treating RN: Carolyne Fiscal, Debi Primary Care Breea Loncar: PATIENT, NO Other Clinician: Referring Deante Blough:  Referral, Self Treating Demarcus Thielke/Extender: STONE III, HOYT Weeks in Treatment: 2 Encounter Discharge Information Items Discharge Pain Level: 0 Discharge Condition: Stable Ambulatory Status: Walker Discharge Destination: Home Transportation: Private Auto Accompanied By: daughter Schedule Follow-up Appointment: Yes Medication Reconciliation completed and No provided to Patient/Care Emryn Flanery: Provided on Clinical Summary of Care: 11/06/2017 Form Type Recipient Paper Patient [facility] Electronic Signature(s) Signed: 11/07/2017 10:47:22 AM By: Ruthine Dose Previous Signature: 11/06/2017 10:15:59 AM Version By: Alric Quan Entered By: Ruthine Dose on 11/06/2017 10:38:15 [name] ([id number]) -------------------------------------------------------------------------------- Lower Extremity Assessment Details Patient Name: [name] Date of Service: 11/06/2017 9:45 AM Medical Record Number: [id number] Patient Account Number: [id number] Date of Birth/Sex: [birth year] (84 y.o. Female) Treating RN: Carolyne Fiscal, Debi Primary Care Suhana Wilner: PATIENT, NO Other Clinician: Referring Monasia Lair: Referral, Self Treating Shianne Zeiser/Extender: STONE III, HOYT Weeks in Treatment: 2 Vascular Assessment Pulses: Dorsalis Pedis Palpable: [Left:Yes] [Right:Yes] Posterior Tibial Extremity colors, hair growth, and conditions: Extremity Color: [Right:Mottled] Temperature of Extremity: [Right:Warm] Capillary Refill: [Right:< 3 seconds] Toe Nail Assessment Left: Right: Thick: Yes Discolored: No Deformed: No Improper Length and Hygiene: No Electronic Signature(s) Signed: 11/06/2017 10:13:33 AM By: Alric Quan Entered By: Alric Quan on 11/06/2017 10:13:33 [name] ([id number]) -------------------------------------------------------------------------------- Multi Wound Chart Details Patient Name: [name] Date of Service: 11/06/2017 9:45 AM Medical Record Number:  [id number] Patient Account Number: [id number] Date of Birth/Sex: 05-19-1919 (84 y.o. Female) Treating RN: Carolyne Fiscal, Debi Primary Care Jeremaine Maraj: PATIENT, NO Other Clinician: Referring Ethelmae Ringel: Referral, Self Treating Elcie Pelster/Extender: STONE III, HOYT Weeks in Treatment: 2 Vital Signs Height(in): Pulse(bpm): 70 Weight(lbs): Blood Pressure(mmHg): 167/60 Body Mass Index(BMI): Temperature(F): 98.0 Respiratory Rate 16 (breaths/min): Photos: [6:No Photos] [7:No Photos] [N/A:N/A] Wound Location: [6:Right Toe Great - Medial] [7:Right Foot - Dorsal] [N/A:N/A] Wounding Event: [6:Gradually Appeared] [7:Gradually Appeared] [N/A:N/A] 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] [7:Arterial Insufficiency Ulcer] [N/A:N/A] Comorbid History: [6:Cataracts, Hypertension, Osteoarthritis] [7:Cataracts, Hypertension, Osteoarthritis] [N/A:N/A] Date Acquired: [6:10/16/2017] [7:10/11/2017] [N/A:N/A] Weeks of Treatment: [6:2] [7:2] [N/A:N/A] Wound Status: [6:Open] [  7:Open] [N/A:N/A] Measurements L x W x D [6:0.6x0.2x0.1] [7:0.8x0.9x0.2] [N/A:N/A] (cm) Area (cm) : [6:0.094] [7:0.565] [N/A:N/A] Volume (cm) : [6:0.009] [7:0.113] [N/A:N/A] % Reduction in Area: [6:40.10%] [7:0.00%] [N/A:N/A] % Reduction in Volume: [6:43.80%] [7:0.00%] [N/A:N/A] Classification: [6:Partial Thickness] [7:Partial Thickness] [N/A:N/A] Exudate Amount: [6:Large] [7:Large] [N/A:N/A] Exudate Type: [6:Serous] [7:Serous] [N/A:N/A] Exudate Color: [6:amber] [7:amber] [N/A:N/A] Wound Margin: [6:Distinct, outline attached] [7:Distinct, outline attached] [N/A:N/A] Granulation Amount: [6:Large (67-100%)] [7:Small (1-33%)] [N/A:N/A] Granulation Quality: [6:Red] [7:Pink] [N/A:N/A] Necrotic Amount: [6:Small (1-33%)] [7:Large (67-100%)] [N/A:N/A] Necrotic Tissue: [6:Eschar] [7:Adherent Slough] [N/A:N/A] Exposed Structures: [6:Fascia: No Fat Layer (Subcutaneous Tissue) Exposed: No Tendon: No Muscle: No Joint: No Bone: No] [7:Fascia: No  Fat Layer (Subcutaneous Tissue) Exposed: No Tendon: No Muscle: No Joint: No Bone: No] [N/A:N/A] Epithelialization: [6:None] [7:None] [N/A:N/A] Periwound Skin Texture: [6:No Abnormalities Noted] [7:No Abnormalities Noted] [N/A:N/A] Periwound Skin Moisture: [6:No Abnormalities Noted] [7:No Abnormalities Noted] [N/A:N/A] Periwound Skin Color: [6:No Abnormalities Noted] [7:No Abnormalities Noted] [N/A:N/A] Temperature: [6:No Abnormality] [7:No Abnormality] [N/A:N/A] Tenderness on Palpation: [6:Yes] [7:Yes] [N/A:N/A] Wound Preparation: Ulcer Cleansing: Ulcer Cleansing: N/A Rinsed/Irrigated with Saline Rinsed/Irrigated with Saline Topical Anesthetic Applied: Topical Anesthetic Applied: Other: lidocaine 4% Other: lidocaine 4% Treatment Notes Electronic Signature(s) Signed: 11/06/2017 10:15:39 AM By: Alric Quan Entered By: Alric Quan on 11/06/2017 10:15:39 [name] ([id number]) -------------------------------------------------------------------------------- Multi-Disciplinary Care Plan Details Patient Name: [name] Date of Service: 11/06/2017 9:45 AM Medical Record Number: [id number] Patient Account Number: [id number] Date of Birth/Sex: December 22, 1918 (84 y.o. Female) Treating RN: Carolyne Fiscal, Debi Primary Care Darling Cieslewicz: PATIENT, NO Other Clinician: Referring Gavriel Holzhauer: Referral, Self Treating Asja Frommer/Extender: STONE III, HOYT Weeks in Treatment: 2 Active Inactive ` Abuse / Safety / Falls / Self Care Management Nursing Diagnoses: History of Falls Potential for falls Goals: Patient will not experience any injury related to falls Date Initiated: 10/23/2017 Target Resolution Date: 02/17/2018 Goal Status: Active Interventions: Assess Activities of Daily Living upon admission and as needed Assess fall risk on admission and as needed Assess: immobility, friction, shearing, incontinence upon admission and as needed Assess impairment of mobility on admission and as needed  per policy Assess personal safety and home safety (as indicated) on admission and as needed Assess self care needs on admission and as needed Notes: ` Nutrition Nursing Diagnoses: Imbalanced nutrition Potential for alteratiion in Nutrition/Potential for imbalanced nutrition Goals: Patient/caregiver agrees to and verbalizes understanding of need to use nutritional supplements and/or vitamins as prescribed Date Initiated: 10/23/2017 Target Resolution Date: 02/17/2018 Goal Status: Active Interventions: Assess patient nutrition upon admission and as needed per policy Notes: ` Orientation to the Wound Care Program Nursing Diagnoses: HEBE, MERRIWETHER ([id number]) Knowledge deficit related to the wound healing center program Goals: Patient/caregiver will verbalize understanding of the [facility] Date Initiated: 10/23/2017 Target Resolution Date: 11/18/2017 Goal Status: Active Interventions: Provide education on orientation to the wound center Notes: ` Pain, Acute or Chronic Nursing Diagnoses: Pain, acute or chronic: actual or potential Potential alteration in comfort, pain Goals: Patient/caregiver will verbalize adequate pain control between visits Date Initiated: 10/23/2017 Target Resolution Date: 02/17/2018 Goal Status: Active Interventions: Complete pain assessment as per visit requirements Notes: ` Wound/Skin Impairment Nursing Diagnoses: Impaired tissue integrity Knowledge deficit related to ulceration/compromised skin integrity Goals: Ulcer/skin breakdown will have a volume reduction of 80% by week 12 Date Initiated: 10/23/2017 Target Resolution Date: 02/17/2018 Goal Status: Active Interventions: Assess patient/caregiver ability to perform ulcer/skin care regimen upon admission and as needed Assess ulceration(s) every visit Notes: Electronic Signature(s) Signed: 11/06/2017 10:15:32 AM By: Alric Quan Entered By: Carolyne Fiscal,  Debra on 11/06/2017  10:15:31 CATIE, CHIAO ([id number]) -------------------------------------------------------------------------------- Pain Assessment Details Patient Name: DENESHA, BROUSE Date of Service: 11/06/2017 9:45 AM Medical Record Number: [id number] Patient Account Number: [id number] Date of Birth/Sex: Mar 17, 1919 (84 y.o. Female) Treating RN: Carolyne Fiscal, Debi Primary Care Yared Susan: PATIENT, NO Other Clinician: Referring Tavoris Brisk: Referral, Self Treating Cortni Tays/Extender: STONE III, HOYT Weeks in Treatment: 2 Active Problems Location of Pain Severity and Description of Pain Patient Has Paino No Site Locations Pain Management and Medication Current Pain Management: Electronic Signature(s) Signed: 11/06/2017 5:10:21 PM By: Alric Quan Entered By: Alric Quan on 11/06/2017 09:58:44 [name] ([id number]) -------------------------------------------------------------------------------- Patient/Caregiver Education Details Patient Name: [name] Date of Service: 11/06/2017 9:45 AM Medical Record Number: [id number] Patient Account Number: [id number] Date of Birth/Gender: 03/21/1919 (84 y.o. Female) Treating RN: Carolyne Fiscal, Debi Primary Care Physician: PATIENT, NO Other Clinician: Referring Physician: Referral, Self Treating Physician/Extender: Melburn Hake, HOYT Weeks in Treatment: 2 Education Assessment Education Provided To: Patient Education Topics Provided Wound/Skin Impairment: Handouts: Caring for Your Ulcer, Other: change dressing as ordered Methods: Demonstration, Explain/Verbal Responses: State content correctly Electronic Signature(s) Signed: 11/06/2017 5:10:21 PM By: Alric Quan Entered By: Alric Quan on 11/06/2017 10:16:20 [name] ([id number]) -------------------------------------------------------------------------------- Wound Assessment Details Patient Name: [name] Date of Service: 11/06/2017 9:45 AM Medical Record Number:  [id number] Patient Account Number: [id number] Date of Birth/Sex: June 13, 1919 (84 y.o. Female) Treating RN: Carolyne Fiscal, Debi Primary Care Shamicka Inga: PATIENT, NO Other Clinician: Referring Marnisha Stampley: Referral, Self Treating Imara Standiford/Extender: STONE III, HOYT Weeks in Treatment: 2 Wound Status Wound Number: 6 Primary Etiology: Arterial Insufficiency Ulcer Wound Location: Right Toe Great - Medial Wound Status: Open Wounding Event: Gradually Appeared Comorbid History: Cataracts, Hypertension, Osteoarthritis Date Acquired: 10/16/2017 Weeks Of Treatment: 2 Clustered Wound: No Photos Photo Uploaded By: Alric Quan on 11/06/2017 17:12:41 Wound Measurements Length: (cm) 0.6 Width: (cm) 0.2 Depth: (cm) 0.1 Area: (cm) 0.094 Volume: (cm) 0.009 % Reduction in Area: 40.1% % Reduction in Volume: 43.8% Epithelialization: None Tunneling: No Undermining: No Wound Description Classification: Partial Thickness Wound Margin: Distinct, outline attached Exudate Amount: Large Exudate Type: Serous Exudate Color: amber Foul Odor After Cleansing: No Slough/Fibrino No Wound Bed Granulation Amount: Large (67-100%) Exposed Structure Granulation Quality: Red Fascia Exposed: No Necrotic Amount: Small (1-33%) Fat Layer (Subcutaneous Tissue) Exposed: No Necrotic Quality: Eschar Tendon Exposed: No Muscle Exposed: No Joint Exposed: No Bone Exposed: No Periwound Skin Texture Brickle, Shaleen ([id number]) Texture Color No Abnormalities Noted: No No Abnormalities Noted: No Moisture Temperature / Pain No Abnormalities Noted: No Temperature: No Abnormality Tenderness on Palpation: Yes Wound Preparation Ulcer Cleansing: Rinsed/Irrigated with Saline Topical Anesthetic Applied: Other: lidocaine 4%, Treatment Notes Wound #6 (Right, Medial Toe Great) 1. Cleansed with: Clean wound with Normal Saline 2. Anesthetic Topical Lidocaine 4% cream to wound bed prior to debridement 4. Dressing  Applied: Prisma Ag Notes coverlet Electronic Signature(s) Signed: 11/06/2017 5:10:21 PM By: Alric Quan Entered By: Alric Quan on 11/06/2017 10:05:04 [name] ([id number]) -------------------------------------------------------------------------------- Wound Assessment Details Patient Name: [name] Date of Service: 11/06/2017 9:45 AM Medical Record Number: [id number] Patient Account Number: [id number] Date of Birth/Sex: 1919/10/02 (84 y.o. Female) Treating RN: Carolyne Fiscal, Debi Primary Care Marranda Arakelian: PATIENT, NO Other Clinician: Referring Brit Wernette: Referral, Self Treating Ardell Makarewicz/Extender: STONE III, HOYT Weeks in Treatment: 2 Wound Status Wound Number: 7 Primary Etiology: Arterial Insufficiency Ulcer Wound Location: Right Foot - Dorsal Wound Status: Open Wounding Event: Gradually Appeared Comorbid History: Cataracts, Hypertension, Osteoarthritis Date Acquired: 10/11/2017 Weeks Of Treatment: 2 Clustered Wound: No Photos Photo Uploaded By: Alric Quan on  11/06/2017 17:13:19 Wound Measurements Length: (cm) 0.8 Width: (cm) 0.9 Depth: (cm) 0.2 Area: (cm) 0.565 Volume: (cm) 0.113 % Reduction in Area: 0% % Reduction in Volume: 0% Epithelialization: None Tunneling: No Undermining: No Wound Description Classification: Partial Thickness Wound Margin: Distinct, outline attached Exudate Amount: Large Exudate Type: Serous Exudate Color: amber Foul Odor After Cleansing: No Slough/Fibrino Yes Wound Bed Granulation Amount: Small (1-33%) Exposed Structure Granulation Quality: Pink Fascia Exposed: No Necrotic Amount: Large (67-100%) Fat Layer (Subcutaneous Tissue) Exposed: No Necrotic Quality: Adherent Slough Tendon Exposed: No Muscle Exposed: No Joint Exposed: No Bone Exposed: No Periwound Skin Texture Blacksher, Marlaya ([id number]) Texture Color No Abnormalities Noted: No No Abnormalities Noted: No Moisture Temperature / Pain No  Abnormalities Noted: No Temperature: No Abnormality Tenderness on Palpation: Yes Wound Preparation Ulcer Cleansing: Rinsed/Irrigated with Saline Topical Anesthetic Applied: Other: lidocaine 4%, Treatment Notes Wound #7 (Right, Dorsal Foot) 1. Cleansed with: Clean wound with Normal Saline 2. Anesthetic Topical Lidocaine 4% cream to wound bed prior to debridement 3. Peri-wound Care: Barrier cream 4. Dressing Applied: Santyl Ointment 5. Secondary Dressing Applied Dry Gauze Tegaderm Electronic Signature(s) Signed: 11/06/2017 5:10:21 PM By: Alric Quan Entered By: Alric Quan on 11/06/2017 10:05:21 [name] ([id number]) -------------------------------------------------------------------------------- Vitals Details Patient Name: [name] Date of Service: 11/06/2017 9:45 AM Medical Record Number: [id number] Patient Account Number: [id number] Date of Birth/Sex: 11-10-1918 (84 y.o. Female) Treating RN: Carolyne Fiscal, Debi Primary Care Jahmia Berrett: PATIENT, NO Other Clinician: Referring Tayden Duran: Referral, Self Treating Marsha Hillman/Extender: STONE III, HOYT Weeks in Treatment: 2 Vital Signs Time Taken: 09:58 Temperature (F): 98.0 Pulse (bpm): 70 Respiratory Rate (breaths/min): 16 Blood Pressure (mmHg): 167/60 Reference Range: 80 - 120 mg / dl Electronic Signature(s) Signed: 11/06/2017 5:10:21 PM By: Alric Quan Entered By: Alric Quan on 11/06/2017 10:00:37

## 2017-11-07 NOTE — Progress Notes (Signed)
DEVLIN, MCVEIGH ([id number]) Visit Report for 11/06/2017 Chief Complaint Document Details Patient Name: [name], [name] Date of Service: 11/06/2017 9:45 AM Medical Record Number: [id number] Patient Account Number: [id number] Date of Birth/Sex: 1918-10-29 (84 y.o. Female) Treating RN: Carolyne Fiscal, Debi Primary Care Provider: PATIENT, NO Other Clinician: Referring Provider: Referral, Self Treating Provider/Extender: STONE III, HOYT Weeks in Treatment: 2 Information Obtained from: Patient Chief Complaint Right foot Ulcers Electronic Signature(s) Signed: 11/06/2017 1:29:11 PM By: Worthy Keeler PA-C Entered By: Worthy Keeler on 11/06/2017 09:47:23 [name] ([id number]) -------------------------------------------------------------------------------- HPI Details Patient Name: [name] Date of Service: 11/06/2017 9:45 AM Medical Record Number: [id number] Patient Account Number: [id number] Date of Birth/Sex: [birth year] (84 y.o. Female) Treating RN: Carolyne Fiscal, Debi Primary Care Provider: PATIENT, NO Other Clinician: Referring Provider: Referral, Self Treating Provider/Extender: STONE III, HOYT Weeks in Treatment: 2 History of Present Illness HPI Description: 04/06/16; this is a 84 year old woman who arrives accompanied by 2 daughters for a wound on her left ankle and her left fifth toe. These have apparently been present for a year. I'm not quite certain how she came to this clinic however she was being followed by Sharlotte Alamo her podiatrist for these wounds. She was also referred to [facility] and they apparently did a test presumably arterial studies although we don't have any of these results and we couldn't get through to the office today. The family but has been applying a combination of Bactroban and a light bandage and perhaps more recently Silvadene cream. She did have an x-ray of the foot roughly 6 months ago at the podiatry office the family was unaware  that if there were any abnormalities. Apparently they have not seen any healing here. Our intake nurse noted a slight skin tear on the right anterior lower leg. Nobody seemed aware of this. ABIs calculated in this clinic was 0.3 on the right and 0.4 on the left I have reviewed things in [facility]. There is very little information on this patient. She apparently follows in current [facility] clinic which we don't have information from. She has mentioned already been to a [facility]. She has a history of hypothyroidism, nephrolithiasis arthritis and has had a previous mastectomy. 04/13/16; patient's x-ray was normal. She is already been to see Dr. Delana Meyer vascular surgery. Her arterial exam was from November 2016 this showed a left ABI of 0.62 her right of 0.76. Her duplex ultrasound of the left leg showed biphasic waves in the common femoral and distal femoral artery however monophasic waves in the superficial femoral artery proximal and mid biphasic it distal. 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 The patient tells me that she has pain at night when she tries to lie down which is improved by getting up and sitting in the chair this sounds like claudication at rest. Her wounds are on the left medial malleolus and the dorsal fifth toe small punched out wounds that are right on bone. We use Santyl last week 04/20/16: nurse informed me pt has declined evaluation for significant PAD. she denies systemic s/s of infections. 04/27/16;; the patient has had noninvasive arterial studies done in November 2016. ABI and the left was 0.62. Monophasic waves at the superficial femoral artery. Occluded to the posterior tibial artery. So had greater than 50% stenosis of the right superficial femoral and greater than 50% stenosis of the left superficial femoral artery. She had bilateral tibial peroneal artery disease. Both of the wounds on the left fifth toe and left lateral malleolus have  been present for more than a year.  They have been to see vein and vascular. The patient has some pain but miraculously I think the wounds have largely been stable. No evidence of infection 05/04/16; she goes for a noninvasive study tomorrow and then sees Dr. Fletcher Anon on Monday. By the time she is here next week we should have a better picture of whether something can be done with regards to her arterial flow. We continue to have ischemic-looking wounds on the left fifth toe and left lateral malleolus. 05/10/16; the patient went for her arterial studies and saw Dr. Fletcher Anon. As predicted she is felt to have critical limb ischemia. The feeling is that she has occlusion of the SFA. The feeling would she would be a candidate for a stent to the SFA. The patient did not make a decision to proceed with the procedure and she is here with family members to discuss this me today. He shouldn't has a lot of pain and cannot sleep and rest well at night per her family. 05/24/16; the patient went and had a complex revascularization/angioplasty of the left superficial femoral artery followed by drug-coated balloon angioplasty and spots stenting. She tolerated the procedure well. She was recommended for dual antiplatelet drugs with Plavix and aspirin for at least a month. She went yesterday for I believe follow-up serial Dopplers and ABIs although I don't see these results. The patient is unfortunately complaining of a lot of pain in her bilateral lower legs below the knees from the ankle to the knees. She apparently was prescribed lidocaine and apparently put this on her legs instead of over the wound areas. This may have something to do with it however there is a lot of edema in her bilateral legs I was able to find her arterial studies from yesterday. The left ABI has improved up to 0.66 post left SFA stent. The bilateral great toe indices remain abnormal with the left being in the 0.25 range. Duplex ultrasound showed her left SFA stent is  patent monophasic waveforms persist in the left leg 06/07/16; continued punched-out areas over the dorsal left fifth toe and left lateral malleolus. No major improvement 06/28/16; the areas over her dorsal left fifth toe and left lateral malleolus are covered in surface slough we are using Iodoflex 07/05/16. We have been using Iodoflex for 2-3 weeks now. I have not been debridement is because of pain PINKIE, MANGER ([id number]) 07/19/16 currently we have been using Iodoflex for roughly the past month. Previously we were unable to debride due to pain although today patient states that the pain is not nearly as severe as it has been in the past. In fact she rates this to be a 1 out of 10 and it worse to a to 10 with palpation of the wound. All and all her and her daughter feel like this is actually doing steadily better at this point in time. She is pleased with progress and we have been seeing her every 2 weeks. 08/02/16; this is a delightful 84 year old woman I have not seen in over a month. She has arterial insufficiency wounds remaining over the left lateral malleolus and the left fifth toe. With the help of Dr. Fletcher Anon we are able to get her revascularized on the left. The 2 wounds on the left have been making good progress and are definitely smaller especially the area over the left lateral malleolus. She is certainly in a lot less pain than she used to be although I still  think there is some claudication type pain. Unfortunately she is developed a area on the right lateral foot which is a small open area but I think is threatened. I suspect a small ischemic areas well. Also on her right fifth toe there is an area that is not open but looks as though it is receiving too much pressure from footwear. Finally an area over her right malleolus although I don't think this is on its way to anything ominous 08/17/16 patient presents today for follow up evaluation she tells me that she is really doing  fairly well from a pain standpoint compared to where she has been previous. She is tolerating the dressing changes without any complication. She continues to have discharge and drainage however. 08-30-16 Ms. Volkert presents today with her daughter, she states that she continues to have intermittent shooting pains to the left lateral fifth toe at this site of seems to be a healed ulcer. She denies any other issues that her wound related since her last appointment. Her daughter states that she is in need of a tramadol refill at this time as she uses half a tablet at at bedtime to aid in sleep due to foot pain. Iodoflex has been used on all wounds in previous dressing changes. 09/13/16; the patient has ischemic wounds in her feet. The area over the left lateral malleolus is healed and the area over the left fifth toe looks improved.. She has an area on the lateral aspect of her right foot which is a small but deep wound. Finally she has a new open area on the medial aspect of the left medial malleolus. This is superficial 09/27/16; the area over the left lateral malleolus remains healed. The area over the left fifth toe has a surface and she states the pain is better but I don't think this is completely closed. She has an area on the lateral aspect of her right foot is a small but deep wound. The new open area on the medial aspect of the left ankle was closed from last time. 10/18/16; the area over her left lateral malleolus remains healed. The area over the left fifth toe has a surface over the top of this however there is no overt open area. Given the underlying issues of severe PAD and continued pain in the toe I would think it would be unlikely this is truly healed however I am not planning to debridement this area. The area on the right lateral foot which was a more recent wound is a small punched out painful area again has significant surface slough and nonviable tissue. It is clear the patient  still has claudication type pain however she remains functional. I have been giving her tramadol when necessary and that seems to help a lot with her pain the patient follows up with Dr. Fletcher Anon on January 18/18 11/01/16; patient missed her follow-up with Dr. Fletcher Anon last week due to a snow day. Follow-up is now on February 15. The areas on the right lateral malleolus and dorsal right fifth toe remain closed over. The fifth toe was tentative is there is a surface eschar however I'm not going to disturb this. Therefore, her only open areas on the right lateral foot. This is a small punched out area. We have been using Prisma. I'm quite convinced this is an ischemic wound 11/15/16; patient has follow-up with Dr. Fletcher Anon on February 15. She has no open wound on the left leg and doesn't really complain of claudication that  I can determine from talking to her. However on the right leg she clearly has some degree of claudication. The remaining open wound is on the right lateral foot. We have been using Iodosorb ointment 11/29/16; patient saw Dr. Fletcher Anon on 11/24/16. His comment is that her wound on her right lateral foot seems to be improving with local wound care. She has known significant right SFA disease. Her lower extremity Doppler will be repeated and according to the patient's daughter that appointment is on March 15. He is left with the thought that endovascular intervention in the right SFA might be necessary. The patient has quite a bit of pain especially at night related to the wound in her right foot. We changed her to Iodosorb ointment last week 12/13/16; this is a patient I follow every 2 weeks largely on a palliative approach at this point about ischemic wounds currently in the right foot. Initially she had them on her left lateral malleolus and left fifth toe. The area on the left lateral malleolus healed and the fifth toe has a surface eschar that I have elected not to remove. Both of these improved  after revascularization by Dr. Fletcher Anon. We have been using Iodosorb to the right lateral foot not much change here. 12/27/16; the patient had her arterial studies. This showed known bilateral SFA disease. Stable right ABI 0.5 to stable left ABI at 0.7 to the did not do it TBI on the right it was 0.61 on the left she has a stent in the long segment of her left SFA from 05/18/16. All of her wounds are somewhat better. She states her pain is better in the right foot is improved. 01/10/17- patient is here for follow-up dilation of her right lateral foot ulcer. She has been tolerating Iodosorb. She is voices no complaints or concerns 01/24/17; small ischemic wound on right lateral foot. still non viable cover. follows with Dr. Fletcher Anon on 4/30. No open area on left foot Seoane, Estill Bamberg ([id number]) 02/07/17 doing well and states she is pain free. Saw Dr. Fletcher Anon who said she is doing well and has "50% flow in the right leg and 70% in the left. According to her daughter he does not wish to do any further interventions 02/21/17; small ischemic wound on the right lateral foot. Per her daughter and the patient she has no open wound on the left foot and ankle which were her initial presenting wounds. still has claudication type pain at night she is been to see interventional cardiology who does not feel that she has a need for intervention on the right leg at present. She still has claudication type pain in the right leg which she manages at night with when necessary tramadol that I have prescribed 03/14/17; small ischemic wound on the right lateral foot. They've been dressing this at home with Iodosorb ointment. The patient does not complain of any pain. The wound has a surface eschar over it and there is really no visible open area. This is similar to how the areas on the left leg healed 04/11/17; the small ischemic wound on her right lateral foot is finally closed over. Small amount of eschar over the surface however I did  not attempt to remove this. The patient claims to be asymptomatic she is not having any claudication that I'm able to elicit although her activity is limited Readmission: 10/23/17 on evaluation today patient appears to be doing somewhat poorly in regard to her right foot where she has two ulcers at  this point. The worst is on the lateral aspect of her foot medial first metatarsal region is not nearly as bad. With that being said these did arise seemingly out of nowhere she has previously had a similar issue in the past these have always been ischemic wounds due to arterial insufficiency. She does see Dr. Fletcher Anon as her vascular specialist and it has been noted that her ABI's are somewhat low. She actually has a repeat evaluation coming up this March 2019 to reevaluate her blood flow. On the last check 12/22/16 it was noted that patient had stable ABI's in the lower moderate abnormal range in regard to the right in regard to the left and the upper moderate at normal range. Patient is having some discomfort though nothing severe at this point in time. She is seen with her daughter and son-in-law today. No fevers, chills, nausea, or vomiting noted at this time. 10/30/17 on evaluation today patient appears to be doing well in regard to her wounds. The right lateral foot wound appears to show signs of cleaning up nicely there is granulation noted underneath that the bed of the wound although there still some [facility] covering. Nonetheless she still has a lot of discomfort and I really do not want to proceed with debridement due to the fact that she does have so much discomfort. Good news is the tramadol has been helping her at bedtime she only takes this once a day and that has been extremely beneficial. Unfortunately she does not have a primary right now as she is awaiting a new one which is the reason that I am prescribing the tramadol. Nonetheless I'm pleased with how things seem to be progressing in regard  to her ulcers. 11/06/17 on evaluation today patient appears to be doing decently well as far as maintaining in regard to her ulcers. Unfortunately I did review her arterial study which shows that she has on the right arterial obstruction involving the superficial femoral and popliteal artery as well is the superficial femoral artery with a high grade stenosis versus inclusion. Collateral flow noted in the distal portion of the SFA. Severe progression is noted compared to previous study. Unfortunately being the patient's wounds do not seem to be healing as appropriately and quickly as we would like I do believe this is likely a result of poor vascular flow to the right lower extremity. This was discussed with patient and her daughter during the office visit today. Electronic Signature(s) Signed: 11/06/2017 1:29:11 PM By: Worthy Keeler PA-C Entered By: Worthy Keeler on 11/06/2017 10:51:09 [name] ([id number]) -------------------------------------------------------------------------------- Physical Exam Details Patient Name: [name] Date of Service: 11/06/2017 9:45 AM Medical Record Number: [id number] Patient Account Number: [id number] Date of Birth/Sex: 1919-02-04 (84 y.o. Female) Treating RN: Carolyne Fiscal, Debi Primary Care Provider: PATIENT, NO Other Clinician: Referring Provider: Referral, Self Treating Provider/Extender: STONE III, HOYT Weeks in Treatment: 2 Constitutional Well-nourished and well-hydrated in no acute distress. Respiratory normal breathing without difficulty. clear to auscultation bilaterally. Cardiovascular regular rate and rhythm with normal S1, S2. Absent posterior tibial and dorsalis pedis pulses bilateral lower extremities. no clubbing, cyanosis, significant edema, <3 sec cap refill. Psychiatric this patient is able to make decisions and demonstrates good insight into disease process. Alert and Oriented x 3. pleasant and  cooperative. Notes Patient's wound on the lateral portion of her foot continues to have slough noted in the wound bed unfortunately. However no debridement was performed due to patient's vascular status. Electronic Signature(s) Signed: 11/06/2017 1:29:11  PM By: Worthy Keeler PA-C Entered By: Worthy Keeler on 11/06/2017 10:52:06 [name] ([id number]) -------------------------------------------------------------------------------- Physician Orders Details Patient Name: [name] Date of Service: 11/06/2017 9:45 AM Medical Record Number: [id number] Patient Account Number: [id number] Date of Birth/Sex: [birth year] (84 y.o. Female) Treating RN: Carolyne Fiscal, Debi Primary Care Provider: PATIENT, NO Other Clinician: Referring Provider: Referral, Self Treating Provider/Extender: STONE III, HOYT Weeks in Treatment: 2 Verbal / Phone Orders: Yes Clinician: Pinkerton, Debi Read Back and Verified: Yes Diagnosis Coding ICD-10 Coding Code Description I70.235 Atherosclerosis of native arteries of right leg with ulceration of other part of foot I70.232 Atherosclerosis of native arteries of right leg with ulceration of calf L97.512 Non-pressure chronic ulcer of other part of right foot with fat layer exposed I10 Essential (primary) hypertension Wound Cleansing Wound #6 Right,Medial Toe Great o Clean wound with Normal Saline. o Cleanse wound with mild soap and water o May Shower, gently pat wound dry prior to applying new dressing. Wound #7 Right,Dorsal Foot o Clean wound with Normal Saline. o Cleanse wound with mild soap and water o May Shower, gently pat wound dry prior to applying new dressing. Anesthetic (add to Medication List) Wound #6 Right,Medial Toe Great o Topical Lidocaine 4% cream applied to wound bed prior to debridement (In Clinic Only). Wound #7 Right,Dorsal Foot o Topical Lidocaine 4% cream applied to wound bed prior to debridement (In Clinic  Only). Skin Barriers/Peri-Wound Care Wound #7 Right,Dorsal Foot o Skin Prep Primary Wound Dressing Wound #6 Right,Medial Toe Great o Prisma Ag Wound #7 Right,Dorsal Foot o Santyl Ointment Secondary Dressing Wound #6 Right,Medial Toe Great o Other - coverlet Wound #7 Right,Dorsal Foot o Dry Gauze Stigall, [place] ([id number]) o Tegaderm Dressing Change Frequency Wound #6 Right,Medial Toe Great o Change dressing every day. Wound #7 Right,Dorsal Foot o Change dressing every day. Follow-up Appointments Wound #6 Right,Medial Toe Great o Return Appointment in 1 week. Wound #7 Right,Dorsal Foot o Return Appointment in 1 week. Edema Control Wound #6 Right,Medial Toe Great o Elevate legs to the level of the heart and pump ankles as often as possible Wound #7 Right,Dorsal Foot o Elevate legs to the level of the heart and pump ankles as often as possible Off-Loading Wound #6 Right,Medial Toe Great o Turn and reposition every 2 hours Wound #7 Right,Dorsal Foot o Turn and reposition every 2 hours Additional Orders / Instructions Wound #6 Right,Medial Toe Great o Increase protein intake. o Other: - Vitamin C, Zinc Wound #7 Right,Dorsal Foot o Increase protein intake. o Other: - Vitamin C, Zinc Consults o Vascular - [facility] Patient Medications Allergies: codeine Notifications Medication Indication Start End lidocaine DOSE 1 - topical 4 % cream - 1 cream topical Electronic Signature(s) Signed: 11/06/2017 1:29:11 PM By: [name], [place] ([id number]) Signed: 11/06/2017 5:10:21 PM By: Alric Quan Entered By: Alric Quan on 11/06/2017 10:20:09 [name] ([id number]) -------------------------------------------------------------------------------- Prescription 11/06/2017 Patient Name: [name] Provider: Worthy Keeler PA-C Date of Birth: 11/13/1918 NPI#: [id number] Sex: F DEA#: [id number] Phone #:  [phone] License #: Patient Address: [facility] [place], [place] [address] [address], [place], [place] [address] [phone] Allergies codeine Reaction: sick on stomach Severity: Severe Medication Medication: Route: Strength: Form: lidocaine topical 4% cream Class: TOPICAL LOCAL ANESTHETICS Dose: Frequency / Time: Indication: 1 1 cream topical Number of Refills: Number of Units: 0 Generic Substitution: Start Date: End Date: Administered at [facility]: Yes  Time Administered: Time Discontinued: Note to Pharmacy: Signature(s): Date(s): Electronic Signature(s) Signed: 11/06/2017 1:29:11 PM By: Worthy Keeler PA-C Signed: 11/06/2017 5:10:21 PM By: Myriam Jacobson, Cleora ([id number]) Entered By: Alric Quan on 11/06/2017 10:20:11 [name] ([id number]) --------------------------------------------------------------------------------  Problem List Details Patient Name: [name] Date of Service: 11/06/2017 9:45 AM Medical Record Number: [id number] Patient Account Number: [id number] Date of Birth/Sex: [birth year] (84 y.o. 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 10/23/2017 Yes L97.512 Non-pressure chronic ulcer of other part of right foot with fat layer 10/23/2017 Yes exposed [place] (primary) hypertension 10/24/2017 Yes Inactive Problems Resolved Problems Electronic Signature(s) Signed: 11/06/2017 1:29:11 PM By:  Worthy Keeler PA-C Entered By: Worthy Keeler on 11/06/2017 10:47:02 [name] ([id number]) -------------------------------------------------------------------------------- Progress Note Details Patient Name: [name] Date of Service: 11/06/2017 9:45 AM Medical Record Number: [id number] Patient Account Number: [id number] Date of Birth/Sex: [birth year] (84 y.o. Female) Treating RN: Carolyne Fiscal, Debi Primary Care Provider: PATIENT, NO Other Clinician: Referring Provider: Referral, Self Treating Provider/Extender: STONE III, HOYT Weeks in Treatment: 2 Subjective Chief Complaint Information obtained from Patient Right foot Ulcers History of Present Illness (HPI) 04/06/16; this is a 84 year old woman who arrives accompanied by 2 daughters for a wound on her left ankle and her left fifth toe. These have apparently been present for a year. I'm not quite certain how she came to this clinic however she was being followed by Sharlotte Alamo her podiatrist for these wounds. She was also referred to [facility] and they apparently did a test presumably arterial studies although we don't have any of these results and we couldn't get through to the office today. The family but has been applying a combination of Bactroban and a light bandage and perhaps more recently Silvadene cream. She did have an x-ray of the foot roughly 6 months ago at the podiatry office the family was unaware that if there were any abnormalities. Apparently they have not seen any healing here. Our intake nurse noted a slight skin tear on the right anterior lower leg. Nobody seemed aware of this. ABIs calculated in this clinic was 0.3 on the right and 0.4 on the left I have reviewed things [facility]. There is very little information on this patient. She apparently follows in current total clinic which we don't have information from. She has mentioned already been to a [facility]. She has a history  of hypothyroidism, nephrolithiasis arthritis and has had a previous mastectomy. 04/13/16; patient's x-ray was normal. She is already been to see Dr. Delana Meyer vascular surgery. Her arterial exam was from November 2016 this showed a left ABI of 0.62 her right of 0.76. Her duplex ultrasound of the left leg showed biphasic waves in the common femoral and distal femoral artery however monophasic waves in the superficial femoral artery proximal and mid biphasic it distal. 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 The patient tells me that she has pain at night when she tries to lie down which is improved by getting up and sitting in the chair this sounds like claudication at rest. Her wounds are on the left medial malleolus and the dorsal fifth toe small punched out  wounds that are right on bone. We use Santyl last week 04/20/16: nurse informed me pt has declined evaluation for significant PAD. she denies systemic s/s of infections. 04/27/16;; the patient has had noninvasive arterial studies done in November 2016. ABI and the left was 0.62. Monophasic waves at the superficial femoral artery. Occluded to the posterior tibial artery. So had greater than 50% stenosis of the right superficial femoral and greater than 50% stenosis of the left superficial femoral artery. She had bilateral tibial peroneal artery disease. Both of the wounds on the left fifth toe and left lateral malleolus have been present for more than a year. They have been to see vein and vascular. The patient has some pain but miraculously I think the wounds have largely been stable. No evidence of infection 05/04/16; she goes for a noninvasive study tomorrow and then sees Dr. Fletcher Anon on Monday. By the time she is here next week we should have a better picture of whether something can be done with regards to her arterial flow. We continue to have ischemic-looking wounds on the left fifth toe and left lateral malleolus. 05/10/16; the patient went for  her arterial studies and saw Dr. Fletcher Anon. As predicted she is felt to have critical limb ischemia. The feeling is that she has occlusion of the SFA. The feeling would she would be a candidate for a stent to the SFA. The patient did not make a decision to proceed with the procedure and she is here with family members to discuss this me today. He shouldn't has a lot of pain and cannot sleep and rest well at night per her family. 05/24/16; the patient went and had a complex revascularization/angioplasty of the left superficial femoral artery followed by drug-coated balloon angioplasty and spots stenting. She tolerated the procedure well. She was recommended for dual antiplatelet drugs with Plavix and aspirin for at least a month. She went yesterday for I believe follow-up serial Dopplers and ABIs although I don't see these results. The patient is unfortunately complaining of a lot of pain in her bilateral lower legs below the knees from the ankle to the knees. She apparently was prescribed lidocaine and apparently put this on her legs instead of over the wound areas. This may have something to do with it however there is a lot of edema in her bilateral legs I was able to find her arterial studies from yesterday. The left ABI has improved up to 0.66 post left SFA stent. The bilateral Mcglamery, Tavaria ([id number]) great toe indices remain abnormal with the left being in the 0.25 range. Duplex ultrasound showed her left SFA stent is patent monophasic waveforms persist in the left leg 06/07/16; continued punched-out areas over the dorsal left fifth toe and left lateral malleolus. No major improvement 06/28/16; the areas over her dorsal left fifth toe and left lateral malleolus are covered in surface slough we are using Iodoflex 07/05/16. We have been using Iodoflex for 2-3 weeks now. I have not been debridement is because of pain 07/19/16 currently we have been using Iodoflex for roughly the past month.  Previously we were unable to debride due to pain although today patient states that the pain is not nearly as severe as it has been in the past. In fact she rates this to be a 1 out of 10 and it worse to a to 10 with palpation of the wound. All and all her and her daughter feel like this is actually doing steadily better at this point  in time. She is pleased with progress and we have been seeing her every 2 weeks. 08/02/16; this is a delightful 84 year old woman I have not seen in over a month. She has arterial insufficiency wounds remaining over the left lateral malleolus and the left fifth toe. With the help of Dr. Fletcher Anon we are able to get her revascularized on the left. The 2 wounds on the left have been making good progress and are definitely smaller especially the area over the left lateral malleolus. She is certainly in a lot less pain than she used to be although I still think there is some claudication type pain. Unfortunately she is developed a area on the right lateral foot which is a small open area but I think is threatened. I suspect a small ischemic areas well. Also on her right fifth toe there is an area that is not open but looks as though it is receiving too much pressure from footwear. Finally an area over her right malleolus although I don't think this is on its way to anything ominous 08/17/16 patient presents today for follow up evaluation she tells me that she is really doing fairly well from a pain standpoint compared to where she has been previous. She is tolerating the dressing changes without any complication. She continues to have discharge and drainage however. 08-30-16 Ms. Epps presents today with her daughter, she states that she continues to have intermittent shooting pains to the left lateral fifth toe at this site of seems to be a healed ulcer. She denies any other issues that her wound related since her last appointment. Her daughter states that she is in need  of a tramadol refill at this time as she uses half a tablet at at bedtime to aid in sleep due to foot pain. Iodoflex has been used on all wounds in previous dressing changes. 09/13/16; the patient has ischemic wounds in her feet. The area over the left lateral malleolus is healed and the area over the left fifth toe looks improved.. She has an area on the lateral aspect of her right foot which is a small but deep wound. Finally she has a new open area on the medial aspect of the left medial malleolus. This is superficial 09/27/16; the area over the left lateral malleolus remains healed. The area over the left fifth toe has a surface and she states the pain is better but I don't think this is completely closed. She has an area on the lateral aspect of her right foot is a small but deep wound. The new open area on the medial aspect of the left ankle was closed from last time. 10/18/16; the area over her left lateral malleolus remains healed. The area over the left fifth toe has a surface over the top of this however there is no overt open area. Given the underlying issues of severe PAD and continued pain in the toe I would think it would be unlikely this is truly healed however I am not planning to debridement this area. The area on the right lateral foot which was a more recent wound is a small punched out painful area again has significant surface slough and nonviable tissue. It is clear the patient still has claudication type pain however she remains functional. I have been giving her tramadol when necessary and that seems to help a lot with her pain the patient follows up with Dr. Fletcher Anon on January 18/18 11/01/16; patient missed her follow-up with Dr. Fletcher Anon last  week due to a snow day. Follow-up is now on February 15. The areas on the right lateral malleolus and dorsal right fifth toe remain closed over. The fifth toe was tentative is there is a surface eschar however I'm not going to disturb this.  Therefore, her only open areas on the right lateral foot. This is a small punched out area. We have been using Prisma. I'm quite convinced this is an ischemic wound 11/15/16; patient has follow-up with Dr. Fletcher Anon on February 15. She has no open wound on the left leg and doesn't really complain of claudication that I can determine from talking to her. However on the right leg she clearly has some degree of claudication. The remaining open wound is on the right lateral foot. We have been using Iodosorb ointment 11/29/16; patient saw Dr. Fletcher Anon on 11/24/16. His comment is that her wound on her right lateral foot seems to be improving with local wound care. She has known significant right SFA disease. Her lower extremity Doppler will be repeated and according to the patient's daughter that appointment is on March 15. He is left with the thought that endovascular intervention in the right SFA might be necessary. The patient has quite a bit of pain especially at night related to the wound in her right foot. We changed her to Iodosorb ointment last week 12/13/16; this is a patient I follow every 2 weeks largely on a palliative approach at this point about ischemic wounds currently in the right foot. Initially she had them on her left lateral malleolus and left fifth toe. The area on the left lateral malleolus healed and the fifth toe has a surface eschar that I have elected not to remove. Both of these improved after revascularization by Dr. Fletcher Anon. We have been using Iodosorb to the right lateral foot not much change here. 12/27/16; the patient had her arterial studies. This showed known bilateral SFA disease. Stable right ABI 0.5 to stable left ABI at 0.7 to the did not do it TBI on the right it was 0.61 on the left she has a stent in the long segment of her left SFA from [place] ([id number]) 05/18/16. All of her wounds are somewhat better. She states her pain is better in the right foot is  improved. 01/10/17- patient is here for follow-up dilation of her right lateral foot ulcer. She has been tolerating Iodosorb. She is voices no complaints or concerns 01/24/17; small ischemic wound on right lateral foot. still non viable cover. follows with Dr. Fletcher Anon on 4/30. No open area on left foot 02/07/17 doing well and states she is pain free. Saw Dr. Fletcher Anon who said she is doing well and has "50% flow in the right leg and 70% in the left. According to her daughter he does not wish to do any further interventions 02/21/17; small ischemic wound on the right lateral foot. Per her daughter and the patient she has no open wound on the left foot and ankle which were her initial presenting wounds. still has claudication type pain at night she is been to see interventional cardiology who does not feel that she has a need for intervention on the right leg at present. She still has claudication type pain in the right leg which she manages at night with when necessary tramadol that I have prescribed 03/14/17; small ischemic wound on the right lateral foot. They've been dressing this at home with Iodosorb ointment. The patient does not complain of any pain. The wound  has a surface eschar over it and there is really no visible open area. This is similar to how the areas on the left leg healed 04/11/17; the small ischemic wound on her right lateral foot is finally closed over. Small amount of eschar over the surface however I did not attempt to remove this. The patient claims to be asymptomatic she is not having any claudication that I'm able to elicit although her activity is limited Readmission: 10/23/17 on evaluation today patient appears to be doing somewhat poorly in regard to her right foot where she has two ulcers at this point. The worst is on the lateral aspect of her foot medial first metatarsal region is not nearly as bad. With that being said these did arise seemingly out of nowhere she has previously  had a similar issue in the past these have always been ischemic wounds due to arterial insufficiency. She does see Dr. Fletcher Anon as her vascular specialist and it has been noted that her ABI's are somewhat low. She actually has a repeat evaluation coming up this March 2019 to reevaluate her blood flow. On the last check 12/22/16 it was noted that patient had stable ABI's in the lower moderate abnormal range in regard to the right in regard to the left and the upper moderate at normal range. Patient is having some discomfort though nothing severe at this point in time. She is seen with her daughter and son-in-law today. No fevers, chills, nausea, or vomiting noted at this time. 10/30/17 on evaluation today patient appears to be doing well in regard to her wounds. The right lateral foot wound appears to show signs of cleaning up nicely there is granulation noted underneath that the bed of the wound although there still some [facility] covering. Nonetheless she still has a lot of discomfort and I really do not want to proceed with debridement due to the fact that she does have so much discomfort. Good news is the tramadol has been helping her at bedtime she only takes this once a day and that has been extremely beneficial. Unfortunately she does not have a primary right now as she is awaiting a new one which is the reason that I am prescribing the tramadol. Nonetheless I'm pleased with how things seem to be progressing in regard to her ulcers. 11/06/17 on evaluation today patient appears to be doing decently well as far as maintaining in regard to her ulcers. Unfortunately I did review her arterial study which shows that she has on the right arterial obstruction involving the superficial femoral and popliteal artery as well is the superficial femoral artery with a high grade stenosis versus inclusion. Collateral flow noted in the distal portion of the SFA. Severe progression is noted compared to previous  study. Unfortunately being the patient's wounds do not seem to be healing as appropriately and quickly as we would like I do believe this is likely a result of poor vascular flow to the right lower extremity. This was discussed with patient and her daughter during the office visit today. Patient History Information obtained from Patient. Family History Diabetes - Mother, Heart Disease - Siblings, Stroke - Siblings, No family history of Cancer, Hereditary Spherocytosis, Hypertension, Kidney Disease, Lung Disease, Seizures, Thyroid Problems, Tuberculosis. Social History Never smoker, Marital Status - Widowed, Alcohol Use - Never, Drug Use - No History, Caffeine Use - Daily. Review of Systems (ROS) Constitutional Symptoms [facility]) CAMAY, PEDIGO ([id number]) Denies complaints or symptoms of Fever, Chills. Respiratory The patient has  no complaints or symptoms. Cardiovascular Complains or has symptoms of LE edema. Psychiatric The patient has no complaints or symptoms. Objective Constitutional Well-nourished and well-hydrated in no acute distress. Vitals Time Taken: 9:58 AM, Temperature: 98.0 F, Pulse: 70 bpm, Respiratory Rate: 16 breaths/min, Blood Pressure: 167/60 mmHg. Respiratory normal breathing without difficulty. clear to auscultation bilaterally. Cardiovascular regular rate and rhythm with normal S1, S2. Absent posterior tibial and dorsalis pedis pulses bilateral lower extremities. no clubbing, cyanosis, significant edema, Psychiatric this patient is able to make decisions and demonstrates good insight into disease process. Alert and Oriented x 3. pleasant and cooperative. General Notes: Patient's wound on the lateral portion of her foot continues to have slough noted in the wound bed unfortunately. However no debridement was performed due to patient's vascular status. Integumentary (Hair, Skin) Wound #6 status is Open. Original cause of wound was Gradually  Appeared. The wound is located on the [employer]. The wound measures 0.6cm length x 0.2cm width x 0.1cm depth; 0.094cm^2 area and 0.009cm^3 volume. There is no tunneling or undermining noted. There is a large amount of serous drainage noted. The wound margin is distinct with the outline attached to the wound base. There is large (67-100%) red granulation within the wound bed. There is a small (1-33%) amount of necrotic tissue within the wound bed including Eschar. Periwound temperature was noted as No Abnormality. The periwound has tenderness on palpation. Wound #7 status is Open. Original cause of wound was Gradually Appeared. The wound is located on the Right,Dorsal Foot. The wound measures 0.8cm length x 0.9cm width x 0.2cm depth; 0.565cm^2 area and 0.113cm^3 volume. There is no tunneling or undermining noted. There is a large amount of serous drainage noted. The wound margin is distinct with the outline attached to the wound base. There is small (1-33%) pink granulation within the wound bed. There is a large (67-100%) amount of necrotic tissue within the wound bed including Adherent Slough. Periwound temperature was noted as No Abnormality. The periwound has tenderness on palpation. 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 Cleanse wound with mild soap and water May Shower, gently pat wound dry prior to applying new dressing. Wound #7 Right,Dorsal Foot: Clean wound with Normal Saline. Cleanse wound with mild soap and water May Shower, gently pat wound dry prior to  applying new dressing. Anesthetic (add to Medication List): Wound #6 Right,Medial Toe Great: Topical Lidocaine 4% cream applied to wound bed prior to debridement (In Clinic Only). Wound #7 Right,Dorsal Foot: Topical Lidocaine 4% cream applied to wound bed prior to debridement (In Clinic Only). Skin Barriers/Peri-Wound Care: Wound #7 Right,Dorsal Foot: Skin Prep Primary Wound Dressing: Wound #6 Right,Medial Toe Great: Prisma Ag Wound #7 Right,Dorsal Foot: Santyl Ointment Secondary Dressing: Wound #6 Right,Medial Toe Great: Other - coverlet Wound #7 Right,Dorsal Foot: Dry Gauze Tegaderm Dressing Change Frequency: Wound #6 Right,Medial Toe Great: Change dressing every day. Wound #7 Right,Dorsal Foot: Change dressing every day. Follow-up Appointments: Wound #6 Right,Medial Toe Great: Return Appointment in 1 week. Wound #7 Right,Dorsal Foot: Return Appointment in 1 week. Edema Control: Wound #6 Right,Medial Toe Great: Elevate legs to the level of  the heart and pump ankles as often as possible Bour, Ronnesha ([id number]) Wound #7 Right,Dorsal Foot: Elevate legs to the level of the heart and pump ankles as often as possible Off-Loading: Wound #6 Right,Medial Toe Great: Turn and reposition every 2 hours Wound #7 Right,Dorsal Foot: Turn and reposition every 2 hours Additional Orders / Instructions: Wound #6 Right,Medial Toe Great: Increase protein intake. Other: - Vitamin C, Zinc Wound #7 Right,Dorsal Foot: Increase protein intake. Other: - Vitamin C, Zinc Consults ordered were: Vascular - [facility] The following medication(s) was prescribed: lidocaine topical 4 % cream 1 1 cream topical was prescribed at facility At this point I'm gonna suggest that we continue with the Current wound care measures although I do want to have patient go back for evaluation with vascular for potential intervention or least discussion of what that intervention would entail. They are in agreement  with this plan. We will subtly see her for reevaluation in one weeks time to see were things stand at that point. Please see above for specific wound care orders. We will see patient for re-evaluation in 1 week(s) here in the clinic. If anything worsens or changes patient will contact our office for additional recommendations. Electronic Signature(s) Signed: 11/06/2017 1:29:11 PM By: Worthy Keeler PA-C Entered By: Worthy Keeler on 11/06/2017 10:53:00 [name] ([id number]) -------------------------------------------------------------------------------- ROS/PFSH Details Patient Name: [name] Date of Service: 11/06/2017 9:45 AM Medical Record Number: [id number] Patient Account Number: [id number] Date of Birth/Sex: [birth year] (84 y.o. Female) Treating RN: Carolyne Fiscal, Debi Primary Care Provider: PATIENT, NO Other Clinician: Referring Provider: Referral, Self Treating Provider/Extender: STONE III, HOYT Weeks in Treatment: 2 Information Obtained From Patient Wound History Do you currently have one or more open woundso Yes How many open wounds do you currently haveo 1 Approximately how long have you had your woundso 1.5 weeks How have you been treating your wound(s) until nowo bandaide Has your wound(s) ever healed and then re-openedo No Have you had any lab work done in the past montho No Have you tested positive for an antibiotic resistant organism (MRSA, VRE)o No Have you tested positive for osteomyelitis (bone infection)o No Have you had any tests for circulation on your legso Yes [facility] Have you had other problems associated with your woundso Swelling Constitutional Symptoms (General Health) Complaints and Symptoms: Negative for: Fever; Chills Cardiovascular Complaints and Symptoms: Positive for: LE edema Medical History: Positive for: Hypertension Eyes Medical History: Positive for: Cataracts - had surgery Respiratory Complaints and  Symptoms: No Complaints or Symptoms Musculoskeletal Medical History: Positive for: Osteoarthritis Oncologic Medical History: Negative for: Received Chemotherapy; Received Radiation Psychiatric JACKLINE, CASTILLA ([id number]) Complaints and Symptoms: No Complaints or Symptoms HBO Extended History Items Eyes: Cataracts Immunizations Pneumococcal Vaccine: Received Pneumococcal Vaccination: Yes Implantable Devices Family and Social History Cancer: No; Diabetes: Yes - Mother; Heart Disease: Yes - Siblings; Hereditary Spherocytosis: No; Hypertension: No; Kidney Disease: No; Lung Disease: No; Seizures: No; Stroke: Yes - Siblings; Thyroid Problems: No; Tuberculosis: No; Never smoker; Marital Status - Widowed; Alcohol Use: Never; Drug Use: No History; Caffeine Use: Daily; Financial Concerns: No; Food, Clothing or Shelter Needs: No; Support System Lacking: No; Transportation Concerns: No; Advanced Directives: No; Patient does not want information on Advanced Directives; Do not resuscitate: No; Living Will: No; Medical Power of Attorney: No Physician Affirmation I have reviewed and agree with the above information. Electronic Signature(s) Signed: 11/06/2017 1:29:11 PM By: Worthy Keeler PA-C Signed: 11/06/2017 5:10:21 PM By: Alric Quan Entered  By: Worthy Keeler on 11/06/2017 10:51:35 [name] ([id number]) -------------------------------------------------------------------------------- SuperBill Details Patient Name: [name] Date of Service: 11/06/2017 Medical Record Number: [id number] Patient Account Number: [id number] Date of Birth/Sex: March 12, 1919 (84 y.o. 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 I10 Essential (primary) hypertension Facility Procedures CPT4 Code: [id number] Description: 99213 - WOUND CARE VISIT-LEV 3 EST PT Modifier: Quantity: 1 Physician Procedures CPT4 Code Description: 1324401 02725 - WC PHYS LEVEL 3 - EST PT ICD-10 Diagnosis Description I70.235 Atherosclerosis of native arteries of right leg with ulceration o I70.232 Atherosclerosis of native arteries of right leg with ulceration o L97.512  Non-pressure chronic ulcer of other part of right foot with fat l I10 Essential (primary) hypertension Modifier: f other part of fo f calf ayer exposed Quantity: 1 ot Electronic Signature(s) Signed: 11/06/2017 1:35:03 PM By: Alric Quan Previous Signature: 11/06/2017 1:29:11 PM Version By: Worthy Keeler PA-C Entered By: Alric Quan on 11/06/2017 13:35:03

## 2017-11-14 ENCOUNTER — Ambulatory Visit: Payer: Medicare PPO | Admitting: Cardiovascular Disease

## 2017-11-14 ENCOUNTER — Encounter: Payer: Medicare PPO | Attending: Physician Assistant | Admitting: Physician Assistant

## 2017-11-14 ENCOUNTER — Encounter: Payer: Self-pay | Admitting: Cardiovascular Disease

## 2017-11-14 ENCOUNTER — Other Ambulatory Visit
Admission: RE | Admit: 2017-11-14 | Discharge: 2017-11-14 | Disposition: A | Discharge status: Home / Self Care | Payer: Medicare PPO | Source: Ambulatory Visit | Attending: Cardiovascular Disease | Admitting: Cardiovascular Disease

## 2017-11-14 VITALS — BP 144/60 | HR 76 | Ht 63.0 in | Wt 152.0 lb

## 2017-11-14 DIAGNOSIS — Z01812 Encounter for preprocedural laboratory examination: Secondary | ICD-10-CM | POA: Diagnosis present

## 2017-11-14 DIAGNOSIS — E785 Hyperlipidemia, unspecified: Secondary | ICD-10-CM | POA: Diagnosis not present

## 2017-11-14 DIAGNOSIS — I480 Paroxysmal atrial fibrillation: Secondary | ICD-10-CM | POA: Diagnosis not present

## 2017-11-14 DIAGNOSIS — L97512 Non-pressure chronic ulcer of other part of right foot with fat layer exposed: Secondary | ICD-10-CM | POA: Diagnosis present

## 2017-11-14 DIAGNOSIS — Z01818 Encounter for other preprocedural examination: Secondary | ICD-10-CM

## 2017-11-14 DIAGNOSIS — I10 Essential (primary) hypertension: Secondary | ICD-10-CM | POA: Diagnosis not present

## 2017-11-14 DIAGNOSIS — I739 Peripheral vascular disease, unspecified: Secondary | ICD-10-CM

## 2017-11-14 DIAGNOSIS — I70235 Atherosclerosis of native arteries of right leg with ulceration of other part of foot: Secondary | ICD-10-CM | POA: Diagnosis not present

## 2017-11-14 LAB — CBC
HCT: 40.8 % (ref 35.0–47.0)
Hemoglobin: 13.4 g/dL (ref 12.0–16.0)
MCH: 29.8 pg (ref 26.0–34.0)
MCHC: 32.9 g/dL (ref 32.0–36.0)
MCV: 90.6 fL (ref 80.0–100.0)
PLATELETS: 326 10*3/uL (ref 150–440)
RBC: 4.5 MIL/uL (ref 3.80–5.20)
RDW: 14.4 % (ref 11.5–14.5)
WBC: 6.5 10*3/uL (ref 3.6–11.0)

## 2017-11-14 LAB — BASIC METABOLIC PANEL
Anion gap: 9 (ref 5–15)
BUN: 17 mg/dL (ref 6–20)
CALCIUM: 9.7 mg/dL (ref 8.9–10.3)
CHLORIDE: 109 mmol/L (ref 101–111)
CO2: 25 mmol/L (ref 22–32)
CREATININE: 0.84 mg/dL (ref 0.44–1.00)
GFR calc non Af Amer: 57 mL/min — ABNORMAL LOW (ref >60)
Glucose, Bld: 136 mg/dL — ABNORMAL HIGH (ref 65–99)
Potassium: 4.4 mmol/L (ref 3.5–5.1)
SODIUM: 143 mmol/L (ref 135–145)

## 2017-11-14 LAB — PROTIME-INR
INR: 0.99
PROTHROMBIN TIME: 13 s (ref 11.4–15.2)

## 2017-11-14 NOTE — Progress Notes (Signed)
Cardiology Office Note   Date:  11/14/2017   ID:  [name], DOB March 31, 1919, MRN [id number]  PCP:  Patient, No Pcp Per  Cardiologist:   [name], MD   Chief Complaint  Patient presents with  . other    Wound care. Meds reviewed verbally with pt.       History of Present Illness: [name] is a 84 y.o. female who Is here today for a follow-up visit regarding peripheral arterial disease. The patient has no previous cardiac history and no history of diabetes or tobacco use. She is known to have hypertension, hyperlipidemia and hypothyroidism.   She was seen in 2017 for nonhealing wounds on the left foot.  Noninvasive vascular evaluation showed an ABI of 0.55 on the right and 0.40 on the left. Duplex showed relatively long occlusion of the mid to distal left SFA with two-vessel runoff below the knee. Angiography in August, 2017 showed no significant aortoiliac disease. There was long occlusion of the left SFA with 1 vessel runoff below the knee via the peroneal artery which was occluded but gave collaterals to the dorsalis pedis. I performed successful angioplasty of the left SFA followed by drug-coated balloon angioplasty and spot stenting with self-expanding stent in the midsegment. The ulcers on the left foot has healed completely.   She now presents with nonhealing ulceration on the right foot with severe rest pain.  She has a deep ulcer on the right lateral foot as well as a superficial ulcer on the big toe.  She has been going to the wound clinic with no significant improvement.  She has severe pain especially at night.  No symptoms on the left side. She underwent a vascular studies which showed severely reduced ABI on the right 0.4 with evidence of high-grade stenosis in the right distal SFA or occlusion.    Past Medical History:  Diagnosis Date  . Arthritis    "legs" 05/18/2016)  . Breast cancer, right breast ([place])    mastectomy 30 yr ago  . DVT (deep venous  thrombosis) ([place])    "found an old clot in her ?left leg when they were doing vascular studies; put her on ASA" (05/18/2016)  . Family history of adverse reaction to anesthesia    "all the family get PONV" (05/18/2016)  . Hypertension   . Hypothyroidism   . kidney calculi   . PAD (peripheral artery disease) ([place])   . PONV (postoperative nausea and vomiting)   . Shortness of breath     Past Surgical History:  Procedure Laterality Date  . BALLOON ANGIOPLASTY, ARTERY Left 05/18/2016   SFA/notes 05/18/2016  . BREAST BIOPSY Right   . CATARACT EXTRACTION W/ INTRAOCULAR LENS  IMPLANT, BILATERAL Bilateral 2014  . CYSTOSCOPY W/ LITHOLAPAXY / EHL    . CYSTOSCOPY W/ URETERAL STENT PLACEMENT  01/17/2012   Procedure: CYSTOSCOPY WITH RETROGRADE PYELOGRAM/URETERAL STENT PLACEMENT;  Surgeon: Franchot Gallo, MD;  Location: [facility];  Service: Urology;  Laterality: Left;  . CYSTOSCOPY W/ URETERAL STENT PLACEMENT  11/10/2010   Archie Endo 11/10/2010  . CYSTOSCOPY W/ URETERAL STENT REMOVAL  11/22/2010   Archie Endo 11/22/2010  . EYE SURGERY    . FRACTURE SURGERY    . MASTECTOMY COMPLETE / SIMPLE Right   . PERIPHERAL VASCULAR CATHETERIZATION N/A 05/18/2016   Procedure: Abdominal Aortogram w/Lower Extremity;  Surgeon: Wellington Hampshire, MD;  Location: [facility];  Service: Cardiovascular;  Laterality: N/A;  . WRIST FRACTURE SURGERY Left  Current Outpatient Medications  Medication Sig Dispense Refill  . amLODipine (NORVASC) 5 MG tablet Take 5 mg by mouth daily with breakfast.     . cetirizine (ZYRTEC) 10 MG tablet Take 10 mg by mouth daily.    . clopidogrel (PLAVIX) 75 MG tablet Take 1 tablet (75 mg total) by mouth daily with breakfast. 30 tablet 5  . levothyroxine (SYNTHROID, LEVOTHROID) 50 MCG tablet Take 50 mcg by mouth daily with breakfast.     . lidocaine-prilocaine (EMLA) cream Apply 1 application topically as needed. Dressing change    . losartan (COZAAR) 50 MG tablet Take 50 mg by mouth daily with  breakfast.     . nortriptyline (PAMELOR) 25 MG capsule Take 1 capsule (25 mg total) by mouth at bedtime. 30 capsule 1  . traMADol (ULTRAM) 50 MG tablet Take 50 mg by mouth at bedtime.      No current facility-administered medications for this visit.     Allergies:   Codeine    Social History:  The patient  reports that  has never smoked. she has never used smokeless tobacco. She reports that she does not drink alcohol or use drugs.   Family History:  The patient's Family history is remarkable for coronary artery disease.   ROS:  Please see the history of present illness.   Otherwise, review of systems are positive for none.   All other systems are reviewed and negative.    PHYSICAL EXAM: VS:  BP (!) 144/60 (BP Location: Left Arm, Patient Position: Sitting, Cuff Size: Normal)   Pulse 76   Ht 5\' 3"  (1.6 m)   Wt 152 lb (68.9 kg)   BMI 26.93 kg/m  , BMI Body mass index is 26.93 kg/m. GEN: Well nourished, well developed, in no acute distress  HEENT: normal  Neck: no JVD, carotid bruits, or masses Cardiac: Irregularly irregular; no murmurs, rubs, or gallops,no edema  Respiratory:  clear to auscultation bilaterally, normal work of breathing GI: soft, nontender, nondistended, + BS MS: no deformity or atrophy  Skin: warm and dry, no rash Neuro:  Strength and sensation are intact Psych: euthymic mood, full affect Vascular: Femoral pulses are normal bilaterally. Distal pulses are not palpable.   EKG:  EKG is  ordered today. EKG showed atrial fibrillation with ventricular rate of 76 bpm.  Possible old anterior infarct.  Recent Labs: No results found for requested labs within last 8760 hours.    Lipid Panel No results found for: CHOL, TRIG, HDL, CHOLHDL, VLDL, LDLCALC, LDLDIRECT    Wt Readings from Last 3 Encounters:  11/14/17 152 lb (68.9 kg)  02/06/17 144 lb 4 oz (65.4 kg)  11/24/16 147 lb (66.7 kg)      Other studies Reviewed: Additional studies/ records that were  reviewed today include: Office note from the wound center.    ASSESSMENT AND PLAN:  1.  Peripheral arterial disease: Critical limb ischemia affecting the right foot with nonhealing ulceration and severe rest pain.  The patient is at high risk for limb loss .  No symptoms on the left side status post successful left SFA revascularization in 2017. I discussed management options with patient and family considering her age.  I think there is little chance for the wounds to heal without addressing blood flow.  The patient is highly symptomatic with severe pain.  Thus, I think the best option is to proceed with angiography and possible endovascular intervention.  I explained to them that complication rate is higher in her  age group and they do understand that.  We will plan on proceeding next week.  2. Essential hypertension: Blood pressure is reasonably controlled  3. Hyperlipidemia: She has not been on a statin given her advanced age.  4.  Atrial fibrillation: This seems to be a new diagnosis.  She does not appear to be symptomatic.  I am not starting anticoagulation at the present time given that she is on antiplatelet medication.  Disposition:   FU with me in 1 month  Signed,  [name], MD  11/14/2017 4:41 PM    [facility]

## 2017-11-14 NOTE — H&P (View-Only) (Signed)
Cardiology Office Note   Date:  11/14/2017   ID:  [name], DOB 02-13-1919, MRN [id number]  PCP:  Patient, No Pcp Per  Cardiologist:   [name], MD   Chief Complaint  Patient presents with  . other    Wound care. Meds reviewed verbally with pt.       History of Present Illness: [name] is a 84 y.o. female who Is here today for a follow-up visit regarding peripheral arterial disease. The patient has no previous cardiac history and no history of diabetes or tobacco use. She is known to have hypertension, hyperlipidemia and hypothyroidism.   She was seen in 2017 for nonhealing wounds on the left foot.  Noninvasive vascular evaluation showed an ABI of 0.55 on the right and 0.40 on the left. Duplex showed relatively long occlusion of the mid to distal left SFA with two-vessel runoff below the knee. Angiography in August, 2017 showed no significant aortoiliac disease. There was long occlusion of the left SFA with 1 vessel runoff below the knee via the peroneal artery which was occluded but gave collaterals to the dorsalis pedis. I performed successful angioplasty of the left SFA followed by drug-coated balloon angioplasty and spot stenting with self-expanding stent in the midsegment. The ulcers on the left foot has healed completely.   She now presents with nonhealing ulceration on the right foot with severe rest pain.  She has a deep ulcer on the right lateral foot as well as a superficial ulcer on the big toe.  She has been going to the wound clinic with no significant improvement.  She has severe pain especially at night.  No symptoms on the left side. She underwent a vascular studies which showed severely reduced ABI on the right 0.4 with evidence of high-grade stenosis in the right distal SFA or occlusion.    Past Medical History:  Diagnosis Date  . Arthritis    "legs" 05/18/2016)  . Breast cancer, right breast ([place])    mastectomy 30 yr ago  . DVT (deep venous  thrombosis) ([place])    "found an old clot in her ?left leg when they were doing vascular studies; put her on ASA" (05/18/2016)  . Family history of adverse reaction to anesthesia    "all the family get PONV" (05/18/2016)  . Hypertension   . Hypothyroidism   . kidney calculi   . PAD (peripheral artery disease) ([place])   . PONV (postoperative nausea and vomiting)   . Shortness of breath     Past Surgical History:  Procedure Laterality Date  . BALLOON ANGIOPLASTY, ARTERY Left 05/18/2016   SFA/notes 05/18/2016  . BREAST BIOPSY Right   . CATARACT EXTRACTION W/ INTRAOCULAR LENS  IMPLANT, BILATERAL Bilateral 2014  . CYSTOSCOPY W/ LITHOLAPAXY / EHL    . CYSTOSCOPY W/ URETERAL STENT PLACEMENT  01/17/2012   Procedure: CYSTOSCOPY WITH RETROGRADE PYELOGRAM/URETERAL STENT PLACEMENT;  Surgeon: Franchot Gallo, MD;  Location[facility];  Service: Urology;  Laterality: Left;  . CYSTOSCOPY W/ URETERAL STENT PLACEMENT  11/10/2010   Archie Endo 11/10/2010  . CYSTOSCOPY W/ URETERAL STENT REMOVAL  11/22/2010   Archie Endo 11/22/2010  . EYE SURGERY    . FRACTURE SURGERY    . MASTECTOMY COMPLETE / SIMPLE Right   . PERIPHERAL VASCULAR CATHETERIZATION N/A 05/18/2016   Procedure: Abdominal Aortogram w/Lower Extremity;  Surgeon: Wellington Hampshire, MD;  Location: [facility];  Service: Cardiovascular;  Laterality: N/A;  . WRIST FRACTURE SURGERY Left  Current Outpatient Medications  Medication Sig Dispense Refill  . amLODipine (NORVASC) 5 MG tablet Take 5 mg by mouth daily with breakfast.     . cetirizine (ZYRTEC) 10 MG tablet Take 10 mg by mouth daily.    . clopidogrel (PLAVIX) 75 MG tablet Take 1 tablet (75 mg total) by mouth daily with breakfast. 30 tablet 5  . levothyroxine (SYNTHROID, LEVOTHROID) 50 MCG tablet Take 50 mcg by mouth daily with breakfast.     . lidocaine-prilocaine (EMLA) cream Apply 1 application topically as needed. Dressing change    . losartan (COZAAR) 50 MG tablet Take 50 mg by mouth daily with  breakfast.     . nortriptyline (PAMELOR) 25 MG capsule Take 1 capsule (25 mg total) by mouth at bedtime. 30 capsule 1  . traMADol (ULTRAM) 50 MG tablet Take 50 mg by mouth at bedtime.      No current facility-administered medications for this visit.     Allergies:   Codeine    Social History:  The patient  reports that  has never smoked. she has never used smokeless tobacco. She reports that she does not drink alcohol or use drugs.   Family History:  The patient's Family history is remarkable for coronary artery disease.   ROS:  Please see the history of present illness.   Otherwise, review of systems are positive for none.   All other systems are reviewed and negative.    PHYSICAL EXAM: VS:  BP (!) 144/60 (BP Location: Left Arm, Patient Position: Sitting, Cuff Size: Normal)   Pulse 76   Ht 5\' 3"  (1.6 m)   Wt 152 lb (68.9 kg)   BMI 26.93 kg/m  , BMI Body mass index is 26.93 kg/m. GEN: Well nourished, well developed, in no acute distress  HEENT: normal  Neck: no JVD, carotid bruits, or masses Cardiac: Irregularly irregular; no murmurs, rubs, or gallops,no edema  Respiratory:  clear to auscultation bilaterally, normal work of breathing GI: soft, nontender, nondistended, + BS MS: no deformity or atrophy  Skin: warm and dry, no rash Neuro:  Strength and sensation are intact Psych: euthymic mood, full affect Vascular: Femoral pulses are normal bilaterally. Distal pulses are not palpable.   EKG:  EKG is  ordered today. EKG showed atrial fibrillation with ventricular rate of 76 bpm.  Possible old anterior infarct.  Recent Labs: No results found for requested labs within last 8760 hours.    Lipid Panel No results found for: CHOL, TRIG, HDL, CHOLHDL, VLDL, LDLCALC, LDLDIRECT    Wt Readings from Last 3 Encounters:  11/14/17 152 lb (68.9 kg)  02/06/17 144 lb 4 oz (65.4 kg)  11/24/16 147 lb (66.7 kg)      Other studies Reviewed: Additional studies/ records that were  reviewed today include: Office note from the wound center.    ASSESSMENT AND PLAN:  1.  Peripheral arterial disease: Critical limb ischemia affecting the right foot with nonhealing ulceration and severe rest pain.  The patient is at high risk for limb loss .  No symptoms on the left side status post successful left SFA revascularization in 2017. I discussed management options with patient and family considering her age.  I think there is little chance for the wounds to heal without addressing blood flow.  The patient is highly symptomatic with severe pain.  Thus, I think the best option is to proceed with angiography and possible endovascular intervention.  I explained to them that complication rate is higher in her  age group and they do understand that.  We will plan on proceeding next week.  2. Essential hypertension: Blood pressure is reasonably controlled  3. Hyperlipidemia: She has not been on a statin given her advanced age.  4.  Atrial fibrillation: This seems to be a new diagnosis.  She does not appear to be symptomatic.  I am not starting anticoagulation at the present time given that she is on antiplatelet medication.  Disposition:   FU with me in 1 month  Signed,  [name], MD  11/14/2017 4:41 PM    [facility]

## 2017-11-14 NOTE — Patient Instructions (Addendum)
Medication Instructions:  Your physician recommends that you continue on your current medications as directed. Please refer to the Current Medication list given to you today.   Labwork: BMET, CBC, PT/INR at the [facility] lab. No appointment needed.  Testing/Procedures: Abdominal aortogram with right lower extremity angiography Wednesday, February 13 [facility] Entrance A, Short Stay [address]  [phone]  Nothing to eat or drink after midnight the evening before your procedure. Arrival time  8:30am  You may pack and overnight bag in case you stay one night.    Follow-Up: Your physician recommends that you schedule a follow-up appointment in: 1 month with Dr. Fletcher Anon.    Any Other Special Instructions Will Be Listed Below (If Applicable).     If you need a refill on your cardiac medications before your next appointment, please call your pharmacy.

## 2017-11-15 ENCOUNTER — Telehealth: Payer: Self-pay | Admitting: Cardiovascular Disease

## 2017-11-15 NOTE — Telephone Encounter (Signed)
Spoke with daughter [name] to schedule appt  She declined appt with APP staff   Scheduled next available 3/26 at 4 which is further out than 1 month

## 2017-11-15 NOTE — Telephone Encounter (Signed)
S/w daughter, [name], who is agreeable to March 8 at 3:20pm.  Appointment updated with Marissa Calamity in scheduling

## 2017-11-15 NOTE — Telephone Encounter (Signed)
Pt had office visit yesterday and left after 5pm; scheduling was closed. Patient needs a one month follow up with Dr. Fletcher Anon. Routed to Support Pool to contact one of patient's daughters.

## 2017-11-17 ENCOUNTER — Encounter (INDEPENDENT_AMBULATORY_CARE_PROVIDER_SITE_OTHER): Payer: Medicare PPO | Admitting: Ophthalmology

## 2017-11-17 DIAGNOSIS — H43813 Vitreous degeneration, bilateral: Secondary | ICD-10-CM | POA: Diagnosis not present

## 2017-11-17 DIAGNOSIS — H353231 Exudative age-related macular degeneration, bilateral, with active choroidal neovascularization: Secondary | ICD-10-CM

## 2017-11-17 DIAGNOSIS — I10 Essential (primary) hypertension: Secondary | ICD-10-CM | POA: Diagnosis not present

## 2017-11-17 DIAGNOSIS — H35033 Hypertensive retinopathy, bilateral: Secondary | ICD-10-CM

## 2017-11-20 NOTE — Progress Notes (Signed)
DAYLAH, SAYAVONG ([id number]) Visit Report for 11/14/2017 Chief Complaint Document Details Patient Name: [name], [name] Date of Service: 11/14/2017 8:45 AM Medical Record Number: [id number] Patient Account Number: [id number] Date of Birth/Sex: 1919-08-10 (84 y.o. Female) Treating RN: Carolyne Fiscal, Debi Primary Care Provider: PATIENT, NO Other Clinician: Referring Provider: Referral, Self Treating Provider/Extender: STONE III, Naphtali Zywicki Weeks in Treatment: 3 Information Obtained from: Patient Chief Complaint Right foot Ulcers Electronic Signature(s) Signed: 11/14/2017 1:41:25 PM By: Worthy Keeler PA-C Entered By: Worthy Keeler on 11/14/2017 08:52:35 [name] ([id number]) -------------------------------------------------------------------------------- HPI Details Patient Name: [name] Date of Service: 11/14/2017 8:45 AM Medical Record Number: [id number] Patient Account Number: [id number] Date of Birth/Sex: 02-09-1919 (84 y.o. Female) Treating RN: Carolyne Fiscal, Debi Primary Care Provider: PATIENT, NO Other Clinician: Referring Provider: Referral, Self Treating Provider/Extender: STONE III, Garrett Mitchum Weeks in Treatment: 3 History of Present Illness HPI Description: 04/06/16; this is a 84 year old woman who arrives accompanied by 2 daughters for a wound on her left ankle and her left fifth toe. These have apparently been present for a year. I'm not quite certain how she came to this clinic however she was being followed by Sharlotte Alamo her podiatrist for these wounds. She was also referred to [facility] and they apparently did a test presumably arterial studies although we don't have any of these results and we couldn't get through to the office today. The family but has been applying a combination of Bactroban and a light bandage and perhaps more recently Silvadene cream. She did have an x-ray of the foot roughly 6 months ago at the podiatry office the family was unaware that  if there were any abnormalities. Apparently they have not seen any healing here. Our intake nurse noted a slight skin tear on the right anterior lower leg. Nobody seemed aware of this. ABIs calculated in this clinic was 0.3 on the right and 0.4 on the left I have reviewed things in [facility]. There is very little information on this patient. She apparently follows in current [facility] clinic which we don't have information from. She has mentioned already been to a [facility]. She has a history of hypothyroidism, nephrolithiasis arthritis and has had a previous mastectomy. 04/13/16; patient's x-ray was normal. She is already been to see Dr. Delana Meyer vascular surgery. Her arterial exam was from November 2016 this showed a left ABI of 0.62 her right of 0.76. Her duplex ultrasound of the left leg showed biphasic waves in the common femoral and distal femoral artery however monophasic waves in the superficial femoral artery proximal and mid biphasic it distal. 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 The patient tells me that she has pain at night when she tries to lie down which is improved by getting up and sitting in the chair this sounds like claudication at rest. Her wounds are on the left medial malleolus and the dorsal fifth toe small punched out wounds that are right on bone. We use Santyl last week 04/20/16: nurse informed me pt has declined evaluation for significant PAD. she denies systemic s/s of infections. 04/27/16;; the patient has had noninvasive arterial studies done in November 2016. ABI and the left was 0.62. Monophasic waves at the superficial femoral artery. Occluded to the posterior tibial artery. So had greater than 50% stenosis of the right superficial femoral and greater than 50% stenosis of the left superficial femoral artery. She had bilateral tibial peroneal artery disease. Both of the wounds on the left fifth toe and left lateral malleolus have  been present for more than a year. They  have been to see vein and vascular. The patient has some pain but miraculously I think the wounds have largely been stable. No evidence of infection 05/04/16; she goes for a noninvasive study tomorrow and then sees Dr. Fletcher Anon on Monday. By the time she is here next week we should have a better picture of whether something can be done with regards to her arterial flow. We continue to have ischemic-looking wounds on the left fifth toe and left lateral malleolus. 05/10/16; the patient went for her arterial studies and saw Dr. Fletcher Anon. As predicted she is felt to have critical limb ischemia. The feeling is that she has occlusion of the SFA. The feeling would she would be a candidate for a stent to the SFA. The patient did not make a decision to proceed with the procedure and she is here with family members to discuss this me today. He shouldn't has a lot of pain and cannot sleep and rest well at night per her family. 05/24/16; the patient went and had a complex revascularization/angioplasty of the left superficial femoral artery followed by drug-coated balloon angioplasty and spots stenting. She tolerated the procedure well. She was recommended for dual antiplatelet drugs with Plavix and aspirin for at least a month. She went yesterday for I believe follow-up serial Dopplers and ABIs although I don't see these results. The patient is unfortunately complaining of a lot of pain in her bilateral lower legs below the knees from the ankle to the knees. She apparently was prescribed lidocaine and apparently put this on her legs instead of over the wound areas. This may have something to do with it however there is a lot of edema in her bilateral legs I was able to find her arterial studies from yesterday. The left ABI has improved up to 0.66 post left SFA stent. The bilateral great toe indices remain abnormal with the left being in the 0.25 range. Duplex ultrasound showed her left SFA stent is patent monophasic  waveforms persist in the left leg 06/07/16; continued punched-out areas over the dorsal left fifth toe and left lateral malleolus. No major improvement 06/28/16; the areas over her dorsal left fifth toe and left lateral malleolus are covered in surface slough we are using Iodoflex 07/05/16. We have been using Iodoflex for 2-3 weeks now. I have not been debridement is because of pain ARNEISHA, KINCANNON ([id number]) 07/19/16 currently we have been using Iodoflex for roughly the past month. Previously we were unable to debride due to pain although today patient states that the pain is not nearly as severe as it has been in the past. In fact she rates this to be a 1 out of 10 and it worse to a to 10 with palpation of the wound. All and all her and her daughter feel like this is actually doing steadily better at this point in time. She is pleased with progress and we have been seeing her every 2 weeks. 08/02/16; this is a delightful 84 year old woman I have not seen in over a month. She has arterial insufficiency wounds remaining over the left lateral malleolus and the left fifth toe. With the help of Dr. Fletcher Anon we are able to get her revascularized on the left. The 2 wounds on the left have been making good progress and are definitely smaller especially the area over the left lateral malleolus. She is certainly in a lot less pain than she used to be although I still  think there is some claudication type pain. Unfortunately she is developed a area on the right lateral foot which is a small open area but I think is threatened. I suspect a small ischemic areas well. Also on her right fifth toe there is an area that is not open but looks as though it is receiving too much pressure from footwear. Finally an area over her right malleolus although I don't think this is on its way to anything ominous 08/17/16 patient presents today for follow up evaluation she tells me that she is really doing fairly well from a pain  standpoint compared to where she has been previous. She is tolerating the dressing changes without any complication. She continues to have discharge and drainage however. 08-30-16 Ms. Ingraham presents today with her daughter, she states that she continues to have intermittent shooting pains to the left lateral fifth toe at this site of seems to be a healed ulcer. She denies any other issues that her wound related since her last appointment. Her daughter states that she is in need of a tramadol refill at this time as she uses half a tablet at at bedtime to aid in sleep due to foot pain. Iodoflex has been used on all wounds in previous dressing changes. 09/13/16; the patient has ischemic wounds in her feet. The area over the left lateral malleolus is healed and the area over the left fifth toe looks improved.. She has an area on the lateral aspect of her right foot which is a small but deep wound. Finally she has a new open area on the medial aspect of the left medial malleolus. This is superficial 09/27/16; the area over the left lateral malleolus remains healed. The area over the left fifth toe has a surface and she states the pain is better but I don't think this is completely closed. She has an area on the lateral aspect of her right foot is a small but deep wound. The new open area on the medial aspect of the left ankle was closed from last time. 10/18/16; the area over her left lateral malleolus remains healed. The area over the left fifth toe has a surface over the top of this however there is no overt open area. Given the underlying issues of severe PAD and continued pain in the toe I would think it would be unlikely this is truly healed however I am not planning to debridement this area. The area on the right lateral foot which was a more recent wound is a small punched out painful area again has significant surface slough and nonviable tissue. It is clear the patient still has claudication  type pain however she remains functional. I have been giving her tramadol when necessary and that seems to help a lot with her pain the patient follows up with Dr. Fletcher Anon on January 18/18 11/01/16; patient missed her follow-up with Dr. Fletcher Anon last week due to a snow day. Follow-up is now on February 15. The areas on the right lateral malleolus and dorsal right fifth toe remain closed over. The fifth toe was tentative is there is a surface eschar however I'm not going to disturb this. Therefore, her only open areas on the right lateral foot. This is a small punched out area. We have been using Prisma. I'm quite convinced this is an ischemic wound 11/15/16; patient has follow-up with Dr. Fletcher Anon on February 15. She has no open wound on the left leg and doesn't really complain of claudication that  I can determine from talking to her. However on the right leg she clearly has some degree of claudication. The remaining open wound is on the right lateral foot. We have been using Iodosorb ointment 11/29/16; patient saw Dr. Fletcher Anon on 11/24/16. His comment is that her wound on her right lateral foot seems to be improving with local wound care. She has known significant right SFA disease. Her lower extremity Doppler will be repeated and according to the patient's daughter that appointment is on March 15. He is left with the thought that endovascular intervention in the right SFA might be necessary. The patient has quite a bit of pain especially at night related to the wound in her right foot. We changed her to Iodosorb ointment last week 12/13/16; this is a patient I follow every 2 weeks largely on a palliative approach at this point about ischemic wounds currently in the right foot. Initially she had them on her left lateral malleolus and left fifth toe. The area on the left lateral malleolus healed and the fifth toe has a surface eschar that I have elected not to remove. Both of these improved after revascularization by  Dr. Fletcher Anon. We have been using Iodosorb to the right lateral foot not much change here. 12/27/16; the patient had her arterial studies. This showed known bilateral SFA disease. Stable right ABI 0.5 to stable left ABI at 0.7 to the did not do it TBI on the right it was 0.61 on the left she has a stent in the long segment of her left SFA from 05/18/16. All of her wounds are somewhat better. She states her pain is better in the right foot is improved. 01/10/17- patient is here for follow-up dilation of her right lateral foot ulcer. She has been tolerating Iodosorb. She is voices no complaints or concerns 01/24/17; small ischemic wound on right lateral foot. still non viable cover. follows with Dr. Fletcher Anon on 4/30. No open area on left foot Tomasetti, Estill Bamberg ([id number]) 02/07/17 doing well and states she is pain free. Saw Dr. Fletcher Anon who said she is doing well and has "50% flow in the right leg and 70% in the left. According to her daughter he does not wish to do any further interventions 02/21/17; small ischemic wound on the right lateral foot. Per her daughter and the patient she has no open wound on the left foot and ankle which were her initial presenting wounds. still has claudication type pain at night she is been to see interventional cardiology who does not feel that she has a need for intervention on the right leg at present. She still has claudication type pain in the right leg which she manages at night with when necessary tramadol that I have prescribed 03/14/17; small ischemic wound on the right lateral foot. They've been dressing this at home with Iodosorb ointment. The patient does not complain of any pain. The wound has a surface eschar over it and there is really no visible open area. This is similar to how the areas on the left leg healed 04/11/17; the small ischemic wound on her right lateral foot is finally closed over. Small amount of eschar over the surface however I did not attempt to remove  this. The patient claims to be asymptomatic she is not having any claudication that I'm able to elicit although her activity is limited Readmission: 10/23/17 on evaluation today patient appears to be doing somewhat poorly in regard to her right foot where she has two ulcers at  this point. The worst is on the lateral aspect of her foot medial first metatarsal region is not nearly as bad. With that being said these did arise seemingly out of nowhere she has previously had a similar issue in the past these have always been ischemic wounds due to arterial insufficiency. She does see Dr. Fletcher Anon as her vascular specialist and it has been noted that her ABI's are somewhat low. She actually has a repeat evaluation coming up this March 2019 to reevaluate her blood flow. On the last check 12/22/16 it was noted that patient had stable ABI's in the lower moderate abnormal range in regard to the right in regard to the left and the upper moderate at normal range. Patient is having some discomfort though nothing severe at this point in time. She is seen with her daughter and son-in-law today. No fevers, chills, nausea, or vomiting noted at this time. 10/30/17 on evaluation today patient appears to be doing well in regard to her wounds. The right lateral foot wound appears to show signs of cleaning up nicely there is granulation noted underneath that the bed of the wound although there still some [facility] covering. Nonetheless she still has a lot of discomfort and I really do not want to proceed with debridement due to the fact that she does have so much discomfort. Good news is the tramadol has been helping her at bedtime she only takes this once a day and that has been extremely beneficial. Unfortunately she does not have a primary right now as she is awaiting a new one which is the reason that I am prescribing the tramadol. Nonetheless I'm pleased with how things seem to be progressing in regard to her  ulcers. 11/06/17 on evaluation today patient appears to be doing decently well as far as maintaining in regard to her ulcers. Unfortunately I did review her arterial study which shows that she has on the right arterial obstruction involving the superficial femoral and popliteal artery as well is the superficial femoral artery with a high grade stenosis versus inclusion. Collateral flow noted in the distal portion of the SFA. Severe progression is noted compared to previous study. Unfortunately being the patient's wounds do not seem to be healing as appropriately and quickly as we would like I do believe this is likely a result of poor vascular flow to the right lower extremity. This was discussed with patient and her daughter during the office visit today. 11/14/17 on evaluation today patient continues to have a fairly stalled ulcer in regard to her to ulcers on the right lower extremity. She obviously did have significant findings on her arterial study and I do believe this is again due to poor vascular flow. She does have an appointment later this afternoon with her vascular specialist for further evaluation to see if there any recommendations at that point. With that being said she continues to have discomfort which I do believe is arterial in nature in regard to insufficiency. Electronic Signature(s) Signed: 11/14/2017 1:41:25 PM By: Worthy Keeler PA-C Entered By: Worthy Keeler on 11/14/2017 11:59:09 [name] ([id number]) -------------------------------------------------------------------------------- Physical Exam Details Patient Name: [name] Date of Service: 11/14/2017 8:45 AM Medical Record Number: [id number] Patient Account Number: [id number] Date of Birth/Sex: October 20, 1918 (84 y.o. Female) Treating RN: Carolyne Fiscal, Debi Primary Care Provider: PATIENT, NO Other Clinician: Referring Provider: Referral, Self Treating Provider/Extender: STONE III, Kendyn Zaman Weeks in Treatment:  3 Constitutional Thin and well-hydrated in no acute distress. Respiratory normal breathing without difficulty.  clear to auscultation bilaterally. Cardiovascular regular rate and rhythm with normal S1, S2. 1+ dorsalis pedis/posterior tibialis pulses. Psychiatric this patient is able to make decisions and demonstrates good insight into disease process. Alert and Oriented x 3. pleasant and cooperative. Notes At this point patient's wound beds appear to still be slough covered unfortunately. There really is no significant change since prior evaluation again no sharp debridement have been performed due to the fact that patient has poor vascular flow as far as her arterial status of concern. Electronic Signature(s) Signed: 11/14/2017 1:41:25 PM By: Worthy Keeler PA-C Entered By: Worthy Keeler on 11/14/2017 12:00:06 [name] ([id number]) -------------------------------------------------------------------------------- Physician Orders Details Patient Name: [name] Date of Service: 11/14/2017 8:45 AM Medical Record Number: [id number] Patient Account Number: [id number] Date of Birth/Sex: 06/09/1919 (84 y.o. Female) Treating RN: Cornell Barman Primary Care Provider: PATIENT, NO Other Clinician: Referring Provider: Referral, Self Treating Provider/Extender: Melburn Hake, Makisha Marrin Weeks in Treatment: 3 Verbal / Phone Orders: Yes Clinician: Cornell Barman Read Back and Verified: Yes Diagnosis Coding ICD-10 Coding Code Description I70.235 Atherosclerosis of native arteries of right leg with ulceration of other part of foot I70.232 Atherosclerosis of native arteries of right leg with ulceration of calf L97.512 Non-pressure chronic ulcer of other part of right foot with fat layer exposed I10 Essential (primary) hypertension Wound Cleansing Wound #6 Right,Medial Toe Great o Clean wound with Normal Saline. o Cleanse wound with mild soap and water o May Shower, gently pat wound dry prior  to applying new dressing. Wound #7 Right,Dorsal Foot o Clean wound with Normal Saline. o Cleanse wound with mild soap and water o May Shower, gently pat wound dry prior to applying new dressing. Anesthetic (add to Medication List) Wound #6 Right,Medial Toe Great o Topical Lidocaine 4% cream applied to wound bed prior to debridement (In Clinic Only). Wound #7 Right,Dorsal Foot o Topical Lidocaine 4% cream applied to wound bed prior to debridement (In Clinic Only). Skin Barriers/Peri-Wound Care Wound #7 Right,Dorsal Foot o Skin Prep Primary Wound Dressing Wound #6 Right,Medial Toe Great o Iodosorb Ointment Wound #7 Right,Dorsal Foot o Iodosorb Ointment Secondary Dressing Wound #6 Right,Medial Toe Great o Other - coverlet Wound #7 Right,Dorsal Foot o Dry Gauze Belk, Wylee ([id number]) o Tegaderm Dressing Change Frequency Wound #6 Right,Medial Toe Great o Change dressing every day. Wound #7 Right,Dorsal Foot o Change dressing every day. Follow-up Appointments Wound #6 Right,Medial Toe Great o Return Appointment in 1 week. Wound #7 Right,Dorsal Foot o Return Appointment in 1 week. Edema Control Wound #6 Right,Medial Toe Great o Elevate legs to the level of the heart and pump ankles as often as possible Wound #7 Right,Dorsal Foot o Elevate legs to the level of the heart and pump ankles as often as possible Off-Loading Wound #6 Right,Medial Toe Great o Turn and reposition every 2 hours Wound #7 Right,Dorsal Foot o Turn and reposition every 2 hours Additional Orders / Instructions Wound #6 Right,Medial Toe Great o Increase protein intake. o Other: - Vitamin C, Zinc Wound #7 Right,Dorsal Foot o Increase protein intake. o Other: - Vitamin C, Zinc Patient Medications Allergies: codeine Notifications Medication Indication Start End lidocaine DOSE 1 - topical 4 % cream - 1 cream topical Electronic Signature(s) Signed:  11/14/2017 1:41:25 PM By: Worthy Keeler PA-C Signed: 11/20/2017 11:05:32 AM By: Gretta Cool, BSN, RN, CWS, Kim RN, BSN Entered By: Gretta Cool, BSN, RN, CWS, Kim on 11/14/2017 09:18:45 [name] ([id number]) BRENDIA, DAMPIER ([id number]) -------------------------------------------------------------------------------- Prescription 11/14/2017 Patient Name: [name]  Provider: Worthy Keeler PA-C Date of Birth: 08/13/1919 NPI#: [id number] Sex: F DEA#: [id number] Phone #: [phone] License #: Patient Address: [facility] [place], [place] [address] [address], [place], [place] [address] [phone] Allergies codeine Reaction: sick on stomach Severity: Severe Medication Medication: Route: Strength: Form: lidocaine topical 4% cream Class: TOPICAL LOCAL ANESTHETICS Dose: Frequency / Time: Indication: 1 1 cream topical Number of Refills: Number of Units: 0 Generic Substitution: Start Date: End Date: Administered at [place]: Yes Time Administered: Time Discontinued: Note to Pharmacy: Signature(s): Date(s): Electronic Signature(s) Signed: 11/14/2017 1:41:25 PM By: Worthy Keeler PA-C Signed: 11/20/2017 11:05:32 AM By: Gretta Cool, BSN, RN, CWS, Kim RN, BSN [place], Narka ([id number]) Entered By: Gretta Cool, BSN, RN, CWS, Kim on 11/14/2017 09:18:46 [name] ([id number]) --------------------------------------------------------------------------------  Problem List Details Patient Name: [name] Date of Service: 11/14/2017 8:45 AM Medical Record Number: [id number] Patient Account Number: [id number] Date of Birth/Sex: [birth year] (84 y.o. 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 10/23/2017 Yes L97.512 Non-pressure chronic ulcer of other part of right foot with fat layer 10/23/2017 Yes exposed [place] (primary) hypertension 10/24/2017 Yes Inactive Problems Resolved Problems Electronic Signature(s) Signed: 11/14/2017 1:41:25 PM By: Worthy Keeler PA-C Entered By: Worthy Keeler on 11/14/2017 08:52:25 [name] ([id number]) -------------------------------------------------------------------------------- Progress Note Details Patient Name: [name] Date of Service: 11/14/2017 8:45 AM Medical Record Number: [id number] Patient Account Number: [id number] Date of Birth/Sex: 07/26/1919 (84 y.o. Female) Treating RN: Carolyne Fiscal, Debi Primary Care Provider: PATIENT, NO Other Clinician: Referring Provider: Referral, Self Treating Provider/Extender: STONE III, Talin Rozeboom Weeks in Treatment: 3 Subjective Chief Complaint Information obtained from Patient Right foot Ulcers History of Present Illness (HPI) 04/06/16; this is a 84 year old woman who arrives accompanied by 2 daughters for a wound on her left ankle and her left fifth toe. These have apparently been present for a year. I'm not quite certain how she came to this clinic however she was being followed by Sharlotte Alamo her podiatrist for these wounds. She was also referred to [facility] and they apparently did a test presumably arterial studies although we don't have any of these results and we couldn't get through to the office today. The family but has been applying a combination of Bactroban and a light bandage and perhaps more recently Silvadene cream. She did have an x-ray of the foot roughly 6 months ago at the podiatry office the family  was unaware that if there were any abnormalities. Apparently they have not seen any healing here. Our intake nurse noted a slight skin tear on the right anterior lower leg. Nobody seemed aware of this. ABIs calculated in this clinic was 0.3 on the right and 0.4 on the left I have reviewed things [facility]. There is very little information on this patient. She apparently follows in current total clinic which we don't have information from. She has mentioned already been to a [facility]. She has a history of hypothyroidism, nephrolithiasis arthritis and has had a previous mastectomy. 04/13/16; patient's x-ray was normal. She is already been to see Dr. Delana Meyer vascular surgery.  Her arterial exam was from November 2016 this showed a left ABI of 0.62 her right of 0.76. Her duplex ultrasound of the left leg showed biphasic waves in the common femoral and distal femoral artery however monophasic waves in the superficial femoral artery proximal and mid biphasic it distal. 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 The patient tells me that she has pain at night when she tries to lie down which is improved by getting up and sitting in the chair this sounds like claudication at rest. Her wounds are on the left medial malleolus and the dorsal fifth toe small punched out wounds that are right on bone. We use Santyl last week 04/20/16: nurse informed me pt has declined evaluation for significant PAD. she denies systemic s/s of infections. 04/27/16;; the patient has had noninvasive arterial studies done in November 2016. ABI and the left was 0.62. Monophasic waves at the superficial femoral artery. Occluded to the posterior tibial artery. So had greater than 50% stenosis of the right superficial femoral and greater than 50% stenosis of the left superficial femoral artery. She had bilateral tibial peroneal artery disease. Both of the wounds on the left fifth toe and left lateral malleolus have been present for more  than a year. They have been to see vein and vascular. The patient has some pain but miraculously I think the wounds have largely been stable. No evidence of infection 05/04/16; she goes for a noninvasive study tomorrow and then sees Dr. Fletcher Anon on Monday. By the time she is here next week we should have a better picture of whether something can be done with regards to her arterial flow. We continue to have ischemic-looking wounds on the left fifth toe and left lateral malleolus. 05/10/16; the patient went for her arterial studies and saw Dr. Fletcher Anon. As predicted she is felt to have critical limb ischemia. The feeling is that she has occlusion of the SFA. The feeling would she would be a candidate for a stent to the SFA. The patient did not make a decision to proceed with the procedure and she is here with family members to discuss this me today. He shouldn't has a lot of pain and cannot sleep and rest well at night per her family. 05/24/16; the patient went and had a complex revascularization/angioplasty of the left superficial femoral artery followed by drug-coated balloon angioplasty and spots stenting. She tolerated the procedure well. She was recommended for dual antiplatelet drugs with Plavix and aspirin for at least a month. She went yesterday for I believe follow-up serial Dopplers and ABIs although I don't see these results. The patient is unfortunately complaining of a lot of pain in her bilateral lower legs below the knees from the ankle to the knees. She apparently was prescribed lidocaine and apparently put this on her legs instead of over the wound areas. This may have something to do with it however there is a lot of edema in her bilateral legs I was able to find her arterial studies from yesterday. The left ABI has improved up to 0.66 post left SFA stent. The bilateral Mullings, Tamiyah ([id number]) great toe indices remain abnormal with the left being in the 0.25 range. Duplex ultrasound  showed her left SFA stent is patent monophasic waveforms persist in the left leg 06/07/16; continued punched-out areas over the dorsal left fifth toe and left lateral malleolus. No major improvement 06/28/16; the areas over her dorsal left fifth toe and left lateral malleolus are covered in surface slough  we are using Iodoflex 07/05/16. We have been using Iodoflex for 2-3 weeks now. I have not been debridement is because of pain 07/19/16 currently we have been using Iodoflex for roughly the past month. Previously we were unable to debride due to pain although today patient states that the pain is not nearly as severe as it has been in the past. In fact she rates this to be a 1 out of 10 and it worse to a to 10 with palpation of the wound. All and all her and her daughter feel like this is actually doing steadily better at this point in time. She is pleased with progress and we have been seeing her every 2 weeks. 08/02/16; this is a delightful 84 year old woman I have not seen in over a month. She has arterial insufficiency wounds remaining over the left lateral malleolus and the left fifth toe. With the help of Dr. Fletcher Anon we are able to get her revascularized on the left. The 2 wounds on the left have been making good progress and are definitely smaller especially the area over the left lateral malleolus. She is certainly in a lot less pain than she used to be although I still think there is some claudication type pain. Unfortunately she is developed a area on the right lateral foot which is a small open area but I think is threatened. I suspect a small ischemic areas well. Also on her right fifth toe there is an area that is not open but looks as though it is receiving too much pressure from footwear. Finally an area over her right malleolus although I don't think this is on its way to anything ominous 08/17/16 patient presents today for follow up evaluation she tells me that she is really doing fairly  well from a pain standpoint compared to where she has been previous. She is tolerating the dressing changes without any complication. She continues to have discharge and drainage however. 08-30-16 Ms. Malburg presents today with her daughter, she states that she continues to have intermittent shooting pains to the left lateral fifth toe at this site of seems to be a healed ulcer. She denies any other issues that her wound related since her last appointment. Her daughter states that she is in need of a tramadol refill at this time as she uses half a tablet at at bedtime to aid in sleep due to foot pain. Iodoflex has been used on all wounds in previous dressing changes. 09/13/16; the patient has ischemic wounds in her feet. The area over the left lateral malleolus is healed and the area over the left fifth toe looks improved.. She has an area on the lateral aspect of her right foot which is a small but deep wound. Finally she has a new open area on the medial aspect of the left medial malleolus. This is superficial 09/27/16; the area over the left lateral malleolus remains healed. The area over the left fifth toe has a surface and she states the pain is better but I don't think this is completely closed. She has an area on the lateral aspect of her right foot is a small but deep wound. The new open area on the medial aspect of the left ankle was closed from last time. 10/18/16; the area over her left lateral malleolus remains healed. The area over the left fifth toe has a surface over the top of this however there is no overt open area. Given the underlying issues of severe PAD  and continued pain in the toe I would think it would be unlikely this is truly healed however I am not planning to debridement this area. The area on the right lateral foot which was a more recent wound is a small punched out painful area again has significant surface slough and nonviable tissue. It is clear the patient still has  claudication type pain however she remains functional. I have been giving her tramadol when necessary and that seems to help a lot with her pain the patient follows up with Dr. Fletcher Anon on January 18/18 11/01/16; patient missed her follow-up with Dr. Fletcher Anon last week due to a snow day. Follow-up is now on February 15. The areas on the right lateral malleolus and dorsal right fifth toe remain closed over. The fifth toe was tentative is there is a surface eschar however I'm not going to disturb this. Therefore, her only open areas on the right lateral foot. This is a small punched out area. We have been using Prisma. I'm quite convinced this is an ischemic wound 11/15/16; patient has follow-up with Dr. Fletcher Anon on February 15. She has no open wound on the left leg and doesn't really complain of claudication that I can determine from talking to her. However on the right leg she clearly has some degree of claudication. The remaining open wound is on the right lateral foot. We have been using Iodosorb ointment 11/29/16; patient saw Dr. Fletcher Anon on 11/24/16. His comment is that her wound on her right lateral foot seems to be improving with local wound care. She has known significant right SFA disease. Her lower extremity Doppler will be repeated and according to the patient's daughter that appointment is on March 15. He is left with the thought that endovascular intervention in the right SFA might be necessary. The patient has quite a bit of pain especially at night related to the wound in her right foot. We changed her to Iodosorb ointment last week 12/13/16; this is a patient I follow every 2 weeks largely on a palliative approach at this point about ischemic wounds currently in the right foot. Initially she had them on her left lateral malleolus and left fifth toe. The area on the left lateral malleolus healed and the fifth toe has a surface eschar that I have elected not to remove. Both of these improved after  revascularization by Dr. Fletcher Anon. We have been using Iodosorb to the right lateral foot not much change here. 12/27/16; the patient had her arterial studies. This showed known bilateral SFA disease. Stable right ABI 0.5 to stable left ABI at 0.7 to the did not do it TBI on the right it was 0.61 on the left she has a stent in the long segment of her left SFA from Ganister, Estill Bamberg ([id number]) 05/18/16. All of her wounds are somewhat better. She states her pain is better in the right foot is improved. 01/10/17- patient is here for follow-up dilation of her right lateral foot ulcer. She has been tolerating Iodosorb. She is voices no complaints or concerns 01/24/17; small ischemic wound on right lateral foot. still non viable cover. follows with Dr. Fletcher Anon on 4/30. No open area on left foot 02/07/17 doing well and states she is pain free. Saw Dr. Fletcher Anon who said she is doing well and has "50% flow in the right leg and 70% in the left. According to her daughter he does not wish to do any further interventions 02/21/17; small ischemic wound on the right lateral foot.  Per her daughter and the patient she has no open wound on the left foot and ankle which were her initial presenting wounds. still has claudication type pain at night she is been to see interventional cardiology who does not feel that she has a need for intervention on the right leg at present. She still has claudication type pain in the right leg which she manages at night with when necessary tramadol that I have prescribed 03/14/17; small ischemic wound on the right lateral foot. They've been dressing this at home with Iodosorb ointment. The patient does not complain of any pain. The wound has a surface eschar over it and there is really no visible open area. This is similar to how the areas on the left leg healed 04/11/17; the small ischemic wound on her right lateral foot is finally closed over. Small amount of eschar over the surface however I did not  attempt to remove this. The patient claims to be asymptomatic she is not having any claudication that I'm able to elicit although her activity is limited Readmission: 10/23/17 on evaluation today patient appears to be doing somewhat poorly in regard to her right foot where she has two ulcers at this point. The worst is on the lateral aspect of her foot medial first metatarsal region is not nearly as bad. With that being said these did arise seemingly out of nowhere she has previously had a similar issue in the past these have always been ischemic wounds due to arterial insufficiency. She does see Dr. Fletcher Anon as her vascular specialist and it has been noted that her ABI's are somewhat low. She actually has a repeat evaluation coming up this March 2019 to reevaluate her blood flow. On the last check 12/22/16 it was noted that patient had stable ABI's in the lower moderate abnormal range in regard to the right in regard to the left and the upper moderate at normal range. Patient is having some discomfort though nothing severe at this point in time. She is seen with her daughter and son-in-law today. No fevers, chills, nausea, or vomiting noted at this time. 10/30/17 on evaluation today patient appears to be doing well in regard to her wounds. The right lateral foot wound appears to show signs of cleaning up nicely there is granulation noted underneath that the bed of the wound although there still some [facility] covering. Nonetheless she still has a lot of discomfort and I really do not want to proceed with debridement due to the fact that she does have so much discomfort. Good news is the tramadol has been helping her at bedtime she only takes this once a day and that has been extremely beneficial. Unfortunately she does not have a primary right now as she is awaiting a new one which is the reason that I am prescribing the tramadol. Nonetheless I'm pleased with how things seem to be progressing in regard to  her ulcers. 11/06/17 on evaluation today patient appears to be doing decently well as far as maintaining in regard to her ulcers. Unfortunately I did review her arterial study which shows that she has on the right arterial obstruction involving the superficial femoral and popliteal artery as well is the superficial femoral artery with a high grade stenosis versus inclusion. Collateral flow noted in the distal portion of the SFA. Severe progression is noted compared to previous study. Unfortunately being the patient's wounds do not seem to be healing as appropriately and quickly as we would like I do  believe this is likely a result of poor vascular flow to the right lower extremity. This was discussed with patient and her daughter during the office visit today. 11/14/17 on evaluation today patient continues to have a fairly stalled ulcer in regard to her to ulcers on the right lower extremity. She obviously did have significant findings on her arterial study and I do believe this is again due to poor vascular flow. She does have an appointment later this afternoon with her vascular specialist for further evaluation to see if there any recommendations at that point. With that being said she continues to have discomfort which I do believe is arterial in nature in regard to insufficiency. Patient History Information obtained from Patient. Family History Diabetes - Mother, Heart Disease - Siblings, Stroke - Siblings, No family history of Cancer, Hereditary Spherocytosis, Hypertension, Kidney Disease, Lung Disease, Seizures, Thyroid Problems, Tuberculosis. LONNIE, ROSADO ([id number]) Social History Never smoker, Marital Status - Widowed, Alcohol Use - Never, Drug Use - No History, Caffeine Use - Daily. Review of Systems (ROS) Constitutional Symptoms (General Health) Denies complaints or symptoms of Fever, Chills. Respiratory The patient has no complaints or symptoms. Cardiovascular The patient  has no complaints or symptoms. Psychiatric The patient has no complaints or symptoms. Objective Constitutional Thin and well-hydrated in no acute distress. Vitals Time Taken: 8:58 AM, Height: 64 in, Weight: 156 lbs, BMI: 26.8, Temperature: 98.2 F, Pulse: 83 bpm, Respiratory Rate: 16 breaths/min, Blood Pressure: 150/55 mmHg. Respiratory normal breathing without difficulty. clear to auscultation bilaterally. Cardiovascular regular rate and rhythm with normal S1, S2. 1+ dorsalis pedis/posterior tibialis pulses. Psychiatric this patient is able to make decisions and demonstrates good insight into disease process. Alert and Oriented x 3. pleasant and cooperative. General Notes: At this point patient's wound beds appear to still be slough covered unfortunately. There really is no significant change since prior evaluation again no sharp debridement have been performed due to the fact that patient has poor vascular flow as far as her arterial status of concern. Integumentary (Hair, Skin) Wound #6 status is Open. Original cause of wound was Gradually Appeared. The wound is located on the [employer]. The wound measures 0.4cm length x 0.2cm width x 0.1cm depth; 0.063cm^2 area and 0.006cm^3 volume. There is no tunneling or undermining noted. There is a large amount of serous drainage noted. The wound margin is distinct with the outline attached to the wound base. There is large (67-100%) red granulation within the wound bed. There is a small (1-33%) amount of necrotic tissue within the wound bed including Eschar. Periwound temperature was noted as No Abnormality. The periwound has tenderness on palpation. Wound #7 status is Open. Original cause of wound was Gradually Appeared. The wound is located on the Right,Dorsal Foot. The wound measures 1cm length x 1cm width x 0.2cm depth; 0.785cm^2 area and 0.157cm^3 volume. There is Fat Layer (Subcutaneous Tissue) Exposed exposed. There is no  tunneling or undermining noted. There is a large amount of serous drainage noted. The wound margin is distinct with the outline attached to the wound base. There is small (1-33%) pink granulation within the wound bed. There is a large (67-100%) amount of necrotic tissue within the wound bed including Melecio, Estill Bamberg ([id number]) [name]. The periwound skin appearance exhibited: Induration, Erythema. The surrounding wound skin color is noted with erythema which is circumferential. Periwound temperature was noted as No Abnormality. The periwound has tenderness on palpation. 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 Cleanse wound with mild soap and water May Shower, gently pat wound dry prior to applying new dressing. Wound #7 Right,Dorsal Foot: Clean wound with Normal Saline. Cleanse wound with mild soap and water May Shower, gently pat wound dry prior to applying new dressing. Anesthetic (add to Medication List): Wound #6 Right,Medial Toe Great: Topical Lidocaine 4% cream applied to wound bed prior to debridement (In Clinic Only). Wound #7 Right,Dorsal Foot: Topical Lidocaine 4% cream applied to wound bed prior to debridement (In Clinic Only). Skin Barriers/Peri-Wound Care: Wound #7 Right,Dorsal Foot: Skin Prep Primary Wound Dressing: Wound #6 Right,Medial Toe Great: Iodosorb Ointment Wound #7 Right,Dorsal Foot: Iodosorb Ointment Secondary Dressing: Wound #6 Right,Medial Toe Great: Other - coverlet Wound #7 Right,Dorsal Foot: Dry Gauze Tegaderm Dressing Change Frequency: Wound #6  Right,Medial Toe Great: Change dressing every day. Wound #7 Right,Dorsal Foot: Change dressing every day. Follow-up Appointments: TRANICE, LADUKE ([id number]) Wound #6 Right,Medial Toe Great: Return Appointment in 1 week. Wound #7 Right,Dorsal Foot: Return Appointment in 1 week. Edema Control: Wound #6 Right,Medial Toe Great: Elevate legs to the level of the heart and pump ankles as often as possible Wound #7 Right,Dorsal Foot: Elevate legs to the level of the heart and pump ankles as often as possible Off-Loading: Wound #6 Right,Medial Toe Great: Turn and reposition every 2 hours Wound #7 Right,Dorsal Foot: Turn and reposition every 2 hours Additional Orders / Instructions: Wound #6 Right,Medial Toe Great: Increase protein intake. Other: - Vitamin C, Zinc Wound #7 Right,Dorsal Foot: Increase protein intake. Other: - Vitamin C, Zinc The following medication(s) was prescribed: lidocaine topical 4 % cream 1 1 cream topical was prescribed at facility I'm gonna recommend that we continue with the Current wound care measures for the next week per above which is going to be a switch to the Iodosorb Ointment. Hopefully this will be of benefit for her in the past this did help with her left lower extremity ulcers. We will see what her vascular surgeon has to say when he sees her for reevaluation later today. Please see above for specific wound care orders. We will see patient for re-evaluation in 1 week(s) here in the clinic. If anything worsens or changes patient will contact our office for additional recommendations. Electronic Signature(s) Signed: 11/14/2017 1:41:25 PM By: Worthy Keeler PA-C Entered By: Worthy Keeler on 11/14/2017 12:00:49 [name] ([id number]) -------------------------------------------------------------------------------- ROS/PFSH Details Patient Name: [name] Date of Service: 11/14/2017 8:45 AM Medical Record Number: [id number] Patient Account  Number: [id number] Date of Birth/Sex: 1919/08/06 (84 y.o. Female) Treating RN: Carolyne Fiscal, Debi Primary Care Provider: PATIENT, NO Other Clinician: Referring Provider: Referral, Self Treating Provider/Extender: STONE III, Khayman Kirsch Weeks in Treatment: 3 Information Obtained From Patient Wound History Do you currently have one or more open woundso Yes How many open wounds do you currently haveo 1 Approximately how long have you had your woundso 1.5 weeks How have you been treating your wound(s) until nowo bandaide Has your wound(s) ever healed and then re-openedo No Have you had any lab work done in the past montho No Have you tested positive for an antibiotic resistant organism (MRSA, VRE)o No Have you tested positive for osteomyelitis (bone  infection)o No Have you had any tests for circulation on your legso Yes Where [facility] the test [facility] Have you had other problems associated with your woundso Swelling Constitutional Symptoms (General Health) Complaints and Symptoms: Negative for: Fever; Chills Eyes Medical History: Positive for: Cataracts - had surgery Respiratory Complaints and Symptoms: No Complaints or Symptoms Cardiovascular Complaints and Symptoms: No Complaints or Symptoms Medical History: Positive for: Hypertension Musculoskeletal Medical History: Positive for: Osteoarthritis Oncologic Medical History: Negative for: Received Chemotherapy; Received Radiation Psychiatric GENEE, RANN ([id number]) Complaints and Symptoms: No Complaints or Symptoms HBO Extended History Items Eyes: Cataracts Immunizations Pneumococcal Vaccine: Received Pneumococcal Vaccination: Yes Implantable Devices Family and Social History Cancer: No; Diabetes: Yes - Mother; Heart Disease: Yes - Siblings; Hereditary Spherocytosis: No; Hypertension: No; Kidney Disease: No; Lung Disease: No; Seizures: No; Stroke: Yes - Siblings; Thyroid Problems: No; Tuberculosis: No; Never smoker; Marital  Status - Widowed; Alcohol Use: Never; Drug Use: No History; Caffeine Use: Daily; Financial Concerns: No; Food, Clothing or Shelter Needs: No; Support System Lacking: No; Transportation Concerns: No; Advanced Directives: No; Patient does not want information on Advanced Directives; Do not resuscitate: No; Living Will: No; Medical Power of Attorney: No Physician Affirmation I have reviewed and agree with the above information. Electronic Signature(s) Signed: 11/14/2017 1:41:25 PM By: Worthy Keeler PA-C Signed: 11/14/2017 4:26:31 PM By: Alric Quan Entered By: Worthy Keeler on 11/14/2017 11:59:34 [name] ([id number]) -------------------------------------------------------------------------------- SuperBill Details Patient Name: [name] Date of Service: 11/14/2017 Medical Record Number: [id number] Patient Account Number: [id number] Date of Birth/Sex: 11/05/1918 (84 y.o. 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 chronic ulcer of other part of right foot with fat layer exposed I10 Essential (primary) hypertension Facility Procedures CPT4 Code: 76283151 Description: 99213 - WOUND CARE VISIT-LEV 3 EST PT Modifier: Quantity: 1 Physician Procedures CPT4 Code Description: [id number] [id number] - WC PHYS LEVEL 3 - EST PT ICD-10 Diagnosis Description I70.235 Atherosclerosis of native arteries of right leg with ulceration o I70.232 Atherosclerosis of native arteries of right leg with ulceration o L97.512  Non-pressure chronic ulcer of other part of right foot with fat l I10 Essential (primary)  hypertension Modifier: f other part of fo f calf ayer exposed Quantity: 1 ot Electronic Signature(s) Signed: 11/14/2017 4:37:16 PM By: Alric Quan Signed: 11/14/2017 7:27:36 PM By: Worthy Keeler PA-C Previous Signature: 11/14/2017 1:41:25 PM Version By: Worthy Keeler PA-C Entered By: Alric Quan on 11/14/2017 16:37:15

## 2017-11-20 NOTE — Progress Notes (Signed)
JANNEY, PRIEGO ([id number]) Visit Report for 11/14/2017 Arrival Information Details Patient Name: MONITA, SWIER Date of Service: 11/14/2017 8:45 AM Medical Record Number: [id number] Patient Account Number: [id number] Date of Birth/Sex: 18-Jun-1919 (84 y.o. Female) Treating RN: Cornell Barman Primary Care Montel Vanderhoof: PATIENT, NO Other Clinician: Referring Wauneta Silveria: Referral, Self Treating Kiley Solimine/Extender: Melburn Hake, HOYT Weeks in Treatment: 3 Visit Information History Since Last Visit Added or deleted any medications: No Patient Arrived: Walker Any new allergies or adverse reactions: No Arrival Time: 08:58 Had a fall or experienced change in No Accompanied By: daughter activities of daily living that may affect Transfer Assistance: None risk of falls: Secondary Verification Process Completed: Yes Signs or symptoms of abuse/neglect since last visito No Patient Requires Transmission-Based Precautions: No Hospitalized since last visit: No Patient Has Alerts: No Has Dressing in Place as Prescribed: Yes Pain Present Now: No Electronic Signature(s) Signed: 11/20/2017 11:05:32 AM By: Gretta Cool, BSN, RN, CWS, Kim RN, BSN Entered By: Gretta Cool, BSN, RN, CWS, Kim on 11/14/2017 08:58:33 [name] ([id number]) -------------------------------------------------------------------------------- Clinic Level of Care Assessment Details Patient Name: [name] Date of Service: 11/14/2017 8:45 AM Medical Record Number: [id number] Patient Account Number: [id number] Date of Birth/Sex: September 24, 1919 (84 y.o. Female) Treating RN: Carolyne Fiscal, Debi Primary Care Melaysia Streed: PATIENT, NO Other Clinician: Referring Addalynn Kumari: Referral, Self Treating Terriah Reggio/Extender: STONE III, HOYT Weeks in Treatment: 3 Clinic Level of Care Assessment Items TOOL 4 Quantity Score X - Use when only an EandM is performed on FOLLOW-UP visit 1 0 ASSESSMENTS - Nursing Assessment / Reassessment X - Reassessment of Co-morbidities  (includes updates in patient status) 1 10 X- 1 5 Reassessment of Adherence to Treatment Plan ASSESSMENTS - Wound and Skin Assessment / Reassessment []  - Simple Wound Assessment / Reassessment - one wound 0 X- 2 5 Complex Wound Assessment / Reassessment - multiple wounds []  - 0 Dermatologic / Skin Assessment (not related to wound area) ASSESSMENTS - Focused Assessment []  - Circumferential Edema Measurements - multi extremities 0 []  - 0 Nutritional Assessment / Counseling / Intervention []  - 0 Lower Extremity Assessment (monofilament, tuning fork, pulses) []  - 0 Peripheral Arterial Disease Assessment (using hand held doppler) ASSESSMENTS - Ostomy and/or Continence Assessment and Care []  - Incontinence Assessment and Management 0 []  - 0 Ostomy Care Assessment and Management (repouching, etc.) PROCESS - Coordination of Care X - Simple Patient / Family Education for ongoing care 1 15 []  - 0 Complex (extensive) Patient / Family Education for ongoing care []  - 0 Staff obtains Programmer, systems, Records, Test Results / Process Orders []  - 0 Staff telephones HHA, Nursing Homes / Clarify orders / etc []  - 0 Routine Transfer to another Facility (non-emergent condition) []  - 0 Routine Hospital Admission (non-emergent condition) []  - 0 New Admissions / Biomedical engineer / Ordering NPWT, Apligraf, etc. []  - 0 Emergency Hospital Admission (emergent condition) X- 1 10 Simple Discharge Coordination Hoel, Doneshia ([id number]) []  - 0 Complex (extensive) Discharge Coordination PROCESS - Special Needs []  - Pediatric / Minor Patient Management 0 []  - 0 Isolation Patient Management []  - 0 Hearing / Language / Visual special needs []  - 0 Assessment of Community assistance (transportation, D/C planning, etc.) []  - 0 Additional assistance / Altered mentation []  - 0 Support Surface(s) Assessment (bed, cushion, seat, etc.) INTERVENTIONS - Wound Cleansing / Measurement []  - Simple Wound  Cleansing - one wound 0 X- 2 5 Complex Wound Cleansing - multiple wounds X- 1 5 Wound Imaging (photographs - any number of wounds) []  - 0 Wound Tracing (instead  of photographs) []  - 0 Simple Wound Measurement - one wound X- 2 5 Complex Wound Measurement - multiple wounds INTERVENTIONS - Wound Dressings X - Small Wound Dressing one or multiple wounds 2 10 []  - 0 Medium Wound Dressing one or multiple wounds []  - 0 Large Wound Dressing one or multiple wounds X- 1 5 Application of Medications - topical []  - 0 Application of Medications - injection INTERVENTIONS - Miscellaneous []  - External ear exam 0 []  - 0 Specimen Collection (cultures, biopsies, blood, body fluids, etc.) []  - 0 Specimen(s) / Culture(s) sent or taken to Lab for analysis []  - 0 Patient Transfer (multiple staff / Civil Service fast streamer / Similar devices) []  - 0 Simple Staple / Suture removal (25 or less) []  - 0 Complex Staple / Suture removal (26 or more) []  - 0 Hypo / Hyperglycemic Management (close monitor of Blood Glucose) []  - 0 Ankle / Brachial Index (ABI) - do not check if billed separately X- 1 5 Vital Signs Show, Goldye ([id number]) Has the patient been seen at the hospital within the last three years: Yes Total Score: 105 Level Of Care: New/Established - Level 3 Electronic Signature(s) Signed: 11/16/2017 3:16:44 PM By: Alric Quan Entered By: Alric Quan on 11/14/2017 16:37:07 [name] ([id number]) -------------------------------------------------------------------------------- Encounter Discharge Information Details Patient Name: [name] Date of Service: 11/14/2017 8:45 AM Medical Record Number: [id number] Patient Account Number: [id number] Date of Birth/Sex: May 10, 1919 (84 y.o. Female) Treating RN: Cornell Barman Primary Care Brittley Regner: PATIENT, NO Other Clinician: Referring Tanashia Ciesla: Referral, Self Treating Isabeau Mccalla/Extender: STONE III, HOYT Weeks in Treatment: 3 Encounter  Discharge Information Items Discharge Pain Level: 0 Discharge Condition: Stable Ambulatory Status: Walker Discharge Destination: Home Transportation: Private Auto Accompanied By: self Schedule Follow-up Appointment: Yes Medication Reconciliation completed and No provided to Patient/Care Nadeen Shipman: Provided on Clinical Summary of Care: 11/14/2017 Form Type Recipient Paper Patient [facility] Electronic Signature(s) Signed: 11/15/2017 9:47:07 AM By: Ruthine Dose Entered By: Ruthine Dose on 11/14/2017 09:33:39 [name] ([id number]) -------------------------------------------------------------------------------- Lower Extremity Assessment Details Patient Name: [name] Date of Service: 11/14/2017 8:45 AM Medical Record Number: [id number] Patient Account Number: [id number] Date of Birth/Sex: 04-07-1919 (84 y.o. Female) Treating RN: Cornell Barman Primary Care Emberli Ballester: PATIENT, NO Other Clinician: Referring Marlen Koman: Referral, Self Treating Katlin Ciszewski/Extender: STONE III, HOYT Weeks in Treatment: 3 Vascular Assessment Claudication: Claudication Assessment [Right:Rest Pain] Pulses: Dorsalis Pedis Palpable: [Right:Yes] Posterior Tibial Extremity colors, hair growth, and conditions: Extremity Color: [Right:Normal] Hair Growth on Extremity: [Right:No] Temperature of Extremity: [Right:Warm] Capillary Refill: [Right:> 3 seconds] Toe Nail Assessment Left: Right: Thick: Yes Discolored: Yes Deformed: Yes Improper Length and Hygiene: No Electronic Signature(s) Signed: 11/20/2017 11:05:32 AM By: Gretta Cool, BSN, RN, CWS, Kim RN, BSN Entered By: Gretta Cool, BSN, RN, CWS, Kim on 11/14/2017 09:06:43 [name] ([id number]) -------------------------------------------------------------------------------- Multi Wound Chart Details Patient Name: [name] Date of Service: 11/14/2017 8:45 AM Medical Record Number: [id number] Patient Account Number: [id number] Date of Birth/Sex: 09/20/1919 (84  y.o. Female) Treating RN: Cornell Barman Primary Care Goodwin Kamphaus: PATIENT, NO Other Clinician: Referring Jered Heiny: Referral, Self Treating Demetrias Goodbar/Extender: STONE III, HOYT Weeks in Treatment: 3 Vital Signs Height(in): 64 Pulse(bpm): 36 Weight(lbs): 156 Blood Pressure(mmHg): 150/55 Body Mass Index(BMI): 27 Temperature(F): 98.2 Respiratory Rate 16 (breaths/min): Photos: [6:No Photos] [7:No Photos] [N/A:N/A] Wound Location: [6:Right Toe Great - Medial] [7:Right Foot - Dorsal] [N/A:N/A] Wounding Event: [6:Gradually Appeared] [7:Gradually Appeared] [N/A:N/A] 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] [7:Arterial Insufficiency Ulcer] [N/A:N/A] Comorbid History: [6:Cataracts, Hypertension, Osteoarthritis] [7:Cataracts, Hypertension, Osteoarthritis] [N/A:N/A] Date Acquired: [6:10/16/2017] [7:10/11/2017] [N/A:N/A] Weeks of  Treatment: [6:3] [7:3] [N/A:N/A] Wound Status: [6:Open] [7:Open] [N/A:N/A] Measurements L x W x D [6:0.4x0.2x0.1] [7:1x1x0.2] [N/A:N/A] (cm) Area (cm) : [6:0.063] [7:0.785] [N/A:N/A] Volume (cm) : [6:0.006] [7:0.157] [N/A:N/A] % Reduction in Area: [6:59.90%] [7:-38.90%] [N/A:N/A] % Reduction in Volume: [6:62.50%] [7:-38.90%] [N/A:N/A] Classification: [6:Partial Thickness] [7:Partial Thickness] [N/A:N/A] Exudate Amount: [6:Large] [7:Large] [N/A:N/A] Exudate Type: [6:Serous] [7:Serous] [N/A:N/A] Exudate Color: [6:amber] [7:amber] [N/A:N/A] Wound Margin: [6:Distinct, outline attached] [7:Distinct, outline attached] [N/A:N/A] Granulation Amount: [6:Large (67-100%)] [7:Small (1-33%)] [N/A:N/A] Granulation Quality: [6:Red] [7:Pink] [N/A:N/A] Necrotic Amount: [6:Small (1-33%)] [7:Large (67-100%)] [N/A:N/A] Necrotic Tissue: [6:Eschar] [7:Adherent Slough] [N/A:N/A] Exposed Structures: [6:Fascia: No Fat Layer (Subcutaneous Tissue) Exposed: No Tendon: No Muscle: No Joint: No Bone: No] [7:Fat Layer (Subcutaneous Tissue) Exposed: Yes Fascia: No Tendon: No Muscle: No Joint:  No Bone: No] [N/A:N/A] Epithelialization: [6:Medium (34-66%)] [7:None] [N/A:N/A] Periwound Skin Texture: [6:No Abnormalities Noted] [7:Induration: Yes] [N/A:N/A] Periwound Skin Moisture: [6:No Abnormalities Noted] [7:No Abnormalities Noted] [N/A:N/A] Periwound Skin Color: [6:No Abnormalities Noted] [7:Erythema: Yes] [N/A:N/A] Erythema Location: [6:N/A] [7:Circumferential] [N/A:N/A] Temperature: [6:No Abnormality] [7:No Abnormality] [N/A:N/A] Tenderness on Palpation: Yes Yes N/A Wound Preparation: Ulcer Cleansing: Ulcer Cleansing: N/A Rinsed/Irrigated with Saline Rinsed/Irrigated with Saline Topical Anesthetic Applied: Topical Anesthetic Applied: Other: lidocaine 4% Other: lidocaine 4% Treatment Notes Electronic Signature(s) Signed: 11/20/2017 11:05:32 AM By: Gretta Cool, BSN, RN, CWS, Kim RN, BSN Entered By: Gretta Cool, BSN, RN, CWS, Kim on 11/14/2017 09:07:03 [name] ([id number]) -------------------------------------------------------------------------------- Multi-Disciplinary Care Plan Details Patient Name: [name] Date of Service: 11/14/2017 8:45 AM Medical Record Number: [id number] Patient Account Number: [id number] Date of Birth/Sex: [birth year] (84 y.o. Female) Treating RN: Cornell Barman Primary Care Morgann Woodburn: PATIENT, NO Other Clinician: Referring Danna Casella: Referral, Self Treating Nery Kalisz/Extender: STONE III, HOYT Weeks in Treatment: 3 Active Inactive ` Abuse / Safety / Falls / Self Care Management Nursing Diagnoses: History of Falls Potential for falls Goals: Patient will not experience any injury related to falls Date Initiated: 10/23/2017 Target Resolution Date: 02/17/2018 Goal Status: Active Interventions: Assess Activities of Daily Living upon admission and as needed Assess fall risk on admission and as needed Assess: immobility, friction, shearing, incontinence upon admission and as needed Assess impairment of mobility on admission and as needed per  policy Assess personal safety and home safety (as indicated) on admission and as needed Assess self care needs on admission and as needed Notes: ` Nutrition Nursing Diagnoses: Imbalanced nutrition Potential for alteratiion in Nutrition/Potential for imbalanced nutrition Goals: Patient/caregiver agrees to and verbalizes understanding of need to use nutritional supplements and/or vitamins as prescribed Date Initiated: 10/23/2017 Target Resolution Date: 02/17/2018 Goal Status: Active Interventions: Assess patient nutrition upon admission and as needed per policy Notes: ` Orientation to the Wound Care Program Nursing Diagnoses: MORGHAN, KESTER ([id number]) Knowledge deficit related to the wound healing center program Goals: Patient/caregiver will verbalize understanding of the [facility] Program Date Initiated: 10/23/2017 Target Resolution Date: 11/18/2017 Goal Status: Active Interventions: Provide education on orientation to the wound center Notes: ` Pain, Acute or Chronic Nursing Diagnoses: Pain, acute or chronic: actual or potential Potential alteration in comfort, pain Goals: Patient/caregiver will verbalize adequate pain control between visits Date Initiated: 10/23/2017 Target Resolution Date: 02/17/2018 Goal Status: Active Interventions: Complete pain assessment as per visit requirements Notes: ` Wound/Skin Impairment Nursing Diagnoses: Impaired tissue integrity Knowledge deficit related to ulceration/compromised skin integrity Goals: Ulcer/skin breakdown will have a volume reduction of 80% by week 12 Date Initiated: 10/23/2017 Target Resolution Date: 02/17/2018 Goal Status: Active Interventions: Assess patient/caregiver ability to perform ulcer/skin care regimen upon admission and  as needed Assess ulceration(s) every visit Notes: Electronic Signature(s) Signed: 11/20/2017 11:05:32 AM By: Gretta Cool, BSN, RN, CWS, Kim RN, BSN Entered By: Gretta Cool, BSN, RN, CWS,  Kim on 11/14/2017 09:06:48 [name] ([id number]) -------------------------------------------------------------------------------- Pain Assessment Details Patient Name: [name] Date of Service: 11/14/2017 8:45 AM Medical Record Number: [id number] Patient Account Number: [id number] Date of Birth/Sex: 05-03-1919 (84 y.o. Female) Treating RN: Cornell Barman Primary Care Annalissa Murphey: PATIENT, NO Other Clinician: Referring Geniyah Eischeid: Referral, Self Treating Kirin Pastorino/Extender: STONE III, HOYT Weeks in Treatment: 3 Active Problems Location of Pain Severity and Description of Pain Patient Has Paino No Site Locations With Dressing Change: No Pain Management and Medication Current Pain Management: Goals for Pain Management Topical or injectable lidocaine is offered to patient for acute pain when surgical debridement is performed. If needed, Patient is instructed to use over the counter pain medication for the following 24-48 hours after debridement. Wound care MDs do not prescribed pain medications. Patient has chronic pain or uncontrolled pain. Patient has been instructed to make an appointment with their Primary Care Physician for pain management. Electronic Signature(s) Signed: 11/20/2017 11:05:32 AM By: Gretta Cool, BSN, RN, CWS, Kim RN, BSN Entered By: Gretta Cool, BSN, RN, CWS, Kim on 11/14/2017 08:58:54 [name] ([id number]) -------------------------------------------------------------------------------- Patient/Caregiver Education Details Patient Name: [name] Date of Service: 11/14/2017 8:45 AM Medical Record Number: [id number] Patient Account Number: [id number] Date of Birth/Gender: 1919/02/10 (84 y.o. Female) Treating RN: Cornell Barman Primary Care Physician: PATIENT, NO Other Clinician: Referring Physician: Referral, Self Treating Physician/Extender: Melburn Hake, HOYT Weeks in Treatment: 3 Education Assessment Education Provided To: Patient and Caregiver daughter Education  Topics Provided Wound/Skin Impairment: Handouts: Caring for Your Ulcer, Other: change dressing as ordered Methods: Demonstration, Explain/Verbal Responses: State content correctly Electronic Signature(s) Signed: 11/20/2017 11:05:32 AM By: Gretta Cool, BSN, RN, CWS, Kim RN, BSN Entered By: Gretta Cool, BSN, RN, CWS, Kim on 11/14/2017 09:30:42 [name] ([id number]) -------------------------------------------------------------------------------- Wound Assessment Details Patient Name: [name] Date of Service: 11/14/2017 8:45 AM Medical Record Number: [id number] Patient Account Number: [id number] Date of Birth/Sex: [birth year] (84 y.o. Female) Treating RN: Cornell Barman Primary Care Elisabet Gutzmer: PATIENT, NO Other Clinician: Referring Antoneo Ghrist: Referral, Self Treating Nikos Anglemyer/Extender: STONE III, HOYT Weeks in Treatment: 3 Wound Status Wound Number: 6 Primary Etiology: Arterial Insufficiency Ulcer Wound Location: Right Toe Great - Medial Wound Status: Open Wounding Event: Gradually Appeared Comorbid History: Cataracts, Hypertension, Osteoarthritis Date Acquired: 10/16/2017 Weeks Of Treatment: 3 Clustered Wound: No Wound Measurements Length: (cm) 0.4 Width: (cm) 0.2 Depth: (cm) 0.1 Area: (cm) 0.063 Volume: (cm) 0.006 % Reduction in Area: 59.9% % Reduction in Volume: 62.5% Epithelialization: Medium (34-66%) Tunneling: No Undermining: No Wound Description Classification: Partial Thickness Wound Margin: Distinct, outline attached Exudate Amount: Large Exudate Type: Serous Exudate Color: amber Foul Odor After Cleansing: No Slough/Fibrino No Wound Bed Granulation Amount: Large (67-100%) Exposed Structure Granulation Quality: Red Fascia Exposed: No Necrotic Amount: Small (1-33%) Fat Layer (Subcutaneous Tissue) Exposed: No Necrotic Quality: Eschar Tendon Exposed: No Muscle Exposed: No Joint Exposed: No Bone Exposed: No Periwound Skin Texture Texture Color No Abnormalities  Noted: No No Abnormalities Noted: No Moisture Temperature / Pain No Abnormalities Noted: No Temperature: No Abnormality Tenderness on Palpation: Yes Wound Preparation Ulcer Cleansing: Rinsed/Irrigated with Saline Topical Anesthetic Applied: Other: lidocaine 4%, Treatment Notes Wound #6 (Right, [address] [place], [place] ([id number]) 1. Cleansed with: Clean wound with Normal Saline 2. Anesthetic Topical Lidocaine 4% cream to wound bed prior to debridement 3. Peri-wound Care: Skin Prep 4. Dressing Applied: Iodosorb Ointment Notes coverlet  Electronic Signature(s) Signed: 11/20/2017 11:05:32 AM By: Gretta Cool, BSN, RN, CWS, Kim RN, BSN Entered By: Gretta Cool, BSN, RN, CWS, Kim on 11/14/2017 09:04:41 [name] ([id number]) -------------------------------------------------------------------------------- Wound Assessment Details Patient Name: [name] Date of Service: 11/14/2017 8:45 AM Medical Record Number: [id number] Patient Account Number: [id number] Date of Birth/Sex: Mar 06, 1919 (84 y.o. Female) Treating RN: Cornell Barman Primary Care Deyjah Kindel: PATIENT, NO Other Clinician: Referring Samra Pesch: Referral, Self Treating Senetra Dillin/Extender: STONE III, HOYT Weeks in Treatment: 3 Wound Status Wound Number: 7 Primary Etiology: Arterial Insufficiency Ulcer Wound Location: Right Foot - Dorsal Wound Status: Open Wounding Event: Gradually Appeared Comorbid History: Cataracts, Hypertension, Osteoarthritis Date Acquired: 10/11/2017 Weeks Of Treatment: 3 Clustered Wound: No Wound Measurements Length: (cm) 1 Width: (cm) 1 Depth: (cm) 0.2 Area: (cm) 0.785 Volume: (cm) 0.157 % Reduction in Area: -38.9% % Reduction in Volume: -38.9% Epithelialization: None Tunneling: No Undermining: No Wound Description Classification: Partial Thickness Wound Margin: Distinct, outline attached Exudate Amount: Large Exudate Type: Serous Exudate Color: amber Foul Odor After Cleansing:  No Slough/Fibrino Yes Wound Bed Granulation Amount: Small (1-33%) Exposed Structure Granulation Quality: Pink Fascia Exposed: No Necrotic Amount: Large (67-100%) Fat Layer (Subcutaneous Tissue) Exposed: Yes Necrotic Quality: Adherent Slough Tendon Exposed: No Muscle Exposed: No Joint Exposed: No Bone Exposed: No Periwound Skin Texture Texture Color No Abnormalities Noted: No No Abnormalities Noted: No Induration: Yes Erythema: Yes Erythema Location: Circumferential Moisture No Abnormalities Noted: No Temperature / Pain Temperature: No Abnormality Tenderness on Palpation: Yes Wound Preparation Ulcer Cleansing: Rinsed/Irrigated with Saline Topical Anesthetic Applied: Other: lidocaine 4%, Treatment Notes Debruyne, Shamaria ([id number]) Wound #7 (Right, Dorsal Foot) 1. Cleansed with: Clean wound with Normal Saline 2. Anesthetic Topical Lidocaine 4% cream to wound bed prior to debridement 3. Peri-wound Care: Skin Prep 4. Dressing Applied: Iodosorb Ointment 5. Secondary Dressing Applied Dry Gauze Tegaderm Notes coverlet Electronic Signature(s) Signed: 11/20/2017 11:05:32 AM By: Gretta Cool, BSN, RN, CWS, Kim RN, BSN Entered By: Gretta Cool, BSN, RN, CWS, Kim on 11/14/2017 09:05:11 [name] ([id number]) -------------------------------------------------------------------------------- [place] Details Patient Name: [name] Date of Service: 11/14/2017 8:45 AM Medical Record Number: [id number] Patient Account Number: [id number] Date of Birth/Sex: 1918/12/10 (84 y.o. Female) Treating RN: Cornell Barman Primary Care Yaritsa Savarino: PATIENT, NO Other Clinician: Referring Kimisha Eunice: Referral, Self Treating Jeanny Rymer/Extender: STONE III, HOYT Weeks in Treatment: 3 Vital Signs Time Taken: 08:58 Temperature (F): 98.2 Height (in): 64 Pulse (bpm): 83 Weight (lbs): 156 Respiratory Rate (breaths/min): 16 Body Mass Index (BMI): 26.8 Blood Pressure (mmHg): 150/55 Reference Range: 80 - 120  mg / dl Electronic Signature(s) Signed: 11/20/2017 11:05:32 AM By: Gretta Cool, BSN, RN, CWS, Kim RN, BSN Entered By: Gretta Cool, BSN, RN, CWS, Kim on 11/14/2017 08:59:51

## 2017-11-21 ENCOUNTER — Other Ambulatory Visit
Admission: RE | Admit: 2017-11-21 | Discharge: 2017-11-21 | Disposition: A | Discharge status: Home / Self Care | Payer: Medicare PPO | Source: Ambulatory Visit | Attending: Physician Assistant | Admitting: Physician Assistant

## 2017-11-21 ENCOUNTER — Encounter: Payer: Medicare PPO | Admitting: Physician Assistant

## 2017-11-21 ENCOUNTER — Telehealth: Payer: Self-pay | Admitting: Cardiovascular Disease

## 2017-11-21 DIAGNOSIS — B999 Unspecified infectious disease: Secondary | ICD-10-CM | POA: Diagnosis present

## 2017-11-21 DIAGNOSIS — I70235 Atherosclerosis of native arteries of right leg with ulceration of other part of foot: Secondary | ICD-10-CM | POA: Diagnosis not present

## 2017-11-21 NOTE — Telephone Encounter (Signed)
Pt's daughter, [name], reports pt taking Keflex 500mg  BID x 10 days for possible foot infection. Made Dr. Fletcher Anon aware as she is scheduled for procedure tomorrow at [facility]. S/w [name] who has no questions regarding pre-procedure instructions.

## 2017-11-21 NOTE — Telephone Encounter (Signed)
Patient daughter [name] calling Patient was seen by the wound care doctor today He is not sure if her foot is infected or just irritated but he has prescribed her an antibiotic Since she is having a stent placed tomorrow she needed to call and inform the doctor of the medication changes Please call to discuss

## 2017-11-22 ENCOUNTER — Ambulatory Visit (HOSPITAL_COMMUNITY)
Admission: RE | Disposition: A | Discharge status: Home / Self Care | Payer: Self-pay | Source: Ambulatory Visit | Attending: Cardiovascular Disease

## 2017-11-22 ENCOUNTER — Other Ambulatory Visit: Payer: Self-pay

## 2017-11-22 ENCOUNTER — Ambulatory Visit (HOSPITAL_COMMUNITY)
Admission: RE | Admit: 2017-11-22 | Discharge: 2017-11-23 | Disposition: A | Discharge status: Home / Self Care | Payer: Medicare PPO | Source: Ambulatory Visit | Attending: Cardiovascular Disease | Admitting: Cardiovascular Disease

## 2017-11-22 ENCOUNTER — Encounter (HOSPITAL_COMMUNITY): Payer: Self-pay | Admitting: General Practice

## 2017-11-22 DIAGNOSIS — Z86718 Personal history of other venous thrombosis and embolism: Secondary | ICD-10-CM | POA: Diagnosis not present

## 2017-11-22 DIAGNOSIS — I10 Essential (primary) hypertension: Secondary | ICD-10-CM | POA: Insufficient documentation

## 2017-11-22 DIAGNOSIS — I70235 Atherosclerosis of native arteries of right leg with ulceration of other part of foot: Secondary | ICD-10-CM | POA: Insufficient documentation

## 2017-11-22 DIAGNOSIS — L97519 Non-pressure chronic ulcer of other part of right foot with unspecified severity: Secondary | ICD-10-CM | POA: Insufficient documentation

## 2017-11-22 DIAGNOSIS — I4891 Unspecified atrial fibrillation: Secondary | ICD-10-CM | POA: Diagnosis not present

## 2017-11-22 DIAGNOSIS — Z885 Allergy status to narcotic agent status: Secondary | ICD-10-CM | POA: Diagnosis not present

## 2017-11-22 DIAGNOSIS — Z7902 Long term (current) use of antithrombotics/antiplatelets: Secondary | ICD-10-CM | POA: Insufficient documentation

## 2017-11-22 DIAGNOSIS — I70229 Atherosclerosis of native arteries of extremities with rest pain, unspecified extremity: Secondary | ICD-10-CM | POA: Diagnosis present

## 2017-11-22 DIAGNOSIS — E785 Hyperlipidemia, unspecified: Secondary | ICD-10-CM | POA: Diagnosis not present

## 2017-11-22 DIAGNOSIS — E039 Hypothyroidism, unspecified: Secondary | ICD-10-CM | POA: Insufficient documentation

## 2017-11-22 DIAGNOSIS — I998 Other disorder of circulatory system: Secondary | ICD-10-CM | POA: Diagnosis present

## 2017-11-22 DIAGNOSIS — M1990 Unspecified osteoarthritis, unspecified site: Secondary | ICD-10-CM | POA: Insufficient documentation

## 2017-11-22 DIAGNOSIS — I70238 Atherosclerosis of native arteries of right leg with ulceration of other part of lower right leg: Secondary | ICD-10-CM | POA: Diagnosis not present

## 2017-11-22 DIAGNOSIS — I739 Peripheral vascular disease, unspecified: Secondary | ICD-10-CM

## 2017-11-22 HISTORY — DX: Pneumonia, unspecified organism: J18.9

## 2017-11-22 HISTORY — PX: PERIPHERAL VASCULAR ATHERECTOMY: CATH118256

## 2017-11-22 HISTORY — DX: Pure hypercholesterolemia, unspecified: E78.00

## 2017-11-22 HISTORY — PX: ABDOMINAL AORTOGRAM W/LOWER EXTREMITY: CATH118223

## 2017-11-22 LAB — POCT ACTIVATED CLOTTING TIME
ACTIVATED CLOTTING TIME: 235 s
Activated Clotting Time: 235 seconds

## 2017-11-22 SURGERY — ABDOMINAL AORTOGRAM W/LOWER EXTREMITY
Anesthesia: LOCAL | Laterality: Right

## 2017-11-22 MED ORDER — ASPIRIN EC 81 MG PO TBEC
81.0000 mg | DELAYED_RELEASE_TABLET | Freq: Every day | ORAL | Status: DC
  Administered 2017-11-23: 10:00:00 81 mg via ORAL
  Filled 2017-11-22: qty 1

## 2017-11-22 MED ORDER — NITROGLYCERIN 1 MG/10 ML FOR IR/CATH LAB
INTRA_ARTERIAL | Status: DC | PRN
  Administered 2017-11-22 (×3): 200 ug via INTRA_ARTERIAL

## 2017-11-22 MED ORDER — LABETALOL HCL 5 MG/ML IV SOLN
INTRAVENOUS | Status: AC
  Filled 2017-11-22: qty 4

## 2017-11-22 MED ORDER — ASPIRIN 81 MG PO CHEW
81.0000 mg | CHEWABLE_TABLET | ORAL | Status: AC
  Administered 2017-11-22: 81 mg via ORAL

## 2017-11-22 MED ORDER — AMLODIPINE BESYLATE 5 MG PO TABS
5.0000 mg | ORAL_TABLET | Freq: Every day | ORAL | Status: DC
  Administered 2017-11-22: 21:00:00 5 mg via ORAL
  Filled 2017-11-22: qty 1

## 2017-11-22 MED ORDER — ANGIOPLASTY BOOK
Freq: Once | Status: AC
  Administered 2017-11-22: 1
  Filled 2017-11-22: qty 1

## 2017-11-22 MED ORDER — CLOPIDOGREL BISULFATE 75 MG PO TABS
75.0000 mg | ORAL_TABLET | ORAL | Status: AC
  Administered 2017-11-22: 75 mg via ORAL

## 2017-11-22 MED ORDER — ATORVASTATIN CALCIUM 20 MG PO TABS
20.0000 mg | ORAL_TABLET | Freq: Every day | ORAL | Status: DC
  Administered 2017-11-22: 19:00:00 20 mg via ORAL
  Filled 2017-11-22: qty 1

## 2017-11-22 MED ORDER — FENTANYL CITRATE (PF) 100 MCG/2ML IJ SOLN
INTRAMUSCULAR | Status: DC | PRN
  Administered 2017-11-22 (×2): 25 ug via INTRAVENOUS

## 2017-11-22 MED ORDER — LEVOTHYROXINE SODIUM 50 MCG PO TABS
50.0000 ug | ORAL_TABLET | Freq: Every day | ORAL | Status: DC
  Administered 2017-11-23: 50 ug via ORAL
  Filled 2017-11-22: qty 1

## 2017-11-22 MED ORDER — SODIUM CHLORIDE 0.9 % IV SOLN
INTRAVENOUS | Status: DC
  Administered 2017-11-22: 09:00:00 via INTRAVENOUS

## 2017-11-22 MED ORDER — SODIUM CHLORIDE 0.9% FLUSH: 3.0000 mL | Freq: Two times a day (BID) | INTRAVENOUS | Status: DC

## 2017-11-22 MED ORDER — POLYVINYL ALCOHOL 1.4 % OP SOLN
Freq: Every day | OPHTHALMIC | Status: DC | PRN
  Administered 2017-11-22: 15:00:00 via OPHTHALMIC
  Filled 2017-11-22: qty 15

## 2017-11-22 MED ORDER — ACETAMINOPHEN 500 MG PO TABS: 1000.0000 mg | ORAL_TABLET | Freq: Four times a day (QID) | ORAL | Status: DC | PRN

## 2017-11-22 MED ORDER — SODIUM CHLORIDE 0.9 % IV SOLN: 250.0000 mL | INTRAVENOUS | Status: DC | PRN

## 2017-11-22 MED ORDER — NITROGLYCERIN IN D5W 200-5 MCG/ML-% IV SOLN
INTRAVENOUS | Status: AC
  Filled 2017-11-22: qty 250

## 2017-11-22 MED ORDER — CEPHALEXIN 500 MG PO CAPS
500.0000 mg | ORAL_CAPSULE | Freq: Two times a day (BID) | ORAL | Status: DC
  Administered 2017-11-22 – 2017-11-23 (×3): 500 mg via ORAL
  Filled 2017-11-22 (×3): qty 1

## 2017-11-22 MED ORDER — VITAMIN B-12 100 MCG PO TABS
100.0000 ug | ORAL_TABLET | Freq: Every day | ORAL | Status: DC
  Administered 2017-11-23: 10:00:00 100 ug via ORAL
  Filled 2017-11-22 (×2): qty 1

## 2017-11-22 MED ORDER — ASPIRIN 81 MG PO CHEW
CHEWABLE_TABLET | ORAL | Status: AC
  Administered 2017-11-22: 81 mg via ORAL
  Filled 2017-11-22: qty 1

## 2017-11-22 MED ORDER — SODIUM CHLORIDE 0.9 % WEIGHT BASED INFUSION: 1.0000 mL/kg/h | INTRAVENOUS | Status: AC

## 2017-11-22 MED ORDER — EYE VITAMINS PO CAPS: ORAL_CAPSULE | Freq: Every day | ORAL | Status: DC

## 2017-11-22 MED ORDER — LABETALOL HCL 5 MG/ML IV SOLN
INTRAVENOUS | Status: DC | PRN
  Administered 2017-11-22: 10 mg via INTRAVENOUS

## 2017-11-22 MED ORDER — HEPARIN (PORCINE) IN NACL 2-0.9 UNIT/ML-% IJ SOLN
INTRAMUSCULAR | Status: AC | PRN
  Administered 2017-11-22: 1000 mL

## 2017-11-22 MED ORDER — CLOPIDOGREL BISULFATE 75 MG PO TABS
75.0000 mg | ORAL_TABLET | Freq: Every day | ORAL | Status: DC
  Administered 2017-11-23: 10:00:00 75 mg via ORAL
  Filled 2017-11-22: qty 1

## 2017-11-22 MED ORDER — CLOPIDOGREL BISULFATE 75 MG PO TABS
ORAL_TABLET | ORAL | Status: AC
  Filled 2017-11-22: qty 1

## 2017-11-22 MED ORDER — VERAPAMIL HCL 2.5 MG/ML IV SOLN
INTRAVENOUS | Status: AC
  Filled 2017-11-22: qty 2

## 2017-11-22 MED ORDER — FENTANYL CITRATE (PF) 100 MCG/2ML IJ SOLN
INTRAMUSCULAR | Status: AC
  Filled 2017-11-22: qty 2

## 2017-11-22 MED ORDER — IODIXANOL 320 MG/ML IV SOLN
INTRAVENOUS | Status: DC | PRN
  Administered 2017-11-22: 155 mL via INTRA_ARTERIAL

## 2017-11-22 MED ORDER — CLOPIDOGREL BISULFATE 300 MG PO TABS
ORAL_TABLET | ORAL | Status: AC
  Filled 2017-11-22: qty 1

## 2017-11-22 MED ORDER — TRAMADOL HCL 50 MG PO TABS
50.0000 mg | ORAL_TABLET | Freq: Every day | ORAL | Status: DC
  Administered 2017-11-22: 50 mg via ORAL
  Filled 2017-11-22: qty 1

## 2017-11-22 MED ORDER — CLOPIDOGREL BISULFATE 75 MG PO TABS
ORAL_TABLET | ORAL | Status: AC
  Administered 2017-11-22: 75 mg via ORAL
  Filled 2017-11-22: qty 1

## 2017-11-22 MED ORDER — HEPARIN SODIUM (PORCINE) 1000 UNIT/ML IJ SOLN
INTRAMUSCULAR | Status: AC
  Filled 2017-11-22: qty 1

## 2017-11-22 MED ORDER — SODIUM CHLORIDE 0.9% FLUSH: 3.0000 mL | INTRAVENOUS | Status: DC | PRN

## 2017-11-22 MED ORDER — HEPARIN SODIUM (PORCINE) 1000 UNIT/ML IJ SOLN
INTRAMUSCULAR | Status: DC | PRN
  Administered 2017-11-22: 2000 [IU] via INTRAVENOUS
  Administered 2017-11-22: 7000 [IU] via INTRAVENOUS

## 2017-11-22 MED ORDER — LOSARTAN POTASSIUM 50 MG PO TABS
50.0000 mg | ORAL_TABLET | Freq: Every day | ORAL | Status: DC
  Administered 2017-11-22: 50 mg via ORAL
  Filled 2017-11-22: qty 1

## 2017-11-22 MED ORDER — LABETALOL HCL 5 MG/ML IV SOLN: 10.0000 mg | INTRAVENOUS | Status: DC | PRN

## 2017-11-22 MED ORDER — LIDOCAINE HCL 1 % IJ SOLN
INTRAMUSCULAR | Status: AC
  Filled 2017-11-22: qty 20

## 2017-11-22 MED ORDER — LORATADINE 10 MG PO TABS
10.0000 mg | ORAL_TABLET | Freq: Every day | ORAL | Status: DC
  Filled 2017-11-22 (×2): qty 1

## 2017-11-22 MED ORDER — CLOPIDOGREL BISULFATE 300 MG PO TABS
ORAL_TABLET | ORAL | Status: DC | PRN
  Administered 2017-11-22: 300 mg via ORAL

## 2017-11-22 MED ORDER — LIDOCAINE HCL (PF) 1 % IJ SOLN
INTRAMUSCULAR | Status: DC | PRN
  Administered 2017-11-22: 20 mL

## 2017-11-22 MED ORDER — HEPARIN (PORCINE) IN NACL 2-0.9 UNIT/ML-% IJ SOLN
INTRAMUSCULAR | Status: AC
  Filled 2017-11-22: qty 1000

## 2017-11-22 SURGICAL SUPPLY — 32 items
BALLN COYOTE OTW 3.5X60X150 (BALLOONS) ×3
BALLN LUTONIX 5X150X130 (BALLOONS) ×3
BALLN LUTONIX DCB 4X80X130 (BALLOONS) ×3
BALLOON COYOTE OTW 3.5X60X150 (BALLOONS) ×2 IMPLANT
BALLOON LUTONIX 5X150X130 (BALLOONS) ×2 IMPLANT
BALLOON LUTONIX DCB 4X80X130 (BALLOONS) ×2 IMPLANT
CATH NAVICROSS ANGLED 135CM (MICROCATHETER) ×3 IMPLANT
CATH OMNI FLUSH 5F 65CM (CATHETERS) ×3 IMPLANT
COVER PRB 48X5XTLSCP FOLD TPE (BAG) ×2 IMPLANT
COVER PROBE 5X48 (BAG) ×3
DEVICE CLOSURE MYNXGRIP 6/7F (Vascular Products) ×3 IMPLANT
DEVICE TORQUE .025-.038 (MISCELLANEOUS) ×3 IMPLANT
DIAMONDBACK CLASSIC OAS 1.5MM (CATHETERS) ×3
GUIDEWIRE ANGLED .035X260CM (WIRE) ×3 IMPLANT
GUIDEWIRE STR TIP .014X300X8 (WIRE) ×3 IMPLANT
KIT ENCORE 26 ADVANTAGE (KITS) ×3 IMPLANT
KIT PV (KITS) ×3 IMPLANT
LUBRICANT VIPERSLIDE CORONARY (MISCELLANEOUS) ×3 IMPLANT
SHEATH PINNACLE 5F 10CM (SHEATH) ×3 IMPLANT
SHEATH PINNACLE 6F 10CM (SHEATH) ×3 IMPLANT
SHEATH PINNACLE MP 6F 45CM (SHEATH) ×3 IMPLANT
SHIELD RADPAD SCOOP 12X17 (MISCELLANEOUS) ×3 IMPLANT
STOPCOCK MORSE 400PSI 3WAY (MISCELLANEOUS) ×3 IMPLANT
SYRINGE MEDRAD AVANTA MACH 7 (SYRINGE) ×3 IMPLANT
SYSTEM DIMNDBCK CLSC OAS 1.5MM (CATHETERS) ×2 IMPLANT
TAPE VIPERTRACK RADIOPAQ (MISCELLANEOUS) ×2 IMPLANT
TAPE VIPERTRACK RADIOPAQUE (MISCELLANEOUS) ×2
TRANSDUCER W/STOPCOCK (MISCELLANEOUS) ×3 IMPLANT
TRAY PV CATH (CUSTOM PROCEDURE TRAY) ×3 IMPLANT
TUBING CIL FLEX 10 FLL-RA (TUBING) ×3 IMPLANT
WIRE BENTSON .035X145CM (WIRE) ×3 IMPLANT
WIRE VIPER ADVANCE .017X335CM (WIRE) ×3 IMPLANT

## 2017-11-22 NOTE — Progress Notes (Signed)
LAURY, HUIZAR ([id number]) Visit Report for 11/21/2017 Arrival Information Details Patient Name: [name], [name] Date of Service: 11/21/2017 12:30 PM Medical Record Number: [id number] Patient Account Number: [id number] Date of Birth/Sex: 07-29-1919 (84 y.o. Female) Treating RN: Carolyne Fiscal, Debi Primary Care Jaisean Monteforte: PATIENT, NO Other Clinician: Referring Naiah Donahoe: Referral, Self Treating Braysen Cloward/Extender: STONE III, HOYT Weeks in Treatment: 4 Visit Information History Since Last Visit All ordered tests and consults were completed: No Patient Arrived: Gilford Rile Added or deleted any medications: No Arrival Time: 12:43 Any new allergies or adverse reactions: No Accompanied By: daughter Had [name] fall or experienced change in No Transfer Assistance: EasyPivot Patient activities of daily living that may affect Lift risk of falls: Patient Identification Verified: Yes Signs or symptoms of abuse/neglect since last visito No Secondary Verification Process Yes Hospitalized since last visit: No Completed: Has Dressing in Place as Prescribed: Yes Patient Requires Transmission-Based No Precautions: Pain Present Now: No Patient Has Alerts: No Electronic Signature(s) Signed: 11/21/2017 4:46:02 PM By: Alric Quan Entered By: Alric Quan on 11/21/2017 12:43:47 [name] ([id number]) -------------------------------------------------------------------------------- Clinic Level of Care Assessment Details Patient Name: [name] Date of Service: 11/21/2017 12:30 PM Medical Record Number: [id number] Patient Account Number: [id number] Date of Birth/Sex: [birth year] (84 y.o. Female) Treating RN: Carolyne Fiscal, Debi Primary Care Imari Sivertsen: PATIENT, NO Other Clinician: Referring Anayansi Rundquist: Referral, Self Treating Aura Bibby/Extender: STONE III, HOYT Weeks in Treatment: 4 Clinic Level of Care Assessment Items TOOL 4 Quantity Score X - Use when only an EandM is performed on FOLLOW-UP visit  1 0 ASSESSMENTS - Nursing Assessment / Reassessment X - Reassessment of Co-morbidities (includes updates in patient status) 1 10 X- 1 5 Reassessment of Adherence to Treatment Plan ASSESSMENTS - Wound and Skin Assessment / Reassessment []  - Simple Wound Assessment / Reassessment - one wound 0 X- 2 5 Complex Wound Assessment / Reassessment - multiple wounds []  - 0 Dermatologic / Skin Assessment (not related to wound area) ASSESSMENTS - Focused Assessment []  - Circumferential Edema Measurements - multi extremities 0 []  - 0 Nutritional Assessment / Counseling / Intervention []  - 0 Lower Extremity Assessment (monofilament, tuning fork, pulses) []  - 0 Peripheral Arterial Disease Assessment (using hand held doppler) ASSESSMENTS - Ostomy and/or Continence Assessment and Care []  - Incontinence Assessment and Management 0 []  - 0 Ostomy Care Assessment and Management (repouching, etc.) PROCESS - Coordination of Care X - Simple Patient / Family Education for ongoing care 1 15 []  - 0 Complex (extensive) Patient / Family Education for ongoing care []  - 0 Staff obtains Programmer, systems, Records, Test Results / Process Orders []  - 0 Staff telephones HHA, Nursing Homes / Clarify orders / etc []  - 0 Routine Transfer to another Facility (non-emergent condition) []  - 0 Routine Hospital Admission (non-emergent condition) []  - 0 New Admissions / Biomedical engineer / Ordering NPWT, Apligraf, etc. []  - 0 Emergency Hospital Admission (emergent condition) X- 1 10 Simple Discharge Coordination [name], [name] ([id number]) []  - 0 Complex (extensive) Discharge Coordination PROCESS - Special Needs []  - Pediatric / Minor Patient Management 0 []  - 0 Isolation Patient Management []  - 0 Hearing / Language / Visual special needs []  - 0 Assessment of Community assistance (transportation, D/C planning, etc.) []  - 0 Additional assistance / Altered mentation []  - 0 Support Surface(s) Assessment (bed,  cushion, seat, etc.) INTERVENTIONS - Wound Cleansing / Measurement []  - Simple Wound Cleansing - one wound 0 X- 2 5 Complex Wound Cleansing - multiple wounds X- 1 5 Wound Imaging (photographs - any number of wounds) []  -  0 Wound Tracing (instead of photographs) []  - 0 Simple Wound Measurement - one wound X- 2 5 Complex Wound Measurement - multiple wounds INTERVENTIONS - Wound Dressings X - Small Wound Dressing one or multiple wounds 2 10 []  - 0 Medium Wound Dressing one or multiple wounds []  - 0 Large Wound Dressing one or multiple wounds X- 1 5 Application of Medications - topical []  - 0 Application of Medications - injection INTERVENTIONS - Miscellaneous []  - External ear exam 0 []  - 0 Specimen Collection (cultures, biopsies, blood, body fluids, etc.) []  - 0 Specimen(s) / Culture(s) sent or taken to Lab for analysis []  - 0 Patient Transfer (multiple staff / Harrel Lemon Lift / Similar devices) []  - 0 Simple Staple / Suture removal (25 or less) []  - 0 Complex Staple / Suture removal (26 or more) []  - 0 Hypo / Hyperglycemic Management (close monitor of Blood Glucose) []  - 0 Ankle / Brachial Index (ABI) - do not check if billed separately X- 1 5 Vital Signs [name], [name] ([id number]) Has the patient been seen at the hospital within the last three years: Yes Total Score: 105 Level Of Care: New/Established - Level 3 Electronic Signature(s) Signed: 11/21/2017 4:46:02 PM By: Alric Quan Entered By: Alric Quan on 11/21/2017 16:24:40 [name] ([id number]) -------------------------------------------------------------------------------- Encounter Discharge Information Details Patient Name: [name] Date of Service: 11/21/2017 12:30 PM Medical Record Number: [id number] Patient Account Number: [id number] Date of Birth/Sex: June 21, 1919 (84 y.o. Female) Treating RN: Carolyne Fiscal, Debi Primary Care Tripp Goins: PATIENT, NO Other Clinician: Referring Semaya Vida:  Referral, Self Treating Leiland Mihelich/Extender: STONE III, HOYT Weeks in Treatment: 4 Encounter Discharge Information Items Discharge Pain Level: 0 Discharge Condition: Stable Ambulatory Status: Walker Discharge Destination: Home Transportation: Private Auto Accompanied By: daughter Schedule Follow-up Appointment: Yes Medication Reconciliation completed and No provided to Patient/Care Alishia Lebo: Provided on Clinical Summary of Care: 11/21/2017 Form Type Recipient Paper Patient [facility] Electronic Signature(s) Signed: 11/21/2017 4:23:12 PM By: Alric Quan Entered By: Alric Quan on 11/21/2017 16:23:11 [name] ([id number]) -------------------------------------------------------------------------------- Lower Extremity Assessment Details Patient Name: [name] Date of Service: 11/21/2017 12:30 PM Medical Record Number: [id number] Patient Account Number: [id number] Date of Birth/Sex: 04-May-1919 (84 y.o. Female) Treating RN: Carolyne Fiscal, Debi Primary Care Abir Craine: PATIENT, NO Other Clinician: Referring Strother Everitt: Referral, Self Treating Lyllie Cobbins/Extender: STONE III, HOYT Weeks in Treatment: 4 Vascular Assessment Pulses: Dorsalis Pedis Palpable: [Right:Yes] Posterior Tibial Extremity colors, hair growth, and conditions: Extremity Color: [Right:Normal] Temperature of Extremity: [Right:Warm] Capillary Refill: [Right:> 3 seconds] Electronic Signature(s) Signed: 11/21/2017 4:46:02 PM By: Alric Quan Entered By: Alric Quan on 11/21/2017 12:51:53 [name] ([id number]) -------------------------------------------------------------------------------- Multi Wound Chart Details Patient Name: [name] Date of Service: 11/21/2017 12:30 PM Medical Record Number: [id number] Patient Account Number: [id number] Date of Birth/Sex: [birth year] (84 y.o. Female) Treating RN: Carolyne Fiscal, Debi Primary Care Charda Janis: PATIENT, NO Other Clinician: Referring Haillee Johann:  Referral, Self Treating Deedee Lybarger/Extender: STONE III, HOYT Weeks in Treatment: 4 Vital Signs Height(in): 64 Pulse(bpm): 81 Weight(lbs): 156 Blood Pressure(mmHg): 134/87 Body Mass Index(BMI): 27 Temperature(F): 98.4 Respiratory Rate 16 (breaths/min): Photos: [6:No Photos] [7:No Photos] [N/[name]:N/[name]] Wound Location: [6:Right Toe Great - Medial] [7:Right Foot - Dorsal] [N/[name]:N/[name]] Wounding Event: [6:Gradually Appeared] [7:Gradually Appeared] [N/[name]:N/[name]] 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] [7:Arterial Insufficiency Ulcer] [N/[name]:N/[name]] Comorbid History: [6:Cataracts, Hypertension, Osteoarthritis] [7:Cataracts, Hypertension, Osteoarthritis] [N/[name]:N/[name]] Date Acquired: [6:10/16/2017] [7:10/11/2017] [N/[name]:N/[name]] Weeks of Treatment: [6:4] [7:4] [N/[name]:N/[name]] Wound Status: [6:Open] [7:Open] [N/[name]:N/[name]] Measurements L x W x D [6:0.5x0.2x0.1] [7:1.2x1.5x0.3] [N/[name]:N/[name]] (cm) Area (cm) : [6:0.079] [7:1.414] [N/[name]:N/[name]] Volume (cm) : [9:3.734] [7:0.424] [  N/[name]:N/[name]] % Reduction in Area: [6:49.70%] [7:-150.30%] [N/[name]:N/[name]] % Reduction in Volume: [6:50.00%] [7:-275.20%] [N/[name]:N/[name]] Classification: [6:Partial Thickness] [7:Partial Thickness] [N/[name]:N/[name]] Exudate Amount: [6:Large] [7:Large] [N/[name]:N/[name]] Exudate Type: [6:Serous] [7:Purulent] [N/[name]:N/[name]] Exudate Color: [6:amber] [7:yellow, brown, green] [N/[name]:N/[name]] Wound Margin: [6:Distinct, outline attached] [7:Distinct, outline attached] [N/[name]:N/[name]] Granulation Amount: [6:Large (67-100%)] [7:Small (1-33%)] [N/[name]:N/[name]] Granulation Quality: [6:Red] [7:Pink] [N/[name]:N/[name]] Necrotic Amount: [6:Small (1-33%)] [7:Large (67-100%)] [N/[name]:N/[name]] Necrotic Tissue: [6:Eschar] [7:Adherent Slough] [N/[name]:N/[name]] Exposed Structures: [6:Fascia: No Fat Layer (Subcutaneous Tissue) Exposed: No Tendon: No Muscle: No Joint: No Bone: No] [7:Fat Layer (Subcutaneous Tissue) Exposed: Yes Fascia: No Tendon: No Muscle: No Joint: No Bone: No] [N/[name]:N/[name]] Epithelialization: [6:Medium (34-66%)] [7:None]  [N/[name]:N/[name]] Periwound Skin Texture: [6:No Abnormalities Noted] [7:Induration: Yes] [N/[name]:N/[name]] Periwound Skin Moisture: [6:No Abnormalities Noted] [7:Maceration: Yes] [N/[name]:N/[name]] Periwound Skin Color: [6:No Abnormalities Noted] [7:Erythema: Yes] [N/[name]:N/[name]] Erythema Location: [6:N/[name]] [7:Circumferential] [N/[name]:N/[name]] Temperature: [6:No Abnormality] [7:No Abnormality] [N/[name]:N/[name]] Tenderness on Palpation: Yes Yes N/[name] Wound Preparation: Ulcer Cleansing: Ulcer Cleansing: N/[name] Rinsed/Irrigated with Saline Rinsed/Irrigated with Saline Topical Anesthetic Applied: Topical Anesthetic Applied: Other: lidocaine 4% Other: lidocaine 4% Treatment Notes Electronic Signature(s) Signed: 11/21/2017 4:46:02 PM By: Alric Quan Entered By: Alric Quan on 11/21/2017 12:57:02 [name] ([id number]) -------------------------------------------------------------------------------- [place] Details Patient Name: [name] Date of Service: 11/21/2017 12:30 PM Medical Record Number: [id number] Patient Account Number: [id number] Date of Birth/Sex: [birth year] (84 y.o. Female) Treating RN: Carolyne Fiscal, Debi Primary Care Aquinnah Devin: PATIENT, NO Other Clinician: Referring Kelise Kuch: Referral, Self Treating Cassandre Oleksy/Extender: STONE III, HOYT Weeks in Treatment: 4 Active Inactive ` Abuse / Safety / Falls / Self Care Management Nursing Diagnoses: History of Falls Potential for falls Goals: Patient will not experience any injury related to falls Date Initiated: 10/23/2017 Target Resolution Date: 02/17/2018 Goal Status: Active Interventions: Assess Activities of Daily Living upon admission and as needed Assess fall risk on admission and as needed Assess: immobility, friction, shearing, incontinence upon admission and as needed Assess impairment of mobility on admission and as needed per policy Assess personal safety and home safety (as indicated) on admission and as needed Assess self  care needs on admission and as needed Notes: ` Nutrition Nursing Diagnoses: Imbalanced nutrition Potential for alteratiion in Nutrition/Potential for imbalanced nutrition Goals: Patient/caregiver agrees to and verbalizes understanding of need to use nutritional supplements and/or vitamins as prescribed Date Initiated: 10/23/2017 Target Resolution Date: 02/17/2018 Goal Status: Active Interventions: Assess patient nutrition upon admission and as needed per policy Notes: ` Orientation to the Wound Care Program Nursing Diagnoses: [name], [name] ([id number]) Knowledge deficit related to the wound healing center program Goals: Patient/caregiver will verbalize understanding of the [place] Program Date Initiated: 10/23/2017 Target Resolution Date: 11/18/2017 Goal Status: Active Interventions: Provide education on orientation to the wound center Notes: ` Pain, Acute or Chronic Nursing Diagnoses: Pain, acute or chronic: actual or potential Potential alteration in comfort, pain Goals: Patient/caregiver will verbalize adequate pain control between visits Date Initiated: 10/23/2017 Target Resolution Date: 02/17/2018 Goal Status: Active Interventions: Complete pain assessment as per visit requirements Notes: ` Wound/Skin Impairment Nursing Diagnoses: Impaired tissue integrity Knowledge deficit related to ulceration/compromised skin integrity Goals: Ulcer/skin breakdown will have [name] volume reduction of 80% by week 12 Date Initiated: 10/23/2017 Target Resolution Date: 02/17/2018 Goal Status: Active Interventions: Assess patient/caregiver ability to perform ulcer/skin care regimen upon admission and as needed Assess ulceration(s) every visit Notes: Electronic Signature(s) Signed: 11/21/2017 4:46:02 PM By: Alric Quan Entered By: Alric Quan on 11/21/2017 12:56:53 [name]  ([id number]) -------------------------------------------------------------------------------- Pain Assessment Details Patient Name: [name] Date of  Service: 11/21/2017 12:30 PM Medical Record Number: [id number] Patient Account Number: [id number] Date of Birth/Sex: [birth year] (84 y.o. Female) Treating RN: Carolyne Fiscal, Debi Primary Care Aileene Lanum: PATIENT, NO Other Clinician: Referring Casy Tavano: Referral, Self Treating Ebin Palazzi/Extender: STONE III, HOYT Weeks in Treatment: 4 Active Problems Location of Pain Severity and Description of Pain Patient Has Paino No Site Locations Pain Management and Medication Current Pain Management: Electronic Signature(s) Signed: 11/21/2017 4:46:02 PM By: Alric Quan Entered By: Alric Quan on 11/21/2017 12:43:53 [name] ([id number]) -------------------------------------------------------------------------------- Patient/Caregiver Education Details Patient Name: [name] Date of Service: 11/21/2017 12:30 PM Medical Record Number: [id number] Patient Account Number: [id number] Date of Birth/Gender: 1918/12/11 (84 y.o. Female) Treating RN: Carolyne Fiscal, Debi Primary Care Physician: PATIENT, NO Other Clinician: Referring Physician: Referral, Self Treating Physician/Extender: Melburn Hake, HOYT Weeks in Treatment: 4 Education Assessment Education Provided To: Patient Education Topics Provided Wound/Skin Impairment: Handouts: Caring for Your Ulcer, Other: change dressing as ordered Methods: Demonstration, Explain/Verbal Responses: State content correctly Electronic Signature(s) Signed: 11/21/2017 4:46:02 PM By: Alric Quan Entered By: Alric Quan on 11/21/2017 16:23:32 [name] ([id number]) -------------------------------------------------------------------------------- Wound Assessment Details Patient Name: [name] Date of Service: 11/21/2017 12:30 PM Medical Record Number: [id number] Patient Account  Number: [id number] Date of Birth/Sex: Oct 27, 1918 (84 y.o. Female) Treating RN: Carolyne Fiscal, Debi Primary Care Endre Coutts: PATIENT, NO Other Clinician: Referring Treyveon Mochizuki: Referral, Self Treating Anyiah Coverdale/Extender: STONE III, HOYT Weeks in Treatment: 4 Wound Status Wound Number: 6 Primary Etiology: Arterial Insufficiency Ulcer Wound Location: Right Toe Great - Medial Wound Status: Open Wounding Event: Gradually Appeared Comorbid History: Cataracts, Hypertension, Osteoarthritis Date Acquired: 10/16/2017 Weeks Of Treatment: 4 Clustered Wound: No Photos Photo Uploaded By: Alric Quan on 11/21/2017 16:33:20 Wound Measurements Length: (cm) 0.5 Width: (cm) 0.2 Depth: (cm) 0.1 Area: (cm) 0.079 Volume: (cm) 0.008 % Reduction in Area: 49.7% % Reduction in Volume: 50% Epithelialization: Medium (34-66%) Tunneling: No Undermining: No Wound Description Classification: Partial Thickness Wound Margin: Distinct, outline attached Exudate Amount: Large Exudate Type: Serous Exudate Color: amber Foul Odor After Cleansing: No Slough/Fibrino No Wound Bed Granulation Amount: Large (67-100%) Exposed Structure Granulation Quality: Red Fascia Exposed: No Necrotic Amount: Small (1-33%) Fat Layer (Subcutaneous Tissue) Exposed: No Necrotic Quality: Eschar Tendon Exposed: No Muscle Exposed: No Joint Exposed: No Bone Exposed: No Periwound Skin Texture [name], [name] ([id number]) Texture Color No Abnormalities Noted: No No Abnormalities Noted: No Moisture Temperature / Pain No Abnormalities Noted: No Temperature: No Abnormality Tenderness on Palpation: Yes Wound Preparation Ulcer Cleansing: Rinsed/Irrigated with Saline Topical Anesthetic Applied: Other: lidocaine 4%, Treatment Notes Wound #6 (Right, Medial Toe Great) 1. Cleansed with: Clean wound with Normal Saline 2. Anesthetic Topical Lidocaine 4% cream to wound bed prior to debridement 4. Dressing Applied: Santyl Ointment 5.  Secondary Dressing Applied Dry Gauze Notes coverlet Electronic Signature(s) Signed: 11/21/2017 4:46:02 PM By: Alric Quan Entered By: Alric Quan on 11/21/2017 12:49:41 [name] ([id number]) -------------------------------------------------------------------------------- Wound Assessment Details Patient Name: [name] Date of Service: 11/21/2017 12:30 PM Medical Record Number: [id number] Patient Account Number: [id number] Date of Birth/Sex: 04/15/1919 (84 y.o. Female) Treating RN: Carolyne Fiscal, Debi Primary Care Milano Rosevear: PATIENT, NO Other Clinician: Referring Chanler Mendonca: Referral, Self Treating Grete Bosko/Extender: STONE III, HOYT Weeks in Treatment: 4 Wound Status Wound Number: 7 Primary Etiology: Arterial Insufficiency Ulcer Wound Location: Right Foot - Dorsal Wound Status: Open Wounding Event: Gradually Appeared Comorbid History: Cataracts, Hypertension, Osteoarthritis Date Acquired: 10/11/2017 Weeks Of Treatment: 4 Clustered Wound: No Photos Photo Uploaded By: Alric Quan on 11/21/2017 16:33:20 Wound Measurements Length: (cm) 1.2 Width: (cm) 1.5 Depth: (  cm) 0.3 Area: (cm) 1.414 Volume: (cm) 0.424 % Reduction in Area: -150.3% % Reduction in Volume: -275.2% Epithelialization: None Tunneling: No Undermining: No Wound Description Classification: Partial Thickness Wound Margin: Distinct, outline attached Exudate Amount: Large Exudate Type: Purulent Exudate Color: yellow, brown, green Foul Odor After Cleansing: No Slough/Fibrino Yes Wound Bed Granulation Amount: Small (1-33%) Exposed Structure Granulation Quality: Pink Fascia Exposed: No Necrotic Amount: Large (67-100%) Fat Layer (Subcutaneous Tissue) Exposed: Yes Necrotic Quality: Adherent Slough Tendon Exposed: No Muscle Exposed: No Joint Exposed: No Bone Exposed: No Periwound Skin Texture Newsom, Shirlette ([id number]) Texture Color No Abnormalities Noted: No No Abnormalities  Noted: No Induration: Yes Erythema: Yes Erythema Location: Circumferential Moisture No Abnormalities Noted: No Temperature / Pain Maceration: Yes Temperature: No Abnormality Tenderness on Palpation: Yes Wound Preparation Ulcer Cleansing: Rinsed/Irrigated with Saline Topical Anesthetic Applied: Other: lidocaine 4%, Treatment Notes Wound #7 (Right, Dorsal Foot) 1. Cleansed with: Clean wound with Normal Saline 2. Anesthetic Topical Lidocaine 4% cream to wound bed prior to debridement 3. Peri-wound Care: Skin Prep 4. Dressing Applied: Santyl Ointment 5. Secondary Dressing Applied Dry Gauze Tegaderm Electronic Signature(s) Signed: 11/21/2017 4:46:02 PM By: Alric Quan Entered By: Alric Quan on 11/21/2017 12:52:05 [name] ([id number]) -------------------------------------------------------------------------------- [place] Details Patient Name: [name] Date of Service: 11/21/2017 12:30 PM Medical Record Number: [id number] Patient Account Number: [id number] Date of Birth/Sex: [birth year] (84 y.o. Female) Treating RN: Carolyne Fiscal, Debi Primary Care Kellsie Grindle: PATIENT, NO Other Clinician: Referring Jahmari Esbenshade: Referral, Self Treating Tiaria Biby/Extender: STONE III, HOYT Weeks in Treatment: 4 Vital Signs Time Taken: 12:43 Temperature (F): 98.4 Height (in): 64 Pulse (bpm): 81 Weight (lbs): 156 Respiratory Rate (breaths/min): 16 Body Mass Index (BMI): 26.8 Blood Pressure (mmHg): 134/87 Reference Range: 80 - 120 mg / dl Electronic Signature(s) Signed: 11/21/2017 4:46:02 PM By: Alric Quan Entered By: Alric Quan on 11/21/2017 12:46:21

## 2017-11-22 NOTE — Progress Notes (Signed)
Purewick catheter put in place in cath lab holding.

## 2017-11-22 NOTE — Progress Notes (Signed)
[name] ([id number]) Visit Report for 11/21/2017 Chief Complaint Document Details Patient Name: [name], [name] Date of Service: 11/21/2017 12:30 PM Medical Record Number: [id number] Patient Account Number: [id number] Date of Birth/Sex: 06-19-1919 (84 y.o. Female) Treating RN: Carolyne Fiscal, Debi Primary Care Provider: PATIENT, NO Other Clinician: Referring Provider: Referral, Self Treating Provider/Extender: STONE III, HOYT Weeks in Treatment: 4 Information Obtained from: Patient Chief Complaint Right foot Ulcers Electronic Signature(s) Signed: 11/21/2017 5:22:42 PM By: Worthy Keeler PA-C Entered By: Worthy Keeler on 11/21/2017 12:55:54 [name] ([id number]) -------------------------------------------------------------------------------- HPI Details Patient Name: [name] Date of Service: 11/21/2017 12:30 PM Medical Record Number: [id number] Patient Account Number: [id number] Date of Birth/Sex: 05-Jul-1919 (84 y.o. Female) Treating RN: Carolyne Fiscal, Debi Primary Care Provider: PATIENT, NO Other Clinician: Referring Provider: Referral, Self Treating Provider/Extender: STONE III, HOYT Weeks in Treatment: 4 History of Present Illness HPI Description: 04/06/16; this is a 84 year old woman who arrives accompanied by 2 daughters for a wound on her left ankle and her left fifth toe. These have apparently been present for a year. I'm not quite certain how she came to this clinic however she was being followed by Sharlotte Alamo her podiatrist for these wounds. She was also referred to [facility] and they apparently did a test presumably arterial studies although we don't have any of these results and we couldn't get through to the office today. The family but has been applying a combination of Bactroban and a light bandage and perhaps more recently Silvadene cream. She did have an x-ray of the foot roughly 6 months ago at the podiatry office the family was unaware  that if there were any abnormalities. Apparently they have not seen any healing here. Our intake nurse noted a slight skin tear on the right anterior lower leg. Nobody seemed aware of this. ABIs calculated in this clinic was 0.3 on the right and 0.4 on the left I have reviewed things in [facility]. There is very little information on this patient. She apparently follows in current [facility] clinic which we don't have information from. She has mentioned already been to a [facility]. She has a history of hypothyroidism, nephrolithiasis arthritis and has had a previous mastectomy. 04/13/16; patient's x-ray was normal. She is already been to see Dr. Delana Meyer vascular surgery. Her arterial exam was from November 2016 this showed a left ABI of 0.62 her right of 0.76. Her duplex ultrasound of the left leg showed biphasic waves in the common femoral and distal femoral artery however monophasic waves in the superficial femoral artery proximal and mid biphasic it distal. 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 The patient tells me that she has pain at night when she tries to lie down which is improved by getting up and sitting in the chair this sounds like claudication at rest. Her wounds are on the left medial malleolus and the dorsal fifth toe small punched out wounds that are right on bone. We use Santyl last week 04/20/16: nurse informed me pt has declined evaluation for significant PAD. she denies systemic s/s of infections. 04/27/16;; the patient has had noninvasive arterial studies done in November 2016. ABI and the left was 0.62. Monophasic waves at the superficial femoral artery. Occluded to the posterior tibial artery. So had greater than 50% stenosis of the right superficial femoral and greater than 50% stenosis of the left superficial femoral artery. She had bilateral tibial peroneal artery disease. Both of the wounds on the left fifth toe and left lateral malleolus have  been present for more than a year.  They have been to see vein and vascular. The patient has some pain but miraculously I think the wounds have largely been stable. No evidence of infection 05/04/16; she goes for a noninvasive study tomorrow and then sees Dr. Fletcher Anon on Monday. By the time she is here next week we should have a better picture of whether something can be done with regards to her arterial flow. We continue to have ischemic-looking wounds on the left fifth toe and left lateral malleolus. 05/10/16; the patient went for her arterial studies and saw Dr. Fletcher Anon. As predicted she is felt to have critical limb ischemia. The feeling is that she has occlusion of the SFA. The feeling would she would be a candidate for a stent to the SFA. The patient did not make a decision to proceed with the procedure and she is here with family members to discuss this me today. He shouldn't has a lot of pain and cannot sleep and rest well at night per her family. 05/24/16; the patient went and had a complex revascularization/angioplasty of the left superficial femoral artery followed by drug-coated balloon angioplasty and spots stenting. She tolerated the procedure well. She was recommended for dual antiplatelet drugs with Plavix and aspirin for at least a month. She went yesterday for I believe follow-up serial Dopplers and ABIs although I don't see these results. The patient is unfortunately complaining of a lot of pain in her bilateral lower legs below the knees from the ankle to the knees. She apparently was prescribed lidocaine and apparently put this on her legs instead of over the wound areas. This may have something to do with it however there is a lot of edema in her bilateral legs I was able to find her arterial studies from yesterday. The left ABI has improved up to 0.66 post left SFA stent. The bilateral great toe indices remain abnormal with the left being in the 0.25 range. Duplex ultrasound showed her left SFA stent is  patent monophasic waveforms persist in the left leg 06/07/16; continued punched-out areas over the dorsal left fifth toe and left lateral malleolus. No major improvement 06/28/16; the areas over her dorsal left fifth toe and left lateral malleolus are covered in surface slough we are using Iodoflex 07/05/16. We have been using Iodoflex for 2-3 weeks now. I have not been debridement is because of pain SHERBY, MONCAYO ([id number]) 07/19/16 currently we have been using Iodoflex for roughly the past month. Previously we were unable to debride due to pain although today patient states that the pain is not nearly as severe as it has been in the past. In fact she rates this to be a 1 out of 10 and it worse to a to 10 with palpation of the wound. All and all her and her daughter feel like this is actually doing steadily better at this point in time. She is pleased with progress and we have been seeing her every 2 weeks. 08/02/16; this is a delightful 84 year old woman I have not seen in over a month. She has arterial insufficiency wounds remaining over the left lateral malleolus and the left fifth toe. With the help of Dr. Fletcher Anon we are able to get her revascularized on the left. The 2 wounds on the left have been making good progress and are definitely smaller especially the area over the left lateral malleolus. She is certainly in a lot less pain than she used to be although I still  think there is some claudication type pain. Unfortunately she is developed a area on the right lateral foot which is a small open area but I think is threatened. I suspect a small ischemic areas well. Also on her right fifth toe there is an area that is not open but looks as though it is receiving too much pressure from footwear. Finally an area over her right malleolus although I don't think this is on its way to anything ominous 08/17/16 patient presents today for follow up evaluation she tells me that she is really doing  fairly well from a pain standpoint compared to where she has been previous. She is tolerating the dressing changes without any complication. She continues to have discharge and drainage however. 08-30-16 Ms. Quito presents today with her daughter, she states that she continues to have intermittent shooting pains to the left lateral fifth toe at this site of seems to be a healed ulcer. She denies any other issues that her wound related since her last appointment. Her daughter states that she is in need of a tramadol refill at this time as she uses half a tablet at at bedtime to aid in sleep due to foot pain. Iodoflex has been used on all wounds in previous dressing changes. 09/13/16; the patient has ischemic wounds in her feet. The area over the left lateral malleolus is healed and the area over the left fifth toe looks improved.. She has an area on the lateral aspect of her right foot which is a small but deep wound. Finally she has a new open area on the medial aspect of the left medial malleolus. This is superficial 09/27/16; the area over the left lateral malleolus remains healed. The area over the left fifth toe has a surface and she states the pain is better but I don't think this is completely closed. She has an area on the lateral aspect of her right foot is a small but deep wound. The new open area on the medial aspect of the left ankle was closed from last time. 10/18/16; the area over her left lateral malleolus remains healed. The area over the left fifth toe has a surface over the top of this however there is no overt open area. Given the underlying issues of severe PAD and continued pain in the toe I would think it would be unlikely this is truly healed however I am not planning to debridement this area. The area on the right lateral foot which was a more recent wound is a small punched out painful area again has significant surface slough and nonviable tissue. It is clear the patient  still has claudication type pain however she remains functional. I have been giving her tramadol when necessary and that seems to help a lot with her pain the patient follows up with Dr. Fletcher Anon on January 18/18 11/01/16; patient missed her follow-up with Dr. Fletcher Anon last week due to a snow day. Follow-up is now on February 15. The areas on the right lateral malleolus and dorsal right fifth toe remain closed over. The fifth toe was tentative is there is a surface eschar however I'm not going to disturb this. Therefore, her only open areas on the right lateral foot. This is a small punched out area. We have been using Prisma. I'm quite convinced this is an ischemic wound 11/15/16; patient has follow-up with Dr. Fletcher Anon on February 15. She has no open wound on the left leg and doesn't really complain of claudication that  I can determine from talking to her. However on the right leg she clearly has some degree of claudication. The remaining open wound is on the right lateral foot. We have been using Iodosorb ointment 11/29/16; patient saw Dr. Fletcher Anon on 11/24/16. His comment is that her wound on her right lateral foot seems to be improving with local wound care. She has known significant right SFA disease. Her lower extremity Doppler will be repeated and according to the patient's daughter that appointment is on March 15. He is left with the thought that endovascular intervention in the right SFA might be necessary. The patient has quite a bit of pain especially at night related to the wound in her right foot. We changed her to Iodosorb ointment last week 12/13/16; this is a patient I follow every 2 weeks largely on a palliative approach at this point about ischemic wounds currently in the right foot. Initially she had them on her left lateral malleolus and left fifth toe. The area on the left lateral malleolus healed and the fifth toe has a surface eschar that I have elected not to remove. Both of these improved  after revascularization by Dr. Fletcher Anon. We have been using Iodosorb to the right lateral foot not much change here. 12/27/16; the patient had her arterial studies. This showed known bilateral SFA disease. Stable right ABI 0.5 to stable left ABI at 0.7 to the did not do it TBI on the right it was 0.61 on the left she has a stent in the long segment of her left SFA from 05/18/16. All of her wounds are somewhat better. She states her pain is better in the right foot is improved. 01/10/17- patient is here for follow-up dilation of her right lateral foot ulcer. She has been tolerating Iodosorb. She is voices no complaints or concerns 01/24/17; small ischemic wound on right lateral foot. still non viable cover. follows with Dr. Fletcher Anon on 4/30. No open area on left foot Isaac, Estill Bamberg ([id number]) 02/07/17 doing well and states she is pain free. Saw Dr. Fletcher Anon who said she is doing well and has "50% flow in the right leg and 70% in the left. According to her daughter he does not wish to do any further interventions 02/21/17; small ischemic wound on the right lateral foot. Per her daughter and the patient she has no open wound on the left foot and ankle which were her initial presenting wounds. still has claudication type pain at night she is been to see interventional cardiology who does not feel that she has a need for intervention on the right leg at present. She still has claudication type pain in the right leg which she manages at night with when necessary tramadol that I have prescribed 03/14/17; small ischemic wound on the right lateral foot. They've been dressing this at home with Iodosorb ointment. The patient does not complain of any pain. The wound has a surface eschar over it and there is really no visible open area. This is similar to how the areas on the left leg healed 04/11/17; the small ischemic wound on her right lateral foot is finally closed over. Small amount of eschar over the surface however I did  not attempt to remove this. The patient claims to be asymptomatic she is not having any claudication that I'm able to elicit although her activity is limited Readmission: 10/23/17 on evaluation today patient appears to be doing somewhat poorly in regard to her right foot where she has two ulcers at  this point. The worst is on the lateral aspect of her foot medial first metatarsal region is not nearly as bad. With that being said these did arise seemingly out of nowhere she has previously had a similar issue in the past these have always been ischemic wounds due to arterial insufficiency. She does see Dr. Fletcher Anon as her vascular specialist and it has been noted that her ABI's are somewhat low. She actually has a repeat evaluation coming up this March 2019 to reevaluate her blood flow. On the last check 12/22/16 it was noted that patient had stable ABI's in the lower moderate abnormal range in regard to the right in regard to the left and the upper moderate at normal range. Patient is having some discomfort though nothing severe at this point in time. She is seen with her daughter and son-in-law today. No fevers, chills, nausea, or vomiting noted at this time. 10/30/17 on evaluation today patient appears to be doing well in regard to her wounds. The right lateral foot wound appears to show signs of cleaning up nicely there is granulation noted underneath that the bed of the wound although there still some [facility] covering. Nonetheless she still has a lot of discomfort and I really do not want to proceed with debridement due to the fact that she does have so much discomfort. Good news is the tramadol has been helping her at bedtime she only takes this once a day and that has been extremely beneficial. Unfortunately she does not have a primary right now as she is awaiting a new one which is the reason that I am prescribing the tramadol. Nonetheless I'm pleased with how things seem to be progressing in regard  to her ulcers. 11/06/17 on evaluation today patient appears to be doing decently well as far as maintaining in regard to her ulcers. Unfortunately I did review her arterial study which shows that she has on the right arterial obstruction involving the superficial femoral and popliteal artery as well is the superficial femoral artery with a high grade stenosis versus inclusion. Collateral flow noted in the distal portion of the SFA. Severe progression is noted compared to previous study. Unfortunately being the patient's wounds do not seem to be healing as appropriately and quickly as we would like I do believe this is likely a result of poor vascular flow to the right lower extremity. This was discussed with patient and her daughter during the office visit today. 11/14/17 on evaluation today patient continues to have a fairly stalled ulcer in regard to her to ulcers on the right lower extremity. She obviously did have significant findings on her arterial study and I do believe this is again due to poor vascular flow. She does have an appointment later this afternoon with her vascular specialist for further evaluation to see if there any recommendations at that point. With that being said she continues to have discomfort which I do believe is arterial in nature in regard to insufficiency. 11/21/17 on evaluation today patient appears to be doing fairly well in regard to the appearance of her wounds other than the fact that she does have erythema surrounding the wound bed at this point in fact this encompasses the majority of the dorsal surface of her right foot. There is additional granulation noted today that was not noted previously on evaluation and again if that were alone were the findings that we were seeing I would be very happy with the current progress. However unfortunately she is having  the erythema which concerns me for the possibility of infection although the other possibility is that she  could just be having a local reaction due to the I resort. In the past she never had this issue however when she use this for the root left foot which makes me again come back to my concern about infection. Electronic Signature(s) Signed: 11/21/2017 5:22:42 PM By: Worthy Keeler PA-C Entered By: Worthy Keeler on 11/21/2017 13:47:01 [name] ([id number]) [name], [name] ([id number]) -------------------------------------------------------------------------------- Physical Exam Details Patient Name: [name] Date of Service: 11/21/2017 12:30 PM Medical Record Number: [id number] Patient Account Number: [id number] Date of Birth/Sex: 20-Jan-1919 (84 y.o. Female) Treating RN: Carolyne Fiscal, Debi Primary Care Provider: PATIENT, NO Other Clinician: Referring Provider: Referral, Self Treating Provider/Extender: STONE III, HOYT Weeks in Treatment: 4 Constitutional Well-nourished and well-hydrated in no acute distress. Respiratory normal breathing without difficulty. clear to auscultation bilaterally. Cardiovascular regular rate and rhythm with normal S1, S2. Psychiatric this patient is able to make decisions and demonstrates good insight into disease process. Alert and Oriented x 3. pleasant and cooperative. Notes Patient's wound bed continues show evidence of slough buildup but again she does have more granulation coming through than previously noted especially in the lateral portion of her foot wound but also at the first metatarsal site as well. No debridement performed today secondary to the fact that patient is still waiting the intervention from vascular surgery in order to hopefully restore appropriate blood flow. Electronic Signature(s) Signed: 11/21/2017 5:22:42 PM By: Worthy Keeler PA-C Entered By: Worthy Keeler on 11/21/2017 13:48:19 [name] ([id number]) -------------------------------------------------------------------------------- Physician Orders  Details Patient Name: [name] Date of Service: 11/21/2017 12:30 PM Medical Record Number: [id number] Patient Account Number: [id number] Date of Birth/Sex: 05-23-1919 (84 y.o. Female) Treating RN: Carolyne Fiscal, Debi Primary Care Provider: PATIENT, NO Other Clinician: Referring Provider: Referral, Self Treating Provider/Extender: STONE III, HOYT Weeks in Treatment: 4 Verbal / Phone Orders: Yes Clinician: Pinkerton, Debi Read Back and Verified: Yes Diagnosis Coding ICD-10 Coding Code Description I70.235 Atherosclerosis of native arteries of right leg with ulceration of other part of foot I70.232 Atherosclerosis of native arteries of right leg with ulceration of calf L97.512 Non-pressure chronic ulcer of other part of right foot with fat layer exposed I10 Essential (primary) hypertension Wound Cleansing Wound #6 Right,Medial Toe Great o Clean wound with Normal Saline. o Cleanse wound with mild soap and water o May Shower, gently pat wound dry prior to applying new dressing. Wound #7 Right,Dorsal Foot o Clean wound with Normal Saline. o Cleanse wound with mild soap and water o May Shower, gently pat wound dry prior to applying new dressing. Anesthetic (add to Medication List) Wound #6 Right,Medial Toe Great o Topical Lidocaine 4% cream applied to wound bed prior to debridement (In Clinic Only). Wound #7 Right,Dorsal Foot o Topical Lidocaine 4% cream applied to wound bed prior to debridement (In Clinic Only). Skin Barriers/Peri-Wound Care Wound #7 Right,Dorsal Foot o Skin Prep Primary Wound Dressing Wound #6 Right,Medial Toe Great o Santyl Ointment Wound #7 Right,Dorsal Foot o Santyl Ointment Secondary Dressing Wound #6 Right,Medial Toe Great o Other - coverlet Wound #7 Right,Dorsal Foot o Dry Gauze Budzinski, Kaysie ([id number]) o Tegaderm Dressing Change Frequency Wound #6 Right,Medial Toe Great o Change dressing every day. Wound #7  Right,Dorsal Foot o Change dressing every day. Follow-up Appointments Wound #6 Right,Medial Toe Great o Return Appointment in 1 week. Wound #7 Right,Dorsal Foot o Return Appointment in 1 week. Edema Control Wound #6  Right,Medial Toe Great o Elevate legs to the level of the heart and pump ankles as often as possible Wound #7 Right,Dorsal Foot o Elevate legs to the level of the heart and pump ankles as often as possible Off-Loading Wound #6 Right,Medial Toe Great o Turn and reposition every 2 hours Wound #7 Right,Dorsal Foot o Turn and reposition every 2 hours Additional Orders / Instructions Wound #6 Right,Medial Toe Great o Increase protein intake. o Other: - Vitamin C, Zinc Wound #7 Right,Dorsal Foot o Increase protein intake. o Other: - Vitamin C, Zinc Medications-please add to medication list. Wound #7 Right,Dorsal Foot o P.O. Antibiotics - start antibiotics as prescribed o Santyl Enzymatic Ointment Laboratory o Bacteria identified in Wound by Culture (MICRO) oooo LOINC Code: 6834-1 oooo Convenience Name: Wound culture routine Patient Medications Allergies: codeine Romm, Leather ([id number]) Notifications Medication Indication Start End lidocaine DOSE 1 - topical 4 % cream - 1 cream topical Keflex 11/21/2017 DOSE 1 - oral 500 mg capsule - 1 capsule oral taken 2 times a day for 10 days Electronic Signature(s) Signed: 11/21/2017 1:15:08 PM By: Worthy Keeler PA-C Entered By: Worthy Keeler on 11/21/2017 13:15:06 [name] ([id number]) -------------------------------------------------------------------------------- Prescription 11/21/2017 Patient Name: [name] Provider: Worthy Keeler PA-C Date of Birth: [birth year] NPI#: [id number] Sex: F DEA#: [id number] Phone #: [phone] License #: Patient Address: [facility] [place], [place] [address]  [address] [place],  [address] [phone] Allergies codeine Reaction: sick on stomach Severity: Severe Medication Medication: Route: Strength: Form: lidocaine 4 % topical cream topical 4% cream Class: TOPICAL LOCAL ANESTHETICS Dose: Frequency / Time: Indication: 1 1 cream topical Number of Refills: Number of Units: 0 Generic Substitution: Start Date: End Date: One Time Use: Substitution Permitted No Note to Pharmacy: Signature(s): Date(s): Electronic Signature(s) Signed: 11/21/2017 5:22:42 PM By: Worthy Keeler PA-C Entered By: Worthy Keeler on 11/21/2017 13:15:09 [name] ([id number]) --------------------------------------------------------------------------------  Problem List Details Patient Name: [name] Date of Service: 11/21/2017 12:30 PM Medical Record Number: [id number] Patient Account Number: [id number] Date of Birth/Sex: 01-15-1919 (84 y.o. 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 10/23/2017 Yes L97.512 Non-pressure chronic ulcer of other part of right foot with fat layer 10/23/2017 Yes exposed [place] (primary) hypertension 10/24/2017 Yes Inactive Problems Resolved Problems Electronic Signature(s) Signed: 11/21/2017 5:22:42 PM By: Worthy Keeler PA-C Entered By: Worthy Keeler on 11/21/2017 12:55:44 [name] ([id number]) -------------------------------------------------------------------------------- Progress Note Details Patient Name: [name] Date of Service: 11/21/2017 12:30  PM Medical Record Number: [id number] Patient Account Number: [id number] Date of Birth/Sex: 1919-01-21 (84 y.o. Female) Treating RN: Carolyne Fiscal, Debi Primary Care Provider: PATIENT, NO Other Clinician: Referring Provider: Referral, Self Treating Provider/Extender: STONE III, HOYT Weeks in Treatment: 4 Subjective Chief Complaint Information obtained from Patient Right foot Ulcers History of Present Illness (HPI) 04/06/16; this is a 84 year old woman who arrives accompanied by 2 daughters for a wound on her left ankle and her left fifth toe. These have apparently been present for a year. I'm not quite certain how she came to this clinic however she was being followed by Sharlotte Alamo  her podiatrist for these wounds. She was also referred to [facility] and they apparently did a test presumably arterial studies although we don't have any of these results and we couldn't get through to the office today. The family but has been applying a combination of Bactroban and a light bandage and perhaps more recently Silvadene cream. She did have an x-ray of the foot roughly 6 months ago at the podiatry office the family was unaware that if there were any abnormalities. Apparently they have not seen any healing here. Our intake nurse noted a slight skin tear on the right anterior lower leg. Nobody seemed aware of this. ABIs calculated in this clinic was 0.3 on the right and 0.4 on the left I have reviewed things [facility]. There is very little information on this patient. She apparently follows in current total clinic which we don't have information from. She has mentioned already been to a [facility]. She has a history of hypothyroidism, nephrolithiasis arthritis and has had a previous mastectomy. 04/13/16; patient's x-ray was normal. She is already been to see Dr. Delana Meyer vascular surgery. Her arterial exam was from November 2016 this showed a left ABI of 0.62 her right of 0.76. Her duplex  ultrasound of the left leg showed biphasic waves in the common femoral and distal femoral artery however monophasic waves in the superficial femoral artery proximal and mid biphasic it distal. 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 The patient tells me that she has pain at night when she tries to lie down which is improved by getting up and sitting in the chair this sounds like claudication at rest. Her wounds are on the left medial malleolus and the dorsal fifth toe small punched out wounds that are right on bone. We use Santyl last week 04/20/16: nurse informed me pt has declined evaluation for significant PAD. she denies systemic s/s of infections. 04/27/16;; the patient has had noninvasive arterial studies done in November 2016. ABI and the left was 0.62. Monophasic waves at the superficial femoral artery. Occluded to the posterior tibial artery. So had greater than 50% stenosis of the right superficial femoral and greater than 50% stenosis of the left superficial femoral artery. She had bilateral tibial peroneal artery disease. Both of the wounds on the left fifth toe and left lateral malleolus have been present for more than a year. They have been to see vein and vascular. The patient has some pain but miraculously I think the wounds have largely been stable. No evidence of infection 05/04/16; she goes for a noninvasive study tomorrow and then sees Dr. Fletcher Anon on Monday. By the time she is here next week we should have a better picture of whether something can be done with regards to her arterial flow. We continue to have ischemic-looking wounds on the left fifth toe and left lateral malleolus. 05/10/16; the patient went for her arterial studies and saw Dr. Fletcher Anon. As predicted she is felt to have critical limb ischemia. The feeling is that she has occlusion of the SFA. The feeling would she would be a candidate for a stent to the SFA. The patient did not make a decision to proceed with the  procedure and she is here with family members to discuss this me today. He shouldn't has a lot of pain and cannot sleep and rest well at night per her family. 05/24/16; the patient went and had a complex revascularization/angioplasty of the left superficial femoral artery followed by drug-coated balloon angioplasty  and spots stenting. She tolerated the procedure well. She was recommended for dual antiplatelet drugs with Plavix and aspirin for at least a month. She went yesterday for I believe follow-up serial Dopplers and ABIs although I don't see these results. The patient is unfortunately complaining of a lot of pain in her bilateral lower legs below the knees from the ankle to the knees. She apparently was prescribed lidocaine and apparently put this on her legs instead of over the wound areas. This may have something to do with it however there is a lot of edema in her bilateral legs I was able to find her arterial studies from yesterday. The left ABI has improved up to 0.66 post left SFA stent. The bilateral Escalante, Ameila ([id number]) great toe indices remain abnormal with the left being in the 0.25 range. Duplex ultrasound showed her left SFA stent is patent monophasic waveforms persist in the left leg 06/07/16; continued punched-out areas over the dorsal left fifth toe and left lateral malleolus. No major improvement 06/28/16; the areas over her dorsal left fifth toe and left lateral malleolus are covered in surface slough we are using Iodoflex 07/05/16. We have been using Iodoflex for 2-3 weeks now. I have not been debridement is because of pain 07/19/16 currently we have been using Iodoflex for roughly the past month. Previously we were unable to debride due to pain although today patient states that the pain is not nearly as severe as it has been in the past. In fact she rates this to be a 1 out of 10 and it worse to a to 10 with palpation of the wound. All and all her and her daughter feel  like this is actually doing steadily better at this point in time. She is pleased with progress and we have been seeing her every 2 weeks. 08/02/16; this is a delightful 84 year old woman I have not seen in over a month. She has arterial insufficiency wounds remaining over the left lateral malleolus and the left fifth toe. With the help of Dr. Fletcher Anon we are able to get her revascularized on the left. The 2 wounds on the left have been making good progress and are definitely smaller especially the area over the left lateral malleolus. She is certainly in a lot less pain than she used to be although I still think there is some claudication type pain. Unfortunately she is developed a area on the right lateral foot which is a small open area but I think is threatened. I suspect a small ischemic areas well. Also on her right fifth toe there is an area that is not open but looks as though it is receiving too much pressure from footwear. Finally an area over her right malleolus although I don't think this is on its way to anything ominous 08/17/16 patient presents today for follow up evaluation she tells me that she is really doing fairly well from a pain standpoint compared to where she has been previous. She is tolerating the dressing changes without any complication. She continues to have discharge and drainage however. 08-30-16 Ms. Incorvaia presents today with her daughter, she states that she continues to have intermittent shooting pains to the left lateral fifth toe at this site of seems to be a healed ulcer. She denies any other issues that her wound related since her last appointment. Her daughter states that she is in need of a tramadol refill at this time as she uses half a tablet at at bedtime to  aid in sleep due to foot pain. Iodoflex has been used on all wounds in previous dressing changes. 09/13/16; the patient has ischemic wounds in her feet. The area over the left lateral malleolus is healed  and the area over the left fifth toe looks improved.. She has an area on the lateral aspect of her right foot which is a small but deep wound. Finally she has a new open area on the medial aspect of the left medial malleolus. This is superficial 09/27/16; the area over the left lateral malleolus remains healed. The area over the left fifth toe has a surface and she states the pain is better but I don't think this is completely closed. She has an area on the lateral aspect of her right foot is a small but deep wound. The new open area on the medial aspect of the left ankle was closed from last time. 10/18/16; the area over her left lateral malleolus remains healed. The area over the left fifth toe has a surface over the top of this however there is no overt open area. Given the underlying issues of severe PAD and continued pain in the toe I would think it would be unlikely this is truly healed however I am not planning to debridement this area. The area on the right lateral foot which was a more recent wound is a small punched out painful area again has significant surface slough and nonviable tissue. It is clear the patient still has claudication type pain however she remains functional. I have been giving her tramadol when necessary and that seems to help a lot with her pain the patient follows up with Dr. Fletcher Anon on January 18/18 11/01/16; patient missed her follow-up with Dr. Fletcher Anon last week due to a snow day. Follow-up is now on February 15. The areas on the right lateral malleolus and dorsal right fifth toe remain closed over. The fifth toe was tentative is there is a surface eschar however I'm not going to disturb this. Therefore, her only open areas on the right lateral foot. This is a small punched out area. We have been using Prisma. I'm quite convinced this is an ischemic wound 11/15/16; patient has follow-up with Dr. Fletcher Anon on February 15. She has no open wound on the left leg and doesn't  really complain of claudication that I can determine from talking to her. However on the right leg she clearly has some degree of claudication. The remaining open wound is on the right lateral foot. We have been using Iodosorb ointment 11/29/16; patient saw Dr. Fletcher Anon on 11/24/16. His comment is that her wound on her right lateral foot seems to be improving with local wound care. She has known significant right SFA disease. Her lower extremity Doppler will be repeated and according to the patient's daughter that appointment is on March 15. He is left with the thought that endovascular intervention in the right SFA might be necessary. The patient has quite a bit of pain especially at night related to the wound in her right foot. We changed her to Iodosorb ointment last week 12/13/16; this is a patient I follow every 2 weeks largely on a palliative approach at this point about ischemic wounds currently in the right foot. Initially she had them on her left lateral malleolus and left fifth toe. The area on the left lateral malleolus healed and the fifth toe has a surface eschar that I have elected not to remove. Both of these improved after revascularization by  Dr. Fletcher Anon. We have been using Iodosorb to the right lateral foot not much change here. 12/27/16; the patient had her arterial studies. This showed known bilateral SFA disease. Stable right ABI 0.5 to stable left ABI at 0.7 to the did not do it TBI on the right it was 0.61 on the left she has a stent in the long segment of her left SFA from [place], Estill [place]berg ([id number]) 05/18/16. All of her wounds are somewhat better. She states her pain is better in the right foot is improved. 01/10/17- patient is here for follow-up dilation of her right lateral foot ulcer. She has been tolerating Iodosorb. She is voices no complaints or concerns 01/24/17; small ischemic wound on right lateral foot. still non viable cover. follows with Dr. Fletcher Anon on 4/30. No open area on  left foot 02/07/17 doing well and states she is pain free. Saw Dr. Fletcher Anon who said she is doing well and has "50% flow in the right leg and 70% in the left. According to her daughter he does not wish to do any further interventions 02/21/17; small ischemic wound on the right lateral foot. Per her daughter and the patient she has no open wound on the left foot and ankle which were her initial presenting wounds. still has claudication type pain at night she is been to see interventional cardiology who does not feel that she has a need for intervention on the right leg at present. She still has claudication type pain in the right leg which she manages at night with when necessary tramadol that I have prescribed 03/14/17; small ischemic wound on the right lateral foot. They've been dressing this at home with Iodosorb ointment. The patient does not complain of any pain. The wound has a surface eschar over it and there is really no visible open area. This is similar to how the areas on the left leg healed 04/11/17; the small ischemic wound on her right lateral foot is finally closed over. Small amount of eschar over the surface however I did not attempt to remove this. The patient claims to be asymptomatic she is not having any claudication that I'm able to elicit although her activity is limited Readmission: 10/23/17 on evaluation today patient appears to be doing somewhat poorly in regard to her right foot where she has two ulcers at this point. The worst is on the lateral aspect of her foot medial first metatarsal region is not nearly as bad. With that being said these did arise seemingly out of nowhere she has previously had a similar issue in the past these have always been ischemic wounds due to arterial insufficiency. She does see Dr. Fletcher Anon as her vascular specialist and it has been noted that her ABI's are somewhat low. She actually has a repeat evaluation coming up this March 2019 to reevaluate her  blood flow. On the last check 12/22/16 it was noted that patient had stable ABI's in the lower moderate abnormal range in regard to the right in regard to the left and the upper moderate at normal range. Patient is having some discomfort though nothing severe at this point in time. She is seen with her daughter and son-in-law today. No fevers, chills, nausea, or vomiting noted at this time. 10/30/17 on evaluation today patient appears to be doing well in regard to her wounds. The right lateral foot wound appears to show signs of cleaning up nicely there is granulation noted underneath that the bed of the wound although there still some  Slough covering. Nonetheless she still has a lot of discomfort and I really do not want to proceed with debridement due to the fact that she does have so much discomfort. Good news is the tramadol has been helping her at bedtime she only takes this once a day and that has been extremely beneficial. Unfortunately she does not have a primary right now as she is awaiting a new one which is the reason that I am prescribing the tramadol. Nonetheless I'm pleased with how things seem to be progressing in regard to her ulcers. 11/06/17 on evaluation today patient appears to be doing decently well as far as maintaining in regard to her ulcers. Unfortunately I did review her arterial study which shows that she has on the right arterial obstruction involving the superficial femoral and popliteal artery as well is the superficial femoral artery with a high grade stenosis versus inclusion. Collateral flow noted in the distal portion of the SFA. Severe progression is noted compared to previous study. Unfortunately being the patient's wounds do not seem to be healing as appropriately and quickly as we would like I do believe this is likely a result of poor vascular flow to the right lower extremity. This was discussed with patient and her daughter during the office visit today. 11/14/17  on evaluation today patient continues to have a fairly stalled ulcer in regard to her to ulcers on the right lower extremity. She obviously did have significant findings on her arterial study and I do believe this is again due to poor vascular flow. She does have an appointment later this afternoon with her vascular specialist for further evaluation to see if there any recommendations at that point. With that being said she continues to have discomfort which I do believe is arterial in nature in regard to insufficiency. 11/21/17 on evaluation today patient appears to be doing fairly well in regard to the appearance of her wounds other than the fact that she does have erythema surrounding the wound bed at this point in fact this encompasses the majority of the dorsal surface of her right foot. There is additional granulation noted today that was not noted previously on evaluation and again if that were alone were the findings that we were seeing I would be very happy with the current progress. However unfortunately she is having the erythema which concerns me for the possibility of infection although the other possibility is that she could just be having a local reaction due to the I resort. In the past she never had this issue however when she use this for the root left foot which makes me again come back to my concern about infection. [name], [name] ([id number]) Patient History Information obtained from Patient. Family History Diabetes - Mother, Heart Disease - Siblings, Stroke - Siblings, No family history of Cancer, Hereditary Spherocytosis, Hypertension, Kidney Disease, Lung Disease, Seizures, Thyroid Problems, Tuberculosis. Social History Never smoker, Marital Status - Widowed, Alcohol Use - Never, Drug Use - No History, Caffeine Use - Daily. Review of Systems (ROS) Constitutional Symptoms (General Health) Denies complaints or symptoms of Fever, Chills. Respiratory The patient has no  complaints or symptoms. Cardiovascular The patient has no complaints or symptoms. Psychiatric The patient has no complaints or symptoms. Objective Constitutional Well-nourished and well-hydrated in no acute distress. Vitals Time Taken: 12:43 PM, Height: 64 in, Weight: 156 lbs, BMI: 26.8, Temperature: 98.4 F, Pulse: 81 bpm, Respiratory Rate: 16 breaths/min, Blood Pressure: 134/87 mmHg. Respiratory normal breathing without difficulty. clear to  auscultation bilaterally. Cardiovascular regular rate and rhythm with normal S1, S2. Psychiatric this patient is able to make decisions and demonstrates good insight into disease process. Alert and Oriented x 3. pleasant and cooperative. General Notes: Patient's wound bed continues show evidence of slough buildup but again she does have more granulation coming through than previously noted especially in the lateral portion of her foot wound but also at the first metatarsal site as well. No debridement performed today secondary to the fact that patient is still waiting the intervention from vascular surgery in order to hopefully restore appropriate blood flow. Integumentary (Hair, Skin) Wound #6 status is Open. Original cause of wound was Gradually Appeared. The wound is located on the [employer]. The wound measures 0.5cm length x 0.2cm width x 0.1cm depth; 0.079cm^2 area and 0.008cm^3 volume. There is no tunneling or undermining noted. There is a large amount of serous drainage noted. The wound margin is distinct with the [facility], Terrilee ([id number]) outline attached to the wound base. There is large (67-100%) red granulation within the wound bed. There is a small (1-33%) amount of necrotic tissue within the wound bed including Eschar. Periwound temperature was noted as No Abnormality. The periwound has tenderness on palpation. Wound #7 status is Open. Original cause of wound was Gradually Appeared. The wound is located on the  Right,Dorsal Foot. The wound measures 1.2cm length x 1.5cm width x 0.3cm depth; 1.414cm^2 area and 0.424cm^3 volume. There is Fat Layer (Subcutaneous Tissue) Exposed exposed. There is no tunneling or undermining noted. There is a large amount of purulent drainage noted. The wound margin is distinct with the outline attached to the wound base. There is small (1-33%) pink granulation within the wound bed. There is a large (67-100%) amount of necrotic tissue within the wound bed including Adherent Slough. The periwound skin appearance exhibited: Induration, Maceration, Erythema. The surrounding wound skin color is noted with erythema which is circumferential. Periwound temperature was noted as No Abnormality. The periwound has tenderness on palpation. 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 Cleanse wound with mild soap and water May Shower, gently pat wound dry prior to applying new dressing. Wound #7 Right,Dorsal Foot: Clean wound with Normal Saline. Cleanse wound with mild soap and water May Shower, gently pat wound dry prior to applying new dressing. Anesthetic (add to Medication List): Wound #6 Right,Medial Toe Great: Topical Lidocaine 4% cream applied to wound bed prior to debridement (In Clinic Only). Wound #7 Right,Dorsal Foot: Topical Lidocaine 4% cream applied to wound bed prior to debridement (In Clinic Only). Skin Barriers/Peri-Wound Care: Wound #7 Right,Dorsal Foot: Skin Prep Primary Wound Dressing: Wound #6 Right,Medial Toe Great: Santyl Ointment Wound #7 Right,Dorsal Foot: Santyl  Ointment Secondary Dressing: Wound #6 Right,Medial Toe Great: Other - coverlet Filip, Sharaine ([id number]) Wound #7 Right,Dorsal Foot: Dry Gauze Tegaderm Dressing Change Frequency: Wound #6 Right,Medial Toe Great: Change dressing every day. Wound #7 Right,Dorsal Foot: Change dressing every day. Follow-up Appointments: Wound #6 Right,Medial Toe Great: Return Appointment in 1 week. Wound #7 Right,Dorsal Foot: Return Appointment in 1 week. Edema Control: Wound #6 Right,Medial Toe Great: Elevate legs to the level of the heart and  pump ankles as often as possible Wound #7 Right,Dorsal Foot: Elevate legs to the level of the heart and pump ankles as often as possible Off-Loading: Wound #6 Right,Medial Toe Great: Turn and reposition every 2 hours Wound #7 Right,Dorsal Foot: Turn and reposition every 2 hours Additional Orders / Instructions: Wound #6 Right,Medial Toe Great: Increase protein intake. Other: - Vitamin C, Zinc Wound #7 Right,Dorsal Foot: Increase protein intake. Other: - Vitamin C, Zinc Medications-please add to medication list.: Wound #7 Right,Dorsal Foot: P.O. Antibiotics - start antibiotics as prescribed Santyl Enzymatic Ointment Laboratory ordered were: Wound culture routine The following medication(s) was prescribed: lidocaine topical 4 % cream 1 1 cream topical was prescribed at facility Keflex oral 500 mg capsule 1 1 capsule oral taken 2 times a day for 10 days starting 11/21/2017 At this point I'm going to switch back to the [facility] for this weeks time. I'm also going to go ahead and send in a prescription for Keflex that she has taken as previously without complication hopefully this will help with her cellulitis in regard to the right foot. Or at least what potentially may be a cellulitis situation versus just inflammation secondary to the Iodoflex. Unfortunately either way the Iodoflex actually caused her discomfort which in the past it really did not this  was a unfortunate finding because the wound did appear to be looking better. Nonetheless we will see her for reevaluation in one weeks time to see were things stand hopefully she will be able to proceed with her vascular intervention tomorrow as I do believe restoring appropriate blood flow would make all the difference in the world in regard to this wound healing. We will see were things stand in one weeks time. Please see above for specific wound care orders. We will see patient for re-evaluation in 1 week(s) here in the clinic. If anything worsens or changes patient will contact our office for additional recommendations. [name], [name] ([id number]) Electronic Signature(s) Signed: 11/21/2017 5:22:42 PM By: Worthy Keeler PA-C Entered By: Worthy Keeler on 11/21/2017 13:49:56 [name] ([id number]) -------------------------------------------------------------------------------- ROS/PFSH Details Patient Name: [name] Date of Service: 11/21/2017 12:30 PM Medical Record Number: [id number] Patient Account Number: [id number] Date of Birth/Sex: 05-14-1919 (84 y.o. Female) Treating RN: Carolyne Fiscal, Debi Primary Care Provider: PATIENT, NO Other Clinician: Referring Provider: Referral, Self Treating Provider/Extender: STONE III, HOYT Weeks in Treatment: 4 Information Obtained From Patient Wound History Do you currently have one or more open woundso Yes How many open wounds do you currently haveo 1 Approximately how long have you had your woundso 1.5 weeks How have you been treating your wound(s) until nowo bandaide Has your wound(s) ever healed and then re-openedo No Have you had any lab work done in the past montho No Have you tested positive for an antibiotic resistant organism (MRSA, VRE)o No Have you tested positive for osteomyelitis (bone infection)o No Have you had any tests for circulation on your legso Yes Where was the test [facility] Have you had other problems  associated with your woundso Swelling Constitutional Symptoms (General Health) Complaints and Symptoms: Negative for: Fever; Chills Eyes Medical History: Positive for: Cataracts - had surgery Respiratory Complaints and Symptoms: No Complaints or Symptoms Cardiovascular Complaints and Symptoms: No Complaints or Symptoms Medical History: Positive for: Hypertension Musculoskeletal Medical History: Positive for: Osteoarthritis Oncologic Medical History: Negative for: Received Chemotherapy; Received Radiation Psychiatric [name], [name] ([id number]) Complaints and Symptoms: No Complaints or Symptoms HBO Extended History Items Eyes: Cataracts Immunizations Pneumococcal Vaccine: Received Pneumococcal  Vaccination: Yes Implantable Devices Family and Social History Cancer: No; Diabetes: Yes - Mother; Heart Disease: Yes - Siblings; Hereditary Spherocytosis: No; Hypertension: No; Kidney Disease: No; Lung Disease: No; Seizures: No; Stroke: Yes - Siblings; Thyroid Problems: No; Tuberculosis: No; Never smoker; Marital Status - Widowed; Alcohol Use: Never; Drug Use: No History; Caffeine Use: Daily; Financial Concerns: No; Food, Clothing or Shelter Needs: No; Support System Lacking: No; Transportation Concerns: No; Advanced Directives: No; Patient does not want information on Advanced Directives; Do not resuscitate: No; Living Will: No; Medical Power of Attorney: No Physician Affirmation I have reviewed and agree with the above information. Electronic Signature(s) Signed: 11/21/2017 4:46:02 PM By: Alric Quan Signed: 11/21/2017 5:22:42 PM By: Worthy Keeler PA-C Entered By: Worthy Keeler on 11/21/2017 13:47:30 [name] ([id number]) -------------------------------------------------------------------------------- SuperBill Details Patient Name: [name] Date of Service: 11/21/2017 Medical Record Number: [id number] Patient Account Number: [id number] Date of  Birth/Sex: 1919/07/03 (84 y.o. Female) Treating RN: Carolyne Fiscal, Debi Primary Care Provider: PATIENT, NO Other Clinician: Referring Provider: Referral, Self Treating Provider/Extender: STONE III, HOYT Weeks in Treatment: 4 Diagnosis Coding ICD-10 Codes Code Description I70.235 Atherosclerosis of native arteries of right leg with ulceration of other part of foot I70.232 Atherosclerosis of native arteries of right leg with ulceration of calf L97.512 Non-pressure chronic ulcer of other part of right foot with fat layer exposed I10 Essential (primary) hypertension Facility Procedures CPT4 Code: 02542706 Description: 99213 - WOUND CARE VISIT-LEV 3 EST PT Modifier: Quantity: 1 Physician Procedures CPT4 Code Description: 2376283 15176 - WC PHYS LEVEL 3 - EST PT ICD-10 Diagnosis Description I70.235 Atherosclerosis of native arteries of right leg with ulceration o I70.232 Atherosclerosis of native arteries of right leg with ulceration o L97.512  Non-pressure chronic ulcer of other part of right foot with fat l I10 Essential (primary) hypertension Modifier: f other part of fo f calf ayer exposed Quantity: 1 ot Electronic Signature(s) Signed: 11/21/2017 4:24:49 PM By: Alric Quan Signed: 11/21/2017 5:22:42 PM By: Worthy Keeler PA-C Entered By: Alric Quan on 11/21/2017 16:24:49

## 2017-11-22 NOTE — Interval H&P Note (Signed)
History and Physical Interval Note:  11/22/2017 9:42 AM  [name]  has presented today for surgery, with the diagnosis of pvd  The various methods of treatment have been discussed with the patient and family. After consideration of risks, benefits and other options for treatment, the patient has consented to  Procedure(s): ABDOMINAL AORTOGRAM W/LOWER EXTREMITY (N/A) as a surgical intervention .  The patient's history has been reviewed, patient examined, no change in status, stable for surgery.  I have reviewed the patient's chart and labs.  Questions were answered to the patient's satisfaction.     [name]

## 2017-11-23 ENCOUNTER — Other Ambulatory Visit: Payer: Self-pay | Admitting: Cardiology

## 2017-11-23 ENCOUNTER — Encounter (HOSPITAL_COMMUNITY): Payer: Self-pay | Admitting: Cardiovascular Disease

## 2017-11-23 DIAGNOSIS — I998 Other disorder of circulatory system: Secondary | ICD-10-CM

## 2017-11-23 DIAGNOSIS — I70235 Atherosclerosis of native arteries of right leg with ulceration of other part of foot: Secondary | ICD-10-CM | POA: Diagnosis not present

## 2017-11-23 DIAGNOSIS — I739 Peripheral vascular disease, unspecified: Secondary | ICD-10-CM

## 2017-11-23 LAB — BASIC METABOLIC PANEL
Anion gap: 11 (ref 5–15)
BUN: 11 mg/dL (ref 6–20)
CHLORIDE: 111 mmol/L (ref 101–111)
CO2: 20 mmol/L — AB (ref 22–32)
Calcium: 8.6 mg/dL — ABNORMAL LOW (ref 8.9–10.3)
Creatinine, Ser: 0.7 mg/dL (ref 0.44–1.00)
GFR calc Af Amer: >60 mL/min (ref >60)
GFR calc non Af Amer: >60 mL/min (ref >60)
Glucose, Bld: 110 mg/dL — ABNORMAL HIGH (ref 65–99)
POTASSIUM: 3.8 mmol/L (ref 3.5–5.1)
Sodium: 142 mmol/L (ref 135–145)

## 2017-11-23 MED ORDER — ATORVASTATIN CALCIUM 20 MG PO TABS: 20.0000 mg | ORAL_TABLET | Freq: Every day | ORAL | 1 refills | Status: DC

## 2017-11-23 MED ORDER — ASPIRIN 81 MG PO TBEC: 81.0000 mg | DELAYED_RELEASE_TABLET | Freq: Every day | ORAL | Status: DC

## 2017-11-23 MED FILL — Nitroglycerin IV Soln 200 MCG/ML in D5W: INTRAVENOUS | Qty: 250 | Status: AC

## 2017-11-23 MED FILL — Verapamil HCl IV Soln 2.5 MG/ML: INTRAVENOUS | Qty: 2 | Status: AC

## 2017-11-23 MED FILL — Lidocaine HCl Local Inj 1%: INTRAMUSCULAR | Qty: 20 | Status: AC

## 2017-11-23 MED FILL — Heparin Sodium (Porcine) 2 Unit/ML in Sodium Chloride 0.9%: INTRAMUSCULAR | Qty: 1000 | Status: AC

## 2017-11-23 NOTE — Care Management Note (Signed)
Case Management Note  Patient Details  Name: [name] MRN: [id number] Date of Birth: 01-Jul-1919  Subjective/Objective:    From home, she has family that is with her at all times, they take shifts. Daughter states she is never alone.  Daughter is in process of getting her a new PCP, NCM gave her the [facility] information to help assist her with that.  She is s/p pv atherectomy. Will be on plavix.                  Action/Plan: DC home today.  Expected Discharge Date:                  Expected Discharge Plan:  Home/Self Care  In-House Referral:     Discharge planning Services  CM Consult  Post Acute Care Choice:    Choice offered to:     DME Arranged:    DME Agency:     HH Arranged:    [place] Agency:     Status of Service:  Completed, signed off  If discussed at [employer] of Stay Meetings, dates discussed:    Additional Comments:  Zenon Mayo, RN 11/23/2017, 9:52 AM

## 2017-11-23 NOTE — Discharge Summary (Signed)
Discharge Summary    Patient ID: [name],  MRN: [id number], DOB/AGE: [birth year] 84 y.o.  Admit date: 11/22/2017 Discharge date: 11/23/2017  Primary Care Provider: Patient, No Pcp Per Primary Cardiologist: Dr. Fletcher Anon  Discharge Diagnoses    Active Problems:   Critical lower limb ischemia  Allergies Allergies  Allergen Reactions  . Codeine Nausea And Vomiting    Diagnostic Studies/Procedures    PV angiogram: 11/22/17  Conclusion   1.  No significant aortoiliac disease. 2.  Right lower extremity: Moderate calcified disease involving the proximal SFA followed by short occlusion distally with moderate disease in the popliteal artery and one-vessel runoff below the knee via the peroneal artery with severe disease involving the TP trunk into the ostium of the peroneal artery. 3.  Successful orbital atherectomy and drug-coated balloon angioplasty to the right SFA, TP trunk and proximal peroneal artery.  Recommendations: Continue dual antiplatelet therapy.  Aggressive treatment of risk factors in wound care.  Possible discharge home tomorrow.  _____________   History of Present Illness     84 y.o. female who presented to the office with Dr. Fletcher Anon for a follow-up visit regarding peripheral arterial disease. The patient has no previous cardiac history and no history of diabetes or tobacco use. She is known to have hypertension, hyperlipidemia and hypothyroidism.   She was seen in 2017 for nonhealing wounds on the left foot.  Noninvasive vascular evaluation showed an ABI of 0.55 on the right and 0.40 on the left. Duplex showed relatively long occlusion of the mid to distal left SFA with two-vessel runoff below the knee. Angiography in August, 2017 showed no significant aortoiliac disease. There was long occlusion of the left SFA with 1 vessel runoff below the knee via the peroneal artery which was occluded but gave collaterals to the dorsalis pedis. Dr. Fletcher Anon performed  successful angioplasty of the left SFA followed by drug-coated balloon angioplasty and spot stenting with self-expanding stent in the midsegment. The ulcers on the left foot had healed completely.   She presented back with nonhealing ulceration on the right foot with severe rest pain.  She had a deep ulcer on the right lateral foot as well as a superficial ulcer on the big toe.  She had been going to the wound clinic with no significant improvement.  She has had severe pain especially at night.  No symptoms on the left side. She underwent a vascular studies which showed severely reduced ABI on the right 0.4 with evidence of high-grade stenosis in the right distal SFA or occlusion. Given findings she was set up for outpatient PV angiogram.   Hospital Course     Underwent successful orbital atherectomy and drug coated balloon angioplasty to the right SFA, TP trunk and proximal peroneal artery. Plan to continue DAPT with ASA/plavix. Also added low dose atorvastatin 20mg  post cath. No complications noted post cath and able to ambulate with a walker. Morning labs were stable.  General: Thin, frail older female appearing in no acute distress. Head: Normocephalic, atraumatic.  Neck: Supple without bruits, JVD. Lungs:  Resp regular and unlabored, CTA. Heart: RRR, S1, S2, soft systolic murmur; no rub. Abdomen: Soft, non-tender, non-distended with normoactive bowel sounds.  Extremities: No clubbing, cyanosis, edema. Distal pedal pulses are 2+ bilaterally. L femoral cath site stable without bruising or hematoma Neuro: Alert and oriented X 3. Moves all extremities spontaneously. Psych: Normal affect.  [name] was seen by Dr. Gwenlyn Found and determined stable for discharge home. Follow up  in the office has been arranged. Medications are listed below.   _____________  Discharge Vitals Blood pressure (!) 157/49, pulse 95, temperature 98.3 F (36.8 C), temperature source Oral, resp. rate 18, height 5'  4" (1.626 m), weight 152 lb 1.9 oz (69 kg), SpO2 93 %.  Filed Weights   11/22/17 0843 11/23/17 0525  Weight: 156 lb (70.8 kg) 152 lb 1.9 oz (69 kg)    Labs & Radiologic Studies    CBC No results for input(s): WBC, NEUTROABS, HGB, HCT, MCV, PLT in the last 72 hours. Basic Metabolic Panel Recent Labs    11/23/17 0627  NA 142  K 3.8  CL 111  CO2 20*  GLUCOSE 110*  BUN 11  CREATININE 0.70  CALCIUM 8.6*   Liver Function Tests No results for input(s): AST, ALT, ALKPHOS, BILITOT, PROT, ALBUMIN in the last 72 hours. No results for input(s): LIPASE, AMYLASE in the last 72 hours. Cardiac Enzymes No results for input(s): CKTOTAL, CKMB, CKMBINDEX, TROPONINI in the last 72 hours. BNP Invalid input(s): POCBNP D-Dimer No results for input(s): DDIMER in the last 72 hours. Hemoglobin A1C No results for input(s): HGBA1C in the last 72 hours. Fasting Lipid Panel No results for input(s): CHOL, HDL, LDLCALC, TRIG, CHOLHDL, LDLDIRECT in the last 72 hours. Thyroid Function Tests No results for input(s): TSH, T4TOTAL, T3FREE, THYROIDAB in the last 72 hours.  Invalid input(s): FREET3 _____________    Disposition   Pt is being discharged home today in good condition.  Follow-up Plans & Appointments    Follow-up Information    Wellington Hampshire, MD Follow up on 12/15/2017.   Specialty:  Cardiology Why:  at 3pm for your follow up appt.  Contact information: [place] [id number] [phone]        [place] Follow up on 12/08/2017.   Why:  at 1pm for your follow up dopplers.  Contact information: [facility] [address] [place] [address]         Discharge Instructions    Call MD for:  redness, tenderness, or signs of infection (pain, swelling, redness, odor or green/yellow discharge around incision site)   Complete by:  As directed    Diet - low sodium heart healthy   Complete by:  As directed     Discharge instructions   Complete by:  As directed    Groin Site Care Refer to this sheet in the next few weeks. These instructions provide you with information on caring for yourself after your procedure. Your caregiver may also give you more specific instructions. Your treatment has been planned according to current medical practices, but problems sometimes occur. Call your caregiver if you have any problems or questions after your procedure. HOME CARE INSTRUCTIONS You may shower 24 hours after the procedure. Remove the bandage (dressing) and gently wash the site with plain soap and water. Gently pat the site dry.  Do not apply powder or lotion to the site.  Do not sit in a bathtub, swimming pool, or whirlpool for 5 to 7 days.  No bending, squatting, or lifting anything over 10 pounds (4.5 kg) as directed by your caregiver.  Inspect the site at least twice daily.  Do not drive home if you are discharged the same day of the procedure. Have someone else drive you.  You may drive 24 hours after the procedure unless otherwise instructed by your caregiver.  What to expect: Any bruising will  usually fade within 1 to 2 weeks.  Blood that collects in the tissue (hematoma) may be painful to the touch. It should usually decrease in size and tenderness within 1 to 2 weeks.  SEEK IMMEDIATE MEDICAL CARE IF: You have unusual pain at the groin site or down the affected leg.  You have redness, warmth, swelling, or pain at the groin site.  You have drainage (other than a small amount of blood on the dressing).  You have chills.  You have a fever or persistent symptoms for more than 72 hours.  You have a fever and your symptoms suddenly get worse.  Your leg becomes pale, cool, tingly, or numb.  You have heavy bleeding from the site. Hold pressure on the site. Marland Kitchen  PLEASE DO NOT MISS ANY DOSES OF YOUR PLAVIX!!!!! Also keep a log of you blood pressures and bring back to your follow up appt. Please call the  office with any questions.   Patients taking blood thinners should generally stay away from medicines like ibuprofen, Advil, Motrin, naproxen, and Aleve due to risk of stomach bleeding. You may take Tylenol as directed or talk to your primary doctor about alternatives.   Increase activity slowly   Complete by:  As directed       Discharge Medications     Medication List    STOP taking these medications   nortriptyline 25 MG capsule Commonly known as:  PAMELOR     TAKE these medications   acetaminophen 500 MG tablet Commonly known as:  TYLENOL Take 1,000 mg by mouth every 6 (six) hours as needed for moderate pain or headache.   amLODipine 5 MG tablet Commonly known as:  NORVASC Take 5 mg by mouth at bedtime.   aspirin 81 MG EC tablet Take 1 tablet (81 mg total) by mouth daily.   atorvastatin 20 MG tablet Commonly known as:  LIPITOR Take 1 tablet (20 mg total) by mouth daily at 6 PM.   cadexomer iodine 0.9 % gel Commonly known as:  IODOSORB Apply 1 application topically every other day.   cetirizine 10 MG tablet Commonly known as:  ZYRTEC Take 10 mg by mouth daily as needed for allergies.   clopidogrel 75 MG tablet Commonly known as:  PLAVIX Take 1 tablet (75 mg total) by mouth daily with breakfast.   EYE VITAMINS PO Take 1 capsule by mouth daily.   KEFLEX 500 MG capsule Generic drug:  cephALEXin Take 500 mg by mouth 2 (two) times daily.   levothyroxine 50 MCG tablet Commonly known as:  SYNTHROID, LEVOTHROID Take 50 mcg by mouth daily with breakfast.   losartan 50 MG tablet Commonly known as:  COZAAR Take 50 mg by mouth at bedtime.   SYSTANE OP Place 1 drop into both eyes daily as needed (for dry eyes).   traMADol 50 MG tablet Commonly known as:  ULTRAM Take 50 mg by mouth at bedtime.   VITAMIN B-12 PO Take 1 tablet by mouth every other day.       Outstanding Labs/Studies   FLP/LFTs in 6 weeks if tolerating statin.   Duration of Discharge  Encounter   Greater than 30 minutes including physician time.  Signed, Reino Bellis NP-C 11/23/2017, 10:02 AM  Agree with note by Reino Bellis NP-C  Stable for discharge home today status post complex right lower extremity PTA and stenting as well as rotational atherectomy for critical limb ischemia. Her left groin site is well-healed. She has no pain. She will  follow-up with Dr. Fletcher Anon in the office.  Lorretta Harp, M.D., [place], [facility], [place] [address] [place], [place]  [address]  [phone] 11/23/2017 10:11 AM

## 2017-11-24 ENCOUNTER — Telehealth: Payer: Self-pay | Admitting: Cardiovascular Disease

## 2017-11-24 LAB — AEROBIC CULTURE  (SUPERFICIAL SPECIMEN)

## 2017-11-24 LAB — AEROBIC CULTURE W GRAM STAIN (SUPERFICIAL SPECIMEN): Gram Stain: NONE SEEN

## 2017-11-24 NOTE — Telephone Encounter (Signed)
Patient had a procedure done by Dr Fletcher Anon recently The site has a lot of redness and has been quite painful Patient daughter just wants to make sure it is nothing to be alarmed about Please call

## 2017-11-24 NOTE — Telephone Encounter (Signed)
Patient's daughter, [name], called to report top of patient's right foot and half way up right lower extremity is red and warm. She denies any swelling. Patient had PV Cath, left lower extremity, on Feb 13. Discharged home 2/14.  She reports there is also some redness in left leg. Patient is taking Keflex 500mg  BID x 10 days. Reviewed with Dr. Fletcher Anon who reports nothing is needed at this time. [name] should continue to monitor and call if further concerns or sx worsen. Informed [name] who verbalized understanding and is appreciative of the information.

## 2017-11-24 NOTE — Telephone Encounter (Signed)
Pt daughter calling stating the redness and pain is in the foot It is in her left foot   Would like to know what to do about this.

## 2017-11-27 ENCOUNTER — Telehealth: Payer: Self-pay | Admitting: Cardiovascular Disease

## 2017-11-27 MED ORDER — PREDNISONE 20 MG PO TABS: ORAL_TABLET | ORAL | 0 refills | Status: DC

## 2017-11-27 NOTE — Telephone Encounter (Signed)
S/w daughter [name], ok per DPR. States patient developed a "red freckle-like under the skin rash" on Friday night into Saturday. It is itchy. It is from head to toe, on scalp, back , stomach, arms and legs. Even some on her face. Pharmacy suggested patient take Benadryl 12.5 mg which she did twice on Saturday and once on Sunday. Family also had patient bath in an oatmeal bath and put lotion on her.  Update on legs from telephone call Friday, the legs are still red from the knee down bilaterally. No pain or swelling. Patient is acting like herself and no problems with eating or drinking. She is staying well hydrated.  They were concerned it could be from the Keflex so patient's last dose was Friday.  Patient does not appear in much discomfort at this time. Family would like to know next steps or if they need to "ride it out." Advised I will route to Dr Fletcher Anon for review.

## 2017-11-27 NOTE — Telephone Encounter (Signed)
Pt daughter calling stating pt has a rash she states from head to toe.   She called on the call here in Saturday and we advised her to take patient to Urgent care She states she was not able to do this for patient is 89 and did not want her to catch anything else.  Would like some advise on this for its now been two days like this  Please call back

## 2017-11-27 NOTE — Telephone Encounter (Signed)
This could be a reaction to Keflex or even contrast dye.  I suggest that we give her prednisone 20 mg once daily to be used for 3 days.  Continue to use Benadryl as needed for itching.

## 2017-11-27 NOTE — Telephone Encounter (Signed)
S/w patient's daughter, [name]. She verbalized understanding to take prednisone once a day for 3 days and to continue benadryl as needed. Rx sent to pharmacy.

## 2017-11-28 ENCOUNTER — Encounter: Payer: Medicare PPO | Admitting: Physician Assistant

## 2017-11-28 DIAGNOSIS — I70235 Atherosclerosis of native arteries of right leg with ulceration of other part of foot: Secondary | ICD-10-CM | POA: Diagnosis not present

## 2017-11-29 NOTE — Progress Notes (Signed)
SHERYLL, DYMEK ([id number]) Visit Report for 11/28/2017 Chief Complaint Document Details Patient Name: [name], [name] Date of Service: 11/28/2017 9:15 AM Medical Record Number: [id number] Patient Account Number: [id number] Date of Birth/Sex: 01-Jun-1919 (84 y.o. Female) Treating RN: Carolyne Fiscal, Debi Primary Care Provider: PATIENT, NO Other Clinician: Referring Provider: Referral, Self Treating Provider/Extender: STONE III, HOYT Weeks in Treatment: 5 Information Obtained from: Patient Chief Complaint Right foot Ulcers Electronic Signature(s) Signed: 11/28/2017 5:27:42 PM By: Worthy Keeler PA-C Entered By: Worthy Keeler on 11/28/2017 09:54:44 [name] ([id number]) -------------------------------------------------------------------------------- HPI Details Patient Name: [name] Date of Service: 11/28/2017 9:15 AM Medical Record Number: [id number] Patient Account Number: [id number] Date of Birth/Sex: 08-Aug-1919 (84 y.o. Female) Treating RN: Carolyne Fiscal, Debi Primary Care Provider: PATIENT, NO Other Clinician: Referring Provider: Referral, Self Treating Provider/Extender: STONE III, HOYT Weeks in Treatment: 5 History of Present Illness HPI Description: 04/06/16; this is a 84 year old woman who arrives accompanied by 2 daughters for a wound on her left ankle and her left fifth toe. These have apparently been present for a year. I'm not quite certain how she came to this clinic however she was being followed by Sharlotte Alamo her podiatrist for these wounds. She was also referred to [facility] and they apparently did a test presumably arterial studies although we don't have any of these results and we couldn't get through to the office today. The family but has been applying a combination of Bactroban and a light bandage and perhaps more recently Silvadene cream. She did have an x-ray of the foot roughly 6 months ago at the podiatry office the family was unaware  that if there were any abnormalities. Apparently they have not seen any healing here. Our intake nurse noted a slight skin tear on the right anterior lower leg. Nobody seemed aware of this. ABIs calculated in this clinic was 0.3 on the right and 0.4 on the left I have reviewed things in [facility]. There is very little information on this patient. She apparently follows in current [facility] clinic which we don't have information from. She has mentioned already been to a [facility]. She has a history of hypothyroidism, nephrolithiasis arthritis and has had a previous mastectomy. 04/13/16; patient's x-ray was normal. She is already been to see Dr. Delana Meyer vascular surgery. Her arterial exam was from November 2016 this showed a left ABI of 0.62 her right of 0.76. Her duplex ultrasound of the left leg showed biphasic waves in the common femoral and distal femoral artery however monophasic waves in the superficial femoral artery proximal and mid biphasic it distal. 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 The patient tells me that she has pain at night when she tries to lie down which is improved by getting up and sitting in the chair this sounds like claudication at rest. Her wounds are on the left medial malleolus and the dorsal fifth toe small punched out wounds that are right on bone. We use Santyl last week 04/20/16: nurse informed me pt has declined evaluation for significant PAD. she denies systemic s/s of infections. 04/27/16;; the patient has had noninvasive arterial studies done in November 2016. ABI and the left was 0.62. Monophasic waves at the superficial femoral artery. Occluded to the posterior tibial artery. So had greater than 50% stenosis of the right superficial femoral and greater than 50% stenosis of the left superficial femoral artery. She had bilateral tibial peroneal artery disease. Both of the wounds on the left fifth toe and left lateral malleolus have  been present for more than a year.  They have been to see vein and vascular. The patient has some pain but miraculously I think the wounds have largely been stable. No evidence of infection 05/04/16; she goes for a noninvasive study tomorrow and then sees Dr. Fletcher Anon on Monday. By the time she is here next week we should have a better picture of whether something can be done with regards to her arterial flow. We continue to have ischemic-looking wounds on the left fifth toe and left lateral malleolus. 05/10/16; the patient went for her arterial studies and saw Dr. Fletcher Anon. As predicted she is felt to have critical limb ischemia. The feeling is that she has occlusion of the SFA. The feeling would she would be a candidate for a stent to the SFA. The patient did not make a decision to proceed with the procedure and she is here with family members to discuss this me today. He shouldn't has a lot of pain and cannot sleep and rest well at night per her family. 05/24/16; the patient went and had a complex revascularization/angioplasty of the left superficial femoral artery followed by drug-coated balloon angioplasty and spots stenting. She tolerated the procedure well. She was recommended for dual antiplatelet drugs with Plavix and aspirin for at least a month. She went yesterday for I believe follow-up serial Dopplers and ABIs although I don't see these results. The patient is unfortunately complaining of a lot of pain in her bilateral lower legs below the knees from the ankle to the knees. She apparently was prescribed lidocaine and apparently put this on her legs instead of over the wound areas. This may have something to do with it however there is a lot of edema in her bilateral legs I was able to find her arterial studies from yesterday. The left ABI has improved up to 0.66 post left SFA stent. The bilateral great toe indices remain abnormal with the left being in the 0.25 range. Duplex ultrasound showed her left SFA stent is  patent monophasic waveforms persist in the left leg 06/07/16; continued punched-out areas over the dorsal left fifth toe and left lateral malleolus. No major improvement 06/28/16; the areas over her dorsal left fifth toe and left lateral malleolus are covered in surface slough we are using Iodoflex 07/05/16. We have been using Iodoflex for 2-3 weeks now. I have not been debridement is because of pain EUPHA, LOBB ([id number]) 07/19/16 currently we have been using Iodoflex for roughly the past month. Previously we were unable to debride due to pain although today patient states that the pain is not nearly as severe as it has been in the past. In fact she rates this to be a 1 out of 10 and it worse to a to 10 with palpation of the wound. All and all her and her daughter feel like this is actually doing steadily better at this point in time. She is pleased with progress and we have been seeing her every 2 weeks. 08/02/16; this is a delightful 84 year old woman I have not seen in over a month. She has arterial insufficiency wounds remaining over the left lateral malleolus and the left fifth toe. With the help of Dr. Fletcher Anon we are able to get her revascularized on the left. The 2 wounds on the left have been making good progress and are definitely smaller especially the area over the left lateral malleolus. She is certainly in a lot less pain than she used to be although I still  think there is some claudication type pain. Unfortunately she is developed a area on the right lateral foot which is a small open area but I think is threatened. I suspect a small ischemic areas well. Also on her right fifth toe there is an area that is not open but looks as though it is receiving too much pressure from footwear. Finally an area over her right malleolus although I don't think this is on its way to anything ominous 08/17/16 patient presents today for follow up evaluation she tells me that she is really doing  fairly well from a pain standpoint compared to where she has been previous. She is tolerating the dressing changes without any complication. She continues to have discharge and drainage however. 08-30-16 Ms. Nieland presents today with her daughter, she states that she continues to have intermittent shooting pains to the left lateral fifth toe at this site of seems to be a healed ulcer. She denies any other issues that her wound related since her last appointment. Her daughter states that she is in need of a tramadol refill at this time as she uses half a tablet at at bedtime to aid in sleep due to foot pain. Iodoflex has been used on all wounds in previous dressing changes. 09/13/16; the patient has ischemic wounds in her feet. The area over the left lateral malleolus is healed and the area over the left fifth toe looks improved.. She has an area on the lateral aspect of her right foot which is a small but deep wound. Finally she has a new open area on the medial aspect of the left medial malleolus. This is superficial 09/27/16; the area over the left lateral malleolus remains healed. The area over the left fifth toe has a surface and she states the pain is better but I don't think this is completely closed. She has an area on the lateral aspect of her right foot is a small but deep wound. The new open area on the medial aspect of the left ankle was closed from last time. 10/18/16; the area over her left lateral malleolus remains healed. The area over the left fifth toe has a surface over the top of this however there is no overt open area. Given the underlying issues of severe PAD and continued pain in the toe I would think it would be unlikely this is truly healed however I am not planning to debridement this area. The area on the right lateral foot which was a more recent wound is a small punched out painful area again has significant surface slough and nonviable tissue. It is clear the patient  still has claudication type pain however she remains functional. I have been giving her tramadol when necessary and that seems to help a lot with her pain the patient follows up with Dr. Fletcher Anon on January 18/18 11/01/16; patient missed her follow-up with Dr. Fletcher Anon last week due to a snow day. Follow-up is now on February 15. The areas on the right lateral malleolus and dorsal right fifth toe remain closed over. The fifth toe was tentative is there is a surface eschar however I'm not going to disturb this. Therefore, her only open areas on the right lateral foot. This is a small punched out area. We have been using Prisma. I'm quite convinced this is an ischemic wound 11/15/16; patient has follow-up with Dr. Fletcher Anon on February 15. She has no open wound on the left leg and doesn't really complain of claudication that  I can determine from talking to her. However on the right leg she clearly has some degree of claudication. The remaining open wound is on the right lateral foot. We have been using Iodosorb ointment 11/29/16; patient saw Dr. Fletcher Anon on 11/24/16. His comment is that her wound on her right lateral foot seems to be improving with local wound care. She has known significant right SFA disease. Her lower extremity Doppler will be repeated and according to the patient's daughter that appointment is on March 15. He is left with the thought that endovascular intervention in the right SFA might be necessary. The patient has quite a bit of pain especially at night related to the wound in her right foot. We changed her to Iodosorb ointment last week 12/13/16; this is a patient I follow every 2 weeks largely on a palliative approach at this point about ischemic wounds currently in the right foot. Initially she had them on her left lateral malleolus and left fifth toe. The area on the left lateral malleolus healed and the fifth toe has a surface eschar that I have elected not to remove. Both of these improved  after revascularization by Dr. Fletcher Anon. We have been using Iodosorb to the right lateral foot not much change here. 12/27/16; the patient had her arterial studies. This showed known bilateral SFA disease. Stable right ABI 0.5 to stable left ABI at 0.7 to the did not do it TBI on the right it was 0.61 on the left she has a stent in the long segment of her left SFA from 05/18/16. All of her wounds are somewhat better. She states her pain is better in the right foot is improved. 01/10/17- patient is here for follow-up dilation of her right lateral foot ulcer. She has been tolerating Iodosorb. She is voices no complaints or concerns 01/24/17; small ischemic wound on right lateral foot. still non viable cover. follows with Dr. Fletcher Anon on 4/30. No open area on left foot Labarge, Estill Bamberg ([id number]) 02/07/17 doing well and states she is pain free. Saw Dr. Fletcher Anon who said she is doing well and has "50% flow in the right leg and 70% in the left. According to her daughter he does not wish to do any further interventions 02/21/17; small ischemic wound on the right lateral foot. Per her daughter and the patient she has no open wound on the left foot and ankle which were her initial presenting wounds. still has claudication type pain at night she is been to see interventional cardiology who does not feel that she has a need for intervention on the right leg at present. She still has claudication type pain in the right leg which she manages at night with when necessary tramadol that I have prescribed 03/14/17; small ischemic wound on the right lateral foot. They've been dressing this at home with Iodosorb ointment. The patient does not complain of any pain. The wound has a surface eschar over it and there is really no visible open area. This is similar to how the areas on the left leg healed 04/11/17; the small ischemic wound on her right lateral foot is finally closed over. Small amount of eschar over the surface however I did  not attempt to remove this. The patient claims to be asymptomatic she is not having any claudication that I'm able to elicit although her activity is limited Readmission: 10/23/17 on evaluation today patient appears to be doing somewhat poorly in regard to her right foot where she has two ulcers at  this point. The worst is on the lateral aspect of her foot medial first metatarsal region is not nearly as bad. With that being said these did arise seemingly out of nowhere she has previously had a similar issue in the past these have always been ischemic wounds due to arterial insufficiency. She does see Dr. Fletcher Anon as her vascular specialist and it has been noted that her ABI's are somewhat low. She actually has a repeat evaluation coming up this March 2019 to reevaluate her blood flow. On the last check 12/22/16 it was noted that patient had stable ABI's in the lower moderate abnormal range in regard to the right in regard to the left and the upper moderate at normal range. Patient is having some discomfort though nothing severe at this point in time. She is seen with her daughter and son-in-law today. No fevers, chills, nausea, or vomiting noted at this time. 10/30/17 on evaluation today patient appears to be doing well in regard to her wounds. The right lateral foot wound appears to show signs of cleaning up nicely there is granulation noted underneath that the bed of the wound although there still some [facility] covering. Nonetheless she still has a lot of discomfort and I really do not want to proceed with debridement due to the fact that she does have so much discomfort. Good news is the tramadol has been helping her at bedtime she only takes this once a day and that has been extremely beneficial. Unfortunately she does not have a primary right now as she is awaiting a new one which is the reason that I am prescribing the tramadol. Nonetheless I'm pleased with how things seem to be progressing in regard  to her ulcers. 11/06/17 on evaluation today patient appears to be doing decently well as far as maintaining in regard to her ulcers. Unfortunately I did review her arterial study which shows that she has on the right arterial obstruction involving the superficial femoral and popliteal artery as well is the superficial femoral artery with a high grade stenosis versus inclusion. Collateral flow noted in the distal portion of the SFA. Severe progression is noted compared to previous study. Unfortunately being the patient's wounds do not seem to be healing as appropriately and quickly as we would like I do believe this is likely a result of poor vascular flow to the right lower extremity. This was discussed with patient and her daughter during the office visit today. 11/14/17 on evaluation today patient continues to have a fairly stalled ulcer in regard to her to ulcers on the right lower extremity. She obviously did have significant findings on her arterial study and I do believe this is again due to poor vascular flow. She does have an appointment later this afternoon with her vascular specialist for further evaluation to see if there any recommendations at that point. With that being said she continues to have discomfort which I do believe is arterial in nature in regard to insufficiency. 11/21/17 on evaluation today patient appears to be doing fairly well in regard to the appearance of her wounds other than the fact that she does have erythema surrounding the wound bed at this point in fact this encompasses the majority of the dorsal surface of her right foot. There is additional granulation noted today that was not noted previously on evaluation and again if that were alone were the findings that we were seeing I would be very happy with the current progress. However unfortunately she is having  the erythema which concerns me for the possibility of infection although the other possibility is that she  could just be having a local reaction due to the I resort. In the past she never had this issue however when she use this for the root left foot which makes me again come back to my concern about infection. 11/28/17 on evaluation today patient is status post having had an angiogram performed on 11/22/17 where it was noted that she had severe disease involving the TP trunk and proximal peroneal artery. Subsequently she underwent successful atherectomy and drug coated balloon angioplasty to the right SFA, TP trunk, and proximal peroneal artery. Patient stated only of has 10% residual stenosis and overall the procedure at the time was both tolerated well as well as considered a success. This was performed by Dr. Sophronia Simas. Patient was subsequently discharged home on 11/23/17 from the hospital. Overall she states that her leg pain is not nearly as severe as what it was previous which is excellent news. She also has been sleeping with her SHELINA, LUO ([id number]) leg in the bed previously she was hanging this over the edge due to the pain. She also seems to have much better blood flow on evaluation today. Electronic Signature(s) Signed: 11/28/2017 5:27:42 PM By: Worthy Keeler PA-C Entered By: Worthy Keeler on 11/28/2017 10:00:55 [name] ([id number]) -------------------------------------------------------------------------------- Physical Exam Details Patient Name: [name] Date of Service: 11/28/2017 9:15 AM Medical Record Number: [id number] Patient Account Number: [id number] Date of Birth/Sex: 1919/02/16 (84 y.o. Female) Treating RN: Carolyne Fiscal, Debi Primary Care Provider: PATIENT, NO Other Clinician: Referring Provider: Referral, Self Treating Provider/Extender: STONE III, HOYT Weeks in Treatment: 5 Constitutional Well-nourished and well-hydrated in no acute distress. Respiratory normal breathing without difficulty. clear to auscultation bilaterally. Cardiovascular regular  rate and rhythm with normal S1, S2. 1+ dorsalis pedis/posterior tibialis pulses. no clubbing, cyanosis, significant edema, <3 sec cap refill. Psychiatric this patient is able to make decisions and demonstrates good insight into disease process. Alert and Oriented x 3. pleasant and cooperative. Notes Patient's wounds do show evidence of bleeding just with cleaning using saline and gauze. Obviously this is a change an excellent news compared to previous evaluations where I have never really known her to bleed at all. As I explained to patient in wound care bleeding as a good thing because that means she has the ability to heal. No debridement was performed today as she has a significant rash either from the antibiotic or else potentially from the dyeThat was used during the procedure and I did not want to aggravate anything she's actually on steroids right now for this reaction. With that being said she did have less than three second capillary refill in her toes of the right foot which is also change the coloring looks much better other than the rash at this point and again that will resolve. Overall I'm pleased with the appearance of her wounds at this point. Electronic Signature(s) Signed: 11/28/2017 5:27:42 PM By: Worthy Keeler PA-C Entered By: Worthy Keeler on 11/28/2017 10:03:02 [name] ([id number]) -------------------------------------------------------------------------------- Physician Orders Details Patient Name: [name] Date of Service: 11/28/2017 9:15 AM Medical Record Number: [id number] Patient Account Number: [id number] Date of Birth/Sex: 09/14/1919 (84 y.o. Female) Treating RN: Carolyne Fiscal, Debi Primary Care Provider: PATIENT, NO Other Clinician: Referring Provider: Referral, Self Treating Provider/Extender: STONE III, HOYT Weeks in Treatment: 5 Verbal / Phone Orders: Yes Clinician: Carolyne Fiscal, Debi Read Back and Verified: Yes Diagnosis Coding Wound  Cleansing Wound #  6 Right,Medial Toe Great o Clean wound with Normal Saline. o Cleanse wound with mild soap and water o May Shower, gently pat wound dry prior to applying new dressing. Wound #7 Right,Dorsal Foot o Clean wound with Normal Saline. o Cleanse wound with mild soap and water o May Shower, gently pat wound dry prior to applying new dressing. Anesthetic (add to Medication List) Wound #6 Right,Medial Toe Great o Topical Lidocaine 4% cream applied to wound bed prior to debridement (In Clinic Only). Wound #7 Right,Dorsal Foot o Topical Lidocaine 4% cream applied to wound bed prior to debridement (In Clinic Only). Skin Barriers/Peri-Wound Care Wound #7 Right,Dorsal Foot o Skin Prep Primary Wound Dressing Wound #6 Right,Medial Toe Great o Santyl Ointment Wound #7 Right,Dorsal Foot o Santyl Ointment Secondary Dressing Wound #6 Right,Medial Toe Great o Other - coverlet Wound #7 Right,Dorsal Foot o Dry Gauze o Tegaderm Dressing Change Frequency Wound #6 Right,Medial Toe Great o Change dressing every day. Wound #7 Right,Dorsal Foot o Change dressing every day. CARLYN, LEMKE ([id number]) Follow-up Appointments Wound #6 Right,Medial Toe Great o Return Appointment in 1 week. Wound #7 Right,Dorsal Foot o Return Appointment in 1 week. Edema Control Wound #6 Right,Medial Toe Great o Elevate legs to the level of the heart and pump ankles as often as possible Wound #7 Right,Dorsal Foot o Elevate legs to the level of the heart and pump ankles as often as possible Off-Loading Wound #6 Right,Medial Toe Great o Turn and reposition every 2 hours Wound #7 Right,Dorsal Foot o Turn and reposition every 2 hours Additional Orders / Instructions Wound #6 Right,Medial Toe Great o Increase protein intake. o Other: - Vitamin C, Zinc Wound #7 Right,Dorsal Foot o Increase protein intake. o Other: - Vitamin C, Zinc Medications-please  add to medication list. Wound #7 Right,Dorsal Foot o P.O. Antibiotics - start antibiotics as prescribed o Santyl Enzymatic Ointment Patient Medications Allergies: codeine Notifications Medication Indication Start End lidocaine DOSE 1 - topical 4 % cream - 1 cream topical Electronic Signature(s) Signed: 11/28/2017 4:06:48 PM By: Alric Quan Signed: 11/28/2017 5:27:42 PM By: Worthy Keeler PA-C Entered By: Alric Quan on 11/28/2017 09:51:23 [name] ([id number]) -------------------------------------------------------------------------------- Prescription 11/28/2017 Patient Name: [name] Provider: Worthy Keeler PA-C Date of Birth: July 13, 1919 NPI#: [id number] Sex: F DEA#: [id number] Phone #: [phone] License #: Patient Address: [facility] [place], [place] [address] [address], [place], [place] [address] [phone] Allergies codeine Reaction: sick on stomach Severity: Severe Medication Medication: Route: Strength: Form: lidocaine 4 % topical cream topical 4% cream Class: TOPICAL LOCAL ANESTHETICS Dose: Frequency / Time: Indication: 1 1 cream topical Number of Refills: Number of Units: 0 Generic Substitution: Start Date: End Date: One Time Use: Substitution Permitted No Note to Pharmacy: Signature(s): Date(s): Electronic Signature(s) Signed: 11/28/2017 4:06:48 PM By: Alric Quan Signed: 11/28/2017 5:27:42 PM By: Worthy Keeler PA-C Entered By: Alric Quan on 11/28/2017 09:51:24 [name] ([id number]) [name] ([id number]) --------------------------------------------------------------------------------  Problem List Details Patient Name: [name] Date of Service: 11/28/2017 9:15 AM Medical Record Number: [id number] Patient Account Number: [id number] Date of Birth/Sex: 12-Sep-1919 (84 y.o. 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 other 10/23/2017 Yes part of foot I70.232 Atherosclerosis of native  arteries of right leg with ulceration of calf 10/23/2017 Yes L97.512 Non-pressure chronic ulcer of other part of right foot with fat layer 10/23/2017 Yes exposed [place] (primary) hypertension 10/24/2017 Yes Inactive Problems Resolved Problems Electronic Signature(s) Signed: 11/28/2017 5:27:42 PM By: Worthy Keeler PA-C Entered By: Worthy Keeler on 11/28/2017 09:54:37 [name] ([id number]) -------------------------------------------------------------------------------- Progress Note Details Patient Name: [name] Date of Service: 11/28/2017 9:15 AM Medical Record Number: [id number] Patient Account Number: [id number] Date of Birth/Sex: Aug 06, 1919 (84 y.o. Female) Treating RN: Carolyne Fiscal, Debi Primary Care Provider: PATIENT, NO Other Clinician: Referring Provider: Referral, Self Treating Provider/Extender: STONE III, HOYT Weeks in Treatment: 5 Subjective Chief Complaint Information obtained from Patient Right foot Ulcers History of Present Illness (HPI) 04/06/16; this is a 84 year old woman who arrives accompanied by 2 daughters for a wound on her left ankle and her left fifth toe. These have apparently been present for a year. I'm not quite certain how she came to this clinic however she was being followed by Sharlotte Alamo her podiatrist for these wounds. She was also referred to [facility] and they apparently did a test presumably arterial studies although we don't have any of these results and we couldn't get through to the office today. The family but has been  applying a combination of Bactroban and a light bandage and perhaps more recently Silvadene cream. She did have an x-ray of the foot roughly 6 months ago at the podiatry office the family was unaware that if there were any abnormalities. Apparently they have not seen any healing here. Our intake nurse noted a slight skin tear on the right anterior lower leg. Nobody seemed aware of this. ABIs calculated in this clinic was 0.3 on the right and 0.4 on the left I have reviewed things in [facility]. There is very little information on this patient. She apparently follows in current [facility] clinic which we don't have information from. She has mentioned already been to a [facility]. She has a history of hypothyroidism, nephrolithiasis arthritis and has had a previous mastectomy. 04/13/16; patient's x-ray was normal. She is already been to see Dr. Delana Meyer vascular surgery. Her arterial exam was from November 2016 this showed a left ABI of 0.62 her right of 0.76. Her duplex ultrasound of the left leg showed biphasic waves in the common femoral and distal femoral artery however monophasic waves in the superficial femoral artery proximal and mid biphasic it distal. 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 The patient tells me that she has pain at night when she tries to lie down which is improved by getting up and sitting in the chair this sounds like claudication at rest. Her wounds are on the left medial malleolus and the dorsal fifth toe small punched out wounds that are right on bone. We use Santyl last week 04/20/16: nurse informed me pt has declined evaluation for significant PAD. she denies systemic s/s of infections. 04/27/16;; the patient has had noninvasive arterial studies done in November 2016. ABI and the left was 0.62. Monophasic waves at the superficial femoral artery. Occluded to the posterior tibial artery. So had greater than 50% stenosis of the right superficial femoral and greater than 50%  stenosis of the left superficial femoral artery. She had bilateral tibial peroneal artery disease. Both of the wounds on the left fifth toe and left lateral malleolus have been present for more than a year. They have been to see vein and vascular. The patient has some pain but miraculously  I think the wounds have largely been stable. No evidence of infection 05/04/16; she goes for a noninvasive study tomorrow and then sees Dr. Fletcher Anon on Monday. By the time she is here next week we should have a better picture of whether something can be done with regards to her arterial flow. We continue to have ischemic-looking wounds on the left fifth toe and left lateral malleolus. 05/10/16; the patient went for her arterial studies and saw Dr. Fletcher Anon. As predicted she is felt to have critical limb ischemia. The feeling is that she has occlusion of the SFA. The feeling would she would be a candidate for a stent to the SFA. The patient did not make a decision to proceed with the procedure and she is here with family members to discuss this me today. He shouldn't has a lot of pain and cannot sleep and rest well at night per her family. 05/24/16; the patient went and had a complex revascularization/angioplasty of the left superficial femoral artery followed by drug-coated balloon angioplasty and spots stenting. She tolerated the procedure well. She was recommended for dual antiplatelet drugs with Plavix and aspirin for at least a month. She went yesterday for I believe follow-up serial Dopplers and ABIs although I don't see these results. The patient is unfortunately complaining of a lot of pain in her bilateral lower legs below the knees from the ankle to the knees. She apparently was prescribed lidocaine and apparently put this on her legs instead of over the wound areas. This may have something to do with it however there is a lot of edema in her bilateral legs I was able to find her arterial studies from yesterday.  The left ABI has improved up to 0.66 post left SFA stent. The bilateral Lipsey, Julanne ([id number]) great toe indices remain abnormal with the left being in the 0.25 range. Duplex ultrasound showed her left SFA stent is patent monophasic waveforms persist in the left leg 06/07/16; continued punched-out areas over the dorsal left fifth toe and left lateral malleolus. No major improvement 06/28/16; the areas over her dorsal left fifth toe and left lateral malleolus are covered in surface slough we are using Iodoflex 07/05/16. We have been using Iodoflex for 2-3 weeks now. I have not been debridement is because of pain 07/19/16 currently we have been using Iodoflex for roughly the past month. Previously we were unable to debride due to pain although today patient states that the pain is not nearly as severe as it has been in the past. In fact she rates this to be a 1 out of 10 and it worse to a to 10 with palpation of the wound. All and all her and her daughter feel like this is actually doing steadily better at this point in time. She is pleased with progress and we have been seeing her every 2 weeks. 08/02/16; this is a delightful 84 year old woman I have not seen in over a month. She has arterial insufficiency wounds remaining over the left lateral malleolus and the left fifth toe. With the help of Dr. Fletcher Anon we are able to get her revascularized on the left. The 2 wounds on the left have been making good progress and are definitely smaller especially the area over the left lateral malleolus. She is certainly in a lot less pain than she used to be although I still think there is some claudication type pain. Unfortunately she is developed a area on the right lateral foot which is a small open  area but I think is threatened. I suspect a small ischemic areas well. Also on her right fifth toe there is an area that is not open but looks as though it is receiving too much pressure from footwear. Finally an  area over her right malleolus although I don't think this is on its way to anything ominous 08/17/16 patient presents today for follow up evaluation she tells me that she is really doing fairly well from a pain standpoint compared to where she has been previous. She is tolerating the dressing changes without any complication. She continues to have discharge and drainage however. 08-30-16 Ms. Cross presents today with her daughter, she states that she continues to have intermittent shooting pains to the left lateral fifth toe at this site of seems to be a healed ulcer. She denies any other issues that her wound related since her last appointment. Her daughter states that she is in need of a tramadol refill at this time as she uses half a tablet at at bedtime to aid in sleep due to foot pain. Iodoflex has been used on all wounds in previous dressing changes. 09/13/16; the patient has ischemic wounds in her feet. The area over the left lateral malleolus is healed and the area over the left fifth toe looks improved.. She has an area on the lateral aspect of her right foot which is a small but deep wound. Finally she has a new open area on the medial aspect of the left medial malleolus. This is superficial 09/27/16; the area over the left lateral malleolus remains healed. The area over the left fifth toe has a surface and she states the pain is better but I don't think this is completely closed. She has an area on the lateral aspect of her right foot is a small but deep wound. The new open area on the medial aspect of the left ankle was closed from last time. 10/18/16; the area over her left lateral malleolus remains healed. The area over the left fifth toe has a surface over the top of this however there is no overt open area. Given the underlying issues of severe PAD and continued pain in the toe I would think it would be unlikely this is truly healed however I am not planning to debridement this area.  The area on the right lateral foot which was a more recent wound is a small punched out painful area again has significant surface slough and nonviable tissue. It is clear the patient still has claudication type pain however she remains functional. I have been giving her tramadol when necessary and that seems to help a lot with her pain the patient follows up with Dr. Fletcher Anon on January 18/18 11/01/16; patient missed her follow-up with Dr. Fletcher Anon last week due to a snow day. Follow-up is now on February 15. The areas on the right lateral malleolus and dorsal right fifth toe remain closed over. The fifth toe was tentative is there is a surface eschar however I'm not going to disturb this. Therefore, her only open areas on the right lateral foot. This is a small punched out area. We have been using Prisma. I'm quite convinced this is an ischemic wound 11/15/16; patient has follow-up with Dr. Fletcher Anon on February 15. She has no open wound on the left leg and doesn't really complain of claudication that I can determine from talking to her. However on the right leg she clearly has some degree of claudication. The remaining open wound  is on the right lateral foot. We have been using Iodosorb ointment 11/29/16; patient saw Dr. Fletcher Anon on 11/24/16. His comment is that her wound on her right lateral foot seems to be improving with local wound care. She has known significant right SFA disease. Her lower extremity Doppler will be repeated and according to the patient's daughter that appointment is on March 15. He is left with the thought that endovascular intervention in the right SFA might be necessary. The patient has quite a bit of pain especially at night related to the wound in her right foot. We changed her to Iodosorb ointment last week 12/13/16; this is a patient I follow every 2 weeks largely on a palliative approach at this point about ischemic wounds currently in the right foot. Initially she had them on her left  lateral malleolus and left fifth toe. The area on the left lateral malleolus healed and the fifth toe has a surface eschar that I have elected not to remove. Both of these improved after revascularization by Dr. Fletcher Anon. We have been using Iodosorb to the right lateral foot not much change here. 12/27/16; the patient had her arterial studies. This showed known bilateral SFA disease. Stable right ABI 0.5 to stable left ABI at 0.7 to the did not do it TBI on the right it was 0.61 on the left she has a stent in the long segment of her left SFA from [place], Estill Bamberg ([id number]) 05/18/16. All of her wounds are somewhat better. She states her pain is better in the right foot is improved. 01/10/17- patient is here for follow-up dilation of her right lateral foot ulcer. She has been tolerating Iodosorb. She is voices no complaints or concerns 01/24/17; small ischemic wound on right lateral foot. still non viable cover. follows with Dr. Fletcher Anon on 4/30. No open area on left foot 02/07/17 doing well and states she is pain free. Saw Dr. Fletcher Anon who said she is doing well and has "50% flow in the right leg and 70% in the left. According to her daughter he does not wish to do any further interventions 02/21/17; small ischemic wound on the right lateral foot. Per her daughter and the patient she has no open wound on the left foot and ankle which were her initial presenting wounds. still has claudication type pain at night she is been to see interventional cardiology who does not feel that she has a need for intervention on the right leg at present. She still has claudication type pain in the right leg which she manages at night with when necessary tramadol that I have prescribed 03/14/17; small ischemic wound on the right lateral foot. They've been dressing this at home with Iodosorb ointment. The patient does not complain of any pain. The wound has a surface eschar over it and there is really no visible open area. This is  similar to how the areas on the left leg healed 04/11/17; the small ischemic wound on her right lateral foot is finally closed over. Small amount of eschar over the surface however I did not attempt to remove this. The patient claims to be asymptomatic she is not having any claudication that I'm able to elicit although her activity is limited Readmission: 10/23/17 on evaluation today patient appears to be doing somewhat poorly in regard to her right foot where she has two ulcers at this point. The worst is on the lateral aspect of her foot medial first metatarsal region is not nearly as bad. With that  being said these did arise seemingly out of nowhere she has previously had a similar issue in the past these have always been ischemic wounds due to arterial insufficiency. She does see Dr. Fletcher Anon as her vascular specialist and it has been noted that her ABI's are somewhat low. She actually has a repeat evaluation coming up this March 2019 to reevaluate her blood flow. On the last check 12/22/16 it was noted that patient had stable ABI's in the lower moderate abnormal range in regard to the right in regard to the left and the upper moderate at normal range. Patient is having some discomfort though nothing severe at this point in time. She is seen with her daughter and son-in-law today. No fevers, chills, nausea, or vomiting noted at this time. 10/30/17 on evaluation today patient appears to be doing well in regard to her wounds. The right lateral foot wound appears to show signs of cleaning up nicely there is granulation noted underneath that the bed of the wound although there still some [facility] covering. Nonetheless she still has a lot of discomfort and I really do not want to proceed with debridement due to the fact that she does have so much discomfort. Good news is the tramadol has been helping her at bedtime she only takes this once a day and that has been extremely beneficial. Unfortunately she does  not have a primary right now as she is awaiting a new one which is the reason that I am prescribing the tramadol. Nonetheless I'm pleased with how things seem to be progressing in regard to her ulcers. 11/06/17 on evaluation today patient appears to be doing decently well as far as maintaining in regard to her ulcers. Unfortunately I did review her arterial study which shows that she has on the right arterial obstruction involving the superficial femoral and popliteal artery as well is the superficial femoral artery with a high grade stenosis versus inclusion. Collateral flow noted in the distal portion of the SFA. Severe progression is noted compared to previous study. Unfortunately being the patient's wounds do not seem to be healing as appropriately and quickly as we would like I do believe this is likely a result of poor vascular flow to the right lower extremity. This was discussed with patient and her daughter during the office visit today. 11/14/17 on evaluation today patient continues to have a fairly stalled ulcer in regard to her to ulcers on the right lower extremity. She obviously did have significant findings on her arterial study and I do believe this is again due to poor vascular flow. She does have an appointment later this afternoon with her vascular specialist for further evaluation to see if there any recommendations at that point. With that being said she continues to have discomfort which I do believe is arterial in nature in regard to insufficiency. 11/21/17 on evaluation today patient appears to be doing fairly well in regard to the appearance of her wounds other than the fact that she does have erythema surrounding the wound bed at this point in fact this encompasses the majority of the dorsal surface of her right foot. There is additional granulation noted today that was not noted previously on evaluation and again if that were alone were the findings that we were seeing I  would be very happy with the current progress. However unfortunately she is having the erythema which concerns me for the possibility of infection although the other possibility is that she could just be having a  local reaction due to the I resort. In the past she never had this issue however when she use this for the root left foot which makes me again come back to my concern about infection. JOCELYNN, GIOFFRE ([id number]) 11/28/17 on evaluation today patient is status post having had an angiogram performed on 11/22/17 where it was noted that she had severe disease involving the TP trunk and proximal peroneal artery. Subsequently she underwent successful atherectomy and drug coated balloon angioplasty to the right SFA, TP trunk, and proximal peroneal artery. Patient stated only of has 10% residual stenosis and overall the procedure at the time was both tolerated well as well as considered a success. This was performed by Dr. Sophronia Simas. Patient was subsequently discharged home on 11/23/17 from the hospital. Overall she states that her leg pain is not nearly as severe as what it was previous which is excellent news. She also has been sleeping with her leg in the bed previously she was hanging this over the edge due to the pain. She also seems to have much better blood flow on evaluation today. Patient History Information obtained from Patient. Family History Diabetes - Mother, Heart Disease - Siblings, Stroke - Siblings, No family history of Cancer, Hereditary Spherocytosis, Hypertension, Kidney Disease, Lung Disease, Seizures, Thyroid Problems, Tuberculosis. Social History Never smoker, Marital Status - Widowed, Alcohol Use - Never, Drug Use - No History, Caffeine Use - Daily. Review of Systems (ROS) Constitutional Symptoms (General Health) Denies complaints or symptoms of Fever, Chills. Respiratory The patient has no complaints or symptoms. Cardiovascular The patient has no complaints or  symptoms. Psychiatric The patient has no complaints or symptoms. Objective Constitutional Well-nourished and well-hydrated in no acute distress. Vitals Time Taken: 9:32 AM, Height: 64 in, Weight: 156 lbs, BMI: 26.8, Temperature: 97.9 F, Pulse: 77 bpm, Respiratory Rate: 16 breaths/min, Blood Pressure: 139/59 mmHg. Respiratory normal breathing without difficulty. clear to auscultation bilaterally. Cardiovascular regular rate and rhythm with normal S1, S2. 1+ dorsalis pedis/posterior tibialis pulses. no clubbing, cyanosis, significant edema, Psychiatric this patient is able to make decisions and demonstrates good insight into disease process. Alert and Oriented x 3. pleasant and cooperative. PIERCE, BIAGINI ([id number]) General Notes: Patient's wounds do show evidence of bleeding just with cleaning using saline and gauze. Obviously this is a change an excellent news compared to previous evaluations where I have never really known her to bleed at all. As I explained to patient in wound care bleeding as a good thing because that means she has the ability to heal. No debridement was performed today as she has a significant rash either from the antibiotic or else potentially from the dyeThat was used during the procedure and I did not want to aggravate anything she's actually on steroids right now for this reaction. With that being said she did have less than three second capillary refill in her toes of the right foot which is also change the coloring looks much better other than the rash at this point and again that will resolve. Overall I'm pleased with the appearance of her wounds at this point. Integumentary (Hair, Skin) Wound #6 status is Open. Original cause of wound was Gradually Appeared. The wound is located on the [employer]. The wound measures 0.5cm length x 0.3cm width x 0.1cm depth; 0.118cm^2 area and 0.012cm^3 volume. There is no tunneling or undermining noted. There  is a large amount of serous drainage noted. The wound margin is distinct with the outline attached to the wound base.  There is large (67-100%) red granulation within the wound bed. There is a small (1-33%) amount of necrotic tissue within the wound bed including Eschar. Periwound temperature was noted as No Abnormality. The periwound has tenderness on palpation. Wound #7 status is Open. Original cause of wound was Gradually Appeared. The wound is located on the Right,Dorsal Foot. The wound measures 1.2cm length x 1.4cm width x 0.2cm depth; 1.319cm^2 area and 0.264cm^3 volume. There is Fat Layer (Subcutaneous Tissue) Exposed exposed. There is no tunneling or undermining noted. There is a large amount of purulent drainage noted. The wound margin is distinct with the outline attached to the wound base. There is small (1-33%) pink granulation within the wound bed. There is a large (67-100%) amount of necrotic tissue within the wound bed including Adherent Slough. The periwound skin appearance exhibited: Induration, Maceration, Erythema. The surrounding wound skin color is noted with erythema which is circumferential. Periwound temperature was noted as No Abnormality. The periwound has tenderness on palpation. 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 Cleanse wound with mild soap and water May Shower, gently pat wound dry prior to applying new dressing. Wound #7 Right,Dorsal Foot: Clean wound with Normal Saline. Cleanse wound with mild soap and water May Shower, gently pat wound dry prior to applying new  dressing. Anesthetic (add to Medication List): Wound #6 Right,Medial Toe Great: Topical Lidocaine 4% cream applied to wound bed prior to debridement (In Clinic Only). JALEEA, ALESI ([id number]) Wound #7 Right,Dorsal Foot: Topical Lidocaine 4% cream applied to wound bed prior to debridement (In Clinic Only). Skin Barriers/Peri-Wound Care: Wound #7 Right,Dorsal Foot: Skin Prep Primary Wound Dressing: Wound #6 Right,Medial Toe Great: Santyl Ointment Wound #7 Right,Dorsal Foot: Santyl Ointment Secondary Dressing: Wound #6 Right,Medial Toe Great: Other - coverlet Wound #7 Right,Dorsal Foot: Dry Gauze Tegaderm Dressing Change Frequency: Wound #6 Right,Medial Toe Great: Change dressing every day. Wound #7 Right,Dorsal Foot: Change dressing every day. Follow-up Appointments: Wound #6 Right,Medial Toe Great: Return Appointment in 1 week. Wound #7 Right,Dorsal Foot: Return Appointment in 1 week. Edema Control: Wound #6 Right,Medial Toe Great: Elevate legs to the level of the heart and pump ankles as often as possible Wound #7 Right,Dorsal Foot: Elevate legs to the level of the heart and pump ankles as often as possible Off-Loading: Wound #6 Right,Medial Toe Great: Turn and reposition every 2 hours Wound #7 Right,Dorsal Foot: Turn and reposition every 2 hours Additional Orders / Instructions: Wound #6 Right,Medial Toe Great: Increase protein intake. Other: - Vitamin C, Zinc Wound #7 Right,Dorsal Foot: Increase protein intake. Other: - Vitamin C, Zinc Medications-please add to medication list.: Wound #7 Right,Dorsal Foot: P.O. Antibiotics - start antibiotics as prescribed Santyl Enzymatic Ointment The following medication(s) was prescribed: lidocaine topical 4 % cream 1 1 cream topical was prescribed at facility I am going to give her one week in order to let things settle as far as the procedure is concerned and then we will subsequently see were things stand in one weeks  time. At that point I'm hopeful to be able to go ahead forward with debridement. If everything appears to be doing well in her blood flow remains  to show signs of being appropriate and I think Fleener, Ayianna ([id number]) debridement would be appropriate at that point. In the meantime we will continue with the Santyl. The good news is her culture came back negative for the organisms and there was no need for further antibiotics at this point. Please see above for specific wound care orders. We will see patient for re-evaluation in 1 week(s) here in the clinic. If anything worsens or changes patient will contact our office for additional recommendations. Electronic Signature(s) Signed: 11/28/2017 5:27:42 PM By: Worthy Keeler PA-C Entered By: Worthy Keeler on 11/28/2017 10:04:37 [name] ([id number]) -------------------------------------------------------------------------------- ROS/PFSH Details Patient Name: [name] Date of Service: 11/28/2017 9:15 AM Medical Record Number: [id number] Patient Account Number: [id number] Date of Birth/Sex: August 05, 1919 (84 y.o. Female) Treating RN: Carolyne Fiscal, Debi Primary Care Provider: PATIENT, NO Other Clinician: Referring Provider: Referral, Self Treating Provider/Extender: STONE III, HOYT Weeks in Treatment: 5 Information Obtained From Patient Wound History Do you currently have one or more open woundso Yes How many open wounds do you currently haveo 1 Approximately how long have you had your woundso 1.5 weeks How have you been treating your wound(s) until nowo bandaide Has your wound(s) ever healed and then re-openedo No Have you had any lab work done in the past montho No Have you tested positive for an antibiotic resistant organism (MRSA, VRE)o No Have you tested positive for osteomyelitis (bone infection)o No Have you had any tests for circulation on your legso Yes [facility] the test [facility] Have you had other problems  associated with your woundso Swelling Constitutional Symptoms (General Health) Complaints and Symptoms: Negative for: Fever; Chills Eyes Medical History: Positive for: Cataracts - had surgery Respiratory Complaints and Symptoms: No Complaints or Symptoms Cardiovascular Complaints and Symptoms: No Complaints or Symptoms Medical History: Positive for: Hypertension Musculoskeletal Medical History: Positive for: Osteoarthritis Oncologic Medical History: Negative for: Received Chemotherapy; Received Radiation Psychiatric MARIPOSA, SHORES ([id number]) Complaints and Symptoms: No Complaints or Symptoms HBO Extended History Items Eyes: Cataracts Immunizations Pneumococcal Vaccine: Received Pneumococcal Vaccination: Yes Implantable Devices Family and Social History Cancer: No; Diabetes: Yes - Mother; Heart Disease: Yes - Siblings; Hereditary Spherocytosis: No; Hypertension: No; Kidney Disease: No; Lung Disease: No; Seizures: No; Stroke: Yes - Siblings; Thyroid Problems: No; Tuberculosis: No; Never smoker; Marital Status - Widowed; Alcohol Use: Never; Drug Use: No History; Caffeine Use: Daily; Financial Concerns: No; Food, Clothing or Shelter Needs: No; Support System Lacking: No; Transportation Concerns: No; Advanced Directives: No; Patient does not want information on Advanced Directives; Do not resuscitate: No; Living Will: No; Medical Power of Attorney: No Physician Affirmation I have reviewed and agree with the above information. Electronic Signature(s) Signed: 11/28/2017 4:06:48 PM By: Alric Quan Signed: 11/28/2017 5:27:42 PM By: Worthy Keeler PA-C Entered By: Worthy Keeler on 11/28/2017 10:01:24 [name] ([id number]) -------------------------------------------------------------------------------- SuperBill Details Patient Name: [name] Date of Service: 11/28/2017 Medical Record Number: [id number] Patient Account Number: [id number] Date of  Birth/Sex: [birth year] (84 y.o. 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 chronic ulcer of other part of right foot with fat layer exposed I10 Essential (primary) hypertension Facility Procedures CPT4 Code: 77412878 Description: 67672 - WOUND CARE  VISIT-LEV 3 EST PT Modifier: Quantity: 1 Physician Procedures CPT4 Code Description: [id number] [id number] - WC PHYS LEVEL 3 - EST PT ICD-10 Diagnosis Description I70.235 Atherosclerosis of native arteries of right leg with ulceration o I70.232 Atherosclerosis of native arteries of right leg with ulceration o L97.512  Non-pressure chronic ulcer of other part of right foot with fat l I10 Essential (primary) hypertension Modifier: f other part of fo f calf ayer exposed Quantity: 1 ot Electronic Signature(s) Signed: 11/28/2017 11:07:21 AM By: Alric Quan Signed: 11/28/2017 5:27:42 PM By: Worthy Keeler PA-C Previous Signature: 11/28/2017 10:55:16 AM Version By: Alric Quan Entered By: Alric Quan on 11/28/2017 11:07:21

## 2017-11-30 NOTE — Progress Notes (Signed)
LEROY, TRIM ([id number]) Visit Report for 11/28/2017 Arrival Information Details Patient Name: [name], [name] Date of Service: 11/28/2017 9:15 AM Medical Record Number: [id number] Patient Account Number: [id number] Date of Birth/Sex: [birth year] (84 y.o. Female) Treating RN: Montey Hora Primary Care Tupac Jeffus: PATIENT, NO Other Clinician: Referring Jenalyn Girdner: Referral, Self Treating Raynell Upton/Extender: STONE III, HOYT Weeks in Treatment: 5 Visit Information History Since Last Visit Added or deleted any medications: No Patient Arrived: Walker Any new allergies or adverse reactions: No Arrival Time: 09:26 Had a fall or experienced change in No Accompanied By: dtr activities of daily living that may affect Transfer Assistance: None risk of falls: Patient Identification Verified: Yes Signs or symptoms of abuse/neglect since last visito No Secondary Verification Process Completed: Yes Hospitalized since last visit: No Patient Requires Transmission-Based Precautions: No Has Dressing in Place as Prescribed: Yes Patient Has Alerts: No Pain Present Now: No Electronic Signature(s) Signed: 11/28/2017 4:30:06 PM By: Montey Hora Entered By: Montey Hora on 11/28/2017 09:31:17 [name] ([id number]) -------------------------------------------------------------------------------- Clinic Level of Care Assessment Details Patient Name: [name] Date of Service: 11/28/2017 9:15 AM Medical Record Number: [id number] Patient Account Number: [id number] Date of Birth/Sex: 1919/01/23 (84 y.o. Female) Treating RN: Carolyne Fiscal, Debi Primary Care Quinton Voth: PATIENT, NO Other Clinician: Referring Haeven Nickle: Referral, Self Treating Sears Oran/Extender: STONE III, HOYT Weeks in Treatment: 5 Clinic Level of Care Assessment Items TOOL 4 Quantity Score X - Use when only an EandM is performed on FOLLOW-UP visit 1 0 ASSESSMENTS - Nursing Assessment / Reassessment X - Reassessment of  Co-morbidities (includes updates in patient status) 1 10 X- 1 5 Reassessment of Adherence to Treatment Plan ASSESSMENTS - Wound and Skin Assessment / Reassessment []  - Simple Wound Assessment / Reassessment - one wound 0 X- 2 5 Complex Wound Assessment / Reassessment - multiple wounds []  - 0 Dermatologic / Skin Assessment (not related to wound area) ASSESSMENTS - Focused Assessment []  - Circumferential Edema Measurements - multi extremities 0 []  - 0 Nutritional Assessment / Counseling / Intervention []  - 0 Lower Extremity Assessment (monofilament, tuning fork, pulses) []  - 0 Peripheral Arterial Disease Assessment (using hand held doppler) ASSESSMENTS - Ostomy and/or Continence Assessment and Care []  - Incontinence Assessment and Management 0 []  - 0 Ostomy Care Assessment and Management (repouching, etc.) PROCESS - Coordination of Care X - Simple Patient / Family Education for ongoing care 1 15 []  - 0 Complex (extensive) Patient / Family Education for ongoing care []  - 0 Staff obtains Programmer, systems, Records, Test Results / Process Orders []  - 0 Staff telephones HHA, Nursing Homes / Clarify orders / etc []  - 0 Routine Transfer to another Facility (non-emergent condition) []  - 0 Routine Hospital Admission (non-emergent condition) []  - 0 New Admissions / Biomedical engineer / Ordering NPWT, Apligraf, etc. []  - 0 Emergency Hospital Admission (emergent condition) X- 1 10 Simple Discharge Coordination Barris, Naysha ([id number]) []  - 0 Complex (extensive) Discharge Coordination PROCESS - Special Needs []  - Pediatric / Minor Patient Management 0 []  - 0 Isolation Patient Management []  - 0 Hearing / Language / Visual special needs []  - 0 Assessment of Community assistance (transportation, D/C planning, etc.) []  - 0 Additional assistance / Altered mentation []  - 0 Support Surface(s) Assessment (bed, cushion, seat, etc.) INTERVENTIONS - Wound Cleansing / Measurement []  -  Simple Wound Cleansing - one wound 0 X- 2 5 Complex Wound Cleansing - multiple wounds X- 1 5 Wound Imaging (photographs - any number of wounds) []  - 0 Wound Tracing (instead of photographs) []  -  0 Simple Wound Measurement - one wound X- 2 5 Complex Wound Measurement - multiple wounds INTERVENTIONS - Wound Dressings X - Small Wound Dressing one or multiple wounds 2 10 []  - 0 Medium Wound Dressing one or multiple wounds []  - 0 Large Wound Dressing one or multiple wounds X- 1 5 Application of Medications - topical []  - 0 Application of Medications - injection INTERVENTIONS - Miscellaneous []  - External ear exam 0 []  - 0 Specimen Collection (cultures, biopsies, blood, body fluids, etc.) []  - 0 Specimen(s) / Culture(s) sent or taken to Lab for analysis []  - 0 Patient Transfer (multiple staff / Civil Service fast streamer / Similar devices) []  - 0 Simple Staple / Suture removal (25 or less) []  - 0 Complex Staple / Suture removal (26 or more) []  - 0 Hypo / Hyperglycemic Management (close monitor of Blood Glucose) []  - 0 Ankle / Brachial Index (ABI) - do not check if billed separately X- 1 5 Vital Signs Gerber, Roxana ([id number]) Has the patient been seen at the hospital within the last three years: Yes Total Score: 105 Level Of Care: New/Established - Level 3 Electronic Signature(s) Signed: 11/28/2017 4:06:48 PM By: Alric Quan Entered By: Alric Quan on 11/28/2017 10:55:08 [name] ([id number]) -------------------------------------------------------------------------------- Encounter Discharge Information Details Patient Name: [name] Date of Service: 11/28/2017 9:15 AM Medical Record Number: [id number] Patient Account Number: [id number] Date of Birth/Sex: 02-25-1919 (84 y.o. Female) Treating RN: Carolyne Fiscal, Debi Primary Care Nithila Sumners: PATIENT, NO Other Clinician: Referring Corydon Schweiss: Referral, Self Treating Regina Ganci/Extender: STONE III, HOYT Weeks in  Treatment: 5 Encounter Discharge Information Items Discharge Pain Level: 0 Discharge Condition: Stable Ambulatory Status: Walker Discharge Destination: Home Transportation: Private Auto Accompanied By: home Schedule Follow-up Appointment: Yes Medication Reconciliation completed and No provided to Patient/Care Nijel Flink: Provided on Clinical Summary of Care: 11/28/2017 Form Type Recipient Paper Patient [facility] Electronic Signature(s) Signed: 11/29/2017 8:42:29 AM By: Ruthine Dose Entered By: Ruthine Dose on 11/28/2017 10:00:03 [name] ([id number]) -------------------------------------------------------------------------------- Lower Extremity Assessment Details Patient Name: [name] Date of Service: 11/28/2017 9:15 AM Medical Record Number: [id number] Patient Account Number: [id number] Date of Birth/Sex: 02/26/1919 (84 y.o. Female) Treating RN: Montey Hora Primary Care Saraia Platner: PATIENT, NO Other Clinician: Referring Eriq Hufford: Referral, Self Treating Ahria Slappey/Extender: STONE III, HOYT Weeks in Treatment: 5 Vascular Assessment Pulses: Dorsalis Pedis Palpable: [Right:Yes] Posterior Tibial Extremity colors, hair growth, and conditions: Extremity Color: [Right:Red] Hair Growth on Extremity: [Right:No] Temperature of Extremity: [Right:Warm] Capillary Refill: [Right:< 3 seconds] Notes toes are red and have a bluish tint Electronic Signature(s) Signed: 11/28/2017 4:30:06 PM By: Montey Hora Entered By: Montey Hora on 11/28/2017 09:40:31 [name] ([id number]) -------------------------------------------------------------------------------- Multi Wound Chart Details Patient Name: [name] Date of Service: 11/28/2017 9:15 AM Medical Record Number: [id number] Patient Account Number: [id number] Date of Birth/Sex: 1918/10/17 (84 y.o. Female) Treating RN: Carolyne Fiscal, Debi Primary Care Ashutosh Dieguez: PATIENT, NO Other Clinician: Referring Jaylan Hinojosa:  Referral, Self Treating Yarenis Cerino/Extender: STONE III, HOYT Weeks in Treatment: 5 Vital Signs Height(in): 64 Pulse(bpm): 34 Weight(lbs): 156 Blood Pressure(mmHg): 139/59 Body Mass Index(BMI): 27 Temperature(F): 97.9 Respiratory Rate 16 (breaths/min): Photos: [6:No Photos] [7:No Photos] [N/A:N/A] Wound Location: [6:Right Toe Great - Medial] [7:Right Foot - Dorsal] [N/A:N/A] Wounding Event: [6:Gradually Appeared] [7:Gradually Appeared] [N/A:N/A] 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] [7:Arterial Insufficiency Ulcer] [N/A:N/A] Comorbid History: [6:Cataracts, Hypertension, Osteoarthritis] [7:Cataracts, Hypertension, Osteoarthritis] [N/A:N/A] Date Acquired: [6:10/16/2017] [7:10/11/2017] [N/A:N/A] Weeks of Treatment: [6:5] [7:5] [N/A:N/A] Wound Status: [6:Open] [7:Open] [N/A:N/A] Measurements L x W x D [6:0.5x0.3x0.1] [7:1.2x1.4x0.2] [N/A:N/A] (cm) Area (cm) : [6:0.118] [7:1.319] [  N/A:N/A] Volume (cm) : [6:0.012] [7:0.264] [N/A:N/A] % Reduction in Area: [6:24.80%] [7:-133.50%] [N/A:N/A] % Reduction in Volume: [6:25.00%] [7:-133.60%] [N/A:N/A] Classification: [6:Partial Thickness] [7:Partial Thickness] [N/A:N/A] Exudate Amount: [6:Large] [7:Large] [N/A:N/A] Exudate Type: [6:Serous] [7:Purulent] [N/A:N/A] Exudate Color: [6:amber] [7:yellow, brown, green] [N/A:N/A] Wound Margin: [6:Distinct, outline attached] [7:Distinct, outline attached] [N/A:N/A] Granulation Amount: [6:Large (67-100%)] [7:Small (1-33%)] [N/A:N/A] Granulation Quality: [6:Red] [7:Pink] [N/A:N/A] Necrotic Amount: [6:Small (1-33%)] [7:Large (67-100%)] [N/A:N/A] Necrotic Tissue: [6:Eschar] [7:Adherent Slough] [N/A:N/A] Exposed Structures: [6:Fascia: No Fat Layer (Subcutaneous Tissue) Exposed: No Tendon: No Muscle: No Joint: No Bone: No] [7:Fat Layer (Subcutaneous Tissue) Exposed: Yes Fascia: No Tendon: No Muscle: No Joint: No Bone: No] [N/A:N/A] Epithelialization: [6:Medium (34-66%)] [7:None]  [N/A:N/A] Periwound Skin Texture: [6:No Abnormalities Noted] [7:Induration: Yes] [N/A:N/A] Periwound Skin Moisture: [6:No Abnormalities Noted] [7:Maceration: Yes] [N/A:N/A] Periwound Skin Color: [6:No Abnormalities Noted] [7:Erythema: Yes] [N/A:N/A] Erythema Location: [6:N/A] [7:Circumferential] [N/A:N/A] Temperature: [6:No Abnormality] [7:No Abnormality] [N/A:N/A] Tenderness on Palpation: Yes Yes N/A Wound Preparation: Ulcer Cleansing: Ulcer Cleansing: N/A Rinsed/Irrigated with Saline Rinsed/Irrigated with Saline Topical Anesthetic Applied: Topical Anesthetic Applied: Other: lidocaine 4% Other: lidocaine 4% Treatment Notes Electronic Signature(s) Signed: 11/28/2017 4:06:48 PM By: Alric Quan Entered By: Alric Quan on 11/28/2017 09:46:50 [name] ([id number]) -------------------------------------------------------------------------------- [place] Details Patient Name: [name] Date of Service: 11/28/2017 9:15 AM Medical Record Number: [id number] Patient Account Number: [id number] Date of Birth/Sex: 1918-12-14 (84 y.o. Female) Treating RN: Carolyne Fiscal, Debi Primary Care Marolyn Urschel: PATIENT, NO Other Clinician: Referring Kendel Bessey: Referral, Self Treating Vicci Reder/Extender: STONE III, HOYT Weeks in Treatment: 5 Active Inactive ` Abuse / Safety / Falls / Self Care Management Nursing Diagnoses: History of Falls Potential for falls Goals: Patient will not experience any injury related to falls Date Initiated: 10/23/2017 Target Resolution Date: 02/17/2018 Goal Status: Active Interventions: Assess Activities of Daily Living upon admission and as needed Assess fall risk on admission and as needed Assess: immobility, friction, shearing, incontinence upon admission and as needed Assess impairment of mobility on admission and as needed per policy Assess personal safety and home safety (as indicated) on admission and as needed Assess self care  needs on admission and as needed Notes: ` Nutrition Nursing Diagnoses: Imbalanced nutrition Potential for alteratiion in Nutrition/Potential for imbalanced nutrition Goals: Patient/caregiver agrees to and verbalizes understanding of need to use nutritional supplements and/or vitamins as prescribed Date Initiated: 10/23/2017 Target Resolution Date: 02/17/2018 Goal Status: Active Interventions: Assess patient nutrition upon admission and as needed per policy Notes: ` Orientation to the Wound Care Program Nursing Diagnoses: SHANIQUA, GUILLOT ([id number]) Knowledge deficit related to the wound healing center program Goals: Patient/caregiver will verbalize understanding of the [place] Program Date Initiated: 10/23/2017 Target Resolution Date: 11/18/2017 Goal Status: Active Interventions: Provide education on orientation to the wound center Notes: ` Pain, Acute or Chronic Nursing Diagnoses: Pain, acute or chronic: actual or potential Potential alteration in comfort, pain Goals: Patient/caregiver will verbalize adequate pain control between visits Date Initiated: 10/23/2017 Target Resolution Date: 02/17/2018 Goal Status: Active Interventions: Complete pain assessment as per visit requirements Notes: ` Wound/Skin Impairment Nursing Diagnoses: Impaired tissue integrity Knowledge deficit related to ulceration/compromised skin integrity Goals: Ulcer/skin breakdown will have a volume reduction of 80% by week 12 Date Initiated: 10/23/2017 Target Resolution Date: 02/17/2018 Goal Status: Active Interventions: Assess patient/caregiver ability to perform ulcer/skin care regimen upon admission and as needed Assess ulceration(s) every visit Notes: Electronic Signature(s) Signed: 11/28/2017 4:06:48 PM By: Alric Quan Entered By: Alric Quan on 11/28/2017 09:46:38 Parrales, Estill Bamberg  ([id number]) -------------------------------------------------------------------------------- Pain Assessment Details  Patient Name: FREDA, JAQUITH Date of Service: 11/28/2017 9:15 AM Medical Record Number: [id number] Patient Account Number: [id number] Date of Birth/Sex: 1919-08-12 (84 y.o. Female) Treating RN: Montey Hora Primary Care Adiba Fargnoli: PATIENT, NO Other Clinician: Referring Adaysha Dubinsky: Referral, Self Treating Laquilla Dault/Extender: STONE III, HOYT Weeks in Treatment: 5 Active Problems Location of Pain Severity and Description of Pain Patient Has Paino No Site Locations Pain Management and Medication Current Pain Management: Electronic Signature(s) Signed: 11/28/2017 4:30:06 PM By: Montey Hora Entered By: Montey Hora on 11/28/2017 09:32:18 [name] ([id number]) -------------------------------------------------------------------------------- Patient/Caregiver Education Details Patient Name: [name] Date of Service: 11/28/2017 9:15 AM Medical Record Number: [id number] Patient Account Number: [id number] Date of Birth/Gender: [birth year] (83 y.o. Female) Treating RN: Carolyne Fiscal, Debi Primary Care Physician: PATIENT, NO Other Clinician: Referring Physician: Referral, Self Treating Physician/Extender: STONE III, HOYT Weeks in Treatment: 5 Education Assessment Education Provided To: Patient Education Topics Provided Wound/Skin Impairment: Handouts: Caring for Your Ulcer, Other: change dressing as ordered Methods: Demonstration, Explain/Verbal Responses: State content correctly Electronic Signature(s) Signed: 11/28/2017 4:06:48 PM By: Alric Quan Entered By: Alric Quan on 11/28/2017 09:57:22 Trinka, Estill Bamberg ([id number]) -------------------------------------------------------------------------------- Wound Assessment Details Patient Name: [name] Date of Service: 11/28/2017 9:15 AM Medical Record Number: [id number] Patient Account Number:  [id number] Date of Birth/Sex: 1919/05/12 (84 y.o. Female) Treating RN: Montey Hora Primary Care Ross Bender: PATIENT, NO Other Clinician: Referring Tasheem Elms: Referral, Self Treating Geetika Laborde/Extender: STONE III, HOYT Weeks in Treatment: 5 Wound Status Wound Number: 6 Primary Etiology: Arterial Insufficiency Ulcer Wound Location: Right Toe Great - Medial Wound Status: Open Wounding Event: Gradually Appeared Comorbid History: Cataracts, Hypertension, Osteoarthritis Date Acquired: 10/16/2017 Weeks Of Treatment: 5 Clustered Wound: No Photos Photo Uploaded By: Montey Hora on 11/28/2017 10:09:16 Wound Measurements Length: (cm) 0.5 Width: (cm) 0.3 Depth: (cm) 0.1 Area: (cm) 0.118 Volume: (cm) 0.012 % Reduction in Area: 24.8% % Reduction in Volume: 25% Epithelialization: Medium (34-66%) Tunneling: No Undermining: No Wound Description Classification: Partial Thickness Wound Margin: Distinct, outline attached Exudate Amount: Large Exudate Type: Serous Exudate Color: amber Foul Odor After Cleansing: No Slough/Fibrino No Wound Bed Granulation Amount: Large (67-100%) Exposed Structure Granulation Quality: Red Fascia Exposed: No Necrotic Amount: Small (1-33%) Fat Layer (Subcutaneous Tissue) Exposed: No Necrotic Quality: Eschar Tendon Exposed: No Muscle Exposed: No Joint Exposed: No Bone Exposed: No Periwound Skin Texture Vancamp, Alveria ([id number]) Texture Color No Abnormalities Noted: No No Abnormalities Noted: No Moisture Temperature / Pain No Abnormalities Noted: No Temperature: No Abnormality Tenderness on Palpation: Yes Wound Preparation Ulcer Cleansing: Rinsed/Irrigated with Saline Topical Anesthetic Applied: Other: lidocaine 4%, Treatment Notes Wound #6 (Right, Medial Toe Great) 1. Cleansed with: Clean wound with Normal Saline 2. Anesthetic Topical Lidocaine 4% cream to wound bed prior to debridement 4. Dressing Applied: Santyl  Ointment Notes coverlet Electronic Signature(s) Signed: 11/28/2017 4:30:06 PM By: Montey Hora Entered By: Montey Hora on 11/28/2017 09:39:41 [name] ([id number]) -------------------------------------------------------------------------------- Wound Assessment Details Patient Name: [name] Date of Service: 11/28/2017 9:15 AM Medical Record Number: [id number] Patient Account Number: [id number] Date of Birth/Sex: [birth year] (84 y.o. Female) Treating RN: Montey Hora Primary Care Reyce Lubeck: PATIENT, NO Other Clinician: Referring Yohana Bartha: Referral, Self Treating Yocheved Depner/Extender: STONE III, HOYT Weeks in Treatment: 5 Wound Status Wound Number: 7 Primary Etiology: Arterial Insufficiency Ulcer Wound Location: Right Foot - Dorsal Wound Status: Open Wounding Event: Gradually Appeared Comorbid History: Cataracts, Hypertension, Osteoarthritis Date Acquired: 10/11/2017 Weeks Of Treatment: 5 Clustered Wound: No Photos Photo Uploaded By: Montey Hora on 11/28/2017 10:09:17 Wound Measurements Length: (cm) 1.2 Width: (cm) 1.4 Depth: (  cm) 0.2 Area: (cm) 1.319 Volume: (cm) 0.264 % Reduction in Area: -133.5% % Reduction in Volume: -133.6% Epithelialization: None Tunneling: No Undermining: No Wound Description Classification: Partial Thickness Wound Margin: Distinct, outline attached Exudate Amount: Large Exudate Type: Purulent Exudate Color: yellow, brown, green Foul Odor After Cleansing: No Slough/Fibrino Yes Wound Bed Granulation Amount: Small (1-33%) Exposed Structure Granulation Quality: Pink Fascia Exposed: No Necrotic Amount: Large (67-100%) Fat Layer (Subcutaneous Tissue) Exposed: Yes Necrotic Quality: Adherent Slough Tendon Exposed: No Muscle Exposed: No Joint Exposed: No Bone Exposed: No Periwound Skin Texture Sweeny, Miryam ([id number]) Texture Color No Abnormalities Noted: No No Abnormalities Noted: No Induration: Yes Erythema:  Yes Erythema Location: Circumferential Moisture No Abnormalities Noted: No Temperature / Pain Maceration: Yes Temperature: No Abnormality Tenderness on Palpation: Yes Wound Preparation Ulcer Cleansing: Rinsed/Irrigated with Saline Topical Anesthetic Applied: Other: lidocaine 4%, Treatment Notes Wound #7 (Right, Dorsal Foot) 1. Cleansed with: Clean wound with Normal Saline 2. Anesthetic Topical Lidocaine 4% cream to wound bed prior to debridement 3. Peri-wound Care: Skin Prep 4. Dressing Applied: Santyl Ointment 5. Secondary Dressing Applied Dry Gauze Tegaderm Electronic Signature(s) Signed: 11/28/2017 4:30:06 PM By: Montey Hora Entered By: Montey Hora on 11/28/2017 09:38:55 [name] ([id number]) -------------------------------------------------------------------------------- [place] Details Patient Name: [name] Date of Service: 11/28/2017 9:15 AM Medical Record Number: [id number] Patient Account Number: [id number] Date of Birth/Sex: 1919-05-10 (84 y.o. Female) Treating RN: Montey Hora Primary Care Olanda Boughner: PATIENT, NO Other Clinician: Referring Glena Pharris: Referral, Self Treating Giavanni Zeitlin/Extender: STONE III, HOYT Weeks in Treatment: 5 Vital Signs Time Taken: 09:32 Temperature (F): 97.9 Height (in): 64 Pulse (bpm): 77 Weight (lbs): 156 Respiratory Rate (breaths/min): 16 Body Mass Index (BMI): 26.8 Blood Pressure (mmHg): 139/59 Reference Range: 80 - 120 mg / dl Electronic Signature(s) Signed: 11/28/2017 4:30:06 PM By: Montey Hora Entered By: Montey Hora on 11/28/2017 09:32:43

## 2017-12-01 ENCOUNTER — Other Ambulatory Visit: Payer: Self-pay | Admitting: Cardiovascular Disease

## 2017-12-01 DIAGNOSIS — I739 Peripheral vascular disease, unspecified: Secondary | ICD-10-CM

## 2017-12-05 ENCOUNTER — Encounter: Payer: Medicare PPO | Admitting: Physician Assistant

## 2017-12-05 DIAGNOSIS — I70235 Atherosclerosis of native arteries of right leg with ulceration of other part of foot: Secondary | ICD-10-CM | POA: Diagnosis not present

## 2017-12-07 NOTE — Progress Notes (Signed)
[name], [name] ([id number]) Visit Report for 12/05/2017 Chief Complaint Document Details Patient Name: TRIVIA, HEFFELFINGER Date of Service: 12/05/2017 11:15 AM Medical Record Number: [id number] Patient Account Number: [id number] Date of Birth/Sex: Nov 25, 1918 (84 y.o. Female) Treating RN: Carolyne Fiscal, Debi Primary Care Provider: PATIENT, NO Other Clinician: Referring Provider: Referral, Self Treating Provider/Extender: STONE III, Paden Senger Weeks in Treatment: 6 Information Obtained from: Patient Chief Complaint Right foot Ulcers Electronic Signature(s) Signed: 12/05/2017 5:35:20 PM By: Worthy Keeler PA-C Entered By: Worthy Keeler on 12/05/2017 11:22:22 [name] ([id number]) -------------------------------------------------------------------------------- Debridement Details Patient Name: [name] Date of Service: 12/05/2017 11:15 AM Medical Record Number: [id number] Patient Account Number: [id number] Date of Birth/Sex: May 16, 1919 (84 y.o. Female) Treating RN: Carolyne Fiscal, Debi Primary Care Provider: PATIENT, NO Other Clinician: Referring Provider: Referral, Self Treating Provider/Extender: STONE III, Walter Min Weeks in Treatment: 6 Debridement Performed for Wound #6 Right,Medial Toe Great Assessment: Performed By: Physician STONE III, Madeleyn Schwimmer E., PA-C Debridement: Debridement Severity of Tissue Pre Fat layer exposed Debridement: Pre-procedure Verification/Time Yes - 11:30 Out Taken: Start Time: 11:30 Pain Control: Lidocaine 4% Topical Solution Level: Skin/Subcutaneous Tissue Total Area Debrided (L x W): 0.2 (cm) x 0.3 (cm) = 0.06 (cm) Tissue and other material Viable, Non-Viable, Exudate, Fibrin/Slough, Subcutaneous debrided: Instrument: Curette Bleeding: Minimum Hemostasis Achieved: Pressure End Time: 11:32 Procedural Pain: 1 Post Procedural Pain: 0 Response to Treatment: Procedure was tolerated well Post Debridement Measurements of Total Wound Length: (cm)  0.2 Width: (cm) 0.3 Depth: (cm) 0.2 Volume: (cm) 0.009 Character of Wound/Ulcer Post Debridement: Improved Severity of Tissue Post Debridement: Fat layer exposed Post Procedure Diagnosis Same as Pre-procedure Electronic Signature(s) Signed: 12/05/2017 5:35:20 PM By: Worthy Keeler PA-C Signed: 12/06/2017 4:30:35 PM By: Alric Quan Entered By: Worthy Keeler on 12/05/2017 17:15:16 [name] ([id number]) -------------------------------------------------------------------------------- Debridement Details Patient Name: [name] Date of Service: 12/05/2017 11:15 AM Medical Record Number: [id number] Patient Account Number: [id number] Date of Birth/Sex: August 12, 1919 (84 y.o. Female) Treating RN: Carolyne Fiscal, Debi Primary Care Provider: PATIENT, NO Other Clinician: Referring Provider: Referral, Self Treating Provider/Extender: STONE III, Darshan Solanki Weeks in Treatment: 6 Debridement Performed for Wound #7 Right,Dorsal Foot Assessment: Performed By: Physician STONE III, Matthe Sloane E., PA-C Debridement: Debridement Severity of Tissue Pre Fat layer exposed Debridement: Pre-procedure Verification/Time Yes - 11:33 Out Taken: Start Time: 11:33 Pain Control: Lidocaine 4% Topical Solution Level: Skin/Subcutaneous Tissue Total Area Debrided (L x W): 1.1 (cm) x 1.2 (cm) = 1.32 (cm) Tissue and other material Viable, Non-Viable, Exudate, Fibrin/Slough, Subcutaneous debrided: Instrument: Curette Bleeding: Minimum Hemostasis Achieved: Pressure End Time: 11:35 Procedural Pain: 1 Post Procedural Pain: 0 Response to Treatment: Procedure was tolerated well Post Debridement Measurements of Total Wound Length: (cm) 1.1 Width: (cm) 1.2 Depth: (cm) 0.3 Volume: (cm) 0.311 Character of Wound/Ulcer Post Debridement: Improved Severity of Tissue Post Debridement: Fat layer exposed Post Procedure Diagnosis Same as Pre-procedure Electronic Signature(s) Signed: 12/05/2017 5:35:20 PM By:  Worthy Keeler PA-C Signed: 12/06/2017 4:30:35 PM By: Alric Quan Entered By: Worthy Keeler on 12/05/2017 17:17:35 [name] ([id number]) -------------------------------------------------------------------------------- HPI Details Patient Name: [name] Date of Service: 12/05/2017 11:15 AM Medical Record Number: [id number] Patient Account Number: [id number] Date of Birth/Sex: [birth year] (84 y.o. Female) Treating RN: Carolyne Fiscal, Debi Primary Care Provider: PATIENT, NO Other Clinician: Referring Provider: Referral, Self Treating Provider/Extender: STONE III, Tylie Golonka Weeks in Treatment: 6 History of Present Illness HPI Description: 04/06/16; this is a 84 year old woman who arrives accompanied by 2 daughters for a wound on her left ankle and her left fifth toe.  These have apparently been present for a year. I'm not quite certain how she came to this clinic however she was being followed by Sharlotte Alamo her podiatrist for these wounds. She was also referred to [facility] and they apparently did a test presumably arterial studies although we don't have any of these results and we couldn't get through to the office today. The family but has been applying a combination of Bactroban and a light bandage and perhaps more recently Silvadene cream. She did have an x-ray of the foot roughly 6 months ago at the podiatry office the family was unaware that if there were any abnormalities. Apparently they have not seen any healing here. Our intake nurse noted a slight skin tear on the right anterior lower leg. Nobody seemed aware of this. ABIs calculated in this clinic was 0.3 on the right and 0.4 on the left I have reviewed things in [facility]. There is very little information on this patient. She apparently follows in current total clinic which we don't have information from. She has mentioned already been to a [facility]. She has a history of hypothyroidism, nephrolithiasis  arthritis and has had a previous mastectomy. 04/13/16; patient's x-ray was normal. She is already been to see Dr. Delana Meyer vascular surgery. Her arterial exam was from November 2016 this showed a left ABI of 0.62 her right of 0.76. Her duplex ultrasound of the left leg showed biphasic waves in the common femoral and distal femoral artery however monophasic waves in the superficial femoral artery proximal and mid biphasic it distal. 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 The patient tells me that she has pain at night when she tries to lie down which is improved by getting up and sitting in the chair this sounds like claudication at rest. Her wounds are on the left medial malleolus and the dorsal fifth toe small punched out wounds that are right on bone. We use Santyl last week 04/20/16: nurse informed me pt has declined evaluation for significant PAD. she denies systemic s/s of infections. 04/27/16;; the patient has had noninvasive arterial studies done in November 2016. ABI and the left was 0.62. Monophasic waves at the superficial femoral artery. Occluded to the posterior tibial artery. So had greater than 50% stenosis of the right superficial femoral and greater than 50% stenosis of the left superficial femoral artery. She had bilateral tibial peroneal artery disease. Both of the wounds on the left fifth toe and left lateral malleolus have been present for more than a year. They have been to see vein and vascular. The patient has some pain but miraculously I think the wounds have largely been stable. No evidence of infection 05/04/16; she goes for a noninvasive study tomorrow and then sees Dr. Fletcher Anon on Monday. By the time she is here next week we should have a better picture of whether something can be done with regards to her arterial flow. We continue to have ischemic-looking wounds on the left fifth toe and left lateral malleolus. 05/10/16; the patient went for her arterial studies and saw Dr.  Fletcher Anon. As predicted she is felt to have critical limb ischemia. The feeling is that she has occlusion of the SFA. The feeling would she would be a candidate for a stent to the SFA. The patient did not make a decision to proceed with the procedure and she is here with family members to discuss this me today. He shouldn't has a lot of pain and cannot sleep and rest  well at night per her family. 05/24/16; the patient went and had a complex revascularization/angioplasty of the left superficial femoral artery followed by drug-coated balloon angioplasty and spots stenting. She tolerated the procedure well. She was recommended for dual antiplatelet drugs with Plavix and aspirin for at least a month. She went yesterday for I believe follow-up serial Dopplers and ABIs although I don't see these results. The patient is unfortunately complaining of a lot of pain in her bilateral lower legs below the knees from the ankle to the knees. She apparently was prescribed lidocaine and apparently put this on her legs instead of over the wound areas. This may have something to do with it however there is a lot of edema in her bilateral legs I was able to find her arterial studies from yesterday. The left ABI has improved up to 0.66 post left SFA stent. The bilateral great toe indices remain abnormal with the left being in the 0.25 range. Duplex ultrasound showed her left SFA stent is patent monophasic waveforms persist in the left leg 06/07/16; continued punched-out areas over the dorsal left fifth toe and left lateral malleolus. No major improvement 06/28/16; the areas over her dorsal left fifth toe and left lateral malleolus are covered in surface slough we are using Iodoflex 07/05/16. We have been using Iodoflex for 2-3 weeks now. I have not been debridement is because of pain [name], [name] ([id number]) 07/19/16 currently we have been using Iodoflex for roughly the past month. Previously we were unable to debride due  to pain although today patient states that the pain is not nearly as severe as it has been in the past. In fact she rates this to be a 1 out of 10 and it worse to a to 10 with palpation of the wound. All and all her and her daughter feel like this is actually doing steadily better at this point in time. She is pleased with progress and we have been seeing her every 2 weeks. 08/02/16; this is a delightful 84 year old woman I have not seen in over a month. She has arterial insufficiency wounds remaining over the left lateral malleolus and the left fifth toe. With the help of Dr. Fletcher Anon we are able to get her revascularized on the left. The 2 wounds on the left have been making good progress and are definitely smaller especially the area over the left lateral malleolus. She is certainly in a lot less pain than she used to be although I still think there is some claudication type pain. Unfortunately she is developed a area on the right lateral foot which is a small open area but I think is threatened. I suspect a small ischemic areas well. Also on her right fifth toe there is an area that is not open but looks as though it is receiving too much pressure from footwear. Finally an area over her right malleolus although I don't think this is on its way to anything ominous 08/17/16 patient presents today for follow up evaluation she tells me that she is really doing fairly well from a pain standpoint compared to where she has been previous. She is tolerating the dressing changes without any complication. She continues to have discharge and drainage however. 08-30-16 Ms. Coombes presents today with her daughter, she states that she continues to have intermittent shooting pains to the left lateral fifth toe at this site of seems to be a healed ulcer. She denies any other issues that her wound related since her last  appointment. Her daughter states that she is in need of a tramadol refill at this time as she  uses half a tablet at at bedtime to aid in sleep due to foot pain. Iodoflex has been used on all wounds in previous dressing changes. 09/13/16; the patient has ischemic wounds in her feet. The area over the left lateral malleolus is healed and the area over the left fifth toe looks improved.. She has an area on the lateral aspect of her right foot which is a small but deep wound. Finally she has a new open area on the medial aspect of the left medial malleolus. This is superficial 09/27/16; the area over the left lateral malleolus remains healed. The area over the left fifth toe has a surface and she states the pain is better but I don't think this is completely closed. She has an area on the lateral aspect of her right foot is a small but deep wound. The new open area on the medial aspect of the left ankle was closed from last time. 10/18/16; the area over her left lateral malleolus remains healed. The area over the left fifth toe has a surface over the top of this however there is no overt open area. Given the underlying issues of severe PAD and continued pain in the toe I would think it would be unlikely this is truly healed however I am not planning to debridement this area. The area on the right lateral foot which was a more recent wound is a small punched out painful area again has significant surface slough and nonviable tissue. It is clear the patient still has claudication type pain however she remains functional. I have been giving her tramadol when necessary and that seems to help a lot with her pain the patient follows up with Dr. Fletcher Anon on January 18/18 11/01/16; patient missed her follow-up with Dr. Fletcher Anon last week due to a snow day. Follow-up is now on February 15. The areas on the right lateral malleolus and dorsal right fifth toe remain closed over. The fifth toe was tentative is there is a surface eschar however I'm not going to disturb this. Therefore, her only open areas on the right  lateral foot. This is a small punched out area. We have been using Prisma. I'm quite convinced this is an ischemic wound 11/15/16; patient has follow-up with Dr. Fletcher Anon on February 15. She has no open wound on the left leg and doesn't really complain of claudication that I can determine from talking to her. However on the right leg she clearly has some degree of claudication. The remaining open wound is on the right lateral foot. We have been using Iodosorb ointment 11/29/16; patient saw Dr. Fletcher Anon on 11/24/16. His comment is that her wound on her right lateral foot seems to be improving with local wound care. She has known significant right SFA disease. Her lower extremity Doppler will be repeated and according to the patient's daughter that appointment is on March 15. He is left with the thought that endovascular intervention in the right SFA might be necessary. The patient has quite a bit of pain especially at night related to the wound in her right foot. We changed her to Iodosorb ointment last week 12/13/16; this is a patient I follow every 2 weeks largely on a palliative approach at this point about ischemic wounds currently in the right foot. Initially she had them on her left lateral malleolus and left fifth toe. The area on the  left lateral malleolus healed and the fifth toe has a surface eschar that I have elected not to remove. Both of these improved after revascularization by Dr. Fletcher Anon. We have been using Iodosorb to the right lateral foot not much change here. 12/27/16; the patient had her arterial studies. This showed known bilateral SFA disease. Stable right ABI 0.5 to stable left ABI at 0.7 to the did not do it TBI on the right it was 0.61 on the left she has a stent in the long segment of her left SFA from 05/18/16. All of her wounds are somewhat better. She states her pain is better in the right foot is improved. 01/10/17- patient is here for follow-up dilation of her right lateral foot ulcer.  She has been tolerating Iodosorb. She is voices no complaints or concerns 01/24/17; small ischemic wound on right lateral foot. still non viable cover. follows with Dr. Fletcher Anon on 4/30. No open area on left foot [name], [name] ([id number]) 02/07/17 doing well and states she is pain free. Saw Dr. Fletcher Anon who said she is doing well and has "50% flow in the right leg and 70% in the left. According to her daughter he does not wish to do any further interventions 02/21/17; small ischemic wound on the right lateral foot. Per her daughter and the patient she has no open wound on the left foot and ankle which were her initial presenting wounds. still has claudication type pain at night she is been to see interventional cardiology who does not feel that she has a need for intervention on the right leg at present. She still has claudication type pain in the right leg which she manages at night with when necessary tramadol that I have prescribed 03/14/17; small ischemic wound on the right lateral foot. They've been dressing this at home with Iodosorb ointment. The patient does not complain of any pain. The wound has a surface eschar over it and there is really no visible open area. This is similar to how the areas on the left leg healed 04/11/17; the small ischemic wound on her right lateral foot is finally closed over. Small amount of eschar over the surface however I did not attempt to remove this. The patient claims to be asymptomatic she is not having any claudication that I'm able to elicit although her activity is limited Readmission: 10/23/17 on evaluation today patient appears to be doing somewhat poorly in regard to her right foot where she has two ulcers at this point. The worst is on the lateral aspect of her foot medial first metatarsal region is not nearly as bad. With that being said these did arise seemingly out of nowhere she has previously had a similar issue in the past these have always  been ischemic wounds due to arterial insufficiency. She does see Dr. Fletcher Anon as her vascular specialist and it has been noted that her ABI's are somewhat low. She actually has a repeat evaluation coming up this March 2019 to reevaluate her blood flow. On the last check 12/22/16 it was noted that patient had stable ABI's in the lower moderate abnormal range in regard to the right in regard to the left and the upper moderate at normal range. Patient is having some discomfort though nothing severe at this point in time. She is seen with her daughter and son-in-law today. No fevers, chills, nausea, or vomiting noted at this time. 10/30/17 on evaluation today patient appears to be doing well in regard to her wounds. The right  lateral foot wound appears to show signs of cleaning up nicely there is granulation noted underneath that the bed of the wound although there still some Slough covering. Nonetheless she still has a lot of discomfort and I really do not want to proceed with debridement due to the fact that she does have so much discomfort. Good news is the tramadol has been helping her at bedtime she only takes this once a day and that has been extremely beneficial. Unfortunately she does not have a primary right now as she is awaiting a new one which is the reason that I am prescribing the tramadol. Nonetheless I'm pleased with how things seem to be progressing in regard to her ulcers. 11/06/17 on evaluation today patient appears to be doing decently well as far as maintaining in regard to her ulcers. Unfortunately I did review her arterial study which shows that she has on the right arterial obstruction involving the superficial femoral and popliteal artery as well is the superficial femoral artery with a high grade stenosis versus inclusion. Collateral flow noted in the distal portion of the SFA. Severe progression is noted compared to previous study. Unfortunately being the patient's wounds do not  seem to be healing as appropriately and quickly as we would like I do believe this is likely a result of poor vascular flow to the right lower extremity. This was discussed with patient and her daughter during the office visit today. 11/14/17 on evaluation today patient continues to have a fairly stalled ulcer in regard to her to ulcers on the right lower extremity. She obviously did have significant findings on her arterial study and I do believe this is again due to poor vascular flow. She does have an appointment later this afternoon with her vascular specialist for further evaluation to see if there any recommendations at that point. With that being said she continues to have discomfort which I do believe is arterial in nature in regard to insufficiency. 11/21/17 on evaluation today patient appears to be doing fairly well in regard to the appearance of her wounds other than the fact that she does have erythema surrounding the wound bed at this point in fact this encompasses the majority of the dorsal surface of her right foot. There is additional granulation noted today that was not noted previously on evaluation and again if that were alone were the findings that we were seeing I would be very happy with the current progress. However unfortunately she is having the erythema which concerns me for the possibility of infection although the other possibility is that she could just be having a local reaction due to the I resort. In the past she never had this issue however when she use this for the root left foot which makes me again come back to my concern about infection. 11/28/17 on evaluation today patient is status post having had an angiogram performed on 11/22/17 where it was noted that she had severe disease involving the TP trunk and proximal peroneal artery. Subsequently she underwent successful atherectomy and drug coated balloon angioplasty to the right SFA, TP trunk, and proximal peroneal  artery. Patient stated only of has 10% residual stenosis and overall the procedure at the time was both tolerated well as well as considered a success. This was performed by Dr. Sophronia Simas. Patient was subsequently discharged home on 11/23/17 from the hospital. Overall she states that her leg pain is not nearly as severe as what it was previous which is excellent news.  She also has been sleeping with her [name], [name] ([id number]) leg in the bed previously she was hanging this over the edge due to the pain. She also seems to have much better blood flow on evaluation today. 12/05/17 on evaluation today patient appears to be doing very well in regard to her quite lateral great toe as well as the right lateral foot ulcer. She has excellent blood flow and great capillary refill noted on evaluation today which is excellent news. With that being said she does have discomfort but mainly at the site of the wounds especially lateral foot wound but nothing like what she was experiencing previously. This is rated to be a 3/10 at most. She is seen with her daughter present. Electronic Signature(s) Signed: 12/05/2017 5:35:20 PM By: Worthy Keeler PA-C Entered By: Worthy Keeler on 12/05/2017 17:10:06 [name] ([id number]) -------------------------------------------------------------------------------- Physical Exam Details Patient Name: [name] Date of Service: 12/05/2017 11:15 AM Medical Record Number: [id number] Patient Account Number: [id number] Date of Birth/Sex: 10-Jun-1919 (84 y.o. Female) Treating RN: Carolyne Fiscal, Debi Primary Care Provider: PATIENT, NO Other Clinician: Referring Provider: Referral, Self Treating Provider/Extender: STONE III, Amariyana Heacox Weeks in Treatment: 6 Constitutional Well-nourished and well-hydrated in no acute distress. Respiratory normal breathing without difficulty. Psychiatric this patient is able to make decisions and demonstrates good insight into disease  process. Alert and Oriented x 3. pleasant and cooperative. Notes Currently patient has no evidence of infection at this time in regard to her extremities she has less than three second Layer we feel which is excellent news as well. Overall I'm very pleased with the progress she's made she does continue to wear her compression stockings which is good news in the overall color of her foot has returned and appears much more healthy versus the cyanosis we were noting previously. She did tolerate sharp debridement of both wounds today with excellent results and post debridement there was much more granulation noted especially the right first great toe ulcer. Electronic Signature(s) Signed: 12/05/2017 5:35:20 PM By: Worthy Keeler PA-C Entered By: Worthy Keeler on 12/05/2017 17:11:05 [name] ([id number]) -------------------------------------------------------------------------------- Physician Orders Details Patient Name: [name] Date of Service: 12/05/2017 11:15 AM Medical Record Number: [id number] Patient Account Number: [id number] Date of Birth/Sex: [birth year] (84 y.o. Female) Treating RN: Carolyne Fiscal, Debi Primary Care Provider: PATIENT, NO Other Clinician: Referring Provider: Referral, Self Treating Provider/Extender: STONE III, Cruze Zingaro Weeks in Treatment: 6 Verbal / Phone Orders: Yes Clinician: Pinkerton, Debi Read Back and Verified: Yes Diagnosis Coding ICD-10 Coding Code Description I70.235 Atherosclerosis of native arteries of right leg with ulceration of other part of foot I70.232 Atherosclerosis of native arteries of right leg with ulceration of calf L97.512 Non-pressure chronic ulcer of other part of right foot with fat layer exposed I10 Essential (primary) hypertension Wound Cleansing Wound #6 Right,Medial Toe Great o Clean wound with Normal Saline. o Cleanse wound with mild soap and water o May Shower, gently pat wound dry prior to applying new  dressing. Wound #7 Right,Dorsal Foot o Clean wound with Normal Saline. o Cleanse wound with mild soap and water o May Shower, gently pat wound dry prior to applying new dressing. Anesthetic (add to Medication List) Wound #6 Right,Medial Toe Great o Topical Lidocaine 4% cream applied to wound bed prior to debridement (In Clinic Only). Wound #7 Right,Dorsal Foot o Topical Lidocaine 4% cream applied to wound bed prior to debridement (In Clinic Only). Skin Barriers/Peri-Wound Care Wound #7 Right,Dorsal Foot o Skin Prep Primary Wound Dressing Wound #  6 Right,Medial Toe Great o Prisma Ag - moisten with saline Wound #7 Right,Dorsal Foot o Prisma Ag - moisten with saline Secondary Dressing Wound #6 Right,Medial Toe Great o Other - coverlet Wound #7 Right,Dorsal Foot o Dry Gauze [name], [name] ([id number]) o Tegaderm Dressing Change Frequency Wound #6 Right,Medial Toe Great o Change dressing every day. Wound #7 Right,Dorsal Foot o Change dressing every day. Follow-up Appointments Wound #6 Right,Medial Toe Great o Return Appointment in 1 week. Wound #7 Right,Dorsal Foot o Return Appointment in 1 week. Edema Control Wound #6 Right,Medial Toe Great o Elevate legs to the level of the heart and pump ankles as often as possible Wound #7 Right,Dorsal Foot o Elevate legs to the level of the heart and pump ankles as often as possible Off-Loading Wound #6 Right,Medial Toe Great o Turn and reposition every 2 hours Wound #7 Right,Dorsal Foot o Turn and reposition every 2 hours Additional Orders / Instructions Wound #6 Right,Medial Toe Great o Increase protein intake. o Other: - Vitamin C, Zinc Wound #7 Right,Dorsal Foot o Increase protein intake. o Other: - Vitamin C, Zinc Patient Medications Allergies: codeine Notifications Medication Indication Start End lidocaine DOSE 1 - topical 4 % cream - 1 cream topical Electronic  Signature(s) Signed: 12/05/2017 5:35:20 PM By: Worthy Keeler PA-C Signed: 12/06/2017 4:30:35 PM By: Alric Quan Entered By: Alric Quan on 12/05/2017 11:45:39 [name] ([id number]) [name], [name] ([id number]) -------------------------------------------------------------------------------- Prescription 12/05/2017 Patient Name: [name] Provider: Worthy Keeler PA-C Date of Birth: Aug 03, 1919 NPI#: [id number] Sex: F DEA#: [id number] Phone #: [phone] License #: Patient Address: [name] [place], [place] [address] [address] [place], [place] [address] [phone] Allergies codeine Reaction: sick on stomach Severity: Severe Medication Medication: Route: Strength: Form: lidocaine topical 4% cream Class: TOPICAL LOCAL ANESTHETICS Dose: Frequency / Time: Indication: 1 1 cream topical Number of Refills: Number of Units: 0 Generic Substitution: Start Date: End Date: Administered at [place]: Yes Time Administered: Time Discontinued: Note to Pharmacy: Signature(s): Date(s): Electronic Signature(s) Signed: 12/05/2017 5:35:20 PM By: Worthy Keeler PA-C Signed: 12/06/2017 4:30:35 PM By: Myriam Jacobson, Jelani ([id number]) Entered By: Alric Quan on 12/05/2017 11:45:40 [name] ([id number]) --------------------------------------------------------------------------------  Problem List Details Patient Name: [name] Date of Service: 12/05/2017 11:15 AM Medical Record Number: [id number] Patient Account Number: [id number] Date of Birth/Sex: May 18, 1919 (84 y.o. 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 10/23/2017 Yes L97.512 Non-pressure chronic ulcer of other part of right foot with fat layer 10/23/2017 Yes exposed [place] (primary) hypertension 10/24/2017 Yes Inactive Problems Resolved Problems Electronic Signature(s) Signed: 12/05/2017 5:35:20 PM By: Worthy Keeler PA-C Entered By: Worthy Keeler on 12/05/2017 11:22:10 [name] ([id number]) -------------------------------------------------------------------------------- Progress Note Details Patient Name: [name] Date of Service: 12/05/2017 11:15 AM Medical Record Number: [id number] Patient Account Number: [id number] Date of Birth/Sex: 04/09/1919 (84 y.o. Female) Treating RN: Carolyne Fiscal, Debi Primary Care Provider: PATIENT, NO Other Clinician: Referring Provider: Referral, Self Treating Provider/Extender: STONE III, Demarrion Meiklejohn Weeks in Treatment: 6 Subjective Chief Complaint Information obtained from Patient Right foot Ulcers History of Present Illness (HPI) 04/06/16; this  is a 84 year old woman who arrives accompanied by 2 daughters for a wound on her left ankle and her left fifth toe. These have apparently been present for a year. I'm not quite certain how she came to this clinic however she was being followed by Sharlotte Alamo her podiatrist for these wounds. She was also referred to [facility] and they apparently did a test presumably arterial studies although we don't have any of these results and we couldn't get through to the office today. The family but has been applying a combination of Bactroban and a light bandage and perhaps more recently Silvadene cream. She did have an x-ray of the foot roughly 6 months ago at the podiatry office the family was unaware that if there were  any abnormalities. Apparently they have not seen any healing here. Our intake nurse noted a slight skin tear on the right anterior lower leg. Nobody seemed aware of this. ABIs calculated in this clinic was 0.3 on the right and 0.4 on the left I have reviewed things [facility]. There is very little information on this patient. She apparently follows in current total clinic which we don't have information from. She has mentioned already been to a [facility]. She has a history of hypothyroidism, nephrolithiasis arthritis and has had a previous mastectomy. 04/13/16; patient's x-ray was normal. She is already been to see Dr. Delana Meyer vascular surgery. Her arterial exam was from November 2016 this showed a left ABI of 0.62 her right of 0.76. Her duplex ultrasound of the left leg showed biphasic waves in the common femoral and distal femoral artery however monophasic waves in the superficial femoral artery proximal and mid biphasic it distal. 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 The patient tells me that she has pain at night when she tries to lie down which is improved by getting up and sitting in the chair this sounds like claudication at rest. Her wounds are on the left medial malleolus and the dorsal fifth toe small punched out wounds that are right on bone. We use Santyl last week 04/20/16: nurse informed me pt has declined evaluation for significant PAD. she denies systemic s/s of infections. 04/27/16;; the patient has had noninvasive arterial studies done in November 2016. ABI and the left was 0.62. Monophasic waves at the superficial femoral artery. Occluded to the posterior tibial artery. So had greater than 50% stenosis of the right superficial femoral and greater than 50% stenosis of the left superficial femoral artery. She had bilateral tibial peroneal artery disease. Both of the wounds on the left fifth toe and left lateral malleolus have been present for more than a year. They have been to  see vein and vascular. The patient has some pain but miraculously I think the wounds have largely been stable. No evidence of infection 05/04/16; she goes for a noninvasive study tomorrow and then sees Dr. Fletcher Anon on Monday. By the time she is here next week we should have a better picture of whether something can be done with regards to her arterial flow. We continue to have ischemic-looking wounds on the left fifth toe and left lateral malleolus. 05/10/16; the patient went for her arterial studies and saw Dr. Fletcher Anon. As predicted she is felt to have critical limb ischemia. The feeling is that she has occlusion of the SFA. The feeling would she would be a candidate for a stent to the SFA. The patient did not make a decision to proceed with the procedure and she  is here with family members to discuss this me today. He shouldn't has a lot of pain and cannot sleep and rest well at night per her family. 05/24/16; the patient went and had a complex revascularization/angioplasty of the left superficial femoral artery followed by drug-coated balloon angioplasty and spots stenting. She tolerated the procedure well. She was recommended for dual antiplatelet drugs with Plavix and aspirin for at least a month. She went yesterday for I believe follow-up serial Dopplers and ABIs although I don't see these results. The patient is unfortunately complaining of a lot of pain in her bilateral lower legs below the knees from the ankle to the knees. She apparently was prescribed lidocaine and apparently put this on her legs instead of over the wound areas. This may have something to do with it however there is a lot of edema in her bilateral legs I was able to find her arterial studies from yesterday. The left ABI has improved up to 0.66 post left SFA stent. The bilateral Scholten, Lynnmarie ([id number]) great toe indices remain abnormal with the left being in the 0.25 range. Duplex ultrasound showed her left SFA stent is  patent monophasic waveforms persist in the left leg 06/07/16; continued punched-out areas over the dorsal left fifth toe and left lateral malleolus. No major improvement 06/28/16; the areas over her dorsal left fifth toe and left lateral malleolus are covered in surface slough we are using Iodoflex 07/05/16. We have been using Iodoflex for 2-3 weeks now. I have not been debridement is because of pain 07/19/16 currently we have been using Iodoflex for roughly the past month. Previously we were unable to debride due to pain although today patient states that the pain is not nearly as severe as it has been in the past. In fact she rates this to be a 1 out of 10 and it worse to a to 10 with palpation of the wound. All and all her and her daughter feel like this is actually doing steadily better at this point in time. She is pleased with progress and we have been seeing her every 2 weeks. 08/02/16; this is a delightful 84 year old woman I have not seen in over a month. She has arterial insufficiency wounds remaining over the left lateral malleolus and the left fifth toe. With the help of Dr. Fletcher Anon we are able to get her revascularized on the left. The 2 wounds on the left have been making good progress and are definitely smaller especially the area over the left lateral malleolus. She is certainly in a lot less pain than she used to be although I still think there is some claudication type pain. Unfortunately she is developed a area on the right lateral foot which is a small open area but I think is threatened. I suspect a small ischemic areas well. Also on her right fifth toe there is an area that is not open but looks as though it is receiving too much pressure from footwear. Finally an area over her right malleolus although I don't think this is on its way to anything ominous 08/17/16 patient presents today for follow up evaluation she tells me that she is really doing fairly well from a pain  standpoint compared to where she has been previous. She is tolerating the dressing changes without any complication. She continues to have discharge and drainage however. 08-30-16 Ms. Denne presents today with her daughter, she states that she continues to have intermittent shooting pains to the left lateral fifth  toe at this site of seems to be a healed ulcer. She denies any other issues that her wound related since her last appointment. Her daughter states that she is in need of a tramadol refill at this time as she uses half a tablet at at bedtime to aid in sleep due to foot pain. Iodoflex has been used on all wounds in previous dressing changes. 09/13/16; the patient has ischemic wounds in her feet. The area over the left lateral malleolus is healed and the area over the left fifth toe looks improved.. She has an area on the lateral aspect of her right foot which is a small but deep wound. Finally she has a new open area on the medial aspect of the left medial malleolus. This is superficial 09/27/16; the area over the left lateral malleolus remains healed. The area over the left fifth toe has a surface and she states the pain is better but I don't think this is completely closed. She has an area on the lateral aspect of her right foot is a small but deep wound. The new open area on the medial aspect of the left ankle was closed from last time. 10/18/16; the area over her left lateral malleolus remains healed. The area over the left fifth toe has a surface over the top of this however there is no overt open area. Given the underlying issues of severe PAD and continued pain in the toe I would think it would be unlikely this is truly healed however I am not planning to debridement this area. The area on the right lateral foot which was a more recent wound is a small punched out painful area again has significant surface slough and nonviable tissue. It is clear the patient still has claudication  type pain however she remains functional. I have been giving her tramadol when necessary and that seems to help a lot with her pain the patient follows up with Dr. Fletcher Anon on January 18/18 11/01/16; patient missed her follow-up with Dr. Fletcher Anon last week due to a snow day. Follow-up is now on February 15. The areas on the right lateral malleolus and dorsal right fifth toe remain closed over. The fifth toe was tentative is there is a surface eschar however I'm not going to disturb this. Therefore, her only open areas on the right lateral foot. This is a small punched out area. We have been using Prisma. I'm quite convinced this is an ischemic wound 11/15/16; patient has follow-up with Dr. Fletcher Anon on February 15. She has no open wound on the left leg and doesn't really complain of claudication that I can determine from talking to her. However on the right leg she clearly has some degree of claudication. The remaining open wound is on the right lateral foot. We have been using Iodosorb ointment 11/29/16; patient saw Dr. Fletcher Anon on 11/24/16. His comment is that her wound on her right lateral foot seems to be improving with local wound care. She has known significant right SFA disease. Her lower extremity Doppler will be repeated and according to the patient's daughter that appointment is on March 15. He is left with the thought that endovascular intervention in the right SFA might be necessary. The patient has quite a bit of pain especially at night related to the wound in her right foot. We changed her to Iodosorb ointment last week 12/13/16; this is a patient I follow every 2 weeks largely on a palliative approach at this point about ischemic wounds  currently in the right foot. Initially she had them on her left lateral malleolus and left fifth toe. The area on the left lateral malleolus healed and the fifth toe has a surface eschar that I have elected not to remove. Both of these improved after revascularization by  Dr. Fletcher Anon. We have been using Iodosorb to the right lateral foot not much change here. 12/27/16; the patient had her arterial studies. This showed known bilateral SFA disease. Stable right ABI 0.5 to stable left ABI at 0.7 to the did not do it TBI on the right it was 0.61 on the left she has a stent in the long segment of her left SFA from [place], [name] ([id number]) 05/18/16. All of her wounds are somewhat better. She states her pain is better in the right foot is improved. 01/10/17- patient is here for follow-up dilation of her right lateral foot ulcer. She has been tolerating Iodosorb. She is voices no complaints or concerns 01/24/17; small ischemic wound on right lateral foot. still non viable cover. follows with Dr. Fletcher Anon on 4/30. No open area on left foot 02/07/17 doing well and states she is pain free. Saw Dr. Fletcher Anon who said she is doing well and has "50% flow in the right leg and 70% in the left. According to her daughter he does not wish to do any further interventions 02/21/17; small ischemic wound on the right lateral foot. Per her daughter and the patient she has no open wound on the left foot and ankle which were her initial presenting wounds. still has claudication type pain at night she is been to see interventional cardiology who does not feel that she has a need for intervention on the right leg at present. She still has claudication type pain in the right leg which she manages at night with when necessary tramadol that I have prescribed 03/14/17; small ischemic wound on the right lateral foot. They've been dressing this at home with Iodosorb ointment. The patient does not complain of any pain. The wound has a surface eschar over it and there is really no visible open area. This is similar to how the areas on the left leg healed 04/11/17; the small ischemic wound on her right lateral foot is finally closed over. Small amount of eschar over the surface however I did not attempt to remove  this. The patient claims to be asymptomatic she is not having any claudication that I'm able to elicit although her activity is limited Readmission: 10/23/17 on evaluation today patient appears to be doing somewhat poorly in regard to her right foot where she has two ulcers at this point. The worst is on the lateral aspect of her foot medial first metatarsal region is not nearly as bad. With that being said these did arise seemingly out of nowhere she has previously had a similar issue in the past these have always been ischemic wounds due to arterial insufficiency. She does see Dr. Fletcher Anon as her vascular specialist and it has been noted that her ABI's are somewhat low. She actually has a repeat evaluation coming up this March 2019 to reevaluate her blood flow. On the last check 12/22/16 it was noted that patient had stable ABI's in the lower moderate abnormal range in regard to the right in regard to the left and the upper moderate at normal range. Patient is having some discomfort though nothing severe at this point in time. She is seen with her daughter and son-in-law today. No fevers, chills, nausea, or  vomiting noted at this time. 10/30/17 on evaluation today patient appears to be doing well in regard to her wounds. The right lateral foot wound appears to show signs of cleaning up nicely there is granulation noted underneath that the bed of the wound although there still some [facility] covering. Nonetheless she still has a lot of discomfort and I really do not want to proceed with debridement due to the fact that she does have so much discomfort. Good news is the tramadol has been helping her at bedtime she only takes this once a day and that has been extremely beneficial. Unfortunately she does not have a primary right now as she is awaiting a new one which is the reason that I am prescribing the tramadol. Nonetheless I'm pleased with how things seem to be progressing in regard to her  ulcers. 11/06/17 on evaluation today patient appears to be doing decently well as far as maintaining in regard to her ulcers. Unfortunately I did review her arterial study which shows that she has on the right arterial obstruction involving the superficial femoral and popliteal artery as well is the superficial femoral artery with a high grade stenosis versus inclusion. Collateral flow noted in the distal portion of the SFA. Severe progression is noted compared to previous study. Unfortunately being the patient's wounds do not seem to be healing as appropriately and quickly as we would like I do believe this is likely a result of poor vascular flow to the right lower extremity. This was discussed with patient and her daughter during the office visit today. 11/14/17 on evaluation today patient continues to have a fairly stalled ulcer in regard to her to ulcers on the right lower extremity. She obviously did have significant findings on her arterial study and I do believe this is again due to poor vascular flow. She does have an appointment later this afternoon with her vascular specialist for further evaluation to see if there any recommendations at that point. With that being said she continues to have discomfort which I do believe is arterial in nature in regard to insufficiency. 11/21/17 on evaluation today patient appears to be doing fairly well in regard to the appearance of her wounds other than the fact that she does have erythema surrounding the wound bed at this point in fact this encompasses the majority of the dorsal surface of her right foot. There is additional granulation noted today that was not noted previously on evaluation and again if that were alone were the findings that we were seeing I would be very happy with the current progress. However unfortunately she is having the erythema which concerns me for the possibility of infection although the other possibility is that she could  just be having a local reaction due to the I resort. In the past she never had this issue however when she use this for the root left foot which makes me again come back to my concern about infection. [name], [name] ([id number]) 11/28/17 on evaluation today patient is status post having had an angiogram performed on 11/22/17 where it was noted that she had severe disease involving the TP trunk and proximal peroneal artery. Subsequently she underwent successful atherectomy and drug coated balloon angioplasty to the right SFA, TP trunk, and proximal peroneal artery. Patient stated only of has 10% residual stenosis and overall the procedure at the time was both tolerated well as well as considered a success. This was performed by Dr. Sophronia Simas. Patient was subsequently discharged home on  11/23/17 from the hospital. Overall she states that her leg pain is not nearly as severe as what it was previous which is excellent news. She also has been sleeping with her leg in the bed previously she was hanging this over the edge due to the pain. She also seems to have much better blood flow on evaluation today. 12/05/17 on evaluation today patient appears to be doing very well in regard to her quite lateral great toe as well as the right lateral foot ulcer. She has excellent blood flow and great capillary refill noted on evaluation today which is excellent news. With that being said she does have discomfort but mainly at the site of the wounds especially lateral foot wound but nothing like what she was experiencing previously. This is rated to be a 3/10 at most. She is seen with her daughter present. Patient History Information obtained from Patient. Family History Diabetes - Mother, Heart Disease - Siblings, Stroke - Siblings, No family history of Cancer, Hereditary Spherocytosis, Hypertension, Kidney Disease, Lung Disease, Seizures, Thyroid Problems, Tuberculosis. Social History Never smoker, Marital Status -  Widowed, Alcohol Use - Never, Drug Use - No History, Caffeine Use - Daily. Review of Systems (ROS) Constitutional Symptoms (General Health) The patient has no complaints or symptoms. Respiratory The patient has no complaints or symptoms. Cardiovascular Complains or has symptoms of LE edema. Psychiatric The patient has no complaints or symptoms. Objective Constitutional Well-nourished and well-hydrated in no acute distress. Vitals Time Taken: 11:22 AM, Height: 64 in, Weight: 156 lbs, BMI: 26.8, Temperature: 97.8 F, Pulse: 82 bpm, Respiratory Rate: 16 breaths/min, Blood Pressure: 148/57 mmHg. Respiratory normal breathing without difficulty. Psychiatric this patient is able to make decisions and demonstrates good insight into disease process. Alert and Oriented x 3. pleasant and cooperative. [name], [name] ([id number]) General Notes: Currently patient has no evidence of infection at this time in regard to her extremities she has less than three second Layer we feel which is excellent news as well. Overall I'm very pleased with the progress she's made she does continue to wear her compression stockings which is good news in the overall color of her foot has returned and appears much more healthy versus the cyanosis we were noting previously. She did tolerate sharp debridement of both wounds today with excellent results and post debridement there was much more granulation noted especially the right first great toe ulcer. Integumentary (Hair, Skin) Wound #6 status is Open. Original cause of wound was Gradually Appeared. The wound is located on the [employer]. The wound measures 0.2cm length x 0.3cm width x 0.1cm depth; 0.047cm^2 area and 0.005cm^3 volume. There is no tunneling or undermining noted. There is a large amount of serous drainage noted. The wound margin is distinct with the outline attached to the wound base. There is large (67-100%) red granulation within the wound  bed. There is a small (1-33%) amount of necrotic tissue within the wound bed including Eschar. Periwound temperature was noted as No Abnormality. The periwound has tenderness on palpation. Wound #7 status is Open. Original cause of wound was Gradually Appeared. The wound is located on the Right,Dorsal Foot. The wound measures 1.1cm length x 1.2cm width x 0.2cm depth; 1.037cm^2 area and 0.207cm^3 volume. There is Fat Layer (Subcutaneous Tissue) Exposed exposed. There is no tunneling or undermining noted. There is a large amount of purulent drainage noted. The wound margin is distinct with the outline attached to the wound base. There is small (1-33%) pink granulation within  the wound bed. There is a large (67-100%) amount of necrotic tissue within the wound bed including Adherent Slough. The periwound skin appearance exhibited: Induration, Maceration, Erythema. The surrounding wound skin color is noted with erythema which is circumferential. Periwound temperature was noted as No Abnormality. The periwound has tenderness on palpation. 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 There was a Skin/Subcutaneous Tissue Debridement ([id number]) debridement with total area of 0.06 sq cm performed by STONE III, Arnette Driggs E., PA-C. with the following instrument(s): Curette to remove Viable and Non-Viable tissue/material including Exudate, Fibrin/Slough, and Subcutaneous after achieving pain control using Lidocaine 4% Topical  Solution. A time out was conducted at 11:30, prior to the start of the procedure. A Minimum amount of bleeding was controlled with Pressure. The procedure was tolerated well with a pain level of 1 throughout and a pain level of 0 following the procedure. Post Debridement Measurements: 0.2cm length x 0.3cm width x 0.2cm depth; 0.009cm^3 volume. Character of Wound/Ulcer Post Debridement is improved. Severity of Tissue Post Debridement is: Fat layer exposed. Post procedure Diagnosis Wound #6: Same as Pre-Procedure [name], [name] ([id number]) Wound #7 Pre-procedure diagnosis of Wound #7 is an Arterial Insufficiency Ulcer located on the Right,Dorsal Foot .Severity of Tissue Pre Debridement is: Fat layer exposed. There was a Skin/Subcutaneous Tissue Debridement ([id number]) debridement with total area of 1.32 sq cm performed by STONE III, Humphrey Guerreiro E., PA-C. with the following instrument(s): Curette to remove Viable and Non-Viable tissue/material including Exudate, Fibrin/Slough, and Subcutaneous after achieving pain control using Lidocaine 4% Topical Solution. A time out was conducted at 11:33, prior to the start of the procedure. A Minimum amount of bleeding was controlled with Pressure. The procedure was tolerated well with a pain level of 1 throughout and a pain level of 0 following the procedure. Post Debridement Measurements: 1.1cm length x 1.2cm width x 0.3cm depth; 0.311cm^3 volume. Character of Wound/Ulcer Post Debridement is improved. Severity of Tissue Post Debridement is: Fat layer exposed. Post procedure Diagnosis Wound #7: Same as Pre-Procedure Plan Wound Cleansing: Wound #6 Right,Medial Toe Great: Clean wound with Normal Saline. Cleanse wound with mild soap and water May Shower, gently pat wound dry prior to applying new dressing. Wound #7 Right,Dorsal Foot: Clean wound with Normal Saline. Cleanse wound with mild soap and water May Shower, gently pat wound dry prior to applying new  dressing. Anesthetic (add to Medication List): Wound #6 Right,Medial Toe Great: Topical Lidocaine 4% cream applied to wound bed prior to debridement (In Clinic Only). Wound #7 Right,Dorsal Foot: Topical Lidocaine 4% cream applied to wound bed prior to debridement (In Clinic Only). Skin Barriers/Peri-Wound Care: Wound #7 Right,Dorsal Foot: Skin Prep Primary Wound Dressing: Wound #6 Right,Medial Toe Great: Prisma Ag - moisten with saline Wound #7 Right,Dorsal Foot: Prisma Ag - moisten with saline Secondary Dressing: Wound #6 Right,Medial Toe Great: Other - coverlet Wound #7 Right,Dorsal Foot: Dry Gauze Tegaderm Dressing Change Frequency: Wound #6 Right,Medial Toe Great: Change dressing every day. Wound #7 Right,Dorsal Foot: Change dressing every day. Follow-up Appointments: Wound #6 Right,Medial Toe Great: Return Appointment in  1 week. Wound #7 Right,Dorsal Foot: Return Appointment in 1 week. Edema Control: Wound #6 Right,Medial Toe Great: Elevate legs to the level of the heart and pump ankles as often as possible [name], [name] ([id number]) Wound #7 Right,Dorsal Foot: Elevate legs to the level of the heart and pump ankles as often as possible Off-Loading: Wound #6 Right,Medial Toe Great: Turn and reposition every 2 hours Wound #7 Right,Dorsal Foot: Turn and reposition every 2 hours Additional Orders / Instructions: Wound #6 Right,Medial Toe Great: Increase protein intake. Other: - Vitamin C, Zinc Wound #7 Right,Dorsal Foot: Increase protein intake. Other: - Vitamin C, Zinc The following medication(s) was prescribed: lidocaine topical 4 % cream 1 1 cream topical was prescribed at facility Currently I'm gonna recommend that we switch to the [facility] for both wounds per above. We will see were things stand in one weeks time. If anything changes or worsens in the interim patient will contact our office for additional recommendations otherwise will see her for reevaluation  in one week. I'm very pleased that she seems to be doing so well at this time. Electronic Signature(s) Signed: 12/05/2017 5:35:20 PM By: Worthy Keeler PA-C Entered By: Worthy Keeler on 12/05/2017 17:18:21 [name] ([id number]) -------------------------------------------------------------------------------- ROS/PFSH Details Patient Name: [name] Date of Service: 12/05/2017 11:15 AM Medical Record Number: [id number] Patient Account Number: [id number] Date of Birth/Sex: [birth year] (84 y.o. Female) Treating RN: Carolyne Fiscal, Debi Primary Care Provider: PATIENT, NO Other Clinician: Referring Provider: Referral, Self Treating Provider/Extender: STONE III, Xandrea Clarey Weeks in Treatment: 6 Information Obtained From Patient Wound History Do you currently have one or more open woundso Yes How many open wounds do you currently haveo 1 Approximately how long have you had your woundso 1.5 weeks How have you been treating your wound(s) until nowo bandaide Has your wound(s) ever healed and then re-openedo No Have you had any lab work done in the past montho No Have you tested positive for an antibiotic resistant organism (MRSA, VRE)o No Have you tested positive for osteomyelitis (bone infection)o No Have you had any tests for circulation on your legso Yes [facility] the test [facility] Have you had other problems associated with your woundso Swelling Cardiovascular Complaints and Symptoms: Positive for: LE edema Medical History: Positive for: Hypertension Constitutional Symptoms (General Health) Complaints and Symptoms: No Complaints or Symptoms Eyes Medical History: Positive for: Cataracts - had surgery Respiratory Complaints and Symptoms: No Complaints or Symptoms Musculoskeletal Medical History: Positive for: Osteoarthritis Oncologic Medical History: Negative for: Received Chemotherapy; Received Radiation Psychiatric [name], [name] ([id number]) Complaints and  Symptoms: No Complaints or Symptoms HBO Extended History Items Eyes: Cataracts Immunizations Pneumococcal Vaccine: Received Pneumococcal Vaccination: Yes Implantable Devices Family and Social History Cancer: No; Diabetes: Yes - Mother; Heart Disease: Yes - Siblings; Hereditary Spherocytosis: No; Hypertension: No; Kidney Disease: No; Lung Disease: No; Seizures: No; Stroke: Yes - Siblings; Thyroid Problems: No; Tuberculosis: No; Never smoker; Marital Status - Widowed; Alcohol Use: Never; Drug Use: No History; Caffeine Use: Daily; Financial Concerns: No; Food, Clothing or Shelter Needs: No; Support System Lacking: No; Transportation Concerns: No; Advanced Directives: No; Patient does not want information on Advanced Directives; Do not resuscitate: No; Living Will: No; Medical Power of Attorney: No Physician Affirmation I have reviewed and agree with the above information. Electronic Signature(s) Signed: 12/05/2017 5:35:20 PM By: Worthy Keeler PA-C Signed: 12/06/2017 4:30:35 PM By: Alric Quan Entered By: Worthy Keeler on 12/05/2017 17:10:35 [name] ([id number]) -------------------------------------------------------------------------------- SuperBill Details Patient Name: [name] Date  of Service: 12/05/2017 Medical Record Number: [id number] Patient Account Number: [id number] Date of Birth/Sex: 10-28-1918 (84 y.o. 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 I10 Essential (primary) hypertension Facility  Procedures CPT4 Code: [id number] Description: 37902 - DEB SUBQ TISSUE 20 SQ CM/< ICD-10 Diagnosis Description L97.512 Non-pressure chronic ulcer of other part of right foot with fat l Modifier: ayer exposed Quantity: 1 Physician Procedures CPT4 Code: [id number] Description: 29924 - WC PHYS SUBQ TISS 20 SQ CM ICD-10 Diagnosis Description L97.512 Non-pressure chronic ulcer of other part of right foot with fat l Modifier: ayer exposed Quantity: 1 Electronic Signature(s) Signed: 12/05/2017 5:35:20 PM By: Worthy Keeler PA-C Entered By: Worthy Keeler on 12/05/2017 17:18:33

## 2017-12-10 NOTE — Progress Notes (Signed)
ISABELLAMARIE, RANDA ([id number]) Visit Report for 12/05/2017 Arrival Information Details Patient Name: LESTER, PLATAS Date of Service: 12/05/2017 11:15 AM Medical Record Number: [id number] Patient Account Number: [id number] Date of Birth/Sex: November 11, 1918 (84 y.o. Female) Treating RN: Montey Hora Primary Care Lillie Bollig: PATIENT, NO Other Clinician: Referring Elenore Wanninger: Referral, Self Treating Yehonatan Grandison/Extender: STONE III, HOYT Weeks in Treatment: 6 Visit Information History Since Last Visit Added or deleted any medications: No Patient Arrived: Walker Any new allergies or adverse reactions: No Arrival Time: 11:21 Had a fall or experienced change in No Accompanied By: dtr activities of daily living that may affect Transfer Assistance: None risk of falls: Patient Identification Verified: Yes Signs or symptoms of abuse/neglect since last visito No Secondary Verification Process Completed: Yes Hospitalized since last visit: No Patient Requires Transmission-Based Precautions: No Has Dressing in Place as Prescribed: Yes Patient Has Alerts: No Pain Present Now: No Electronic Signature(s) Signed: 12/05/2017 4:46:41 PM By: Montey Hora Entered By: Montey Hora on 12/05/2017 11:21:44 [name] ([id number]) -------------------------------------------------------------------------------- Encounter Discharge Information Details Patient Name: [name] Date of Service: 12/05/2017 11:15 AM Medical Record Number: [id number] Patient Account Number: [id number] Date of Birth/Sex: 11/04/1918 (84 y.o. Female) Treating RN: Roger Shelter Primary Care Dovid Bartko: PATIENT, NO Other Clinician: Referring Fancy Dunkley: Referral, Self Treating Lynard Postlewait/Extender: STONE III, HOYT Weeks in Treatment: 6 Encounter Discharge Information Items Discharge Pain Level: 0 Discharge Condition: Stable Ambulatory Status: Walker Discharge Destination: Home Private Transportation: Auto Accompanied By:  self Schedule Follow-up Appointment: Yes Medication Reconciliation completed and provided No to Patient/Care Tyshay Adee: Clinical Summary of Care: Electronic Signature(s) Signed: 12/08/2017 5:56:09 PM By: Roger Shelter Entered By: Roger Shelter on 12/05/2017 11:52:16 [name] ([id number]) -------------------------------------------------------------------------------- Lower Extremity Assessment Details Patient Name: [name] Date of Service: 12/05/2017 11:15 AM Medical Record Number: [id number] Patient Account Number: [id number] Date of Birth/Sex: October 23, 1918 (84 y.o. Female) Treating RN: Montey Hora Primary Care Gleen Ripberger: PATIENT, NO Other Clinician: Referring Makaya Juneau: Referral, Self Treating Jeremias Broyhill/Extender: STONE III, HOYT Weeks in Treatment: 6 Vascular Assessment Claudication: Claudication Assessment [Right:Rest Pain] Pulses: Dorsalis Pedis Palpable: [Right:Yes] Posterior Tibial Extremity colors, hair growth, and conditions: Extremity Color: [Right:Hyperpigmented] Hair Growth on Extremity: [Right:No] Temperature of Extremity: [Right:Warm] Capillary Refill: [Right:< 3 seconds] Electronic Signature(s) Signed: 12/05/2017 4:46:41 PM By: Montey Hora Entered By: Montey Hora on 12/05/2017 11:30:05 [name] ([id number]) -------------------------------------------------------------------------------- Multi Wound Chart Details Patient Name: [name] Date of Service: 12/05/2017 11:15 AM Medical Record Number: [id number] Patient Account Number: [id number] Date of Birth/Sex: Aug 20, 1919 (84 y.o. 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] [7:Arterial Insufficiency Ulcer] [N/A:N/A] Comorbid History: [6:Cataracts, Hypertension, Osteoarthritis] [7:Cataracts, Hypertension, Osteoarthritis] [N/A:N/A] Date Acquired: [6:10/16/2017] [7:10/11/2017] [N/A:N/A] Weeks of Treatment: [6:6] [7:6] [N/A:N/A] Wound Status: [6:Open] [7:Open] [N/A:N/A] Measurements L x W x D [6:0.2x0.3x0.1] [7:1.1x1.2x0.2] [N/A:N/A] (cm) Area (cm) : [6:0.047] [7:1.037] [N/A:N/A] Volume (cm) : [6:0.005] [7:0.207] [N/A:N/A] % Reduction in Area: [6:70.10%] [7:-83.50%] [N/A:N/A] % Reduction in Volume: [6:68.80%] [7:-83.20%] [N/A:N/A] Classification: [6:Partial Thickness] [7:Partial Thickness] [N/A:N/A] Exudate Amount: [6:Large] [7:Large] [N/A:N/A] Exudate Type: [6:Serous] [7:Purulent] [N/A:N/A] Exudate Color: [6:amber] [7:yellow, brown, green] [N/A:N/A] Wound Margin: [6:Distinct, outline attached] [7:Distinct, outline attached] [N/A:N/A] Granulation Amount: [6:Large (  67-100%)] [7:Small (1-33%)] [N/A:N/A] Granulation Quality: [6:Red] [7:Pink] [N/A:N/A] Necrotic Amount: [6:Small (1-33%)] [7:Large (67-100%)] [N/A:N/A] Necrotic Tissue: [6:Eschar] [7:Adherent Slough] [N/A:N/A] Exposed Structures: [6:Fascia: No Fat Layer (Subcutaneous Tissue) Exposed: No Tendon: No Muscle: No Joint: No Bone: No] [7:Fat Layer (Subcutaneous Tissue) Exposed: Yes Fascia: No Tendon: No Muscle: No Joint: No Bone: No] [N/A:N/A] Epithelialization: [6:Medium (34-66%)] [7:None] [N/A:N/A] Periwound Skin Texture: [6:No Abnormalities Noted] [7:Induration: Yes] [N/A:N/A] Periwound Skin Moisture: [6:No Abnormalities Noted] [7:Maceration: Yes] [N/A:N/A] Periwound Skin Color: [6:No Abnormalities Noted] [7:Erythema: Yes]  [N/A:N/A] Erythema Location: [6:N/A] [7:Circumferential] [N/A:N/A] Temperature: [6:No Abnormality] [7:No Abnormality] [N/A:N/A] Tenderness on Palpation: Yes Yes N/A Wound Preparation: Ulcer Cleansing: Ulcer Cleansing: N/A Rinsed/Irrigated with Saline Rinsed/Irrigated with Saline Topical Anesthetic Applied: Topical Anesthetic Applied: Other: lidocaine 4% Other: lidocaine 4% Treatment Notes Electronic Signature(s) Signed: 12/06/2017 4:30:35 PM By: Alric Quan Entered By: Alric Quan on 12/05/2017 11:37:24 [name] ([id number]) -------------------------------------------------------------------------------- Multi-Disciplinary Care Plan Details Patient Name: [name] Date of Service: 12/05/2017 11:15 AM Medical Record Number: [id number] Patient Account Number: [id number] Date of Birth/Sex: Feb 25, 1919 (84 y.o. Female) Treating RN: Carolyne Fiscal, Debi Primary Care Esme Durkin: PATIENT, NO Other Clinician: Referring Garvis Downum: Referral, Self Treating Cyris Maalouf/Extender: STONE III, HOYT Weeks in Treatment: 6 Active Inactive ` Abuse / Safety / Falls / Self Care Management Nursing Diagnoses: History of Falls Potential for falls Goals: Patient will not experience any injury related to falls Date Initiated: 10/23/2017 Target Resolution Date: 02/17/2018 Goal Status: Active Interventions: Assess Activities of Daily Living upon admission and as needed Assess fall risk on admission and as needed Assess: immobility, friction, shearing, incontinence upon admission and as needed Assess impairment of mobility on admission and as needed per policy Assess personal safety and home safety (as indicated) on admission and as needed Assess self care needs on admission and as needed Notes: ` Nutrition Nursing Diagnoses: Imbalanced nutrition Potential for alteratiion in Nutrition/Potential for imbalanced nutrition Goals: Patient/caregiver agrees to and verbalizes understanding of  need to use nutritional supplements and/or vitamins as prescribed Date Initiated: 10/23/2017 Target Resolution Date: 02/17/2018 Goal Status: Active Interventions: Assess patient nutrition upon admission and as needed per policy Notes: ` Orientation to the Wound Care Program Nursing Diagnoses: JERMISHA, HOFFART ([id number]) Knowledge deficit related to the wound healing center program Goals: Patient/caregiver will verbalize understanding of the [place] [facility] Date Initiated: 10/23/2017 Target Resolution Date: 11/18/2017 Goal Status: Active Interventions: Provide education on orientation to the wound center Notes: ` Pain, Acute or Chronic Nursing Diagnoses: Pain, acute or chronic: actual or potential Potential alteration in comfort, pain Goals: Patient/caregiver will verbalize adequate pain control between visits Date Initiated: 10/23/2017 Target Resolution Date: 02/17/2018 Goal Status: Active Interventions: Complete pain assessment as per visit requirements Notes: ` Wound/Skin Impairment Nursing Diagnoses: Impaired tissue integrity Knowledge deficit related to ulceration/compromised skin integrity Goals: Ulcer/skin breakdown will have a volume reduction of 80% by week 12 Date Initiated: 10/23/2017 Target Resolution Date: 02/17/2018 Goal Status: Active Interventions: Assess patient/caregiver ability to perform ulcer/skin care regimen upon admission and as needed Assess ulceration(s) every visit Notes: Electronic Signature(s) Signed: 12/06/2017 4:30:35 PM By: Alric Quan Entered By: Alric Quan on 12/05/2017 11:37:15 [name] ([id number]) -------------------------------------------------------------------------------- Pain Assessment Details Patient Name: [name] Date of Service: 12/05/2017 11:15 AM Medical Record Number: [id number] Patient Account Number: [id number] Date of Birth/Sex: June 15, 1919 (84 y.o. Female) Treating RN: Montey Hora Primary Care Kileen Lange: PATIENT, NO Other Clinician: Referring Donzella Carrol: Referral, Self Treating Maeve Debord/Extender: STONE III, HOYT Weeks in Treatment: 6 Active Problems Location of Pain Severity and  Description of Pain Patient Has Paino Yes Site Locations Pain Location: Pain in Ulcers With Dressing Change: Yes Duration of the Pain. Constant / Intermittento Intermittent Pain Management and Medication Current Pain Management: Electronic Signature(s) Signed: 12/05/2017 4:46:41 PM By: Montey Hora Entered By: Montey Hora on 12/05/2017 11:22:24 [name] ([id number]) -------------------------------------------------------------------------------- Patient/Caregiver Education Details Patient Name: [name] Date of Service: 12/05/2017 11:15 AM Medical Record Number: [id number] Patient Account Number: [id number] Date of Birth/Gender: 1919/07/22 (84 y.o. Female) Treating RN: Roger Shelter Primary Care Physician: PATIENT, NO Other Clinician: Referring Physician: Referral, Self Treating Physician/Extender: Melburn Hake, HOYT Weeks in Treatment: 6 Education Assessment Education Provided To: Patient Education Topics Provided Wound Debridement: Methods: Explain/Verbal Responses: State content correctly Wound/Skin Impairment: Handouts: Caring for Your Ulcer Methods: Explain/Verbal Responses: State content correctly Electronic Signature(s) Signed: 12/08/2017 5:56:09 PM By: Roger Shelter Entered By: Roger Shelter on 12/05/2017 11:52:35 [name] ([id number]) -------------------------------------------------------------------------------- Wound Assessment Details Patient Name: [name] Date of Service: 12/05/2017 11:15 AM Medical Record Number: [id number] Patient Account Number: [id number] Date of Birth/Sex: 1919/01/26 (84 y.o. Female) Treating RN: Montey Hora Primary Care Jaelynne Hockley: PATIENT, NO Other Clinician: Referring Koi Yarbro: Referral,  Self Treating Simmone Cape/Extender: STONE III, HOYT Weeks in Treatment: 6 Wound Status Wound Number: 6 Primary Etiology: Arterial Insufficiency Ulcer Wound Location: Right Toe Great - Medial Wound Status: Open Wounding Event: Gradually Appeared Comorbid History: Cataracts, Hypertension, Osteoarthritis Date Acquired: 10/16/2017 Weeks Of Treatment: 6 Clustered Wound: No Photos Photo Uploaded By: Montey Hora on 12/05/2017 11:38:37 Wound Measurements Length: (cm) 0.2 Width: (cm) 0.3 Depth: (cm) 0.1 Area: (cm) 0.047 Volume: (cm) 0.005 % Reduction in Area: 70.1% % Reduction in Volume: 68.8% Epithelialization: Medium (34-66%) Tunneling: No Undermining: No Wound Description Classification: Partial Thickness Wound Margin: Distinct, outline attached Exudate Amount: Large Exudate Type: Serous Exudate Color: amber Foul Odor After Cleansing: No Slough/Fibrino No Wound Bed Granulation Amount: Large (67-100%) Exposed Structure Granulation Quality: Red Fascia Exposed: No Necrotic Amount: Small (1-33%) Fat Layer (Subcutaneous Tissue) Exposed: No Necrotic Quality: Eschar Tendon Exposed: No Muscle Exposed: No Joint Exposed: No Bone Exposed: No Periwound Skin Texture Digirolamo, Candice ([id number]) Texture Color No Abnormalities Noted: No No Abnormalities Noted: No Moisture Temperature / Pain No Abnormalities Noted: No Temperature: No Abnormality Tenderness on Palpation: Yes Wound Preparation Ulcer Cleansing: Rinsed/Irrigated with Saline Topical Anesthetic Applied: Other: lidocaine 4%, Treatment Notes Wound #6 (Right, Medial Toe Great) 1. Cleansed with: Clean wound with Normal Saline 2. Anesthetic Topical Lidocaine 4% cream to wound bed prior to debridement 4. Dressing Applied: Prisma Ag 5. Secondary Dressing Applied Dry Gauze Notes great toe-bandaid top of foot - gauze and tegaderm Electronic Signature(s) Signed: 12/05/2017 4:46:41 PM By: Montey Hora Entered By:  Montey Hora on 12/05/2017 11:29:10 [name] ([id number]) -------------------------------------------------------------------------------- Wound Assessment Details Patient Name: [name] Date of Service: 12/05/2017 11:15 AM Medical Record Number: [id number] Patient Account Number: [id number] Date of Birth/Sex: May 06, 1919 (84 y.o. Female) Treating RN: Montey Hora Primary Care Jossalyn Forgione: PATIENT, NO Other Clinician: Referring Ajanee Buren: Referral, Self Treating Audery Wassenaar/Extender: STONE III, HOYT Weeks in Treatment: 6 Wound Status Wound Number: 7 Primary Etiology: Arterial Insufficiency Ulcer Wound Location: Right Foot - Dorsal Wound Status: Open Wounding Event: Gradually Appeared Comorbid History: Cataracts, Hypertension, Osteoarthritis Date Acquired: 10/11/2017 Weeks Of Treatment: 6 Clustered Wound: No Photos Photo Uploaded By: Montey Hora on 12/05/2017 11:38:38 Wound Measurements Length: (cm) 1.1 Width: (cm) 1.2 Depth: (cm) 0.2 Area: (cm) 1.037 Volume: (cm) 0.207 % Reduction in Area: -83.5% % Reduction in Volume: -83.2% Epithelialization: None Tunneling: No Undermining:  No Wound Description Classification: Partial Thickness Wound Margin: Distinct, outline attached Exudate Amount: Large Exudate Type: Purulent Exudate Color: yellow, brown, green Foul Odor After Cleansing: No Slough/Fibrino Yes Wound Bed Granulation Amount: Small (1-33%) Exposed Structure Granulation Quality: Pink Fascia Exposed: No Necrotic Amount: Large (67-100%) Fat Layer (Subcutaneous Tissue) Exposed: Yes Necrotic Quality: Adherent Slough Tendon Exposed: No Muscle Exposed: No Joint Exposed: No Bone Exposed: No Periwound Skin Texture Guhl, Amira ([id number]) Texture Color No Abnormalities Noted: No No Abnormalities Noted: No Induration: Yes Erythema: Yes Erythema Location: Circumferential Moisture No Abnormalities Noted: No Temperature / Pain Maceration:  Yes Temperature: No Abnormality Tenderness on Palpation: Yes Wound Preparation Ulcer Cleansing: Rinsed/Irrigated with Saline Topical Anesthetic Applied: Other: lidocaine 4%, Treatment Notes Wound #7 (Right, Dorsal Foot) 1. Cleansed with: Clean wound with Normal Saline 2. Anesthetic Topical Lidocaine 4% cream to wound bed prior to debridement 4. Dressing Applied: Prisma Ag 5. Secondary Dressing Applied Dry Gauze Notes great toe-bandaid top of foot - gauze and tegaderm Electronic Signature(s) Signed: 12/05/2017 4:46:41 PM By: Montey Hora Entered By: Montey Hora on 12/05/2017 11:29:25 [name] ([id number]) -------------------------------------------------------------------------------- [place] Details Patient Name: [name] Date of Service: 12/05/2017 11:15 AM Medical Record Number: [id number] Patient Account Number: [id number] Date of Birth/Sex: Aug 21, 1919 (84 y.o. Female) Treating RN: Montey Hora Primary Care Vick Filter: PATIENT, NO Other Clinician: Referring Dagoberto Nealy: Referral, Self Treating Kaetlin Bullen/Extender: STONE III, HOYT Weeks in Treatment: 6 Vital Signs Time Taken: 11:22 Temperature (F): 97.8 Height (in): 64 Pulse (bpm): 82 Weight (lbs): 156 Respiratory Rate (breaths/min): 16 Body Mass Index (BMI): 26.8 Blood Pressure (mmHg): 148/57 Reference Range: 80 - 120 mg / dl Electronic Signature(s) Signed: 12/05/2017 4:46:41 PM By: Montey Hora Entered By: Montey Hora on 12/05/2017 11:24:15

## 2017-12-12 ENCOUNTER — Ambulatory Visit (INDEPENDENT_AMBULATORY_CARE_PROVIDER_SITE_OTHER): Payer: Medicare PPO

## 2017-12-12 ENCOUNTER — Encounter: Payer: Medicare PPO | Attending: Physician Assistant | Admitting: Physician Assistant

## 2017-12-12 DIAGNOSIS — Z7902 Long term (current) use of antithrombotics/antiplatelets: Secondary | ICD-10-CM | POA: Diagnosis not present

## 2017-12-12 DIAGNOSIS — I771 Stricture of artery: Secondary | ICD-10-CM | POA: Insufficient documentation

## 2017-12-12 DIAGNOSIS — Z8249 Family history of ischemic heart disease and other diseases of the circulatory system: Secondary | ICD-10-CM | POA: Diagnosis not present

## 2017-12-12 DIAGNOSIS — M1990 Unspecified osteoarthritis, unspecified site: Secondary | ICD-10-CM | POA: Insufficient documentation

## 2017-12-12 DIAGNOSIS — I739 Peripheral vascular disease, unspecified: Secondary | ICD-10-CM

## 2017-12-12 DIAGNOSIS — L97512 Non-pressure chronic ulcer of other part of right foot with fat layer exposed: Secondary | ICD-10-CM | POA: Insufficient documentation

## 2017-12-12 DIAGNOSIS — Z885 Allergy status to narcotic agent status: Secondary | ICD-10-CM | POA: Diagnosis not present

## 2017-12-12 DIAGNOSIS — Z87442 Personal history of urinary calculi: Secondary | ICD-10-CM | POA: Diagnosis not present

## 2017-12-12 DIAGNOSIS — I10 Essential (primary) hypertension: Secondary | ICD-10-CM | POA: Insufficient documentation

## 2017-12-12 DIAGNOSIS — E039 Hypothyroidism, unspecified: Secondary | ICD-10-CM | POA: Diagnosis not present

## 2017-12-12 DIAGNOSIS — Z823 Family history of stroke: Secondary | ICD-10-CM | POA: Diagnosis not present

## 2017-12-12 DIAGNOSIS — Z79899 Other long term (current) drug therapy: Secondary | ICD-10-CM | POA: Insufficient documentation

## 2017-12-14 NOTE — Progress Notes (Signed)
[name], [name] ([id number]) Visit Report for 12/12/2017 Chief Complaint Document Details Patient Name: [name], [name] Date of Service: 12/12/2017 11:15 AM Medical Record Number: [id number] Patient Account Number: [id number] Date of Birth/Sex: 03-04-1919 (84 y.o. Female) Treating RN: Carolyne Fiscal, Debi Primary Care Provider: PATIENT, NO Other Clinician: Referring Provider: Referral, Self Treating Provider/Extender: STONE III, Chennel Olivos Weeks in Treatment: 7 Information Obtained from: Patient Chief Complaint Right foot Ulcers Electronic Signature(s) Signed: 12/12/2017 7:21:18 PM By: Worthy Keeler PA-C Entered By: Worthy Keeler on 12/12/2017 11:37:28 [name] ([id number]) -------------------------------------------------------------------------------- Debridement Details Patient Name: [name] Date of Service: 12/12/2017 11:15 AM Medical Record Number: [id number] Patient Account Number: [id number] Date of Birth/Sex: 1919/04/29 (84 y.o. Female) Treating RN: Carolyne Fiscal, Debi Primary Care Provider: PATIENT, NO Other Clinician: Referring Provider: Referral, Self Treating Provider/Extender: STONE III, Ayn Domangue Weeks in Treatment: 7 Debridement Performed for Wound #7 Right,Dorsal Foot Assessment: Performed By: Physician STONE III, Adelene Polivka E., PA-C Debridement: Debridement Severity of Tissue Pre Fat layer exposed Debridement: Pre-procedure Verification/Time Yes - 11:40 Out Taken: Start Time: 11:41 Pain Control: Lidocaine 4% Topical Solution Level: Skin/Subcutaneous Tissue Total Area Debrided (L x W): 1 (cm) x 1.1 (cm) = 1.1 (cm) Tissue and other material Viable, Exudate, Fibrin/Slough, Subcutaneous debrided: Instrument: Curette Bleeding: Minimum Hemostasis Achieved: Pressure End Time: 11:42 Procedural Pain: 0 Post Procedural Pain: 0 Response to Treatment: Procedure was tolerated well Post Debridement Measurements of Total Wound Length: (cm) 1 Width: (cm) 1.1 Depth: (cm)  0.3 Volume: (cm) 0.259 Character of Wound/Ulcer Post Debridement: Requires Further Debridement Severity of Tissue Post Debridement: Fat layer exposed Post Procedure Diagnosis Same as Pre-procedure Electronic Signature(s) Signed: 12/12/2017 7:21:18 PM By: Worthy Keeler PA-C Signed: 12/13/2017 4:15:20 PM By: Alric Quan Entered By: Alric Quan on 12/12/2017 11:42:21 [name] ([id number]) -------------------------------------------------------------------------------- Debridement Details Patient Name: [name] Date of Service: 12/12/2017 11:15 AM Medical Record Number: [id number] Patient Account Number: [id number] Date of Birth/Sex: 09-10-1919 (84 y.o. Female) Treating RN: Carolyne Fiscal, Debi Primary Care Provider: PATIENT, NO Other Clinician: Referring Provider: Referral, Self Treating Provider/Extender: STONE III, Dorismar Chay Weeks in Treatment: 7 Debridement Performed for Wound #6 Right,Medial Toe Great Assessment: Performed By: Physician STONE III, Makylie Rivere E., PA-C Debridement: Debridement Severity of Tissue Pre Fat layer exposed Debridement: Pre-procedure Verification/Time Yes - 11:40 Out Taken: Start Time: 11:43 Pain Control: Lidocaine 4% Topical Solution Level: Skin/Subcutaneous Tissue Total Area Debrided (L x W): 0.2 (cm) x 0.3 (cm) = 0.06 (cm) Tissue and other material Viable, Non-Viable, Exudate, Fibrin/Slough, Subcutaneous debrided: Instrument: Curette Bleeding: Minimum Hemostasis Achieved: Pressure End Time: 11:46 Procedural Pain: 0 Post Procedural Pain: 0 Response to Treatment: Procedure was tolerated well Post Debridement Measurements of Total Wound Length: (cm) 0.3 Width: (cm) 0.2 Depth: (cm) 0.2 Volume: (cm) 0.009 Character of Wound/Ulcer Post Debridement: Requires Further Debridement Severity of Tissue Post Debridement: Fat layer exposed Post Procedure Diagnosis Same as Pre-procedure Electronic Signature(s) Signed: 12/12/2017 7:21:18 PM  By: Worthy Keeler PA-C Signed: 12/13/2017 4:15:20 PM By: Alric Quan Entered By: Alric Quan on 12/12/2017 11:46:58 [name] ([id number]) -------------------------------------------------------------------------------- HPI Details Patient Name: [name] Date of Service: 12/12/2017 11:15 AM Medical Record Number: [id number] Patient Account Number: [id number] Date of Birth/Sex: 12/30/1918 (84 y.o. Female) Treating RN: Carolyne Fiscal, Debi Primary Care Provider: PATIENT, NO Other Clinician: Referring Provider: Referral, Self Treating Provider/Extender: STONE III, Azaylia Fong Weeks in Treatment: 7 History of Present Illness HPI Description: 04/06/16; this is a 84 year old woman who arrives accompanied by 2 daughters for a wound on her left ankle and her left fifth  toe. These have apparently been present for a year. I'm not quite certain how she came to this clinic however she was being followed by Sharlotte Alamo her podiatrist for these wounds. She was also referred to [facility] and they apparently did a test presumably arterial studies although we don't have any of these results and we couldn't get through to the office today. The family but has been applying a combination of Bactroban and a light bandage and perhaps more recently Silvadene cream. She did have an x-ray of the foot roughly 6 months ago at the podiatry office the family was unaware that if there were any abnormalities. Apparently they have not seen any healing here. Our intake nurse noted a slight skin tear on the right anterior lower leg. Nobody seemed aware of this. ABIs calculated in this clinic was 0.3 on the right and 0.4 on the left I have reviewed things in [facility]. There is very little information on this patient. She apparently follows in current total clinic which we don't have information from. She has mentioned already been to a [facility]. She has a history of hypothyroidism, nephrolithiasis  arthritis and has had a previous mastectomy. 04/13/16; patient's x-ray was normal. She is already been to see Dr. Delana Meyer vascular surgery. Her arterial exam was from November 2016 this showed a left ABI of 0.62 her right of 0.76. Her duplex ultrasound of the left leg showed biphasic waves in the common femoral and distal femoral artery however monophasic waves in the superficial femoral artery proximal and mid biphasic it distal. 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 The patient tells me that she has pain at night when she tries to lie down which is improved by getting up and sitting in the chair this sounds like claudication at rest. Her wounds are on the left medial malleolus and the dorsal fifth toe small punched out wounds that are right on bone. We use Santyl last week 04/20/16: nurse informed me pt has declined evaluation for significant PAD. she denies systemic s/s of infections. 04/27/16;; the patient has had noninvasive arterial studies done in November 2016. ABI and the left was 0.62. Monophasic waves at the superficial femoral artery. Occluded to the posterior tibial artery. So had greater than 50% stenosis of the right superficial femoral and greater than 50% stenosis of the left superficial femoral artery. She had bilateral tibial peroneal artery disease. Both of the wounds on the left fifth toe and left lateral malleolus have been present for more than a year. They have been to see vein and vascular. The patient has some pain but miraculously I think the wounds have largely been stable. No evidence of infection 05/04/16; she goes for a noninvasive study tomorrow and then sees Dr. Fletcher Anon on Monday. By the time she is here next week we should have a better picture of whether something can be done with regards to her arterial flow. We continue to have ischemic-looking wounds on the left fifth toe and left lateral malleolus. 05/10/16; the patient went for her arterial studies and saw Dr.  Fletcher Anon. As predicted she is felt to have critical limb ischemia. The feeling is that she has occlusion of the SFA. The feeling would she would be a candidate for a stent to the SFA. The patient did not make a decision to proceed with the procedure and she is here with family members to discuss this me today. He shouldn't has a lot of pain and cannot sleep and  rest well at night per her family. 05/24/16; the patient went and had a complex revascularization/angioplasty of the left superficial femoral artery followed by drug-coated balloon angioplasty and spots stenting. She tolerated the procedure well. She was recommended for dual antiplatelet drugs with Plavix and aspirin for at least a month. She went yesterday for I believe follow-up serial Dopplers and ABIs although I don't see these results. The patient is unfortunately complaining of a lot of pain in her bilateral lower legs below the knees from the ankle to the knees. She apparently was prescribed lidocaine and apparently put this on her legs instead of over the wound areas. This may have something to do with it however there is a lot of edema in her bilateral legs I was able to find her arterial studies from yesterday. The left ABI has improved up to 0.66 post left SFA stent. The bilateral great toe indices remain abnormal with the left being in the 0.25 range. Duplex ultrasound showed her left SFA stent is patent monophasic waveforms persist in the left leg 06/07/16; continued punched-out areas over the dorsal left fifth toe and left lateral malleolus. No major improvement 06/28/16; the areas over her dorsal left fifth toe and left lateral malleolus are covered in surface slough we are using Iodoflex 07/05/16. We have been using Iodoflex for 2-3 weeks now. I have not been debridement is because of pain [name], [name] ([id number]) 07/19/16 currently we have been using Iodoflex for roughly the past month. Previously we were unable to debride due  to pain although today patient states that the pain is not nearly as severe as it has been in the past. In fact she rates this to be a 1 out of 10 and it worse to a to 10 with palpation of the wound. All and all her and her daughter feel like this is actually doing steadily better at this point in time. She is pleased with progress and we have been seeing her every 2 weeks. 08/02/16; this is a delightful 84 year old woman I have not seen in over a month. She has arterial insufficiency wounds remaining over the left lateral malleolus and the left fifth toe. With the help of Dr. Fletcher Anon we are able to get her revascularized on the left. The 2 wounds on the left have been making good progress and are definitely smaller especially the area over the left lateral malleolus. She is certainly in a lot less pain than she used to be although I still think there is some claudication type pain. Unfortunately she is developed a area on the right lateral foot which is a small open area but I think is threatened. I suspect a small ischemic areas well. Also on her right fifth toe there is an area that is not open but looks as though it is receiving too much pressure from footwear. Finally an area over her right malleolus although I don't think this is on its way to anything ominous 08/17/16 patient presents today for follow up evaluation she tells me that she is really doing fairly well from a pain standpoint compared to where she has been previous. She is tolerating the dressing changes without any complication. She continues to have discharge and drainage however. 08-30-16 Ms. Zagami presents today with her daughter, she states that she continues to have intermittent shooting pains to the left lateral fifth toe at this site of seems to be a healed ulcer. She denies any other issues that her wound related since her  last appointment. Her daughter states that she is in need of a tramadol refill at this time as she  uses half a tablet at at bedtime to aid in sleep due to foot pain. Iodoflex has been used on all wounds in previous dressing changes. 09/13/16; the patient has ischemic wounds in her feet. The area over the left lateral malleolus is healed and the area over the left fifth toe looks improved.. She has an area on the lateral aspect of her right foot which is a small but deep wound. Finally she has a new open area on the medial aspect of the left medial malleolus. This is superficial 09/27/16; the area over the left lateral malleolus remains healed. The area over the left fifth toe has a surface and she states the pain is better but I don't think this is completely closed. She has an area on the lateral aspect of her right foot is a small but deep wound. The new open area on the medial aspect of the left ankle was closed from last time. 10/18/16; the area over her left lateral malleolus remains healed. The area over the left fifth toe has a surface over the top of this however there is no overt open area. Given the underlying issues of severe PAD and continued pain in the toe I would think it would be unlikely this is truly healed however I am not planning to debridement this area. The area on the right lateral foot which was a more recent wound is a small punched out painful area again has significant surface slough and nonviable tissue. It is clear the patient still has claudication type pain however she remains functional. I have been giving her tramadol when necessary and that seems to help a lot with her pain the patient follows up with Dr. Fletcher Anon on January 18/18 11/01/16; patient missed her follow-up with Dr. Fletcher Anon last week due to a snow day. Follow-up is now on February 15. The areas on the right lateral malleolus and dorsal right fifth toe remain closed over. The fifth toe was tentative is there is a surface eschar however I'm not going to disturb this. Therefore, her only open areas on the right  lateral foot. This is a small punched out area. We have been using Prisma. I'm quite convinced this is an ischemic wound 11/15/16; patient has follow-up with Dr. Fletcher Anon on February 15. She has no open wound on the left leg and doesn't really complain of claudication that I can determine from talking to her. However on the right leg she clearly has some degree of claudication. The remaining open wound is on the right lateral foot. We have been using Iodosorb ointment 11/29/16; patient saw Dr. Fletcher Anon on 11/24/16. His comment is that her wound on her right lateral foot seems to be improving with local wound care. She has known significant right SFA disease. Her lower extremity Doppler will be repeated and according to the patient's daughter that appointment is on March 15. He is left with the thought that endovascular intervention in the right SFA might be necessary. The patient has quite a bit of pain especially at night related to the wound in her right foot. We changed her to Iodosorb ointment last week 12/13/16; this is a patient I follow every 2 weeks largely on a palliative approach at this point about ischemic wounds currently in the right foot. Initially she had them on her left lateral malleolus and left fifth toe. The area on  the left lateral malleolus healed and the fifth toe has a surface eschar that I have elected not to remove. Both of these improved after revascularization by Dr. Fletcher Anon. We have been using Iodosorb to the right lateral foot not much change here. 12/27/16; the patient had her arterial studies. This showed known bilateral SFA disease. Stable right ABI 0.5 to stable left ABI at 0.7 to the did not do it TBI on the right it was 0.61 on the left she has a stent in the long segment of her left SFA from 05/18/16. All of her wounds are somewhat better. She states her pain is better in the right foot is improved. 01/10/17- patient is here for follow-up dilation of her right lateral foot ulcer.  She has been tolerating Iodosorb. She is voices no complaints or concerns 01/24/17; small ischemic wound on right lateral foot. still non viable cover. follows with Dr. Fletcher Anon on 4/30. No open area on left foot General, [name] ([id number]) 02/07/17 doing well and states she is pain free. Saw Dr. Fletcher Anon who said she is doing well and has "50% flow in the right leg and 70% in the left. According to her daughter he does not wish to do any further interventions 02/21/17; small ischemic wound on the right lateral foot. Per her daughter and the patient she has no open wound on the left foot and ankle which were her initial presenting wounds. still has claudication type pain at night she is been to see interventional cardiology who does not feel that she has a need for intervention on the right leg at present. She still has claudication type pain in the right leg which she manages at night with when necessary tramadol that I have prescribed 03/14/17; small ischemic wound on the right lateral foot. They've been dressing this at home with Iodosorb ointment. The patient does not complain of any pain. The wound has a surface eschar over it and there is really no visible open area. This is similar to how the areas on the left leg healed 04/11/17; the small ischemic wound on her right lateral foot is finally closed over. Small amount of eschar over the surface however I did not attempt to remove this. The patient claims to be asymptomatic she is not having any claudication that I'm able to elicit although her activity is limited Readmission: 10/23/17 on evaluation today patient appears to be doing somewhat poorly in regard to her right foot where she has two ulcers at this point. The worst is on the lateral aspect of her foot medial first metatarsal region is not nearly as bad. With that being said these did arise seemingly out of nowhere she has previously had a similar issue in the past these have always  been ischemic wounds due to arterial insufficiency. She does see Dr. Fletcher Anon as her vascular specialist and it has been noted that her ABI's are somewhat low. She actually has a repeat evaluation coming up this March 2019 to reevaluate her blood flow. On the last check 12/22/16 it was noted that patient had stable ABI's in the lower moderate abnormal range in regard to the right in regard to the left and the upper moderate at normal range. Patient is having some discomfort though nothing severe at this point in time. She is seen with her daughter and son-in-law today. No fevers, chills, nausea, or vomiting noted at this time. 10/30/17 on evaluation today patient appears to be doing well in regard to her wounds. The  right lateral foot wound appears to show signs of cleaning up nicely there is granulation noted underneath that the bed of the wound although there still some [facility] covering. Nonetheless she still has a lot of discomfort and I really do not want to proceed with debridement due to the fact that she does have so much discomfort. Good news is the tramadol has been helping her at bedtime she only takes this once a day and that has been extremely beneficial. Unfortunately she does not have a primary right now as she is awaiting a new one which is the reason that I am prescribing the tramadol. Nonetheless I'm pleased with how things seem to be progressing in regard to her ulcers. 11/06/17 on evaluation today patient appears to be doing decently well as far as maintaining in regard to her ulcers. Unfortunately I did review her arterial study which shows that she has on the right arterial obstruction involving the superficial femoral and popliteal artery as well is the superficial femoral artery with a high grade stenosis versus inclusion. Collateral flow noted in the distal portion of the SFA. Severe progression is noted compared to previous study. Unfortunately being the patient's wounds do not  seem to be healing as appropriately and quickly as we would like I do believe this is likely a result of poor vascular flow to the right lower extremity. This was discussed with patient and her daughter during the office visit today. 11/14/17 on evaluation today patient continues to have a fairly stalled ulcer in regard to her to ulcers on the right lower extremity. She obviously did have significant findings on her arterial study and I do believe this is again due to poor vascular flow. She does have an appointment later this afternoon with her vascular specialist for further evaluation to see if there any recommendations at that point. With that being said she continues to have discomfort which I do believe is arterial in nature in regard to insufficiency. 11/21/17 on evaluation today patient appears to be doing fairly well in regard to the appearance of her wounds other than the fact that she does have erythema surrounding the wound bed at this point in fact this encompasses the majority of the dorsal surface of her right foot. There is additional granulation noted today that was not noted previously on evaluation and again if that were alone were the findings that we were seeing I would be very happy with the current progress. However unfortunately she is having the erythema which concerns me for the possibility of infection although the other possibility is that she could just be having a local reaction due to the I resort. In the past she never had this issue however when she use this for the root left foot which makes me again come back to my concern about infection. 11/28/17 on evaluation today patient is status post having had an angiogram performed on 11/22/17 where it was noted that she had severe disease involving the TP trunk and proximal peroneal artery. Subsequently she underwent successful atherectomy and drug coated balloon angioplasty to the right SFA, TP trunk, and proximal peroneal  artery. Patient stated only of has 10% residual stenosis and overall the procedure at the time was both tolerated well as well as considered a success. This was performed by Dr. Sophronia Simas. Patient was subsequently discharged home on 11/23/17 from the hospital. Overall she states that her leg pain is not nearly as severe as what it was previous which is excellent  news. She also has been sleeping with her [name], [name] ([id number]) leg in the bed previously she was hanging this over the edge due to the pain. She also seems to have much better blood flow on evaluation today. 12/05/17 on evaluation today patient appears to be doing very well in regard to her quite lateral great toe as well as the right lateral foot ulcer. She has excellent blood flow and great capillary refill noted on evaluation today which is excellent news. With that being said she does have discomfort but mainly at the site of the wounds especially lateral foot wound but nothing like what she was experiencing previously. This is rated to be a 3/10 at most. She is seen with her daughter present. 12/12/17 on evaluation today patient appears to be doing very well in regard to her right foot and right medial great toe ulcers. She has been tolerating the dressing changes without complication. She doesn't seem to have any significant discomfort which is also good news. Overall she has been doing excellent since her vascular intervention in my opinion. She actually does have follow-up later today with vascular for a repeat arterial study to ensure everything seems to be doing well following her surgery. Electronic Signature(s) Signed: 12/12/2017 7:21:18 PM By: Worthy Keeler PA-C Entered By: Worthy Keeler on 12/12/2017 19:10:30 [name] ([id number]) -------------------------------------------------------------------------------- Physical Exam Details Patient Name: [name] Date of Service: 12/12/2017 11:15 AM Medical Record  Number: [id number] Patient Account Number: [id number] Date of Birth/Sex: 1919/01/22 (84 y.o. Female) Treating RN: Carolyne Fiscal, Debi Primary Care Provider: PATIENT, NO Other Clinician: Referring Provider: Referral, Self Treating Provider/Extender: STONE III, Charmika Macdonnell Weeks in Treatment: 7 Constitutional Well-nourished and well-hydrated in no acute distress. Respiratory normal breathing without difficulty. Psychiatric this patient is able to make decisions and demonstrates good insight into disease process. Alert and Oriented x 3. pleasant and cooperative. Notes Patient's wound beds both appear to be doing better there signs of granulation and this is the first time that we have really noted that. Previously her wounds have just been slough covered. Overall I do believe she is on the right track as far as healing is concerned I did sharply debride both wounds today with good effects the left medial great toe ulcer appears to be almost completely healed there's just a very small areas centrally still open. Electronic Signature(s) Signed: 12/12/2017 7:21:18 PM By: Worthy Keeler PA-C Entered By: Worthy Keeler on 12/12/2017 19:11:23 [name] ([id number]) -------------------------------------------------------------------------------- Physician Orders Details Patient Name: [name] Date of Service: 12/12/2017 11:15 AM Medical Record Number: [id number] Patient Account Number: [id number] Date of Birth/Sex: 08-23-1919 (84 y.o. Female) Treating RN: Carolyne Fiscal, Debi Primary Care Provider: PATIENT, NO Other Clinician: Referring Provider: Referral, Self Treating Provider/Extender: STONE III, Vincenzina Jagoda Weeks in Treatment: 7 Verbal / Phone Orders: Yes Clinician: Carolyne Fiscal, Debi Read Back and Verified: Yes Diagnosis Coding ICD-10 Coding Code Description I70.235 Atherosclerosis of native arteries of right leg with ulceration of other part of foot I70.232 Atherosclerosis of native arteries of  right leg with ulceration of calf L97.512 Non-pressure chronic ulcer of other part of right foot with fat layer exposed I10 Essential (primary) hypertension Wound Cleansing Wound #6 Right,Medial Toe Great o Clean wound with Normal Saline. o Cleanse wound with mild soap and water o May Shower, gently pat wound dry prior to applying new dressing. Wound #7 Right,Dorsal Foot o Clean wound with Normal Saline. o Cleanse wound with mild soap and water o May Shower, gently pat wound  dry prior to applying new dressing. Anesthetic (add to Medication List) Wound #6 Right,Medial Toe Great o Topical Lidocaine 4% cream applied to wound bed prior to debridement (In Clinic Only). Wound #7 Right,Dorsal Foot o Topical Lidocaine 4% cream applied to wound bed prior to debridement (In Clinic Only). Skin Barriers/Peri-Wound Care Wound #7 Right,Dorsal Foot o Skin Prep Primary Wound Dressing Wound #6 Right,Medial Toe Great o Prisma Ag - moisten with saline Wound #7 Right,Dorsal Foot o Prisma Ag - moisten with saline Secondary Dressing Wound #6 Right,Medial Toe Great o Other - coverlet Wound #7 Right,Dorsal Foot o Dry Gauze [name], [name] ([id number]) o Tegaderm Dressing Change Frequency Wound #6 Right,Medial Toe Great o Change dressing every day. Wound #7 Right,Dorsal Foot o Change dressing every day. Follow-up Appointments Wound #6 Right,Medial Toe Great o Return Appointment in 1 week. Wound #7 Right,Dorsal Foot o Return Appointment in 1 week. Edema Control Wound #6 Right,Medial Toe Great o Elevate legs to the level of the heart and pump ankles as often as possible Wound #7 Right,Dorsal Foot o Elevate legs to the level of the heart and pump ankles as often as possible Off-Loading Wound #6 Right,Medial Toe Great o Turn and reposition every 2 hours Wound #7 Right,Dorsal Foot o Turn and reposition every 2 hours Additional Orders /  Instructions Wound #6 Right,Medial Toe Great o Increase protein intake. o Other: - Vitamin C, Zinc Wound #7 Right,Dorsal Foot o Increase protein intake. o Other: - Vitamin C, Zinc Patient Medications Allergies: codeine Notifications Medication Indication Start End lidocaine DOSE 1 - topical 4 % cream - 1 cream topical Electronic Signature(s) Signed: 12/12/2017 7:21:18 PM By: Worthy Keeler PA-C Signed: 12/13/2017 4:15:20 PM By: Alric Quan Entered By: Alric Quan on 12/12/2017 11:47:42 [name] ([id number]) Keyes, [name] ([id number]) -------------------------------------------------------------------------------- Prescription 12/12/2017 Patient Name: [name] Provider: Worthy Keeler PA-C Date of Birth: 10/27/1918 NPI#: [id number] Sex: F DEA#: [id number] Phone #: [phone] License #: Patient Address: [facility] [place], [place] [address] [address], [place], [place] [address] [phone] Allergies codeine Reaction: sick on stomach Severity: Severe Medication Medication: Route: Strength: Form: lidocaine topical 4% cream Class: TOPICAL LOCAL ANESTHETICS Dose: Frequency / Time: Indication: 1 1 cream topical Number of Refills: Number of Units: 0 Generic Substitution: Start Date: End Date: Administered at [place]: Yes Time Administered: Time Discontinued: Note to Pharmacy: Signature(s): Date(s): Electronic Signature(s) Signed: 12/12/2017 7:21:18 PM By: Worthy Keeler PA-C Signed: 12/13/2017 4:15:20 PM By: Myriam Jacobson, Shawndell ([id number]) Entered By: Alric Quan on 12/12/2017 11:47:43 [name] ([id number]) --------------------------------------------------------------------------------  Problem List Details Patient Name: [name] Date of Service: 12/12/2017 11:15 AM Medical  Record Number: [id number] Patient Account Number: [id number] Date of Birth/Sex: 02-Feb-1919 (84 y.o. 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 10/23/2017 Yes L97.512 Non-pressure chronic ulcer of other part of right foot with fat layer 10/23/2017 Yes exposed [place] (primary) hypertension 10/24/2017 Yes Inactive Problems Resolved Problems Electronic Signature(s) Signed: 12/12/2017 7:21:18 PM By: Worthy Keeler PA-C Entered By: Worthy Keeler on 12/12/2017 11:37:21 [name] ([id number]) -------------------------------------------------------------------------------- Progress Note Details Patient Name:  [name] Date of Service: 12/12/2017 11:15 AM Medical Record Number: [id number] Patient Account Number: [id number] Date of Birth/Sex: Mar 13, 1919 (84 y.o. Female) Treating RN: Carolyne Fiscal, Debi Primary Care Provider: PATIENT, NO Other Clinician: Referring Provider: Referral, Self Treating Provider/Extender: STONE III, Maydelin Deming Weeks in Treatment: 7 Subjective Chief Complaint Information obtained from Patient Right foot Ulcers History of Present Illness (HPI) 04/06/16; this is a 84 year old woman who arrives accompanied by 2 daughters for a wound on her left ankle and her left fifth toe. These have apparently been present for a year. I'm not quite certain how she came to this clinic however she was being followed by Sharlotte Alamo her podiatrist for these wounds. She was also referred to [facility] and they apparently did a test presumably arterial studies  although we don't have any of these results and we couldn't get through to the office today. The family but has been applying a combination of Bactroban and a light bandage and perhaps more recently Silvadene cream. She did have an x-ray of the foot roughly 6 months ago at the podiatry office the family was unaware that if there were any abnormalities. Apparently they have not seen any healing here. Our intake nurse noted a slight skin tear on the right anterior lower leg. Nobody seemed aware of this. ABIs calculated in this clinic was 0.3 on the right and 0.4 on the left I have reviewed things in [facility]. There is very little information on this patient. She apparently follows in current total clinic which we don't have information from. She has mentioned already been to a [facility]. She has a history of hypothyroidism, nephrolithiasis arthritis and has had a previous mastectomy. 04/13/16; patient's x-ray was normal. She is already been to see Dr. Delana Meyer vascular surgery. Her arterial exam was from November 2016 this showed a left ABI of 0.62 her right of 0.76. Her duplex ultrasound of the left leg showed biphasic waves in the common femoral and distal femoral artery however monophasic waves in the superficial femoral artery proximal and mid biphasic it distal. 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 The patient tells me that she has pain at night when she tries to lie down which is improved by getting up and sitting in the chair this sounds like claudication at rest. Her wounds are on the left medial malleolus and the dorsal fifth toe small punched out wounds that are right on bone. We use Santyl last week 04/20/16: nurse informed me pt has declined evaluation for significant PAD. she denies systemic s/s of infections. 04/27/16;; the patient has had noninvasive arterial studies done in November 2016. ABI and the left was 0.62. Monophasic waves at the superficial femoral artery. Occluded to the  posterior tibial artery. So had greater than 50% stenosis of the right superficial femoral and greater than 50% stenosis of the left superficial femoral artery. She had bilateral tibial peroneal artery disease. Both of the wounds on the left fifth toe and left lateral malleolus have been present for more than a year. They have been to see vein and vascular. The patient has some pain but miraculously I think the wounds have largely been stable. No evidence of infection 05/04/16; she goes for a noninvasive study tomorrow and then sees Dr. Fletcher Anon on Monday. By the time she is here next week we should have a better picture of whether something can be done with regards to her arterial flow. We continue to have ischemic-looking wounds on the left fifth toe  and left lateral malleolus. 05/10/16; the patient went for her arterial studies and saw Dr. Fletcher Anon. As predicted she is felt to have critical limb ischemia. The feeling is that she has occlusion of the SFA. The feeling would she would be a candidate for a stent to the SFA. The patient did not make a decision to proceed with the procedure and she is here with family members to discuss this me today. He shouldn't has a lot of pain and cannot sleep and rest well at night per her family. 05/24/16; the patient went and had a complex revascularization/angioplasty of the left superficial femoral artery followed by drug-coated balloon angioplasty and spots stenting. She tolerated the procedure well. She was recommended for dual antiplatelet drugs with Plavix and aspirin for at least a month. She went yesterday for I believe follow-up serial Dopplers and ABIs although I don't see these results. The patient is unfortunately complaining of a lot of pain in her bilateral lower legs below the knees from the ankle to the knees. She apparently was prescribed lidocaine and apparently put this on her legs instead of over the wound areas. This may have something to do with it  however there is a lot of edema in her bilateral legs I was able to find her arterial studies from yesterday. The left ABI has improved up to 0.66 post left SFA stent. The bilateral [name], [name] ([id number]) great toe indices remain abnormal with the left being in the 0.25 range. Duplex ultrasound showed her left SFA stent is patent monophasic waveforms persist in the left leg 06/07/16; continued punched-out areas over the dorsal left fifth toe and left lateral malleolus. No major improvement 06/28/16; the areas over her dorsal left fifth toe and left lateral malleolus are covered in surface slough we are using Iodoflex 07/05/16. We have been using Iodoflex for 2-3 weeks now. I have not been debridement is because of pain 07/19/16 currently we have been using Iodoflex for roughly the past month. Previously we were unable to debride due to pain although today patient states that the pain is not nearly as severe as it has been in the past. In fact she rates this to be a 1 out of 10 and it worse to a to 10 with palpation of the wound. All and all her and her daughter feel like this is actually doing steadily better at this point in time. She is pleased with progress and we have been seeing her every 2 weeks. 08/02/16; this is a delightful 84 year old woman I have not seen in over a month. She has arterial insufficiency wounds remaining over the left lateral malleolus and the left fifth toe. With the help of Dr. Fletcher Anon we are able to get her revascularized on the left. The 2 wounds on the left have been making good progress and are definitely smaller especially the area over the left lateral malleolus. She is certainly in a lot less pain than she used to be although I still think there is some claudication type pain. Unfortunately she is developed a area on the right lateral foot which is a small open area but I think is threatened. I suspect a small ischemic areas well. Also on her right fifth toe  there is an area that is not open but looks as though it is receiving too much pressure from footwear. Finally an area over her right malleolus although I don't think this is on its way to anything ominous 08/17/16 patient presents today for  follow up evaluation she tells me that she is really doing fairly well from a pain standpoint compared to where she has been previous. She is tolerating the dressing changes without any complication. She continues to have discharge and drainage however. 08-30-16 Ms. Claybrook presents today with her daughter, she states that she continues to have intermittent shooting pains to the left lateral fifth toe at this site of seems to be a healed ulcer. She denies any other issues that her wound related since her last appointment. Her daughter states that she is in need of a tramadol refill at this time as she uses half a tablet at at bedtime to aid in sleep due to foot pain. Iodoflex has been used on all wounds in previous dressing changes. 09/13/16; the patient has ischemic wounds in her feet. The area over the left lateral malleolus is healed and the area over the left fifth toe looks improved.. She has an area on the lateral aspect of her right foot which is a small but deep wound. Finally she has a new open area on the medial aspect of the left medial malleolus. This is superficial 09/27/16; the area over the left lateral malleolus remains healed. The area over the left fifth toe has a surface and she states the pain is better but I don't think this is completely closed. She has an area on the lateral aspect of her right foot is a small but deep wound. The new open area on the medial aspect of the left ankle was closed from last time. 10/18/16; the area over her left lateral malleolus remains healed. The area over the left fifth toe has a surface over the top of this however there is no overt open area. Given the underlying issues of severe PAD and continued pain in  the toe I would think it would be unlikely this is truly healed however I am not planning to debridement this area. The area on the right lateral foot which was a more recent wound is a small punched out painful area again has significant surface slough and nonviable tissue. It is clear the patient still has claudication type pain however she remains functional. I have been giving her tramadol when necessary and that seems to help a lot with her pain the patient follows up with Dr. Fletcher Anon on January 18/18 11/01/16; patient missed her follow-up with Dr. Fletcher Anon last week due to a snow day. Follow-up is now on February 15. The areas on the right lateral malleolus and dorsal right fifth toe remain closed over. The fifth toe was tentative is there is a surface eschar however I'm not going to disturb this. Therefore, her only open areas on the right lateral foot. This is a small punched out area. We have been using Prisma. I'm quite convinced this is an ischemic wound 11/15/16; patient has follow-up with Dr. Fletcher Anon on February 15. She has no open wound on the left leg and doesn't really complain of claudication that I can determine from talking to her. However on the right leg she clearly has some degree of claudication. The remaining open wound is on the right lateral foot. We have been using Iodosorb ointment 11/29/16; patient saw Dr. Fletcher Anon on 11/24/16. His comment is that her wound on her right lateral foot seems to be improving with local wound care. She has known significant right SFA disease. Her lower extremity Doppler will be repeated and according to the patient's daughter that appointment is on March 15.  He is left with the thought that endovascular intervention in the right SFA might be necessary. The patient has quite a bit of pain especially at night related to the wound in her right foot. We changed her to Iodosorb ointment last week 12/13/16; this is a patient I follow every 2 weeks largely on a  palliative approach at this point about ischemic wounds currently in the right foot. Initially she had them on her left lateral malleolus and left fifth toe. The area on the left lateral malleolus healed and the fifth toe has a surface eschar that I have elected not to remove. Both of these improved after revascularization by Dr. Fletcher Anon. We have been using Iodosorb to the right lateral foot not much change here. 12/27/16; the patient had her arterial studies. This showed known bilateral SFA disease. Stable right ABI 0.5 to stable left ABI at 0.7 to the did not do it TBI on the right it was 0.61 on the left she has a stent in the long segment of her left SFA from [place], [name] ([id number]) 05/18/16. All of her wounds are somewhat better. She states her pain is better in the right foot is improved. 01/10/17- patient is here for follow-up dilation of her right lateral foot ulcer. She has been tolerating Iodosorb. She is voices no complaints or concerns 01/24/17; small ischemic wound on right lateral foot. still non viable cover. follows with Dr. Fletcher Anon on 4/30. No open area on left foot 02/07/17 doing well and states she is pain free. Saw Dr. Fletcher Anon who said she is doing well and has "50% flow in the right leg and 70% in the left. According to her daughter he does not wish to do any further interventions 02/21/17; small ischemic wound on the right lateral foot. Per her daughter and the patient she has no open wound on the left foot and ankle which were her initial presenting wounds. still has claudication type pain at night she is been to see interventional cardiology who does not feel that she has a need for intervention on the right leg at present. She still has claudication type pain in the right leg which she manages at night with when necessary tramadol that I have prescribed 03/14/17; small ischemic wound on the right lateral foot. They've been dressing this at home with Iodosorb ointment. The patient does  not complain of any pain. The wound has a surface eschar over it and there is really no visible open area. This is similar to how the areas on the left leg healed 04/11/17; the small ischemic wound on her right lateral foot is finally closed over. Small amount of eschar over the surface however I did not attempt to remove this. The patient claims to be asymptomatic she is not having any claudication that I'm able to elicit although her activity is limited Readmission: 10/23/17 on evaluation today patient appears to be doing somewhat poorly in regard to her right foot where she has two ulcers at this point. The worst is on the lateral aspect of her foot medial first metatarsal region is not nearly as bad. With that being said these did arise seemingly out of nowhere she has previously had a similar issue in the past these have always been ischemic wounds due to arterial insufficiency. She does see Dr. Fletcher Anon as her vascular specialist and it has been noted that her ABI's are somewhat low. She actually has a repeat evaluation coming up this March 2019 to reevaluate her blood  flow. On the last check 12/22/16 it was noted that patient had stable ABI's in the lower moderate abnormal range in regard to the right in regard to the left and the upper moderate at normal range. Patient is having some discomfort though nothing severe at this point in time. She is seen with her daughter and son-in-law today. No fevers, chills, nausea, or vomiting noted at this time. 10/30/17 on evaluation today patient appears to be doing well in regard to her wounds. The right lateral foot wound appears to show signs of cleaning up nicely there is granulation noted underneath that the bed of the wound although there still some [facility] covering. Nonetheless she still has a lot of discomfort and I really do not want to proceed with debridement due to the fact that she does have so much discomfort. Good news is the tramadol has been  helping her at bedtime she only takes this once a day and that has been extremely beneficial. Unfortunately she does not have a primary right now as she is awaiting a new one which is the reason that I am prescribing the tramadol. Nonetheless I'm pleased with how things seem to be progressing in regard to her ulcers. 11/06/17 on evaluation today patient appears to be doing decently well as far as maintaining in regard to her ulcers. Unfortunately I did review her arterial study which shows that she has on the right arterial obstruction involving the superficial femoral and popliteal artery as well is the superficial femoral artery with a high grade stenosis versus inclusion. Collateral flow noted in the distal portion of the SFA. Severe progression is noted compared to previous study. Unfortunately being the patient's wounds do not seem to be healing as appropriately and quickly as we would like I do believe this is likely a result of poor vascular flow to the right lower extremity. This was discussed with patient and her daughter during the office visit today. 11/14/17 on evaluation today patient continues to have a fairly stalled ulcer in regard to her to ulcers on the right lower extremity. She obviously did have significant findings on her arterial study and I do believe this is again due to poor vascular flow. She does have an appointment later this afternoon with her vascular specialist for further evaluation to see if there any recommendations at that point. With that being said she continues to have discomfort which I do believe is arterial in nature in regard to insufficiency. 11/21/17 on evaluation today patient appears to be doing fairly well in regard to the appearance of her wounds other than the fact that she does have erythema surrounding the wound bed at this point in fact this encompasses the majority of the dorsal surface of her right foot. There is additional granulation noted today  that was not noted previously on evaluation and again if that were alone were the findings that we were seeing I would be very happy with the current progress. However unfortunately she is having the erythema which concerns me for the possibility of infection although the other possibility is that she could just be having a local reaction due to the I resort. In the past she never had this issue however when she use this for the root left foot which makes me again come back to my concern about infection. [name], [name] ([id number]) 11/28/17 on evaluation today patient is status post having had an angiogram performed on 11/22/17 where it was noted that she had severe disease involving  the TP trunk and proximal peroneal artery. Subsequently she underwent successful atherectomy and drug coated balloon angioplasty to the right SFA, TP trunk, and proximal peroneal artery. Patient stated only of has 10% residual stenosis and overall the procedure at the time was both tolerated well as well as considered a success. This was performed by Dr. Sophronia Simas. Patient was subsequently discharged home on 11/23/17 from the hospital. Overall she states that her leg pain is not nearly as severe as what it was previous which is excellent news. She also has been sleeping with her leg in the bed previously she was hanging this over the edge due to the pain. She also seems to have much better blood flow on evaluation today. 12/05/17 on evaluation today patient appears to be doing very well in regard to her quite lateral great toe as well as the right lateral foot ulcer. She has excellent blood flow and great capillary refill noted on evaluation today which is excellent news. With that being said she does have discomfort but mainly at the site of the wounds especially lateral foot wound but nothing like what she was experiencing previously. This is rated to be a 3/10 at most. She is seen with her daughter present. 12/12/17 on  evaluation today patient appears to be doing very well in regard to her right foot and right medial great toe ulcers. She has been tolerating the dressing changes without complication. She doesn't seem to have any significant discomfort which is also good news. Overall she has been doing excellent since her vascular intervention in my opinion. She actually does have follow-up later today with vascular for a repeat arterial study to ensure everything seems to be doing well following her surgery. Patient History Information obtained from Patient. Family History Diabetes - Mother, Heart Disease - Siblings, Stroke - Siblings, No family history of Cancer, Hereditary Spherocytosis, Hypertension, Kidney Disease, Lung Disease, Seizures, Thyroid Problems, Tuberculosis. Social History Never smoker, Marital Status - Widowed, Alcohol Use - Never, Drug Use - No History, Caffeine Use - Daily. Review of Systems (ROS) Constitutional Symptoms (General Health) Denies complaints or symptoms of Fever, Chills. Respiratory The patient has no complaints or symptoms. Cardiovascular The patient has no complaints or symptoms. Psychiatric The patient has no complaints or symptoms. Objective Constitutional Well-nourished and well-hydrated in no acute distress. Vitals Time Taken: 11:24 AM, Height: 64 in, Weight: 156 lbs, BMI: 26.8, Temperature: 98.0 F, Pulse: 86 bpm, Respiratory Rate: 16 breaths/min, Blood Pressure: 140/52 mmHg. [name], [name] ([id number]) Respiratory normal breathing without difficulty. Psychiatric this patient is able to make decisions and demonstrates good insight into disease process. Alert and Oriented x 3. pleasant and cooperative. General Notes: Patient's wound beds both appear to be doing better there signs of granulation and this is the first time that we have really noted that. Previously her wounds have just been slough covered. Overall I do believe she is on the right track as  far as healing is concerned I did sharply debride both wounds today with good effects the left medial great toe ulcer appears to be almost completely healed there's just a very small areas centrally still open. Integumentary (Hair, Skin) Wound #6 status is Open. Original cause of wound was Gradually Appeared. The wound is located on the [employer]. The wound measures 0.2cm length x 0.3cm width x 0.1cm depth; 0.047cm^2 area and 0.005cm^3 volume. There is no tunneling noted, however, there is undermining starting at 1:00 and ending at 1:00 with a maximum distance  of 0.4cm. There is a large amount of serous drainage noted. The wound margin is distinct with the outline attached to the wound base. There is medium (34-66%) pink granulation within the wound bed. There is a medium (34-66%) amount of necrotic tissue within the wound bed including Adherent Slough. Periwound temperature was noted as No Abnormality. The periwound has tenderness on palpation. Wound #7 status is Open. Original cause of wound was Gradually Appeared. The wound is located on the Right,Dorsal Foot. The wound measures 1cm length x 1.1cm width x 0.2cm depth; 0.864cm^2 area and 0.173cm^3 volume. There is Fat Layer (Subcutaneous Tissue) Exposed exposed. There is no tunneling or undermining noted. There is a large amount of serous drainage noted. The wound margin is distinct with the outline attached to the wound base. There is small (1-33%) pink granulation within the wound bed. There is a large (67-100%) amount of necrotic tissue within the wound bed including Adherent Slough. The periwound skin appearance exhibited: Induration, Maceration, Erythema. The surrounding wound skin color is noted with erythema which is circumferential. Periwound temperature was noted as No Abnormality. The periwound has tenderness on palpation. 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 There was a Skin/Subcutaneous Tissue Debridement ([id number]) debridement with total area of 0.06 sq cm performed by STONE III, Tyshana Nishida E., PA-C. with the following instrument(s): Curette to remove Viable and Non-Viable tissue/material including Exudate, Fibrin/Slough, and Subcutaneous after achieving pain control using Lidocaine 4% Topical Solution. A time out was conducted at 11:40, prior to the start of the procedure. A Broden, [name] ([id number]) Minimum amount of bleeding was controlled with Pressure. The procedure was tolerated well with a pain level of 0 throughout and a pain level of 0 following the procedure. Post Debridement Measurements: 0.3cm length x 0.2cm width x 0.2cm depth; 0.009cm^3 volume. Character of Wound/Ulcer Post Debridement requires further debridement. Severity of Tissue Post Debridement is: Fat layer exposed. Post procedure Diagnosis Wound #6: Same as Pre-Procedure Wound #7 Pre-procedure diagnosis of Wound #7 is an Arterial Insufficiency Ulcer located on the Right,Dorsal Foot .Severity of Tissue Pre Debridement is: Fat layer exposed. There was a Skin/Subcutaneous Tissue Debridement ([id number]) debridement with total area of 1.1 sq cm performed by STONE III, Treylin Burtch E., PA-C. with the following instrument(s): Curette to remove Viable tissue/material including Exudate, Fibrin/Slough, and Subcutaneous after achieving pain control using Lidocaine 4% Topical Solution. A time out was conducted at 11:40, prior to the start of the procedure. A Minimum  amount of bleeding was controlled with Pressure. The procedure was tolerated well with a pain level of 0 throughout and a pain level of 0 following the procedure. Post Debridement Measurements: 1cm length x 1.1cm width x 0.3cm depth; 0.259cm^3 volume. Character of Wound/Ulcer Post Debridement requires further debridement. Severity of Tissue Post Debridement is: Fat layer exposed. Post procedure Diagnosis Wound #7: Same as Pre-Procedure Plan Wound Cleansing: Wound #6 Right,Medial Toe Great: Clean wound with Normal Saline. Cleanse wound with mild soap and water May Shower, gently pat wound dry prior to applying new dressing. Wound #7 Right,Dorsal Foot: Clean wound  with Normal Saline. Cleanse wound with mild soap and water May Shower, gently pat wound dry prior to applying new dressing. Anesthetic (add to Medication List): Wound #6 Right,Medial Toe Great: Topical Lidocaine 4% cream applied to wound bed prior to debridement (In Clinic Only). Wound #7 Right,Dorsal Foot: Topical Lidocaine 4% cream applied to wound bed prior to debridement (In Clinic Only). Skin Barriers/Peri-Wound Care: Wound #7 Right,Dorsal Foot: Skin Prep Primary Wound Dressing: Wound #6 Right,Medial Toe Great: Prisma Ag - moisten with saline Wound #7 Right,Dorsal Foot: Prisma Ag - moisten with saline Secondary Dressing: Wound #6 Right,Medial Toe Great: Other - coverlet Wound #7 Right,Dorsal Foot: Dry Gauze Tegaderm Dressing Change Frequency: Wound #6 Right,Medial Toe Great: Change dressing every day. Wound #7 Right,Dorsal Foot: [name], [name] ([id number]) Change dressing every day. Follow-up Appointments: Wound #6 Right,Medial Toe Great: Return Appointment in 1 week. Wound #7 Right,Dorsal Foot: Return Appointment in 1 week. Edema Control: Wound #6 Right,Medial Toe Great: Elevate legs to the level of the heart and pump ankles as often as possible Wound #7 Right,Dorsal Foot: Elevate legs to the level of  the heart and pump ankles as often as possible Off-Loading: Wound #6 Right,Medial Toe Great: Turn and reposition every 2 hours Wound #7 Right,Dorsal Foot: Turn and reposition every 2 hours Additional Orders / Instructions: Wound #6 Right,Medial Toe Great: Increase protein intake. Other: - Vitamin C, Zinc Wound #7 Right,Dorsal Foot: Increase protein intake. Other: - Vitamin C, Zinc The following medication(s) was prescribed: lidocaine topical 4 % cream 1 1 cream topical was prescribed at facility We will continue with the current wound care orders for the next week. Patient is in agreement with the plan. Her daughter was present during the evaluation today as well. Please see above for specific wound care orders. We will see patient for re-evaluation in 1 week(s) here in the clinic. If anything worsens or changes patient will contact our office for additional recommendations. Electronic Signature(s) Signed: 12/12/2017 7:21:18 PM By: Worthy Keeler PA-C Entered By: Worthy Keeler on 12/12/2017 19:11:45 [name] ([id number]) -------------------------------------------------------------------------------- ROS/PFSH Details Patient Name: [name] Date of Service: 12/12/2017 11:15 AM Medical Record Number: [id number] Patient Account Number: [id number] Date of Birth/Sex: [birth year] (84 y.o. Female) Treating RN: Carolyne Fiscal, Debi Primary Care Provider: PATIENT, NO Other Clinician: Referring Provider: Referral, Self Treating Provider/Extender: STONE III, Marypat Kimmet Weeks in Treatment: 7 Information Obtained From Patient Wound History Do you currently have one or more open woundso Yes How many open wounds do you currently haveo 1 Approximately how long have you had your woundso 1.5 weeks How have you been treating your wound(s) until nowo bandaide Has your wound(s) ever healed and then re-openedo No Have you had any lab work done in the past montho No Have you tested positive for an  antibiotic resistant organism (MRSA, VRE)o No Have you tested positive for osteomyelitis (bone infection)o No Have you had any tests for circulation on your legso Yes Where [facility] the test [facility] Have you had other problems associated with your woundso Swelling Constitutional Symptoms (General Health) Complaints and Symptoms: Negative for: Fever; Chills Eyes Medical History: Positive for: Cataracts - had surgery Respiratory Complaints and Symptoms: No Complaints or Symptoms Cardiovascular Complaints and Symptoms: No Complaints or Symptoms Medical History: Positive for: Hypertension Musculoskeletal Medical History: Positive for: Osteoarthritis Oncologic Medical History: Negative for: Received Chemotherapy; Received Radiation Psychiatric [name], [name] ([id number]) Complaints and Symptoms: No Complaints or Symptoms HBO Extended History Items Eyes: Cataracts Immunizations Pneumococcal Vaccine: Received Pneumococcal Vaccination:  Yes Implantable Devices Family and Social History Cancer: No; Diabetes: Yes - Mother; Heart Disease: Yes - Siblings; Hereditary Spherocytosis: No; Hypertension: No; Kidney Disease: No; Lung Disease: No; Seizures: No; Stroke: Yes - Siblings; Thyroid Problems: No; Tuberculosis: No; Never smoker; Marital Status - Widowed; Alcohol Use: Never; Drug Use: No History; Caffeine Use: Daily; Financial Concerns: No; Food, Clothing or Shelter Needs: No; Support System Lacking: No; Transportation Concerns: No; Advanced Directives: No; Patient does not want information on Advanced Directives; Do not resuscitate: No; Living Will: No; Medical Power of Attorney: No Physician Affirmation I have reviewed and agree with the above information. Electronic Signature(s) Signed: 12/12/2017 7:21:18 PM By: Worthy Keeler PA-C Signed: 12/13/2017 4:15:20 PM By: Alric Quan Entered By: Worthy Keeler on 12/12/2017 19:11:02 [name]  ([id number]) -------------------------------------------------------------------------------- SuperBill Details Patient Name: [name] Date of Service: 12/12/2017 Medical Record Number: [id number] Patient Account Number: [id number] Date of Birth/Sex: 22-Sep-1919 (84 y.o. 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 I10 Essential (primary) hypertension Facility Procedures CPT4 Code: [id number] Description: 24462 - DEB SUBQ TISSUE 20 SQ CM/< ICD-10 Diagnosis Description L97.512 Non-pressure chronic ulcer of other part of right foot with fat l Modifier: ayer exposed Quantity: 1 Physician Procedures CPT4 Code: [id number] Description: 11657 - WC PHYS SUBQ TISS 20 SQ CM ICD-10 Diagnosis Description L97.512 Non-pressure chronic ulcer of other part of right foot with fat l Modifier: ayer exposed Quantity: 1 Electronic Signature(s) Signed: 12/12/2017 7:21:18 PM By: Worthy Keeler PA-C Entered By: Worthy Keeler on 12/12/2017 19:11:58

## 2017-12-14 NOTE — Progress Notes (Signed)
FRUMA, AFR[place] ([id number]) Visit Report for 12/12/2017 Arrival Information Details Patient Name: [name], [name] Date of Service: 12/12/2017 11:15 AM Medical Record Number: [id number] Patient Account Number: [id number] Date of Birth/Sex: 1919/05/15 (84 y.o. Female) Treating RN: Montey Hora Primary Care Jalina Blowers: PATIENT, NO Other Clinician: Referring Jaleiyah Alas: Referral, Self Treating Ronell Duffus/Extender: STONE III, HOYT Weeks in Treatment: 7 Visit Information History Since Last Visit Added or deleted any medications: No Patient Arrived: Walker Any new allergies or adverse reactions: No Arrival Time: 11:20 Had a fall or experienced change in No Accompanied By: dtr activities of daily living that may affect Transfer Assistance: None risk of falls: Patient Identification Verified: Yes Signs or symptoms of abuse/neglect since last visito No Secondary Verification Process Completed: Yes Hospitalized since last visit: No Patient Requires Transmission-Based Precautions: No Has Dressing in Place as Prescribed: Yes Patient Has Alerts: No Pain Present Now: No Electronic Signature(s) Signed: 12/13/2017 4:38:42 PM By: Montey Hora Entered By: Montey Hora on 12/12/2017 11:21:02 [name] ([id number]) -------------------------------------------------------------------------------- Encounter Discharge Information Details Patient Name: [name] Date of Service: 12/12/2017 11:15 AM Medical Record Number: [id number] Patient Account Number: [id number] Date of Birth/Sex: 1919-09-18 (84 y.o. Female) Treating RN: Roger Shelter Primary Care Emmogene Simson: PATIENT, NO Other Clinician: Referring Saki Legore: Referral, Self Treating Malichi Palardy/Extender: STONE III, HOYT Weeks in Treatment: 7 Encounter Discharge Information Items Discharge Pain Level: 0 Discharge Condition: Stable Ambulatory Status: Walker Discharge Destination: Home Private Transportation: Auto Accompanied By:  daughter Schedule Follow-up Appointment: Yes Medication Reconciliation completed and provided No to Patient/Care Sera Hitsman: Clinical Summary of Care: Electronic Signature(s) Signed: 12/13/2017 4:15:18 PM By: Roger Shelter Entered By: Roger Shelter on 12/12/2017 11:54:13 [name] ([id number]) -------------------------------------------------------------------------------- Lower Extremity Assessment Details Patient Name: [name] Date of Service: 12/12/2017 11:15 AM Medical Record Number: [id number] Patient Account Number: [id number] Date of Birth/Sex: [birth year] (84 y.o. Female) Treating RN: Montey Hora Primary Care Sheridan Hew: PATIENT, NO Other Clinician: Referring Neldon Shepard: Referral, Self Treating Arfa Lamarca/Extender: STONE III, HOYT Weeks in Treatment: 7 Edema Assessment Assessed: [Left: No] [Right: No] Edema: [Left: N] [Right: o] Vascular Assessment Claudication: Claudication Assessment [Right:Rest Pain] Pulses: Dorsalis Pedis Palpable: [Right:Yes] Posterior Tibial Extremity colors, hair growth, and conditions: Extremity Color: [Right:Normal] Hair Growth on Extremity: [Right:No] Temperature of Extremity: [Right:Warm] Capillary Refill: [Right:< 3 seconds] Toe Nail Assessment Left: Right: Thick: Yes Discolored: Yes Deformed: No Improper Length and Hygiene: No Electronic Signature(s) Signed: 12/13/2017 4:38:42 PM By: Montey Hora Entered By: Montey Hora on 12/12/2017 11:30:42 [name] ([id number]) -------------------------------------------------------------------------------- Multi Wound Chart Details Patient Name: [name] Date of Service: 12/12/2017 11:15 AM Medical Record Number: [id number] Patient Account Number: [id number] Date of Birth/Sex: 22-Feb-1919 (84 y.o. 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] [7:Arterial Insufficiency Ulcer] [N/A:N/A] Comorbid History: [6:Cataracts, Hypertension, Osteoarthritis] [7:Cataracts, Hypertension, Osteoarthritis] [N/A:N/A] Date Acquired: [6:10/16/2017] [7:10/11/2017] [N/A:N/A] Weeks of Treatment: [6:7] [7:7] [N/A:N/A] Wound Status: [6:Open] [7:Open] [N/A:N/A] Measurements L x W x D [6:0.2x0.3x0.1] [7:1x1.1x0.2] [N/A:N/A] (cm) Area (cm) : [6:0.047] [7:0.864] [N/A:N/A] Volume (cm) : [6:0.005] [7:0.173] [N/A:N/A] % Reduction in Area: [6:70.10%] [7:-52.90%] [N/A:N/A] % Reduction in Volume: [6:68.80%] [7:-53.10%] [N/A:N/A] Starting Position 1 [6:1] (o'clock): Ending Position  1 [6:1] (o'clock): Maximum Distance 1 (cm): [6:0.4] Undermining: [6:Yes] [7:No] [N/A:N/A] Classification: [6:Partial Thickness] [7:Partial Thickness] [N/A:N/A] Exudate Amount: [6:Large] [7:Large] [N/A:N/A] Exudate Type: [6:Serous] [7:Serous] [N/A:N/A] Exudate Color: [6:amber] [7:amber] [N/A:N/A] Wound Margin: [6:Distinct, outline attached] [7:Distinct, outline attached] [N/A:N/A] Granulation Amount: [6:Medium (34-66%)] [7:Small (1-33%)] [N/A:N/A] Granulation Quality: [6:Pink] [7:Pink] [N/A:N/A] Necrotic Amount: [6:Medium (34-66%)] [7:Large (67-100%)] [N/A:N/A] Exposed Structures: [6:Fascia: No Fat Layer (Subcutaneous Tissue) Exposed: No Tendon: No Muscle: No Joint: No Bone: No] [7:Fat Layer (Subcutaneous Tissue) Exposed: Yes Fascia: No Tendon: No Muscle: No Joint: No Bone: No] [N/A:N/A] Epithelialization: [6:Medium (34-66%)] [7:None]  [N/A:N/A] Periwound Skin Texture: No Abnormalities Noted Induration: Yes N/A Periwound Skin Moisture: No Abnormalities Noted Maceration: Yes N/A Periwound Skin Color: No Abnormalities Noted Erythema: Yes N/A Erythema Location: N/A Circumferential N/A Temperature: No Abnormality No Abnormality N/A Tenderness on Palpation: Yes Yes N/A Wound Preparation: Ulcer Cleansing: Ulcer Cleansing: N/A Rinsed/Irrigated with Saline Rinsed/Irrigated with Saline Topical Anesthetic Applied: Topical Anesthetic Applied: Other: lidocaine 4% Other: lidocaine 4% Treatment Notes Electronic Signature(s) Signed: 12/13/2017 4:15:20 PM By: Alric Quan Entered By: Alric Quan on 12/12/2017 11:39:13 [name] ([id number]) -------------------------------------------------------------------------------- Multi-Disciplinary Care Plan Details Patient Name: [name] Date of Service: 12/12/2017 11:15 AM Medical Record Number: [id number] Patient Account Number: [id number] Date of Birth/Sex: 22-Oct-1918 (84 y.o. Female) Treating RN: Carolyne Fiscal, Debi Primary Care Zynia Wojtowicz: PATIENT, NO Other Clinician: Referring Isaac Lacson: Referral, Self Treating Eleftheria Taborn/Extender: STONE III, HOYT Weeks in Treatment: 7 Active Inactive ` Abuse / Safety / Falls / Self Care Management Nursing Diagnoses: History of Falls Potential for falls Goals: Patient will not experience any injury related to falls Date Initiated: 10/23/2017 Target Resolution Date: 02/17/2018 Goal Status: Active Interventions: Assess Activities of Daily Living upon admission and as needed Assess fall risk on admission and as needed Assess: immobility, friction, shearing, incontinence upon admission and as needed Assess impairment of mobility on admission and as needed per policy Assess personal safety and home safety (as indicated) on admission and as needed Assess self care needs on admission and as needed Notes: ` Nutrition Nursing  Diagnoses: Imbalanced nutrition Potential for alteratiion in Nutrition/Potential for imbalanced nutrition Goals: Patient/caregiver agrees to and verbalizes understanding of need to use nutritional supplements and/or vitamins as prescribed Date Initiated: 10/23/2017 Target Resolution Date: 02/17/2018 Goal Status: Active Interventions: Assess patient nutrition upon admission and as needed per policy Notes: ` Orientation to the Wound Care Program Nursing Diagnoses: EUDORA, GUEVARRA ([id number]) Knowledge deficit related to the wound healing center program Goals: Patient/caregiver will verbalize understanding of the [place] Program Date Initiated: 10/23/2017 Target Resolution Date: 11/18/2017 Goal Status: Active Interventions: Provide education on orientation to the wound center Notes: ` Pain, Acute or Chronic Nursing Diagnoses: Pain, acute or chronic: actual or potential Potential alteration in comfort, pain Goals: Patient/caregiver will verbalize adequate pain control between visits Date Initiated: 10/23/2017 Target Resolution Date: 02/17/2018 Goal Status: Active Interventions: Complete pain assessment as per visit requirements Notes: ` Wound/Skin Impairment Nursing Diagnoses: Impaired tissue integrity Knowledge deficit related to ulceration/compromised skin integrity Goals: Ulcer/skin breakdown will have a volume reduction of 80% by week 12 Date Initiated: 10/23/2017 Target Resolution Date: 02/17/2018 Goal Status: Active Interventions: Assess patient/caregiver ability to perform ulcer/skin care regimen upon admission and as needed Assess ulceration(s) every visit Notes: Electronic Signature(s) Signed: 12/13/2017 4:15:20 PM By: Alric Quan Entered By: Alric Quan on 12/12/2017 11:38:43 [name] ([id number]) -------------------------------------------------------------------------------- Pain Assessment Details Patient Name: [name] Date of Service: 12/12/2017 11:15 AM Medical Record Number: [id number] Patient Account Number:  [id number] Date of Birth/Sex: 10-11-1918 (84 y.o. Female) Treating RN: Montey Hora Primary Care Nakota Ackert: PATIENT, NO Other Clinician: Referring Jakory Matsuo: Referral, Self Treating Hakeem Frazzini/Extender: STONE III, HOYT Weeks in Treatment: 7 Active Problems Location of Pain Severity and Description of Pain Patient Has Paino Yes Site Locations Pain Location: Pain in Ulcers With Dressing Change: Yes Duration of the Pain. Constant / Intermittento Intermittent Pain Management and Medication Current Pain Management: Notes Topical or injectable lidocaine is offered to patient for acute pain when surgical debridement is performed. If needed, Patient is instructed to use over the counter pain medication for the following 24-48 hours after debridement. Wound care MDs do not prescribed pain medications. Patient has chronic pain or uncontrolled pain. Patient has been instructed to make an appointment with their Primary Care Physician for pain management. Electronic Signature(s) Signed: 12/13/2017 4:38:42 PM By: Montey Hora Entered By: Montey Hora on 12/12/2017 11:21:34 [name] ([id number]) -------------------------------------------------------------------------------- Patient/Caregiver Education Details Patient Name: [name] Date of Service: 12/12/2017 11:15 AM Medical Record Number: [id number] Patient Account Number: [id number] Date of Birth/Gender: [birth year] (84 y.o. Female) Treating RN: Roger Shelter Primary Care Physician: PATIENT, NO Other Clinician: Referring Physician: Referral, Self Treating Physician/Extender: Melburn Hake, HOYT Weeks in Treatment: 7 Education Assessment Education Provided To: Patient Education Topics Provided Wound Debridement: Handouts: Wound Debridement Methods: Explain/Verbal Responses: State content correctly Wound/Skin  Impairment: Handouts: Caring for Your Ulcer Methods: Explain/Verbal Responses: State content correctly Electronic Signature(s) Signed: 12/13/2017 4:15:18 PM By: Roger Shelter Entered By: Roger Shelter on 12/12/2017 11:54:31 [name] ([id number]) -------------------------------------------------------------------------------- Wound Assessment Details Patient Name: [name] Date of Service: 12/12/2017 11:15 AM Medical Record Number: [id number] Patient Account Number: [id number] Date of Birth/Sex: 07-Aug-1919 (84 y.o. Female) Treating RN: Montey Hora Primary Care Niambi Smoak: PATIENT, NO Other Clinician: Referring Hewitt Garner: Referral, Self Treating Tregan Read/Extender: STONE III, HOYT Weeks in Treatment: 7 Wound Status Wound Number: 6 Primary Etiology: Arterial Insufficiency Ulcer Wound Location: Right Toe Great - Medial Wound Status: Open Wounding Event: Gradually Appeared Comorbid History: Cataracts, Hypertension, Osteoarthritis Date Acquired: 10/16/2017 Weeks Of Treatment: 7 Clustered Wound: No Photos Photo Uploaded By: Montey Hora on 12/12/2017 11:44:09 Wound Measurements Length: (cm) 0.2 Width: (cm) 0.3 Depth: (cm) 0.1 Area: (cm) 0.047 Volume: (cm) 0.005 % Reduction in Area: 70.1% % Reduction in Volume: 68.8% Epithelialization: Medium (34-66%) Tunneling: No Undermining: Yes Starting Position (o'clock): 1 Ending Position (o'clock): 1 Maximum Distance: (cm) 0.4 Wound Description Classification: Partial Thickness Wound Margin: Distinct, outline attached Exudate Amount: Large Exudate Type: Serous Exudate Color: amber Foul Odor After Cleansing: No Slough/Fibrino No Wound Bed Granulation Amount: Medium (34-66%) Exposed Structure Granulation Quality: Pink Fascia Exposed: No Necrotic Amount: Medium (34-66%) Fat Layer (Subcutaneous Tissue) Exposed: No Necrotic Quality: Adherent Slough Tendon Exposed: No Muscle Exposed: No [place], Katlyne  ([id number]) Joint Exposed: No Bone Exposed: No Periwound Skin Texture Texture Color No Abnormalities Noted: No No Abnormalities Noted: No Moisture Temperature / Pain No Abnormalities Noted: No Temperature: No Abnormality Tenderness on Palpation: Yes Wound Preparation Ulcer Cleansing: Rinsed/Irrigated with Saline Topical Anesthetic Applied: Other: lidocaine 4%, Treatment Notes Wound #6 (Right, Medial Toe Great) 1. Cleansed with: Clean wound with Normal Saline 2. Anesthetic Topical Lidocaine 4% cream to wound bed prior to debridement 4. Dressing Applied: Prisma Ag Notes great toe-bandaid top of foot - gauze and tegaderm Electronic Signature(s) Signed: 12/13/2017 4:38:42 PM By: Montey Hora Entered By: Montey Hora on 12/12/2017 11:29:42 [name] ([id number]) -------------------------------------------------------------------------------- Wound Assessment Details Patient Name: [name] Date of Service: 12/12/2017 11:15 AM Medical Record  Number: [id number] Patient Account Number: [id number] Date of Birth/Sex: Jan 06, 1919 (84 y.o. Female) Treating RN: Montey Hora Primary Care Shantell Belongia: PATIENT, NO Other Clinician: Referring Maren Wiesen: Referral, Self Treating Aashi Derrington/Extender: STONE III, HOYT Weeks in Treatment: 7 Wound Status Wound Number: 7 Primary Etiology: Arterial Insufficiency Ulcer Wound Location: Right Foot - Dorsal Wound Status: Open Wounding Event: Gradually Appeared Comorbid History: Cataracts, Hypertension, Osteoarthritis Date Acquired: 10/11/2017 Weeks Of Treatment: 7 Clustered Wound: No Photos Photo Uploaded By: Montey Hora on 12/12/2017 11:44:09 Wound Measurements Length: (cm) 1 Width: (cm) 1.1 Depth: (cm) 0.2 Area: (cm) 0.864 Volume: (cm) 0.173 % Reduction in Area: -52.9% % Reduction in Volume: -53.1% Epithelialization: None Tunneling: No Undermining: No Wound Description Classification: Partial Thickness Wound Margin:  Distinct, outline attached Exudate Amount: Large Exudate Type: Serous Exudate Color: amber Foul Odor After Cleansing: No Slough/Fibrino Yes Wound Bed Granulation Amount: Small (1-33%) Exposed Structure Granulation Quality: Pink Fascia Exposed: No Necrotic Amount: Large (67-100%) Fat Layer (Subcutaneous Tissue) Exposed: Yes Necrotic Quality: Adherent Slough Tendon Exposed: No Muscle Exposed: No Joint Exposed: No Bone Exposed: No Periwound Skin Texture Lasala, Marcel ([id number]) Texture Color No Abnormalities Noted: No No Abnormalities Noted: No Induration: Yes Erythema: Yes Erythema Location: Circumferential Moisture No Abnormalities Noted: No Temperature / Pain Maceration: Yes Temperature: No Abnormality Tenderness on Palpation: Yes Wound Preparation Ulcer Cleansing: Rinsed/Irrigated with Saline Topical Anesthetic Applied: Other: lidocaine 4%, Treatment Notes Wound #7 (Right, Dorsal Foot) 1. Cleansed with: Clean wound with Normal Saline 2. Anesthetic Topical Lidocaine 4% cream to wound bed prior to debridement 4. Dressing Applied: Prisma Ag Notes great toe-bandaid top of foot - gauze and tegaderm Electronic Signature(s) Signed: 12/13/2017 4:38:42 PM By: Montey Hora Entered By: Montey Hora on 12/12/2017 11:28:13 [name] ([id number]) -------------------------------------------------------------------------------- [place] Details Patient Name: [name] Date of Service: 12/12/2017 11:15 AM Medical Record Number: [id number] Patient Account Number: [id number] Date of Birth/Sex: [birth year] (84 y.o. Female) Treating RN: Montey Hora Primary Care Latesha Chesney: PATIENT, NO Other Clinician: Referring Henry Demeritt: Referral, Self Treating Ramar Nobrega/Extender: STONE III, HOYT Weeks in Treatment: 7 Vital Signs Time Taken: 11:24 Temperature (F): 98.0 Height (in): 64 Pulse (bpm): 86 Weight (lbs): 156 Respiratory Rate (breaths/min): 16 Body Mass Index  (BMI): 26.8 Blood Pressure (mmHg): 140/52 Reference Range: 80 - 120 mg / dl Electronic Signature(s) Signed: 12/13/2017 4:38:42 PM By: Montey Hora Entered By: Montey Hora on 12/12/2017 11:24:35

## 2017-12-15 ENCOUNTER — Encounter: Payer: Self-pay | Admitting: Cardiovascular Disease

## 2017-12-15 ENCOUNTER — Ambulatory Visit: Payer: Medicare PPO | Admitting: Cardiovascular Disease

## 2017-12-15 VITALS — BP 142/60 | HR 76 | Ht 64.0 in | Wt 145.0 lb

## 2017-12-15 DIAGNOSIS — I480 Paroxysmal atrial fibrillation: Secondary | ICD-10-CM

## 2017-12-15 DIAGNOSIS — I739 Peripheral vascular disease, unspecified: Secondary | ICD-10-CM

## 2017-12-15 DIAGNOSIS — E785 Hyperlipidemia, unspecified: Secondary | ICD-10-CM | POA: Diagnosis not present

## 2017-12-15 DIAGNOSIS — I10 Essential (primary) hypertension: Secondary | ICD-10-CM | POA: Diagnosis not present

## 2017-12-15 NOTE — Patient Instructions (Signed)
Medication Instructions: Continue same medications.   Labwork: None.   Procedures/Testing: None.   Follow-Up: 1 month with Dr. Fletcher Anon.   Any Additional Special Instructions Will Be Listed Below (If Applicable).     If you need a refill on your cardiac medications before your next appointment, please call your pharmacy.

## 2017-12-15 NOTE — Progress Notes (Signed)
Cardiology Office Note   Date:  12/15/2017   ID:  [name], DOB [birth year], MRN [id number]  PCP:  Patient, No Pcp Per  Cardiologist:   [name], MD   Chief Complaint  Patient presents with  . Other    1 month follow up. Patient denies chest pain and SOB. Patient states she just has a sore on her right leg. Meds reviewed verbally with patient.       History of Present Illness: [name] is a 84 y.o. female who Is here today for a follow-up visit regarding peripheral arterial disease. The patient has no previous cardiac history and no history of diabetes or tobacco use. She is known to have hypertension, hyperlipidemia and hypothyroidism.   She was seen in 2017 for nonhealing wounds on the left foot.  Noninvasive vascular evaluation showed an ABI of 0.55 on the right and 0.40 on the left. Duplex showed relatively long occlusion of the mid to distal left SFA with two-vessel runoff below the knee. Angiography in August, 2017 showed no significant aortoiliac disease. There was long occlusion of the left SFA with 1 vessel runoff below the knee via the peroneal artery which was occluded but gave collaterals to the dorsalis pedis. I performed successful angioplasty of the left SFA followed by drug-coated balloon angioplasty and spot stenting with self-expanding stent in the midsegment. The ulcers on the left foot has healed completely.   She was seen recently for  nonhealing ulceration on the right foot with severe rest pain.  She has a deep ulcer on the right lateral foot as well as a superficial ulcer on the big toe.   She underwent a vascular studies which showed severely reduced ABI on the right 0.4 with evidence of high-grade stenosis in the right distal SFA or occlusion. I proceeded with angiography which showed moderate calcified disease involving the proximal and mid SFA followed by short occlusion distally with moderate disease in the popliteal artery and one-vessel  runoff below the knee via the peroneal artery with severe disease involving the TP trunk and proximal peroneal artery.  I performed successful orbital atherectomy and drug-coated balloon angioplasty to the right SFA, TP trunk and proximal peroneal artery. Postprocedure ABI improved to 0.79 on the right.  However, there was persistent to severely elevated velocity in the mid SFA at 546. The wound is healing slowly.  Rest pain also has improved significantly. She did have a generalized rash one day after the procedure which was thought to be due to delayed contrast reaction.  She was given short course of prednisone with resolution.  Past Medical History:  Diagnosis Date  . Arthritis    "legs" (11/22/2017)  . Breast cancer, right breast ([place])    mastectomy 30 yr ago  . DVT (deep venous thrombosis) ([place])    "found an old clot in her ?left leg when they were doing vascular studies; put her on ASA" (05/18/2016)  . Family history of adverse reaction to anesthesia    "all the family get PONV" (05/18/2016)  . High cholesterol   . Hypertension   . Hypothyroidism   . kidney calculi   . PAD (peripheral artery disease) ([place])   . Pneumonia 1990s  . PONV (postoperative nausea and vomiting)   . Shortness of breath     Past Surgical History:  Procedure Laterality Date  . ABDOMINAL AORTOGRAM W/LOWER EXTREMITY N/A 11/22/2017   Procedure: ABDOMINAL AORTOGRAM W/LOWER EXTREMITY;  Surgeon: Wellington Hampshire, MD;  Location:  [facility];  Service: Cardiovascular;  Laterality: N/A;  . BALLOON ANGIOPLASTY, ARTERY Left 05/18/2016   SFA/notes 05/18/2016  . BREAST BIOPSY Right   . CATARACT EXTRACTION W/ INTRAOCULAR LENS  IMPLANT, BILATERAL Bilateral 2014  . CYSTOSCOPY W/ LITHOLAPAXY / [facility]    . CYSTOSCOPY W/ URETERAL STENT PLACEMENT  01/17/2012   Procedure: CYSTOSCOPY WITH RETROGRADE PYELOGRAM/URETERAL STENT PLACEMENT;  Surgeon: Franchot Gallo, MD;  Location: [facility];  Service: Urology;  Laterality: Left;  .  CYSTOSCOPY W/ URETERAL STENT PLACEMENT  11/10/2010   Archie Endo 11/10/2010  . CYSTOSCOPY W/ URETERAL STENT REMOVAL  11/22/2010   Archie Endo 11/22/2010  . FRACTURE SURGERY    . MASTECTOMY COMPLETE / SIMPLE Right   . PERIPHERAL VASCULAR ATHERECTOMY Right 11/22/2017   SFA/notes 11/22/2017  . PERIPHERAL VASCULAR ATHERECTOMY Right 11/22/2017   Procedure: PERIPHERAL VASCULAR ATHERECTOMY;  Surgeon: Wellington Hampshire, MD;  Location: [facility];  Service: Cardiovascular;  Laterality: Right;  . PERIPHERAL VASCULAR CATHETERIZATION N/A 05/18/2016   Procedure: Abdominal Aortogram w/Lower Extremity;  Surgeon: Wellington Hampshire, MD;  Location: [facility];  Service: Cardiovascular;  Laterality: N/A;  . WRIST FRACTURE SURGERY Left      Current Outpatient Medications  Medication Sig Dispense Refill  . acetaminophen (TYLENOL) 500 MG tablet Take 1,000 mg by mouth every 6 (six) hours as needed for moderate pain or headache.    Marland Kitchen amLODipine (NORVASC) 5 MG tablet Take 5 mg by mouth at bedtime.     Marland Kitchen aspirin EC 81 MG EC tablet Take 1 tablet (81 mg total) by mouth daily.    Marland Kitchen atorvastatin (LIPITOR) 20 MG tablet Take 1 tablet (20 mg total) by mouth daily at 6 PM. 30 tablet 1  . cadexomer iodine (IODOSORB) 0.9 % gel Apply 1 application topically every other day.    . cetirizine (ZYRTEC) 10 MG tablet Take 10 mg by mouth daily as needed for allergies.     Marland Kitchen clopidogrel (PLAVIX) 75 MG tablet Take 1 tablet (75 mg total) by mouth daily with breakfast. 30 tablet 5  . Cyanocobalamin (VITAMIN B-12 PO) Take 1 tablet by mouth every other day.    . levothyroxine (SYNTHROID, LEVOTHROID) 50 MCG tablet Take 50 mcg by mouth daily with breakfast.     . losartan (COZAAR) 50 MG tablet Take 50 mg by mouth at bedtime.     . Multiple Vitamins-Minerals (EYE VITAMINS PO) Take 1 capsule by mouth daily.    Vladimir Faster Glycol-Propyl Glycol (SYSTANE OP) Place 1 drop into both eyes daily as needed (for dry eyes).    . traMADol (ULTRAM) 50  MG tablet Take 50 mg by mouth at bedtime.      No current facility-administered medications for this visit.     Allergies:   Codeine    Social History:  The patient  reports that  has never smoked. she has never used smokeless tobacco. She reports that she does not drink alcohol or use drugs.   Family History:  The patient's Family history is remarkable for coronary artery disease.   ROS:  Please see the history of present illness.   Otherwise, review of systems are positive for none.   All other systems are reviewed and negative.    PHYSICAL EXAM: VS:  BP (!) 142/60 (BP Location: Left Arm, Patient Position: Sitting, Cuff Size: Normal)   Pulse 76   Ht 5\' 4"  (1.626 m)   Wt 145 lb (65.8 kg)   BMI 24.89 kg/m  ,  BMI Body mass index is 24.89 kg/m. GEN: Well nourished, well developed, in no acute distress  HEENT: normal  Neck: no JVD, carotid bruits, or masses Cardiac: Irregularly irregular; no murmurs, rubs, or gallops,no edema  Respiratory:  clear to auscultation bilaterally, normal work of breathing GI: soft, nontender, nondistended, + BS MS: no deformity or atrophy  Skin: warm and dry, no rash Neuro:  Strength and sensation are intact Psych: euthymic mood, full affect Vascular: Femoral pulses are normal bilaterally.  The wounds on the right foot are improved from before.  The lateral ulceration is not as deep as before with some granulation tissue.  The ulceration on the big toe is almost completely healed.   EKG:  EKG is  ordered today. EKG showed normal sinus rhythm with incomplete right bundle branch block, LVH with repolarization abnormalities  Recent Labs: 11/14/2017: Hemoglobin 13.4; Platelets 326 11/23/2017: BUN 11; Creatinine, Ser 0.70; Potassium 3.8; Sodium 142    Lipid Panel No results found for: CHOL, TRIG, HDL, CHOLHDL, VLDL, LDLCALC, LDLDIRECT    Wt Readings from Last 3 Encounters:  12/15/17 145 lb (65.8 kg)  11/23/17 152 lb 1.9 oz (69 kg)  11/14/17 152 lb  (68.9 kg)      Other studies Reviewed: Additional studies/ records that were reviewed today include: Office note from the wound center.    ASSESSMENT AND PLAN:  1.  Peripheral arterial disease: Critical limb ischemia affecting the right foot with nonhealing ulceration and severe rest pain.   Status post successful endovascular intervention.  Postprocedure ABI improved to 0.79 on the right. However, there continues to be elevated velocity in the mid SFA which could be due to residual dissection.  Continue wound care and medical therapy for now.  If the wounds do not heal within 1 month, relook angiography might be needed with stenting.  2. Essential hypertension: Blood pressure is reasonably controlled  3. Hyperlipidemia: I started her on atorvastatin recently.  4.  Paroxysmal atrial fibrillation: She is back in sinus rhythm.  Disposition:   FU with me in 1 month  Signed,  [name], MD  12/15/2017 4:28 PM    [place]

## 2017-12-19 ENCOUNTER — Encounter: Payer: Medicare PPO | Admitting: Physician Assistant

## 2017-12-19 DIAGNOSIS — L97512 Non-pressure chronic ulcer of other part of right foot with fat layer exposed: Secondary | ICD-10-CM | POA: Diagnosis not present

## 2017-12-21 NOTE — Progress Notes (Signed)
ASHAWNTI, TANGEN ([id number]) Visit Report for 12/19/2017 Chief Complaint Document Details Patient Name: [name], [name] Date of Service: 12/19/2017 11:15 AM Medical Record Number: [id number] Patient Account Number: [id number] Date of Birth/Sex: Sep 19, 1919 (84 y.o. Female) Treating RN: Carolyne Fiscal, Debi Primary Care Provider: PATIENT, NO Other Clinician: Referring Provider: Referral, Self Treating Provider/Extender: STONE III, HOYT Weeks in Treatment: 8 Information Obtained from: Patient Chief Complaint Right foot Ulcers Electronic Signature(s) Signed: 12/20/2017 12:09:36 AM By: Worthy Keeler PA-C Entered By: Worthy Keeler on 12/19/2017 23:24:11 [name] ([id number]) -------------------------------------------------------------------------------- Debridement Details Patient Name: [name] Date of Service: 12/19/2017 11:15 AM Medical Record Number: [id number] Patient Account Number: [id number] Date of Birth/Sex: [birth year] (84 y.o. Female) Treating RN: Carolyne Fiscal, Debi Primary Care Provider: PATIENT, NO Other Clinician: Referring Provider: Referral, Self Treating Provider/Extender: STONE III, HOYT Weeks in Treatment: 8 Debridement Performed for Wound #7 Right,Dorsal Foot Assessment: Performed By: Physician STONE III, HOYT E., PA-C Debridement: Debridement Severity of Tissue Pre Fat layer exposed Debridement: Pre-procedure Verification/Time Yes - 11:34 Out Taken: Start Time: 11:35 Pain Control: Lidocaine 4% Topical Solution Level: Skin/Subcutaneous Tissue Total Area Debrided (L x W): 0.9 (cm) x 1 (cm) = 0.9 (cm) Tissue and other material Viable, Non-Viable, Exudate, Fibrin/Slough, Subcutaneous debrided: Instrument: Curette Bleeding: Minimum Hemostasis Achieved: Pressure End Time: 11:36 Procedural Pain: 0 Post Procedural Pain: 0 Response to Treatment: Procedure was tolerated well Post Debridement Measurements of Total Wound Length: (cm) 0.9 Width: (cm)  1 Depth: (cm) 0.3 Volume: (cm) 0.212 Character of Wound/Ulcer Post Debridement: Requires Further Debridement Severity of Tissue Post Debridement: Fat layer exposed Post Procedure Diagnosis Same as Pre-procedure Electronic Signature(s) Signed: 12/20/2017 12:09:36 AM By: Worthy Keeler PA-C Signed: 12/20/2017 8:38:15 AM By: Alric Quan Entered By: Alric Quan on 12/19/2017 11:36:21 [name] ([id number]) -------------------------------------------------------------------------------- HPI Details Patient Name: [name] Date of Service: 12/19/2017 11:15 AM Medical Record Number: [id number] Patient Account Number: [id number] Date of Birth/Sex: [birth year] (84 y.o. Female) Treating RN: Carolyne Fiscal, Debi Primary Care Provider: PATIENT, NO Other Clinician: Referring Provider: Referral, Self Treating Provider/Extender: STONE III, HOYT Weeks in Treatment: 8 History of Present Illness HPI Description: 04/06/16; this is a 84 year old woman who arrives accompanied by 2 daughters for a wound on her left ankle and her left fifth toe. These have apparently been present for a year. I'm not quite certain how she came to this clinic however she was being followed by Sharlotte Alamo her podiatrist for these wounds. She was also referred to [facility] and they apparently did a test presumably arterial studies although we don't have any of these results and we couldn't get through to the office today. The family but has been applying a combination of Bactroban and a light bandage and perhaps more recently Silvadene cream. She did have an x-ray of the foot roughly 6 months ago at the podiatry office the family was unaware that if there were any abnormalities. Apparently they have not seen any healing here. Our intake nurse noted a slight skin tear on the right anterior lower leg. Nobody seemed aware of this. ABIs calculated in this clinic was 0.3 on the right and 0.4 on the  left I have reviewed things in [facility]. There is very little information on this patient. She apparently follows in current total clinic which we don't have information from. She has mentioned already been to a [facility]. She has a history of hypothyroidism, nephrolithiasis arthritis and has had a previous mastectomy. 04/13/16; patient's x-ray was normal. She is already  been to see Dr. Delana Meyer vascular surgery. Her arterial exam was from November 2016 this showed a left ABI of 0.62 her right of 0.76. Her duplex ultrasound of the left leg showed biphasic waves in the common femoral and distal femoral artery however monophasic waves in the superficial femoral artery proximal and mid biphasic it distal. 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 The patient tells me that she has pain at night when she tries to lie down which is improved by getting up and sitting in the chair this sounds like claudication at rest. Her wounds are on the left medial malleolus and the dorsal fifth toe small punched out wounds that are right on bone. We use Santyl last week 04/20/16: nurse informed me pt has declined evaluation for significant PAD. she denies systemic s/s of infections. 04/27/16;; the patient has had noninvasive arterial studies done in November 2016. ABI and the left was 0.62. Monophasic waves at the superficial femoral artery. Occluded to the posterior tibial artery. So had greater than 50% stenosis of the right superficial femoral and greater than 50% stenosis of the left superficial femoral artery. She had bilateral tibial peroneal artery disease. Both of the wounds on the left fifth toe and left lateral malleolus have been present for more than a year. They have been to see vein and vascular. The patient has some pain but miraculously I think the wounds have largely been stable. No evidence of infection 05/04/16; she goes for a noninvasive study tomorrow and then sees Dr. Fletcher Anon on Monday. By the time she  is here next week we should have a better picture of whether something can be done with regards to her arterial flow. We continue to have ischemic-looking wounds on the left fifth toe and left lateral malleolus. 05/10/16; the patient went for her arterial studies and saw Dr. Fletcher Anon. As predicted she is felt to have critical limb ischemia. The feeling is that she has occlusion of the SFA. The feeling would she would be a candidate for a stent to the SFA. The patient did not make a decision to proceed with the procedure and she is here with family members to discuss this me today. He shouldn't has a lot of pain and cannot sleep and rest well at night per her family. 05/24/16; the patient went and had a complex revascularization/angioplasty of the left superficial femoral artery followed by drug-coated balloon angioplasty and spots stenting. She tolerated the procedure well. She was recommended for dual antiplatelet drugs with Plavix and aspirin for at least a month. She went yesterday for I believe follow-up serial Dopplers and ABIs although I don't see these results. The patient is unfortunately complaining of a lot of pain in her bilateral lower legs below the knees from the ankle to the knees. She apparently was prescribed lidocaine and apparently put this on her legs instead of over the wound areas. This may have something to do with it however there is a lot of edema in her bilateral legs I was able to find her arterial studies from yesterday. The left ABI has improved up to 0.66 post left SFA stent. The bilateral great toe indices remain abnormal with the left being in the 0.25 range. Duplex ultrasound showed her left SFA stent is patent monophasic waveforms persist in the left leg 06/07/16; continued punched-out areas over the dorsal left fifth toe and left lateral malleolus. No major improvement 06/28/16; the areas over her dorsal left fifth toe and left lateral malleolus are covered  in surface  slough we are using Iodoflex 07/05/16. We have been using Iodoflex for 2-3 weeks now. I have not been debridement is because of pain AZUL, COFFIE ([id number]) 07/19/16 currently we have been using Iodoflex for roughly the past month. Previously we were unable to debride due to pain although today patient states that the pain is not nearly as severe as it has been in the past. In fact she rates this to be a 1 out of 10 and it worse to a to 10 with palpation of the wound. All and all her and her daughter feel like this is actually doing steadily better at this point in time. She is pleased with progress and we have been seeing her every 2 weeks. 08/02/16; this is a delightful 84 year old woman I have not seen in over a month. She has arterial insufficiency wounds remaining over the left lateral malleolus and the left fifth toe. With the help of Dr. Fletcher Anon we are able to get her revascularized on the left. The 2 wounds on the left have been making good progress and are definitely smaller especially the area over the left lateral malleolus. She is certainly in a lot less pain than she used to be although I still think there is some claudication type pain. Unfortunately she is developed a area on the right lateral foot which is a small open area but I think is threatened. I suspect a small ischemic areas well. Also on her right fifth toe there is an area that is not open but looks as though it is receiving too much pressure from footwear. Finally an area over her right malleolus although I don't think this is on its way to anything ominous 08/17/16 patient presents today for follow up evaluation she tells me that she is really doing fairly well from a pain standpoint compared to where she has been previous. She is tolerating the dressing changes without any complication. She continues to have discharge and drainage however. 08-30-16 Ms. Demattia presents today with her daughter, she states that she  continues to have intermittent shooting pains to the left lateral fifth toe at this site of seems to be a healed ulcer. She denies any other issues that her wound related since her last appointment. Her daughter states that she is in need of a tramadol refill at this time as she uses half a tablet at at bedtime to aid in sleep due to foot pain. Iodoflex has been used on all wounds in previous dressing changes. 09/13/16; the patient has ischemic wounds in her feet. The area over the left lateral malleolus is healed and the area over the left fifth toe looks improved.. She has an area on the lateral aspect of her right foot which is a small but deep wound. Finally she has a new open area on the medial aspect of the left medial malleolus. This is superficial 09/27/16; the area over the left lateral malleolus remains healed. The area over the left fifth toe has a surface and she states the pain is better but I don't think this is completely closed. She has an area on the lateral aspect of her right foot is a small but deep wound. The new open area on the medial aspect of the left ankle was closed from last time. 10/18/16; the area over her left lateral malleolus remains healed. The area over the left fifth toe has a surface over the top of this however there is no overt open area.  Given the underlying issues of severe PAD and continued pain in the toe I would think it would be unlikely this is truly healed however I am not planning to debridement this area. The area on the right lateral foot which was a more recent wound is a small punched out painful area again has significant surface slough and nonviable tissue. It is clear the patient still has claudication type pain however she remains functional. I have been giving her tramadol when necessary and that seems to help a lot with her pain the patient follows up with Dr. Fletcher Anon on January 18/18 11/01/16; patient missed her follow-up with Dr. Fletcher Anon last week  due to a snow day. Follow-up is now on February 15. The areas on the right lateral malleolus and dorsal right fifth toe remain closed over. The fifth toe was tentative is there is a surface eschar however I'm not going to disturb this. Therefore, her only open areas on the right lateral foot. This is a small punched out area. We have been using Prisma. I'm quite convinced this is an ischemic wound 11/15/16; patient has follow-up with Dr. Fletcher Anon on February 15. She has no open wound on the left leg and doesn't really complain of claudication that I can determine from talking to her. However on the right leg she clearly has some degree of claudication. The remaining open wound is on the right lateral foot. We have been using Iodosorb ointment 11/29/16; patient saw Dr. Fletcher Anon on 11/24/16. His comment is that her wound on her right lateral foot seems to be improving with local wound care. She has known significant right SFA disease. Her lower extremity Doppler will be repeated and according to the patient's daughter that appointment is on March 15. He is left with the thought that endovascular intervention in the right SFA might be necessary. The patient has quite a bit of pain especially at night related to the wound in her right foot. We changed her to Iodosorb ointment last week 12/13/16; this is a patient I follow every 2 weeks largely on a palliative approach at this point about ischemic wounds currently in the right foot. Initially she had them on her left lateral malleolus and left fifth toe. The area on the left lateral malleolus healed and the fifth toe has a surface eschar that I have elected not to remove. Both of these improved after revascularization by Dr. Fletcher Anon. We have been using Iodosorb to the right lateral foot not much change here. 12/27/16; the patient had her arterial studies. This showed known bilateral SFA disease. Stable right ABI 0.5 to stable left ABI at 0.7 to the did not do it TBI on  the right it was 0.61 on the left she has a stent in the long segment of her left SFA from 05/18/16. All of her wounds are somewhat better. She states her pain is better in the right foot is improved. 01/10/17- patient is here for follow-up dilation of her right lateral foot ulcer. She has been tolerating Iodosorb. She is voices no complaints or concerns 01/24/17; small ischemic wound on right lateral foot. still non viable cover. follows with Dr. Fletcher Anon on 4/30. No open area on left foot Baril, Estill Bamberg ([id number]) 02/07/17 doing well and states she is pain free. Saw Dr. Fletcher Anon who said she is doing well and has "50% flow in the right leg and 70% in the left. According to her daughter he does not wish to do any further interventions 02/21/17; small  ischemic wound on the right lateral foot. Per her daughter and the patient she has no open wound on the left foot and ankle which were her initial presenting wounds. still has claudication type pain at night she is been to see interventional cardiology who does not feel that she has a need for intervention on the right leg at present. She still has claudication type pain in the right leg which she manages at night with when necessary tramadol that I have prescribed 03/14/17; small ischemic wound on the right lateral foot. They've been dressing this at home with Iodosorb ointment. The patient does not complain of any pain. The wound has a surface eschar over it and there is really no visible open area. This is similar to how the areas on the left leg healed 04/11/17; the small ischemic wound on her right lateral foot is finally closed over. Small amount of eschar over the surface however I did not attempt to remove this. The patient claims to be asymptomatic she is not having any claudication that I'm able to elicit although her activity is limited Readmission: 10/23/17 on evaluation today patient appears to be doing somewhat poorly in regard to her right foot where  she has two ulcers at this point. The worst is on the lateral aspect of her foot medial first metatarsal region is not nearly as bad. With that being said these did arise seemingly out of nowhere she has previously had a similar issue in the past these have always been ischemic wounds due to arterial insufficiency. She does see Dr. Fletcher Anon as her vascular specialist and it has been noted that her ABI's are somewhat low. She actually has a repeat evaluation coming up this March 2019 to reevaluate her blood flow. On the last check 12/22/16 it was noted that patient had stable ABI's in the lower moderate abnormal range in regard to the right in regard to the left and the upper moderate at normal range. Patient is having some discomfort though nothing severe at this point in time. She is seen with her daughter and son-in-law today. No fevers, chills, nausea, or vomiting noted at this time. 10/30/17 on evaluation today patient appears to be doing well in regard to her wounds. The right lateral foot wound appears to show signs of cleaning up nicely there is granulation noted underneath that the bed of the wound although there still some [facility] covering. Nonetheless she still has a lot of discomfort and I really do not want to proceed with debridement due to the fact that she does have so much discomfort. Good news is the tramadol has been helping her at bedtime she only takes this once a day and that has been extremely beneficial. Unfortunately she does not have a primary right now as she is awaiting a new one which is the reason that I am prescribing the tramadol. Nonetheless I'm pleased with how things seem to be progressing in regard to her ulcers. 11/06/17 on evaluation today patient appears to be doing decently well as far as maintaining in regard to her ulcers. Unfortunately I did review her arterial study which shows that she has on the right arterial obstruction involving the superficial femoral and  popliteal artery as well is the superficial femoral artery with a high grade stenosis versus inclusion. Collateral flow noted in the distal portion of the SFA. Severe progression is noted compared to previous study. Unfortunately being the patient's wounds do not seem to be healing as appropriately and  quickly as we would like I do believe this is likely a result of poor vascular flow to the right lower extremity. This was discussed with patient and her daughter during the office visit today. 11/14/17 on evaluation today patient continues to have a fairly stalled ulcer in regard to her to ulcers on the right lower extremity. She obviously did have significant findings on her arterial study and I do believe this is again due to poor vascular flow. She does have an appointment later this afternoon with her vascular specialist for further evaluation to see if there any recommendations at that point. With that being said she continues to have discomfort which I do believe is arterial in nature in regard to insufficiency. 11/21/17 on evaluation today patient appears to be doing fairly well in regard to the appearance of her wounds other than the fact that she does have erythema surrounding the wound bed at this point in fact this encompasses the majority of the dorsal surface of her right foot. There is additional granulation noted today that was not noted previously on evaluation and again if that were alone were the findings that we were seeing I would be very happy with the current progress. However unfortunately she is having the erythema which concerns me for the possibility of infection although the other possibility is that she could just be having a local reaction due to the I resort. In the past she never had this issue however when she use this for the root left foot which makes me again come back to my concern about infection. 11/28/17 on evaluation today patient is status post having had an  angiogram performed on 11/22/17 where it was noted that she had severe disease involving the TP trunk and proximal peroneal artery. Subsequently she underwent successful atherectomy and drug coated balloon angioplasty to the right SFA, TP trunk, and proximal peroneal artery. Patient stated only of has 10% residual stenosis and overall the procedure at the time was both tolerated well as well as considered a success. This was performed by Dr. Sophronia Simas. Patient was subsequently discharged home on 11/23/17 from the hospital. Overall she states that her leg pain is not nearly as severe as what it was previous which is excellent news. She also has been sleeping with her TASHANDA, FUHRER ([id number]) leg in the bed previously she was hanging this over the edge due to the pain. She also seems to have much better blood flow on evaluation today. 12/05/17 on evaluation today patient appears to be doing very well in regard to her quite lateral great toe as well as the right lateral foot ulcer. She has excellent blood flow and great capillary refill noted on evaluation today which is excellent news. With that being said she does have discomfort but mainly at the site of the wounds especially lateral foot wound but nothing like what she was experiencing previously. This is rated to be a 3/10 at most. She is seen with her daughter present. 12/12/17 on evaluation today patient appears to be doing very well in regard to her right foot and right medial great toe ulcers. She has been tolerating the dressing changes without complication. She doesn't seem to have any significant discomfort which is also good news. Overall she has been doing excellent since her vascular intervention in my opinion. She actually does have follow-up later today with vascular for a repeat arterial study to ensure everything seems to be doing well following her surgery. 12/19/17 on evaluation today  patient appears to be doing excellent in regard to  her lower extremity ulcers. In fact the right medial great toe ulcer has completely resolved at this point which is excellent news. The right lateral foot ulcer seems to be showing signs of improving week by week and obviously this is good news as well. Overall I'm very pleased with the progress that she has made up to this point. She does have some Slough covering the lateral foot wound which does require sharp debridement today. I did review her vascular note from Dr. Dellis Filbert and it appears that everything seems to be doing well from a vascular standpoint and the patient's foot continues to be warm and appears well vascularized. Electronic Signature(s) Signed: 12/20/2017 12:09:36 AM By: Worthy Keeler PA-C Entered By: Worthy Keeler on 12/19/2017 23:24:52 [name] ([id number]) -------------------------------------------------------------------------------- Physical Exam Details Patient Name: [name] Date of Service: 12/19/2017 11:15 AM Medical Record Number: [id number] Patient Account Number: [id number] Date of Birth/Sex: March 24, 1919 (84 y.o. Female) Treating RN: Carolyne Fiscal, Debi Primary Care Provider: PATIENT, NO Other Clinician: Referring Provider: Referral, Self Treating Provider/Extender: STONE III, HOYT Weeks in Treatment: 8 Constitutional Well-nourished and well-hydrated in no acute distress. Respiratory normal breathing without difficulty. clear to auscultation bilaterally. Cardiovascular regular rate and rhythm with normal S1, S2. Psychiatric this patient is able to make decisions and demonstrates good insight into disease process. Alert and Oriented x 3. pleasant and cooperative. Notes Patient's right lateral foot ulcer did have slough covering and required sharp debridement today which she tolerated with a small amount of discomfort but overall nothing too significant. Post debridement the wound bed appear to be doing much better there does appear to be  excellent blood flow at this point which is great news. Electronic Signature(s) Signed: 12/20/2017 12:09:36 AM By: Worthy Keeler PA-C Entered By: Worthy Keeler on 12/19/2017 23:25:26 [name] ([id number]) -------------------------------------------------------------------------------- Physician Orders Details Patient Name: [name] Date of Service: 12/19/2017 11:15 AM Medical Record Number: [id number] Patient Account Number: [id number] Date of Birth/Sex: Jul 24, 1919 (84 y.o. Female) Treating RN: Carolyne Fiscal, Debi Primary Care Provider: PATIENT, NO Other Clinician: Referring Provider: Referral, Self Treating Provider/Extender: STONE III, HOYT Weeks in Treatment: 8 Verbal / Phone Orders: Yes Clinician: Pinkerton, Debi Read Back and Verified: Yes Diagnosis Coding Wound Cleansing Wound #7 Right,Dorsal Foot o Clean wound with Normal Saline. o Cleanse wound with mild soap and water o May Shower, gently pat wound dry prior to applying new dressing. Anesthetic (add to Medication List) Wound #7 Right,Dorsal Foot o Topical Lidocaine 4% cream applied to wound bed prior to debridement (In Clinic Only). Skin Barriers/Peri-Wound Care Wound #7 Right,Dorsal Foot o Skin Prep Primary Wound Dressing Wound #7 Right,Dorsal Foot o Prisma Ag - moisten with saline Secondary Dressing Wound #7 Right,Dorsal Foot o Dry Gauze o Tegaderm Dressing Change Frequency Wound #7 Right,Dorsal Foot o Change dressing every day. Follow-up Appointments Wound #7 Right,Dorsal Foot o Return Appointment in 2 weeks. Edema Control Wound #7 Right,Dorsal Foot o Elevate legs to the level of the heart and pump ankles as often as possible Off-Loading Wound #7 Right,Dorsal Foot o Turn and reposition every 2 hours Additional Orders / Instructions KERYN, NESSLER ([id number]) Wound #7 Right,Dorsal Foot o Increase protein intake. o Other: - Vitamin C, Zinc Patient  Medications Allergies: codeine Notifications Medication Indication Start End lidocaine DOSE 1 - topical 4 % cream - 1 cream topical Electronic Signature(s) Signed: 12/20/2017 12:09:36 AM By: Worthy Keeler PA-C Signed: 12/20/2017 8:38:15 AM By: Carolyne Fiscal,  Debra Entered By: Alric Quan on 12/19/2017 11:38:02 [name] ([id number]) -------------------------------------------------------------------------------- Prescription 12/19/2017 Patient Name: [name] Provider: Worthy Keeler PA-C Date of Birth: [birth year] NPI#: [id number] Sex: F DEA#: [id number] Phone #: [phone] License #: Patient Address: [facility] [place], [place] [address] [address], [place],  [address] [phone] Allergies codeine Reaction: sick on stomach Severity: Severe Medication Medication: Route: Strength: Form: lidocaine 4 % topical cream topical 4% cream Class: TOPICAL LOCAL ANESTHETICS Dose: Frequency / Time: Indication: 1 1 cream topical Number of Refills: Number of Units: 0 Generic Substitution: Start Date: End Date: One Time Use: Substitution Permitted No Note to Pharmacy: Signature(s): Date(s): Electronic Signature(s) Signed: 12/20/2017 12:09:36 AM By: Worthy Keeler PA-C Signed: 12/20/2017 8:38:15 AM By: Alric Quan Entered By: Alric Quan on 12/19/2017 11:38:04 [name] ([id number]) [name] ([id number]) --------------------------------------------------------------------------------  Problem List Details Patient Name: [name] Date of Service: 12/19/2017 11:15 AM Medical Record Number: [id number] Patient Account Number: [id number] Date of Birth/Sex: 1919-03-06 (84 y.o. 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 10/23/2017 Yes part of foot I70.232 Atherosclerosis of native arteries of right leg with ulceration of calf 10/23/2017 Yes L97.512 Non-pressure chronic ulcer of other part of right foot with fat layer 10/23/2017 Yes exposed [place] (primary) hypertension 10/24/2017 Yes Inactive Problems Resolved Problems Electronic Signature(s) Signed: 12/20/2017 12:09:36 AM By: Worthy Keeler PA-C Entered By: Worthy Keeler on 12/19/2017 23:24:03 [name] ([id number]) -------------------------------------------------------------------------------- Progress Note Details Patient Name: [name] Date of Service: 12/19/2017 11:15 AM Medical Record Number: [id number] Patient Account Number: [id number] Date of Birth/Sex: Dec 22, 1918 (84 y.o. Female) Treating RN: Carolyne Fiscal, Debi Primary Care Provider: PATIENT, NO Other Clinician: Referring Provider: Referral, Self Treating Provider/Extender: STONE III, HOYT Weeks in Treatment: 8 Subjective Chief Complaint Information obtained from Patient Right foot Ulcers History of Present Illness (HPI) 04/06/16; this is a 84 year old woman who arrives accompanied by 2 daughters for a wound on her left ankle and her left fifth toe. These have apparently been present for a year. I'm not quite certain how she came to this clinic however she was being followed by Sharlotte Alamo her podiatrist for these wounds. She was also referred to [facility] and they apparently did a test presumably arterial studies although we don't have any of these results and we couldn't get through to the office today. The family but has been applying a combination of Bactroban and a light bandage and perhaps more recently Silvadene cream. She did have an x-ray of the foot roughly 6  months ago at the podiatry office the family was unaware that if there were any abnormalities. Apparently they have not seen any healing here. Our intake nurse noted a slight skin tear on the right anterior lower leg. Nobody seemed aware of this. ABIs calculated in this clinic was 0.3 on the right and 0.4 on the left I have reviewed things in [facility]. There is very little information on this patient. She apparently follows in current total clinic which we don't have information from. She has mentioned already been to a [facility]. She has a history of hypothyroidism, nephrolithiasis arthritis and has had a previous mastectomy. 04/13/16; patient's x-ray was normal.  She is already been to see Dr. Delana Meyer vascular surgery. Her arterial exam was from November 2016 this showed a left ABI of 0.62 her right of 0.76. Her duplex ultrasound of the left leg showed biphasic waves in the common femoral and distal femoral artery however monophasic waves in the superficial femoral artery proximal and mid biphasic it distal. 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 The patient tells me that she has pain at night when she tries to lie down which is improved by getting up and sitting in the chair this sounds like claudication at rest. Her wounds are on the left medial malleolus and the dorsal fifth toe small punched out wounds that are right on bone. We use Santyl last week 04/20/16: nurse informed me pt has declined evaluation for significant PAD. she denies systemic s/s of infections. 04/27/16;; the patient has had noninvasive arterial studies done in November 2016. ABI and the left was 0.62. Monophasic waves at the superficial femoral artery. Occluded to the posterior tibial artery. So had greater than 50% stenosis of the right superficial femoral and greater than 50% stenosis of the left superficial femoral artery. She had bilateral tibial peroneal artery disease. Both of the wounds on the left fifth toe and left  lateral malleolus have been present for more than a year. They have been to see vein and vascular. The patient has some pain but miraculously I think the wounds have largely been stable. No evidence of infection 05/04/16; she goes for a noninvasive study tomorrow and then sees Dr. Fletcher Anon on Monday. By the time she is here next week we should have a better picture of whether something can be done with regards to her arterial flow. We continue to have ischemic-looking wounds on the left fifth toe and left lateral malleolus. 05/10/16; the patient went for her arterial studies and saw Dr. Fletcher Anon. As predicted she is felt to have critical limb ischemia. The feeling is that she has occlusion of the SFA. The feeling would she would be a candidate for a stent to the SFA. The patient did not make a decision to proceed with the procedure and she is here with family members to discuss this me today. He shouldn't has a lot of pain and cannot sleep and rest well at night per her family. 05/24/16; the patient went and had a complex revascularization/angioplasty of the left superficial femoral artery followed by drug-coated balloon angioplasty and spots stenting. She tolerated the procedure well. She was recommended for dual antiplatelet drugs with Plavix and aspirin for at least a month. She went yesterday for I believe follow-up serial Dopplers and ABIs although I don't see these results. The patient is unfortunately complaining of a lot of pain in her bilateral lower legs below the knees from the ankle to the knees. She apparently was prescribed lidocaine and apparently put this on her legs instead of over the wound areas. This may have something to do with it however there is a lot of edema in her bilateral legs I was able to find her arterial studies from yesterday. The left ABI has improved up to 0.66 post left SFA stent. The bilateral Penley, Cammie ([id number]) great toe indices remain abnormal with the left  being in the 0.25 range. Duplex ultrasound showed her left SFA stent is patent monophasic waveforms persist in the left leg 06/07/16; continued punched-out areas over the dorsal left fifth toe and left lateral malleolus. No major improvement 06/28/16; the areas over her dorsal left fifth  toe and left lateral malleolus are covered in surface slough we are using Iodoflex 07/05/16. We have been using Iodoflex for 2-3 weeks now. I have not been debridement is because of pain 07/19/16 currently we have been using Iodoflex for roughly the past month. Previously we were unable to debride due to pain although today patient states that the pain is not nearly as severe as it has been in the past. In fact she rates this to be a 1 out of 10 and it worse to a to 10 with palpation of the wound. All and all her and her daughter feel like this is actually doing steadily better at this point in time. She is pleased with progress and we have been seeing her every 2 weeks. 08/02/16; this is a delightful 84 year old woman I have not seen in over a month. She has arterial insufficiency wounds remaining over the left lateral malleolus and the left fifth toe. With the help of Dr. Fletcher Anon we are able to get her revascularized on the left. The 2 wounds on the left have been making good progress and are definitely smaller especially the area over the left lateral malleolus. She is certainly in a lot less pain than she used to be although I still think there is some claudication type pain. Unfortunately she is developed a area on the right lateral foot which is a small open area but I think is threatened. I suspect a small ischemic areas well. Also on her right fifth toe there is an area that is not open but looks as though it is receiving too much pressure from footwear. Finally an area over her right malleolus although I don't think this is on its way to anything ominous 08/17/16 patient presents today for follow up evaluation  she tells me that she is really doing fairly well from a pain standpoint compared to where she has been previous. She is tolerating the dressing changes without any complication. She continues to have discharge and drainage however. 08-30-16 Ms. Marrs presents today with her daughter, she states that she continues to have intermittent shooting pains to the left lateral fifth toe at this site of seems to be a healed ulcer. She denies any other issues that her wound related since her last appointment. Her daughter states that she is in need of a tramadol refill at this time as she uses half a tablet at at bedtime to aid in sleep due to foot pain. Iodoflex has been used on all wounds in previous dressing changes. 09/13/16; the patient has ischemic wounds in her feet. The area over the left lateral malleolus is healed and the area over the left fifth toe looks improved.. She has an area on the lateral aspect of her right foot which is a small but deep wound. Finally she has a new open area on the medial aspect of the left medial malleolus. This is superficial 09/27/16; the area over the left lateral malleolus remains healed. The area over the left fifth toe has a surface and she states the pain is better but I don't think this is completely closed. She has an area on the lateral aspect of her right foot is a small but deep wound. The new open area on the medial aspect of the left ankle was closed from last time. 10/18/16; the area over her left lateral malleolus remains healed. The area over the left fifth toe has a surface over the top of this however there is no  overt open area. Given the underlying issues of severe PAD and continued pain in the toe I would think it would be unlikely this is truly healed however I am not planning to debridement this area. The area on the right lateral foot which was a more recent wound is a small punched out painful area again has significant surface slough and  nonviable tissue. It is clear the patient still has claudication type pain however she remains functional. I have been giving her tramadol when necessary and that seems to help a lot with her pain the patient follows up with Dr. Fletcher Anon on January 18/18 11/01/16; patient missed her follow-up with Dr. Fletcher Anon last week due to a snow day. Follow-up is now on February 15. The areas on the right lateral malleolus and dorsal right fifth toe remain closed over. The fifth toe was tentative is there is a surface eschar however I'm not going to disturb this. Therefore, her only open areas on the right lateral foot. This is a small punched out area. We have been using Prisma. I'm quite convinced this is an ischemic wound 11/15/16; patient has follow-up with Dr. Fletcher Anon on February 15. She has no open wound on the left leg and doesn't really complain of claudication that I can determine from talking to her. However on the right leg she clearly has some degree of claudication. The remaining open wound is on the right lateral foot. We have been using Iodosorb ointment 11/29/16; patient saw Dr. Fletcher Anon on 11/24/16. His comment is that her wound on her right lateral foot seems to be improving with local wound care. She has known significant right SFA disease. Her lower extremity Doppler will be repeated and according to the patient's daughter that appointment is on March 15. He is left with the thought that endovascular intervention in the right SFA might be necessary. The patient has quite a bit of pain especially at night related to the wound in her right foot. We changed her to Iodosorb ointment last week 12/13/16; this is a patient I follow every 2 weeks largely on a palliative approach at this point about ischemic wounds currently in the right foot. Initially she had them on her left lateral malleolus and left fifth toe. The area on the left lateral malleolus healed and the fifth toe has a surface eschar that I have  elected not to remove. Both of these improved after revascularization by Dr. Fletcher Anon. We have been using Iodosorb to the right lateral foot not much change here. 12/27/16; the patient had her arterial studies. This showed known bilateral SFA disease. Stable right ABI 0.5 to stable left ABI at 0.7 to the did not do it TBI on the right it was 0.61 on the left she has a stent in the long segment of her left SFA from Callery, Estill Bamberg ([id number]) 05/18/16. All of her wounds are somewhat better. She states her pain is better in the right foot is improved. 01/10/17- patient is here for follow-up dilation of her right lateral foot ulcer. She has been tolerating Iodosorb. She is voices no complaints or concerns 01/24/17; small ischemic wound on right lateral foot. still non viable cover. follows with Dr. Fletcher Anon on 4/30. No open area on left foot 02/07/17 doing well and states she is pain free. Saw Dr. Fletcher Anon who said she is doing well and has "50% flow in the right leg and 70% in the left. According to her daughter he does not wish to do any further  interventions 02/21/17; small ischemic wound on the right lateral foot. Per her daughter and the patient she has no open wound on the left foot and ankle which were her initial presenting wounds. still has claudication type pain at night she is been to see interventional cardiology who does not feel that she has a need for intervention on the right leg at present. She still has claudication type pain in the right leg which she manages at night with when necessary tramadol that I have prescribed 03/14/17; small ischemic wound on the right lateral foot. They've been dressing this at home with Iodosorb ointment. The patient does not complain of any pain. The wound has a surface eschar over it and there is really no visible open area. This is similar to how the areas on the left leg healed 04/11/17; the small ischemic wound on her right lateral foot is finally closed over. Small  amount of eschar over the surface however I did not attempt to remove this. The patient claims to be asymptomatic she is not having any claudication that I'm able to elicit although her activity is limited Readmission: 10/23/17 on evaluation today patient appears to be doing somewhat poorly in regard to her right foot where she has two ulcers at this point. The worst is on the lateral aspect of her foot medial first metatarsal region is not nearly as bad. With that being said these did arise seemingly out of nowhere she has previously had a similar issue in the past these have always been ischemic wounds due to arterial insufficiency. She does see Dr. Fletcher Anon as her vascular specialist and it has been noted that her ABI's are somewhat low. She actually has a repeat evaluation coming up this March 2019 to reevaluate her blood flow. On the last check 12/22/16 it was noted that patient had stable ABI's in the lower moderate abnormal range in regard to the right in regard to the left and the upper moderate at normal range. Patient is having some discomfort though nothing severe at this point in time. She is seen with her daughter and son-in-law today. No fevers, chills, nausea, or vomiting noted at this time. 10/30/17 on evaluation today patient appears to be doing well in regard to her wounds. The right lateral foot wound appears to show signs of cleaning up nicely there is granulation noted underneath that the bed of the wound although there still some [facility] covering. Nonetheless she still has a lot of discomfort and I really do not want to proceed with debridement due to the fact that she does have so much discomfort. Good news is the tramadol has been helping her at bedtime she only takes this once a day and that has been extremely beneficial. Unfortunately she does not have a primary right now as she is awaiting a new one which is the reason that I am prescribing the tramadol. Nonetheless I'm pleased  with how things seem to be progressing in regard to her ulcers. 11/06/17 on evaluation today patient appears to be doing decently well as far as maintaining in regard to her ulcers. Unfortunately I did review her arterial study which shows that she has on the right arterial obstruction involving the superficial femoral and popliteal artery as well is the superficial femoral artery with a high grade stenosis versus inclusion. Collateral flow noted in the distal portion of the SFA. Severe progression is noted compared to previous study. Unfortunately being the patient's wounds do not seem to be healing  as appropriately and quickly as we would like I do believe this is likely a result of poor vascular flow to the right lower extremity. This was discussed with patient and her daughter during the office visit today. 11/14/17 on evaluation today patient continues to have a fairly stalled ulcer in regard to her to ulcers on the right lower extremity. She obviously did have significant findings on her arterial study and I do believe this is again due to poor vascular flow. She does have an appointment later this afternoon with her vascular specialist for further evaluation to see if there any recommendations at that point. With that being said she continues to have discomfort which I do believe is arterial in nature in regard to insufficiency. 11/21/17 on evaluation today patient appears to be doing fairly well in regard to the appearance of her wounds other than the fact that she does have erythema surrounding the wound bed at this point in fact this encompasses the majority of the dorsal surface of her right foot. There is additional granulation noted today that was not noted previously on evaluation and again if that were alone were the findings that we were seeing I would be very happy with the current progress. However unfortunately she is having the erythema which concerns me for the possibility of  infection although the other possibility is that she could just be having a local reaction due to the I resort. In the past she never had this issue however when she use this for the root left foot which makes me again come back to my concern about infection. CAMALA, TALWAR ([id number]) 11/28/17 on evaluation today patient is status post having had an angiogram performed on 11/22/17 where it was noted that she had severe disease involving the TP trunk and proximal peroneal artery. Subsequently she underwent successful atherectomy and drug coated balloon angioplasty to the right SFA, TP trunk, and proximal peroneal artery. Patient stated only of has 10% residual stenosis and overall the procedure at the time was both tolerated well as well as considered a success. This was performed by Dr. Sophronia Simas. Patient was subsequently discharged home on 11/23/17 from the hospital. Overall she states that her leg pain is not nearly as severe as what it was previous which is excellent news. She also has been sleeping with her leg in the bed previously she was hanging this over the edge due to the pain. She also seems to have much better blood flow on evaluation today. 12/05/17 on evaluation today patient appears to be doing very well in regard to her quite lateral great toe as well as the right lateral foot ulcer. She has excellent blood flow and great capillary refill noted on evaluation today which is excellent news. With that being said she does have discomfort but mainly at the site of the wounds especially lateral foot wound but nothing like what she was experiencing previously. This is rated to be a 3/10 at most. She is seen with her daughter present. 12/12/17 on evaluation today patient appears to be doing very well in regard to her right foot and right medial great toe ulcers. She has been tolerating the dressing changes without complication. She doesn't seem to have any significant discomfort which is also  good news. Overall she has been doing excellent since her vascular intervention in my opinion. She actually does have follow-up later today with vascular for a repeat arterial study to ensure everything seems to be doing well following her surgery.  12/19/17 on evaluation today patient appears to be doing excellent in regard to her lower extremity ulcers. In fact the right medial great toe ulcer has completely resolved at this point which is excellent news. The right lateral foot ulcer seems to be showing signs of improving week by week and obviously this is good news as well. Overall I'm very pleased with the progress that she has made up to this point. She does have some Slough covering the lateral foot wound which does require sharp debridement today. I did review her vascular note from Dr. Dellis Filbert and it appears that everything seems to be doing well from a vascular standpoint and the patient's foot continues to be warm and appears well vascularized. Patient History Information obtained from Patient. Family History Diabetes - Mother, Heart Disease - Siblings, Stroke - Siblings, No family history of Cancer, Hereditary Spherocytosis, Hypertension, Kidney Disease, Lung Disease, Seizures, Thyroid Problems, Tuberculosis. Social History Never smoker, Marital Status - Widowed, Alcohol Use - Never, Drug Use - No History, Caffeine Use - Daily. Review of Systems (ROS) Constitutional Symptoms (General Health) Denies complaints or symptoms of Fever, Chills. Respiratory The patient has no complaints or symptoms. Cardiovascular The patient has no complaints or symptoms. Psychiatric The patient has no complaints or symptoms. Objective Landin, Halle ([id number]) Constitutional Well-nourished and well-hydrated in no acute distress. Vitals Time Taken: 11:23 AM, Height: 64 in, Weight: 156 lbs, BMI: 26.8, Temperature: 97.8 F, Pulse: 81 bpm, Respiratory Rate: 16 breaths/min, Blood Pressure: 157/52  mmHg. Respiratory normal breathing without difficulty. clear to auscultation bilaterally. Cardiovascular regular rate and rhythm with normal S1, S2. Psychiatric this patient is able to make decisions and demonstrates good insight into disease process. Alert and Oriented x 3. pleasant and cooperative. General Notes: Patient's right lateral foot ulcer did have slough covering and required sharp debridement today which she tolerated with a small amount of discomfort but overall nothing too significant. Post debridement the wound bed appear to be doing much better there does appear to be excellent blood flow at this point which is great news. Integumentary (Hair, Skin) Wound #6 status is Healed - Epithelialized. Original cause of wound was Gradually Appeared. The wound is located on the [employer]. The wound measures 0cm length x 0cm width x 0cm depth; 0cm^2 area and 0cm^3 volume. There is no tunneling or undermining noted. There is a none present amount of drainage noted. The wound margin is distinct with the outline attached to the wound base. There is no granulation within the wound bed. There is no necrotic tissue within the wound bed. Periwound temperature was noted as No Abnormality. The periwound has tenderness on palpation. Wound #7 status is Open. Original cause of wound was Gradually Appeared. The wound is located on the Right,Dorsal Foot. The wound measures 0.9cm length x 1cm width x 0.2cm depth; 0.707cm^2 area and 0.141cm^3 volume. There is Fat Layer (Subcutaneous Tissue) Exposed exposed. There is no tunneling or undermining noted. There is a large amount of serous drainage noted. The wound margin is distinct with the outline attached to the wound base. There is medium (34-66%) pink granulation within the wound bed. There is a medium (34-66%) amount of necrotic tissue within the wound bed including Adherent Slough. The periwound skin appearance exhibited: Induration,  Maceration, Erythema. The surrounding wound skin color is noted with erythema which is circumferential. Periwound temperature was noted as No Abnormality. The periwound has tenderness on palpation. 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 There was a Skin/Subcutaneous Tissue Debridement ([id number]) debridement with total area of 0.9 sq cm performed by STONE III, HOYT E., PA-C. with the following instrument(s): Curette to remove Viable and Non-Viable tissue/material including Exudate, Fibrin/Slough, and Subcutaneous after achieving pain control using Lidocaine 4% Topical Solution. A time out was conducted at 11:34, prior to the start of the procedure. A Minimum amount of bleeding was controlled with Pressure. The procedure was tolerated well with a pain level of 0 throughout and a pain level of 0 following the procedure. Post Debridement Measurements: 0.9cm length x 1cm width x 0.3cm depth; 0.212cm^3 volume. Character of Wound/Ulcer Post Debridement requires further debridement. Severity of Tissue Post Debridement is: Fat layer exposed. Post procedure Diagnosis Wound #7: Same as Pre-Procedure Plan Wound Cleansing: Wound #7 Right,Dorsal Foot: Clean wound with Normal Saline. Cleanse wound with mild soap and water May Shower, gently pat wound dry prior to applying new dressing. Anesthetic (add to Medication List): Wound #7 Right,Dorsal Foot: Topical Lidocaine 4% cream applied to wound  bed prior to debridement (In Clinic Only). Skin Barriers/Peri-Wound Care: Wound #7 Right,Dorsal Foot: Skin Prep Primary Wound Dressing: Wound #7 Right,Dorsal Foot: Prisma Ag - moisten with saline Secondary Dressing: Wound #7 Right,Dorsal Foot: Dry Gauze Tegaderm Dressing Change Frequency: Wound #7 Right,Dorsal Foot: Change dressing every day. Follow-up Appointments: Wound #7 Right,Dorsal Foot: Return Appointment in 2 weeks. Edema Control: Wound #7 Right,Dorsal Foot: Elevate legs to the level of the heart and pump ankles as often as possible Off-Loading: Wound #7 Right,Dorsal Foot: Turn and reposition every 2 hours Additional Orders / Instructions: Wound #7 Right,Dorsal Foot: Increase protein intake. Other: - Vitamin C, Zinc The following medication(s) was prescribed: lidocaine topical 4 % cream 1 1 cream topical was prescribed at facility [facility], [place] ([id number]) At this time gonna recommend that we continue with the [facility] as this seems to be doing very well. Hopefully she will continue to show signs of improvement week by week. We will see were things stand in one weeks time. Please see above for specific wound care orders. We will see patient for re-evaluation in 1 week(s) here in the clinic. If anything worsens or changes patient will contact our office for additional recommendations. Electronic Signature(s) Signed: 12/20/2017 12:09:36 AM By: Worthy Keeler PA-C Entered By: Worthy Keeler on 12/19/2017 23:26:00 [name] ([id number]) -------------------------------------------------------------------------------- ROS/PFSH Details Patient Name: [name] Date of Service: 12/19/2017 11:15 AM Medical Record Number: [id number] Patient Account Number: [id number] Date of Birth/Sex: October 12, 1918 (84 y.o. Female) Treating RN: Carolyne Fiscal, Debi Primary Care Provider: PATIENT, NO Other Clinician: Referring Provider: Referral, Self Treating Provider/Extender: STONE  III, HOYT Weeks in Treatment: 8 Information Obtained From Patient Wound History Do you currently have one or more open woundso Yes How many open wounds do you currently haveo 1 Approximately how long have you had your woundso 1.5 weeks How have you been treating your wound(s) until nowo bandaide Has your wound(s) ever healed and then re-openedo No Have you had any lab work done in the past montho No Have you tested positive for an antibiotic  resistant organism (MRSA, VRE)o No Have you tested positive for osteomyelitis (bone infection)o No Have you had any tests for circulation on your legso Yes Where [facility] the test [facility] Have you had other problems associated with your woundso Swelling Constitutional Symptoms (General Health) Complaints and Symptoms: Negative for: Fever; Chills Eyes Medical History: Positive for: Cataracts - had surgery Respiratory Complaints and Symptoms: No Complaints or Symptoms Cardiovascular Complaints and Symptoms: No Complaints or Symptoms Medical History: Positive for: Hypertension Musculoskeletal Medical History: Positive for: Osteoarthritis Oncologic Medical History: Negative for: Received Chemotherapy; Received Radiation Psychiatric AMAYRANY, CAFARO ([id number]) Complaints and Symptoms: No Complaints or Symptoms HBO Extended History Items Eyes: Cataracts Immunizations Pneumococcal Vaccine: Received Pneumococcal Vaccination: Yes Implantable Devices Family and Social History Cancer: No; Diabetes: Yes - Mother; Heart Disease: Yes - Siblings; Hereditary Spherocytosis: No; Hypertension: No; Kidney Disease: No; Lung Disease: No; Seizures: No; Stroke: Yes - Siblings; Thyroid Problems: No; Tuberculosis: No; Never smoker; Marital Status - Widowed; Alcohol Use: Never; Drug Use: No History; Caffeine Use: Daily; Financial Concerns: No; Food, Clothing or Shelter Needs: No; Support System Lacking: No; Transportation Concerns: No; Advanced Directives:  No; Patient does not want information on Advanced Directives; Do not resuscitate: No; Living Will: No; Medical Power of Attorney: No Physician Affirmation I have reviewed and agree with the above information. Electronic Signature(s) Signed: 12/20/2017 12:09:36 AM By: Worthy Keeler PA-C Signed: 12/20/2017 8:38:15 AM By: Alric Quan Entered By: Worthy Keeler on 12/19/2017 23:25:14 [name] ([id number]) -------------------------------------------------------------------------------- SuperBill Details Patient Name: [name] Date of Service: 12/19/2017 Medical Record Number: [id number] Patient Account Number: [id number] Date of Birth/Sex: 03-21-1919 (84 y.o. Female) Treating RN: Carolyne Fiscal, Debi Primary Care Provider: PATIENT, NO Other Clinician: Referring Provider: Referral, Self Treating Provider/Extender: STONE III, HOYT Weeks in Treatment: 8 Diagnosis Coding ICD-10 Codes Code Description I70.235 Atherosclerosis of native arteries of right leg with ulceration of other part of foot I70.232 Atherosclerosis of native arteries of right leg with ulceration of calf L97.512 Non-pressure chronic ulcer of other part of right foot with fat layer exposed I10 Essential (primary) hypertension Facility Procedures CPT4 Code: 39767341 Description: 93790 - DEB SUBQ TISSUE 20 SQ CM/< ICD-10 Diagnosis Description L97.512 Non-pressure chronic ulcer of other part of right foot with fat l Modifier: ayer exposed Quantity: 1 Physician Procedures CPT4 Code: 2409735 Description: 32992 - WC PHYS SUBQ TISS 20 SQ CM ICD-10 Diagnosis Description L97.512 Non-pressure chronic ulcer of other part of right foot with fat l Modifier: ayer exposed Quantity: 1 Electronic Signature(s) Signed: 12/20/2017 12:09:36 AM By: Worthy Keeler PA-C Entered By: Worthy Keeler on 12/19/2017 23:26:14

## 2017-12-23 ENCOUNTER — Encounter (HOSPITAL_COMMUNITY): Payer: Self-pay | Admitting: Family Medicine

## 2017-12-23 ENCOUNTER — Ambulatory Visit (HOSPITAL_COMMUNITY)
Admission: EM | Admit: 2017-12-23 | Discharge: 2017-12-23 | Disposition: A | Discharge status: Home / Self Care | Payer: Medicare PPO | Attending: Family Medicine | Admitting: Family Medicine

## 2017-12-23 ENCOUNTER — Other Ambulatory Visit: Payer: Self-pay

## 2017-12-23 DIAGNOSIS — Z853 Personal history of malignant neoplasm of breast: Secondary | ICD-10-CM | POA: Diagnosis not present

## 2017-12-23 DIAGNOSIS — Z87442 Personal history of urinary calculi: Secondary | ICD-10-CM | POA: Insufficient documentation

## 2017-12-23 DIAGNOSIS — Z79899 Other long term (current) drug therapy: Secondary | ICD-10-CM | POA: Diagnosis not present

## 2017-12-23 DIAGNOSIS — I10 Essential (primary) hypertension: Secondary | ICD-10-CM | POA: Diagnosis not present

## 2017-12-23 DIAGNOSIS — Z79891 Long term (current) use of opiate analgesic: Secondary | ICD-10-CM | POA: Diagnosis not present

## 2017-12-23 DIAGNOSIS — N390 Urinary tract infection, site not specified: Secondary | ICD-10-CM | POA: Insufficient documentation

## 2017-12-23 DIAGNOSIS — Z86718 Personal history of other venous thrombosis and embolism: Secondary | ICD-10-CM | POA: Insufficient documentation

## 2017-12-23 DIAGNOSIS — Z91041 Radiographic dye allergy status: Secondary | ICD-10-CM | POA: Diagnosis not present

## 2017-12-23 DIAGNOSIS — L03113 Cellulitis of right upper limb: Secondary | ICD-10-CM

## 2017-12-23 DIAGNOSIS — E039 Hypothyroidism, unspecified: Secondary | ICD-10-CM | POA: Diagnosis not present

## 2017-12-23 DIAGNOSIS — E7800 Pure hypercholesterolemia, unspecified: Secondary | ICD-10-CM | POA: Diagnosis not present

## 2017-12-23 DIAGNOSIS — Z7982 Long term (current) use of aspirin: Secondary | ICD-10-CM | POA: Insufficient documentation

## 2017-12-23 DIAGNOSIS — Z885 Allergy status to narcotic agent status: Secondary | ICD-10-CM | POA: Insufficient documentation

## 2017-12-23 LAB — POCT URINALYSIS DIP (DEVICE)
Bilirubin Urine: NEGATIVE
Glucose, UA: NEGATIVE mg/dL
NITRITE: NEGATIVE
PH: 6 (ref 5.0–8.0)
Protein, ur: 30 mg/dL — AB
Specific Gravity, Urine: 1.02 (ref 1.005–1.030)
UROBILINOGEN UA: 0.2 mg/dL (ref 0.0–1.0)

## 2017-12-23 MED ORDER — CEPHALEXIN 500 MG PO CAPS: 500.0000 mg | ORAL_CAPSULE | Freq: Three times a day (TID) | ORAL | 0 refills | Status: DC

## 2017-12-23 NOTE — ED Triage Notes (Signed)
C/O dysuria without fever, abd pain, back/flank pain x approx 2 wks. Also woke up yesterday with redness and swelling to posterior right hand without known injury.

## 2017-12-23 NOTE — ED Provider Notes (Signed)
[place]   [id number] 12/23/17 Arrival Time: 1200   SUBJECTIVE:  [name] is a 84 y.o. female who presents to the urgent care with complaint of dysuria without fever, abd pain, back/flank pain x approx 2 wks. Also woke up yesterday with redness and swelling to posterior right hand without known injury  Past Medical History:  Diagnosis Date  . Arthritis    "legs" (11/22/2017)  . Breast cancer, right breast ([place])    mastectomy 30 yr ago  . DVT (deep venous thrombosis) ([place])    "found an old clot in her ?left leg when they were doing vascular studies; put her on ASA" (05/18/2016)  . Family history of adverse reaction to anesthesia    "all the family get PONV" (05/18/2016)  . High cholesterol   . Hypertension   . Hypothyroidism   . kidney calculi   . PAD (peripheral artery disease) ([place])   . Pneumonia 1990s  . PONV (postoperative nausea and vomiting)   . Shortness of breath    No family history on file. Social History   Socioeconomic History  . Marital status: Widowed    Spouse name: Not on file  . Number of children: Not on file  . Years of education: Not on file  . Highest education level: Not on file  Social Needs  . Financial resource strain: Not on file  . Food insecurity - worry: Not on file  . Food insecurity - inability: Not on file  . Transportation needs - medical: Not on file  . Transportation needs - non-medical: Not on file  Occupational History  . Not on file  Tobacco Use  . Smoking status: Never Smoker  . Smokeless tobacco: Never Used  Substance and Sexual Activity  . Alcohol use: No  . Drug use: No  . Sexual activity: Not on file  Other Topics Concern  . Not on file  Social History Narrative  . Not on file   Current Meds  Medication Sig  . acetaminophen (TYLENOL) 500 MG tablet Take 1,000 mg by mouth every 6 (six) hours as needed for moderate pain or headache.  Marland Kitchen amLODipine (NORVASC) 5 MG tablet Take 5 mg by mouth at bedtime.     Marland Kitchen aspirin EC 81 MG EC tablet Take 1 tablet (81 mg total) by mouth daily.  Marland Kitchen atorvastatin (LIPITOR) 20 MG tablet Take 1 tablet (20 mg total) by mouth daily at 6 PM.  . cetirizine (ZYRTEC) 10 MG tablet Take 10 mg by mouth daily as needed for allergies.   Marland Kitchen clopidogrel (PLAVIX) 75 MG tablet Take 1 tablet (75 mg total) by mouth daily with breakfast.  . Cyanocobalamin (VITAMIN B-12 PO) Take 1 tablet by mouth every other day.  . levothyroxine (SYNTHROID, LEVOTHROID) 50 MCG tablet Take 50 mcg by mouth daily with breakfast.   . losartan (COZAAR) 50 MG tablet Take 50 mg by mouth at bedtime.   . Multiple Vitamins-Minerals (EYE VITAMINS PO) Take 1 capsule by mouth daily.  Vladimir Faster Glycol-Propyl Glycol (SYSTANE OP) Place 1 drop into both eyes daily as needed (for dry eyes).  . traMADol (ULTRAM) 50 MG tablet Take 50 mg by mouth at bedtime.   . Wound Dressings (PROMOGRAN EX) Apply topically. For foot wound   Allergies  Allergen Reactions  . Codeine Nausea And Vomiting  . Contrast Media [Iodinated Diagnostic Agents] Rash      ROS: As per HPI, remainder of ROS negative.   OBJECTIVE:   Vitals:  12/23/17 1211  BP: (!) 178/74  Pulse: 98  Resp: 20  Temp: (!) 97.2 F (36.2 C)  TempSrc: Oral  SpO2: 96%     General appearance: alert; no distress Eyes: PERRL; EOMI; conjunctiva normal HENT: normocephalic; atraumatic;  oral mucosa normal Neck: supple Back: no CVA tenderness Extremities: no cyanosis or edema; symmetrical with no gross deformities Skin: warm and dry;  Erythematous right MCP joint of index finger with minimal STS or tenderness. Neurologic: normal gait; grossly normal Psychological: alert and cooperative; normal mood and affect      Labs:  Results for orders placed or performed during the hospital encounter of 12/23/17  POCT urinalysis dip (device)  Result Value Ref Range   Glucose, UA NEGATIVE NEGATIVE mg/dL   Bilirubin Urine NEGATIVE NEGATIVE   Ketones, ur TRACE  (A) NEGATIVE mg/dL   Specific Gravity, Urine 1.020 1.005 - 1.030   Hgb urine dipstick SMALL (A) NEGATIVE   pH 6.0 5.0 - 8.0   Protein, ur 30 (A) NEGATIVE mg/dL   Urobilinogen, UA 0.2 0.0 - 1.0 mg/dL   Nitrite NEGATIVE NEGATIVE   Leukocytes, UA LARGE (A) NEGATIVE    Labs Reviewed  POCT URINALYSIS DIP (DEVICE) - Abnormal; Notable for the following components:      Result Value   Ketones, ur TRACE (*)    Hgb urine dipstick SMALL (*)    Protein, ur 30 (*)    Leukocytes, UA LARGE (*)    All other components within normal limits  URINE CULTURE    No results found.     ASSESSMENT & PLAN:  1. Lower urinary tract infectious disease   2. Cellulitis of right upper extremity     Meds ordered this encounter  Medications  . cephALEXin (KEFLEX) 500 MG capsule    Sig: Take 1 capsule (500 mg total) by mouth 3 (three) times daily.    Dispense:  20 capsule    Refill:  0    Reviewed expectations re: course of current medical issues. Questions answered. Outlined signs and symptoms indicating need for more acute intervention. Patient verbalized understanding. After Visit Summary given.    Procedures:      [name] Haber, MD 12/23/17 1300

## 2017-12-24 LAB — URINE CULTURE

## 2017-12-25 ENCOUNTER — Telehealth (HOSPITAL_COMMUNITY): Payer: Self-pay | Admitting: *Deleted

## 2017-12-25 NOTE — Telephone Encounter (Signed)
Pt called regarding results from recent visit. Encouraged patient to follow up with PCP if symptoms persist. Daughter verbalized understanding.

## 2017-12-25 NOTE — Progress Notes (Signed)
KELECHI, ASTARITA ([id number]) Visit Report for 12/19/2017 Arrival Information Details Patient Name: [name], [name] Date of Service: 12/19/2017 11:15 AM Medical Record Number: [id number] Patient Account Number: [id number] Date of Birth/Sex: [birth year] (84 y.o. Female) Treating RN: Montey Hora Primary Care Arletha Marschke: PATIENT, NO Other Clinician: Referring Noreta Kue: Referral, Self Treating Tabatha Razzano/Extender: STONE III, HOYT Weeks in Treatment: 8 Visit Information History Since Last Visit Added or deleted any medications: No Patient Arrived: Walker Any new allergies or adverse reactions: No Arrival Time: 11:21 Had a fall or experienced change in No Accompanied By: dtr activities of daily living that may affect Transfer Assistance: None risk of falls: Patient Identification Verified: Yes Signs or symptoms of abuse/neglect since last visito No Secondary Verification Process Completed: Yes Hospitalized since last visit: No Patient Requires Transmission-Based Precautions: No Has Dressing in Place as Prescribed: Yes Patient Has Alerts: No Pain Present Now: No Electronic Signature(s) Signed: 12/19/2017 4:54:11 PM By: Montey Hora Entered By: Montey Hora on 12/19/2017 11:21:49 [name] ([id number]) -------------------------------------------------------------------------------- Encounter Discharge Information Details Patient Name: [name] Date of Service: 12/19/2017 11:15 AM Medical Record Number: [id number] Patient Account Number: [id number] Date of Birth/Sex: 1919/03/15 (84 y.o. Female) Treating RN: Roger Shelter Primary Care Danee Soller: PATIENT, NO Other Clinician: Referring Valen Gillison: Referral, Self Treating Karim Aiello/Extender: STONE III, HOYT Weeks in Treatment: 8 Encounter Discharge Information Items Discharge Pain Level: 0 Discharge Condition: Stable Ambulatory Status: Walker Discharge Destination: Home Transportation: Private Auto Accompanied By:  daughter Schedule Follow-up Appointment: Yes Medication Reconciliation completed and No provided to Patient/Care Altie Savard: Provided on Clinical Summary of Care: 12/19/2017 Form Type Recipient Paper Patient [facility] Electronic Signature(s) Signed: 12/25/2017 2:38:37 PM By: Ruthine Dose Entered By: Ruthine Dose on 12/19/2017 11:46:57 [name] ([id number]) -------------------------------------------------------------------------------- Lower Extremity Assessment Details Patient Name: [name] Date of Service: 12/19/2017 11:15 AM Medical Record Number: [id number] Patient Account Number: [id number] Date of Birth/Sex: August 23, 1919 (84 y.o. Female) Treating RN: Montey Hora Primary Care Garrison Michie: PATIENT, NO Other Clinician: Referring Skyla Champagne: Referral, Self Treating Breleigh Carpino/Extender: STONE III, HOYT Weeks in Treatment: 8 Vascular Assessment Claudication: Claudication Assessment [Right:None] Pulses: Dorsalis Pedis Palpable: [Right:Yes] Posterior Tibial Extremity colors, hair growth, and conditions: Extremity Color: [Right:Normal] Hair Growth on Extremity: [Right:No] Temperature of Extremity: [Right:Warm] Capillary Refill: [Right:< 3 seconds] Toe Nail Assessment Left: Right: Thick: Yes Discolored: Yes Deformed: No Improper Length and Hygiene: No Electronic Signature(s) Signed: 12/19/2017 4:54:11 PM By: Montey Hora Entered By: Montey Hora on 12/19/2017 11:29:01 [name] ([id number]) -------------------------------------------------------------------------------- Multi Wound Chart Details Patient Name: [name] Date of Service: 12/19/2017 11:15 AM Medical Record Number: [id number] Patient Account Number: [id number] Date of Birth/Sex: 1919/07/15 (84 y.o. 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] [7:Arterial Insufficiency Ulcer] [N/A:N/A] Comorbid History: [6:N/A] [7:Cataracts, Hypertension, Osteoarthritis] [N/A:N/A] Date Acquired: [6:10/16/2017] [7:10/11/2017] [N/A:N/A] Weeks of Treatment: [6:8] [7:8] [N/A:N/A] Wound Status: [6:Open] [7:Open] [N/A:N/A] Measurements L x W x D [6:0.1x0.1x0.1] [7:0.9x1x0.2] [N/A:N/A] (cm) Area (cm) : [6:0.008] [7:0.707] [N/A:N/A] Volume (cm) : [6:0.001] [7:0.141] [N/A:N/A] % Reduction in Area: [6:94.90%] [7:-25.10%] [N/A:N/A] % Reduction in Volume: [6:93.80%] [7:-24.80%] [N/A:N/A] Classification: [6:Partial Thickness] [7:Partial Thickness] [N/A:N/A] Exudate Amount: [6:N/A] [7:Large] [N/A:N/A] Exudate Type: [6:N/A] [  7:Serous] [N/A:N/A] Exudate Color: [6:N/A] [7:amber] [N/A:N/A] Wound Margin: [6:N/A] [7:Distinct, outline attached] [N/A:N/A] Granulation Amount: [6:N/A] [7:Medium (34-66%)] [N/A:N/A] Granulation Quality: [6:N/A] [7:Pink] [N/A:N/A] Necrotic Amount: [6:N/A] [7:Medium (34-66%)] [N/A:N/A] Epithelialization: [6:N/A] [7:None] [N/A:N/A] Periwound Skin Texture: [6:No Abnormalities Noted] [7:Induration: Yes] [N/A:N/A] Periwound Skin Moisture: [6:No Abnormalities Noted] [7:Maceration: Yes] [N/A:N/A] Periwound Skin Color: [6:No Abnormalities Noted] [7:Erythema: Yes] [N/A:N/A] Erythema Location: [6:N/A] [7:Circumferential] [N/A:N/A] Temperature: [6:N/A] [7:No Abnormality] [N/A:N/A] Tenderness on Palpation: [6:No] [7:Yes] [N/A:N/A] Wound Preparation: [6:N/A] [7:Ulcer Cleansing: Rinsed/Irrigated with Saline]  [N/A:N/A] Topical Anesthetic Applied: Other: lidocaine 4% [name], [name] ([id number]) Treatment Notes Electronic Signature(s) Signed: 12/20/2017 8:38:15 AM By: Alric Quan Entered By: Alric Quan on 12/19/2017 11:33:11 [name] ([id number]) -------------------------------------------------------------------------------- Multi-Disciplinary Care Plan Details Patient Name: [name] Date of Service: 12/19/2017 11:15 AM Medical Record Number: [id number] Patient Account Number: [id number] Date of Birth/Sex: 05/12/1919 (84 y.o. Female) Treating RN: Carolyne Fiscal, Debi Primary Care Jolean Madariaga: PATIENT, NO Other Clinician: Referring Breniya Goertzen: Referral, Self Treating Adalberto Metzgar/Extender: STONE III, HOYT Weeks in Treatment: 8 Active Inactive ` Abuse / Safety / Falls / Self Care Management Nursing Diagnoses: History of Falls Potential for falls Goals: Patient will not experience any injury related to falls Date Initiated: 10/23/2017 Target Resolution Date: 02/17/2018 Goal Status: Active Interventions: Assess Activities of Daily Living upon admission and as needed Assess fall risk on admission and as needed Assess: immobility, friction, shearing, incontinence upon admission and as needed Assess impairment of mobility on admission and as needed per policy Assess personal safety and home safety (as indicated) on admission and as needed Assess self care needs on admission and as needed Notes: ` Nutrition Nursing Diagnoses: Imbalanced nutrition Potential for alteratiion in Nutrition/Potential for imbalanced nutrition Goals: Patient/caregiver agrees to and verbalizes understanding of need to use nutritional supplements and/or vitamins as prescribed Date Initiated: 10/23/2017 Target Resolution Date: 02/17/2018 Goal Status: Active Interventions: Assess patient nutrition upon admission and as needed per policy Notes: ` Orientation to the Wound Care Program Nursing  Diagnoses: [name], [name] ([id number]) Knowledge deficit related to the wound healing center program Goals: Patient/caregiver will verbalize understanding of the [facility] Date Initiated: 10/23/2017 Target Resolution Date: 11/18/2017 Goal Status: Active Interventions: Provide education on orientation to the wound center Notes: ` Pain, Acute or Chronic Nursing Diagnoses: Pain, acute or chronic: actual or potential Potential alteration in comfort, pain Goals: Patient/caregiver will verbalize adequate pain control between visits Date Initiated: 10/23/2017 Target Resolution Date: 02/17/2018 Goal Status: Active Interventions: Complete pain assessment as per visit requirements Notes: ` Wound/Skin Impairment Nursing Diagnoses: Impaired tissue integrity Knowledge deficit related to ulceration/compromised skin integrity Goals: Ulcer/skin breakdown will have a volume reduction of 80% by week 12 Date Initiated: 10/23/2017 Target Resolution Date: 02/17/2018 Goal Status: Active Interventions: Assess patient/caregiver ability to perform ulcer/skin care regimen upon admission and as needed Assess ulceration(s) every visit Notes: Electronic Signature(s) Signed: 12/20/2017 8:38:15 AM By: Alric Quan Entered By: Alric Quan on 12/19/2017 11:32:59 [name] ([id number]) -------------------------------------------------------------------------------- Pain Assessment Details Patient Name: [name] Date of Service: 12/19/2017 11:15 AM Medical Record Number: [id number] Patient Account Number: [id number] Date of Birth/Sex: Aug 04, 1919 (84 y.o. Female) Treating RN: Montey Hora Primary Care Koleen Celia: PATIENT, NO Other Clinician: Referring Jenita Rayfield: Referral, Self Treating Random Dobrowski/Extender: STONE III, HOYT Weeks in Treatment: 8 Active Problems Location of Pain Severity and Description of Pain Patient Has Paino Yes Site Locations Pain Location: Pain  in Ulcers With Dressing Change: Yes Duration of the Pain. Constant / Intermittento Constant Pain Management and Medication Current Pain Management: Notes Topical  or injectable lidocaine is offered to patient for acute pain when surgical debridement is performed. If needed, Patient is instructed to use over the counter pain medication for the following 24-48 hours after debridement. Wound care MDs do not prescribed pain medications. Patient has chronic pain or uncontrolled pain. Patient has been instructed to make an appointment with their Primary Care Physician for pain management. Electronic Signature(s) Signed: 12/19/2017 4:54:11 PM By: Montey Hora Entered By: Montey Hora on 12/19/2017 11:22:58 [name] ([id number]) -------------------------------------------------------------------------------- Patient/Caregiver Education Details Patient Name: [name] Date of Service: 12/19/2017 11:15 AM Medical Record Number: [id number] Patient Account Number: [id number] Date of Birth/Gender: 1918-10-23 (84 y.o. Female) Treating RN: Roger Shelter Primary Care Physician: PATIENT, NO Other Clinician: Referring Physician: Referral, Self Treating Physician/Extender: Melburn Hake, HOYT Weeks in Treatment: 8 Education Assessment Education Provided To: Patient Education Topics Provided Wound Debridement: Handouts: Wound Debridement Methods: Explain/Verbal Responses: State content correctly Wound/Skin Impairment: Handouts: Caring for Your Ulcer Methods: Explain/Verbal Responses: State content correctly Electronic Signature(s) Signed: 12/20/2017 8:28:46 AM By: Roger Shelter Entered By: Roger Shelter on 12/19/2017 [name], [name] ([id number]) -------------------------------------------------------------------------------- Wound Assessment Details Patient Name: [name] Date of Service: 12/19/2017 11:15 AM Medical Record Number: [id number] Patient Account  Number: [id number] Date of Birth/Sex: June 19, 1919 (84 y.o. Female) Treating RN: Carolyne Fiscal, Debi Primary Care Gannon Heinzman: PATIENT, NO Other Clinician: Referring Allexus Ovens: Referral, Self Treating Kaiyah Eber/Extender: STONE III, HOYT Weeks in Treatment: 8 Wound Status Wound Number: 6 Primary Etiology: Arterial Insufficiency Ulcer Wound Location: Right Toe Great - Medial Wound Status: Healed - Epithelialized Wounding Event: Gradually Appeared Comorbid History: Cataracts, Hypertension, Osteoarthritis Date Acquired: 10/16/2017 Weeks Of Treatment: 8 Clustered Wound: No Photos Photo Uploaded By: Montey Hora on 12/19/2017 12:30:05 Wound Measurements Length: (cm) 0 % Re Width: (cm) 0 % Re Depth: (cm) 0 Epit Area: (cm) 0 Tun Volume: (cm) 0 Und duction in Area: 100% duction in Volume: 100% helialization: Large (67-100%) neling: No ermining: No Wound Description Classification: Partial Thickness Wound Margin: Distinct, outline attached Exudate Amount: None Present Foul Odor After Cleansing: No Slough/Fibrino No Wound Bed Granulation Amount: None Present (0%) Exposed Structure Necrotic Amount: None Present (0%) Fascia Exposed: No Fat Layer (Subcutaneous Tissue) Exposed: No Tendon Exposed: No Muscle Exposed: No Joint Exposed: No Bone Exposed: No Periwound Skin Texture Texture Color No Abnormalities Noted: No No Abnormalities Noted: No [name], [name] ([id number]) Moisture Temperature / Pain No Abnormalities Noted: No Temperature: No Abnormality Tenderness on Palpation: Yes Wound Preparation Ulcer Cleansing: Rinsed/Irrigated with Saline Topical Anesthetic Applied: None Electronic Signature(s) Signed: 12/20/2017 8:38:15 AM By: Alric Quan Entered By: Alric Quan on 12/19/2017 11:33:53 [name] ([id number]) -------------------------------------------------------------------------------- Wound Assessment Details Patient Name: [name] Date of  Service: 12/19/2017 11:15 AM Medical Record Number: [id number] Patient Account Number: [id number] Date of Birth/Sex: [birth year] (84 y.o. Female) Treating RN: Montey Hora Primary Care Kelda Azad: PATIENT, NO Other Clinician: Referring Teleshia Lemere: Referral, Self Treating Ashlynd Michna/Extender: STONE III, HOYT Weeks in Treatment: 8 Wound Status Wound Number: 7 Primary Etiology: Arterial Insufficiency Ulcer Wound Location: Right Foot - Dorsal Wound Status: Open Wounding Event: Gradually Appeared Comorbid History: Cataracts, Hypertension, Osteoarthritis Date Acquired: 10/11/2017 Weeks Of Treatment: 8 Clustered Wound: No Photos Photo Uploaded By: Montey Hora on 12/19/2017 12:30:26 Wound Measurements Length: (cm) 0.9 Width: (cm) 1 Depth: (cm) 0.2 Area: (cm) 0.707 Volume: (cm) 0.141 % Reduction in Area: -25.1% % Reduction in Volume: -24.8% Epithelialization: None Tunneling: No Undermining: No Wound Description Classification: Partial Thickness Wound Margin: Distinct, outline attached Exudate Amount: Large Exudate Type: Serous Exudate  Color: amber Foul Odor After Cleansing: No Slough/Fibrino Yes Wound Bed Granulation Amount: Medium (34-66%) Exposed Structure Granulation Quality: Pink Fascia Exposed: No Necrotic Amount: Medium (34-66%) Fat Layer (Subcutaneous Tissue) Exposed: Yes Necrotic Quality: Adherent Slough Tendon Exposed: No Muscle Exposed: No Joint Exposed: No Bone Exposed: No Periwound Skin Texture [name], [name] ([id number]) Texture Color No Abnormalities Noted: No No Abnormalities Noted: No Induration: Yes Erythema: Yes Erythema Location: Circumferential Moisture No Abnormalities Noted: No Temperature / Pain Maceration: Yes Temperature: No Abnormality Tenderness on Palpation: Yes Wound Preparation Ulcer Cleansing: Rinsed/Irrigated with Saline Topical Anesthetic Applied: Other: lidocaine 4%, Treatment Notes Wound #7 (Right, Dorsal Foot) 1. Cleansed  with: Clean wound with Normal Saline 2. Anesthetic Topical Lidocaine 4% cream to wound bed prior to debridement 4. Dressing Applied: Dry Gauze Prisma Ag Notes great toe-bandaid top of foot - gauze and tegaderm Electronic Signature(s) Signed: 12/19/2017 4:54:11 PM By: Montey Hora Entered By: Montey Hora on 12/19/2017 11:28:28 [name] ([id number]) -------------------------------------------------------------------------------- [place] Details Patient Name: [name] Date of Service: 12/19/2017 11:15 AM Medical Record Number: [id number] Patient Account Number: [id number] Date of Birth/Sex: 08-May-1919 (84 y.o. Female) Treating RN: Montey Hora Primary Care Maribell Demeo: PATIENT, NO Other Clinician: Referring Olympia Adelsberger: Referral, Self Treating Shammond Arave/Extender: STONE III, HOYT Weeks in Treatment: 8 Vital Signs Time Taken: 11:23 Temperature (F): 97.8 Height (in): 64 Pulse (bpm): 81 Weight (lbs): 156 Respiratory Rate (breaths/min): 16 Body Mass Index (BMI): 26.8 Blood Pressure (mmHg): 157/52 Reference Range: 80 - 120 mg / dl Electronic Signature(s) Signed: 12/19/2017 4:54:11 PM By: Montey Hora Entered By: Montey Hora on 12/19/2017 11:24:20

## 2018-01-02 ENCOUNTER — Encounter: Payer: Medicare PPO | Admitting: Physician Assistant

## 2018-01-02 ENCOUNTER — Ambulatory Visit: Payer: Medicare PPO | Admitting: Cardiovascular Disease

## 2018-01-02 DIAGNOSIS — L97512 Non-pressure chronic ulcer of other part of right foot with fat layer exposed: Secondary | ICD-10-CM | POA: Diagnosis not present

## 2018-01-04 NOTE — Progress Notes (Signed)
MCCALL, WILL ([id number]) Visit Report for 01/02/2018 Arrival Information Details Patient Name: [name], [name] Date of Service: 01/02/2018 9:30 AM Medical Record Number: [id number] Patient Account Number: [id number] Date of Birth/Sex: 1919/04/30 (84 y.o. F) Treating RN: Montey Hora Primary Care Jaliana Medellin: PATIENT, NO Other Clinician: Referring Hooria Gasparini: Referral, Self Treating Sway Guttierrez/Extender: STONE III, HOYT Weeks in Treatment: 10 Visit Information History Since Last Visit Added or deleted any medications: No Patient Arrived: Walker Any new allergies or adverse reactions: No Arrival Time: 09:45 Had a fall or experienced change in No Accompanied By: dtr activities of daily living that may affect Transfer Assistance: None risk of falls: Patient Identification Verified: Yes Signs or symptoms of abuse/neglect since last visito No Secondary Verification Process Completed: Yes Hospitalized since last visit: No Patient Requires Transmission-Based Precautions: No Implantable device outside of the clinic excluding No Patient Has Alerts: No cellular tissue based products placed in the center since last visit: Has Dressing in Place as Prescribed: Yes Pain Present Now: No Electronic Signature(s) Signed: 01/02/2018 4:44:49 PM By: Montey Hora Entered By: Montey Hora on 01/02/2018 09:46:21 [name] ([id number]) -------------------------------------------------------------------------------- Encounter Discharge Information Details Patient Name: [name] Date of Service: 01/02/2018 9:30 AM Medical Record Number: [id number] Patient Account Number: [id number] Date of Birth/Sex: Apr 24, 1919 (84 y.o. F) Treating RN: Ahmed Prima Primary Care Mauria Asquith: PATIENT, NO Other Clinician: Referring Coulter Oldaker: Referral, Self Treating Lycia Sachdeva/Extender: STONE III, HOYT Weeks in Treatment: 10 Encounter Discharge Information Items Discharge Pain Level: 0 Discharge Condition:  Stable Ambulatory Status: Walker Discharge Destination: Nursing Home Transportation: Private Auto Schedule Follow-up Appointment: No Medication Reconciliation completed and No provided to Patient/Care Deshanti Adcox: Provided on Clinical Summary of Care: 01/02/2018 Form Type Recipient Paper Patient [facility] Electronic Signature(s) Signed: 01/03/2018 7:46:43 AM By: Roger Shelter Entered By: Roger Shelter on 01/02/2018 10:28:03 [name] ([id number]) -------------------------------------------------------------------------------- Lower Extremity Assessment Details Patient Name: [name] Date of Service: 01/02/2018 9:30 AM Medical Record Number: [id number] Patient Account Number: [id number] Date of Birth/Sex: 12-Dec-1918 (84 y.o. F) Treating RN: Montey Hora Primary Care Anastasya Jewell: PATIENT, NO Other Clinician: Referring Niamh Rada: Referral, Self Treating Jamont Mellin/Extender: STONE III, HOYT Weeks in Treatment: 10 Vascular Assessment Pulses: Dorsalis Pedis Palpable: [Right:Yes] Posterior Tibial Extremity colors, hair growth, and conditions: Extremity Color: [Right:Normal] Hair Growth on Extremity: [Right:No] Temperature of Extremity: [Right:Warm] Capillary Refill: [Right:< 3 seconds] Toe Nail Assessment Left: Right: Thick: Yes Discolored: Yes Deformed: Yes Improper Length and Hygiene: No Electronic Signature(s) Signed: 01/02/2018 4:44:49 PM By: Montey Hora Entered By: Montey Hora on 01/02/2018 09:52:43 [name] ([id number]) -------------------------------------------------------------------------------- Multi Wound Chart Details Patient Name: [name] Date of Service: 01/02/2018 9:30 AM Medical Record Number: [id number] Patient Account Number: [id number] Date of Birth/Sex: 1919-08-28 (84 y.o. 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] [N/A:N/A] Comorbid History: [7:Cataracts, Hypertension, Osteoarthritis] [N/A:N/A] Date Acquired: [7:10/11/2017] [N/A:N/A] Weeks of Treatment: [7:10] [N/A:N/A] Wound Status: [7:Open] [N/A:N/A] Measurements L x W x D [7:0.8x1x0.1] [N/A:N/A] (cm) Area (cm) : [7:0.628] [N/A:N/A] Volume (cm) : [7:0.063] [N/A:N/A] % Reduction in Area: [7:-11.20%] [N/A:N/A] % Reduction in Volume: [7:44.20%] [N/A:N/A] Classification: [7:Partial Thickness] [N/A:N/A] Exudate Amount: [7:Large] [N/A:N/A] Exudate Type: [7:Serous] [N/A:N/A] Exudate Color: [7:amber] [N/A:N/A] Wound Margin: [7:Distinct, outline attached] [  N/A:N/A] Granulation Amount: [7:Medium (34-66%)] [N/A:N/A] Granulation Quality: [7:Pink] [N/A:N/A] Necrotic Amount: [7:Medium (34-66%)] [N/A:N/A] Exposed Structures: [7:Fat Layer (Subcutaneous Tissue) Exposed: Yes Fascia: No Tendon: No Muscle: No Joint: No Bone: No] [N/A:N/A] Epithelialization: [7:None] [N/A:N/A] Periwound Skin Texture: [7:Induration: Yes] [N/A:N/A] Periwound Skin Moisture: [7:Maceration: Yes] [N/A:N/A] Periwound Skin Color: [7:Erythema: Yes] [N/A:N/A] Erythema Location: [7:Circumferential] [N/A:N/A] Temperature: [7:No Abnormality] [N/A:N/A] Tenderness on Palpation: [7:Yes] [N/A:N/A] Wound Preparation: Ulcer Cleansing: N/A N/A Rinsed/Irrigated with Saline Topical Anesthetic Applied: Other: lidocaine 4% Treatment Notes Electronic Signature(s) Signed: 01/03/2018 4:37:47 PM By: Alric Quan Entered By: Alric Quan on 01/02/2018 10:20:17 [name]  ([id number]) -------------------------------------------------------------------------------- [place] Details Patient Name: [name] Date of Service: 01/02/2018 9:30 AM Medical Record Number: [id number] Patient Account Number: [id number] Date of Birth/Sex: 05-05-1919 (84 y.o. F) Treating RN: Ahmed Prima Primary Care Trace Wirick: PATIENT, NO Other Clinician: Referring Gerald Kuehl: Referral, Self Treating Linday Rhodes/Extender: STONE III, HOYT Weeks in Treatment: 10 Active Inactive ` Abuse / Safety / Falls / Self Care Management Nursing Diagnoses: History of Falls Potential for falls Goals: Patient will not experience any injury related to falls Date Initiated: 10/23/2017 Target Resolution Date: 02/17/2018 Goal Status: Active Interventions: Assess Activities of Daily Living upon admission and as needed Assess fall risk on admission and as needed Assess: immobility, friction, shearing, incontinence upon admission and as needed Assess impairment of mobility on admission and as needed per policy Assess personal safety and home safety (as indicated) on admission and as needed Assess self care needs on admission and as needed Notes: ` Nutrition Nursing Diagnoses: Imbalanced nutrition Potential for alteratiion in Nutrition/Potential for imbalanced nutrition Goals: Patient/caregiver agrees to and verbalizes understanding of need to use nutritional supplements and/or vitamins as prescribed Date Initiated: 10/23/2017 Target Resolution Date: 02/17/2018 Goal Status: Active Interventions: Assess patient nutrition upon admission and as needed per policy Notes: ` Orientation to the Wound Care Program Nursing Diagnoses: [name], [name] ([id number]) Knowledge deficit related to the wound healing center program Goals: Patient/caregiver will verbalize understanding of the [place] Program Date Initiated: 10/23/2017 Target Resolution Date: 11/18/2017 Goal  Status: Active Interventions: Provide education on orientation to the wound center Notes: ` Pain, Acute or Chronic Nursing Diagnoses: Pain, acute or chronic: actual or potential Potential alteration in comfort, pain Goals: Patient/caregiver will verbalize adequate pain control between visits Date Initiated: 10/23/2017 Target Resolution Date: 02/17/2018 Goal Status: Active Interventions: Complete pain assessment as per visit requirements Notes: ` Wound/Skin Impairment Nursing Diagnoses: Impaired tissue integrity Knowledge deficit related to ulceration/compromised skin integrity Goals: Ulcer/skin breakdown will have a volume reduction of 80% by week 12 Date Initiated: 10/23/2017 Target Resolution Date: 02/17/2018 Goal Status: Active Interventions: Assess patient/caregiver ability to perform ulcer/skin care regimen upon admission and as needed Assess ulceration(s) every visit Notes: Electronic Signature(s) Signed: 01/03/2018 4:37:47 PM By: Alric Quan Entered By: Alric Quan on 01/02/2018 10:19:55 [name] ([id number]) -------------------------------------------------------------------------------- Pain Assessment Details Patient Name: [name] Date of Service: 01/02/2018 9:30 AM Medical Record Number: [id number] Patient Account Number: [id number] Date of Birth/Sex: Nov 17, 1918 (84 y.o. F) Treating RN: Montey Hora Primary Care Joselyn Edling: PATIENT, NO Other Clinician: Referring Manami Tutor: Referral, Self Treating Author Hatlestad/Extender: STONE III, HOYT Weeks in Treatment: 10 Active Problems Location of Pain Severity and Description of Pain Patient Has Paino Yes Site Locations Pain Location: Pain in Ulcers With Dressing Change: Yes Duration of the Pain. Constant / Intermittento Intermittent Pain Management and Medication Current Pain Management: Notes Topical or injectable lidocaine is offered to patient for acute pain when surgical debridement is  performed. If needed, Patient is instructed to use over the counter pain medication for the following 24-48 hours after debridement. Wound care MDs do not prescribed pain medications. Patient has chronic pain or uncontrolled pain. Patient has been instructed to make an appointment with their Primary Care Physician for pain management. Electronic Signature(s) Signed: 01/02/2018 4:44:49 PM By: Montey Hora Entered By: Montey Hora on 01/02/2018 09:46:40 [name] ([id number]) -------------------------------------------------------------------------------- Patient/Caregiver Education Details Patient Name: [name] Date of Service: 01/02/2018 9:30 AM Medical Record Number: [id number] Patient Account Number: [id number] Date of Birth/Gender: 03/08/1919 (84 y.o. F) Treating RN: Roger Shelter Primary Care Physician: PATIENT, NO Other Clinician: Referring Physician: Referral, Self Treating Physician/Extender: Melburn Hake, HOYT Weeks in Treatment: 10 Education Assessment Education Provided To: Patient Education Topics Provided Wound Debridement: Handouts: Wound Debridement Methods: Explain/Verbal Responses: State content correctly Wound/Skin Impairment: Handouts: Caring for Your Ulcer Methods: Explain/Verbal Responses: State content correctly Electronic Signature(s) Signed: 01/03/2018 7:46:43 AM By: Roger Shelter Entered By: Roger Shelter on 01/02/2018 10:28:24 [name] ([id number]) -------------------------------------------------------------------------------- Wound Assessment Details Patient Name: [name] Date of Service: 01/02/2018 9:30 AM Medical Record Number: [id number] Patient Account Number: [id number] Date of Birth/Sex: [birth year] (84 y.o. F) Treating RN: Montey Hora Primary Care Ramanda Paules: PATIENT, NO Other Clinician: Referring Vineet Kinney: Referral, Self Treating Mekenna Finau/Extender: STONE III, HOYT Weeks in Treatment: 10 Wound  Status Wound Number: 7 Primary Etiology: Arterial Insufficiency Ulcer Wound Location: Right Foot - Dorsal Wound Status: Open Wounding Event: Gradually Appeared Comorbid History: Cataracts, Hypertension, Osteoarthritis Date Acquired: 10/11/2017 Weeks Of Treatment: 10 Clustered Wound: No Photos Photo Uploaded By: Montey Hora on 01/02/2018 14:10:44 Wound Measurements Length: (cm) 0.8 Width: (cm) 1 Depth: (cm) 0.1 Area: (cm) 0.628 Volume: (cm) 0.063 % Reduction in Area: -11.2% % Reduction in Volume: 44.2% Epithelialization: None Tunneling: No Undermining: No Wound Description Classification: Partial Thickness Wound Margin: Distinct, outline attached Exudate Amount: Large Exudate Type: Serous Exudate Color: amber Foul Odor After Cleansing: No Slough/Fibrino Yes Wound Bed Granulation Amount: Medium (34-66%) Exposed Structure Granulation Quality: Pink Fascia Exposed: No Necrotic Amount: Medium (34-66%) Fat Layer (Subcutaneous Tissue) Exposed: Yes Necrotic Quality: Adherent Slough Tendon Exposed: No Muscle Exposed: No Joint Exposed: No Bone Exposed: No Periwound Skin Texture Topper, Richa ([id number]) Texture Color No Abnormalities Noted: No No Abnormalities Noted: No Induration: Yes Erythema: Yes Erythema Location: Circumferential Moisture No Abnormalities Noted: No Temperature / Pain Maceration: Yes Temperature: No Abnormality Tenderness on Palpation: Yes Wound Preparation Ulcer Cleansing: Rinsed/Irrigated with Saline Topical Anesthetic Applied: Other: lidocaine 4%, Treatment Notes Wound #7 (Right, Dorsal Foot) 1. Cleansed with: Clean wound with Normal Saline 2. Anesthetic Topical Lidocaine 4% cream to wound bed prior to debridement 4. Dressing Applied: Prisma Ag 5. Secondary Dressing Applied Dry Gauze Tegaderm Electronic Signature(s) Signed: 01/02/2018 4:44:49 PM By: Montey Hora Entered By: Montey Hora on 01/02/2018 09:51:42 [name] ([id number]) -------------------------------------------------------------------------------- Vitals Details Patient Name: [name] Date of Service: 01/02/2018 9:30 AM Medical Record Number: [id number] Patient Account Number: [id number] Date of Birth/Sex: 1919/08/22 (84 y.o. F) Treating RN: Montey Hora Primary Care Rida Loudin: PATIENT, NO Other Clinician: Referring Huy Majid: Referral, Self Treating Leonidas Boateng/Extender: STONE III, HOYT Weeks in Treatment: 10 Vital Signs Time Taken: 09:46 Temperature (F): 98.0 Height (in): 64 Pulse (bpm): 94 Weight (lbs): 156 Respiratory Rate (breaths/min): 16 Body Mass Index (BMI): 26.8 Blood Pressure (mmHg): 160/56 Reference Range: 80 - 120 mg / dl Electronic Signature(s) Signed: 01/02/2018 4:44:49 PM By: Montey Hora Entered By: Montey Hora on 01/02/2018 09:47:23

## 2018-01-04 NOTE — Progress Notes (Signed)
[name], [name] ([id number]) Visit Report for 01/02/2018 Chief Complaint Document Details Patient Name: [name], [name] Date of Service: 01/02/2018 9:30 AM Medical Record Number: [id number] Patient Account Number: [id number] Date of Birth/Sex: 08/28/1919 (84 y.o. F) Treating RN: Ahmed Prima Primary Care Provider: PATIENT, NO Other Clinician: Referring Provider: Referral, Self Treating Provider/Extender: STONE III, Chrisann Melaragno Weeks in Treatment: 10 Information Obtained from: Patient Chief Complaint Right foot Ulcers Electronic Signature(s) Signed: 01/03/2018 12:10:29 AM By: Worthy Keeler PA-C Entered By: Worthy Keeler on 01/02/2018 09:52:26 [name] ([id number]) -------------------------------------------------------------------------------- Debridement Details Patient Name: [name] Date of Service: 01/02/2018 9:30 AM Medical Record Number: [id number] Patient Account Number: [id number] Date of Birth/Sex: 30-Jun-1919 (84 y.o. F) Treating RN: Ahmed Prima Primary Care Provider: PATIENT, NO Other Clinician: Referring Provider: Referral, Self Treating Provider/Extender: STONE III, Keiva Dina Weeks in Treatment: 10 Debridement Performed for Wound #7 Right,Dorsal Foot Assessment: Performed By: Physician STONE III, Alfie Rideaux E., PA-C Debridement Type: Debridement Severity of Tissue Pre Fat layer exposed Debridement: Pre-procedure Verification/Time Yes - 10:18 Out Taken: Start Time: 10:19 Pain Control: Lidocaine 4% Topical Solution Total Area Debrided (L x W): 0.8 (cm) x 1 (cm) = 0.8 (cm) Tissue and other material Viable, Non-Viable, Slough, Subcutaneous, Fibrin/Exudate, Slough debrided: Level: Skin/Subcutaneous Tissue Debridement Description: Excisional Instrument: Curette Bleeding: Minimum Hemostasis Achieved: Pressure End Time: 10:22 Procedural Pain: 0 Post Procedural Pain: 0 Response to Treatment: Procedure was tolerated well Post Debridement Measurements of  Total Wound Length: (cm) 0.8 Width: (cm) 1 Depth: (cm) 0.2 Volume: (cm) 0.126 Character of Wound/Ulcer Post Debridement: Requires Further Debridement Severity of Tissue Post Debridement: Fat layer exposed Post Procedure Diagnosis Same as Pre-procedure Electronic Signature(s) Signed: 01/03/2018 12:10:29 AM By: Worthy Keeler PA-C Signed: 01/03/2018 4:37:47 PM By: Alric Quan Entered By: Alric Quan on 01/02/2018 10:22:12 [name] ([id number]) -------------------------------------------------------------------------------- HPI Details Patient Name: [name] Date of Service: 01/02/2018 9:30 AM Medical Record Number: [id number] Patient Account Number: [id number] Date of Birth/Sex: 12-Jan-1919 (84 y.o. F) Treating RN: Ahmed Prima Primary Care Provider: PATIENT, NO Other Clinician: Referring Provider: Referral, Self Treating Provider/Extender: STONE III, Danyeal Akens Weeks in Treatment: 10 History of Present Illness HPI Description: 04/06/16; this is a 84 year old woman who arrives accompanied by 2 daughters for a wound on her left ankle and her left fifth toe. These have apparently been present for a year. I'm not quite certain how she came to this clinic however she was being followed by Sharlotte Alamo her podiatrist for these wounds. She was also referred to [facility] and they apparently did a test presumably arterial studies although we don't have any of these results and we couldn't get through to the office today. The family but has been applying a combination of Bactroban and a light bandage and perhaps more recently Silvadene cream. She did have an x-ray of the foot roughly 6 months ago at the podiatry office the family was unaware that if there were any abnormalities. Apparently they have not seen any healing here. Our intake nurse noted a slight skin tear on the right anterior lower leg. Nobody seemed aware of this. ABIs calculated in this clinic  was 0.3 on the right and 0.4 on the left I have reviewed things in [facility]. There is very little information on this patient. She apparently follows in current total clinic which we don't have information from. She has mentioned already been to a [facility]. She has a history of hypothyroidism, nephrolithiasis arthritis and has had a previous mastectomy. 04/13/16; patient's x-ray  was normal. She is already been to see Dr. Delana Meyer vascular surgery. Her arterial exam was from November 2016 this showed a left ABI of 0.62 her right of 0.76. Her duplex ultrasound of the left leg showed biphasic waves in the common femoral and distal femoral artery however monophasic waves in the superficial femoral artery proximal and mid biphasic it distal. 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 The patient tells me that she has pain at night when she tries to lie down which is improved by getting up and sitting in the chair this sounds like claudication at rest. Her wounds are on the left medial malleolus and the dorsal fifth toe small punched out wounds that are right on bone. We use Santyl last week 04/20/16: nurse informed me pt has declined evaluation for significant PAD. she denies systemic s/s of infections. 04/27/16;; the patient has had noninvasive arterial studies done in November 2016. ABI and the left was 0.62. Monophasic waves at the superficial femoral artery. Occluded to the posterior tibial artery. So had greater than 50% stenosis of the right superficial femoral and greater than 50% stenosis of the left superficial femoral artery. She had bilateral tibial peroneal artery disease. Both of the wounds on the left fifth toe and left lateral malleolus have been present for more than a year. They have been to see vein and vascular. The patient has some pain but miraculously I think the wounds have largely been stable. No evidence of infection 05/04/16; she goes for a noninvasive study tomorrow and then sees  Dr. Fletcher Anon on Monday. By the time she is here next week we should have a better picture of whether something can be done with regards to her arterial flow. We continue to have ischemic-looking wounds on the left fifth toe and left lateral malleolus. 05/10/16; the patient went for her arterial studies and saw Dr. Fletcher Anon. As predicted she is felt to have critical limb ischemia. The feeling is that she has occlusion of the SFA. The feeling would she would be a candidate for a stent to the SFA. The patient did not make a decision to proceed with the procedure and she is here with family members to discuss this me today. He shouldn't has a lot of pain and cannot sleep and rest well at night per her family. 05/24/16; the patient went and had a complex revascularization/angioplasty of the left superficial femoral artery followed by drug-coated balloon angioplasty and spots stenting. She tolerated the procedure well. She was recommended for dual antiplatelet drugs with Plavix and aspirin for at least a month. She went yesterday for I believe follow-up serial Dopplers and ABIs although I don't see these results. The patient is unfortunately complaining of a lot of pain in her bilateral lower legs below the knees from the ankle to the knees. She apparently was prescribed lidocaine and apparently put this on her legs instead of over the wound areas. This may have something to do with it however there is a lot of edema in her bilateral legs I was able to find her arterial studies from yesterday. The left ABI has improved up to 0.66 post left SFA stent. The bilateral great toe indices remain abnormal with the left being in the 0.25 range. Duplex ultrasound showed her left SFA stent is patent monophasic waveforms persist in the left leg 06/07/16; continued punched-out areas over the dorsal left fifth toe and left lateral malleolus. No major improvement 06/28/16; the areas over her dorsal left fifth toe and  left lateral  malleolus are covered in surface slough we are using Iodoflex 07/05/16. We have been using Iodoflex for 2-3 weeks now. I have not been debridement is because of pain [name], [name] ([id number]) 07/19/16 currently we have been using Iodoflex for roughly the past month. Previously we were unable to debride due to pain although today patient states that the pain is not nearly as severe as it has been in the past. In fact she rates this to be a 1 out of 10 and it worse to a to 10 with palpation of the wound. All and all her and her daughter feel like this is actually doing steadily better at this point in time. She is pleased with progress and we have been seeing her every 2 weeks. 08/02/16; this is a delightful 84 year old woman I have not seen in over a month. She has arterial insufficiency wounds remaining over the left lateral malleolus and the left fifth toe. With the help of Dr. Fletcher Anon we are able to get her revascularized on the left. The 2 wounds on the left have been making good progress and are definitely smaller especially the area over the left lateral malleolus. She is certainly in a lot less pain than she used to be although I still think there is some claudication type pain. Unfortunately she is developed a area on the right lateral foot which is a small open area but I think is threatened. I suspect a small ischemic areas well. Also on her right fifth toe there is an area that is not open but looks as though it is receiving too much pressure from footwear. Finally an area over her right malleolus although I don't think this is on its way to anything ominous 08/17/16 patient presents today for follow up evaluation she tells me that she is really doing fairly well from a pain standpoint compared to where she has been previous. She is tolerating the dressing changes without any complication. She continues to have discharge and drainage however. 08-30-16 Ms. Vandervoort presents today with her  daughter, she states that she continues to have intermittent shooting pains to the left lateral fifth toe at this site of seems to be a healed ulcer. She denies any other issues that her wound related since her last appointment. Her daughter states that she is in need of a tramadol refill at this time as she uses half a tablet at at bedtime to aid in sleep due to foot pain. Iodoflex has been used on all wounds in previous dressing changes. 09/13/16; the patient has ischemic wounds in her feet. The area over the left lateral malleolus is healed and the area over the left fifth toe looks improved.. She has an area on the lateral aspect of her right foot which is a small but deep wound. Finally she has a new open area on the medial aspect of the left medial malleolus. This is superficial 09/27/16; the area over the left lateral malleolus remains healed. The area over the left fifth toe has a surface and she states the pain is better but I don't think this is completely closed. She has an area on the lateral aspect of her right foot is a small but deep wound. The new open area on the medial aspect of the left ankle was closed from last time. 10/18/16; the area over her left lateral malleolus remains healed. The area over the left fifth toe has a surface over the top of this however there  is no overt open area. Given the underlying issues of severe PAD and continued pain in the toe I would think it would be unlikely this is truly healed however I am not planning to debridement this area. The area on the right lateral foot which was a more recent wound is a small punched out painful area again has significant surface slough and nonviable tissue. It is clear the patient still has claudication type pain however she remains functional. I have been giving her tramadol when necessary and that seems to help a lot with her pain the patient follows up with Dr. Fletcher Anon on January 18/18 11/01/16; patient missed her  follow-up with Dr. Fletcher Anon last week due to a snow day. Follow-up is now on February 15. The areas on the right lateral malleolus and dorsal right fifth toe remain closed over. The fifth toe was tentative is there is a surface eschar however I'm not going to disturb this. Therefore, her only open areas on the right lateral foot. This is a small punched out area. We have been using Prisma. I'm quite convinced this is an ischemic wound 11/15/16; patient has follow-up with Dr. Fletcher Anon on February 15. She has no open wound on the left leg and doesn't really complain of claudication that I can determine from talking to her. However on the right leg she clearly has some degree of claudication. The remaining open wound is on the right lateral foot. We have been using Iodosorb ointment 11/29/16; patient saw Dr. Fletcher Anon on 11/24/16. His comment is that her wound on her right lateral foot seems to be improving with local wound care. She has known significant right SFA disease. Her lower extremity Doppler will be repeated and according to the patient's daughter that appointment is on March 15. He is left with the thought that endovascular intervention in the right SFA might be necessary. The patient has quite a bit of pain especially at night related to the wound in her right foot. We changed her to Iodosorb ointment last week 12/13/16; this is a patient I follow every 2 weeks largely on a palliative approach at this point about ischemic wounds currently in the right foot. Initially she had them on her left lateral malleolus and left fifth toe. The area on the left lateral malleolus healed and the fifth toe has a surface eschar that I have elected not to remove. Both of these improved after revascularization by Dr. Fletcher Anon. We have been using Iodosorb to the right lateral foot not much change here. 12/27/16; the patient had her arterial studies. This showed known bilateral SFA disease. Stable right ABI 0.5 to stable left  ABI at 0.7 to the did not do it TBI on the right it was 0.61 on the left she has a stent in the long segment of her left SFA from 05/18/16. All of her wounds are somewhat better. She states her pain is better in the right foot is improved. 01/10/17- patient is here for follow-up dilation of her right lateral foot ulcer. She has been tolerating Iodosorb. She is voices no complaints or concerns 01/24/17; small ischemic wound on right lateral foot. still non viable cover. follows with Dr. Fletcher Anon on 4/30. No open area on left foot [name], [name] ([id number]) 02/07/17 doing well and states she is pain free. Saw Dr. Fletcher Anon who said she is doing well and has "50% flow in the right leg and 70% in the left. According to her daughter he does not wish to do  any further interventions 02/21/17; small ischemic wound on the right lateral foot. Per her daughter and the patient she has no open wound on the left foot and ankle which were her initial presenting wounds. still has claudication type pain at night she is been to see interventional cardiology who does not feel that she has a need for intervention on the right leg at present. She still has claudication type pain in the right leg which she manages at night with when necessary tramadol that I have prescribed 03/14/17; small ischemic wound on the right lateral foot. They've been dressing this at home with Iodosorb ointment. The patient does not complain of any pain. The wound has a surface eschar over it and there is really no visible open area. This is similar to how the areas on the left leg healed 04/11/17; the small ischemic wound on her right lateral foot is finally closed over. Small amount of eschar over the surface however I did not attempt to remove this. The patient claims to be asymptomatic she is not having any claudication that I'm able to elicit although her activity is limited Readmission: 10/23/17 on evaluation today patient appears to be doing somewhat  poorly in regard to her right foot where she has two ulcers at this point. The worst is on the lateral aspect of her foot medial first metatarsal region is not nearly as bad. With that being said these did arise seemingly out of nowhere she has previously had a similar issue in the past these have always been ischemic wounds due to arterial insufficiency. She does see Dr. Fletcher Anon as her vascular specialist and it has been noted that her ABI's are somewhat low. She actually has a repeat evaluation coming up this March 2019 to reevaluate her blood flow. On the last check 12/22/16 it was noted that patient had stable ABI's in the lower moderate abnormal range in regard to the right in regard to the left and the upper moderate at normal range. Patient is having some discomfort though nothing severe at this point in time. She is seen with her daughter and son-in-law today. No fevers, chills, nausea, or vomiting noted at this time. 10/30/17 on evaluation today patient appears to be doing well in regard to her wounds. The right lateral foot wound appears to show signs of cleaning up nicely there is granulation noted underneath that the bed of the wound although there still some [facility] covering. Nonetheless she still has a lot of discomfort and I really do not want to proceed with debridement due to the fact that she does have so much discomfort. Good news is the tramadol has been helping her at bedtime she only takes this once a day and that has been extremely beneficial. Unfortunately she does not have a primary right now as she is awaiting a new one which is the reason that I am prescribing the tramadol. Nonetheless I'm pleased with how things seem to be progressing in regard to her ulcers. 11/06/17 on evaluation today patient appears to be doing decently well as far as maintaining in regard to her ulcers. Unfortunately I did review her arterial study which shows that she has on the right arterial obstruction  involving the superficial femoral and popliteal artery as well is the superficial femoral artery with a high grade stenosis versus inclusion. Collateral flow noted in the distal portion of the SFA. Severe progression is noted compared to previous study. Unfortunately being the patient's wounds do not seem to  be healing as appropriately and quickly as we would like I do believe this is likely a result of poor vascular flow to the right lower extremity. This was discussed with patient and her daughter during the office visit today. 11/14/17 on evaluation today patient continues to have a fairly stalled ulcer in regard to her to ulcers on the right lower extremity. She obviously did have significant findings on her arterial study and I do believe this is again due to poor vascular flow. She does have an appointment later this afternoon with her vascular specialist for further evaluation to see if there any recommendations at that point. With that being said she continues to have discomfort which I do believe is arterial in nature in regard to insufficiency. 11/21/17 on evaluation today patient appears to be doing fairly well in regard to the appearance of her wounds other than the fact that she does have erythema surrounding the wound bed at this point in fact this encompasses the majority of the dorsal surface of her right foot. There is additional granulation noted today that was not noted previously on evaluation and again if that were alone were the findings that we were seeing I would be very happy with the current progress. However unfortunately she is having the erythema which concerns me for the possibility of infection although the other possibility is that she could just be having a local reaction due to the I resort. In the past she never had this issue however when she use this for the root left foot which makes me again come back to my concern about infection. 11/28/17 on evaluation today  patient is status post having had an angiogram performed on 11/22/17 where it was noted that she had severe disease involving the TP trunk and proximal peroneal artery. Subsequently she underwent successful atherectomy and drug coated balloon angioplasty to the right SFA, TP trunk, and proximal peroneal artery. Patient stated only of has 10% residual stenosis and overall the procedure at the time was both tolerated well as well as considered a success. This was performed by Dr. Sophronia Simas. Patient was subsequently discharged home on 11/23/17 from the hospital. Overall she states that her leg pain is not nearly as severe as what it was previous which is excellent news. She also has been sleeping with her [name], [name] ([id number]) leg in the bed previously she was hanging this over the edge due to the pain. She also seems to have much better blood flow on evaluation today. 12/05/17 on evaluation today patient appears to be doing very well in regard to her quite lateral great toe as well as the right lateral foot ulcer. She has excellent blood flow and great capillary refill noted on evaluation today which is excellent news. With that being said she does have discomfort but mainly at the site of the wounds especially lateral foot wound but nothing like what she was experiencing previously. This is rated to be a 3/10 at most. She is seen with her daughter present. 12/12/17 on evaluation today patient appears to be doing very well in regard to her right foot and right medial great toe ulcers. She has been tolerating the dressing changes without complication. She doesn't seem to have any significant discomfort which is also good news. Overall she has been doing excellent since her vascular intervention in my opinion. She actually does have follow-up later today with vascular for a repeat arterial study to ensure everything seems to be doing well following her  surgery. 12/19/17 on evaluation today patient  appears to be doing excellent in regard to her lower extremity ulcers. In fact the right medial great toe ulcer has completely resolved at this point which is excellent news. The right lateral foot ulcer seems to be showing signs of improving week by week and obviously this is good news as well. Overall I'm very pleased with the progress that she has made up to this point. She does have some Slough covering the lateral foot wound which does require sharp debridement today. I did review her vascular note from Dr. Dellis Filbert and it appears that everything seems to be doing well from a vascular standpoint and the patient's foot continues to be warm and appears well vascularized. 01/02/18 on evaluation today patient appears to be doing well in regard to her right lateral foot ulcer. She has been tolerating the dressing changes without complication. There does not appear to be any evidence of infection at this time which is also good news. She has definite new granulation noted. Electronic Signature(s) Signed: 01/03/2018 12:10:29 AM By: Worthy Keeler PA-C Entered By: Worthy Keeler on 01/02/2018 12:38:32 [name] ([id number]) -------------------------------------------------------------------------------- Physical Exam Details Patient Name: [name] Date of Service: 01/02/2018 9:30 AM Medical Record Number: [id number] Patient Account Number: [id number] Date of Birth/Sex: [birth year] (84 y.o. F) Treating RN: Ahmed Prima Primary Care Provider: PATIENT, NO Other Clinician: Referring Provider: Referral, Self Treating Provider/Extender: STONE III, Asley Baskerville Weeks in Treatment: 10 Constitutional Well-nourished and well-hydrated in no acute distress. Respiratory normal breathing without difficulty. Psychiatric this patient is able to make decisions and demonstrates good insight into disease process. Alert and Oriented x 3. pleasant and cooperative. Notes Patient does not appear to have any  significant slough that could not be sharply debrided away I was not able to clean this off with saline and gauze only and therefore sharp debridement was performed utilizing a curette which she tolerated well without complication. Post debridement she seemed to be doing much better in regard to the granular surface of the wound and overall though there is fascia exposed this wound seems to be doing better. Electronic Signature(s) Signed: 01/03/2018 12:10:29 AM By: Worthy Keeler PA-C Entered By: Worthy Keeler on 01/02/2018 12:39:18 [name] ([id number]) -------------------------------------------------------------------------------- Physician Orders Details Patient Name: [name] Date of Service: 01/02/2018 9:30 AM Medical Record Number: [id number] Patient Account Number: [id number] Date of Birth/Sex: 29-Sep-1919 (84 y.o. F) Treating RN: Ahmed Prima Primary Care Provider: PATIENT, NO Other Clinician: Referring Provider: Referral, Self Treating Provider/Extender: STONE III, Suzanne Garbers Weeks in Treatment: 10 Verbal / Phone Orders: Yes Clinician: Carolyne [name], [name] Read Back and Verified: Yes Diagnosis Coding ICD-10 Coding Code Description I70.235 Atherosclerosis of native arteries of right leg with ulceration of other part of foot I70.232 Atherosclerosis of native arteries of right leg with ulceration of calf L97.512 Non-pressure chronic ulcer of other part of right foot with fat layer exposed I10 Essential (primary) hypertension Wound Cleansing Wound #7 Right,Dorsal Foot o Clean wound with Normal Saline. o Cleanse wound with mild soap and water o May Shower, gently pat wound dry prior to applying new dressing. Anesthetic (add to Medication List) Wound #7 Right,Dorsal Foot o Topical Lidocaine 4% cream applied to wound bed prior to debridement (In Clinic Only). Skin Barriers/Peri-Wound Care Wound #7 Right,Dorsal Foot o Skin Prep Primary Wound Dressing Wound #7  Right,Dorsal Foot o Silver Collagen - prisma ag Secondary Dressing Wound #7 Right,Dorsal Foot o Dry Gauze o Tegaderm Dressing Change Frequency Wound #  7 Right,Dorsal Foot o Change dressing every day. Follow-up Appointments Wound #7 Right,Dorsal Foot o Return Appointment in 2 weeks. Edema Control Wound #7 Right,Dorsal Foot [name], [name] ([id number]) o Elevate legs to the level of the heart and pump ankles as often as possible Off-Loading Wound #7 Right,Dorsal Foot o Turn and reposition every 2 hours Additional Orders / Instructions Wound #7 Right,Dorsal Foot o Increase protein intake. o Other: - Vitamin C, Zinc Patient Medications Allergies: codeine Notifications Medication Indication Start End lidocaine DOSE 1 - topical 4 % cream - 1 cream topical Electronic Signature(s) Signed: 01/03/2018 12:10:29 AM By: Worthy Keeler PA-C Signed: 01/03/2018 4:37:47 PM By: Alric Quan Entered By: Alric Quan on 01/02/2018 10:23:24 [name] ([id number]) -------------------------------------------------------------------------------- Prescription 01/02/2018 Patient Name: [name] Provider: Worthy Keeler PA-C Date of Birth: May 17, 1919 NPI#: [id number] Sex: F DEA#: [id number] Phone #: [phone] License #: Patient Address: [facility] [place], [place] [address] [address] [place], [place] [address] [phone] Allergies codeine Reaction: sick on stomach Severity: Severe Medication Medication: Route: Strength: Form: lidocaine topical 4% cream Class: TOPICAL LOCAL ANESTHETICS Dose: Frequency / Time: Indication: 1 1 cream topical Number of Refills: Number of Units: 0 Generic Substitution: Start Date: End Date: Administered at [place]: Yes Time Administered: Time Discontinued: Note to  Pharmacy: Signature(s): Date(s): Electronic Signature(s) Signed: 01/03/2018 12:10:29 AM By: Worthy Keeler PA-C Signed: 01/03/2018 4:37:47 PM By: Myriam Jacobson, Linet ([id number]) Entered By: Alric Quan on 01/02/2018 10:23:25 [name] ([id number]) --------------------------------------------------------------------------------  Problem List Details Patient Name: [name] Date of Service: 01/02/2018 9:30 AM Medical Record Number: [id number] Patient Account Number: [id number] Date of Birth/Sex: 10/17/1918 (84 y.o. F) Treating RN: Ahmed Prima Primary Care Provider: PATIENT, NO Other Clinician: Referring Provider: Referral, Self Treating Provider/Extender: STONE III, Shelle Galdamez Weeks in Treatment: 10 Active Problems ICD-10 Impacting Encounter Code Description Active Date Wound Healing Diagnosis I70.235 Atherosclerosis of native arteries of right leg with ulceration of 10/23/2017 Yes other part of foot I70.232 Atherosclerosis of native arteries of right leg with ulceration of 10/23/2017 Yes calf L97.512 Non-pressure chronic ulcer of other part of right foot with fat 10/23/2017 Yes layer exposed I10 Essential (primary) hypertension 10/24/2017 Yes Inactive Problems Resolved Problems Electronic Signature(s) Signed: 01/03/2018 12:10:29 AM By: Worthy Keeler PA-C Entered By: Worthy Keeler on 01/02/2018 09:52:18 [name] ([id number]) -------------------------------------------------------------------------------- Progress Note Details Patient Name: [name] Date of Service: 01/02/2018 9:30 AM Medical Record Number: [id number] Patient Account Number: [id number] Date of Birth/Sex: 1918-11-27 (84 y.o. F) Treating RN: Ahmed Prima Primary Care Provider: PATIENT, NO Other Clinician: Referring Provider: Referral, Self Treating Provider/Extender: STONE III, Asucena Galer Weeks in Treatment: 10 Subjective Chief Complaint Information obtained from  Patient Right foot Ulcers History of Present Illness (HPI) 04/06/16; this is a 84 year old woman who arrives accompanied by 2 daughters for a wound on her left ankle and her left fifth toe. These have apparently been present for a year. I'm not quite certain how she came to this clinic however she was being followed by Sharlotte Alamo her podiatrist for these wounds. She was also referred to [facility] and they apparently did a test presumably arterial studies although we don't have any of these results and we couldn't get through to the office today. The family but has been applying a combination of Bactroban and a light bandage and perhaps more recently Silvadene cream. She did have an x-ray of the  foot roughly 6 months ago at the podiatry office the family was unaware that if there were any abnormalities. Apparently they have not seen any healing here. Our intake nurse noted a slight skin tear on the right anterior lower leg. Nobody seemed aware of this. ABIs calculated in this clinic was 0.3 on the right and 0.4 on the left I have reviewed things in [facility]. There is very little information on this patient. She apparently follows in current total clinic which we don't have information from. She has mentioned already been to a [facility]. She has a history of hypothyroidism, nephrolithiasis arthritis and has had a previous mastectomy. 04/13/16; patient's x-ray was normal. She is already been to see Dr. Delana Meyer vascular surgery. Her arterial exam was from November 2016 this showed a left ABI of 0.62 her right of 0.76. Her duplex ultrasound of the left leg showed biphasic waves in the common femoral and distal femoral artery however monophasic waves in the superficial femoral artery proximal and mid biphasic it distal. 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 The patient tells me that she has pain at night when she tries to lie down which is improved by getting up and sitting in the  chair this sounds like claudication at rest. Her wounds are on the left medial malleolus and the dorsal fifth toe small punched out wounds that are right on bone. We use Santyl last week 04/20/16: nurse informed me pt has declined evaluation for significant PAD. she denies systemic s/s of infections. 04/27/16;; the patient has had noninvasive arterial studies done in November 2016. ABI and the left was 0.62. Monophasic waves at the superficial femoral artery. Occluded to the posterior tibial artery. So had greater than 50% stenosis of the right superficial femoral and greater than 50% stenosis of the left superficial femoral artery. She had bilateral tibial peroneal artery disease. Both of the wounds on the left fifth toe and left lateral malleolus have been present for more than a year. They have been to see vein and vascular. The patient has some pain but miraculously I think the wounds have largely been stable. No evidence of infection 05/04/16; she goes for a noninvasive study tomorrow and then sees Dr. Fletcher Anon on Monday. By the time she is here next week we should have a better picture of whether something can be done with regards to her arterial flow. We continue to have ischemic-looking wounds on the left fifth toe and left lateral malleolus. 05/10/16; the patient went for her arterial studies and saw Dr. Fletcher Anon. As predicted she is felt to have critical limb ischemia. The feeling is that she has occlusion of the SFA. The feeling would she would be a candidate for a stent to the SFA. The patient did not make a decision to proceed with the procedure and she is here with family members to discuss this me today. He shouldn't has a lot of pain and cannot sleep and rest well at night per her family. 05/24/16; the patient went and had a complex revascularization/angioplasty of the left superficial femoral artery followed by drug-coated balloon angioplasty and spots stenting. She tolerated the procedure  well. She was recommended for dual antiplatelet drugs with Plavix and aspirin for at least a month. She went yesterday for I believe follow-up serial Dopplers and ABIs although I don't see these results. The patient is unfortunately complaining of a lot of pain in her bilateral lower legs below the knees from the ankle to the knees. She apparently  was prescribed lidocaine and apparently put this on her legs instead of over the wound areas. This may have something to do with it however there is a lot of edema in her bilateral legs I was able to find her arterial studies from yesterday. The left ABI has improved up to 0.66 post left SFA stent. The bilateral [name], [name] ([id number]) great toe indices remain abnormal with the left being in the 0.25 range. Duplex ultrasound showed her left SFA stent is patent monophasic waveforms persist in the left leg 06/07/16; continued punched-out areas over the dorsal left fifth toe and left lateral malleolus. No major improvement 06/28/16; the areas over her dorsal left fifth toe and left lateral malleolus are covered in surface slough we are using Iodoflex 07/05/16. We have been using Iodoflex for 2-3 weeks now. I have not been debridement is because of pain 07/19/16 currently we have been using Iodoflex for roughly the past month. Previously we were unable to debride due to pain although today patient states that the pain is not nearly as severe as it has been in the past. In fact she rates this to be a 1 out of 10 and it worse to a to 10 with palpation of the wound. All and all her and her daughter feel like this is actually doing steadily better at this point in time. She is pleased with progress and we have been seeing her every 2 weeks. 08/02/16; this is a delightful 84 year old woman I have not seen in over a month. She has arterial insufficiency wounds remaining over the left lateral malleolus and the left fifth toe. With the help of Dr. Fletcher Anon we are able  to get her revascularized on the left. The 2 wounds on the left have been making good progress and are definitely smaller especially the area over the left lateral malleolus. She is certainly in a lot less pain than she used to be although I still think there is some claudication type pain. Unfortunately she is developed a area on the right lateral foot which is a small open area but I think is threatened. I suspect a small ischemic areas well. Also on her right fifth toe there is an area that is not open but looks as though it is receiving too much pressure from footwear. Finally an area over her right malleolus although I don't think this is on its way to anything ominous 08/17/16 patient presents today for follow up evaluation she tells me that she is really doing fairly well from a pain standpoint compared to where she has been previous. She is tolerating the dressing changes without any complication. She continues to have discharge and drainage however. 08-30-16 Ms. Pointer presents today with her daughter, she states that she continues to have intermittent shooting pains to the left lateral fifth toe at this site of seems to be a healed ulcer. She denies any other issues that her wound related since her last appointment. Her daughter states that she is in need of a tramadol refill at this time as she uses half a tablet at at bedtime to aid in sleep due to foot pain. Iodoflex has been used on all wounds in previous dressing changes. 09/13/16; the patient has ischemic wounds in her feet. The area over the left lateral malleolus is healed and the area over the left fifth toe looks improved.. She has an area on the lateral aspect of her right foot which is a small but deep wound. Finally she  has a new open area on the medial aspect of the left medial malleolus. This is superficial 09/27/16; the area over the left lateral malleolus remains healed. The area over the left fifth toe has a surface and  she states the pain is better but I don't think this is completely closed. She has an area on the lateral aspect of her right foot is a small but deep wound. The new open area on the medial aspect of the left ankle was closed from last time. 10/18/16; the area over her left lateral malleolus remains healed. The area over the left fifth toe has a surface over the top of this however there is no overt open area. Given the underlying issues of severe PAD and continued pain in the toe I would think it would be unlikely this is truly healed however I am not planning to debridement this area. The area on the right lateral foot which was a more recent wound is a small punched out painful area again has significant surface slough and nonviable tissue. It is clear the patient still has claudication type pain however she remains functional. I have been giving her tramadol when necessary and that seems to help a lot with her pain the patient follows up with Dr. Fletcher Anon on January 18/18 11/01/16; patient missed her follow-up with Dr. Fletcher Anon last week due to a snow day. Follow-up is now on February 15. The areas on the right lateral malleolus and dorsal right fifth toe remain closed over. The fifth toe was tentative is there is a surface eschar however I'm not going to disturb this. Therefore, her only open areas on the right lateral foot. This is a small punched out area. We have been using Prisma. I'm quite convinced this is an ischemic wound 11/15/16; patient has follow-up with Dr. Fletcher Anon on February 15. She has no open wound on the left leg and doesn't really complain of claudication that I can determine from talking to her. However on the right leg she clearly has some degree of claudication. The remaining open wound is on the right lateral foot. We have been using Iodosorb ointment 11/29/16; patient saw Dr. Fletcher Anon on 11/24/16. His comment is that her wound on her right lateral foot seems to be improving with local  wound care. She has known significant right SFA disease. Her lower extremity Doppler will be repeated and according to the patient's daughter that appointment is on March 15. He is left with the thought that endovascular intervention in the right SFA might be necessary. The patient has quite a bit of pain especially at night related to the wound in her right foot. We changed her to Iodosorb ointment last week 12/13/16; this is a patient I follow every 2 weeks largely on a palliative approach at this point about ischemic wounds currently in the right foot. Initially she had them on her left lateral malleolus and left fifth toe. The area on the left lateral malleolus healed and the fifth toe has a surface eschar that I have elected not to remove. Both of these improved after revascularization by Dr. Fletcher Anon. We have been using Iodosorb to the right lateral foot not much change here. 12/27/16; the patient had her arterial studies. This showed known bilateral SFA disease. Stable right ABI 0.5 to stable left ABI at 0.7 to the did not do it TBI on the right it was 0.61 on the left she has a stent in the long segment of her left SFA  from [name], [name] ([id number]) 05/18/16. All of her wounds are somewhat better. She states her pain is better in the right foot is improved. 01/10/17- patient is here for follow-up dilation of her right lateral foot ulcer. She has been tolerating Iodosorb. She is voices no complaints or concerns 01/24/17; small ischemic wound on right lateral foot. still non viable cover. follows with Dr. Fletcher Anon on 4/30. No open area on left foot 02/07/17 doing well and states she is pain free. Saw Dr. Fletcher Anon who said she is doing well and has "50% flow in the right leg and 70% in the left. According to her daughter he does not wish to do any further interventions 02/21/17; small ischemic wound on the right lateral foot. Per her daughter and the patient she has no open wound on the left foot and ankle  which were her initial presenting wounds. still has claudication type pain at night she is been to see interventional cardiology who does not feel that she has a need for intervention on the right leg at present. She still has claudication type pain in the right leg which she manages at night with when necessary tramadol that I have prescribed 03/14/17; small ischemic wound on the right lateral foot. They've been dressing this at home with Iodosorb ointment. The patient does not complain of any pain. The wound has a surface eschar over it and there is really no visible open area. This is similar to how the areas on the left leg healed 04/11/17; the small ischemic wound on her right lateral foot is finally closed over. Small amount of eschar over the surface however I did not attempt to remove this. The patient claims to be asymptomatic she is not having any claudication that I'm able to elicit although her activity is limited Readmission: 10/23/17 on evaluation today patient appears to be doing somewhat poorly in regard to her right foot where she has two ulcers at this point. The worst is on the lateral aspect of her foot medial first metatarsal region is not nearly as bad. With that being said these did arise seemingly out of nowhere she has previously had a similar issue in the past these have always been ischemic wounds due to arterial insufficiency. She does see Dr. Fletcher Anon as her vascular specialist and it has been noted that her ABI's are somewhat low. She actually has a repeat evaluation coming up this March 2019 to reevaluate her blood flow. On the last check 12/22/16 it was noted that patient had stable ABI's in the lower moderate abnormal range in regard to the right in regard to the left and the upper moderate at normal range. Patient is having some discomfort though nothing severe at this point in time. She is seen with her daughter and son-in-law today. No fevers, chills, nausea, or vomiting  noted at this time. 10/30/17 on evaluation today patient appears to be doing well in regard to her wounds. The right lateral foot wound appears to show signs of cleaning up nicely there is granulation noted underneath that the bed of the wound although there still some [facility] covering. Nonetheless she still has a lot of discomfort and I really do not want to proceed with debridement due to the fact that she does have so much discomfort. Good news is the tramadol has been helping her at bedtime she only takes this once a day and that has been extremely beneficial. Unfortunately she does not have a primary right now as she is  awaiting a new one which is the reason that I am prescribing the tramadol. Nonetheless I'm pleased with how things seem to be progressing in regard to her ulcers. 11/06/17 on evaluation today patient appears to be doing decently well as far as maintaining in regard to her ulcers. Unfortunately I did review her arterial study which shows that she has on the right arterial obstruction involving the superficial femoral and popliteal artery as well is the superficial femoral artery with a high grade stenosis versus inclusion. Collateral flow noted in the distal portion of the SFA. Severe progression is noted compared to previous study. Unfortunately being the patient's wounds do not seem to be healing as appropriately and quickly as we would like I do believe this is likely a result of poor vascular flow to the right lower extremity. This was discussed with patient and her daughter during the office visit today. 11/14/17 on evaluation today patient continues to have a fairly stalled ulcer in regard to her to ulcers on the right lower extremity. She obviously did have significant findings on her arterial study and I do believe this is again due to poor vascular flow. She does have an appointment later this afternoon with her vascular specialist for further evaluation to see if there  any recommendations at that point. With that being said she continues to have discomfort which I do believe is arterial in nature in regard to insufficiency. 11/21/17 on evaluation today patient appears to be doing fairly well in regard to the appearance of her wounds other than the fact that she does have erythema surrounding the wound bed at this point in fact this encompasses the majority of the dorsal surface of her right foot. There is additional granulation noted today that was not noted previously on evaluation and again if that were alone were the findings that we were seeing I would be very happy with the current progress. However unfortunately she is having the erythema which concerns me for the possibility of infection although the other possibility is that she could just be having a local reaction due to the I resort. In the past she never had this issue however when she use this for the root left foot which makes me again come back to my concern about infection. [name], [name] ([id number]) 11/28/17 on evaluation today patient is status post having had an angiogram performed on 11/22/17 where it was noted that she had severe disease involving the TP trunk and proximal peroneal artery. Subsequently she underwent successful atherectomy and drug coated balloon angioplasty to the right SFA, TP trunk, and proximal peroneal artery. Patient stated only of has 10% residual stenosis and overall the procedure at the time was both tolerated well as well as considered a success. This was performed by Dr. Sophronia Simas. Patient was subsequently discharged home on 11/23/17 from the hospital. Overall she states that her leg pain is not nearly as severe as what it was previous which is excellent news. She also has been sleeping with her leg in the bed previously she was hanging this over the edge due to the pain. She also seems to have much better blood flow on evaluation today. 12/05/17 on evaluation today  patient appears to be doing very well in regard to her quite lateral great toe as well as the right lateral foot ulcer. She has excellent blood flow and great capillary refill noted on evaluation today which is excellent news. With that being said she does have discomfort but mainly at  the site of the wounds especially lateral foot wound but nothing like what she was experiencing previously. This is rated to be a 3/10 at most. She is seen with her daughter present. 12/12/17 on evaluation today patient appears to be doing very well in regard to her right foot and right medial great toe ulcers. She has been tolerating the dressing changes without complication. She doesn't seem to have any significant discomfort which is also good news. Overall she has been doing excellent since her vascular intervention in my opinion. She actually does have follow-up later today with vascular for a repeat arterial study to ensure everything seems to be doing well following her surgery. 12/19/17 on evaluation today patient appears to be doing excellent in regard to her lower extremity ulcers. In fact the right medial great toe ulcer has completely resolved at this point which is excellent news. The right lateral foot ulcer seems to be showing signs of improving week by week and obviously this is good news as well. Overall I'm very pleased with the progress that she has made up to this point. She does have some Slough covering the lateral foot wound which does require sharp debridement today. I did review her vascular note from Dr. Dellis Filbert and it appears that everything seems to be doing well from a vascular standpoint and the patient's foot continues to be warm and appears well vascularized. 01/02/18 on evaluation today patient appears to be doing well in regard to her right lateral foot ulcer. She has been tolerating the dressing changes without complication. There does not appear to be any evidence of infection at this  time which is also good news. She has definite new granulation noted. Patient History Information obtained from Patient. Family History Diabetes - Mother, Heart Disease - Siblings, Stroke - Siblings, No family history of Cancer, Hereditary Spherocytosis, Hypertension, Kidney Disease, Lung Disease, Seizures, Thyroid Problems, Tuberculosis. Social History Never smoker, Marital Status - Widowed, Alcohol Use - Never, Drug Use - No History, Caffeine Use - Daily. Review of Systems (ROS) Constitutional Symptoms (General Health) Denies complaints or symptoms of Fever, Chills. Respiratory The patient has no complaints or symptoms. Cardiovascular The patient has no complaints or symptoms. Psychiatric The patient has no complaints or symptoms. [name], [name] ([id number]) Objective Constitutional Well-nourished and well-hydrated in no acute distress. Vitals Time Taken: 9:46 AM, Height: 64 in, Weight: 156 lbs, BMI: 26.8, Temperature: 98.0 F, Pulse: 94 bpm, Respiratory Rate: 16 breaths/min, Blood Pressure: 160/56 mmHg. Respiratory normal breathing without difficulty. Psychiatric this patient is able to make decisions and demonstrates good insight into disease process. Alert and Oriented x 3. pleasant and cooperative. General Notes: Patient does not appear to have any significant slough that could not be sharply debrided away I was not able to clean this off with saline and gauze only and therefore sharp debridement was performed utilizing a curette which she tolerated well without complication. Post debridement she seemed to be doing much better in regard to the granular surface of the wound and overall though there is fascia exposed this wound seems to be doing better. Integumentary (Hair, Skin) Wound #7 status is Open. Original cause of wound was Gradually Appeared. The wound is located on the Right,Dorsal Foot. The wound measures 0.8cm length x 1cm width x 0.1cm depth; 0.628cm^2 area and  0.063cm^3 volume. There is Fat Layer (Subcutaneous Tissue) Exposed exposed. There is no tunneling or undermining noted. There is a large amount of serous drainage noted. The wound margin is distinct  with the outline attached to the wound base. There is medium (34-66%) pink granulation within the wound bed. There is a medium (34-66%) amount of necrotic tissue within the wound bed including Adherent Slough. The periwound skin appearance exhibited: Induration, Maceration, Erythema. The surrounding wound skin color is noted with erythema which is circumferential. Periwound temperature was noted as No Abnormality. The periwound has tenderness on palpation. 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 There was a Excisional Skin/Subcutaneous Tissue Debridement with a total area of [name], [name] ([id number]) 0.8 sq cm performed by STONE III, Garth Diffley E., PA-C. With the following instrument(s): Curette. to remove Viable and Non- Viable tissue/material Material removed includes Subcutaneous Tissue, and Slough, Fibrin/Exudate, and Slough after achieving pain control using Lidocaine 4% Topical Solution. No specimens were taken. A time out was conducted at 10:18, prior to the start of the procedure. A Minimum amount of bleeding was controlled with Pressure. The procedure was tolerated well with a pain level of 0 throughout and a pain level of 0 following the procedure. Post Debridement Measurements: 0.8cm length x 1cm width x 0.2cm depth;  0.126cm^3 volume. Character of Wound/Ulcer Post Debridement requires further debridement. Severity of Tissue Post Debridement is: Fat layer exposed. Post procedure Diagnosis Wound #7: Same as Pre-Procedure Plan Wound Cleansing: Wound #7 Right,Dorsal Foot: Clean wound with Normal Saline. Cleanse wound with mild soap and water May Shower, gently pat wound dry prior to applying new dressing. Anesthetic (add to Medication List): Wound #7 Right,Dorsal Foot: Topical Lidocaine 4% cream applied to wound bed prior to debridement (In Clinic Only). Skin Barriers/Peri-Wound Care: Wound #7 Right,Dorsal Foot: Skin Prep Primary Wound Dressing: Wound #7 Right,Dorsal Foot: Silver Collagen - prisma ag Secondary Dressing: Wound #7 Right,Dorsal Foot: Dry Gauze Tegaderm Dressing Change Frequency: Wound #7 Right,Dorsal Foot: Change dressing every day. Follow-up Appointments: Wound #7 Right,Dorsal Foot: Return Appointment in 2 weeks. Edema Control: Wound #7 Right,Dorsal Foot: Elevate legs to the level of the heart and pump ankles as often as possible Off-Loading: Wound #7 Right,Dorsal Foot: Turn and reposition every 2 hours Additional Orders / Instructions: Wound #7 Right,Dorsal Foot: Increase protein intake. Other: - Vitamin C, Zinc The following medication(s) was prescribed: lidocaine topical 4 % cream 1 1 cream topical was prescribed at facility [facility], [name] ([id number]) At this point I'm going to recommend that we continue with the Current wound care measures and she seems to be doing so well. Patient and her daughter are in agreement with the plan. I'm very pleased with her progress. We will see her for reevaluation in two weeks time. Please see above for specific wound care orders. We will see patient for re-evaluation in 2 week(s) here in the clinic. If anything worsens or changes patient will contact our office for additional recommendations. Electronic Signature(s) Signed:  01/03/2018 12:10:29 AM By: Worthy Keeler PA-C Entered By: Worthy Keeler on 01/02/2018 12:40:00 [name] ([id number]) -------------------------------------------------------------------------------- ROS/PFSH Details Patient Name: [name] Date of Service: 01/02/2018 9:30 AM Medical Record Number: [id number] Patient Account Number: [id number] Date of Birth/Sex: Mar 22, 1919 (84 y.o. F) Treating  RN: Ahmed Prima Primary Care Provider: PATIENT, NO Other Clinician: Referring Provider: Referral, Self Treating Provider/Extender: STONE III, Tahjanae Blankenburg Weeks in Treatment: 10 Information Obtained From Patient Wound History Do you currently have one or more open woundso Yes How many open wounds do you currently haveo 1 Approximately how long have you had your woundso 1.5 weeks How have you been treating your wound(s) until nowo bandaide Has your wound(s) ever healed and then re-openedo No Have you had any lab work done in the past montho No Have you tested positive for an antibiotic resistant organism (MRSA, VRE)o No Have you tested positive for osteomyelitis (bone infection)o No Have you had any tests for circulation on your legso Yes [facility] the test [facility] Have you had other problems associated with your woundso Swelling Constitutional Symptoms (General Health) Complaints and Symptoms: Negative for: Fever; Chills Eyes Medical History: Positive for: Cataracts - had surgery Respiratory Complaints and Symptoms: No Complaints or Symptoms Cardiovascular Complaints and Symptoms: No Complaints or Symptoms Medical History: Positive for: Hypertension Musculoskeletal Medical History: Positive for: Osteoarthritis Oncologic Medical History: Negative for: Received Chemotherapy; Received Radiation Psychiatric KEOSHIA, STEINMETZ ([id number]) Complaints and Symptoms: No Complaints or Symptoms HBO Extended History Items Eyes: Cataracts Immunizations Pneumococcal  Vaccine: Received Pneumococcal Vaccination: Yes Implantable Devices Family and Social History Cancer: No; Diabetes: Yes - Mother; Heart Disease: Yes - Siblings; Hereditary Spherocytosis: No; Hypertension: No; Kidney Disease: No; Lung Disease: No; Seizures: No; Stroke: Yes - Siblings; Thyroid Problems: No; Tuberculosis: No; Never smoker; Marital Status - Widowed; Alcohol Use: Never; Drug Use: No History; Caffeine Use: Daily; Financial Concerns: No; Food, Clothing or Shelter Needs: No; Support System Lacking: No; Transportation Concerns: No; Advanced Directives: No; Patient does not want information on Advanced Directives; Do not resuscitate: No; Living Will: No; Medical Power of Attorney: No Physician Affirmation I have reviewed and agree with the above information. Electronic Signature(s) Signed: 01/03/2018 12:10:29 AM By: Worthy Keeler PA-C Signed: 01/03/2018 4:37:47 PM By: Alric Quan Entered By: Worthy Keeler on 01/02/2018 12:39:01 [name] ([id number]) -------------------------------------------------------------------------------- SuperBill Details Patient Name: [name] Date of Service: 01/02/2018 Medical Record Number: [id number] Patient Account Number: [id number] Date of Birth/Sex: 1919/09/16 (84 y.o. 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 I10 Essential (primary) hypertension Facility Procedures CPT4 Code: [id number] Description: 14970 - DEB SUBQ TISSUE 20 SQ CM/< ICD-10 Diagnosis Description L97.512  Non-pressure chronic ulcer of other part of right foot with fat l Modifier: ayer exposed Quantity: 1 Physician Procedures CPT4 Code: [id number] Description: 85027 - WC PHYS SUBQ TISS 20 SQ CM ICD-10 Diagnosis Description L97.512 Non-pressure chronic ulcer of other part of right foot with fat l Modifier: ayer exposed Quantity: 1 Electronic Signature(s) Signed: 01/03/2018 12:10:29 AM By: Worthy Keeler PA-C Entered By: Worthy Keeler on 01/02/2018 12:40:12

## 2018-01-05 ENCOUNTER — Encounter (INDEPENDENT_AMBULATORY_CARE_PROVIDER_SITE_OTHER): Payer: Medicare PPO | Admitting: Ophthalmology

## 2018-01-05 DIAGNOSIS — H35033 Hypertensive retinopathy, bilateral: Secondary | ICD-10-CM

## 2018-01-05 DIAGNOSIS — H353231 Exudative age-related macular degeneration, bilateral, with active choroidal neovascularization: Secondary | ICD-10-CM | POA: Diagnosis not present

## 2018-01-05 DIAGNOSIS — I10 Essential (primary) hypertension: Secondary | ICD-10-CM | POA: Diagnosis not present

## 2018-01-05 DIAGNOSIS — H43813 Vitreous degeneration, bilateral: Secondary | ICD-10-CM

## 2018-01-16 ENCOUNTER — Encounter: Payer: Medicare PPO | Attending: Physician Assistant | Admitting: Physician Assistant

## 2018-01-16 DIAGNOSIS — L97512 Non-pressure chronic ulcer of other part of right foot with fat layer exposed: Secondary | ICD-10-CM | POA: Insufficient documentation

## 2018-01-16 DIAGNOSIS — I70232 Atherosclerosis of native arteries of right leg with ulceration of calf: Secondary | ICD-10-CM | POA: Insufficient documentation

## 2018-01-16 DIAGNOSIS — I10 Essential (primary) hypertension: Secondary | ICD-10-CM | POA: Insufficient documentation

## 2018-01-18 NOTE — Progress Notes (Signed)
SUKARI, GRIST ([id number]) Visit Report for 01/16/2018 Arrival Information Details Patient Name: [name], [name] Date of Service: 01/16/2018 10:00 AM Medical Record Number: [id number] Patient Account Number: [id number] Date of Birth/Sex: 08/08/1919 (84 y.o. F) Treating RN: Roger Shelter Primary Care Donavon Kimrey: PATIENT, NO Other Clinician: Referring Keyondre Hepburn: Referral, Self Treating Franci Oshana/Extender: STONE III, HOYT Weeks in Treatment: 12 Visit Information History Since Last Visit All ordered tests and consults were completed: No Patient Arrived: Walker Added or deleted any medications: No Arrival Time: 10:09 Any new allergies or adverse reactions: No Accompanied By: daughter Had a fall or experienced change in No Transfer Assistance: None activities of daily living that may affect Patient Identification Verified: Yes risk of falls: Secondary Verification Process Completed: Yes Signs or symptoms of abuse/neglect since last visito No Patient Requires Transmission-Based Precautions: No Hospitalized since last visit: No Patient Has Alerts: No Implantable device outside of the clinic excluding No cellular tissue based products placed in the center since last visit: Pain Present Now: No Electronic Signature(s) Signed: 01/16/2018 4:09:12 PM By: Roger Shelter Entered By: Roger Shelter on 01/16/2018 10:10:01 [name] ([id number]) -------------------------------------------------------------------------------- Encounter Discharge Information Details Patient Name: [name] Date of Service: 01/16/2018 10:00 AM Medical Record Number: [id number] Patient Account Number: [id number] Date of Birth/Sex: [birth year] (84 y.o. F) Treating RN: Roger Shelter Primary Care Martavious Hartel: PATIENT, NO Other Clinician: Referring Jlynn Ly: Referral, Self Treating Shantanique Hodo/Extender: STONE III, HOYT Weeks in Treatment: 12 Encounter Discharge Information Items Discharge Pain Level:  0 Discharge Condition: Stable Ambulatory Status: Walker Discharge Destination: Home Private Transportation: Auto Accompanied By: daughter Schedule Follow-up Appointment: Yes Medication Reconciliation completed and provided No to Patient/Care Celica Kotowski: Clinical Summary of Care: Electronic Signature(s) Signed: 01/16/2018 4:09:12 PM By: Roger Shelter Entered By: Roger Shelter on 01/16/2018 10:42:09 [name] ([id number]) -------------------------------------------------------------------------------- Lower Extremity Assessment Details Patient Name: [name] Date of Service: 01/16/2018 10:00 AM Medical Record Number: [id number] Patient Account Number: [id number] Date of Birth/Sex: 03/21/1919 (84 y.o. F) Treating RN: Roger Shelter Primary Care Thurmond Hildebran: PATIENT, NO Other Clinician: Referring Jamika Sadek: Referral, Self Treating Aylla Huffine/Extender: STONE III, HOYT Weeks in Treatment: 12 Edema Assessment Assessed: [Left: No] [Right: No] Edema: [Left: N] [Right: o] Vascular Assessment Claudication: Claudication Assessment [Right:None] Pulses: Dorsalis Pedis Palpable: [Right:Yes] Posterior Tibial Extremity colors, hair growth, and conditions: Extremity Color: [Right:Normal] Hair Growth on Extremity: [Right:No] Temperature of Extremity: [Right:Warm] Capillary Refill: [Right:< 3 seconds] Toe Nail Assessment Left: Right: Thick: Yes Discolored: Yes Deformed: No Improper Length and Hygiene: Yes Electronic Signature(s) Signed: 01/16/2018 4:09:12 PM By: Roger Shelter Entered By: Roger Shelter on 01/16/2018 10:18:41 [name] ([id number]) -------------------------------------------------------------------------------- Multi Wound Chart Details Patient Name: [name] Date of Service: 01/16/2018 10:00 AM Medical Record Number: [id number] Patient Account Number: [id number] Date of Birth/Sex: 1919/05/26 (84 y.o. 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] [N/A:N/A] Comorbid History: [7:Cataracts, Hypertension, Osteoarthritis] [N/A:N/A] Date Acquired: [7:10/11/2017] [N/A:N/A] Weeks of Treatment: [7:12] [N/A:N/A] Wound Status: [7:Open] [N/A:N/A] Measurements L x W x D [7:0.5x0.7x0.2] [N/A:N/A] (cm) Area (cm) : [7:0.275] [N/A:N/A] Volume (cm) : [7:0.055] [N/A:N/A] % Reduction in Area: [7:51.30%] [N/A:N/A] % Reduction in Volume: [7:51.30%] [N/A:N/A] Classification: [7:Partial Thickness] [N/A:N/A] Exudate Amount: [7:Large] [N/A:N/A] Exudate Type: [7:Serous] [  N/A:N/A] Exudate Color: [7:amber] [N/A:N/A] Wound Margin: [7:Distinct, outline attached] [N/A:N/A] Granulation Amount: [7:Medium (34-66%)] [N/A:N/A] Granulation Quality: [7:Pink] [N/A:N/A] Necrotic Amount: [7:Medium (34-66%)] [N/A:N/A] Exposed Structures: [7:Fat Layer (Subcutaneous Tissue) Exposed: Yes Fascia: No Tendon: No Muscle: No Joint: No Bone: No] [N/A:N/A] Epithelialization: [7:Small (1-33%)] [N/A:N/A] Periwound Skin Texture: [7:Excoriation: No Induration: No Callus: No Crepitus: No Rash: No Scarring: No] [N/A:N/A] Periwound Skin Moisture: Maceration: No N/A N/A Dry/Scaly: No Periwound Skin Color: Atrophie Blanche: No N/A N/A Cyanosis: No Ecchymosis: No Erythema: No Hemosiderin Staining: No Mottled: No Pallor: No Rubor: No Temperature: No Abnormality N/A N/A Tenderness on Palpation: Yes N/A N/A Wound Preparation: Ulcer Cleansing: N/A N/A Rinsed/Irrigated with  Saline Topical Anesthetic Applied: Other: lidocaine 4% Treatment Notes Electronic Signature(s) Signed: 01/16/2018 5:03:17 PM By: Alric Quan Entered By: Alric Quan on 01/16/2018 10:26:49 [name] ([id number]) -------------------------------------------------------------------------------- [place] Details Patient Name: [name] Date of Service: 01/16/2018 10:00 AM Medical Record Number: [id number] Patient Account Number: [id number] Date of Birth/Sex: 10/26/1918 (84 y.o. F) Treating RN: Ahmed Prima Primary Care Jameson Tormey: PATIENT, NO Other Clinician: Referring Laurielle Selmon: Referral, Self Treating Nou Chard/Extender: STONE III, HOYT Weeks in Treatment: 12 Active Inactive ` Abuse / Safety / Falls / Self Care Management Nursing Diagnoses: History of Falls Potential for falls Goals: Patient will not experience any injury related to falls Date Initiated: 10/23/2017 Target Resolution Date: 02/17/2018 Goal Status: Active Interventions: Assess Activities of Daily Living upon admission and as needed Assess fall risk on admission and as needed Assess: immobility, friction, shearing, incontinence upon admission and as needed Assess impairment of mobility on admission and as needed per policy Assess personal safety and home safety (as indicated) on admission and as needed Assess self care needs on admission and as needed Notes: ` Nutrition Nursing Diagnoses: Imbalanced nutrition Potential for alteratiion in Nutrition/Potential for imbalanced nutrition Goals: Patient/caregiver agrees to and verbalizes understanding of need to use nutritional supplements and/or vitamins as prescribed Date Initiated: 10/23/2017 Target Resolution Date: 02/17/2018 Goal Status: Active Interventions: Assess patient nutrition upon admission and as needed per policy Notes: ` Orientation to the Wound Care Program Nursing Diagnoses: [name], [name]  ([id number]) Knowledge deficit related to the wound healing center program Goals: Patient/caregiver will verbalize understanding of the [facility] Program Date Initiated: 10/23/2017 Target Resolution Date: 11/18/2017 Goal Status: Active Interventions: Provide education on orientation to the wound center Notes: ` Pain, Acute or Chronic Nursing Diagnoses: Pain, acute or chronic: actual or potential Potential alteration in comfort, pain Goals: Patient/caregiver will verbalize adequate pain control between visits Date Initiated: 10/23/2017 Target Resolution Date: 02/17/2018 Goal Status: Active Interventions: Complete pain assessment as per visit requirements Notes: ` Wound/Skin Impairment Nursing Diagnoses: Impaired tissue integrity Knowledge deficit related to ulceration/compromised skin integrity Goals: Ulcer/skin breakdown will have a volume reduction of 80% by week 12 Date Initiated: 10/23/2017 Target Resolution Date: 02/17/2018 Goal Status: Active Interventions: Assess patient/caregiver ability to perform ulcer/skin care regimen upon admission and as needed Assess ulceration(s) every visit Notes: Electronic Signature(s) Signed: 01/16/2018 5:03:17 PM By: Alric Quan Entered By: Alric Quan on 01/16/2018 10:26:39 [name] ([id number]) -------------------------------------------------------------------------------- Pain Assessment Details Patient Name: [name] Date of Service: 01/16/2018 10:00 AM Medical Record Number: [id number] Patient Account Number: [id number] Date of Birth/Sex: January 14, 1919 (84 y.o. F) Treating RN: Roger Shelter Primary Care Antoniette Peake: PATIENT, NO Other Clinician: Referring Zivah Mayr: Referral, Self Treating Sydney Hasten/Extender: STONE III, HOYT Weeks in Treatment: 12 Active Problems Location of Pain Severity and Description of Pain Patient Has Paino No Site Locations Pain  Management and Medication Current Pain  Management: Electronic Signature(s) Signed: 01/16/2018 4:09:12 PM By: Roger Shelter Entered By: Roger Shelter on 01/16/2018 10:10:08 [name] ([id number]) -------------------------------------------------------------------------------- Patient/Caregiver Education Details Patient Name: [name] Date of Service: 01/16/2018 10:00 AM Medical Record Number: [id number] Patient Account Number: [id number] Date of Birth/Gender: Mar 05, 1919 (84 y.o. F) Treating RN: Roger Shelter Primary Care Physician: PATIENT, NO Other Clinician: Referring Physician: Referral, Self Treating Physician/Extender: Melburn Hake, HOYT Weeks in Treatment: 12 Education Assessment Education Provided To: Patient Education Topics Provided Wound Debridement: Handouts: Wound Debridement Methods: Explain/Verbal Responses: State content correctly Wound/Skin Impairment: Methods: Explain/Verbal Responses: State content correctly Electronic Signature(s) Signed: 01/16/2018 4:09:12 PM By: Roger Shelter Entered By: Roger Shelter on 01/16/2018 10:43:27 [name] ([id number]) -------------------------------------------------------------------------------- Wound Assessment Details Patient Name: [name] Date of Service: 01/16/2018 10:00 AM Medical Record Number: [id number] Patient Account Number: [id number] Date of Birth/Sex: [birth year] (84 y.o. F) Treating RN: Roger Shelter Primary Care Hershy Flenner: PATIENT, NO Other Clinician: Referring Lujuana Kapler: Referral, Self Treating Rabab Currington/Extender: STONE III, HOYT Weeks in Treatment: 12 Wound Status Wound Number: 7 Primary Etiology: Arterial Insufficiency Ulcer Wound Location: Right Foot - Dorsal Wound Status: Open Wounding Event: Gradually Appeared Comorbid History: Cataracts, Hypertension, Osteoarthritis Date Acquired: 10/11/2017 Weeks Of Treatment: 12 Clustered Wound: No Photos Photo Uploaded By: Roger Shelter on 01/16/2018 16:26:46 Wound  Measurements Length: (cm) 0.5 Width: (cm) 0.7 Depth: (cm) 0.2 Area: (cm) 0.275 Volume: (cm) 0.055 % Reduction in Area: 51.3% % Reduction in Volume: 51.3% Epithelialization: Small (1-33%) Tunneling: No Undermining: No Wound Description Classification: Partial Thickness Wound Margin: Distinct, outline attached Exudate Amount: Large Exudate Type: Serous Exudate Color: amber Foul Odor After Cleansing: No Slough/Fibrino Yes Wound Bed Granulation Amount: Medium (34-66%) Exposed Structure Granulation Quality: Pink Fascia Exposed: No Necrotic Amount: Medium (34-66%) Fat Layer (Subcutaneous Tissue) Exposed: Yes Necrotic Quality: Adherent Slough Tendon Exposed: No Muscle Exposed: No Joint Exposed: No Bone Exposed: No Periwound Skin Texture [name], [name] ([id number]) Texture Color No Abnormalities Noted: No No Abnormalities Noted: No Callus: No Atrophie Blanche: No Crepitus: No Cyanosis: No Excoriation: No Ecchymosis: No Induration: No Erythema: No Rash: No Hemosiderin Staining: No Scarring: No Mottled: No Pallor: No Moisture Rubor: No No Abnormalities Noted: No Dry / Scaly: No Temperature / Pain Maceration: No Temperature: No Abnormality Tenderness on Palpation: Yes Wound Preparation Ulcer Cleansing: Rinsed/Irrigated with Saline Topical Anesthetic Applied: Other: lidocaine 4%, Treatment Notes Wound #7 (Right, Dorsal Foot) 1. Cleansed with: Clean wound with Normal Saline 2. Anesthetic Topical Lidocaine 4% cream to wound bed prior to debridement 4. Dressing Applied: Prisma Ag 5. Secondary Dressing Applied Dry Gauze Tegaderm Electronic Signature(s) Signed: 01/16/2018 4:09:12 PM By: Roger Shelter Entered By: Roger Shelter on 01/16/2018 10:17:46 [name] ([id number]) -------------------------------------------------------------------------------- Vitals Details Patient Name: [name] Date of Service: 01/16/2018 10:00 AM Medical  Record Number: [id number] Patient Account Number: [id number] Date of Birth/Sex: 1919-09-19 (84 y.o. F) Treating RN: Roger Shelter Primary Care Jazelyn Sipe: PATIENT, NO Other Clinician: Referring Tomeika Weinmann: Referral, Self Treating Loryn Haacke/Extender: STONE III, HOYT Weeks in Treatment: 12 Vital Signs Time Taken: 10:11 Temperature (F): 97.5 Height (in): 64 Pulse (bpm): 72 Weight (lbs): 156 Respiratory Rate (breaths/min): 16 Body Mass Index (BMI): 26.8 Blood Pressure (mmHg): 137/62 Reference Range: 80 - 120 mg / dl Electronic Signature(s) Signed: 01/16/2018 4:09:12 PM By: Roger Shelter Entered By: Roger Shelter on 01/16/2018 10:13:13

## 2018-01-19 ENCOUNTER — Ambulatory Visit: Payer: Medicare PPO | Admitting: Cardiovascular Disease

## 2018-01-26 NOTE — Progress Notes (Signed)
[name], [name] ([id number]) Visit Report for 01/16/2018 Chief Complaint Document Details Patient Name: [name], [name] Date of Service: 01/16/2018 10:00 AM Medical Record Number: [id number] Patient Account Number: [id number] Date of Birth/Sex: 1919/04/14 (84 y.o. F) Treating RN: Ahmed Prima Primary Care Provider: PATIENT, NO Other Clinician: Referring Provider: Referral, Self Treating Provider/Extender: STONE III, HOYT Weeks in Treatment: 12 Information Obtained from: Patient Chief Complaint Right foot Ulcers Electronic Signature(s) Signed: 01/16/2018 8:41:23 PM By: Worthy Keeler PA-C Entered By: Worthy Keeler on 01/16/2018 10:24:28 [name] ([id number]) -------------------------------------------------------------------------------- Debridement Details Patient Name: [name] Date of Service: 01/16/2018 10:00 AM Medical Record Number: [id number] Patient Account Number: [id number] Date of Birth/Sex: 03/30/1919 (84 y.o. F) Treating RN: Ahmed Prima Primary Care Provider: PATIENT, NO Other Clinician: Referring Provider: Referral, Self Treating Provider/Extender: STONE III, HOYT Weeks in Treatment: 12 Debridement Performed for Wound #7 Right,Dorsal Foot Assessment: Performed By: Physician STONE III, HOYT E., PA-C Debridement Type: Debridement Severity of Tissue Pre Fat layer exposed Debridement: Pre-procedure Verification/Time Yes - 10:27 Out Taken: Start Time: 10:28 Pain Control: Lidocaine 4% Topical Solution Total Area Debrided (L x W): 0.5 (cm) x 0.7 (cm) = 0.35 (cm) Tissue and other material Viable, Non-Viable, Subcutaneous, Biofilm debrided: Level: Skin/Subcutaneous Tissue Debridement Description: Excisional Instrument: Curette Bleeding: Minimum Hemostasis Achieved: Pressure End Time: 10:30 Procedural Pain: 0 Post Procedural Pain: 0 Response to Treatment: Procedure was tolerated well Post Debridement Measurements of Total Wound Length: (cm)  0.5 Width: (cm) 0.7 Depth: (cm) 0.3 Volume: (cm) 0.082 Character of Wound/Ulcer Post Debridement: Requires Further Debridement Severity of Tissue Post Debridement: Fat layer exposed Post Procedure Diagnosis Same as Pre-procedure Electronic Signature(s) Signed: 01/16/2018 5:03:17 PM By: Alric Quan Signed: 01/16/2018 8:41:23 PM By: Worthy Keeler PA-C Entered By: Alric Quan on 01/16/2018 10:30:52 [name] ([id number]) -------------------------------------------------------------------------------- HPI Details Patient Name: [name] Date of Service: 01/16/2018 10:00 AM Medical Record Number: [id number] Patient Account Number: [id number] Date of Birth/Sex: [birth year] (84 y.o. F) Treating RN: Ahmed Prima Primary Care Provider: PATIENT, NO Other Clinician: Referring Provider: Referral, Self Treating Provider/Extender: STONE III, HOYT Weeks in Treatment: 12 History of Present Illness HPI Description: 04/06/16; this is a 84 year old woman who arrives accompanied by 2 daughters for a wound on her left ankle and her left fifth toe. These have apparently [name] present for a year. I'm not quite certain how she came to this clinic however she was being followed by Sharlotte Alamo her podiatrist for these wounds. She was also referred to [facility] and they apparently did a test presumably arterial studies although we don't have any of these results and we couldn't get through to the office today. The family but has [name] applying a combination of Bactroban and a light bandage and perhaps more recently Silvadene cream. She did have an x-ray of the foot roughly 6 months ago at the podiatry office the family was unaware that if there were any abnormalities. Apparently they have not seen any healing here. Our intake nurse noted a slight skin tear on the right anterior lower leg. Nobody seemed aware of this. ABIs calculated in this clinic was 0.3 on the right and 0.4  on the left I have reviewed things in [facility]. There is very little information on this patient. She apparently follows in current total clinic which we don't have information from. She has mentioned already [name] to a [facility]. She has a history of hypothyroidism, nephrolithiasis arthritis and has had a previous mastectomy. 04/13/16; patient's x-ray was normal.  She is already [name] to see Dr. Delana Meyer vascular surgery. Her arterial exam was from November 2016 this showed a left ABI of 0.62 her right of 0.76. Her duplex ultrasound of the left leg showed biphasic waves in the common femoral and distal femoral artery however monophasic waves in the superficial femoral artery proximal and mid biphasic it distal. 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 The patient tells me that she has pain at night when she tries to lie down which is improved by getting up and sitting in the chair this sounds like claudication at rest. Her wounds are on the left medial malleolus and the dorsal fifth toe small punched out wounds that are right on bone. We use Santyl last week 04/20/16: nurse informed me pt has declined evaluation for significant PAD. she denies systemic s/s of infections. 04/27/16;; the patient has had noninvasive arterial studies done in November 2016. ABI and the left was 0.62. Monophasic waves at the superficial femoral artery. Occluded to the posterior tibial artery. So had greater than 50% stenosis of the right superficial femoral and greater than 50% stenosis of the left superficial femoral artery. She had bilateral tibial peroneal artery disease. Both of the wounds on the left fifth toe and left lateral malleolus have [name] present for more than a year. They have [name] to see vein and vascular. The patient has some pain but miraculously I think the wounds have largely [name] stable. No evidence of infection 05/04/16; she goes for a noninvasive study tomorrow and then sees Dr. Fletcher Anon on Monday. By the  time she is here next week we should have a better picture of whether something can be done with regards to her arterial flow. We continue to have ischemic-looking wounds on the left fifth toe and left lateral malleolus. 05/10/16; the patient went for her arterial studies and saw Dr. Fletcher Anon. As predicted she is felt to have critical limb ischemia. The feeling is that she has occlusion of the SFA. The feeling would she would be a candidate for a stent to the SFA. The patient did not make a decision to proceed with the procedure and she is here with family members to discuss this me today. He shouldn't has a lot of pain and cannot sleep and rest well at night per her family. 05/24/16; the patient went and had a complex revascularization/angioplasty of the left superficial femoral artery followed by drug-coated balloon angioplasty and spots stenting. She tolerated the procedure well. She was recommended for dual antiplatelet drugs with Plavix and aspirin for at least a month. She went yesterday for I believe follow-up serial Dopplers and ABIs although I don't see these results. The patient is unfortunately complaining of a lot of pain in her bilateral lower legs below the knees from the ankle to the knees. She apparently was prescribed lidocaine and apparently put this on her legs instead of over the wound areas. This may have something to do with it however there is a lot of edema in her bilateral legs I was able to find her arterial studies from yesterday. The left ABI has improved up to 0.66 post left SFA stent. The bilateral great toe indices remain abnormal with the left being in the 0.25 range. Duplex ultrasound showed her left SFA stent is patent monophasic waveforms persist in the left leg 06/07/16; continued punched-out areas over the dorsal left fifth toe and left lateral malleolus. No major improvement 06/28/16; the areas over her dorsal left fifth toe and left lateral  malleolus are covered in  surface slough we are using Iodoflex 07/05/16. We have [name] using Iodoflex for 2-3 weeks now. I have not [name] debridement is because of pain [name], [name] ([id number]) 07/19/16 currently we have [name] using Iodoflex for roughly the past month. Previously we were unable to debride due to pain although today patient states that the pain is not nearly as severe as it has [name] in the past. In fact she rates this to be a 1 out of 10 and it worse to a to 10 with palpation of the wound. All and all her and her daughter feel like this is actually doing steadily better at this point in time. She is pleased with progress and we have [name] seeing her every 2 weeks. 08/02/16; this is a delightful 84 year old woman I have not seen in over a month. She has arterial insufficiency wounds remaining over the left lateral malleolus and the left fifth toe. With the help of Dr. Fletcher Anon we are able to get her revascularized on the left. The 2 wounds on the left have [name] making good progress and are definitely smaller especially the area over the left lateral malleolus. She is certainly in a lot less pain than she used to be although I still think there is some claudication type pain. Unfortunately she is developed a area on the right lateral foot which is a small open area but I think is threatened. I suspect a small ischemic areas well. Also on her right fifth toe there is an area that is not open but looks as though it is receiving too much pressure from footwear. Finally an area over her right malleolus although I don't think this is on its way to anything ominous 08/17/16 patient presents today for follow up evaluation she tells me that she is really doing fairly well from a pain standpoint compared to where she has [name] previous. She is tolerating the dressing changes without any complication. She continues to have discharge and drainage however. 08-30-16 Ms. Wass presents today with her daughter, she states that  she continues to have intermittent shooting pains to the left lateral fifth toe at this site of seems to be a healed ulcer. She denies any other issues that her wound related since her last appointment. Her daughter states that she is in need of a tramadol refill at this time as she uses half a tablet at at bedtime to aid in sleep due to foot pain. Iodoflex has [name] used on all wounds in previous dressing changes. 09/13/16; the patient has ischemic wounds in her feet. The area over the left lateral malleolus is healed and the area over the left fifth toe looks improved.. She has an area on the lateral aspect of her right foot which is a small but deep wound. Finally she has a new open area on the medial aspect of the left medial malleolus. This is superficial 09/27/16; the area over the left lateral malleolus remains healed. The area over the left fifth toe has a surface and she states the pain is better but I don't think this is completely closed. She has an area on the lateral aspect of her right foot is a small but deep wound. The new open area on the medial aspect of the left ankle was closed from last time. 10/18/16; the area over her left lateral malleolus remains healed. The area over the left fifth toe has a surface over the top of this however there is no  overt open area. Given the underlying issues of severe PAD and continued pain in the toe I would think it would be unlikely this is truly healed however I am not planning to debridement this area. The area on the right lateral foot which was a more recent wound is a small punched out painful area again has significant surface slough and nonviable tissue. It is clear the patient still has claudication type pain however she remains functional. I have [name] giving her tramadol when necessary and that seems to help a lot with her pain the patient follows up with Dr. Fletcher Anon on January 18/18 11/01/16; patient missed her follow-up with Dr. Fletcher Anon last  week due to a snow day. Follow-up is now on February 15. The areas on the right lateral malleolus and dorsal right fifth toe remain closed over. The fifth toe was tentative is there is a surface eschar however I'm not going to disturb this. Therefore, her only open areas on the right lateral foot. This is a small punched out area. We have [name] using Prisma. I'm quite convinced this is an ischemic wound 11/15/16; patient has follow-up with Dr. Fletcher Anon on February 15. She has no open wound on the left leg and doesn't really complain of claudication that I can determine from talking to her. However on the right leg she clearly has some degree of claudication. The remaining open wound is on the right lateral foot. We have [name] using Iodosorb ointment 11/29/16; patient saw Dr. Fletcher Anon on 11/24/16. His comment is that her wound on her right lateral foot seems to be improving with local wound care. She has known significant right SFA disease. Her lower extremity Doppler will be repeated and according to the patient's daughter that appointment is on March 15. He is left with the thought that endovascular intervention in the right SFA might be necessary. The patient has quite a bit of pain especially at night related to the wound in her right foot. We changed her to Iodosorb ointment last week 12/13/16; this is a patient I follow every 2 weeks largely on a palliative approach at this point about ischemic wounds currently in the right foot. Initially she had them on her left lateral malleolus and left fifth toe. The area on the left lateral malleolus healed and the fifth toe has a surface eschar that I have elected not to remove. Both of these improved after revascularization by Dr. Fletcher Anon. We have [name] using Iodosorb to the right lateral foot not much change here. 12/27/16; the patient had her arterial studies. This showed known bilateral SFA disease. Stable right ABI 0.5 to stable left ABI at 0.7 to the did not do it  TBI on the right it was 0.61 on the left she has a stent in the long segment of her left SFA from 05/18/16. All of her wounds are somewhat better. She states her pain is better in the right foot is improved. 01/10/17- patient is here for follow-up dilation of her right lateral foot ulcer. She has [name] tolerating Iodosorb. She is voices no complaints or concerns 01/24/17; small ischemic wound on right lateral foot. still non viable cover. follows with Dr. Fletcher Anon on 4/30. No open area on left foot [name], [name] ([id number]) 02/07/17 doing well and states she is pain free. Saw Dr. Fletcher Anon who said she is doing well and has "50% flow in the right leg and 70% in the left. According to her daughter he does not wish to do any further  interventions 02/21/17; small ischemic wound on the right lateral foot. Per her daughter and the patient she has no open wound on the left foot and ankle which were her initial presenting wounds. still has claudication type pain at night she is [name] to see interventional cardiology who does not feel that she has a need for intervention on the right leg at present. She still has claudication type pain in the right leg which she manages at night with when necessary tramadol that I have prescribed 03/14/17; small ischemic wound on the right lateral foot. They've [name] dressing this at home with Iodosorb ointment. The patient does not complain of any pain. The wound has a surface eschar over it and there is really no visible open area. This is similar to how the areas on the left leg healed 04/11/17; the small ischemic wound on her right lateral foot is finally closed over. Small amount of eschar over the surface however I did not attempt to remove this. The patient claims to be asymptomatic she is not having any claudication that I'm able to elicit although her activity is limited Readmission: 10/23/17 on evaluation today patient appears to be doing somewhat poorly in regard to her right  foot where she has two ulcers at this point. The worst is on the lateral aspect of her foot medial first metatarsal region is not nearly as bad. With that being said these did arise seemingly out of nowhere she has previously had a similar issue in the past these have always [name] ischemic wounds due to arterial insufficiency. She does see Dr. Fletcher Anon as her vascular specialist and it has [name] noted that her ABI's are somewhat low. She actually has a repeat evaluation coming up this March 2019 to reevaluate her blood flow. On the last check 12/22/16 it was noted that patient had stable ABI's in the lower moderate abnormal range in regard to the right in regard to the left and the upper moderate at normal range. Patient is having some discomfort though nothing severe at this point in time. She is seen with her daughter and son-in-law today. No fevers, chills, nausea, or vomiting noted at this time. 10/30/17 on evaluation today patient appears to be doing well in regard to her wounds. The right lateral foot wound appears to show signs of cleaning up nicely there is granulation noted underneath that the bed of the wound although there still some [facility] covering. Nonetheless she still has a lot of discomfort and I really do not want to proceed with debridement due to the fact that she does have so much discomfort. Good news is the tramadol has [name] helping her at bedtime she only takes this once a day and that has [name] extremely beneficial. Unfortunately she does not have a primary right now as she is awaiting a new one which is the reason that I am prescribing the tramadol. Nonetheless I'm pleased with how things seem to be progressing in regard to her ulcers. 11/06/17 on evaluation today patient appears to be doing decently well as far as maintaining in regard to her ulcers. Unfortunately I did review her arterial study which shows that she has on the right arterial obstruction involving the  superficial femoral and popliteal artery as well is the superficial femoral artery with a high grade stenosis versus inclusion. Collateral flow noted in the distal portion of the SFA. Severe progression is noted compared to previous study. Unfortunately being the patient's wounds do not seem to be healing  as appropriately and quickly as we would like I do believe this is likely a result of poor vascular flow to the right lower extremity. This was discussed with patient and her daughter during the office visit today. 11/14/17 on evaluation today patient continues to have a fairly stalled ulcer in regard to her to ulcers on the right lower extremity. She obviously did have significant findings on her arterial study and I do believe this is again due to poor vascular flow. She does have an appointment later this afternoon with her vascular specialist for further evaluation to see if there any recommendations at that point. With that being said she continues to have discomfort which I do believe is arterial in nature in regard to insufficiency. 11/21/17 on evaluation today patient appears to be doing fairly well in regard to the appearance of her wounds other than the fact that she does have erythema surrounding the wound bed at this point in fact this encompasses the majority of the dorsal surface of her right foot. There is additional granulation noted today that was not noted previously on evaluation and again if that were alone were the findings that we were seeing I would be very happy with the current progress. However unfortunately she is having the erythema which concerns me for the possibility of infection although the other possibility is that she could just be having a local reaction due to the I resort. In the past she never had this issue however when she use this for the root left foot which makes me again come back to my concern about infection. 11/28/17 on evaluation today patient is status  post having had an angiogram performed on 11/22/17 where it was noted that she had severe disease involving the TP trunk and proximal peroneal artery. Subsequently she underwent successful atherectomy and drug coated balloon angioplasty to the right SFA, TP trunk, and proximal peroneal artery. Patient stated only of has 10% residual stenosis and overall the procedure at the time was both tolerated well as well as considered a success. This was performed by Dr. Sophronia Simas. Patient was subsequently discharged home on 11/23/17 from the hospital. Overall she states that her leg pain is not nearly as severe as what it was previous which is excellent news. She also has [name] sleeping with her TERESE, HEIER ([id number]) leg in the bed previously she was hanging this over the edge due to the pain. She also seems to have much better blood flow on evaluation today. 12/05/17 on evaluation today patient appears to be doing very well in regard to her quite lateral great toe as well as the right lateral foot ulcer. She has excellent blood flow and great capillary refill noted on evaluation today which is excellent news. With that being said she does have discomfort but mainly at the site of the wounds especially lateral foot wound but nothing like what she was experiencing previously. This is rated to be a 3/10 at most. She is seen with her daughter present. 12/12/17 on evaluation today patient appears to be doing very well in regard to her right foot and right medial great toe ulcers. She has [name] tolerating the dressing changes without complication. She doesn't seem to have any significant discomfort which is also good news. Overall she has [name] doing excellent since her vascular intervention in my opinion. She actually does have follow-up later today with vascular for a repeat arterial study to ensure everything seems to be doing well following her surgery. 12/19/17  on evaluation today patient appears to be doing  excellent in regard to her lower extremity ulcers. In fact the right medial great toe ulcer has completely resolved at this point which is excellent news. The right lateral foot ulcer seems to be showing signs of improving week by week and obviously this is good news as well. Overall I'm very pleased with the progress that she has made up to this point. She does have some Slough covering the lateral foot wound which does require sharp debridement today. I did review her vascular note from Dr. Dellis Filbert and it appears that everything seems to be doing well from a vascular standpoint and the patient's foot continues to be warm and appears well vascularized. 01/02/18 on evaluation today patient appears to be doing well in regard to her right lateral foot ulcer. She has [name] tolerating the dressing changes without complication. There does not appear to be any evidence of infection at this time which is also good news. She has definite new granulation noted. 01/16/18 on evaluation today patient appears to be doing very well. She has [name] tolerating the dressing changes without complication she has good epithelialization noted at this point. There is no evidence of infection. Overall I'm extremely happy with the progress she seems to be making. Electronic Signature(s) Signed: 01/16/2018 8:41:23 PM By: Worthy Keeler PA-C Entered By: Worthy Keeler on 01/16/2018 20:02:29 [name] ([id number]) -------------------------------------------------------------------------------- Physical Exam Details Patient Name: [name] Date of Service: 01/16/2018 10:00 AM Medical Record Number: [id number] Patient Account Number: [id number] Date of Birth/Sex: April 01, 1919 (84 y.o. F) Treating RN: Ahmed Prima Primary Care Provider: PATIENT, NO Other Clinician: Referring Provider: Referral, Self Treating Provider/Extender: STONE III, HOYT Weeks in Treatment: 81 Constitutional Well-nourished and well-hydrated in  no acute distress. Respiratory normal breathing without difficulty. Psychiatric this patient is able to make decisions and demonstrates good insight into disease process. Alert and Oriented x 3. pleasant and cooperative. Notes Patient's wound today only required minimal debridement in order to clean away the slough on the surface of the wound the majority of the wound appear to be opening upon further inspection turned out to be indeed closed. There did not appear to be any evidence of significant cellulitis or purulent discharge which is great news. Post debridement she had an excellent granulation bed. Electronic Signature(s) Signed: 01/16/2018 8:41:23 PM By: Worthy Keeler PA-C Entered By: Worthy Keeler on 01/16/2018 20:03:17 [name] ([id number]) -------------------------------------------------------------------------------- Physician Orders Details Patient Name: [name] Date of Service: 01/16/2018 10:00 AM Medical Record Number: [id number] Patient Account Number: [id number] Date of Birth/Sex: 1919-03-07 (84 y.o. 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 o Cleanse wound with mild soap and water o May Shower, gently pat wound dry prior to applying new dressing. Anesthetic (add to Medication List) Wound #7 Right,Dorsal Foot o Topical Lidocaine 4% cream applied to wound  bed prior to debridement (In Clinic Only). Skin Barriers/Peri-Wound Care Wound #7 Right,Dorsal Foot o Skin Prep Primary Wound Dressing Wound #  7 Right,Dorsal Foot o Silver Collagen - prisma ag Secondary Dressing Wound #7 Right,Dorsal Foot o Dry Gauze o Tegaderm Dressing Change Frequency Wound #7 Right,Dorsal Foot o Change dressing every day. Follow-up Appointments Wound #7 Right,Dorsal Foot o Return Appointment in 2 weeks. Edema Control Wound #7 Right,Dorsal Foot Riccobono, Niurka ([id number]) o Elevate legs to the level of the heart and pump ankles as often as possible Off-Loading Wound #7 Right,Dorsal Foot o Turn and reposition every 2 hours Additional Orders / Instructions Wound #7 Right,Dorsal Foot o Increase protein intake. o Other: - Vitamin C, Zinc Electronic Signature(s) Signed: 01/16/2018 4:09:12 PM By: Roger Shelter Signed: 01/16/2018 8:41:23 PM By: Worthy Keeler PA-C Entered By: Roger Shelter on 01/16/2018 10:42:53 [name] ([id number]) -------------------------------------------------------------------------------- Problem List Details Patient Name: [name] Date of Service: 01/16/2018 10:00 AM Medical Record Number: [id number] Patient Account Number: [id number] Date of Birth/Sex: [birth year] (84 y.o. 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 F) Treating RN: Ahmed Prima Primary Care Provider: PATIENT, NO Other Clinician: Referring Provider: Referral, Self Treating Provider/Extender: STONE III, HOYT Weeks in Treatment: 12 Subjective Chief Complaint Information obtained from Patient Right foot Ulcers History of Present Illness (HPI) 04/06/16; this is a 84 year old woman who arrives accompanied by 2 daughters for a wound on her left ankle and her left fifth toe. These have apparently [name] present for a year. I'm not quite certain how she came to this clinic however she was being followed by Sharlotte Alamo her podiatrist for these wounds. She was also referred to [facility] and they apparently did a test presumably arterial studies although we don't have any of these results and we couldn't get through to the office today. The family but has [name] applying a combination of Bactroban and a light bandage and perhaps more recently Silvadene cream. She did have an x-ray of the foot roughly 6 months ago at the podiatry office the family was unaware that if there were any abnormalities. Apparently they have not seen any healing here. Our intake nurse noted a slight skin tear on the right anterior lower leg. Nobody seemed aware of this. ABIs calculated in this clinic was 0.3 on the right and 0.4 on the left I have reviewed things in [facility]. There is very little information on this patient. She apparently follows in current total clinic which we  don't have information from. She has mentioned already [name] to a [facility]. She has a history of hypothyroidism, nephrolithiasis arthritis and has had a previous mastectomy. 04/13/16; patient's x-ray was normal. She is already [name] to see Dr. Delana Meyer vascular surgery. Her arterial exam was from November 2016 this showed a left ABI of 0.62 her right  of 0.76. Her duplex ultrasound of the left leg showed biphasic waves in the common femoral and distal femoral artery however monophasic waves in the superficial femoral artery proximal and mid biphasic it distal. 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 The patient tells me that she has pain at night when she tries to lie down which is improved by getting up and sitting in the chair this sounds like claudication at rest. Her wounds are on the left medial malleolus and the dorsal fifth toe small punched out wounds that are right on bone. We use Santyl last week 04/20/16: nurse informed me pt has declined evaluation for significant PAD. she denies systemic s/s of infections. 04/27/16;; the patient has had noninvasive arterial studies done in November 2016. ABI and the left was 0.62. Monophasic waves at the superficial femoral artery. Occluded to the posterior tibial artery. So had greater than 50% stenosis of the right superficial femoral and greater than 50% stenosis of the left superficial femoral artery. She had bilateral tibial peroneal artery disease. Both of the wounds on the left fifth toe and left lateral malleolus have [name] present for more than a year. They have [name] to see vein and vascular. The patient has some pain but miraculously I think the wounds have largely [name] stable. No evidence of infection 05/04/16; she goes for a noninvasive study tomorrow and then sees Dr. Fletcher Anon on Monday. By the time she is here next week we should have a better picture of whether something can be done with regards to her arterial flow. We continue to have ischemic-looking  wounds on the left fifth toe and left lateral malleolus. 05/10/16; the patient went for her arterial studies and saw Dr. Fletcher Anon. As predicted she is felt to have critical limb ischemia. The feeling is that she has occlusion of the SFA. The feeling would she would be a candidate for a stent to the SFA. The patient did not make a decision to proceed with the procedure and she is here with family members to discuss this me today. He shouldn't has a lot of pain and cannot sleep and rest well at night per her family. 05/24/16; the patient went and had a complex revascularization/angioplasty of the left superficial femoral artery followed by drug-coated balloon angioplasty and spots stenting. She tolerated the procedure well. She was recommended for dual antiplatelet drugs with Plavix and aspirin for at least a month. She went yesterday for I believe follow-up serial Dopplers and ABIs although I don't see these results. The patient is unfortunately complaining of a lot of pain in her bilateral lower legs below the knees from the ankle to the knees. She apparently was prescribed lidocaine and apparently put this on her legs instead of over the wound areas. This may have something to do with it however there is a lot of edema in her bilateral legs I was able to find her arterial studies from yesterday. The left ABI has improved up to 0.66 post left SFA stent. The bilateral [name], [name] ([id number]) great toe indices remain abnormal with the left being in the 0.25 range. Duplex ultrasound showed her left SFA stent is patent monophasic waveforms persist in the left leg 06/07/16; continued punched-out areas over the dorsal left fifth toe and left lateral malleolus. No major improvement 06/28/16; the areas over her dorsal left fifth toe and left lateral malleolus are covered in surface slough we are using Iodoflex 07/05/16. We have [name] using Iodoflex for 2-3 weeks now. I have not  [name] debridement is because of  pain 07/19/16 currently we have [name] using Iodoflex for roughly the past month. Previously we were unable to debride due to pain although today patient states that the pain is not nearly as severe as it has [name] in the past. In fact she rates this to be a 1 out of 10 and it worse to a to 10 with palpation of the wound. All and all her and her daughter feel like this is actually doing steadily better at this point in time. She is pleased with progress and we have [name] seeing her every 2 weeks. 08/02/16; this is a delightful 84 year old woman I have not seen in over a month. She has arterial insufficiency wounds remaining over the left lateral malleolus and the left fifth toe. With the help of Dr. Fletcher Anon we are able to get her revascularized on the left. The 2 wounds on the left have [name] making good progress and are definitely smaller especially the area over the left lateral malleolus. She is certainly in a lot less pain than she used to be although I still think there is some claudication type pain. Unfortunately she is developed a area on the right lateral foot which is a small open area but I think is threatened. I suspect a small ischemic areas well. Also on her right fifth toe there is an area that is not open but looks as though it is receiving too much pressure from footwear. Finally an area over her right malleolus although I don't think this is on its way to anything ominous 08/17/16 patient presents today for follow up evaluation she tells me that she is really doing fairly well from a pain standpoint compared to where she has [name] previous. She is tolerating the dressing changes without any complication. She continues to have discharge and drainage however. 08-30-16 Ms. Menger presents today with her daughter, she states that she continues to have intermittent shooting pains to the left lateral fifth toe at this site of seems to be a healed ulcer. She denies any other issues that her  wound related since her last appointment. Her daughter states that she is in need of a tramadol refill at this time as she uses half a tablet at at bedtime to aid in sleep due to foot pain. Iodoflex has [name] used on all wounds in previous dressing changes. 09/13/16; the patient has ischemic wounds in her feet. The area over the left lateral malleolus is healed and the area over the left fifth toe looks improved.. She has an area on the lateral aspect of her right foot which is a small but deep wound. Finally she has a new open area on the medial aspect of the left medial malleolus. This is superficial 09/27/16; the area over the left lateral malleolus remains healed. The area over the left fifth toe has a surface and she states the pain is better but I don't think this is completely closed. She has an area on the lateral aspect of her right foot is a small but deep wound. The new open area on the medial aspect of the left ankle was closed from last time. 10/18/16; the area over her left lateral malleolus remains healed. The area over the left fifth toe has a surface over the top of this however there is no overt open area. Given the underlying issues of severe PAD and continued pain in the toe I would think it would be unlikely this is truly  healed however I am not planning to debridement this area. The area on the right lateral foot which was a more recent wound is a small punched out painful area again has significant surface slough and nonviable tissue. It is clear the patient still has claudication type pain however she remains functional. I have [name] giving her tramadol when necessary and that seems to help a lot with her pain the patient follows up with Dr. Fletcher Anon on January 18/18 11/01/16; patient missed her follow-up with Dr. Fletcher Anon last week due to a snow day. Follow-up is now on February 15. The areas on the right lateral malleolus and dorsal right fifth toe remain closed over. The fifth toe was  tentative is there is a surface eschar however I'm not going to disturb this. Therefore, her only open areas on the right lateral foot. This is a small punched out area. We have [name] using Prisma. I'm quite convinced this is an ischemic wound 11/15/16; patient has follow-up with Dr. Fletcher Anon on February 15. She has no open wound on the left leg and doesn't really complain of claudication that I can determine from talking to her. However on the right leg she clearly has some degree of claudication. The remaining open wound is on the right lateral foot. We have [name] using Iodosorb ointment 11/29/16; patient saw Dr. Fletcher Anon on 11/24/16. His comment is that her wound on her right lateral foot seems to be improving with local wound care. She has known significant right SFA disease. Her lower extremity Doppler will be repeated and according to the patient's daughter that appointment is on March 15. He is left with the thought that endovascular intervention in the right SFA might be necessary. The patient has quite a bit of pain especially at night related to the wound in her right foot. We changed her to Iodosorb ointment last week 12/13/16; this is a patient I follow every 2 weeks largely on a palliative approach at this point about ischemic wounds currently in the right foot. Initially she had them on her left lateral malleolus and left fifth toe. The area on the left lateral malleolus healed and the fifth toe has a surface eschar that I have elected not to remove. Both of these improved after revascularization by Dr. Fletcher Anon. We have [name] using Iodosorb to the right lateral foot not much change here. 12/27/16; the patient had her arterial studies. This showed known bilateral SFA disease. Stable right ABI 0.5 to stable left ABI at 0.7 to the did not do it TBI on the right it was 0.61 on the left she has a stent in the long segment of her left SFA from [place], [name] ([id number]) 05/18/16. All of her wounds are  somewhat better. She states her pain is better in the right foot is improved. 01/10/17- patient is here for follow-up dilation of her right lateral foot ulcer. She has [name] tolerating Iodosorb. She is voices no complaints or concerns 01/24/17; small ischemic wound on right lateral foot. still non viable cover. follows with Dr. Fletcher Anon on 4/30. No open area on left foot 02/07/17 doing well and states she is pain free. Saw Dr. Fletcher Anon who said she is doing well and has "50% flow in the right leg and 70% in the left. According to her daughter he does not wish to do any further interventions 02/21/17; small ischemic wound on the right lateral foot. Per her daughter and the patient she has no open wound on the left foot and  ankle which were her initial presenting wounds. still has claudication type pain at night she is [name] to see interventional cardiology who does not feel that she has a need for intervention on the right leg at present. She still has claudication type pain in the right leg which she manages at night with when necessary tramadol that I have prescribed 03/14/17; small ischemic wound on the right lateral foot. They've [name] dressing this at home with Iodosorb ointment. The patient does not complain of any pain. The wound has a surface eschar over it and there is really no visible open area. This is similar to how the areas on the left leg healed 04/11/17; the small ischemic wound on her right lateral foot is finally closed over. Small amount of eschar over the surface however I did not attempt to remove this. The patient claims to be asymptomatic she is not having any claudication that I'm able to elicit although her activity is limited Readmission: 10/23/17 on evaluation today patient appears to be doing somewhat poorly in regard to her right foot where she has two ulcers at this point. The worst is on the lateral aspect of her foot medial first metatarsal region is not nearly as bad. With that  being said these did arise seemingly out of nowhere she has previously had a similar issue in the past these have always [name] ischemic wounds due to arterial insufficiency. She does see Dr. Fletcher Anon as her vascular specialist and it has [name] noted that her ABI's are somewhat low. She actually has a repeat evaluation coming up this March 2019 to reevaluate her blood flow. On the last check 12/22/16 it was noted that patient had stable ABI's in the lower moderate abnormal range in regard to the right in regard to the left and the upper moderate at normal range. Patient is having some discomfort though nothing severe at this point in time. She is seen with her daughter and son-in-law today. No fevers, chills, nausea, or vomiting noted at this time. 10/30/17 on evaluation today patient appears to be doing well in regard to her wounds. The right lateral foot wound appears to show signs of cleaning up nicely there is granulation noted underneath that the bed of the wound although there still some [facility] covering. Nonetheless she still has a lot of discomfort and I really do not want to proceed with debridement due to the fact that she does have so much discomfort. Good news is the tramadol has [name] helping her at bedtime she only takes this once a day and that has [name] extremely beneficial. Unfortunately she does not have a primary right now as she is awaiting a new one which is the reason that I am prescribing the tramadol. Nonetheless I'm pleased with how things seem to be progressing in regard to her ulcers. 11/06/17 on evaluation today patient appears to be doing decently well as far as maintaining in regard to her ulcers. Unfortunately I did review her arterial study which shows that she has on the right arterial obstruction involving the superficial femoral and popliteal artery as well is the superficial femoral artery with a high grade stenosis versus inclusion. Collateral flow noted in the distal  portion of the SFA. Severe progression is noted compared to previous study. Unfortunately being the patient's wounds do not seem to be healing as appropriately and quickly as we would like I do believe this is likely a result of poor vascular flow to the right lower extremity. This  was discussed with patient and her daughter during the office visit today. 11/14/17 on evaluation today patient continues to have a fairly stalled ulcer in regard to her to ulcers on the right lower extremity. She obviously did have significant findings on her arterial study and I do believe this is again due to poor vascular flow. She does have an appointment later this afternoon with her vascular specialist for further evaluation to see if there any recommendations at that point. With that being said she continues to have discomfort which I do believe is arterial in nature in regard to insufficiency. 11/21/17 on evaluation today patient appears to be doing fairly well in regard to the appearance of her wounds other than the fact that she does have erythema surrounding the wound bed at this point in fact this encompasses the majority of the dorsal surface of her right foot. There is additional granulation noted today that was not noted previously on evaluation and again if that were alone were the findings that we were seeing I would be very happy with the current progress. However unfortunately she is having the erythema which concerns me for the possibility of infection although the other possibility is that she could just be having a local reaction due to the I resort. In the past she never had this issue however when she use this for the root left foot which makes me again come back to my concern about infection. [name], [name] ([id number]) 11/28/17 on evaluation today patient is status post having had an angiogram performed on 11/22/17 where it was noted that she had severe disease involving the TP trunk and proximal  peroneal artery. Subsequently she underwent successful atherectomy and drug coated balloon angioplasty to the right SFA, TP trunk, and proximal peroneal artery. Patient stated only of has 10% residual stenosis and overall the procedure at the time was both tolerated well as well as considered a success. This was performed by Dr. Sophronia Simas. Patient was subsequently discharged home on 11/23/17 from the hospital. Overall she states that her leg pain is not nearly as severe as what it was previous which is excellent news. She also has [name] sleeping with her leg in the bed previously she was hanging this over the edge due to the pain. She also seems to have much better blood flow on evaluation today. 12/05/17 on evaluation today patient appears to be doing very well in regard to her quite lateral great toe as well as the right lateral foot ulcer. She has excellent blood flow and great capillary refill noted on evaluation today which is excellent news. With that being said she does have discomfort but mainly at the site of the wounds especially lateral foot wound but nothing like what she was experiencing previously. This is rated to be a 3/10 at most. She is seen with her daughter present. 12/12/17 on evaluation today patient appears to be doing very well in regard to her right foot and right medial great toe ulcers. She has [name] tolerating the dressing changes without complication. She doesn't seem to have any significant discomfort which is also good news. Overall she has [name] doing excellent since her vascular intervention in my opinion. She actually does have follow-up later today with vascular for a repeat arterial study to ensure everything seems to be doing well following her surgery. 12/19/17 on evaluation today patient appears to be doing excellent in regard to her lower extremity ulcers. In fact the right medial great toe ulcer has completely  resolved at this point which is excellent news. The right  lateral foot ulcer seems to be showing signs of improving week by week and obviously this is good news as well. Overall I'm very pleased with the progress that she has made up to this point. She does have some Slough covering the lateral foot wound which does require sharp debridement today. I did review her vascular note from Dr. Dellis Filbert and it appears that everything seems to be doing well from a vascular standpoint and the patient's foot continues to be warm and appears well vascularized. 01/02/18 on evaluation today patient appears to be doing well in regard to her right lateral foot ulcer. She has [name] tolerating the dressing changes without complication. There does not appear to be any evidence of infection at this time which is also good news. She has definite new granulation noted. 01/16/18 on evaluation today patient appears to be doing very well. She has [name] tolerating the dressing changes without complication she has good epithelialization noted at this point. There is no evidence of infection. Overall I'm extremely happy with the progress she seems to be making. Patient History Information obtained from Patient. Family History Diabetes - Mother, Heart Disease - Siblings, Stroke - Siblings, No family history of Cancer, Hereditary Spherocytosis, Hypertension, Kidney Disease, Lung Disease, Seizures, Thyroid Problems, Tuberculosis. Social History Never smoker, Marital Status - Widowed, Alcohol Use - Never, Drug Use - No History, Caffeine Use - Daily. Review of Systems (ROS) Constitutional Symptoms (General Health) Denies complaints or symptoms of Fever, Chills. Respiratory The patient has no complaints or symptoms. Cardiovascular The patient has no complaints or symptoms. Psychiatric The patient has no complaints or symptoms. [name], [name] ([id number]) Objective Constitutional Well-nourished and well-hydrated in no acute distress. Vitals Time Taken: 10:11 AM, Height: 64 in,  Weight: 156 lbs, BMI: 26.8, Temperature: 97.5 F, Pulse: 72 bpm, Respiratory Rate: 16 breaths/min, Blood Pressure: 137/62 mmHg. Respiratory normal breathing without difficulty. Psychiatric this patient is able to make decisions and demonstrates good insight into disease process. Alert and Oriented x 3. pleasant and cooperative. General Notes: Patient's wound today only required minimal debridement in order to clean away the slough on the surface of the wound the majority of the wound appear to be opening upon further inspection turned out to be indeed closed. There did not appear to be any evidence of significant cellulitis or purulent discharge which is great news. Post debridement she had an excellent granulation bed. Integumentary (Hair, Skin) Wound #7 status is Open. Original cause of wound was Gradually Appeared. The wound is located on the Right,Dorsal Foot. The wound measures 0.5cm length x 0.7cm width x 0.2cm depth; 0.275cm^2 area and 0.055cm^3 volume. There is Fat Layer (Subcutaneous Tissue) Exposed exposed. There is no tunneling or undermining noted. There is a large amount of serous drainage noted. The wound margin is distinct with the outline attached to the wound base. There is medium (34-66%) pink granulation within the wound bed. There is a medium (34-66%) amount of necrotic tissue within the wound bed including Adherent Slough. The periwound skin appearance did not exhibit: Callus, Crepitus, Excoriation, Induration, Rash, Scarring, Dry/Scaly, Maceration, Atrophie Blanche, Cyanosis, Ecchymosis, Hemosiderin Staining, Mottled, Pallor, Rubor, Erythema. Periwound temperature was noted as No Abnormality. The periwound has tenderness on palpation. 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 There was a Excisional Skin/Subcutaneous Tissue Debridement with a total area of 0.35 sq cm performed by STONE III, HOYT E., PA-C. With the following instrument(s): Curette. to remove Viable and Non- Viable tissue/material Material removed includes Subcutaneous Tissue and Biofilm and after achieving pain control using Lidocaine 4% Topical Solution. No specimens were taken. A time out was conducted at 10:27, prior to the start of the procedure. A Minimum amount of bleeding was controlled with Pressure. The procedure was tolerated well with a pain level of 0 throughout and a pain level of 0 following the procedure. Post Debridement Measurements: 0.5cm length x 0.7cm width x 0.3cm depth; 0.082cm^3 volume. Character of Wound/Ulcer Post Debridement requires further debridement. Severity of Tissue Post Debridement is: Fat layer exposed. Post procedure Diagnosis Wound #7: Same as Pre-Procedure Plan Wound Cleansing: Wound #7 Right,Dorsal Foot: Clean wound with Normal Saline. Cleanse wound with mild soap and water May Shower, gently pat wound dry prior to applying new dressing. Anesthetic (add to Medication List): Wound #7 Right,Dorsal Foot: Topical Lidocaine 4% cream applied to wound bed prior to debridement (In Clinic Only). Skin Barriers/Peri-Wound Care: Wound #7 Right,Dorsal Foot: Skin Prep Primary Wound Dressing: Wound #7 Right,Dorsal Foot: Silver Collagen - prisma ag Secondary Dressing: Wound #7 Right,Dorsal Foot: Dry Gauze Tegaderm Dressing Change Frequency: Wound #7 Right,Dorsal Foot: Change dressing every day. Follow-up Appointments: Wound #7 Right,Dorsal  Foot: Return Appointment in 2 weeks. Edema Control: Wound #7 Right,Dorsal Foot: Elevate legs to the level of the heart and pump ankles as often as possible Off-Loading: Wound #7 Right,Dorsal Foot: Turn and reposition every 2 hours Additional Orders / Instructions: Wound #7 Right,Dorsal Foot: Increase protein intake. Other: - Vitamin C, Zinc [name], [name] ([id number]) At this time I'm going to suggest that we continue with the Current wound care measures since she seems to be making such good progress. Patient is in agreement with this plan. Her daughter is also in agreement. Hopefully she will continue to make good progress week by week. Please see above for specific wound care orders. We will see patient for re-evaluation in 2 week(s) here in the clinic. If anything worsens or changes patient will contact our office for additional recommendations. Electronic Signature(s) Signed: 01/16/2018 8:41:23 PM By: Worthy Keeler PA-C Entered By: Worthy Keeler on 01/16/2018 20:04:33 [name] ([id number]) -------------------------------------------------------------------------------- ROS/PFSH Details Patient Name: [name] Date of Service: 01/16/2018 10:00 AM Medical Record Number: [id number] Patient Account Number: [id number] Date of Birth/Sex: [birth year] (84 y.o. F) Treating RN: Ahmed Prima Primary Care Provider: PATIENT, NO Other Clinician: Referring Provider: Referral, Self Treating Provider/Extender: STONE III, HOYT Weeks in Treatment: 12 Information Obtained From Patient Wound History Do you currently have one or more open woundso Yes How many open wounds do you currently haveo 1 Approximately how long have you had your woundso 1.5 weeks How have you [name] treating your wound(s) until nowo bandaide Has your wound(s) ever healed and then re-openedo No Have you had any lab work done in the past montho No Have you tested positive for an antibiotic resistant  organism (MRSA, VRE)o No Have you tested positive for osteomyelitis (bone infection)o No Have you had any tests for circulation on your legso Yes Where was the  test doneo [facility] Have you had other problems associated with your woundso Swelling Constitutional Symptoms (General Health) Complaints and Symptoms: Negative for: Fever; Chills Eyes Medical History: Positive for: Cataracts - had surgery Respiratory Complaints and Symptoms: No Complaints or Symptoms Cardiovascular Complaints and Symptoms: No Complaints or Symptoms Medical History: Positive for: Hypertension Musculoskeletal Medical History: Positive for: Osteoarthritis Oncologic Medical History: Negative for: Received Chemotherapy; Received Radiation Psychiatric DRISHTI, PEPPERMAN ([id number]) Complaints and Symptoms: No Complaints or Symptoms HBO Extended History Items Eyes: Cataracts Immunizations Pneumococcal Vaccine: Received Pneumococcal Vaccination: Yes Implantable Devices Family and Social History Cancer: No; Diabetes: Yes - Mother; Heart Disease: Yes - Siblings; Hereditary Spherocytosis: No; Hypertension: No; Kidney Disease: No; Lung Disease: No; Seizures: No; Stroke: Yes - Siblings; Thyroid Problems: No; Tuberculosis: No; Never smoker; Marital Status - Widowed; Alcohol Use: Never; Drug Use: No History; Caffeine Use: Daily; Financial Concerns: No; Food, Clothing or Shelter Needs: No; Support System Lacking: No; Transportation Concerns: No; Advanced Directives: No; Patient does not want information on Advanced Directives; Do not resuscitate: No; Living Will: No; Medical Power of Attorney: No Physician Affirmation I have reviewed and agree with the above information. Electronic Signature(s) Signed: 01/16/2018 8:41:23 PM By: Worthy Keeler PA-C Signed: 01/22/2018 4:22:27 PM By: Alric Quan Entered By: Worthy Keeler on 01/16/2018 20:02:59 [name]  ([id number]) -------------------------------------------------------------------------------- SuperBill Details Patient Name: [name] Date of Service: 01/16/2018 Medical Record Number: [id number] Patient Account Number: [id number] Date of Birth/Sex: 1919-04-01 (84 y.o. 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 I10 Essential (primary) hypertension Facility Procedures CPT4 Code: [id number] Description: 28366 - DEB SUBQ TISSUE 20 SQ CM/< ICD-10 Diagnosis Description L97.512 Non-pressure chronic ulcer of other part of right foot with fat l Modifier: ayer exposed Quantity: 1 Physician Procedures CPT4 Code: [id number] Description: 65035 - WC PHYS SUBQ TISS 20 SQ CM ICD-10 Diagnosis Description L97.512 Non-pressure chronic ulcer of other part of right foot with fat l Modifier: ayer exposed Quantity: 1 Electronic Signature(s) Signed: 01/16/2018 8:41:23 PM By: Worthy Keeler PA-C Entered By: Worthy Keeler on 01/16/2018 20:04:46

## 2018-01-30 ENCOUNTER — Encounter: Payer: Medicare PPO | Admitting: Physician Assistant

## 2018-01-30 DIAGNOSIS — I70232 Atherosclerosis of native arteries of right leg with ulceration of calf: Secondary | ICD-10-CM | POA: Diagnosis not present

## 2018-02-06 NOTE — Progress Notes (Signed)
[name], [name] ([id number]) Visit Report for 01/30/2018 Arrival Information Details Patient Name: [name], [name] Date of Service: 01/30/2018 9:15 AM Medical Record Number: [id number] Patient Account Number: [id number] Date of Birth/Sex: 08/10/1919 (84 y.o. F) Treating RN: Roger Shelter Primary Care Samaiyah Howes: PATIENT, NO Other Clinician: Referring Carolyna Yerian: Referral, Self Treating Johnston Maddocks/Extender: STONE III, HOYT Weeks in Treatment: 14 Visit Information History Since Last Visit All ordered tests and consults were completed: No Patient Arrived: Walker Added or deleted any medications: No Arrival Time: 09:29 Any new allergies or adverse reactions: No Accompanied By: daughter Had a fall or experienced change in No Transfer Assistance: None activities of daily living that may affect Patient Identification Verified: Yes risk of falls: Secondary Verification Process Completed: Yes Signs or symptoms of abuse/neglect since last visito No Patient Requires Transmission-Based Precautions: No Hospitalized since last visit: No Patient Has Alerts: No Implantable device outside of the clinic excluding No cellular tissue based products placed in the center since last visit: Pain Present Now: No Electronic Signature(s) Signed: 01/30/2018 4:51:25 PM By: Roger Shelter Entered By: Roger Shelter on 01/30/2018 09:32:54 [name] ([id number]) -------------------------------------------------------------------------------- Encounter Discharge Information Details Patient Name: [name] Date of Service: 01/30/2018 9:15 AM Medical Record Number: [id number] Patient Account Number: [id number] Date of Birth/Sex: Jun 11, 1919 (84 y.o. F) Treating RN: Cornell Barman Primary Care Deziree Mokry: PATIENT, NO Other Clinician: Referring Bunny Kleist: Referral, Self Treating Anjel Perfetti/Extender: STONE III, HOYT Weeks in Treatment: 14 Encounter Discharge Information Items Discharge Pain Level:  1 Discharge Condition: Stable Ambulatory Status: Walker Discharge Destination: Home Transportation: Private Auto Accompanied By: daughter Schedule Follow-up Appointment: Yes Medication Reconciliation completed and No provided to Patient/Care Shota Kohrs: Provided on Clinical Summary of Care: 01/30/2018 Form Type Recipient Paper Patient [facility] Electronic Signature(s) Signed: 01/31/2018 3:43:47 PM By: Ruthine Dose Entered By: Ruthine Dose on 01/30/2018 10:17:32 [name] ([id number]) -------------------------------------------------------------------------------- Lower Extremity Assessment Details Patient Name: [name] Date of Service: 01/30/2018 9:15 AM Medical Record Number: [id number] Patient Account Number: [id number] Date of Birth/Sex: May 27, 1919 (84 y.o. F) Treating RN: Roger Shelter Primary Care Oniyah Rohe: PATIENT, NO Other Clinician: Referring Jamelah Sitzer: Referral, Self Treating Oneka Parada/Extender: STONE III, HOYT Weeks in Treatment: 14 Edema Assessment Assessed: [Left: No] [Right: No] Edema: [Left: Ye] [Right: s] Vascular Assessment Claudication: Claudication Assessment [Right:None] Pulses: Dorsalis Pedis Palpable: [Right:Yes] Posterior Tibial Extremity colors, hair growth, and conditions: Extremity Color: [Right:Normal] Hair Growth on Extremity: [Right:Yes] Temperature of Extremity: [Right:Warm] Capillary Refill: [Right:< 3 seconds] Toe Nail Assessment Left: Right: Thick: No Discolored: No Deformed: No Improper Length and Hygiene: No Electronic Signature(s) Signed: 01/30/2018 4:51:25 PM By: Roger Shelter Entered By: Roger Shelter on 01/30/2018 09:40:08 [name] ([id number]) -------------------------------------------------------------------------------- Multi Wound Chart Details Patient Name: [name] Date of Service: 01/30/2018 9:15 AM Medical Record Number: [id number] Patient Account Number: [id number] Date of Birth/Sex:  Mar 16, 1919 (84 y.o. F) Treating RN: Ahmed Prima Primary Care Maeson Purohit: PATIENT, NO Other Clinician: Referring Faysal Fenoglio: Referral, Self Treating Joao Mccurdy/Extender: STONE III, HOYT Weeks in Treatment: 14 Vital Signs Height(in): 64 Pulse(bpm): 76 Weight(lbs): 156 Blood Pressure(mmHg): 160/58 Body Mass Index(BMI): 27 Temperature(F): 97.6 Respiratory Rate 16 (breaths/min): Photos: [7:No Photos] [N/A:N/A] Wound Location: [7:Right, Dorsal Foot] [N/A:N/A] Wounding Event: [7:Gradually Appeared] [N/A:N/A] Primary Etiology: [7:Arterial Insufficiency Ulcer] [N/A:N/A] Date Acquired: [7:10/11/2017] [N/A:N/A] Weeks of Treatment: [7:14] [N/A:N/A] Wound Status: [7:Open] [N/A:N/A] Measurements L x W x D [7:0.5x0.5x0.2] [N/A:N/A] (cm) Area (cm) : [7:0.196] [N/A:N/A] Volume (cm) : [7:0.039] [N/A:N/A] % Reduction in Area: [7:65.30%] [N/A:N/A] % Reduction in Volume: [7:65.50%] [N/A:N/A] Classification: [7:Partial Thickness] [N/A:N/A] Periwound Skin Texture: [7:No Abnormalities  Noted] [N/A:N/A] Periwound Skin Moisture: [7:No Abnormalities Noted] [N/A:N/A] Periwound Skin Color: [7:No Abnormalities Noted No] [N/A:N/A N/A] Treatment Notes Electronic Signature(s) Signed: 02/02/2018 4:29:16 PM By: Alric Quan Entered By: Alric Quan on 01/30/2018 10:02:49 [name] ([id number]) -------------------------------------------------------------------------------- [place] Details Patient Name: [name] Date of Service: 01/30/2018 9:15 AM Medical Record Number: [id number] Patient Account Number: [id number] Date of Birth/Sex: 12-08-1918 (84 y.o. F) Treating RN: Ahmed Prima Primary Care Marveline Profeta: PATIENT, NO Other Clinician: Referring Kaylea Mounsey: Referral, Self Treating Lamoine Magallon/Extender: STONE III, HOYT Weeks in Treatment: 14 Active Inactive ` Abuse / Safety / Falls / Self Care Management Nursing Diagnoses: History of Falls Potential for  falls Goals: Patient will not experience any injury related to falls Date Initiated: 10/23/2017 Target Resolution Date: 02/17/2018 Goal Status: Active Interventions: Assess Activities of Daily Living upon admission and as needed Assess fall risk on admission and as needed Assess: immobility, friction, shearing, incontinence upon admission and as needed Assess impairment of mobility on admission and as needed per policy Assess personal safety and home safety (as indicated) on admission and as needed Assess self care needs on admission and as needed Notes: ` Nutrition Nursing Diagnoses: Imbalanced nutrition Potential for alteratiion in Nutrition/Potential for imbalanced nutrition Goals: Patient/caregiver agrees to and verbalizes understanding of need to use nutritional supplements and/or vitamins as prescribed Date Initiated: 10/23/2017 Target Resolution Date: 02/17/2018 Goal Status: Active Interventions: Assess patient nutrition upon admission and as needed per policy Notes: ` Orientation to the Wound Care Program Nursing Diagnoses: JASANI, LENGEL ([id number]) Knowledge deficit related to the wound healing center program Goals: Patient/caregiver will verbalize understanding of the [place] Program Date Initiated: 10/23/2017 Target Resolution Date: 11/18/2017 Goal Status: Active Interventions: Provide education on orientation to the wound center Notes: ` Pain, Acute or Chronic Nursing Diagnoses: Pain, acute or chronic: actual or potential Potential alteration in comfort, pain Goals: Patient/caregiver will verbalize adequate pain control between visits Date Initiated: 10/23/2017 Target Resolution Date: 02/17/2018 Goal Status: Active Interventions: Complete pain assessment as per visit requirements Notes: ` Wound/Skin Impairment Nursing Diagnoses: Impaired tissue integrity Knowledge deficit related to ulceration/compromised skin integrity Goals: Ulcer/skin  breakdown will have a volume reduction of 80% by week 12 Date Initiated: 10/23/2017 Target Resolution Date: 02/17/2018 Goal Status: Active Interventions: Assess patient/caregiver ability to perform ulcer/skin care regimen upon admission and as needed Assess ulceration(s) every visit Notes: Electronic Signature(s) Signed: 02/02/2018 4:29:16 PM By: Alric Quan Entered By: Alric Quan on 01/30/2018 10:02:39 [name] ([id number]) -------------------------------------------------------------------------------- Pain Assessment Details Patient Name: [name] Date of Service: 01/30/2018 9:15 AM Medical Record Number: [id number] Patient Account Number: [id number] Date of Birth/Sex: [birth year] (84 y.o. F) Treating RN: Roger Shelter Primary Care Lailana Shira: PATIENT, NO Other Clinician: Referring Stalin Gruenberg: Referral, Self Treating Xadrian Craighead/Extender: STONE III, HOYT Weeks in Treatment: 14 Active Problems Location of Pain Severity and Description of Pain Patient Has Paino No Site Locations Pain Management and Medication Current Pain Management: Electronic Signature(s) Signed: 01/30/2018 4:51:25 PM By: Roger Shelter Entered By: Roger Shelter on 01/30/2018 09:33:02 [name] ([id number]) -------------------------------------------------------------------------------- Patient/Caregiver Education Details Patient Name: [name] Date of Service: 01/30/2018 9:15 AM Medical Record Number: [id number] Patient Account Number: [id number] Date of Birth/Gender: 1919/07/15 (84 y.o. F) Treating RN: Cornell Barman Primary Care Physician: PATIENT, NO Other Clinician: Referring Physician: Referral, Self Treating Physician/Extender: Worthy Keeler Weeks in Treatment: 14 Education Assessment Education Provided To: Patient and Caregiver Education Topics Provided Wound/Skin Impairment: Handouts: Caring for Your Ulcer, Other: continue wound care as prescribed Electronic  Signature(s) Signed: 01/31/2018 12:27:24 PM By: Gretta Cool, BSN, RN, CWS, Kim RN, BSN Entered By: Gretta Cool, BSN, RN, CWS, Kim on 01/30/2018 10:17:20 [name] ([id number]) -------------------------------------------------------------------------------- Wound Assessment Details Patient Name: [name] Date of Service: 01/30/2018 9:15 AM Medical Record Number: [id number] Patient Account Number: [id number] Date of Birth/Sex: 1919-06-06 (84 y.o. F) Treating RN: Roger Shelter Primary Care Romello Hoehn: PATIENT, NO Other Clinician: Referring Wataru Mccowen: Referral, Self Treating Pina Sirianni/Extender: STONE III, HOYT Weeks in Treatment: 14 Wound Status Wound Number: 7 Primary Etiology: Arterial Insufficiency Ulcer Wound Location: Right, Dorsal Foot Wound Status: Open Wounding Event: Gradually Appeared Date Acquired: 10/11/2017 Weeks Of Treatment: 14 Clustered Wound: No Photos Photo Uploaded By: Gretta Cool, BSN, RN, CWS, Kim on 02/01/2018 14:55:40 Wound Measurements Length: (cm) 0.5 Width: (cm) 0.5 Depth: (cm) 0.2 Area: (cm) 0.196 Volume: (cm) 0.039 % Reduction in Area: 65.3% % Reduction in Volume: 65.5% Wound Description Classification: Partial Thickness Periwound Skin Texture Texture Color No Abnormalities Noted: No No Abnormalities Noted: No Moisture No Abnormalities Noted: No Treatment Notes Wound #7 (Right, Dorsal Foot) 1. Cleansed with: Clean wound with Normal Saline 2. Anesthetic Topical Lidocaine 4% cream to wound bed prior to debridement 4. Dressing Applied: SAMIYYAH, MOFFA ([id number]) Prisma Ag 5. Secondary Dressing Applied Dry Gauze Tegaderm Electronic Signature(s) Signed: 01/30/2018 4:51:25 PM By: Roger Shelter Entered By: Roger Shelter on 01/30/2018 09:37:08 [name] ([id number]) -------------------------------------------------------------------------------- Vitals Details Patient Name: [name] Date of Service: 01/30/2018 9:15 AM Medical  Record Number: [id number] Patient Account Number: [id number] Date of Birth/Sex: September 05, 1919 (84 y.o. F) Treating RN: Roger Shelter Primary Care Rashae Rother: PATIENT, NO Other Clinician: Referring Ramsha Lonigro: Referral, Self Treating Lafaye Mcelmurry/Extender: STONE III, HOYT Weeks in Treatment: 14 Vital Signs Time Taken: 09:33 Temperature (F): 97.6 Height (in): 64 Pulse (bpm): 76 Weight (lbs): 156 Respiratory Rate (breaths/min): 16 Body Mass Index (BMI): 26.8 Blood Pressure (mmHg): 160/58 Reference Range: 80 - 120 mg / dl Electronic Signature(s) Signed: 01/30/2018 4:51:25 PM By: Roger Shelter Entered By: Roger Shelter on 01/30/2018 09:33:36

## 2018-02-06 NOTE — Progress Notes (Signed)
[name], [name] ([id number]) Visit Report for 01/30/2018 Chief Complaint Document Details Patient Name: [name], [name] Date of Service: 01/30/2018 9:15 AM Medical Record Number: [id number] Patient Account Number: [id number] Date of Birth/Sex: 10-03-1919 (84 y.o. F) Treating RN: Ahmed Prima Primary Care Provider: PATIENT, NO Other Clinician: Referring Provider: Referral, Self Treating Provider/Extender: STONE III, HOYT Weeks in Treatment: 14 Information Obtained from: Patient Chief Complaint Right foot Ulcers Electronic Signature(s) Signed: 01/30/2018 8:59:01 PM By: Worthy Keeler PA-C Entered By: Worthy Keeler on 01/30/2018 09:26:37 [name] ([id number]) -------------------------------------------------------------------------------- Debridement Details Patient Name: [name] Date of Service: 01/30/2018 9:15 AM Medical Record Number: [id number] Patient Account Number: [id number] Date of Birth/Sex: 1919-05-03 (84 y.o. F) Treating RN: Ahmed Prima Primary Care Provider: PATIENT, NO Other Clinician: Referring Provider: Referral, Self Treating Provider/Extender: STONE III, HOYT Weeks in Treatment: 14 Debridement Performed for Wound #7 Right,Dorsal Foot Assessment: Performed By: Physician STONE III, HOYT E., PA-C Debridement Type: Debridement Severity of Tissue Pre Fat layer exposed Debridement: Pre-procedure Verification/Time Yes - 10:08 Out Taken: Start Time: 10:08 Pain Control: Other : benzocaine spray Total Area Debrided (L x W): 0.5 (cm) x 0.5 (cm) = 0.25 (cm) Tissue and other material Viable, Non-Viable, Fat, Slough, Subcutaneous, Slough debrided: Level: Skin/Subcutaneous Tissue Debridement Description: Excisional Instrument: Curette Bleeding: Minimum Hemostasis Achieved: Pressure End Time: 10:10 Procedural Pain: 0 Post Procedural Pain: 0 Response to Treatment: Procedure was tolerated well Post Debridement Measurements of Total  Wound Length: (cm) 0.5 Width: (cm) 0.5 Depth: (cm) 0.3 Volume: (cm) 0.059 Character of Wound/Ulcer Post Debridement: Requires Further Debridement Severity of Tissue Post Debridement: Fat layer exposed Post Procedure Diagnosis Same as Pre-procedure Electronic Signature(s) Signed: 01/30/2018 8:59:01 PM By: Worthy Keeler PA-C Signed: 02/02/2018 4:29:16 PM By: Alric Quan Entered By: Alric Quan on 01/30/2018 10:09:24 [name] ([id number]) -------------------------------------------------------------------------------- HPI Details Patient Name: [name] Date of Service: 01/30/2018 9:15 AM Medical Record Number: [id number] Patient Account Number: [id number] Date of Birth/Sex: 03/09/1919 (84 y.o. F) Treating RN: Ahmed Prima Primary Care Provider: PATIENT, NO Other Clinician: Referring Provider: Referral, Self Treating Provider/Extender: STONE III, HOYT Weeks in Treatment: 14 History of Present Illness HPI Description: 04/06/16; this is a 84 year old woman who arrives accompanied by 2 daughters for a wound on her left ankle and her left fifth toe. These have apparently been present for a year. I'm not quite certain how she came to this clinic however she was being followed by Sharlotte Alamo her podiatrist for these wounds. She was also referred to [facility] and they apparently did a test presumably arterial studies although we don't have any of these results and we couldn't get through to the office today. The family but has been applying a combination of Bactroban and a light bandage and perhaps more recently Silvadene cream. She did have an x-ray of the foot roughly 6 months ago at the podiatry office the family was unaware that if there were any abnormalities. Apparently they have not seen any healing here. Our intake nurse noted a slight skin tear on the right anterior lower leg. Nobody seemed aware of this. ABIs calculated in this clinic was 0.3  on the right and 0.4 on the left I have reviewed things in [facility]. There is very little information on this patient. She apparently follows in current total clinic which we don't have information from. She has mentioned already been to a [facility]. She has a history of hypothyroidism, nephrolithiasis arthritis and has had a previous mastectomy. 04/13/16; patient's x-ray  was normal. She is already been to see Dr. Delana Meyer vascular surgery. Her arterial exam was from November 2016 this showed a left ABI of 0.62 her right of 0.76. Her duplex ultrasound of the left leg showed biphasic waves in the common femoral and distal femoral artery however monophasic waves in the superficial femoral artery proximal and mid biphasic it distal. 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 The patient tells me that she has pain at night when she tries to lie down which is improved by getting up and sitting in the chair this sounds like claudication at rest. Her wounds are on the left medial malleolus and the dorsal fifth toe small punched out wounds that are right on bone. We use Santyl last week 04/20/16: nurse informed me pt has declined evaluation for significant PAD. she denies systemic s/s of infections. 04/27/16;; the patient has had noninvasive arterial studies done in November 2016. ABI and the left was 0.62. Monophasic waves at the superficial femoral artery. Occluded to the posterior tibial artery. So had greater than 50% stenosis of the right superficial femoral and greater than 50% stenosis of the left superficial femoral artery. She had bilateral tibial peroneal artery disease. Both of the wounds on the left fifth toe and left lateral malleolus have been present for more than a year. They have been to see vein and vascular. The patient has some pain but miraculously I think the wounds have largely been stable. No evidence of infection 05/04/16; she goes for a noninvasive study tomorrow and then sees Dr.  Fletcher Anon on Monday. By the time she is here next week we should have a better picture of whether something can be done with regards to her arterial flow. We continue to have ischemic-looking wounds on the left fifth toe and left lateral malleolus. 05/10/16; the patient went for her arterial studies and saw Dr. Fletcher Anon. As predicted she is felt to have critical limb ischemia. The feeling is that she has occlusion of the SFA. The feeling would she would be a candidate for a stent to the SFA. The patient did not make a decision to proceed with the procedure and she is here with family members to discuss this me today. He shouldn't has a lot of pain and cannot sleep and rest well at night per her family. 05/24/16; the patient went and had a complex revascularization/angioplasty of the left superficial femoral artery followed by drug-coated balloon angioplasty and spots stenting. She tolerated the procedure well. She was recommended for dual antiplatelet drugs with Plavix and aspirin for at least a month. She went yesterday for I believe follow-up serial Dopplers and ABIs although I don't see these results. The patient is unfortunately complaining of a lot of pain in her bilateral lower legs below the knees from the ankle to the knees. She apparently was prescribed lidocaine and apparently put this on her legs instead of over the wound areas. This may have something to do with it however there is a lot of edema in her bilateral legs I was able to find her arterial studies from yesterday. The left ABI has improved up to 0.66 post left SFA stent. The bilateral great toe indices remain abnormal with the left being in the 0.25 range. Duplex ultrasound showed her left SFA stent is patent monophasic waveforms persist in the left leg 06/07/16; continued punched-out areas over the dorsal left fifth toe and left lateral malleolus. No major improvement 06/28/16; the areas over her dorsal left fifth toe and  left lateral  malleolus are covered in surface slough we are using Iodoflex 07/05/16. We have been using Iodoflex for 2-3 weeks now. I have not been debridement is because of pain [name], [name] ([id number]) 07/19/16 currently we have been using Iodoflex for roughly the past month. Previously we were unable to debride due to pain although today patient states that the pain is not nearly as severe as it has been in the past. In fact she rates this to be a 1 out of 10 and it worse to a to 10 with palpation of the wound. All and all her and her daughter feel like this is actually doing steadily better at this point in time. She is pleased with progress and we have been seeing her every 2 weeks. 08/02/16; this is a delightful 84 year old woman I have not seen in over a month. She has arterial insufficiency wounds remaining over the left lateral malleolus and the left fifth toe. With the help of Dr. Fletcher Anon we are able to get her revascularized on the left. The 2 wounds on the left have been making good progress and are definitely smaller especially the area over the left lateral malleolus. She is certainly in a lot less pain than she used to be although I still think there is some claudication type pain. Unfortunately she is developed a area on the right lateral foot which is a small open area but I think is threatened. I suspect a small ischemic areas well. Also on her right fifth toe there is an area that is not open but looks as though it is receiving too much pressure from footwear. Finally an area over her right malleolus although I don't think this is on its way to anything ominous 08/17/16 patient presents today for follow up evaluation she tells me that she is really doing fairly well from a pain standpoint compared to where she has been previous. She is tolerating the dressing changes without any complication. She continues to have discharge and drainage however. 08-30-16 Ms. Bunning presents today with her  daughter, she states that she continues to have intermittent shooting pains to the left lateral fifth toe at this site of seems to be a healed ulcer. She denies any other issues that her wound related since her last appointment. Her daughter states that she is in need of a tramadol refill at this time as she uses half a tablet at at bedtime to aid in sleep due to foot pain. Iodoflex has been used on all wounds in previous dressing changes. 09/13/16; the patient has ischemic wounds in her feet. The area over the left lateral malleolus is healed and the area over the left fifth toe looks improved.. She has an area on the lateral aspect of her right foot which is a small but deep wound. Finally she has a new open area on the medial aspect of the left medial malleolus. This is superficial 09/27/16; the area over the left lateral malleolus remains healed. The area over the left fifth toe has a surface and she states the pain is better but I don't think this is completely closed. She has an area on the lateral aspect of her right foot is a small but deep wound. The new open area on the medial aspect of the left ankle was closed from last time. 10/18/16; the area over her left lateral malleolus remains healed. The area over the left fifth toe has a surface over the top of this however there  is no overt open area. Given the underlying issues of severe PAD and continued pain in the toe I would think it would be unlikely this is truly healed however I am not planning to debridement this area. The area on the right lateral foot which was a more recent wound is a small punched out painful area again has significant surface slough and nonviable tissue. It is clear the patient still has claudication type pain however she remains functional. I have been giving her tramadol when necessary and that seems to help a lot with her pain the patient follows up with Dr. Fletcher Anon on January 18/18 11/01/16; patient missed her  follow-up with Dr. Fletcher Anon last week due to a snow day. Follow-up is now on February 15. The areas on the right lateral malleolus and dorsal right fifth toe remain closed over. The fifth toe was tentative is there is a surface eschar however I'm not going to disturb this. Therefore, her only open areas on the right lateral foot. This is a small punched out area. We have been using Prisma. I'm quite convinced this is an ischemic wound 11/15/16; patient has follow-up with Dr. Fletcher Anon on February 15. She has no open wound on the left leg and doesn't really complain of claudication that I can determine from talking to her. However on the right leg she clearly has some degree of claudication. The remaining open wound is on the right lateral foot. We have been using Iodosorb ointment 11/29/16; patient saw Dr. Fletcher Anon on 11/24/16. His comment is that her wound on her right lateral foot seems to be improving with local wound care. She has known significant right SFA disease. Her lower extremity Doppler will be repeated and according to the patient's daughter that appointment is on March 15. He is left with the thought that endovascular intervention in the right SFA might be necessary. The patient has quite a bit of pain especially at night related to the wound in her right foot. We changed her to Iodosorb ointment last week 12/13/16; this is a patient I follow every 2 weeks largely on a palliative approach at this point about ischemic wounds currently in the right foot. Initially she had them on her left lateral malleolus and left fifth toe. The area on the left lateral malleolus healed and the fifth toe has a surface eschar that I have elected not to remove. Both of these improved after revascularization by Dr. Fletcher Anon. We have been using Iodosorb to the right lateral foot not much change here. 12/27/16; the patient had her arterial studies. This showed known bilateral SFA disease. Stable right ABI 0.5 to stable left  ABI at 0.7 to the did not do it TBI on the right it was 0.61 on the left she has a stent in the long segment of her left SFA from 05/18/16. All of her wounds are somewhat better. She states her pain is better in the right foot is improved. 01/10/17- patient is here for follow-up dilation of her right lateral foot ulcer. She has been tolerating Iodosorb. She is voices no complaints or concerns 01/24/17; small ischemic wound on right lateral foot. still non viable cover. follows with Dr. Fletcher Anon on 4/30. No open area on left foot [name], [name] ([id number]) 02/07/17 doing well and states she is pain free. Saw Dr. Fletcher Anon who said she is doing well and has "50% flow in the right leg and 70% in the left. According to her daughter he does not wish to do  any further interventions 02/21/17; small ischemic wound on the right lateral foot. Per her daughter and the patient she has no open wound on the left foot and ankle which were her initial presenting wounds. still has claudication type pain at night she is been to see interventional cardiology who does not feel that she has a need for intervention on the right leg at present. She still has claudication type pain in the right leg which she manages at night with when necessary tramadol that I have prescribed 03/14/17; small ischemic wound on the right lateral foot. They've been dressing this at home with Iodosorb ointment. The patient does not complain of any pain. The wound has a surface eschar over it and there is really no visible open area. This is similar to how the areas on the left leg healed 04/11/17; the small ischemic wound on her right lateral foot is finally closed over. Small amount of eschar over the surface however I did not attempt to remove this. The patient claims to be asymptomatic she is not having any claudication that I'm able to elicit although her activity is limited Readmission: 10/23/17 on evaluation today patient appears to be doing somewhat  poorly in regard to her right foot where she has two ulcers at this point. The worst is on the lateral aspect of her foot medial first metatarsal region is not nearly as bad. With that being said these did arise seemingly out of nowhere she has previously had a similar issue in the past these have always been ischemic wounds due to arterial insufficiency. She does see Dr. Fletcher Anon as her vascular specialist and it has been noted that her ABI's are somewhat low. She actually has a repeat evaluation coming up this March 2019 to reevaluate her blood flow. On the last check 12/22/16 it was noted that patient had stable ABI's in the lower moderate abnormal range in regard to the right in regard to the left and the upper moderate at normal range. Patient is having some discomfort though nothing severe at this point in time. She is seen with her daughter and son-in-law today. No fevers, chills, nausea, or vomiting noted at this time. 10/30/17 on evaluation today patient appears to be doing well in regard to her wounds. The right lateral foot wound appears to show signs of cleaning up nicely there is granulation noted underneath that the bed of the wound although there still some [facility] covering. Nonetheless she still has a lot of discomfort and I really do not want to proceed with debridement due to the fact that she does have so much discomfort. Good news is the tramadol has been helping her at bedtime she only takes this once a day and that has been extremely beneficial. Unfortunately she does not have a primary right now as she is awaiting a new one which is the reason that I am prescribing the tramadol. Nonetheless I'm pleased with how things seem to be progressing in regard to her ulcers. 11/06/17 on evaluation today patient appears to be doing decently well as far as maintaining in regard to her ulcers. Unfortunately I did review her arterial study which shows that she has on the right arterial obstruction  involving the superficial femoral and popliteal artery as well is the superficial femoral artery with a high grade stenosis versus inclusion. Collateral flow noted in the distal portion of the SFA. Severe progression is noted compared to previous study. Unfortunately being the patient's wounds do not seem to  be healing as appropriately and quickly as we would like I do believe this is likely a result of poor vascular flow to the right lower extremity. This was discussed with patient and her daughter during the office visit today. 11/14/17 on evaluation today patient continues to have a fairly stalled ulcer in regard to her to ulcers on the right lower extremity. She obviously did have significant findings on her arterial study and I do believe this is again due to poor vascular flow. She does have an appointment later this afternoon with her vascular specialist for further evaluation to see if there any recommendations at that point. With that being said she continues to have discomfort which I do believe is arterial in nature in regard to insufficiency. 11/21/17 on evaluation today patient appears to be doing fairly well in regard to the appearance of her wounds other than the fact that she does have erythema surrounding the wound bed at this point in fact this encompasses the majority of the dorsal surface of her right foot. There is additional granulation noted today that was not noted previously on evaluation and again if that were alone were the findings that we were seeing I would be very happy with the current progress. However unfortunately she is having the erythema which concerns me for the possibility of infection although the other possibility is that she could just be having a local reaction due to the I resort. In the past she never had this issue however when she use this for the root left foot which makes me again come back to my concern about infection. 11/28/17 on evaluation today  patient is status post having had an angiogram performed on 11/22/17 where it was noted that she had severe disease involving the TP trunk and proximal peroneal artery. Subsequently she underwent successful atherectomy and drug coated balloon angioplasty to the right SFA, TP trunk, and proximal peroneal artery. Patient stated only of has 10% residual stenosis and overall the procedure at the time was both tolerated well as well as considered a success. This was performed by Dr. Sophronia Simas. Patient was subsequently discharged home on 11/23/17 from the hospital. Overall she states that her leg pain is not nearly as severe as what it was previous which is excellent news. She also has been sleeping with her PORSHEA, JANOWSKI ([id number]) leg in the bed previously she was hanging this over the edge due to the pain. She also seems to have much better blood flow on evaluation today. 12/05/17 on evaluation today patient appears to be doing very well in regard to her quite lateral great toe as well as the right lateral foot ulcer. She has excellent blood flow and great capillary refill noted on evaluation today which is excellent news. With that being said she does have discomfort but mainly at the site of the wounds especially lateral foot wound but nothing like what she was experiencing previously. This is rated to be a 3/10 at most. She is seen with her daughter present. 12/12/17 on evaluation today patient appears to be doing very well in regard to her right foot and right medial great toe ulcers. She has been tolerating the dressing changes without complication. She doesn't seem to have any significant discomfort which is also good news. Overall she has been doing excellent since her vascular intervention in my opinion. She actually does have follow-up later today with vascular for a repeat arterial study to ensure everything seems to be doing well following her  surgery. 12/19/17 on evaluation today patient  appears to be doing excellent in regard to her lower extremity ulcers. In fact the right medial great toe ulcer has completely resolved at this point which is excellent news. The right lateral foot ulcer seems to be showing signs of improving week by week and obviously this is good news as well. Overall I'm very pleased with the progress that she has made up to this point. She does have some Slough covering the lateral foot wound which does require sharp debridement today. I did review her vascular note from Dr. Dellis Filbert and it appears that everything seems to be doing well from a vascular standpoint and the patient's foot continues to be warm and appears well vascularized. 01/02/18 on evaluation today patient appears to be doing well in regard to her right lateral foot ulcer. She has been tolerating the dressing changes without complication. There does not appear to be any evidence of infection at this time which is also good news. She has definite new granulation noted. 01/16/18 on evaluation today patient appears to be doing very well. She has been tolerating the dressing changes without complication she has good epithelialization noted at this point. There is no evidence of infection. Overall I'm extremely happy with the progress she seems to be making. 01/30/18 on evaluation today patient presents for follow-up concerning her right lateral foot ulcer. She has been tolerating the dressing changes without complication. There does not appear to be any evidence of infection which is good news. Overall I am definitely pleased with the progress she seems to have made. Patient's daughter is likewise also happy. Nonetheless she is having some issues with nighttime pain previously I had prescribed for her tramadol although she has not had this recently she has been out. This seems to be one reason why her pain at night has been a little worse. Nonetheless fortunately taking one tramadol at bedtime seems to  help with this and she's able to sleep much better. Electronic Signature(s) Signed: 01/30/2018 8:59:01 PM By: Worthy Keeler PA-C Entered By: Worthy Keeler on 01/30/2018 10:40:15 [name] ([id number]) -------------------------------------------------------------------------------- Physical Exam Details Patient Name: [name] Date of Service: 01/30/2018 9:15 AM Medical Record Number: [id number] Patient Account Number: [id number] Date of Birth/Sex: [birth year] (84 y.o. F) Treating RN: Ahmed Prima Primary Care Provider: PATIENT, NO Other Clinician: Referring Provider: Referral, Self Treating Provider/Extender: STONE III, HOYT Weeks in Treatment: 64 Constitutional Well-nourished and well-hydrated in no acute distress. Respiratory normal breathing without difficulty. Psychiatric this patient is able to make decisions and demonstrates good insight into disease process. Alert and Oriented x 3. pleasant and cooperative. Notes Currently patient's wound did have some Slough on the surface of the wound bed which did require sharp debridement actually did not hurt her at all to perform this today. She still seems to have excellent blood flow into the extremity as far as healing is concerned I think is progressing nicely. Electronic Signature(s) Signed: 01/30/2018 8:59:01 PM By: Worthy Keeler PA-C Entered By: Worthy Keeler on 01/30/2018 10:40:53 [name] ([id number]) -------------------------------------------------------------------------------- Physician Orders Details Patient Name: [name] Date of Service: 01/30/2018 9:15 AM Medical Record Number: [id number] Patient Account Number: [id number] Date of Birth/Sex: 1918/10/22 (84 y.o. F) Treating RN: Ahmed Prima Primary Care Provider: PATIENT, NO Other Clinician: Referring Provider: Referral, Self Treating Provider/Extender: STONE III, HOYT Weeks in Treatment: 14 Verbal / Phone Orders: Yes Clinician:  Carolyne Fiscal, Debi Read Back and Verified: Yes Diagnosis Coding ICD-10 Coding Code  Description I70.235 Atherosclerosis of native arteries of right leg with ulceration of other part of foot I70.232 Atherosclerosis of native arteries of right leg with ulceration of calf L97.512 Non-pressure chronic ulcer of other part of right foot with fat layer exposed I10 Essential (primary) hypertension Wound Cleansing Wound #7 Right,Dorsal Foot o Clean wound with Normal Saline. o Cleanse wound with mild soap and water o May Shower, gently pat wound dry prior to applying new dressing. Anesthetic (add to Medication List) Wound #7 Right,Dorsal Foot o Topical Lidocaine 4% cream applied to wound bed prior to debridement (In Clinic Only). o Benzocaine Topical Anesthetic Spray applied to wound bed prior to debridement (In Clinic Only). Skin Barriers/Peri-Wound Care Wound #7 Right,Dorsal Foot o Skin Prep Primary Wound Dressing Wound #7 Right,Dorsal Foot o Silver Collagen - prisma ag Secondary Dressing Wound #7 Right,Dorsal Foot o Dry Gauze o Tegaderm Dressing Change Frequency Wound #7 Right,Dorsal Foot o Change dressing every day. Follow-up Appointments Wound #7 Right,Dorsal Foot o Return Appointment in 2 weeks. Edema Control [name], [name] ([id number]) Wound #7 Right,Dorsal Foot o Elevate legs to the level of the heart and pump ankles as often as possible Off-Loading Wound #7 Right,Dorsal Foot o Turn and reposition every 2 hours Additional Orders / Instructions Wound #7 Right,Dorsal Foot o Increase protein intake. o Other: - Vitamin C, Zinc Patient Medications Allergies: codeine Notifications Medication Indication Start End tramadol DOSE 1 - oral 50 mg tablet - 1 tablet oral at bedtime as needed for pain Electronic Signature(s) Signed: 01/30/2018 8:59:01 PM By: Worthy Keeler PA-C Entered By: Worthy Keeler on 01/30/2018 10:41:52 [name]  ([id number]) -------------------------------------------------------------------------------- Prescription 01/30/2018 Patient Name: [name] Provider: Worthy Keeler PA-C Date of Birth: 1918-12-25 NPI#: [id number] Sex: F DEA#: [id number] Phone #: [phone] License #: Patient Address: [facility] [place], [place] [address] [address] [place],  [address] [phone] Allergies codeine Reaction: sick on stomach Severity: Severe Medication Note: This prescription was automatically generated because the medication is a controlled substance, and cannot be prescribed electronically. Medication: Route: Strength: Form: tramadol oral 50 mg tablet Class: ANALGESICS, NARCOTICS Dose: Frequency / Time: Indication: 1 1 tablet oral at bedtime as needed for pain Number of Refills: Number of Units: 0 Thirty (30) Tablet(s) Generic Substitution: Start Date: End Date: Administered at [place] Pass: No Note to Pharmacy: Signature(s): Date(s): Electronic Signature(s) Signed: 01/30/2018 8:59:01 PM By: [name], Starlett ([id number]) Entered By: Worthy Keeler on 01/30/2018 10:41:53 [name] ([id number]) --------------------------------------------------------------------------------  Problem List Details Patient Name: [name] Date of Service: 01/30/2018 9:15 AM Medical Record Number: [id number] Patient Account Number: [id number] Date of Birth/Sex: [birth year] (84 y.o. F) Treating RN: Ahmed Prima Primary Care Provider: PATIENT, NO Other Clinician: Referring Provider: Referral, Self Treating Provider/Extender: STONE III, HOYT Weeks in Treatment: 14 Active Problems ICD-10 Impacting Encounter Code Description Active Date Wound Healing Diagnosis I70.235 Atherosclerosis of native arteries of right leg with ulceration of  10/23/2017 Yes other part of foot I70.232 Atherosclerosis of native arteries of right leg with ulceration of 10/23/2017 Yes calf L97.512 Non-pressure chronic ulcer of other part of right foot with fat 10/23/2017 Yes layer exposed I10 Essential (primary) hypertension 10/24/2017 Yes Inactive Problems Resolved Problems Electronic Signature(s) Signed: 01/30/2018 8:59:01 PM By: Worthy Keeler PA-C Entered By: Worthy Keeler on 01/30/2018 09:26:30 [name] ([id number]) -------------------------------------------------------------------------------- Progress Note Details Patient Name: [name] Date of Service: 01/30/2018 9:15  AM Medical Record Number: [id number] Patient Account Number: [id number] Date of Birth/Sex: 05/18/1919 (84 y.o. F) Treating RN: Ahmed Prima Primary Care Provider: PATIENT, NO Other Clinician: Referring Provider: Referral, Self Treating Provider/Extender: STONE III, HOYT Weeks in Treatment: 14 Subjective Chief Complaint Information obtained from Patient Right foot Ulcers History of Present Illness (HPI) 04/06/16; this is a 84 year old woman who arrives accompanied by 2 daughters for a wound on her left ankle and her left fifth toe. These have apparently been present for a year. I'm not quite certain how she came to this clinic however she was being followed by Sharlotte Alamo her podiatrist for these wounds. She was also referred to [facility] and they apparently did a test presumably arterial studies although we don't have any of these results and we couldn't get through to the office today. The family but has been applying a combination of Bactroban and a light bandage and perhaps more recently Silvadene cream. She did have an x-ray of the foot roughly 6 months ago at the podiatry office the family was unaware that if there were any abnormalities. Apparently they have not seen any healing here. Our intake nurse noted a slight skin tear on the  right anterior lower leg. Nobody seemed aware of this. ABIs calculated in this clinic was 0.3 on the right and 0.4 on the left I have reviewed things in [facility]. There is very little information on this patient. She apparently follows in current total clinic which we don't have information from. She has mentioned already been to a [facility]. She has a history of hypothyroidism, nephrolithiasis arthritis and has had a previous mastectomy. 04/13/16; patient's x-ray was normal. She is already been to see Dr. Delana Meyer vascular surgery. Her arterial exam was from November 2016 this showed a left ABI of 0.62 her right of 0.76. Her duplex ultrasound of the left leg showed biphasic waves in the common femoral and distal femoral artery however monophasic waves in the superficial femoral artery proximal and mid biphasic it distal. 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 The patient tells me that she has pain at night when she tries to lie down which is improved by getting up and sitting in the chair this sounds like claudication at rest. Her wounds are on the left medial malleolus and the dorsal fifth toe small punched out wounds that are right on bone. We use Santyl last week 04/20/16: nurse informed me pt has declined evaluation for significant PAD. she denies systemic s/s of infections. 04/27/16;; the patient has had noninvasive arterial studies done in November 2016. ABI and the left was 0.62. Monophasic waves at the superficial femoral artery. Occluded to the posterior tibial artery. So had greater than 50% stenosis of the right superficial femoral and greater than 50% stenosis of the left superficial femoral artery. She had bilateral tibial peroneal artery disease. Both of the wounds on the left fifth toe and left lateral malleolus have been present for more than a year. They have been to see vein and vascular. The patient has some pain but miraculously I think the wounds have largely been stable.  No evidence of infection 05/04/16; she goes for a noninvasive study tomorrow and then sees Dr. Fletcher Anon on Monday. By the time she is here next week we should have a better picture of whether something can be done with regards to her arterial flow. We continue to have ischemic-looking wounds on the left fifth toe and left lateral malleolus. 05/10/16; the patient  went for her arterial studies and saw Dr. Fletcher Anon. As predicted she is felt to have critical limb ischemia. The feeling is that she has occlusion of the SFA. The feeling would she would be a candidate for a stent to the SFA. The patient did not make a decision to proceed with the procedure and she is here with family members to discuss this me today. He shouldn't has a lot of pain and cannot sleep and rest well at night per her family. 05/24/16; the patient went and had a complex revascularization/angioplasty of the left superficial femoral artery followed by drug-coated balloon angioplasty and spots stenting. She tolerated the procedure well. She was recommended for dual antiplatelet drugs with Plavix and aspirin for at least a month. She went yesterday for I believe follow-up serial Dopplers and ABIs although I don't see these results. The patient is unfortunately complaining of a lot of pain in her bilateral lower legs below the knees from the ankle to the knees. She apparently was prescribed lidocaine and apparently put this on her legs instead of over the wound areas. This may have something to do with it however there is a lot of edema in her bilateral legs I was able to find her arterial studies from yesterday. The left ABI has improved up to 0.66 post left SFA stent. The bilateral Ridinger, Malesha ([id number]) great toe indices remain abnormal with the left being in the 0.25 range. Duplex ultrasound showed her left SFA stent is patent monophasic waveforms persist in the left leg 06/07/16; continued punched-out areas over the dorsal left fifth  toe and left lateral malleolus. No major improvement 06/28/16; the areas over her dorsal left fifth toe and left lateral malleolus are covered in surface slough we are using Iodoflex 07/05/16. We have been using Iodoflex for 2-3 weeks now. I have not been debridement is because of pain 07/19/16 currently we have been using Iodoflex for roughly the past month. Previously we were unable to debride due to pain although today patient states that the pain is not nearly as severe as it has been in the past. In fact she rates this to be a 1 out of 10 and it worse to a to 10 with palpation of the wound. All and all her and her daughter feel like this is actually doing steadily better at this point in time. She is pleased with progress and we have been seeing her every 2 weeks. 08/02/16; this is a delightful 84 year old woman I have not seen in over a month. She has arterial insufficiency wounds remaining over the left lateral malleolus and the left fifth toe. With the help of Dr. Fletcher Anon we are able to get her revascularized on the left. The 2 wounds on the left have been making good progress and are definitely smaller especially the area over the left lateral malleolus. She is certainly in a lot less pain than she used to be although I still think there is some claudication type pain. Unfortunately she is developed a area on the right lateral foot which is a small open area but I think is threatened. I suspect a small ischemic areas well. Also on her right fifth toe there is an area that is not open but looks as though it is receiving too much pressure from footwear. Finally an area over her right malleolus although I don't think this is on its way to anything ominous 08/17/16 patient presents today for follow up evaluation she tells me that she  is really doing fairly well from a pain standpoint compared to where she has been previous. She is tolerating the dressing changes without any complication. She  continues to have discharge and drainage however. 08-30-16 Ms. Barnhill presents today with her daughter, she states that she continues to have intermittent shooting pains to the left lateral fifth toe at this site of seems to be a healed ulcer. She denies any other issues that her wound related since her last appointment. Her daughter states that she is in need of a tramadol refill at this time as she uses half a tablet at at bedtime to aid in sleep due to foot pain. Iodoflex has been used on all wounds in previous dressing changes. 09/13/16; the patient has ischemic wounds in her feet. The area over the left lateral malleolus is healed and the area over the left fifth toe looks improved.. She has an area on the lateral aspect of her right foot which is a small but deep wound. Finally she has a new open area on the medial aspect of the left medial malleolus. This is superficial 09/27/16; the area over the left lateral malleolus remains healed. The area over the left fifth toe has a surface and she states the pain is better but I don't think this is completely closed. She has an area on the lateral aspect of her right foot is a small but deep wound. The new open area on the medial aspect of the left ankle was closed from last time. 10/18/16; the area over her left lateral malleolus remains healed. The area over the left fifth toe has a surface over the top of this however there is no overt open area. Given the underlying issues of severe PAD and continued pain in the toe I would think it would be unlikely this is truly healed however I am not planning to debridement this area. The area on the right lateral foot which was a more recent wound is a small punched out painful area again has significant surface slough and nonviable tissue. It is clear the patient still has claudication type pain however she remains functional. I have been giving her tramadol when necessary and that seems to help a lot with  her pain the patient follows up with Dr. Fletcher Anon on January 18/18 11/01/16; patient missed her follow-up with Dr. Fletcher Anon last week due to a snow day. Follow-up is now on February 15. The areas on the right lateral malleolus and dorsal right fifth toe remain closed over. The fifth toe was tentative is there is a surface eschar however I'm not going to disturb this. Therefore, her only open areas on the right lateral foot. This is a small punched out area. We have been using Prisma. I'm quite convinced this is an ischemic wound 11/15/16; patient has follow-up with Dr. Fletcher Anon on February 15. She has no open wound on the left leg and doesn't really complain of claudication that I can determine from talking to her. However on the right leg she clearly has some degree of claudication. The remaining open wound is on the right lateral foot. We have been using Iodosorb ointment 11/29/16; patient saw Dr. Fletcher Anon on 11/24/16. His comment is that her wound on her right lateral foot seems to be improving with local wound care. She has known significant right SFA disease. Her lower extremity Doppler will be repeated and according to the patient's daughter that appointment is on March 15. He is left with the thought that  endovascular intervention in the right SFA might be necessary. The patient has quite a bit of pain especially at night related to the wound in her right foot. We changed her to Iodosorb ointment last week 12/13/16; this is a patient I follow every 2 weeks largely on a palliative approach at this point about ischemic wounds currently in the right foot. Initially she had them on her left lateral malleolus and left fifth toe. The area on the left lateral malleolus healed and the fifth toe has a surface eschar that I have elected not to remove. Both of these improved after revascularization by Dr. Fletcher Anon. We have been using Iodosorb to the right lateral foot not much change here. 12/27/16; the patient had her  arterial studies. This showed known bilateral SFA disease. Stable right ABI 0.5 to stable left ABI at 0.7 to the did not do it TBI on the right it was 0.61 on the left she has a stent in the long segment of her left SFA from [name], [name] ([id number]) 05/18/16. All of her wounds are somewhat better. She states her pain is better in the right foot is improved. 01/10/17- patient is here for follow-up dilation of her right lateral foot ulcer. She has been tolerating Iodosorb. She is voices no complaints or concerns 01/24/17; small ischemic wound on right lateral foot. still non viable cover. follows with Dr. Fletcher Anon on 4/30. No open area on left foot 02/07/17 doing well and states she is pain free. Saw Dr. Fletcher Anon who said she is doing well and has "50% flow in the right leg and 70% in the left. According to her daughter he does not wish to do any further interventions 02/21/17; small ischemic wound on the right lateral foot. Per her daughter and the patient she has no open wound on the left foot and ankle which were her initial presenting wounds. still has claudication type pain at night she is been to see interventional cardiology who does not feel that she has a need for intervention on the right leg at present. She still has claudication type pain in the right leg which she manages at night with when necessary tramadol that I have prescribed 03/14/17; small ischemic wound on the right lateral foot. They've been dressing this at home with Iodosorb ointment. The patient does not complain of any pain. The wound has a surface eschar over it and there is really no visible open area. This is similar to how the areas on the left leg healed 04/11/17; the small ischemic wound on her right lateral foot is finally closed over. Small amount of eschar over the surface however I did not attempt to remove this. The patient claims to be asymptomatic she is not having any claudication that I'm able to elicit although her  activity is limited Readmission: 10/23/17 on evaluation today patient appears to be doing somewhat poorly in regard to her right foot where she has two ulcers at this point. The worst is on the lateral aspect of her foot medial first metatarsal region is not nearly as bad. With that being said these did arise seemingly out of nowhere she has previously had a similar issue in the past these have always been ischemic wounds due to arterial insufficiency. She does see Dr. Fletcher Anon as her vascular specialist and it has been noted that her ABI's are somewhat low. She actually has a repeat evaluation coming up this March 2019 to reevaluate her blood flow. On the last check 12/22/16 it  was noted that patient had stable ABI's in the lower moderate abnormal range in regard to the right in regard to the left and the upper moderate at normal range. Patient is having some discomfort though nothing severe at this point in time. She is seen with her daughter and son-in-law today. No fevers, chills, nausea, or vomiting noted at this time. 10/30/17 on evaluation today patient appears to be doing well in regard to her wounds. The right lateral foot wound appears to show signs of cleaning up nicely there is granulation noted underneath that the bed of the wound although there still some [facility] covering. Nonetheless she still has a lot of discomfort and I really do not want to proceed with debridement due to the fact that she does have so much discomfort. Good news is the tramadol has been helping her at bedtime she only takes this once a day and that has been extremely beneficial. Unfortunately she does not have a primary right now as she is awaiting a new one which is the reason that I am prescribing the tramadol. Nonetheless I'm pleased with how things seem to be progressing in regard to her ulcers. 11/06/17 on evaluation today patient appears to be doing decently well as far as maintaining in regard to her  ulcers. Unfortunately I did review her arterial study which shows that she has on the right arterial obstruction involving the superficial femoral and popliteal artery as well is the superficial femoral artery with a high grade stenosis versus inclusion. Collateral flow noted in the distal portion of the SFA. Severe progression is noted compared to previous study. Unfortunately being the patient's wounds do not seem to be healing as appropriately and quickly as we would like I do believe this is likely a result of poor vascular flow to the right lower extremity. This was discussed with patient and her daughter during the office visit today. 11/14/17 on evaluation today patient continues to have a fairly stalled ulcer in regard to her to ulcers on the right lower extremity. She obviously did have significant findings on her arterial study and I do believe this is again due to poor vascular flow. She does have an appointment later this afternoon with her vascular specialist for further evaluation to see if there any recommendations at that point. With that being said she continues to have discomfort which I do believe is arterial in nature in regard to insufficiency. 11/21/17 on evaluation today patient appears to be doing fairly well in regard to the appearance of her wounds other than the fact that she does have erythema surrounding the wound bed at this point in fact this encompasses the majority of the dorsal surface of her right foot. There is additional granulation noted today that was not noted previously on evaluation and again if that were alone were the findings that we were seeing I would be very happy with the current progress. However unfortunately she is having the erythema which concerns me for the possibility of infection although the other possibility is that she could just be having a local reaction due to the I resort. In the past she never had this issue however when she use this for  the root left foot which makes me again come back to my concern about infection. [name], [name] ([id number]) 11/28/17 on evaluation today patient is status post having had an angiogram performed on 11/22/17 where it was noted that she had severe disease involving the TP trunk and proximal peroneal artery.  Subsequently she underwent successful atherectomy and drug coated balloon angioplasty to the right SFA, TP trunk, and proximal peroneal artery. Patient stated only of has 10% residual stenosis and overall the procedure at the time was both tolerated well as well as considered a success. This was performed by Dr. Sophronia Simas. Patient was subsequently discharged home on 11/23/17 from the hospital. Overall she states that her leg pain is not nearly as severe as what it was previous which is excellent news. She also has been sleeping with her leg in the bed previously she was hanging this over the edge due to the pain. She also seems to have much better blood flow on evaluation today. 12/05/17 on evaluation today patient appears to be doing very well in regard to her quite lateral great toe as well as the right lateral foot ulcer. She has excellent blood flow and great capillary refill noted on evaluation today which is excellent news. With that being said she does have discomfort but mainly at the site of the wounds especially lateral foot wound but nothing like what she was experiencing previously. This is rated to be a 3/10 at most. She is seen with her daughter present. 12/12/17 on evaluation today patient appears to be doing very well in regard to her right foot and right medial great toe ulcers. She has been tolerating the dressing changes without complication. She doesn't seem to have any significant discomfort which is also good news. Overall she has been doing excellent since her vascular intervention in my opinion. She actually does have follow-up later today with vascular for a repeat arterial study  to ensure everything seems to be doing well following her surgery. 12/19/17 on evaluation today patient appears to be doing excellent in regard to her lower extremity ulcers. In fact the right medial great toe ulcer has completely resolved at this point which is excellent news. The right lateral foot ulcer seems to be showing signs of improving week by week and obviously this is good news as well. Overall I'm very pleased with the progress that she has made up to this point. She does have some Slough covering the lateral foot wound which does require sharp debridement today. I did review her vascular note from Dr. Dellis Filbert and it appears that everything seems to be doing well from a vascular standpoint and the patient's foot continues to be warm and appears well vascularized. 01/02/18 on evaluation today patient appears to be doing well in regard to her right lateral foot ulcer. She has been tolerating the dressing changes without complication. There does not appear to be any evidence of infection at this time which is also good news. She has definite new granulation noted. 01/16/18 on evaluation today patient appears to be doing very well. She has been tolerating the dressing changes without complication she has good epithelialization noted at this point. There is no evidence of infection. Overall I'm extremely happy with the progress she seems to be making. 01/30/18 on evaluation today patient presents for follow-up concerning her right lateral foot ulcer. She has been tolerating the dressing changes without complication. There does not appear to be any evidence of infection which is good news. Overall I am definitely pleased with the progress she seems to have made. Patient's daughter is likewise also happy. Nonetheless she is having some issues with nighttime pain previously I had prescribed for her tramadol although she has not had this recently she has been out. This seems to be one reason why  her  pain at night has been a little worse. Nonetheless fortunately taking one tramadol at bedtime seems to help with this and she's able to sleep much better. Patient History Information obtained from Patient. Family History Diabetes - Mother, Heart Disease - Siblings, Stroke - Siblings, No family history of Cancer, Hereditary Spherocytosis, Hypertension, Kidney Disease, Lung Disease, Seizures, Thyroid Problems, Tuberculosis. Social History Never smoker, Marital Status - Widowed, Alcohol Use - Never, Drug Use - No History, Caffeine Use - Daily. Review of Systems (ROS) Constitutional Symptoms (General Health) Denies complaints or symptoms of Fever, Chills. Respiratory The patient has no complaints or symptoms. [name], [name] ([id number]) Cardiovascular The patient has no complaints or symptoms. Psychiatric The patient has no complaints or symptoms. Objective Constitutional Well-nourished and well-hydrated in no acute distress. Vitals Time Taken: 9:33 AM, Height: 64 in, Weight: 156 lbs, BMI: 26.8, Temperature: 97.6 F, Pulse: 76 bpm, Respiratory Rate: 16 breaths/min, Blood Pressure: 160/58 mmHg. Respiratory normal breathing without difficulty. Psychiatric this patient is able to make decisions and demonstrates good insight into disease process. Alert and Oriented x 3. pleasant and cooperative. General Notes: Currently patient's wound did have some Slough on the surface of the wound bed which did require sharp debridement actually did not hurt her at all to perform this today. She still seems to have excellent blood flow into the extremity as far as healing is concerned I think is progressing nicely. Integumentary (Hair, Skin) Wound #7 status is Open. Original cause of wound was Gradually Appeared. The wound is located on the Right,Dorsal Foot. The wound measures 0.5cm length x 0.5cm width x 0.2cm depth; 0.196cm^2 area and 0.039cm^3 volume. 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 There was a Excisional Skin/Subcutaneous Tissue Debridement with a total area of 0.25 sq cm performed by STONE III, HOYT E., PA-C. With the following instrument(s): Curette. to remove Viable and Non- Viable tissue/material Material removed includes Fat, Subcutaneous Tissue, and Henry after achieving pain control using Other (benzocaine spray). No specimens were taken. A time out was conducted at 10:08, prior to the start of the procedure. A Minimum amount of bleeding was controlled with Pressure. The procedure was tolerated well with a pain level of 0 throughout and a pain level of 0 following the procedure. Post Debridement Measurements: 0.5cm length x 0.5cm width x 0.3cm depth; 0.059cm^3 volume. Character of Wound/Ulcer Post Debridement requires further debridement. Severity of Tissue Post Debridement is: Fat layer exposed. Post procedure Diagnosis Wound #7: Same as Pre-Procedure Plan Wound Cleansing: Wound #7 Right,Dorsal Foot: Clean wound with Normal Saline. Cleanse wound with mild soap and water May Shower, gently pat wound dry prior to applying new dressing. Anesthetic (add to Medication List): Wound #7 Right,Dorsal Foot: Topical Lidocaine 4% cream applied to wound bed prior to debridement (In Clinic Only). Benzocaine Topical Anesthetic Spray applied to wound bed prior to debridement (In Clinic Only). Skin Barriers/Peri-Wound Care: Wound #7 Right,Dorsal Foot: Skin  Prep Primary Wound Dressing: Wound #7 Right,Dorsal Foot: Silver Collagen - prisma ag Secondary Dressing: Wound #7 Right,Dorsal Foot:  Dry Gauze Tegaderm Dressing Change Frequency: Wound #7 Right,Dorsal Foot: Change dressing every day. Follow-up Appointments: Wound #7 Right,Dorsal Foot: Return Appointment in 2 weeks. Edema Control: Wound #7 Right,Dorsal Foot: Elevate legs to the level of the heart and pump ankles as often as possible Off-Loading: Wound #7 Right,Dorsal Foot: Turn and reposition every 2 hours Additional Orders / Instructions: Wound #7 Right,Dorsal Foot: Increase protein intake. Other: - Vitamin C, Zinc The following medication(s) was prescribed: tramadol oral 50 mg tablet 1 1 tablet oral at bedtime as needed for pain [name], [name] ([id number]) I am going to recommend that we continue with the Current wound care measures for the next week and she is in agreement with the plan. Her daughter was also likewise in agreement. We will see were things stand in one weeks time we see her for reevaluation. Please see above for specific wound care orders. We will see patient for re-evaluation in 1 week(s) here in the clinic. If anything worsens or changes patient will contact our office for additional recommendations. Electronic Signature(s) Signed: 01/30/2018 8:59:01 PM By: Worthy Keeler PA-C Entered By: Worthy Keeler on 01/30/2018 10:42:09 [name] ([id number]) -------------------------------------------------------------------------------- ROS/PFSH Details Patient Name: [name] Date of Service: 01/30/2018 9:15 AM Medical Record Number: [id number] Patient Account Number: [id number] Date of Birth/Sex: 14-Nov-1918 (84 y.o. F) Treating RN: Ahmed Prima Primary Care Provider: PATIENT, NO Other Clinician: Referring Provider: Referral, Self Treating Provider/Extender: STONE III, HOYT Weeks in Treatment: 14 Information Obtained From Patient Wound  History Do you currently have one or more open woundso Yes How many open wounds do you currently haveo 1 Approximately how long have you had your woundso 1.5 weeks How have you been treating your wound(s) until nowo bandaide Has your wound(s) ever healed and then re-openedo No Have you had any lab work done in the past montho No Have you tested positive for an antibiotic resistant organism (MRSA, VRE)o No Have you tested positive for osteomyelitis (bone infection)o No Have you had any tests for circulation on your legso Yes Where [facility] the test [facility] Have you had other problems associated with your woundso Swelling Constitutional Symptoms (General Health) Complaints and Symptoms: Negative for: Fever; Chills Eyes Medical History: Positive for: Cataracts - had surgery Respiratory Complaints and Symptoms: No Complaints or Symptoms Cardiovascular Complaints and Symptoms: No Complaints or Symptoms Medical History: Positive for: Hypertension Musculoskeletal Medical History: Positive for: Osteoarthritis Oncologic Medical History: Negative for: Received Chemotherapy; Received Radiation Psychiatric [name], [name] ([id number]) Complaints and Symptoms: No Complaints or Symptoms HBO Extended History Items Eyes: Cataracts Immunizations Pneumococcal Vaccine: Received Pneumococcal Vaccination: Yes Implantable Devices Family and Social History Cancer: No; Diabetes: Yes - Mother; Heart Disease: Yes - Siblings; Hereditary Spherocytosis: No; Hypertension: No; Kidney Disease: No; Lung Disease: No; Seizures: No; Stroke: Yes - Siblings; Thyroid Problems: No; Tuberculosis: No; Never smoker; Marital Status - Widowed; Alcohol Use: Never; Drug Use: No History; Caffeine Use: Daily; Financial Concerns: No; Food, Clothing or Shelter Needs: No; Support System Lacking: No; Transportation Concerns: No; Advanced Directives: No; Patient does not want information on Advanced Directives; Do not  resuscitate: No; Living Will: No; Medical Power of Attorney: No Physician Affirmation I have reviewed and agree with the above information. Electronic Signature(s) Signed: 01/30/2018 8:59:01 PM By: Worthy Keeler PA-C Signed: 02/02/2018 4:29:16 PM By: Alric Quan Entered By: Worthy Keeler on 01/30/2018 10:40:33 [name] ([id number]) -------------------------------------------------------------------------------- SuperBill Details Patient Name: [name] Date of Service: 01/30/2018 Medical Record Number: [id number] Patient Account Number:  [id number] Date of Birth/Sex: 07/17/1919 (84 y.o. 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 I10 Essential (primary) hypertension Facility Procedures CPT4 Code: [id number] Description: 09233 - DEB SUBQ TISSUE 20 SQ CM/< ICD-10 Diagnosis Description L97.512 Non-pressure chronic ulcer of other part of right foot with fat l Modifier: ayer exposed Quantity: 1 Physician Procedures CPT4 Code: [id number] Description: 33354 - WC PHYS SUBQ TISS 20 SQ CM ICD-10 Diagnosis Description L97.512 Non-pressure chronic ulcer of other part of right foot with fat l Modifier: ayer exposed Quantity: 1 Electronic Signature(s) Signed: 01/30/2018 8:59:01 PM By: Worthy Keeler PA-C Entered By: Worthy Keeler on 01/30/2018 10:42:25

## 2018-02-13 ENCOUNTER — Encounter: Payer: Medicare PPO | Attending: Physician Assistant | Admitting: Physician Assistant

## 2018-02-13 DIAGNOSIS — I10 Essential (primary) hypertension: Secondary | ICD-10-CM | POA: Diagnosis not present

## 2018-02-13 DIAGNOSIS — L97519 Non-pressure chronic ulcer of other part of right foot with unspecified severity: Secondary | ICD-10-CM | POA: Insufficient documentation

## 2018-02-13 DIAGNOSIS — Z8249 Family history of ischemic heart disease and other diseases of the circulatory system: Secondary | ICD-10-CM | POA: Diagnosis not present

## 2018-02-13 DIAGNOSIS — M1990 Unspecified osteoarthritis, unspecified site: Secondary | ICD-10-CM | POA: Insufficient documentation

## 2018-02-13 DIAGNOSIS — Z823 Family history of stroke: Secondary | ICD-10-CM | POA: Insufficient documentation

## 2018-02-15 NOTE — Progress Notes (Signed)
[name], [name] ([id number]) Visit Report for 02/13/2018 Chief Complaint Document Details Patient Name: [name] Date of Service: 02/13/2018 11:00 AM Medical Record Number: [id number] Patient Account Number: [id number] Date of Birth/Sex: [birth year] (84 y.o. F) Treating RN: [name] Primary Care Provider: PATIENT, [name] Other Clinician: Referring Provider: Referral, [name] Treating Provider/Extender: [name] Weeks in Treatment: 16 Information Obtained from: Patient Chief Complaint Right foot Ulcers Electronic Signature(s) Signed: 02/13/2018 5:56:33 PM By: [name] Keeler PA-C Entered By: [name] on 02/13/2018 11:24:10 [name] ([id number]) -------------------------------------------------------------------------------- HPI Details Patient Name: [name] Date of Service: 02/13/2018 11:00 AM Medical Record Number: [id number] Patient Account Number: [id number] Date of Birth/Sex: 10/17/1918 (84 y.o. F) Treating RN: [name] Primary Care Provider: PATIENT, [name] Other Clinician: Referring Provider: Referral, [name] Treating Provider/Extender: [name] Weeks in Treatment: 16 History of Present Illness HPI Description: 04/06/16; this is a 84 year old woman who arrives accompanied by 2 daughters for a wound on her left ankle and her left fifth toe. These have apparently been present for a year. I'm not quite certain how she came to this clinic however she was being followed by [name] her podiatrist for these wounds. She was also referred to [facility] and they apparently did a test presumably arterial studies although we don't have any of these results and we couldn't get through to the office today. The family but has been applying a combination of Bactroban and a light bandage and perhaps more recently Silvadene cream. She did have an x-ray of the foot roughly 6 months ago at the podiatry office the family was unaware that if  there were any abnormalities. Apparently they have not seen any healing here. Our intake nurse noted a slight skin tear on the right anterior lower leg. Nobody seemed aware of this. ABIs calculated in this clinic was 0.3 on the right and 0.4 on the left I have reviewed things in [facility]. There is very little information on this patient. She apparently follows in current [facility] clinic which we don't have information from. She has mentioned already been to a [facility]. She has a history of hypothyroidism, nephrolithiasis arthritis and has had a previous mastectomy. 04/13/16; patient's x-ray was normal. She is already been to see [name] vascular surgery. Her arterial exam was from November 2016 this showed a left ABI of 0.62 her right of 0.76. Her duplex ultrasound of the left leg showed biphasic waves in the common femoral and distal femoral artery however monophasic waves in the superficial femoral artery proximal and mid biphasic it distal. 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 The patient tells me that she has pain at night when she tries to lie down which is improved by getting up and sitting in the chair this sounds like claudication at rest. Her wounds are on the left medial malleolus and the dorsal fifth toe small punched out wounds that are right on bone. We use Santyl last week 04/20/16: nurse informed me pt has declined evaluation for significant PAD. she denies systemic s/s of infections. 04/27/16;; the patient has had noninvasive arterial studies done in November 2016. ABI and the left was 0.62. Monophasic waves at the superficial femoral artery. Occluded to the posterior tibial artery. So had greater than 50% stenosis of the right superficial femoral and greater than 50% stenosis of the left superficial femoral artery. She had bilateral tibial peroneal artery disease. Both of the wounds on the left fifth toe and left lateral malleolus have  been present for more than a year. They  have been to see vein and vascular. The patient has some pain but miraculously I think the wounds have largely been stable. [name] evidence of infection 05/04/16; she goes for a noninvasive study tomorrow and then sees [name] on Monday. By the time she is here next week we should have a better picture of whether something can be done with regards to her arterial flow. We continue to have ischemic-looking wounds on the left fifth toe and left lateral malleolus. 05/10/16; the patient went for her arterial studies and saw [name]. As predicted she is felt to have critical limb ischemia. The feeling is that she has occlusion of the SFA. The feeling would she would be a candidate for a stent to the SFA. The patient did not make a decision to proceed with the procedure and she is here with family members to discuss this me today. He shouldn't has a lot of pain and cannot sleep and rest well at night per her family. 05/24/16; the patient went and had a complex revascularization/angioplasty of the left superficial femoral artery followed by drug-coated balloon angioplasty and spots stenting. She tolerated the procedure well. She was recommended for dual antiplatelet drugs with Plavix and aspirin for at least a month. She went yesterday for I believe follow-up serial Dopplers and ABIs although I don't see these results. The patient is unfortunately complaining of a lot of pain in her bilateral lower legs below the knees from the ankle to the knees. She apparently was prescribed lidocaine and apparently put this on her legs instead of over the wound areas. This may have something to do with it however there is a lot of edema in her bilateral legs I was able to find her arterial studies from yesterday. The left ABI has improved up to 0.66 post left SFA stent. The bilateral great toe indices remain abnormal with the left being in the 0.25 range. Duplex ultrasound showed her left SFA stent is patent monophasic  waveforms persist in the left leg 06/07/16; continued punched-out areas over the dorsal left fifth toe and left lateral malleolus. [name] major improvement 06/28/16; the areas over her dorsal left fifth toe and left lateral malleolus are covered in surface slough we are using Iodoflex 07/05/16. We have been using Iodoflex for 2-3 weeks now. I have not been debridement is because of pain [name], [name] ([id number]) 07/19/16 currently we have been using Iodoflex for roughly the past month. Previously we were unable to debride due to pain although today patient states that the pain is not nearly as severe as it has been in the past. In fact she rates this to be a 1 out of 10 and it worse to a to 10 with palpation of the wound. All and all her and her daughter feel like this is actually doing steadily better at this point in time. She is pleased with progress and we have been seeing her every 2 weeks. 08/02/16; this is a delightful 84 year old woman I have not seen in over a month. She has arterial insufficiency wounds remaining over the left lateral malleolus and the left fifth toe. With the help of [name] we are able to get her revascularized on the left. The 2 wounds on the left have been making good progress and are definitely smaller especially the area over the left lateral malleolus. She is certainly in a lot less pain than she used to be although I still  think there is some claudication type pain. Unfortunately she is developed a area on the right lateral foot which is a small open area but I think is threatened. I suspect a small ischemic areas well. Also on her right fifth toe there is an area that is not open but looks as though it is receiving too much pressure from footwear. Finally an area over her right malleolus although I don't think this is on its way to anything ominous 08/17/16 patient presents today for follow up evaluation she tells me that she is really doing fairly well from a pain  standpoint compared to where she has been previous. She is tolerating the dressing changes without any complication. She continues to have discharge and drainage however. 08-30-16 Ms. Alles presents today with her daughter, she states that she continues to have intermittent shooting pains to the left lateral fifth toe at this site of seems to be a healed ulcer. She denies any other issues that her wound related since her last appointment. Her daughter states that she is in need of a tramadol refill at this time as she uses half a tablet at at bedtime to aid in sleep due to foot pain. Iodoflex has been used on all wounds in previous dressing changes. 09/13/16; the patient has ischemic wounds in her feet. The area over the left lateral malleolus is healed and the area over the left fifth toe looks improved.. She has an area on the lateral aspect of her right foot which is a small but deep wound. Finally she has a new open area on the medial aspect of the left medial malleolus. This is superficial 09/27/16; the area over the left lateral malleolus remains healed. The area over the left fifth toe has a surface and she states the pain is better but I don't think this is completely closed. She has an area on the lateral aspect of her right foot is a small but deep wound. The new open area on the medial aspect of the left ankle was closed from last time. 10/18/16; the area over her left lateral malleolus remains healed. The area over the left fifth toe has a surface over the top of this however there is [name] overt open area. Given the underlying issues of severe PAD and continued pain in the toe I would think it would be unlikely this is truly healed however I am not planning to debridement this area. The area on the right lateral foot which was a more recent wound is a small punched out painful area again has significant surface slough and nonviable tissue. It is clear the patient still has claudication  type pain however she remains functional. I have been giving her tramadol when necessary and that seems to help a lot with her pain the patient follows up with [name] on January 18/18 11/01/16; patient missed her follow-up with [name] last week due to a snow day. Follow-up is now on February 15. The areas on the right lateral malleolus and dorsal right fifth toe remain closed over. The fifth toe was tentative is there is a surface eschar however I'm not going to disturb this. Therefore, her only open areas on the right lateral foot. This is a small punched out area. We have been using Prisma. I'm quite convinced this is an ischemic wound 11/15/16; patient has follow-up with [name] on February 15. She has [name] open wound on the left leg and doesn't really complain of claudication that  I can determine from talking to her. However on the right leg she clearly has some degree of claudication. The remaining open wound is on the right lateral foot. We have been using Iodosorb ointment 11/29/16; patient saw [name] on 11/24/16. His comment is that her wound on her right lateral foot seems to be improving with local wound care. She has known significant right SFA disease. Her lower extremity Doppler will be repeated and according to the patient's daughter that appointment is on March 15. He is left with the thought that endovascular intervention in the right SFA might be necessary. The patient has quite a bit of pain especially at night related to the wound in her right foot. We changed her to Iodosorb ointment last week 12/13/16; this is a patient I follow every 2 weeks largely on a palliative approach at this point about ischemic wounds currently in the right foot. Initially she had them on her left lateral malleolus and left fifth toe. The area on the left lateral malleolus healed and the fifth toe has a surface eschar that I have elected not to remove. Both of these improved after revascularization by  [name]. We have been using Iodosorb to the right lateral foot not much change here. 12/27/16; the patient had her arterial studies. This showed known bilateral SFA disease. Stable right ABI 0.5 to stable left ABI at 0.7 to the did not do it TBI on the right it was 0.61 on the left she has a stent in the long segment of her left SFA from 05/18/16. All of her wounds are somewhat better. She states her pain is better in the right foot is improved. 01/10/17- patient is here for follow-up dilation of her right lateral foot ulcer. She has been tolerating Iodosorb. She is voices [name] complaints or concerns 01/24/17; small ischemic wound on right lateral foot. still non viable cover. follows with [name] on 4/30. [name] open area on left foot [name], [name] ([id number]) 02/07/17 doing well and states she is pain free. Saw [name] who said she is doing well and has "50% flow in the right leg and 70% in the left. According to her daughter he does not wish to do any further interventions 02/21/17; small ischemic wound on the right lateral foot. Per her daughter and the patient she has [name] open wound on the left foot and ankle which were her initial presenting wounds. still has claudication type pain at night she is been to see interventional cardiology who does not feel that she has a need for intervention on the right leg at present. She still has claudication type pain in the right leg which she manages at night with when necessary tramadol that I have prescribed 03/14/17; small ischemic wound on the right lateral foot. They've been dressing this at home with Iodosorb ointment. The patient does not complain of any pain. The wound has a surface eschar over it and there is really [name] visible open area. This is similar to how the areas on the left leg healed 04/11/17; the small ischemic wound on her right lateral foot is finally closed over. Small amount of eschar over the surface however I did not attempt to remove  this. The patient claims to be asymptomatic she is not having any claudication that I'm able to elicit although her activity is limited Readmission: 10/23/17 on evaluation today patient appears to be doing somewhat poorly in regard to her right foot where she has two ulcers at  this point. The worst is on the lateral aspect of her foot medial first metatarsal region is not nearly as bad. With that being said these did arise seemingly out of nowhere she has previously had a similar issue in the past these have always been ischemic wounds due to arterial insufficiency. She does see [name] as her vascular specialist and it has been noted that her ABI's are somewhat low. She actually has a repeat evaluation coming up this March 2019 to reevaluate her blood flow. On the last check 12/22/16 it was noted that patient had stable ABI's in the lower moderate abnormal range in regard to the right in regard to the left and the upper moderate at normal range. Patient is having some discomfort though nothing severe at this point in time. She is seen with her daughter and son-in-law today. [name] fevers, chills, nausea, or vomiting noted at this time. 10/30/17 on evaluation today patient appears to be doing well in regard to her wounds. The right lateral foot wound appears to show signs of cleaning up nicely there is granulation noted underneath that the bed of the wound although there still some [facility] covering. Nonetheless she still has a lot of discomfort and I really do not want to proceed with debridement due to the fact that she does have so much discomfort. Good news is the tramadol has been helping her at bedtime she only takes this once a day and that has been extremely beneficial. Unfortunately she does not have a primary right now as she is awaiting a new one which is the reason that I am prescribing the tramadol. Nonetheless I'm pleased with how things seem to be progressing in regard to her  ulcers. 11/06/17 on evaluation today patient appears to be doing decently well as far as maintaining in regard to her ulcers. Unfortunately I did review her arterial study which shows that she has on the right arterial obstruction involving the superficial femoral and popliteal artery as well is the superficial femoral artery with a high grade stenosis versus inclusion. Collateral flow noted in the distal portion of the SFA. Severe progression is noted compared to previous study. Unfortunately being the patient's wounds do not seem to be healing as appropriately and quickly as we would like I do believe this is likely a result of poor vascular flow to the right lower extremity. This was discussed with patient and her daughter during the office visit today. 11/14/17 on evaluation today patient continues to have a fairly stalled ulcer in regard to her to ulcers on the right lower extremity. She obviously did have significant findings on her arterial study and I do believe this is again due to poor vascular flow. She does have an appointment later this afternoon with her vascular specialist for further evaluation to see if there any recommendations at that point. With that being said she continues to have discomfort which I do believe is arterial in nature in regard to insufficiency. 11/21/17 on evaluation today patient appears to be doing fairly well in regard to the appearance of her wounds other than the fact that she does have erythema surrounding the wound bed at this point in fact this encompasses the majority of the dorsal surface of her right foot. There is additional granulation noted today that was not noted previously on evaluation and again if that were alone were the findings that we were seeing I would be very happy with the current progress. However unfortunately she is having  the erythema which concerns me for the possibility of infection although the other possibility is that she could  just be having a local reaction due to the I resort. In the past she never had this issue however when she use this for the root left foot which makes me again come back to my concern about infection. 11/28/17 on evaluation today patient is status post having had an angiogram performed on 11/22/17 where it was noted that she had severe disease involving the TP trunk and proximal peroneal artery. Subsequently she underwent successful atherectomy and drug coated balloon angioplasty to the right SFA, TP trunk, and proximal peroneal artery. Patient stated only of has 10% residual stenosis and overall the procedure at the time was both tolerated well as well as considered a success. This was performed by Dr. Sophronia Simas. Patient was subsequently discharged home on 11/23/17 from the hospital. Overall she states that her leg pain is not nearly as severe as what it was previous which is excellent news. She also has been sleeping with her DANESE, DORSAINVIL ([id number]) leg in the bed previously she was hanging this over the edge due to the pain. She also seems to have much better blood flow on evaluation today. 12/05/17 on evaluation today patient appears to be doing very well in regard to her quite lateral great toe as well as the right lateral foot ulcer. She has excellent blood flow and great capillary refill noted on evaluation today which is excellent news. With that being said she does have discomfort but mainly at the site of the wounds especially lateral foot wound but nothing like what she was experiencing previously. This is rated to be a 3/10 at most. She is seen with her daughter present. 12/12/17 on evaluation today patient appears to be doing very well in regard to her right foot and right medial great toe ulcers. She has been tolerating the dressing changes without complication. She doesn't seem to have any significant discomfort which is also good news. Overall she has been doing excellent since her  vascular intervention in my opinion. She actually does have follow-up later today with vascular for a repeat arterial study to ensure everything seems to be doing well following her surgery. 12/19/17 on evaluation today patient appears to be doing excellent in regard to her lower extremity ulcers. In fact the right medial great toe ulcer has completely resolved at this point which is excellent news. The right lateral foot ulcer seems to be showing signs of improving week by week and obviously this is good news as well. Overall I'm very pleased with the progress that she has made up to this point. She does have some Slough covering the lateral foot wound which does require sharp debridement today. I did review her vascular note from Dr. Dellis Filbert and it appears that everything seems to be doing well from a vascular standpoint and the patient's foot continues to be warm and appears well vascularized. 01/02/18 on evaluation today patient appears to be doing well in regard to her right lateral foot ulcer. She has been tolerating the dressing changes without complication. There does not appear to be any evidence of infection at this time which is also good news. She has definite new granulation noted. 01/16/18 on evaluation today patient appears to be doing very well. She has been tolerating the dressing changes without complication she has good epithelialization noted at this point. There is [name] evidence of infection. Overall I'm extremely happy with the progress she  seems to be making. 01/30/18 on evaluation today patient presents for follow-up concerning her right lateral foot ulcer. She has been tolerating the dressing changes without complication. There does not appear to be any evidence of infection which is good news. Overall I am definitely pleased with the progress she seems to have made. Patient's daughter is likewise also happy. Nonetheless she is having some issues with nighttime pain previously I had  prescribed for her tramadol although she has not had this recently she has been out. This seems to be one reason why her pain at night has been a little worse. Nonetheless fortunately taking one tramadol at bedtime seems to help with this and she's able to sleep much better. 02/13/18 on evaluation today patient appears to be doing very well in regard to her right lateral foot ulcer. She is making progress albeit slow in regard to the healing of this wound she still seems to have a warm foot and toes which is great news. Obviously in the beginning she had a lot of vascular issues. Overall I'm pleased. Electronic Signature(s) Signed: 02/13/2018 5:56:33 PM By: [name] Keeler PA-C Entered By: [name] on 02/13/2018 13:25:43 [name] ([id number]) -------------------------------------------------------------------------------- Physical Exam Details Patient Name: [name] Date of Service: 02/13/2018 11:00 AM Medical Record Number: [id number] Patient Account Number: [id number] Date of Birth/Sex: 09-05-1919 (84 y.o. F) Treating RN: [name] Primary Care Provider: PATIENT, [name] Other Clinician: Referring Provider: Referral, [name] Treating Provider/Extender: [name] Weeks in Treatment: 36 Constitutional Well-nourished and well-hydrated in [name] acute distress. Respiratory normal breathing without difficulty. clear to auscultation bilaterally. Cardiovascular regular rate and rhythm with normal S1, S2. Psychiatric this patient is able to make decisions and demonstrates good insight into disease process. Alert and Oriented x 3. pleasant and cooperative. Notes Patient's wound bed have some Slough noted on the surface of the wound which was mechanically debrided utilizing saline and gauze and she tolerated this without complication. Post debridement the wound bed appear to be clean and looked very well. [name] sharp debridement required. Electronic Signature(s) Signed: 02/13/2018  5:56:33 PM By: [name] Keeler PA-C Entered By: [name] on 02/13/2018 13:26:17 [name] ([id number]) -------------------------------------------------------------------------------- Physician Orders Details Patient Name: [name] Date of Service: 02/13/2018 11:00 AM Medical Record Number: [id number] Patient Account Number: [id number] Date of Birth/Sex: 09/09/1919 (84 y.o. F) Treating RN: [name] Primary Care Provider: PATIENT, [name] Other Clinician: Referring Provider: Referral, [name] Treating Provider/Extender: [name] Weeks in Treatment: 16 Verbal / Phone Orders: Yes Clinician: Carolyne Fiscal, Debi Read Back and Verified: Yes Diagnosis Coding ICD-10 Coding Code Description I70.235 Atherosclerosis of native arteries of right leg with ulceration of other part of foot I70.232 Atherosclerosis of native arteries of right leg with ulceration of calf L97.512 Non-pressure chronic ulcer of other part of right foot with fat layer exposed I10 Essential (primary) hypertension Wound Cleansing Wound #7 Right,Dorsal Foot o Clean wound with Normal Saline. o Cleanse wound with mild soap and water o May Shower, gently pat wound dry prior to applying new dressing. Anesthetic (add to Medication List) Wound #7 Right,Dorsal Foot o Topical Lidocaine 4% cream applied to wound bed prior to debridement (In Clinic Only). o Benzocaine Topical Anesthetic Spray applied to wound bed prior to debridement (In Clinic Only). Skin Barriers/Peri-Wound Care Wound #7 Right,Dorsal Foot o Skin Prep Primary Wound Dressing Wound #7 Right,Dorsal Foot o Silver Collagen - prisma ag Secondary Dressing Wound #7 Right,Dorsal Foot o Dry Gauze o Tegaderm Dressing Change  Frequency Wound #7 Right,Dorsal Foot o Change dressing every day. Follow-up Appointments Wound #7 Right,Dorsal Foot o Return Appointment in 2 weeks. Edema Control Weichel, Icelynn ([id number]) Wound #7  Right,Dorsal Foot o Elevate legs to the level of the heart and pump ankles as often as possible Off-Loading Wound #7 Right,Dorsal Foot o Turn and reposition every 2 hours Additional Orders / Instructions Wound #7 Right,Dorsal Foot o Increase protein intake. o Other: - Vitamin C, Zinc Patient Medications Allergies: codeine Notifications Medication Indication Start End lidocaine DOSE 1 - topical 4 % cream - 1 cream topical Electronic Signature(s) Signed: 02/13/2018 5:11:14 PM By: Alric Quan Signed: 02/13/2018 5:56:33 PM By: [name] Keeler PA-C Entered By: Alric Quan on 02/13/2018 11:48:42 [name] ([id number]) -------------------------------------------------------------------------------- Prescription 02/13/2018 Patient Name: [name] Provider: Worthy Keeler PA-C Date of Birth: December 12, 1918 NPI#: [id number] Sex: F DEA#: [id number] Phone #: [phone] License #: Patient Address: [facility] [place], [place] [address] [address], [place],  [address] [phone] Allergies codeine Reaction: sick on stomach Severity: Severe Medication Medication: Route: Strength: Form: lidocaine topical 4% cream Class: TOPICAL LOCAL ANESTHETICS Dose: Frequency / Time: Indication: 1 1 cream topical Number of Refills: Number of Units: 0 Generic Substitution: Start Date: End Date: Administered at [place]: Yes Time Administered: Time Discontinued: Note to Pharmacy: Signature(s): Date(s): Electronic Signature(s) Signed: 02/13/2018 5:11:14 PM By: Alric Quan Signed: 02/13/2018 5:56:33 PM By: [name], Sherisse ([id number]) Entered By: Alric Quan on 02/13/2018 11:48:44 [name] ([id number]) --------------------------------------------------------------------------------  Problem List  Details Patient Name: [name] Date of Service: 02/13/2018 11:00 AM Medical Record Number: [id number] Patient Account Number: [id number] Date of Birth/Sex: 30-May-1919 (84 y.o. F) Treating RN: [name] Primary Care Provider: PATIENT, [name] Other Clinician: Referring Provider: Referral, [name] Treating Provider/Extender: [name] Weeks in Treatment: 16 Active Problems ICD-10 Impacting Encounter Code Description Active Date Wound Healing Diagnosis I70.235 Atherosclerosis of native arteries of right leg with ulceration of 10/23/2017 Yes other part of foot I70.232 Atherosclerosis of native arteries of right leg with ulceration of 10/23/2017 Yes calf L97.512 Non-pressure chronic ulcer of other part of right foot with fat 10/23/2017 Yes layer exposed I10 Essential (primary) hypertension 10/24/2017 Yes Inactive Problems Resolved Problems Electronic Signature(s) Signed: 02/13/2018 5:56:33 PM By: [name] Keeler PA-C Entered By: [name] on 02/13/2018 11:24:01 [name] ([id number]) -------------------------------------------------------------------------------- Progress Note Details Patient Name: [name] Date of Service: 02/13/2018 11:00 AM Medical Record Number: [id number] Patient Account Number: [id number] Date of Birth/Sex: 21-May-1919 (84 y.o. F) Treating RN: [name] Primary Care Provider: PATIENT, [name] Other Clinician: Referring Provider: Referral, [name] Treating Provider/Extender: [name] Weeks in Treatment: 16 Subjective Chief Complaint Information obtained from Patient Right foot Ulcers History of Present Illness (HPI) 04/06/16; this is a 84 year old woman who arrives accompanied by 2 daughters for a wound on her left ankle and her left fifth toe. These have apparently been present for a year. I'm not quite certain how she came to this clinic however she was being followed by [name] her podiatrist for these wounds. She was also  referred to [facility] and they apparently did a test presumably arterial studies although we don't have any of these results and we couldn't get through to the office today. The family but has been applying a combination of Bactroban and a light bandage and perhaps more recently Silvadene cream. She did have an  x-ray of the foot roughly 6 months ago at the podiatry office the family was unaware that if there were any abnormalities. Apparently they have not seen any healing here. Our intake nurse noted a slight skin tear on the right anterior lower leg. Nobody seemed aware of this. ABIs calculated in this clinic was 0.3 on the right and 0.4 on the left I have reviewed things in [facility]. There is very little information on this patient. She apparently follows in current total clinic which we don't have information from. She has mentioned already been to a [facility]. She has a history of hypothyroidism, nephrolithiasis arthritis and has had a previous mastectomy. 04/13/16; patient's x-ray was normal. She is already been to see [name] vascular surgery. Her arterial exam was from November 2016 this showed a left ABI of 0.62 her right of 0.76. Her duplex ultrasound of the left leg showed biphasic waves in the common femoral and distal femoral artery however monophasic waves in the superficial femoral artery proximal and mid biphasic it distal. 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 The patient tells me that she has pain at night when she tries to lie down which is improved by getting up and sitting in the chair this sounds like claudication at rest. Her wounds are on the left medial malleolus and the dorsal fifth toe small punched out wounds that are right on bone. We use Santyl last week 04/20/16: nurse informed me pt has declined evaluation for significant PAD. she denies systemic s/s of infections. 04/27/16;; the patient has had noninvasive arterial studies done in November  2016. ABI and the left was 0.62. Monophasic waves at the superficial femoral artery. Occluded to the posterior tibial artery. So had greater than 50% stenosis of the right superficial femoral and greater than 50% stenosis of the left superficial femoral artery. She had bilateral tibial peroneal artery disease. Both of the wounds on the left fifth toe and left lateral malleolus have been present for more than a year. They have been to see vein and vascular. The patient has some pain but miraculously I think the wounds have largely been stable. [name] evidence of infection 05/04/16; she goes for a noninvasive study tomorrow and then sees [name] on Monday. By the time she is here next week we should have a better picture of whether something can be done with regards to her arterial flow. We continue to have ischemic-looking wounds on the left fifth toe and left lateral malleolus. 05/10/16; the patient went for her arterial studies and saw [name]. As predicted she is felt to have critical limb ischemia. The feeling is that she has occlusion of the SFA. The feeling would she would be a candidate for a stent to the SFA. The patient did not make a decision to proceed with the procedure and she is here with family members to discuss this me today. He shouldn't has a lot of pain and cannot sleep and rest well at night per her family. 05/24/16; the patient went and had a complex revascularization/angioplasty of the left superficial femoral artery followed by drug-coated balloon angioplasty and spots stenting. She tolerated the procedure well. She was recommended for dual antiplatelet drugs with Plavix and aspirin for at least a month. She went yesterday for I believe follow-up serial Dopplers and ABIs although I don't see these results. The patient is unfortunately complaining of a lot of pain in her bilateral lower legs below the knees from the ankle to the knees.  She apparently was prescribed lidocaine and  apparently put this on her legs instead of over the wound areas. This may have something to do with it however there is a lot of edema in her bilateral legs I was able to find her arterial studies from yesterday. The left ABI has improved up to 0.66 post left SFA stent. The bilateral Reagle, Annalea ([id number]) great toe indices remain abnormal with the left being in the 0.25 range. Duplex ultrasound showed her left SFA stent is patent monophasic waveforms persist in the left leg 06/07/16; continued punched-out areas over the dorsal left fifth toe and left lateral malleolus. [name] major improvement 06/28/16; the areas over her dorsal left fifth toe and left lateral malleolus are covered in surface slough we are using Iodoflex 07/05/16. We have been using Iodoflex for 2-3 weeks now. I have not been debridement is because of pain 07/19/16 currently we have been using Iodoflex for roughly the past month. Previously we were unable to debride due to pain although today patient states that the pain is not nearly as severe as it has been in the past. In fact she rates this to be a 1 out of 10 and it worse to a to 10 with palpation of the wound. All and all her and her daughter feel like this is actually doing steadily better at this point in time. She is pleased with progress and we have been seeing her every 2 weeks. 08/02/16; this is a delightful 84 year old woman I have not seen in over a month. She has arterial insufficiency wounds remaining over the left lateral malleolus and the left fifth toe. With the help of [name] we are able to get her revascularized on the left. The 2 wounds on the left have been making good progress and are definitely smaller especially the area over the left lateral malleolus. She is certainly in a lot less pain than she used to be although I still think there is some claudication type pain. Unfortunately she is developed a area on the right lateral foot which is a small open  area but I think is threatened. I suspect a small ischemic areas well. Also on her right fifth toe there is an area that is not open but looks as though it is receiving too much pressure from footwear. Finally an area over her right malleolus although I don't think this is on its way to anything ominous 08/17/16 patient presents today for follow up evaluation she tells me that she is really doing fairly well from a pain standpoint compared to where she has been previous. She is tolerating the dressing changes without any complication. She continues to have discharge and drainage however. 08-30-16 Ms. Sendejo presents today with her daughter, she states that she continues to have intermittent shooting pains to the left lateral fifth toe at this site of seems to be a healed ulcer. She denies any other issues that her wound related since her last appointment. Her daughter states that she is in need of a tramadol refill at this time as she uses half a tablet at at bedtime to aid in sleep due to foot pain. Iodoflex has been used on all wounds in previous dressing changes. 09/13/16; the patient has ischemic wounds in her feet. The area over the left lateral malleolus is healed and the area over the left fifth toe looks improved.. She has an area on the lateral aspect of her right foot which is a small but deep  wound. Finally she has a new open area on the medial aspect of the left medial malleolus. This is superficial 09/27/16; the area over the left lateral malleolus remains healed. The area over the left fifth toe has a surface and she states the pain is better but I don't think this is completely closed. She has an area on the lateral aspect of her right foot is a small but deep wound. The new open area on the medial aspect of the left ankle was closed from last time. 10/18/16; the area over her left lateral malleolus remains healed. The area over the left fifth toe has a surface over the top of  this however there is [name] overt open area. Given the underlying issues of severe PAD and continued pain in the toe I would think it would be unlikely this is truly healed however I am not planning to debridement this area. The area on the right lateral foot which was a more recent wound is a small punched out painful area again has significant surface slough and nonviable tissue. It is clear the patient still has claudication type pain however she remains functional. I have been giving her tramadol when necessary and that seems to help a lot with her pain the patient follows up with [name] on January 18/18 11/01/16; patient missed her follow-up with [name] last week due to a snow day. Follow-up is now on February 15. The areas on the right lateral malleolus and dorsal right fifth toe remain closed over. The fifth toe was tentative is there is a surface eschar however I'm not going to disturb this. Therefore, her only open areas on the right lateral foot. This is a small punched out area. We have been using Prisma. I'm quite convinced this is an ischemic wound 11/15/16; patient has follow-up with [name] on February 15. She has [name] open wound on the left leg and doesn't really complain of claudication that I can determine from talking to her. However on the right leg she clearly has some degree of claudication. The remaining open wound is on the right lateral foot. We have been using Iodosorb ointment 11/29/16; patient saw [name] on 11/24/16. His comment is that her wound on her right lateral foot seems to be improving with local wound care. She has known significant right SFA disease. Her lower extremity Doppler will be repeated and according to the patient's daughter that appointment is on March 15. He is left with the thought that endovascular intervention in the right SFA might be necessary. The patient has quite a bit of pain especially at night related to the wound in her right foot.  We changed her to Iodosorb ointment last week 12/13/16; this is a patient I follow every 2 weeks largely on a palliative approach at this point about ischemic wounds currently in the right foot. Initially she had them on her left lateral malleolus and left fifth toe. The area on the left lateral malleolus healed and the fifth toe has a surface eschar that I have elected not to remove. Both of these improved after revascularization by [name]. We have been using Iodosorb to the right lateral foot not much change here. 12/27/16; the patient had her arterial studies. This showed known bilateral SFA disease. Stable right ABI 0.5 to stable left ABI at 0.7 to the did not do it TBI on the right it was 0.61 on the left she has a stent in the long segment of  her left SFA from [place], [name] ([id number]) 05/18/16. All of her wounds are somewhat better. She states her pain is better in the right foot is improved. 01/10/17- patient is here for follow-up dilation of her right lateral foot ulcer. She has been tolerating Iodosorb. She is voices [name] complaints or concerns 01/24/17; small ischemic wound on right lateral foot. still non viable cover. follows with [name] on 4/30. [name] open area on left foot 02/07/17 doing well and states she is pain free. Saw [name] who said she is doing well and has "50% flow in the right leg and 70% in the left. According to her daughter he does not wish to do any further interventions 02/21/17; small ischemic wound on the right lateral foot. Per her daughter and the patient she has [name] open wound on the left foot and ankle which were her initial presenting wounds. still has claudication type pain at night she is been to see interventional cardiology who does not feel that she has a need for intervention on the right leg at present. She still has claudication type pain in the right leg which she manages at night with when necessary tramadol that I have prescribed 03/14/17; small ischemic  wound on the right lateral foot. They've been dressing this at home with Iodosorb ointment. The patient does not complain of any pain. The wound has a surface eschar over it and there is really [name] visible open area. This is similar to how the areas on the left leg healed 04/11/17; the small ischemic wound on her right lateral foot is finally closed over. Small amount of eschar over the surface however I did not attempt to remove this. The patient claims to be asymptomatic she is not having any claudication that I'm able to elicit although her activity is limited Readmission: 10/23/17 on evaluation today patient appears to be doing somewhat poorly in regard to her right foot where she has two ulcers at this point. The worst is on the lateral aspect of her foot medial first metatarsal region is not nearly as bad. With that being said these did arise seemingly out of nowhere she has previously had a similar issue in the past these have always been ischemic wounds due to arterial insufficiency. She does see [name] as her vascular specialist and it has been noted that her ABI's are somewhat low. She actually has a repeat evaluation coming up this March 2019 to reevaluate her blood flow. On the last check 12/22/16 it was noted that patient had stable ABI's in the lower moderate abnormal range in regard to the right in regard to the left and the upper moderate at normal range. Patient is having some discomfort though nothing severe at this point in time. She is seen with her daughter and son-in-law today. [name] fevers, chills, nausea, or vomiting noted at this time. 10/30/17 on evaluation today patient appears to be doing well in regard to her wounds. The right lateral foot wound appears to show signs of cleaning up nicely there is granulation noted underneath that the bed of the wound although there still some [name] covering. Nonetheless she still has a lot of discomfort and I really do not want to proceed  with debridement due to the fact that she does have so much discomfort. Good news is the tramadol has been helping her at bedtime she only takes this once a day and that has been extremely beneficial. Unfortunately she does not have a primary right now  as she is awaiting a new one which is the reason that I am prescribing the tramadol. Nonetheless I'm pleased with how things seem to be progressing in regard to her ulcers. 11/06/17 on evaluation today patient appears to be doing decently well as far as maintaining in regard to her ulcers. Unfortunately I did review her arterial study which shows that she has on the right arterial obstruction involving the superficial femoral and popliteal artery as well is the superficial femoral artery with a high grade stenosis versus inclusion. Collateral flow noted in the distal portion of the SFA. Severe progression is noted compared to previous study. Unfortunately being the patient's wounds do not seem to be healing as appropriately and quickly as we would like I do believe this is likely a result of poor vascular flow to the right lower extremity. This was discussed with patient and her daughter during the office visit today. 11/14/17 on evaluation today patient continues to have a fairly stalled ulcer in regard to her to ulcers on the right lower extremity. She obviously did have significant findings on her arterial study and I do believe this is again due to poor vascular flow. She does have an appointment later this afternoon with her vascular specialist for further evaluation to see if there any recommendations at that point. With that being said she continues to have discomfort which I do believe is arterial in nature in regard to insufficiency. 11/21/17 on evaluation today patient appears to be doing fairly well in regard to the appearance of her wounds other than the fact that she does have erythema surrounding the wound bed at this point in fact this  encompasses the majority of the dorsal surface of her right foot. There is additional granulation noted today that was not noted previously on evaluation and again if that were alone were the findings that we were seeing I would be very happy with the current progress. However unfortunately she is having the erythema which concerns me for the possibility of infection although the other possibility is that she could just be having a local reaction due to the I resort. In the past she never had this issue however when she use this for the root left foot which makes me again come back to my concern about infection. [name], [name] ([id number]) 11/28/17 on evaluation today patient is status post having had an angiogram performed on 11/22/17 where it was noted that she had severe disease involving the TP trunk and proximal peroneal artery. Subsequently she underwent successful atherectomy and drug coated balloon angioplasty to the right SFA, TP trunk, and proximal peroneal artery. Patient stated only of has 10% residual stenosis and overall the procedure at the time was both tolerated well as well as considered a success. This was performed by Dr. Sophronia Simas. Patient was subsequently discharged home on 11/23/17 from the hospital. Overall she states that her leg pain is not nearly as severe as what it was previous which is excellent news. She also has been sleeping with her leg in the bed previously she was hanging this over the edge due to the pain. She also seems to have much better blood flow on evaluation today. 12/05/17 on evaluation today patient appears to be doing very well in regard to her quite lateral great toe as well as the right lateral foot ulcer. She has excellent blood flow and great capillary refill noted on evaluation today which is excellent news. With that being said she does have discomfort but  mainly at the site of the wounds especially lateral foot wound but nothing like what she was  experiencing previously. This is rated to be a 3/10 at most. She is seen with her daughter present. 12/12/17 on evaluation today patient appears to be doing very well in regard to her right foot and right medial great toe ulcers. She has been tolerating the dressing changes without complication. She doesn't seem to have any significant discomfort which is also good news. Overall she has been doing excellent since her vascular intervention in my opinion. She actually does have follow-up later today with vascular for a repeat arterial study to ensure everything seems to be doing well following her surgery. 12/19/17 on evaluation today patient appears to be doing excellent in regard to her lower extremity ulcers. In fact the right medial great toe ulcer has completely resolved at this point which is excellent news. The right lateral foot ulcer seems to be showing signs of improving week by week and obviously this is good news as well. Overall I'm very pleased with the progress that she has made up to this point. She does have some Slough covering the lateral foot wound which does require sharp debridement today. I did review her vascular note from Dr. Dellis Filbert and it appears that everything seems to be doing well from a vascular standpoint and the patient's foot continues to be warm and appears well vascularized. 01/02/18 on evaluation today patient appears to be doing well in regard to her right lateral foot ulcer. She has been tolerating the dressing changes without complication. There does not appear to be any evidence of infection at this time which is also good news. She has definite new granulation noted. 01/16/18 on evaluation today patient appears to be doing very well. She has been tolerating the dressing changes without complication she has good epithelialization noted at this point. There is [name] evidence of infection. Overall I'm extremely happy with the progress she seems to be making. 01/30/18 on  evaluation today patient presents for follow-up concerning her right lateral foot ulcer. She has been tolerating the dressing changes without complication. There does not appear to be any evidence of infection which is good news. Overall I am definitely pleased with the progress she seems to have made. Patient's daughter is likewise also happy. Nonetheless she is having some issues with nighttime pain previously I had prescribed for her tramadol although she has not had this recently she has been out. This seems to be one reason why her pain at night has been a little worse. Nonetheless fortunately taking one tramadol at bedtime seems to help with this and she's able to sleep much better. 02/13/18 on evaluation today patient appears to be doing very well in regard to her right lateral foot ulcer. She is making progress albeit slow in regard to the healing of this wound she still seems to have a warm foot and toes which is great news. Obviously in the beginning she had a lot of vascular issues. Overall I'm pleased. Patient History Information obtained from Patient. Family History Diabetes - Mother, Heart Disease - Siblings, Stroke - Siblings, [name] family history of Cancer, Hereditary Spherocytosis, Hypertension, Kidney Disease, Lung Disease, Seizures, Thyroid Problems, Tuberculosis. Social History Never smoker, Marital Status - Widowed, Alcohol Use - Never, Drug Use - [name] History, Caffeine Use - Daily. Review of Systems (ROS) [name], [name] ([id number]) Constitutional Symptoms (General Health) Denies complaints or symptoms of Fever, Chills. Respiratory The patient has [name] complaints or  symptoms. Cardiovascular The patient has [name] complaints or symptoms. Psychiatric The patient has [name] complaints or symptoms. Objective Constitutional Well-nourished and well-hydrated in [name] acute distress. Vitals Time Taken: 10:58 AM, Height: 64 in, Weight: 156 lbs, BMI: 26.8, Temperature: 98.1 F, Pulse: 85 bpm,  Respiratory Rate: 16 breaths/min, Blood Pressure: 163/57 mmHg. Respiratory normal breathing without difficulty. clear to auscultation bilaterally. Cardiovascular regular rate and rhythm with normal S1, S2. Psychiatric this patient is able to make decisions and demonstrates good insight into disease process. Alert and Oriented x 3. pleasant and cooperative. General Notes: Patient's wound bed have some Slough noted on the surface of the wound which was mechanically debrided utilizing saline and gauze and she tolerated this without complication. Post debridement the wound bed appear to be clean and looked very well. [name] sharp debridement required. Integumentary (Hair, Skin) Wound #7 status is Open. Original cause of wound was Gradually Appeared. The wound is located on the Right,Dorsal Foot. The wound measures 0.5cm length x 0.7cm width x 0.2cm depth; 0.275cm^2 area and 0.055cm^3 volume. There is Fat Layer (Subcutaneous Tissue) Exposed exposed. There is [name] tunneling or undermining noted. There is a small amount of serous drainage noted. The wound margin is epibole. There is small (1-33%) pink granulation within the wound bed. There is a large (67-100%) amount of necrotic tissue within the wound bed including Adherent Slough. The periwound skin appearance did not exhibit: Callus, Crepitus, Excoriation, Induration, Rash, Scarring, Dry/Scaly, Maceration, Atrophie Blanche, Cyanosis, Ecchymosis, Hemosiderin Staining, Mottled, Pallor, Rubor, Erythema. 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 Cleanse wound with mild soap and water May Shower, gently pat wound dry prior to applying new dressing. Anesthetic (add to Medication List): Wound #7 Right,Dorsal Foot: Topical Lidocaine 4% cream applied to wound bed prior to debridement (In Clinic Only). Benzocaine Topical Anesthetic Spray applied to wound bed prior to debridement (In Clinic Only). Skin Barriers/Peri-Wound Care: Wound #7 Right,Dorsal Foot: Skin Prep Primary Wound Dressing: Wound #7 Right,Dorsal Foot: Silver Collagen - prisma ag Secondary Dressing: Wound #7 Right,Dorsal Foot: Dry Gauze Tegaderm Dressing Change Frequency: Wound #7 Right,Dorsal Foot: Change dressing every day. Follow-up Appointments: Wound #7 Right,Dorsal Foot: Return Appointment in 2 weeks. Edema Control: Wound #7 Right,Dorsal Foot: Elevate legs to the level of the heart and pump ankles as often as possible Off-Loading: Wound #7 Right,Dorsal Foot: Turn and reposition every 2 hours Additional Orders / Instructions: Wound #7 Right,Dorsal Foot: Increase protein intake. Other: - Vitamin C, Zinc The following medication(s) was prescribed: lidocaine topical 4 % cream 1 1 cream topical was prescribed at facility I'm gonna suggest currently that we continue with the Current wound care measures for the next week since she seems to be doing well. She's in agreement with this plan. If anything changes she will let us know. Please see above for specific wound care orders. We will see patient for re-evaluation in 2 week(s) here in the clinic. If anything worsens or changes patient will contact our office for additional recommendations. [name], [name] ([id number]) Electronic Signature(s) Signed: 02/13/2018 5:56:33 PM By: [name] Keeler PA-C Entered By: [name] on 02/13/2018 13:26:48 [name] ([id number]) --------------------------------------------------------------------------------  ROS/PFSH Details Patient Name: KEWANNA, KASPRZAK Date of Service: 02/13/2018 11:00 AM Medical Record Number: [id number] Patient Account Number: [id number] Date of Birth/Sex: [birth year] (84 y.o. F) Treating RN: [name] Primary Care Provider: PATIENT, [name] Other Clinician: Referring Provider: Referral, [name] Treating Provider/Extender: [name] Weeks in Treatment: 16 Information Obtained From Patient Wound History Do you currently have one or more open woundso Yes How many open wounds do you currently haveo 1 Approximately how long have you had your woundso 1.5 weeks How have you been treating your wound(s) until nowo bandaide Has your wound(s) ever healed and then re-openedo [name] Have you had any lab work done in the past montho [name] Have you tested positive for an antibiotic resistant organism (MRSA, VRE)o [name] Have you tested positive for osteomyelitis (bone infection)o [name] Have you had any tests for circulation on your legso Yes [facility] the test [facility] Have you had other problems associated with your woundso Swelling Constitutional Symptoms (General Health) Complaints and Symptoms: Negative for: Fever; Chills Eyes Medical History: Positive for: Cataracts - had surgery Respiratory Complaints and Symptoms: [name] Complaints or Symptoms Cardiovascular Complaints and Symptoms: [name] Complaints or Symptoms Medical History: Positive for: Hypertension Musculoskeletal Medical History: Positive for: Osteoarthritis Oncologic Medical History: Negative for: Received Chemotherapy; Received Radiation Psychiatric [name], [name] ([id number]) Complaints and Symptoms: [name] Complaints or Symptoms HBO Extended History Items Eyes: Cataracts Immunizations Pneumococcal Vaccine: Received Pneumococcal Vaccination: Yes Implantable Devices Family and Social History Cancer: [name]; Diabetes: Yes - Mother; Heart Disease: Yes - Siblings; Hereditary Spherocytosis: [name]; Hypertension: [name]; Kidney Disease: [name]; Lung Disease: [name]; Seizures:  [name]; Stroke: Yes - Siblings; Thyroid Problems: [name]; Tuberculosis: [name]; Never smoker; Marital Status - Widowed; Alcohol Use: Never; Drug Use: [name] History; Caffeine Use: Daily; Financial Concerns: [name]; Food, Clothing or Shelter Needs: [name]; Support System Lacking: [name]; Transportation Concerns: [name]; Advanced Directives: [name]; Patient does not want information on Advanced Directives; Do not resuscitate: [name]; Living Will: [name]; Medical Power of Attorney: [name] Physician Affirmation I have reviewed and agree with the above information. Electronic Signature(s) Signed: 02/13/2018 5:11:14 PM By: Alric Quan Signed: 02/13/2018 5:56:33 PM By: [name] Keeler PA-C Entered By: [name] on 02/13/2018 13:25:59 [name] ([id number]) -------------------------------------------------------------------------------- SuperBill Details Patient Name: [name] Date of Service: 02/13/2018 Medical Record Number: [id number] Patient Account Number: [id number] Date of Birth/Sex: May 26, 1919 (84 y.o. 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 I10 Essential (primary) hypertension Facility Procedures CPT4 Code: 32992426 Description: 99213 - WOUND CARE VISIT-LEV 3 EST PT Modifier: Quantity: 1 Physician Procedures CPT4 Code Description: [id number] - WC PHYS LEVEL 3 - EST PT ICD-10 Diagnosis Description I70.235 Atherosclerosis of native arteries of right leg with ulceration o I70.232 Atherosclerosis of native arteries of right leg with ulceration o L97.512   Non-pressure chronic ulcer of other part of right foot with fat l I10 Essential (primary) hypertension Modifier: f other part of fo f calf ayer exposed Quantity: 1 ot Electronic Signature(s) Signed: 02/13/2018 5:56:33 PM By: [name] Keeler PA-C Entered By: [name] on 02/13/2018 13:29:51

## 2018-02-23 ENCOUNTER — Encounter (INDEPENDENT_AMBULATORY_CARE_PROVIDER_SITE_OTHER): Payer: Medicare PPO | Admitting: Ophthalmology

## 2018-02-23 DIAGNOSIS — H43813 Vitreous degeneration, bilateral: Secondary | ICD-10-CM

## 2018-02-23 DIAGNOSIS — I10 Essential (primary) hypertension: Secondary | ICD-10-CM

## 2018-02-23 DIAGNOSIS — H353231 Exudative age-related macular degeneration, bilateral, with active choroidal neovascularization: Secondary | ICD-10-CM | POA: Diagnosis not present

## 2018-02-23 DIAGNOSIS — H35033 Hypertensive retinopathy, bilateral: Secondary | ICD-10-CM

## 2018-02-27 ENCOUNTER — Encounter: Payer: Medicare PPO | Admitting: Physician Assistant

## 2018-02-27 DIAGNOSIS — L97519 Non-pressure chronic ulcer of other part of right foot with unspecified severity: Secondary | ICD-10-CM | POA: Diagnosis not present

## 2018-02-28 ENCOUNTER — Other Ambulatory Visit: Payer: Self-pay | Admitting: *Deleted

## 2018-02-28 DIAGNOSIS — I739 Peripheral vascular disease, unspecified: Secondary | ICD-10-CM

## 2018-02-28 NOTE — Progress Notes (Signed)
[name], [name] ([id number]) Visit Report for 02/27/2018 Chief Complaint Document Details Patient Name: [name], [name] Date of Service: 02/27/2018 11:00 AM Medical Record Number: [id number] Patient Account Number: [id number] Date of Birth/Sex: 30-Jul-1919 (84 y.o. F) Treating RN: Cornell Barman Primary Care Provider: PATIENT, NO Other Clinician: Referring Provider: Referral, Self Treating Provider/Extender: Melburn Hake, HOYT Weeks in Treatment: 18 Information Obtained from: Patient Chief Complaint Right foot Ulcers Electronic Signature(s) Signed: 02/27/2018 6:53:41 PM By: Worthy Keeler PA-C Entered By: Worthy Keeler on 02/27/2018 10:57:48 [name] ([id number]) -------------------------------------------------------------------------------- HPI Details Patient Name: [name] Date of Service: 02/27/2018 11:00 AM Medical Record Number: [id number] Patient Account Number: [id number] Date of Birth/Sex: Aug 26, 1919 (84 y.o. F) Treating RN: Cornell Barman Primary Care Provider: PATIENT, NO Other Clinician: Referring Provider: Referral, Self Treating Provider/Extender: Melburn Hake, HOYT Weeks in Treatment: 18 History of Present Illness HPI Description: 04/06/16; this is a 84 year old woman who arrives accompanied by 2 daughters for a wound on her left ankle and her left fifth toe. These have apparently been present for a year. I'm not quite certain how she came to this clinic however she was being followed by Sharlotte Alamo her podiatrist for these wounds. She was also referred to [facility] and they apparently did a test presumably arterial studies although we don't have any of these results and we couldn't get through to the office today. The family but has been applying a combination of Bactroban and a light bandage and perhaps more recently Silvadene cream. She did have an x-ray of the foot roughly 6 months ago at the podiatry office the family was unaware that if there  were any abnormalities. Apparently they have not seen any healing here. Our intake nurse noted a slight skin tear on the right anterior lower leg. Nobody seemed aware of this. ABIs calculated in this clinic was 0.3 on the right and 0.4 on the left I have reviewed things in [facility]. There is very little information on this patient. She apparently follows in current [facility] clinic which we don't have information from. She has mentioned already been to a [facility]. She has a history of hypothyroidism, nephrolithiasis arthritis and has had a previous mastectomy. 04/13/16; patient's x-ray was normal. She is already been to see Dr. Delana Meyer vascular surgery. Her arterial exam was from November 2016 this showed a left ABI of 0.62 her right of 0.76. Her duplex ultrasound of the left leg showed biphasic waves in the common femoral and distal femoral artery however monophasic waves in the superficial femoral artery proximal and mid biphasic it distal. 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 The patient tells me that she has pain at night when she tries to lie down which is improved by getting up and sitting in the chair this sounds like claudication at rest. Her wounds are on the left medial malleolus and the dorsal fifth toe small punched out wounds that are right on bone. We use Santyl last week 04/20/16: nurse informed me pt has declined evaluation for significant PAD. she denies systemic s/s of infections. 04/27/16;; the patient has had noninvasive arterial studies done in November 2016. ABI and the left was 0.62. Monophasic waves at the superficial femoral artery. Occluded to the posterior tibial artery. So had greater than 50% stenosis of the right superficial femoral and greater than 50% stenosis of the left superficial femoral artery. She had bilateral tibial peroneal artery disease. Both of the wounds on the left fifth toe and left lateral malleolus have  been present for more than a year. They  have been to see vein and vascular. The patient has some pain but miraculously I think the wounds have largely been stable. No evidence of infection 05/04/16; she goes for a noninvasive study tomorrow and then sees Dr. Fletcher Anon on Monday. By the time she is here next week we should have a better picture of whether something can be done with regards to her arterial flow. We continue to have ischemic-looking wounds on the left fifth toe and left lateral malleolus. 05/10/16; the patient went for her arterial studies and saw Dr. Fletcher Anon. As predicted she is felt to have critical limb ischemia. The feeling is that she has occlusion of the SFA. The feeling would she would be a candidate for a stent to the SFA. The patient did not make a decision to proceed with the procedure and she is here with family members to discuss this me today. He shouldn't has a lot of pain and cannot sleep and rest well at night per her family. 05/24/16; the patient went and had a complex revascularization/angioplasty of the left superficial femoral artery followed by drug-coated balloon angioplasty and spots stenting. She tolerated the procedure well. She was recommended for dual antiplatelet drugs with Plavix and aspirin for at least a month. She went yesterday for I believe follow-up serial Dopplers and ABIs although I don't see these results. The patient is unfortunately complaining of a lot of pain in her bilateral lower legs below the knees from the ankle to the knees. She apparently was prescribed lidocaine and apparently put this on her legs instead of over the wound areas. This may have something to do with it however there is a lot of edema in her bilateral legs I was able to find her arterial studies from yesterday. The left ABI has improved up to 0.66 post left SFA stent. The bilateral great toe indices remain abnormal with the left being in the 0.25 range. Duplex ultrasound showed her left SFA stent is patent monophasic  waveforms persist in the left leg 06/07/16; continued punched-out areas over the dorsal left fifth toe and left lateral malleolus. No major improvement 06/28/16; the areas over her dorsal left fifth toe and left lateral malleolus are covered in surface slough we are using Iodoflex 07/05/16. We have been using Iodoflex for 2-3 weeks now. I have not been debridement is because of pain [name], [name] ([id number]) 07/19/16 currently we have been using Iodoflex for roughly the past month. Previously we were unable to debride due to pain although today patient states that the pain is not nearly as severe as it has been in the past. In fact she rates this to be a 1 out of 10 and it worse to a to 10 with palpation of the wound. All and all her and her daughter feel like this is actually doing steadily better at this point in time. She is pleased with progress and we have been seeing her every 2 weeks. 08/02/16; this is a delightful 84 year old woman I have not seen in over a month. She has arterial insufficiency wounds remaining over the left lateral malleolus and the left fifth toe. With the help of Dr. Fletcher Anon we are able to get her revascularized on the left. The 2 wounds on the left have been making good progress and are definitely smaller especially the area over the left lateral malleolus. She is certainly in a lot less pain than she used to be although I still  think there is some claudication type pain. Unfortunately she is developed a area on the right lateral foot which is a small open area but I think is threatened. I suspect a small ischemic areas well. Also on her right fifth toe there is an area that is not open but looks as though it is receiving too much pressure from footwear. Finally an area over her right malleolus although I don't think this is on its way to anything ominous 08/17/16 patient presents today for follow up evaluation she tells me that she is really doing fairly well from a pain  standpoint compared to where she has been previous. She is tolerating the dressing changes without any complication. She continues to have discharge and drainage however. 08-30-16 Ms. Sassone presents today with her daughter, she states that she continues to have intermittent shooting pains to the left lateral fifth toe at this site of seems to be a healed ulcer. She denies any other issues that her wound related since her last appointment. Her daughter states that she is in need of a tramadol refill at this time as she uses half a tablet at at bedtime to aid in sleep due to foot pain. Iodoflex has been used on all wounds in previous dressing changes. 09/13/16; the patient has ischemic wounds in her feet. The area over the left lateral malleolus is healed and the area over the left fifth toe looks improved.. She has an area on the lateral aspect of her right foot which is a small but deep wound. Finally she has a new open area on the medial aspect of the left medial malleolus. This is superficial 09/27/16; the area over the left lateral malleolus remains healed. The area over the left fifth toe has a surface and she states the pain is better but I don't think this is completely closed. She has an area on the lateral aspect of her right foot is a small but deep wound. The new open area on the medial aspect of the left ankle was closed from last time. 10/18/16; the area over her left lateral malleolus remains healed. The area over the left fifth toe has a surface over the top of this however there is no overt open area. Given the underlying issues of severe PAD and continued pain in the toe I would think it would be unlikely this is truly healed however I am not planning to debridement this area. The area on the right lateral foot which was a more recent wound is a small punched out painful area again has significant surface slough and nonviable tissue. It is clear the patient still has claudication  type pain however she remains functional. I have been giving her tramadol when necessary and that seems to help a lot with her pain the patient follows up with Dr. Fletcher Anon on January 18/18 11/01/16; patient missed her follow-up with Dr. Fletcher Anon last week due to a snow day. Follow-up is now on February 15. The areas on the right lateral malleolus and dorsal right fifth toe remain closed over. The fifth toe was tentative is there is a surface eschar however I'm not going to disturb this. Therefore, her only open areas on the right lateral foot. This is a small punched out area. We have been using Prisma. I'm quite convinced this is an ischemic wound 11/15/16; patient has follow-up with Dr. Fletcher Anon on February 15. She has no open wound on the left leg and doesn't really complain of claudication that  I can determine from talking to her. However on the right leg she clearly has some degree of claudication. The remaining open wound is on the right lateral foot. We have been using Iodosorb ointment 11/29/16; patient saw Dr. Fletcher Anon on 11/24/16. His comment is that her wound on her right lateral foot seems to be improving with local wound care. She has known significant right SFA disease. Her lower extremity Doppler will be repeated and according to the patient's daughter that appointment is on March 15. He is left with the thought that endovascular intervention in the right SFA might be necessary. The patient has quite a bit of pain especially at night related to the wound in her right foot. We changed her to Iodosorb ointment last week 12/13/16; this is a patient I follow every 2 weeks largely on a palliative approach at this point about ischemic wounds currently in the right foot. Initially she had them on her left lateral malleolus and left fifth toe. The area on the left lateral malleolus healed and the fifth toe has a surface eschar that I have elected not to remove. Both of these improved after revascularization by  Dr. Fletcher Anon. We have been using Iodosorb to the right lateral foot not much change here. 12/27/16; the patient had her arterial studies. This showed known bilateral SFA disease. Stable right ABI 0.5 to stable left ABI at 0.7 to the did not do it TBI on the right it was 0.61 on the left she has a stent in the long segment of her left SFA from 05/18/16. All of her wounds are somewhat better. She states her pain is better in the right foot is improved. 01/10/17- patient is here for follow-up dilation of her right lateral foot ulcer. She has been tolerating Iodosorb. She is voices no complaints or concerns 01/24/17; small ischemic wound on right lateral foot. still non viable cover. follows with Dr. Fletcher Anon on 4/30. No open area on left foot [name], [name] ([id number]) 02/07/17 doing well and states she is pain free. Saw Dr. Fletcher Anon who said she is doing well and has "50% flow in the right leg and 70% in the left. According to her daughter he does not wish to do any further interventions 02/21/17; small ischemic wound on the right lateral foot. Per her daughter and the patient she has no open wound on the left foot and ankle which were her initial presenting wounds. still has claudication type pain at night she is been to see interventional cardiology who does not feel that she has a need for intervention on the right leg at present. She still has claudication type pain in the right leg which she manages at night with when necessary tramadol that I have prescribed 03/14/17; small ischemic wound on the right lateral foot. They've been dressing this at home with Iodosorb ointment. The patient does not complain of any pain. The wound has a surface eschar over it and there is really no visible open area. This is similar to how the areas on the left leg healed 04/11/17; the small ischemic wound on her right lateral foot is finally closed over. Small amount of eschar over the surface however I did not attempt to remove  this. The patient claims to be asymptomatic she is not having any claudication that I'm able to elicit although her activity is limited Readmission: 10/23/17 on evaluation today patient appears to be doing somewhat poorly in regard to her right foot where she has two ulcers at  this point. The worst is on the lateral aspect of her foot medial first metatarsal region is not nearly as bad. With that being said these did arise seemingly out of nowhere she has previously had a similar issue in the past these have always been ischemic wounds due to arterial insufficiency. She does see Dr. Fletcher Anon as her vascular specialist and it has been noted that her ABI's are somewhat low. She actually has a repeat evaluation coming up this March 2019 to reevaluate her blood flow. On the last check 12/22/16 it was noted that patient had stable ABI's in the lower moderate abnormal range in regard to the right in regard to the left and the upper moderate at normal range. Patient is having some discomfort though nothing severe at this point in time. She is seen with her daughter and son-in-law today. No fevers, chills, nausea, or vomiting noted at this time. 10/30/17 on evaluation today patient appears to be doing well in regard to her wounds. The right lateral foot wound appears to show signs of cleaning up nicely there is granulation noted underneath that the bed of the wound although there still some [facility] covering. Nonetheless she still has a lot of discomfort and I really do not want to proceed with debridement due to the fact that she does have so much discomfort. Good news is the tramadol has been helping her at bedtime she only takes this once a day and that has been extremely beneficial. Unfortunately she does not have a primary right now as she is awaiting a new one which is the reason that I am prescribing the tramadol. Nonetheless I'm pleased with how things seem to be progressing in regard to her  ulcers. 11/06/17 on evaluation today patient appears to be doing decently well as far as maintaining in regard to her ulcers. Unfortunately I did review her arterial study which shows that she has on the right arterial obstruction involving the superficial femoral and popliteal artery as well is the superficial femoral artery with a high grade stenosis versus inclusion. Collateral flow noted in the distal portion of the SFA. Severe progression is noted compared to previous study. Unfortunately being the patient's wounds do not seem to be healing as appropriately and quickly as we would like I do believe this is likely a result of poor vascular flow to the right lower extremity. This was discussed with patient and her daughter during the office visit today. 11/14/17 on evaluation today patient continues to have a fairly stalled ulcer in regard to her to ulcers on the right lower extremity. She obviously did have significant findings on her arterial study and I do believe this is again due to poor vascular flow. She does have an appointment later this afternoon with her vascular specialist for further evaluation to see if there any recommendations at that point. With that being said she continues to have discomfort which I do believe is arterial in nature in regard to insufficiency. 11/21/17 on evaluation today patient appears to be doing fairly well in regard to the appearance of her wounds other than the fact that she does have erythema surrounding the wound bed at this point in fact this encompasses the majority of the dorsal surface of her right foot. There is additional granulation noted today that was not noted previously on evaluation and again if that were alone were the findings that we were seeing I would be very happy with the current progress. However unfortunately she is having  the erythema which concerns me for the possibility of infection although the other possibility is that she could  just be having a local reaction due to the I resort. In the past she never had this issue however when she use this for the root left foot which makes me again come back to my concern about infection. 11/28/17 on evaluation today patient is status post having had an angiogram performed on 11/22/17 where it was noted that she had severe disease involving the TP trunk and proximal peroneal artery. Subsequently she underwent successful atherectomy and drug coated balloon angioplasty to the right SFA, TP trunk, and proximal peroneal artery. Patient stated only of has 10% residual stenosis and overall the procedure at the time was both tolerated well as well as considered a success. This was performed by Dr. Sophronia Simas. Patient was subsequently discharged home on 11/23/17 from the hospital. Overall she states that her leg pain is not nearly as severe as what it was previous which is excellent news. She also has been sleeping with her [name], [name] ([id number]) leg in the bed previously she was hanging this over the edge due to the pain. She also seems to have much better blood flow on evaluation today. 12/05/17 on evaluation today patient appears to be doing very well in regard to her quite lateral great toe as well as the right lateral foot ulcer. She has excellent blood flow and great capillary refill noted on evaluation today which is excellent news. With that being said she does have discomfort but mainly at the site of the wounds especially lateral foot wound but nothing like what she was experiencing previously. This is rated to be a 3/10 at most. She is seen with her daughter present. 12/12/17 on evaluation today patient appears to be doing very well in regard to her right foot and right medial great toe ulcers. She has been tolerating the dressing changes without complication. She doesn't seem to have any significant discomfort which is also good news. Overall she has been doing excellent since her  vascular intervention in my opinion. She actually does have follow-up later today with vascular for a repeat arterial study to ensure everything seems to be doing well following her surgery. 12/19/17 on evaluation today patient appears to be doing excellent in regard to her lower extremity ulcers. In fact the right medial great toe ulcer has completely resolved at this point which is excellent news. The right lateral foot ulcer seems to be showing signs of improving week by week and obviously this is good news as well. Overall I'm very pleased with the progress that she has made up to this point. She does have some Slough covering the lateral foot wound which does require sharp debridement today. I did review her vascular note from Dr. Dellis Filbert and it appears that everything seems to be doing well from a vascular standpoint and the patient's foot continues to be warm and appears well vascularized. 01/02/18 on evaluation today patient appears to be doing well in regard to her right lateral foot ulcer. She has been tolerating the dressing changes without complication. There does not appear to be any evidence of infection at this time which is also good news. She has definite new granulation noted. 01/16/18 on evaluation today patient appears to be doing very well. She has been tolerating the dressing changes without complication she has good epithelialization noted at this point. There is no evidence of infection. Overall I'm extremely happy with the progress she  seems to be making. 01/30/18 on evaluation today patient presents for follow-up concerning her right lateral foot ulcer. She has been tolerating the dressing changes without complication. There does not appear to be any evidence of infection which is good news. Overall I am definitely pleased with the progress she seems to have made. Patient's daughter is likewise also happy. Nonetheless she is having some issues with nighttime pain previously I had  prescribed for her tramadol although she has not had this recently she has been out. This seems to be one reason why her pain at night has been a little worse. Nonetheless fortunately taking one tramadol at bedtime seems to help with this and she's able to sleep much better. 02/13/18 on evaluation today patient appears to be doing very well in regard to her right lateral foot ulcer. She is making progress albeit slow in regard to the healing of this wound she still seems to have a warm foot and toes which is great news. Obviously in the beginning she had a lot of vascular issues. Overall I'm pleased. 02/27/18 on evaluation today patient appears to be doing very well in regard to her right lateral foot ulcer. Patient's foot does not seem to have any pain she states that's excellent in that regard. Unfortunately it's just taking a very long time to heal but fortunately is making good progress. Electronic Signature(s) Signed: 02/27/2018 6:53:41 PM By: Worthy Keeler PA-C Entered By: Worthy Keeler on 02/27/2018 12:57:26 [name] ([id number]) -------------------------------------------------------------------------------- Physical Exam Details Patient Name: [name] Date of Service: 02/27/2018 11:00 AM Medical Record Number: [id number] Patient Account Number: [id number] Date of Birth/Sex: [birth year] (84 y.o. F) Treating RN: Cornell Barman Primary Care Provider: PATIENT, NO Other Clinician: Referring Provider: Referral, Self Treating Provider/Extender: STONE III, HOYT Weeks in Treatment: 103 Constitutional Well-nourished and well-hydrated in no acute distress. Respiratory normal breathing without difficulty. clear to auscultation bilaterally. Cardiovascular regular rate and rhythm with normal S1, S2. Psychiatric this patient is able to make decisions and demonstrates good insight into disease process. Alert and Oriented x 3. pleasant and cooperative. Notes Patient's wound did not  have any significant adherent slough noted on the surface of the wound she is a good granular bed overall this appears to be doing excellent it's just taking it's time as far as healing is concerned. Electronic Signature(s) Signed: 02/27/2018 6:53:41 PM By: Worthy Keeler PA-C Entered By: Worthy Keeler on 02/27/2018 12:58:01 [name] ([id number]) -------------------------------------------------------------------------------- Physician Orders Details Patient Name: [name] Date of Service: 02/27/2018 11:00 AM Medical Record Number: [id number] Patient Account Number: [id number] Date of Birth/Sex: 1918/10/13 (84 y.o. F) Treating RN: Cornell Barman Primary Care Provider: PATIENT, NO Other Clinician: Referring Provider: Referral, Self Treating Provider/Extender: STONE III, HOYT Weeks in Treatment: 18 Verbal / Phone Orders: No Diagnosis Coding ICD-10 Coding Code Description I70.235 Atherosclerosis of native arteries of right leg with ulceration of other part of foot I70.232 Atherosclerosis of native arteries of right leg with ulceration of calf L97.512 Non-pressure chronic ulcer of other part of right foot with fat layer exposed I10 Essential (primary) hypertension Wound Cleansing Wound #7 Right,Dorsal Foot o Clean wound with Normal Saline. o Cleanse wound with mild soap and water o May Shower, gently pat wound dry prior to applying new dressing. Anesthetic (add to Medication List) Wound #7 Right,Dorsal Foot o Topical Lidocaine 4% cream applied to wound bed prior to debridement (In Clinic Only). o Benzocaine Topical Anesthetic Spray applied to wound bed prior to debridement (  In Clinic Only). Skin Barriers/Peri-Wound Care Wound #7 Right,Dorsal Foot o Skin Prep Primary Wound Dressing Wound #7 Right,Dorsal Foot o Silver Collagen - prisma ag Secondary Dressing Wound #7 Right,Dorsal Foot o Dry Gauze o Tegaderm Dressing Change Frequency Wound #7  Right,Dorsal Foot o Change dressing every day. Follow-up Appointments Wound #7 Right,Dorsal Foot o Return Appointment in 2 weeks. Edema Control Jaroszewski, Cherrish ([id number]) Wound #7 Right,Dorsal Foot o Elevate legs to the level of the heart and pump ankles as often as possible Off-Loading Wound #7 Right,Dorsal Foot o Turn and reposition every 2 hours Additional Orders / Instructions Wound #7 Right,Dorsal Foot o Increase protein intake. o Other: - Vitamin C, Zinc Electronic Signature(s) Signed: 02/27/2018 5:25:47 PM By: Gretta Cool, BSN, RN, CWS, Kim RN, BSN Signed: 02/27/2018 6:53:41 PM By: Worthy Keeler PA-C Entered By: Gretta Cool BSN, RN, CWS, Kim on 02/27/2018 11:35:34 [name] ([id number]) -------------------------------------------------------------------------------- Problem List Details Patient Name: [name] Date of Service: 02/27/2018 11:00 AM Medical Record Number: [id number] Patient Account Number: [id number] Date of Birth/Sex: August 15, 1919 (84 y.o. 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 F) Treating RN: Cornell Barman Primary Care Provider: PATIENT, NO Other Clinician: Referring Provider: Referral, Self Treating Provider/Extender: Melburn Hake, HOYT Weeks in Treatment: 18 Subjective Chief Complaint Information obtained from Patient Right foot Ulcers History of Present Illness (HPI) 04/06/16; this is a 84 year old woman who arrives accompanied by 2 daughters for a wound on her left ankle and her left fifth toe. These have apparently been present for a year. I'm not quite certain how she came to this clinic however she was being followed by Sharlotte Alamo her podiatrist for these wounds. She was also referred to [facility] and they apparently did a test presumably arterial studies although we don't have any of these results and we couldn't get through to the office today. The family but has been applying a combination of Bactroban and a light bandage and perhaps more recently Silvadene cream. She did have an x-ray of the foot roughly 6 months ago at the podiatry office the family was unaware that if there were any abnormalities. Apparently they have not seen any healing here. Our intake nurse noted a slight skin tear on the right anterior lower leg. Nobody seemed aware of this. ABIs calculated in this clinic was 0.3 on the right and 0.4 on the left I have reviewed things in [facility]. There is very little information on this patient. She apparently follows in current total clinic which we don't have information from. She has mentioned already been to a [facility]. She has a history of hypothyroidism, nephrolithiasis arthritis and has had a previous mastectomy. 04/13/16; patient's x-ray  was normal.  She is already been to see Dr. Delana Meyer vascular surgery. Her arterial exam was from November 2016 this showed a left ABI of 0.62 her right of 0.76. Her duplex ultrasound of the left leg showed biphasic waves in the common femoral and distal femoral artery however monophasic waves in the superficial femoral artery proximal and mid biphasic it distal. 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 The patient tells me that she has pain at night when she tries to lie down which is improved by getting up and sitting in the chair this sounds like claudication at rest. Her wounds are on the left medial malleolus and the dorsal fifth toe small punched out wounds that are right on bone. We use Santyl last week 04/20/16: nurse informed me pt has declined evaluation for significant PAD. she denies systemic s/s of infections. 04/27/16;; the patient has had noninvasive arterial studies done in November 2016. ABI and the left was 0.62. Monophasic waves at the superficial femoral artery. Occluded to the posterior tibial artery. So had greater than 50% stenosis of the right superficial femoral and greater than 50% stenosis of the left superficial femoral artery. She had bilateral tibial peroneal artery disease. Both of the wounds on the left fifth toe and left lateral malleolus have been present for more than a year. They have been to see vein and vascular. The patient has some pain but miraculously I think the wounds have largely been stable. No evidence of infection 05/04/16; she goes for a noninvasive study tomorrow and then sees Dr. Fletcher Anon on Monday. By the time she is here next week we should have a better picture of whether something can be done with regards to her arterial flow. We continue to have ischemic-looking wounds on the left fifth toe and left lateral malleolus. 05/10/16; the patient went for her arterial studies and saw Dr. Fletcher Anon. As predicted she is felt to have critical limb ischemia.  The feeling is that she has occlusion of the SFA. The feeling would she would be a candidate for a stent to the SFA. The patient did not make a decision to proceed with the procedure and she is here with family members to discuss this me today. He shouldn't has a lot of pain and cannot sleep and rest well at night per her family. 05/24/16; the patient went and had a complex revascularization/angioplasty of the left superficial femoral artery followed by drug-coated balloon angioplasty and spots stenting. She tolerated the procedure well. She was recommended for dual antiplatelet drugs with Plavix and aspirin for at least a month. She went yesterday for I believe follow-up serial Dopplers and ABIs although I don't see these results. The patient is unfortunately complaining of a lot of pain in her bilateral lower legs below the knees from the ankle to the knees. She apparently was prescribed lidocaine and apparently put this on her legs instead of over the wound areas. This may have something to do with it however there is a lot of edema in her bilateral legs I was able to find her arterial studies from yesterday. The left ABI has improved up to 0.66 post left SFA stent. The bilateral Pharr, Myra ([id number]) great toe indices remain abnormal with the left being in the 0.25 range. Duplex ultrasound showed her left SFA stent is patent monophasic waveforms persist in the left leg 06/07/16; continued punched-out areas over the dorsal left fifth toe and left lateral malleolus. No major improvement 06/28/16; the areas over her dorsal left fifth toe  and left lateral malleolus are covered in surface slough we are using Iodoflex 07/05/16. We have been using Iodoflex for 2-3 weeks now. I have not been debridement is because of pain 07/19/16 currently we have been using Iodoflex for roughly the past month. Previously we were unable to debride due to pain although today patient states that the pain is not  nearly as severe as it has been in the past. In fact she rates this to be a 1 out of 10 and it worse to a to 10 with palpation of the wound. All and all her and her daughter feel like this is actually doing steadily better at this point in time. She is pleased with progress and we have been seeing her every 2 weeks. 08/02/16; this is a delightful 84 year old woman I have not seen in over a month. She has arterial insufficiency wounds remaining over the left lateral malleolus and the left fifth toe. With the help of Dr. Fletcher Anon we are able to get her revascularized on the left. The 2 wounds on the left have been making good progress and are definitely smaller especially the area over the left lateral malleolus. She is certainly in a lot less pain than she used to be although I still think there is some claudication type pain. Unfortunately she is developed a area on the right lateral foot which is a small open area but I think is threatened. I suspect a small ischemic areas well. Also on her right fifth toe there is an area that is not open but looks as though it is receiving too much pressure from footwear. Finally an area over her right malleolus although I don't think this is on its way to anything ominous 08/17/16 patient presents today for follow up evaluation she tells me that she is really doing fairly well from a pain standpoint compared to where she has been previous. She is tolerating the dressing changes without any complication. She continues to have discharge and drainage however. 08-30-16 Ms. Kabler presents today with her daughter, she states that she continues to have intermittent shooting pains to the left lateral fifth toe at this site of seems to be a healed ulcer. She denies any other issues that her wound related since her last appointment. Her daughter states that she is in need of a tramadol refill at this time as she uses half a tablet at at bedtime to aid in sleep due to foot  pain. Iodoflex has been used on all wounds in previous dressing changes. 09/13/16; the patient has ischemic wounds in her feet. The area over the left lateral malleolus is healed and the area over the left fifth toe looks improved.. She has an area on the lateral aspect of her right foot which is a small but deep wound. Finally she has a new open area on the medial aspect of the left medial malleolus. This is superficial 09/27/16; the area over the left lateral malleolus remains healed. The area over the left fifth toe has a surface and she states the pain is better but I don't think this is completely closed. She has an area on the lateral aspect of her right foot is a small but deep wound. The new open area on the medial aspect of the left ankle was closed from last time. 10/18/16; the area over her left lateral malleolus remains healed. The area over the left fifth toe has a surface over the top of this however there is no  overt open area. Given the underlying issues of severe PAD and continued pain in the toe I would think it would be unlikely this is truly healed however I am not planning to debridement this area. The area on the right lateral foot which was a more recent wound is a small punched out painful area again has significant surface slough and nonviable tissue. It is clear the patient still has claudication type pain however she remains functional. I have been giving her tramadol when necessary and that seems to help a lot with her pain the patient follows up with Dr. Fletcher Anon on January 18/18 11/01/16; patient missed her follow-up with Dr. Fletcher Anon last week due to a snow day. Follow-up is now on February 15. The areas on the right lateral malleolus and dorsal right fifth toe remain closed over. The fifth toe was tentative is there is a surface eschar however I'm not going to disturb this. Therefore, her only open areas on the right lateral foot. This is a small punched out area. We have been  using Prisma. I'm quite convinced this is an ischemic wound 11/15/16; patient has follow-up with Dr. Fletcher Anon on February 15. She has no open wound on the left leg and doesn't really complain of claudication that I can determine from talking to her. However on the right leg she clearly has some degree of claudication. The remaining open wound is on the right lateral foot. We have been using Iodosorb ointment 11/29/16; patient saw Dr. Fletcher Anon on 11/24/16. His comment is that her wound on her right lateral foot seems to be improving with local wound care. She has known significant right SFA disease. Her lower extremity Doppler will be repeated and according to the patient's daughter that appointment is on March 15. He is left with the thought that endovascular intervention in the right SFA might be necessary. The patient has quite a bit of pain especially at night related to the wound in her right foot. We changed her to Iodosorb ointment last week 12/13/16; this is a patient I follow every 2 weeks largely on a palliative approach at this point about ischemic wounds currently in the right foot. Initially she had them on her left lateral malleolus and left fifth toe. The area on the left lateral malleolus healed and the fifth toe has a surface eschar that I have elected not to remove. Both of these improved after revascularization by Dr. Fletcher Anon. We have been using Iodosorb to the right lateral foot not much change here. 12/27/16; the patient had her arterial studies. This showed known bilateral SFA disease. Stable right ABI 0.5 to stable left ABI at 0.7 to the did not do it TBI on the right it was 0.61 on the left she has a stent in the long segment of her left SFA from [place], [name] ([id number]) 05/18/16. All of her wounds are somewhat better. She states her pain is better in the right foot is improved. 01/10/17- patient is here for follow-up dilation of her right lateral foot ulcer. She has been tolerating  Iodosorb. She is voices no complaints or concerns 01/24/17; small ischemic wound on right lateral foot. still non viable cover. follows with Dr. Fletcher Anon on 4/30. No open area on left foot 02/07/17 doing well and states she is pain free. Saw Dr. Fletcher Anon who said she is doing well and has "50% flow in the right leg and 70% in the left. According to her daughter he does not wish to do any further  interventions 02/21/17; small ischemic wound on the right lateral foot. Per her daughter and the patient she has no open wound on the left foot and ankle which were her initial presenting wounds. still has claudication type pain at night she is been to see interventional cardiology who does not feel that she has a need for intervention on the right leg at present. She still has claudication type pain in the right leg which she manages at night with when necessary tramadol that I have prescribed 03/14/17; small ischemic wound on the right lateral foot. They've been dressing this at home with Iodosorb ointment. The patient does not complain of any pain. The wound has a surface eschar over it and there is really no visible open area. This is similar to how the areas on the left leg healed 04/11/17; the small ischemic wound on her right lateral foot is finally closed over. Small amount of eschar over the surface however I did not attempt to remove this. The patient claims to be asymptomatic she is not having any claudication that I'm able to elicit although her activity is limited Readmission: 10/23/17 on evaluation today patient appears to be doing somewhat poorly in regard to her right foot where she has two ulcers at this point. The worst is on the lateral aspect of her foot medial first metatarsal region is not nearly as bad. With that being said these did arise seemingly out of nowhere she has previously had a similar issue in the past these have always been ischemic wounds due to arterial insufficiency. She does see  Dr. Fletcher Anon as her vascular specialist and it has been noted that her ABI's are somewhat low. She actually has a repeat evaluation coming up this March 2019 to reevaluate her blood flow. On the last check 12/22/16 it was noted that patient had stable ABI's in the lower moderate abnormal range in regard to the right in regard to the left and the upper moderate at normal range. Patient is having some discomfort though nothing severe at this point in time. She is seen with her daughter and son-in-law today. No fevers, chills, nausea, or vomiting noted at this time. 10/30/17 on evaluation today patient appears to be doing well in regard to her wounds. The right lateral foot wound appears to show signs of cleaning up nicely there is granulation noted underneath that the bed of the wound although there still some [place] covering. Nonetheless she still has a lot of discomfort and I really do not want to proceed with debridement due to the fact that she does have so much discomfort. Good news is the tramadol has been helping her at bedtime she only takes this once a day and that has been extremely beneficial. Unfortunately she does not have a primary right now as she is awaiting a new one which is the reason that I am prescribing the tramadol. Nonetheless I'm pleased with how things seem to be progressing in regard to her ulcers. 11/06/17 on evaluation today patient appears to be doing decently well as far as maintaining in regard to her ulcers. Unfortunately I did review her arterial study which shows that she has on the right arterial obstruction involving the superficial femoral and popliteal artery as well is the superficial femoral artery with a high grade stenosis versus inclusion. Collateral flow noted in the distal portion of the SFA. Severe progression is noted compared to previous study. Unfortunately being the patient's wounds do not seem to be healing as  appropriately and quickly as we would like I do  believe this is likely a result of poor vascular flow to the right lower extremity. This was discussed with patient and her daughter during the office visit today. 11/14/17 on evaluation today patient continues to have a fairly stalled ulcer in regard to her to ulcers on the right lower extremity. She obviously did have significant findings on her arterial study and I do believe this is again due to poor vascular flow. She does have an appointment later this afternoon with her vascular specialist for further evaluation to see if there any recommendations at that point. With that being said she continues to have discomfort which I do believe is arterial in nature in regard to insufficiency. 11/21/17 on evaluation today patient appears to be doing fairly well in regard to the appearance of her wounds other than the fact that she does have erythema surrounding the wound bed at this point in fact this encompasses the majority of the dorsal surface of her right foot. There is additional granulation noted today that was not noted previously on evaluation and again if that were alone were the findings that we were seeing I would be very happy with the current progress. However unfortunately she is having the erythema which concerns me for the possibility of infection although the other possibility is that she could just be having a local reaction due to the I resort. In the past she never had this issue however when she use this for the root left foot which makes me again come back to my concern about infection. [name], [name] ([id number]) 11/28/17 on evaluation today patient is status post having had an angiogram performed on 11/22/17 where it was noted that she had severe disease involving the TP trunk and proximal peroneal artery. Subsequently she underwent successful atherectomy and drug coated balloon angioplasty to the right SFA, TP trunk, and proximal peroneal artery. Patient stated only of has 10%  residual stenosis and overall the procedure at the time was both tolerated well as well as considered a success. This was performed by Dr. Sophronia Simas. Patient was subsequently discharged home on 11/23/17 from the hospital. Overall she states that her leg pain is not nearly as severe as what it was previous which is excellent news. She also has been sleeping with her leg in the bed previously she was hanging this over the edge due to the pain. She also seems to have much better blood flow on evaluation today. 12/05/17 on evaluation today patient appears to be doing very well in regard to her quite lateral great toe as well as the right lateral foot ulcer. She has excellent blood flow and great capillary refill noted on evaluation today which is excellent news. With that being said she does have discomfort but mainly at the site of the wounds especially lateral foot wound but nothing like what she was experiencing previously. This is rated to be a 3/10 at most. She is seen with her daughter present. 12/12/17 on evaluation today patient appears to be doing very well in regard to her right foot and right medial great toe ulcers. She has been tolerating the dressing changes without complication. She doesn't seem to have any significant discomfort which is also good news. Overall she has been doing excellent since her vascular intervention in my opinion. She actually does have follow-up later today with vascular for a repeat arterial study to ensure everything seems to be doing well following her surgery. 12/19/17  on evaluation today patient appears to be doing excellent in regard to her lower extremity ulcers. In fact the right medial great toe ulcer has completely resolved at this point which is excellent news. The right lateral foot ulcer seems to be showing signs of improving week by week and obviously this is good news as well. Overall I'm very pleased with the progress that she has made up to this point. She  does have some Sl[place] covering the lateral foot wound which does require sharp debridement today. I did review her vascular note from Dr. Dellis Filbert and it appears that everything seems to be doing well from a vascular standpoint and the patient's foot continues to be warm and appears well vascularized. 01/02/18 on evaluation today patient appears to be doing well in regard to her right lateral foot ulcer. She has been tolerating the dressing changes without complication. There does not appear to be any evidence of infection at this time which is also good news. She has definite new granulation noted. 01/16/18 on evaluation today patient appears to be doing very well. She has been tolerating the dressing changes without complication she has good epithelialization noted at this point. There is no evidence of infection. Overall I'm extremely happy with the progress she seems to be making. 01/30/18 on evaluation today patient presents for follow-up concerning her right lateral foot ulcer. She has been tolerating the dressing changes without complication. There does not appear to be any evidence of infection which is good news. Overall I am definitely pleased with the progress she seems to have made. Patient's daughter is likewise also happy. Nonetheless she is having some issues with nighttime pain previously I had prescribed for her tramadol although she has not had this recently she has been out. This seems to be one reason why her pain at night has been a little worse. Nonetheless fortunately taking one tramadol at bedtime seems to help with this and she's able to sleep much better. 02/13/18 on evaluation today patient appears to be doing very well in regard to her right lateral foot ulcer. She is making progress albeit slow in regard to the healing of this wound she still seems to have a warm foot and toes which is great news. Obviously in the beginning she had a lot of vascular issues. Overall I'm  pleased. 02/27/18 on evaluation today patient appears to be doing very well in regard to her right lateral foot ulcer. Patient's foot does not seem to have any pain she states that's excellent in that regard. Unfortunately it's just taking a very long time to heal but fortunately is making good progress. Patient History Information obtained from Patient. Family History Diabetes - Mother, Heart Disease - Siblings, Stroke - Siblings, No family history of Cancer, Hereditary Spherocytosis, Hypertension, Kidney Disease, Lung Disease, Seizures, Thyroid Problems, Tuberculosis. [name], [name] ([id number]) Social History Never smoker, Marital Status - Widowed, Alcohol Use - Never, Drug Use - No History, Caffeine Use - Daily. Review of Systems (ROS) Constitutional Symptoms (General Health) Denies complaints or symptoms of Fever, Chills, Marked Weight Change. Respiratory The patient has no complaints or symptoms. Cardiovascular Complains or has symptoms of LE edema. Psychiatric The patient has no complaints or symptoms. Objective Constitutional Well-nourished and well-hydrated in no acute distress. Vitals Time Taken: 11:05 AM, Height: 64 in, Weight: 156 lbs, BMI: 26.8, Temperature: 99.2 F, Pulse: 74 bpm, Respiratory Rate: 18 breaths/min, Blood Pressure: 168/52 mmHg. Respiratory normal breathing without difficulty. clear to auscultation bilaterally. Cardiovascular regular rate  and rhythm with normal S1, S2. Psychiatric this patient is able to make decisions and demonstrates good insight into disease process. Alert and Oriented x 3. pleasant and cooperative. General Notes: Patient's wound did not have any significant adherent slough noted on the surface of the wound she is a good granular bed overall this appears to be doing excellent it's just taking it's time as far as healing is concerned. Integumentary (Hair, Skin) Wound #7 status is Open. Original cause of wound was Gradually Appeared.  The wound is located on the Right,Dorsal Foot. The wound measures 0.3cm length x 0.4cm width x 0.2cm depth; 0.094cm^2 area and 0.019cm^3 volume. There is Fat Layer (Subcutaneous Tissue) Exposed exposed. There is no tunneling or undermining noted. There is a small amount of serous drainage noted. The wound margin is epibole. There is large (67-100%) pink granulation within the wound bed. There is a small (1-33%) amount of necrotic tissue within the wound bed including Adherent Slough. The periwound skin appearance did not exhibit: Callus, Crepitus, Excoriation, Induration, Rash, Scarring, Dry/Scaly, Maceration, Atrophie Blanche, Cyanosis, Ecchymosis, Hemosiderin Staining, Mottled, Pallor, Rubor, Erythema. Periwound temperature was noted as No Abnormality. 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 Cleanse wound with mild soap and water May Shower, gently pat wound dry prior to applying new dressing. Anesthetic (add to Medication List): Wound #7 Right,Dorsal Foot: Topical Lidocaine 4% cream applied to wound bed prior to debridement (In Clinic Only). Benzocaine Topical Anesthetic Spray applied to wound bed prior to debridement (In Clinic Only). Skin Barriers/Peri-Wound Care: Wound #7 Right,Dorsal Foot: Skin Prep Primary Wound Dressing: Wound #7 Right,Dorsal Foot: Silver Collagen - prisma ag Secondary Dressing: Wound #7 Right,Dorsal Foot: Dry Gauze Tegaderm Dressing Change Frequency: Wound #7 Right,Dorsal Foot: Change dressing every day. Follow-up Appointments: Wound #7 Right,Dorsal  Foot: Return Appointment in 2 weeks. Edema Control: Wound #7 Right,Dorsal Foot: Elevate legs to the level of the heart and pump ankles as often as possible Off-Loading: Wound #7 Right,Dorsal Foot: Turn and reposition every 2 hours Additional Orders / Instructions: Wound #7 Right,Dorsal Foot: Increase protein intake. Other: - Vitamin C, Zinc Currently I'm gonna suggest that we go ahead and continue with the Current wound care measures and she seems to be doing well. Patient and her daughter both in agreement with this plan. Will subsequently see were things stand at follow-up. Please see above for specific wound care orders. We will see patient for re-evaluation in 1 week(s) here in the clinic. If anything worsens or changes patient will contact our office for additional recommendations. [name], [name] ([id number]) Electronic Signature(s) Signed: 02/27/2018 6:53:41 PM By: Worthy Keeler PA-C Entered By: Worthy Keeler on 02/27/2018 12:58:39 [name] ([id number]) -------------------------------------------------------------------------------- ROS/PFSH Details Patient Name: [name] Date of Service: 02/27/2018 11:00 AM Medical Record Number: [id number] Patient Account Number: [id number] Date of Birth/Sex: [birth year] (84 y.o. F) Treating RN: Cornell Barman Primary Care Provider: PATIENT, NO Other Clinician: Referring Provider: Referral, Self Treating Provider/Extender: STONE III, HOYT Weeks in Treatment: 18 Information Obtained From Patient Wound History Do you currently have one or more open woundso Yes How many open wounds do you currently haveo 1 Approximately  how long have you had your woundso 1.5 weeks How have you been treating your wound(s) until nowo bandaide Has your wound(s) ever healed and then re-openedo No Have you had any lab work done in the past montho No Have you tested positive for an antibiotic resistant organism (MRSA, VRE)o No Have you tested  positive for osteomyelitis (bone infection)o No Have you had any tests for circulation on your legso Yes Where [facility] the test [facility]o [facility] Have you had other problems associated with your woundso Swelling Constitutional Symptoms (General Health) Complaints and Symptoms: Negative for: Fever; Chills; Marked Weight Change Cardiovascular Complaints and Symptoms: Positive for: LE edema Medical History: Positive for: Hypertension Eyes Medical History: Positive for: Cataracts - had surgery Respiratory Complaints and Symptoms: No Complaints or Symptoms Musculoskeletal Medical History: Positive for: Osteoarthritis Oncologic Medical History: Negative for: Received Chemotherapy; Received Radiation Psychiatric KHRYSTYNE, ARPIN ([id number]) Complaints and Symptoms: No Complaints or Symptoms HBO Extended History Items Eyes: Cataracts Immunizations Pneumococcal Vaccine: Received Pneumococcal Vaccination: Yes Implantable Devices Family and Social History Cancer: No; Diabetes: Yes - Mother; Heart Disease: Yes - Siblings; Hereditary Spherocytosis: No; Hypertension: No; Kidney Disease: No; Lung Disease: No; Seizures: No; Stroke: Yes - Siblings; Thyroid Problems: No; Tuberculosis: No; Never smoker; Marital Status - Widowed; Alcohol Use: Never; Drug Use: No History; Caffeine Use: Daily; Financial Concerns: No; Food, Clothing or Shelter Needs: No; Support System Lacking: No; Transportation Concerns: No; Advanced Directives: No; Patient does not want information on Advanced Directives; Do not resuscitate: No; Living Will: No; Medical Power of Attorney: No Physician Affirmation I have reviewed and agree with the above information. Electronic Signature(s) Signed: 02/27/2018 5:25:47 PM By: Gretta Cool, BSN, RN, CWS, Kim RN, BSN Signed: 02/27/2018 6:53:41 PM By: Worthy Keeler PA-C Entered By: Worthy Keeler on 02/27/2018 12:57:43 [name]  ([id number]) -------------------------------------------------------------------------------- SuperBill Details Patient Name: [name] Date of Service: 02/27/2018 Medical Record Number: [id number] Patient Account Number: [id number] Date of Birth/Sex: 26-Jan-1919 (84 y.o. 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 I10 Essential (primary) hypertension Facility Procedures CPT4 Code: [id number] Description: 98338 - WOUND CARE VISIT-LEV 2 EST PT Modifier: Quantity: 1 Physician Procedures CPT4 Code Description: [id number] - WC PHYS LEVEL 3 - EST PT ICD-10 Diagnosis Description I70.235 Atherosclerosis of native arteries of right leg with ulceration o I70.232 Atherosclerosis of native arteries of right leg with ulceration o L97.512  Non-pressure chronic ulcer of other part of right foot with fat l I10 Essential (primary) hypertension Modifier: f other part of fo f calf ayer exposed Quantity: 1 ot Electronic Signature(s) Signed: 02/27/2018 6:53:41 PM By: Worthy Keeler PA-C Entered By: Worthy Keeler on 02/27/2018 12:58:54

## 2018-02-28 NOTE — Progress Notes (Signed)
[name] ([id number]) Visit Report for 02/27/2018 Arrival Information Details Patient Name: [name] Date of Service: 02/27/2018 11:00 AM Medical Record Number: [id number] Patient Account Number: [id number] Date of Birth/Sex: [birth year] (84 y.o. F) Treating RN: [name] Primary Care [name]: PATIENT, NO Other Clinician: Referring [name]: Referral, Self Treating [name]/Extender: STONE III, [name] Weeks in Treatment: 18 Visit Information History Since Last Visit Added or deleted any medications: No Patient Arrived: Walker Any new allergies or adverse reactions: No Arrival Time: 11:02 Had a fall or experienced change in No Accompanied By: dtr activities of daily living that may affect Transfer Assistance: None risk of falls: Patient Identification Verified: Yes Signs or symptoms of abuse/neglect since last visito No Secondary Verification Process Completed: Yes Hospitalized since last visit: No Patient Requires Transmission-Based Precautions: No Implantable device outside of the clinic excluding No Patient Has Alerts: No cellular tissue based products placed in the center since last visit: Has Dressing in Place as Prescribed: Yes Pain Present Now: No Electronic Signature(s) Signed: 02/27/2018 4:45:01 PM By: [name] Entered By: [name] on 02/27/2018 11:03:19 [name] ([id number]) -------------------------------------------------------------------------------- Clinic Level of Care Assessment Details Patient Name: [name] Date of Service: 02/27/2018 11:00 AM Medical Record Number: [id number] Patient Account Number: [id number] Date of Birth/Sex: 1919-07-03 (84 y.o. F) Treating RN: [name] Primary Care [name]: PATIENT, NO Other Clinician: Referring [name]: Referral, Self Treating [name]/Extender: STONE III, [name] Weeks in Treatment: 18 Clinic Level of Care Assessment Items TOOL 4 Quantity Score []  - Use when only an EandM  is performed on FOLLOW-UP visit 0 ASSESSMENTS - Nursing Assessment / Reassessment []  - Reassessment of Co-morbidities (includes updates in patient status) 0 X- 1 5 Reassessment of Adherence to Treatment Plan ASSESSMENTS - Wound and Skin Assessment / Reassessment X - Simple Wound Assessment / Reassessment - one wound 1 5 []  - 0 Complex Wound Assessment / Reassessment - multiple wounds []  - 0 Dermatologic / Skin Assessment (not related to wound area) ASSESSMENTS - Focused Assessment []  - Circumferential Edema Measurements - multi extremities 0 []  - 0 Nutritional Assessment / Counseling / Intervention []  - 0 Lower Extremity Assessment (monofilament, tuning fork, pulses) []  - 0 Peripheral Arterial Disease Assessment (using hand held doppler) ASSESSMENTS - Ostomy and/or Continence Assessment and Care []  - Incontinence Assessment and Management 0 []  - 0 Ostomy Care Assessment and Management (repouching, etc.) PROCESS - Coordination of Care []  - Simple Patient / Family Education for ongoing care 0 []  - 0 Complex (extensive) Patient / Family Education for ongoing care []  - 0 Staff obtains Programmer, systems, Records, Test Results / Process Orders []  - 0 Staff telephones HHA, Nursing Homes / Clarify orders / etc []  - 0 Routine Transfer to another Facility (non-emergent condition) []  - 0 Routine Hospital Admission (non-emergent condition) []  - 0 New Admissions / Biomedical engineer / Ordering NPWT, Apligraf, etc. []  - 0 Emergency Hospital Admission (emergent condition) X- 1 10 Simple Discharge Coordination [name] ([id number]) []  - 0 Complex (extensive) Discharge Coordination PROCESS - Special Needs []  - Pediatric / Minor Patient Management 0 []  - 0 Isolation Patient Management []  - 0 Hearing / Language / Visual special needs []  - 0 Assessment of Community assistance (transportation, D/C planning, etc.) []  - 0 Additional assistance / Altered mentation []  - 0 Support  Surface(s) Assessment (bed, cushion, seat, etc.) INTERVENTIONS - Wound Cleansing / Measurement X - Simple Wound Cleansing - one wound 1 5 []  - 0 Complex Wound Cleansing - multiple wounds X- 1 5  Wound Imaging (photographs - any number of wounds) []  - 0 Wound Tracing (instead of photographs) X- 1 5 Simple Wound Measurement - one wound []  - 0 Complex Wound Measurement - multiple wounds INTERVENTIONS - Wound Dressings []  - Small Wound Dressing one or multiple wounds 0 X- 1 15 Medium Wound Dressing one or multiple wounds []  - 0 Large Wound Dressing one or multiple wounds []  - 0 Application of Medications - topical []  - 0 Application of Medications - injection INTERVENTIONS - Miscellaneous []  - External ear exam 0 []  - 0 Specimen Collection (cultures, biopsies, blood, body fluids, etc.) []  - 0 Specimen(s) / Culture(s) sent or taken to Lab for analysis []  - 0 Patient Transfer (multiple staff / Civil Service fast streamer / Similar devices) []  - 0 Simple Staple / Suture removal (25 or less) []  - 0 Complex Staple / Suture removal (26 or more) []  - 0 Hypo / Hyperglycemic Management (close monitor of Blood Glucose) []  - 0 Ankle / Brachial Index (ABI) - do not check if billed separately X- 1 5 Vital Signs [name] ([id number]) Has the patient been seen at the hospital within the last three years: Yes Total Score: 55 Level Of Care: New/Established - Level 2 Electronic Signature(s) Signed: 02/27/2018 5:25:47 PM By: [name] Entered By: [name], BSN, RN, CWS, [name] on 02/27/2018 11:36:16 [name] ([id number]) -------------------------------------------------------------------------------- Encounter Discharge Information Details Patient Name: [name] Date of Service: 02/27/2018 11:00 AM Medical Record Number: [id number] Patient Account Number: [id number] Date of Birth/Sex: [birth year] (84 y.o. F) Treating RN: [name] Primary Care [name]: PATIENT, NO  Other Clinician: Referring [name]: Referral, Self Treating [name]/Extender: [name], [name] Weeks in Treatment: 18 Encounter Discharge Information Items Discharge Condition: Stable Ambulatory Status: Ambulatory Discharge Destination: Home Transportation: Private Auto Accompanied By: dtr Schedule Follow-up Appointment: Yes Clinical Summary of Care: Electronic Signature(s) Signed: 02/27/2018 3:46:25 PM By: [name] Entered By: [name] on 02/27/2018 11:44:23 [name] ([id number]) -------------------------------------------------------------------------------- Lower Extremity Assessment Details Patient Name: [name] Date of Service: 02/27/2018 11:00 AM Medical Record Number: [id number] Patient Account Number: [id number] Date of Birth/Sex: 03-15-1919 (84 y.o. F) Treating RN: [name] Primary Care Ahmere Hemenway: PATIENT, NO Other Clinician: Referring Thao Vanover: Referral, Self Treating Adrianna Dudas/Extender: STONE III, [name] Weeks in Treatment: 18 Edema Assessment Assessed: [Left: No] [Right: No] Edema: [Left: N] [Right: o] Vascular Assessment Pulses: Dorsalis Pedis Palpable: [Right:Yes] Posterior Tibial Extremity colors, hair growth, and conditions: Extremity Color: [Right:Normal] Hair Growth on Extremity: [Right:No] Temperature of Extremity: [Right:Warm] Capillary Refill: [Right:< 3 seconds] Toe Nail Assessment Left: Right: Thick: Yes Discolored: Yes Deformed: No Improper Length and Hygiene: No Electronic Signature(s) Signed: 02/27/2018 4:45:01 PM By: [name] Entered By: [name] on 02/27/2018 11:10:46 [name] ([id number]) -------------------------------------------------------------------------------- Multi Wound Chart Details Patient Name: [name] Date of Service: 02/27/2018 11:00 AM Medical Record Number: [id number] Patient Account Number: [id number] Date of Birth/Sex: 1919-01-21 (84 y.o. F) Treating RN: [name] Primary  Care Weslee Fogg: PATIENT, NO Other Clinician: Referring Zoey Bidwell: Referral, Self Treating Yida Hyams/Extender: STONE III, [name] Weeks in Treatment: 18 Vital Signs Height(in): 64 Pulse(bpm): 74 Weight(lbs): 156 Blood Pressure(mmHg): 168/52 Body Mass Index(BMI): 27 Temperature(F): 99.2 Respiratory Rate 18 (breaths/min): Photos: [N/A:N/A] Wound Location: Right Foot - Dorsal N/A N/A Wounding Event: Gradually Appeared N/A N/A Primary Etiology: Arterial Insufficiency Ulcer N/A N/A Comorbid History: Cataracts, Hypertension, N/A N/A Osteoarthritis Date Acquired: 10/11/2017 N/A N/A Weeks of Treatment: 18 N/A N/A Wound Status: Open N/A N/A Measurements L x W x D  0.3x0.4x0.2 N/A N/A (cm) Area (cm) : 0.094 N/A N/A Volume (cm) : 0.019 N/A N/A % Reduction in Area: 83.40% N/A N/A % Reduction in Volume: 83.20% N/A N/A Classification: Full Thickness Without N/A N/A Exposed Support Structures Exudate Amount: Small N/A N/A Exudate Type: Serous N/A N/A Exudate Color: amber N/A N/A Wound Margin: Epibole N/A N/A Granulation Amount: Large (67-100%) N/A N/A Granulation Quality: Pink N/A N/A Necrotic Amount: Small (1-33%) N/A N/A Exposed Structures: Fat Layer (Subcutaneous N/A N/A Tissue) Exposed: Yes Fascia: No Tendon: No Muscle: No Dickmann, Emberlin ([id number]) Joint: No Bone: No Epithelialization: Small (1-33%) N/A N/A Periwound Skin Texture: Excoriation: No N/A N/A Induration: No Callus: No Crepitus: No Rash: No Scarring: No Periwound Skin Moisture: Maceration: No N/A N/A Dry/Scaly: No Periwound Skin Color: Atrophie Blanche: No N/A N/A Cyanosis: No Ecchymosis: No Erythema: No Hemosiderin Staining: No Mottled: No Pallor: No Rubor: No Temperature: No Abnormality N/A N/A Tenderness on Palpation: No N/A N/A Wound Preparation: Ulcer Cleansing: N/A N/A Rinsed/Irrigated with Saline Topical Anesthetic Applied: Other: lidocaine 4% Treatment Notes Electronic  Signature(s) Signed: 02/27/2018 5:25:47 PM By: [name] Entered By: [name], BSN, RN, CWS, [name] on 02/27/2018 11:34:53 [name] ([id number]) -------------------------------------------------------------------------------- Multi-Disciplinary Care Plan Details Patient Name: [name] Date of Service: 02/27/2018 11:00 AM Medical Record Number: [id number] Patient Account Number: [id number] Date of Birth/Sex: [birth year] (84 y.o. F) Treating RN: [name] Primary Care Daysi Boggan: PATIENT, NO Other Clinician: Referring Latise Dilley: Referral, Self Treating Adrea Sherpa/Extender: STONE III, [name] Weeks in Treatment: 18 Active Inactive ` Abuse / Safety / Falls / Self Care Management Nursing Diagnoses: History of Falls Potential for falls Goals: Patient will not experience any injury related to falls Date Initiated: 10/23/2017 Target Resolution Date: 02/17/2018 Goal Status: Active Interventions: Assess Activities of Daily Living upon admission and as needed Assess fall risk on admission and as needed Assess: immobility, friction, shearing, incontinence upon admission and as needed Assess impairment of mobility on admission and as needed per policy Assess personal safety and home safety (as indicated) on admission and as needed Assess self care needs on admission and as needed Notes: ` Nutrition Nursing Diagnoses: Imbalanced nutrition Potential for alteratiion in Nutrition/Potential for imbalanced nutrition Goals: Patient/caregiver agrees to and verbalizes understanding of need to use nutritional supplements and/or vitamins as prescribed Date Initiated: 10/23/2017 Target Resolution Date: 02/17/2018 Goal Status: Active Interventions: Assess patient nutrition upon admission and as needed per policy Notes: ` Orientation to the Wound Care Program Nursing Diagnoses: TAHARI, CLABAUGH ([id number]) Knowledge deficit related to the wound healing center  program Goals: Patient/caregiver will verbalize understanding of the [place] Program Date Initiated: 10/23/2017 Target Resolution Date: 11/18/2017 Goal Status: Active Interventions: Provide education on orientation to the wound center Notes: ` Pain, Acute or Chronic Nursing Diagnoses: Pain, acute or chronic: actual or potential Potential alteration in comfort, pain Goals: Patient/caregiver will verbalize adequate pain control between visits Date Initiated: 10/23/2017 Target Resolution Date: 02/17/2018 Goal Status: Active Interventions: Complete pain assessment as per visit requirements Notes: ` Wound/Skin Impairment Nursing Diagnoses: Impaired tissue integrity Knowledge deficit related to ulceration/compromised skin integrity Goals: Ulcer/skin breakdown will have a volume reduction of 80% by week 12 Date Initiated: 10/23/2017 Target Resolution Date: 02/17/2018 Goal Status: Active Interventions: Assess patient/caregiver ability to perform ulcer/skin care regimen upon admission and as needed Assess ulceration(s) every visit Notes: Electronic Signature(s) Signed: 02/27/2018 5:25:47 PM By: [name] Entered By: [name], BSN, RN, CWS, [name] on 02/27/2018 11:34:33  LARENA, OHNEMUS ([id number]) -------------------------------------------------------------------------------- Pain Assessment Details Patient Name: LENNAN, MALONE Date of Service: 02/27/2018 11:00 AM Medical Record Number: [id number] Patient Account Number: [id number] Date of Birth/Sex: 05-Dec-1918 (84 y.o. F) Treating RN: [name] Primary Care [place] Mullett: PATIENT, NO Other Clinician: Referring Peni Rupard: Referral, Self Treating Brylea Pita/Extender: STONE III, [name] Weeks in Treatment: 18 Active Problems Location of Pain Severity and Description of Pain Patient Has Paino No Site Locations Pain Management and Medication Current Pain Management: Electronic Signature(s) Signed:  02/27/2018 4:45:01 PM By: [name] Entered By: [name] on 02/27/2018 11:04:38 [name] ([id number]) -------------------------------------------------------------------------------- Patient/Caregiver Education Details Patient Name: [name] Date of Service: 02/27/2018 11:00 AM Medical Record Number: [id number] Patient Account Number: [id number] Date of Birth/Gender: [birth year] (84 y.o. F) Treating RN: [name] Primary Care Physician: PATIENT, NO Other Clinician: Referring Physician: Referral, Self Treating Physician/Extender: [name], [name] Weeks in Treatment: 18 Education Assessment Education Provided To: Patient and Caregiver Education Topics Provided Wound/Skin Impairment: Handouts: Caring for Your Ulcer Methods: Explain/Verbal Responses: State content correctly Electronic Signature(s) Signed: 02/27/2018 3:46:25 PM By: [name] Entered By: [name] on 02/27/2018 11:46:53 [name] ([id number]) -------------------------------------------------------------------------------- Wound Assessment Details Patient Name: [name] Date of Service: 02/27/2018 11:00 AM Medical Record Number: [id number] Patient Account Number: [id number] Date of Birth/Sex: Aug 12, 1919 (84 y.o. F) Treating RN: [name] Primary Care Apryle Stowell: PATIENT, NO Other Clinician: Referring Jonda Alanis: Referral, Self Treating Demonica Farrey/Extender: STONE III, [name] Weeks in Treatment: 18 Wound Status Wound Number: 7 Primary Etiology: Arterial Insufficiency Ulcer Wound Location: Right Foot - Dorsal Wound Status: Open Wounding Event: Gradually Appeared Comorbid History: Cataracts, Hypertension, Osteoarthritis Date Acquired: 10/11/2017 Weeks Of Treatment: 18 Clustered Wound: No Photos Photo Uploaded By: [name] on 02/27/2018 11:31:39 Wound Measurements Length: (cm) 0.3 Width: (cm) 0.4 Depth: (cm) 0.2 Area: (cm) 0.094 Volume: (cm) 0.019 % Reduction in Area:  83.4% % Reduction in Volume: 83.2% Epithelialization: Small (1-33%) Tunneling: No Undermining: No Wound Description Full Thickness Without Exposed Support Classification: Structures Wound Margin: Epibole Exudate Small Amount: Exudate Type: Serous Exudate Color: amber Foul Odor After Cleansing: No Slough/Fibrino Yes Wound Bed Granulation Amount: Large (67-100%) Exposed Structure Granulation Quality: Pink Fascia Exposed: No Necrotic Amount: Small (1-33%) Fat Layer (Subcutaneous Tissue) Exposed: Yes Necrotic Quality: Adherent Slough Tendon Exposed: No Muscle Exposed: No Joint Exposed: No Bone Exposed: No Saiz, Evora ([id number]) Periwound Skin Texture Texture Color No Abnormalities Noted: No No Abnormalities Noted: No Callus: No Atrophie Blanche: No Crepitus: No Cyanosis: No Excoriation: No Ecchymosis: No Induration: No Erythema: No Rash: No Hemosiderin Staining: No Scarring: No Mottled: No Pallor: No Moisture Rubor: No No Abnormalities Noted: No Dry / Scaly: No Temperature / Pain Maceration: No Temperature: No Abnormality Wound Preparation Ulcer Cleansing: Rinsed/Irrigated with Saline Topical Anesthetic Applied: Other: lidocaine 4%, Treatment Notes Wound #7 (Right, Dorsal Foot) 1. Cleansed with: Clean wound with Normal Saline 2. Anesthetic Topical Lidocaine 4% cream to wound bed prior to debridement 4. Dressing Applied: Prisma Ag 5. Secondary Dressing Applied Dry Gauze Tegaderm Notes skin prep and tegaderm Electronic Signature(s) Signed: 02/27/2018 4:45:01 PM By: [name] Entered By: [name] on 02/27/2018 11:09:57 [name] ([id number]) -------------------------------------------------------------------------------- Vitals Details Patient Name: [name] Date of Service: 02/27/2018 11:00 AM Medical Record Number: [id number] Patient Account Number: [id number] Date of Birth/Sex: 05/27/1919 (84 y.o. F) Treating RN:  [name] Primary Care Kazmir Oki: PATIENT, NO Other Clinician: Referring Orlondo Holycross: Referral, Self Treating Kushi Kun/Extender: STONE III, [name] Weeks in Treatment: 18 Vital Signs Time Taken: 11:05 Temperature (F): 99.2 Height (in):  64 Pulse (bpm): 74 Weight (lbs): 156 Respiratory Rate (breaths/min): 18 Body Mass Index (BMI): 26.8 Blood Pressure (mmHg): 168/52 Reference Range: 80 - 120 mg / dl Electronic Signature(s) Signed: 02/27/2018 4:45:01 PM By: [name] Entered By: [name] on 02/27/2018 11:05:30

## 2018-03-07 NOTE — Progress Notes (Signed)
[name], [name] ([id number]) Visit Report for 02/13/2018 Arrival Information Details Patient Name: [name], BELOW Date of Service: 02/13/2018 11:00 AM Medical Record Number: [id number] Patient Account Number: [id number] Date of Birth/Sex: May 11, 1919 (84 y.o. F) Treating RN: Cornell Barman Primary Care Danilyn Cocke: PATIENT, NO Other Clinician: Referring Hazelee Harbold: Referral, Self Treating Anterrio Mccleery/Extender: STONE III, HOYT Weeks in Treatment: 16 Visit Information History Since Last Visit Added or deleted any medications: No Patient Arrived: Walker Any new allergies or adverse reactions: No Arrival Time: 10:56 Had a fall or experienced change in No Accompanied By: daughter activities of daily living that may affect Transfer Assistance: None risk of falls: Patient Identification Verified: Yes Signs or symptoms of abuse/neglect since last visito No Secondary Verification Process Completed: Yes Hospitalized since last visit: No Patient Requires Transmission-Based Precautions: No Implantable device outside of the clinic excluding No Patient Has Alerts: No cellular tissue based products placed in the center since last visit: Has Dressing in Place as Prescribed: Yes Pain Present Now: No Electronic Signature(s) Signed: 02/14/2018 5:47:31 PM By: Gretta Cool, BSN, RN, CWS, Kim RN, BSN Entered By: Gretta Cool, BSN, RN, CWS, Kim on 02/13/2018 10:57:39 [name] ([id number]) -------------------------------------------------------------------------------- Clinic Level of Care Assessment Details Patient Name: [name] Date of Service: 02/13/2018 11:00 AM Medical Record Number: [id number] Patient Account Number: [id number] Date of Birth/Sex: 30-Jun-1919 (84 y.o. F) Treating RN: Ahmed Prima Primary Care Meela Wareing: PATIENT, NO Other Clinician: Referring [employer]: Referral, Self Treating Nadea Kirkland/Extender: STONE III, HOYT Weeks in Treatment: 16 Clinic Level of Care Assessment Items TOOL 4 Quantity  Score X - Use when only an EandM is performed on FOLLOW-UP visit 1 0 ASSESSMENTS - Nursing Assessment / Reassessment X - Reassessment of Co-morbidities (includes updates in patient status) 1 10 X- 1 5 Reassessment of Adherence to Treatment Plan ASSESSMENTS - Wound and Skin Assessment / Reassessment X - Simple Wound Assessment / Reassessment - one wound 1 5 []  - 0 Complex Wound Assessment / Reassessment - multiple wounds []  - 0 Dermatologic / Skin Assessment (not related to wound area) ASSESSMENTS - Focused Assessment []  - Circumferential Edema Measurements - multi extremities 0 []  - 0 Nutritional Assessment / Counseling / Intervention []  - 0 Lower Extremity Assessment (monofilament, tuning fork, pulses) []  - 0 Peripheral Arterial Disease Assessment (using hand held doppler) ASSESSMENTS - Ostomy and/or Continence Assessment and Care []  - Incontinence Assessment and Management 0 []  - 0 Ostomy Care Assessment and Management (repouching, etc.) PROCESS - Coordination of Care X - Simple Patient / Family Education for ongoing care 1 15 []  - 0 Complex (extensive) Patient / Family Education for ongoing care []  - 0 Staff obtains Programmer, systems, Records, Test Results / Process Orders []  - 0 Staff telephones HHA, Nursing Homes / Clarify orders / etc []  - 0 Routine Transfer to another Facility (non-emergent condition) []  - 0 Routine Hospital Admission (non-emergent condition) []  - 0 New Admissions / Biomedical engineer / Ordering NPWT, Apligraf, etc. []  - 0 Emergency Hospital Admission (emergent condition) X- 1 10 Simple Discharge Coordination [name], [name] ([id number]) []  - 0 Complex (extensive) Discharge Coordination PROCESS - Special Needs []  - Pediatric / Minor Patient Management 0 []  - 0 Isolation Patient Management []  - 0 Hearing / Language / Visual special needs []  - 0 Assessment of Community assistance (transportation, D/C planning, etc.) []  - 0 Additional assistance  / Altered mentation []  - 0 Support Surface(s) Assessment (bed, cushion, seat, etc.) INTERVENTIONS - Wound Cleansing / Measurement X - Simple Wound Cleansing - one wound 1 5 []  -  0 Complex Wound Cleansing - multiple wounds X- 1 5 Wound Imaging (photographs - any number of wounds) []  - 0 Wound Tracing (instead of photographs) X- 1 5 Simple Wound Measurement - one wound []  - 0 Complex Wound Measurement - multiple wounds INTERVENTIONS - Wound Dressings X - Small Wound Dressing one or multiple wounds 1 10 []  - 0 Medium Wound Dressing one or multiple wounds []  - 0 Large Wound Dressing one or multiple wounds X- 1 5 Application of Medications - topical []  - 0 Application of Medications - injection INTERVENTIONS - Miscellaneous []  - External ear exam 0 []  - 0 Specimen Collection (cultures, biopsies, blood, body fluids, etc.) []  - 0 Specimen(s) / Culture(s) sent or taken to Lab for analysis []  - 0 Patient Transfer (multiple staff / Civil Service fast streamer / Similar devices) []  - 0 Simple Staple / Suture removal (25 or less) []  - 0 Complex Staple / Suture removal (26 or more) []  - 0 Hypo / Hyperglycemic Management (close monitor of Blood Glucose) []  - 0 Ankle / Brachial Index (ABI) - do not check if billed separately X- 1 5 Vital Signs [name], [name] ([id number]) Has the patient been seen at the hospital within the last three years: Yes Total Score: 80 Level Of Care: New/Established - Level 3 Electronic Signature(s) Signed: 02/13/2018 5:11:14 PM By: Alric Quan Entered By: Alric Quan on 02/13/2018 11:57:55 [name] ([id number]) -------------------------------------------------------------------------------- Encounter Discharge Information Details Patient Name: [name] Date of Service: 02/13/2018 11:00 AM Medical Record Number: [id number] Patient Account Number: [id number] Date of Birth/Sex: 02/02/1919 (84 y.o. F) Treating RN: Ahmed Prima Primary Care  Dalayza Zambrana: PATIENT, NO Other Clinician: Referring Latrese Carolan: Referral, Self Treating Adiya Selmer/Extender: STONE III, HOYT Weeks in Treatment: 16 Encounter Discharge Information Items Discharge Condition: Stable Ambulatory Status: Walker Discharge Destination: Home Transportation: Private Auto Accompanied By: daughter Schedule Follow-up Appointment: No Clinical Summary of Care: Electronic Signature(s) Signed: 03/07/2018 7:45:54 AM By: Harold Barban Entered By: Harold Barban on 02/13/2018 12:04:15 [name] ([id number]) -------------------------------------------------------------------------------- Lower Extremity Assessment Details Patient Name: [name] Date of Service: 02/13/2018 11:00 AM Medical Record Number: [id number] Patient Account Number: [id number] Date of Birth/Sex: 07/21/1919 (84 y.o. F) Treating RN: Cornell Barman Primary Care Shanaiya Bene: PATIENT, NO Other Clinician: Referring Akshara Blumenthal: Referral, Self Treating Clemente Dewey/Extender: STONE III, HOYT Weeks in Treatment: 16 Vascular Assessment Claudication: Claudication Assessment [Right:None] Pulses: Dorsalis Pedis Palpable: [Right:Yes] Posterior Tibial Extremity colors, hair growth, and conditions: Extremity Color: [Right:Normal] Hair Growth on Extremity: [Right:No] Temperature of Extremity: [Right:Warm] Capillary Refill: [Right:< 3 seconds] Toe Nail Assessment Left: Right: Thick: Yes Discolored: No Deformed: Yes Improper Length and Hygiene: No Electronic Signature(s) Signed: 02/14/2018 5:47:31 PM By: Gretta Cool, BSN, RN, CWS, Kim RN, BSN Entered By: Gretta Cool, BSN, RN, CWS, Kim on 02/13/2018 11:05:23 [name] ([id number]) -------------------------------------------------------------------------------- Multi Wound Chart Details Patient Name: [name] Date of Service: 02/13/2018 11:00 AM Medical Record Number: [id number] Patient Account Number: [id number] Date of Birth/Sex: 1919-09-27 (84 y.o.  F) Treating RN: Ahmed Prima Primary Care Mitsuru Dault: PATIENT, NO Other Clinician: Referring Vivek Grealish: Referral, Self Treating Donielle Kaigler/Extender: STONE III, HOYT Weeks in Treatment: 16 Vital Signs Height(in): 64 Pulse(bpm): 85 Weight(lbs): 156 Blood Pressure(mmHg): 163/57 Body Mass Index(BMI): 27 Temperature(F): 98.1 Respiratory Rate 16 (breaths/min): Photos: [N/A:N/A] Wound Location: Right Foot - Dorsal N/A N/A Wounding Event: Gradually Appeared N/A N/A Primary Etiology: Arterial Insufficiency Ulcer N/A N/A Comorbid History: Cataracts, Hypertension, N/A N/A Osteoarthritis Date Acquired: 10/11/2017 N/A N/A Weeks of Treatment: 16 N/A N/A Wound Status: Open N/A N/A Measurements L x  W x D 0.5x0.7x0.2 N/A N/A (cm) Area (cm) : 0.275 N/A N/A Volume (cm) : 0.055 N/A N/A % Reduction in Area: 51.30% N/A N/A % Reduction in Volume: 51.30% N/A N/A Classification: Full Thickness Without N/A N/A Exposed Support Structures Exudate Amount: Small N/A N/A Exudate Type: Serous N/A N/A Exudate Color: amber N/A N/A Wound Margin: Epibole N/A N/A Granulation Amount: Small (1-33%) N/A N/A Granulation Quality: Pink N/A N/A Necrotic Amount: Large (67-100%) N/A N/A Exposed Structures: Fat Layer (Subcutaneous N/A N/A Tissue) Exposed: Yes Fascia: No Tendon: No Muscle: No [name], [name] ([id number]) Joint: No Bone: No Epithelialization: Small (1-33%) N/A N/A Periwound Skin Texture: Excoriation: No N/A N/A Induration: No Callus: No Crepitus: No Rash: No Scarring: No Periwound Skin Moisture: Maceration: No N/A N/A Dry/Scaly: No Periwound Skin Color: Atrophie Blanche: No N/A N/A Cyanosis: No Ecchymosis: No Erythema: No Hemosiderin Staining: No Mottled: No Pallor: No Rubor: No Tenderness on Palpation: No N/A N/A Wound Preparation: Ulcer Cleansing: N/A N/A Rinsed/Irrigated with Saline Topical Anesthetic Applied: Other: lidocaine 4% Treatment Notes Electronic  Signature(s) Signed: 02/13/2018 5:11:14 PM By: Alric Quan Entered By: Alric Quan on 02/13/2018 11:46:38 [name] ([id number]) -------------------------------------------------------------------------------- Multi-Disciplinary Care Plan Details Patient Name: [name] Date of Service: 02/13/2018 11:00 AM Medical Record Number: [id number] Patient Account Number: [id number] Date of Birth/Sex: July 02, 1919 (84 y.o. F) Treating RN: Ahmed Prima Primary Care Yolander Goodie: PATIENT, NO Other Clinician: Referring Kayela Humphres: Referral, Self Treating Angel Weedon/Extender: STONE III, HOYT Weeks in Treatment: 16 Active Inactive ` Abuse / Safety / Falls / Self Care Management Nursing Diagnoses: History of Falls Potential for falls Goals: Patient will not experience any injury related to falls Date Initiated: 10/23/2017 Target Resolution Date: 02/17/2018 Goal Status: Active Interventions: Assess Activities of Daily Living upon admission and as needed Assess fall risk on admission and as needed Assess: immobility, friction, shearing, incontinence upon admission and as needed Assess impairment of mobility on admission and as needed per policy Assess personal safety and home safety (as indicated) on admission and as needed Assess self care needs on admission and as needed Notes: ` Nutrition Nursing Diagnoses: Imbalanced nutrition Potential for alteratiion in Nutrition/Potential for imbalanced nutrition Goals: Patient/caregiver agrees to and verbalizes understanding of need to use nutritional supplements and/or vitamins as prescribed Date Initiated: 10/23/2017 Target Resolution Date: 02/17/2018 Goal Status: Active Interventions: Assess patient nutrition upon admission and as needed per policy Notes: ` Orientation to the Wound Care Program Nursing Diagnoses: [name], [name] ([id number]) Knowledge deficit related to the wound healing center program Goals: Patient/caregiver  will verbalize understanding of the [place] Program Date Initiated: 10/23/2017 Target Resolution Date: 11/18/2017 Goal Status: Active Interventions: Provide education on orientation to the wound center Notes: ` Pain, Acute or Chronic Nursing Diagnoses: Pain, acute or chronic: actual or potential Potential alteration in comfort, pain Goals: Patient/caregiver will verbalize adequate pain control between visits Date Initiated: 10/23/2017 Target Resolution Date: 02/17/2018 Goal Status: Active Interventions: Complete pain assessment as per visit requirements Notes: ` Wound/Skin Impairment Nursing Diagnoses: Impaired tissue integrity Knowledge deficit related to ulceration/compromised skin integrity Goals: Ulcer/skin breakdown will have a volume reduction of 80% by week 12 Date Initiated: 10/23/2017 Target Resolution Date: 02/17/2018 Goal Status: Active Interventions: Assess patient/caregiver ability to perform ulcer/skin care regimen upon admission and as needed Assess ulceration(s) every visit Notes: Electronic Signature(s) Signed: 02/13/2018 5:11:14 PM By: Alric Quan Entered By: Alric Quan on 02/13/2018 11:46:23 [name] ([id number]) -------------------------------------------------------------------------------- Pain Assessment Details Patient Name: [name] Date of Service: 02/13/2018 11:00 AM Medical  Record Number: [id number] Patient Account Number: [id number] Date of Birth/Sex: March 16, 1919 (84 y.o. F) Treating RN: Cornell Barman Primary Care Arvin Abello: PATIENT, NO Other Clinician: Referring Oluwatosin Bracy: Referral, Self Treating [name]/Extender: STONE III, HOYT Weeks in Treatment: 16 Active Problems Location of Pain Severity and Description of Pain Patient Has Paino No Site Locations Pain Management and Medication Current Pain Management: Electronic Signature(s) Signed: 02/14/2018 5:47:31 PM By: Gretta Cool, BSN, RN, CWS, Kim RN, BSN Entered By:  Gretta Cool, BSN, RN, CWS, Kim on 02/13/2018 10:58:00 [name] ([id number]) -------------------------------------------------------------------------------- Patient/Caregiver Education Details Patient Name: [name] Date of Service: 02/13/2018 11:00 AM Medical Record Number: [id number] Patient Account Number: [id number] Date of Birth/Gender: 03-05-1919 (84 y.o. F) Treating RN: Ahmed Prima Primary Care Physician: PATIENT, NO Other Clinician: Referring Physician: Referral, Self Treating Physician/Extender: Melburn Hake, HOYT Weeks in Treatment: 16 Education Assessment Education Provided To: Patient Education Topics Provided Wound Debridement: Handouts: Wound Debridement Methods: Explain/Verbal Responses: State content correctly Wound/Skin Impairment: Handouts: Caring for Your Ulcer Methods: Explain/Verbal Responses: State content correctly Electronic Signature(s) Signed: 03/07/2018 7:45:54 AM By: Harold Barban Entered By: Harold Barban on 02/13/2018 12:04:49 [name] ([id number]) -------------------------------------------------------------------------------- Wound Assessment Details Patient Name: [name] Date of Service: 02/13/2018 11:00 AM Medical Record Number: [id number] Patient Account Number: [id number] Date of Birth/Sex: [birth year] (84 y.o. F) Treating RN: Cornell Barman Primary Care Jaquia Benedicto: PATIENT, NO Other Clinician: Referring Tea Collums: Referral, Self Treating [name]/Extender: STONE III, HOYT Weeks in Treatment: 16 Wound Status Wound Number: 7 Primary Etiology: Arterial Insufficiency Ulcer Wound Location: Right Foot - Dorsal Wound Status: Open Wounding Event: Gradually Appeared Comorbid History: Cataracts, Hypertension, Osteoarthritis Date Acquired: 10/11/2017 Weeks Of Treatment: 16 Clustered Wound: No Photos Photo Uploaded By: Gretta Cool, BSN, RN, CWS, Kim on 02/13/2018 11:21:41 Wound Measurements Length: (cm) 0.5 Width: (cm) 0.7 Depth:  (cm) 0.2 Area: (cm) 0.275 Volume: (cm) 0.055 % Reduction in Area: 51.3% % Reduction in Volume: 51.3% Epithelialization: Small (1-33%) Tunneling: No Undermining: No Wound Description Full Thickness Without Exposed Support Foul Odo Classification: Structures Slough/F Wound Margin: Epibole Exudate Small Amount: Exudate Type: Serous Exudate Color: amber r After Cleansing: No ibrino Yes Wound Bed Granulation Amount: Small (1-33%) Exposed Structure Granulation Quality: Pink Fascia Exposed: No Necrotic Amount: Large (67-100%) Fat Layer (Subcutaneous Tissue) Exposed: Yes Necrotic Quality: Adherent Slough Tendon Exposed: No Muscle Exposed: No Joint Exposed: No Bone Exposed: No [name], [name] ([id number]) Periwound Skin Texture Texture Color No Abnormalities Noted: No No Abnormalities Noted: No Callus: No Atrophie Blanche: No Crepitus: No Cyanosis: No Excoriation: No Ecchymosis: No Induration: No Erythema: No Rash: No Hemosiderin Staining: No Scarring: No Mottled: No Pallor: No Moisture Rubor: No No Abnormalities Noted: No Dry / Scaly: No Maceration: No Wound Preparation Ulcer Cleansing: Rinsed/Irrigated with Saline Topical Anesthetic Applied: Other: lidocaine 4%, Electronic Signature(s) Signed: 02/14/2018 5:47:31 PM By: Gretta Cool, BSN, RN, CWS, Kim RN, BSN Entered By: Gretta Cool, BSN, RN, CWS, Kim on 02/13/2018 11:04:35 [name] ([id number]) -------------------------------------------------------------------------------- Vitals Details Patient Name: [name] Date of Service: 02/13/2018 11:00 AM Medical Record Number: [id number] Patient Account Number: [id number] Date of Birth/Sex: November 18, 1918 (84 y.o. F) Treating RN: Cornell Barman Primary Care Shayonna Ocampo: PATIENT, NO Other Clinician: Referring Manolito Jurewicz: Referral, Self Treating Burlie Cajamarca/Extender: STONE III, HOYT Weeks in Treatment: 16 Vital Signs Time Taken: 10:58 Temperature (F): 98.1 Height (in):  64 Pulse (bpm): 85 Weight (lbs): 156 Respiratory Rate (breaths/min): 16 Body Mass Index (BMI): 26.8 Blood Pressure (mmHg): 163/57 Reference Range: 80 - 120 mg / dl Electronic Signature(s) Signed: 02/14/2018 5:47:31 PM By: Gretta Cool, BSN, RN,  CWS, Kim RN, BSN Entered By: Gretta Cool, BSN, RN, CWS, Kim on 02/13/2018 49:20:10

## 2018-03-13 ENCOUNTER — Encounter: Payer: Medicare PPO | Attending: Physician Assistant | Admitting: Physician Assistant

## 2018-03-13 DIAGNOSIS — I771 Stricture of artery: Secondary | ICD-10-CM | POA: Insufficient documentation

## 2018-03-13 DIAGNOSIS — M1990 Unspecified osteoarthritis, unspecified site: Secondary | ICD-10-CM | POA: Insufficient documentation

## 2018-03-13 DIAGNOSIS — I10 Essential (primary) hypertension: Secondary | ICD-10-CM | POA: Diagnosis not present

## 2018-03-13 DIAGNOSIS — Z8249 Family history of ischemic heart disease and other diseases of the circulatory system: Secondary | ICD-10-CM | POA: Insufficient documentation

## 2018-03-13 DIAGNOSIS — Z9010 Acquired absence of unspecified breast and nipple: Secondary | ICD-10-CM | POA: Diagnosis not present

## 2018-03-13 DIAGNOSIS — I70235 Atherosclerosis of native arteries of right leg with ulceration of other part of foot: Secondary | ICD-10-CM | POA: Diagnosis not present

## 2018-03-13 DIAGNOSIS — L97512 Non-pressure chronic ulcer of other part of right foot with fat layer exposed: Secondary | ICD-10-CM | POA: Insufficient documentation

## 2018-03-13 DIAGNOSIS — I70232 Atherosclerosis of native arteries of right leg with ulceration of calf: Secondary | ICD-10-CM | POA: Insufficient documentation

## 2018-03-14 NOTE — Progress Notes (Signed)
[name], [name] ([id number]) Visit Report for 03/13/2018 Arrival Information Details Patient Name: [name] Date of Service: 03/13/2018 11:00 AM Medical Record Number: [id number] Patient Account Number: [id number] Date of Birth/Sex: 1918-11-10 (84 y.o. F) Treating RN: [name] Primary Care [name]: PATIENT, NO Other Clinician: Referring [name]: Referral, Self Treating [name]/Extender: [name], [name] Weeks in Treatment: 20 Visit Information History Since Last Visit Added or deleted any medications: No Patient Arrived: Walker Any new allergies or adverse reactions: No Arrival Time: 11:11 Had a fall or experienced change in No Accompanied By: dtr activities of daily living that may affect Transfer Assistance: None risk of falls: Patient Identification Verified: Yes Signs or symptoms of abuse/neglect since last visito No Secondary Verification Process Completed: Yes Hospitalized since last visit: No Patient Requires Transmission-Based Precautions: No Implantable device outside of the clinic excluding No Patient Has Alerts: No cellular tissue based products placed in the center since last visit: Has Dressing in Place as Prescribed: Yes Pain Present Now: No Electronic Signature(s) Signed: 03/13/2018 5:22:13 PM By: [name] Entered By: [name] on 03/13/2018 11:11:44 [name] ([id number]) -------------------------------------------------------------------------------- Clinic Level of Care Assessment Details Patient Name: [name] Date of Service: 03/13/2018 11:00 AM Medical Record Number: [id number] Patient Account Number: [id number] Date of Birth/Sex: [birth year] (84 y.o. F) Treating RN: [name] Primary Care [name]: PATIENT, NO Other Clinician: Referring [name]: Referral, Self Treating [name]/Extender: [name], [name] Weeks in Treatment: 20 Clinic Level of Care Assessment Items TOOL 4 Quantity Score X - Use when only an EandM  is performed on FOLLOW-UP visit 1 0 ASSESSMENTS - Nursing Assessment / Reassessment X - Reassessment of Co-morbidities (includes updates in patient status) 1 10 X- 1 5 Reassessment of Adherence to Treatment Plan ASSESSMENTS - Wound and Skin Assessment / Reassessment X - Simple Wound Assessment / Reassessment - one wound 1 5 []  - 0 Complex Wound Assessment / Reassessment - multiple wounds []  - 0 Dermatologic / Skin Assessment (not related to wound area) ASSESSMENTS - Focused Assessment []  - Circumferential Edema Measurements - multi extremities 0 []  - 0 Nutritional Assessment / Counseling / Intervention []  - 0 Lower Extremity Assessment (monofilament, tuning fork, pulses) []  - 0 Peripheral Arterial Disease Assessment (using hand held doppler) ASSESSMENTS - Ostomy and/or Continence Assessment and Care []  - Incontinence Assessment and Management 0 []  - 0 Ostomy Care Assessment and Management (repouching, etc.) PROCESS - Coordination of Care X - Simple Patient / Family Education for ongoing care 1 15 []  - 0 Complex (extensive) Patient / Family Education for ongoing care []  - 0 Staff obtains Programmer, systems, Records, Test Results / Process Orders []  - 0 Staff telephones HHA, Nursing Homes / Clarify orders / etc []  - 0 Routine Transfer to another Facility (non-emergent condition) []  - 0 Routine Hospital Admission (non-emergent condition) []  - 0 New Admissions / Biomedical engineer / Ordering NPWT, Apligraf, etc. []  - 0 Emergency Hospital Admission (emergent condition) X- 1 10 Simple Discharge Coordination [name], [name] ([id number]) []  - 0 Complex (extensive) Discharge Coordination PROCESS - Special Needs []  - Pediatric / Minor Patient Management 0 []  - 0 Isolation Patient Management []  - 0 Hearing / Language / Visual special needs []  - 0 Assessment of Community assistance (transportation, D/C planning, etc.) []  - 0 Additional assistance / Altered mentation []  -  0 Support Surface(s) Assessment (bed, cushion, seat, etc.) INTERVENTIONS - Wound Cleansing / Measurement X - Simple Wound Cleansing - one wound 1 5 []  - 0 Complex Wound Cleansing - multiple wounds  X- 1 5 Wound Imaging (photographs - any number of wounds) []  - 0 Wound Tracing (instead of photographs) []  - 0 Simple Wound Measurement - one wound []  - 0 Complex Wound Measurement - multiple wounds INTERVENTIONS - Wound Dressings []  - Small Wound Dressing one or multiple wounds 0 []  - 0 Medium Wound Dressing one or multiple wounds []  - 0 Large Wound Dressing one or multiple wounds []  - 0 Application of Medications - topical []  - 0 Application of Medications - injection INTERVENTIONS - Miscellaneous []  - External ear exam 0 []  - 0 Specimen Collection (cultures, biopsies, blood, body fluids, etc.) []  - 0 Specimen(s) / Culture(s) sent or taken to Lab for analysis []  - 0 Patient Transfer (multiple staff / Civil Service fast streamer / Similar devices) []  - 0 Simple Staple / Suture removal (25 or less) []  - 0 Complex Staple / Suture removal (26 or more) []  - 0 Hypo / Hyperglycemic Management (close monitor of Blood Glucose) []  - 0 Ankle / Brachial Index (ABI) - do not check if billed separately X- 1 5 Vital Signs [name], [name] ([id number]) Has the patient been seen at the hospital within the last three years: Yes Total Score: 60 Level Of Care: New/Established - Level 2 Electronic Signature(s) Signed: 03/13/2018 5:20:44 PM By: [name] Entered By: [name] on 03/13/2018 11:54:21 [name] ([id number]) -------------------------------------------------------------------------------- Encounter Discharge Information Details Patient Name: [name] Date of Service: 03/13/2018 11:00 AM Medical Record Number: [id number] Patient Account Number: [id number] Date of Birth/Sex: July 30, 1919 (84 y.o. F) Treating RN: [name] Primary Care [name]: PATIENT, NO Other  Clinician: Referring [name]: Referral, Self Treating [name]/Extender: [name], [name] Weeks in Treatment: 20 Encounter Discharge Information Items Discharge Condition: Stable Ambulatory Status: Walker Discharge Destination: Home Transportation: Private Auto Accompanied By: daughter Schedule Follow-up Appointment: No Clinical Summary of Care: Electronic Signature(s) Signed: 03/13/2018 5:20:44 PM By: [name] Entered By: [name] on 03/13/2018 11:47:27 [name] ([id number]) -------------------------------------------------------------------------------- Lower Extremity Assessment Details Patient Name: [name] Date of Service: 03/13/2018 11:00 AM Medical Record Number: [id number] Patient Account Number: [id number] Date of Birth/Sex: 02-Jun-1919 (84 y.o. F) Treating RN: [name] Primary Care Kyleeann Cremeans: PATIENT, NO Other Clinician: Referring Finlee Milo: Referral, Self Treating Elease Swarm/Extender: [name], [name] Weeks in Treatment: 20 Vascular Assessment Pulses: Dorsalis Pedis Palpable: [Right:Yes] Posterior Tibial Extremity colors, hair growth, and conditions: Extremity Color: [Right:Hyperpigmented] Hair Growth on Extremity: [Right:No] Temperature of Extremity: [Right:Warm] Capillary Refill: [Right:< 3 seconds] Toe Nail Assessment Left: Right: Thick: Yes Discolored: Yes Deformed: No Improper Length and Hygiene: No Electronic Signature(s) Signed: 03/13/2018 5:22:13 PM By: [name] Entered By: [name] on 03/13/2018 11:21:23 [name] ([id number]) -------------------------------------------------------------------------------- Multi Wound Chart Details Patient Name: [name] Date of Service: 03/13/2018 11:00 AM Medical Record Number: [id number] Patient Account Number: [id number] Date of Birth/Sex: 05-02-1919 (83 y.o. 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] [N/A:N/A] Comorbid History: [7:Cataracts, Hypertension, Osteoarthritis] [N/A:N/A] Date Acquired: [7:10/11/2017] [N/A:N/A] Weeks of Treatment: [7:20] [N/A:N/A] Wound Status: [7:Healed - Epithelialized] [N/A:N/A] Measurements L x W x D [7:0x0x0] [N/A:N/A] (cm) Area (cm) : [7:0] [N/A:N/A] Volume (cm) : [7:0] [N/A:N/A] % Reduction in Area: [7:100.00%] [N/A:N/A] %  Reduction in Volume: [7:100.00%] [N/A:N/A] Classification: [7:Full Thickness Without Exposed Support Structures] [N/A:N/A] Exudate Amount: [7:None Present] [N/A:N/A] Wound Margin: [7:Epibole] [N/A:N/A] Granulation Amount: [7:None Present (0%)] [N/A:N/A] Necrotic Amount: [7:None Present (0%)] [N/A:N/A] Exposed Structures: [7:Fascia: No Fat Layer (Subcutaneous Tissue) Exposed: No Tendon: No Muscle: No Joint: No Bone: No] [N/A:N/A] Epithelialization: [7:Large (67-100%)] [N/A:N/A] Periwound Skin Texture: [7:Excoriation: No Induration: No Callus: No Crepitus: No Rash: No Scarring: No] [N/A:N/A] Periwound Skin Moisture: [7:Maceration: No Dry/Scaly: No] [N/A:N/A] Periwound Skin Color: Atrophie Blanche: No N/A N/A Cyanosis: No Ecchymosis: No Erythema: No Hemosiderin Staining: No Mottled: No Pallor: No Rubor: No Temperature: No Abnormality N/A N/A Tenderness on Palpation: No N/A N/A Wound Preparation: Ulcer Cleansing: N/A N/A Rinsed/Irrigated with Saline Topical Anesthetic Applied: None Treatment Notes Electronic Signature(s) Signed: 03/13/2018 5:20:44 PM By: [name] Entered By: [name] on 03/13/2018 11:45:55 [name] ([id number]) -------------------------------------------------------------------------------- Multi-Disciplinary Care Plan Details Patient Name: [name] Date of Service: 03/13/2018 11:00 AM Medical Record Number: [id number] Patient Account Number: [id number] Date of Birth/Sex: May 07, 1919 (84 y.o. F) Treating RN: [name] Primary Care Laurence Folz: PATIENT, NO Other Clinician: Referring Eternity Dexter: Referral, Self Treating Moorea Boissonneault/Extender: [name], [name] Weeks in Treatment: 20 Active Inactive Electronic Signature(s) Signed: 03/13/2018 5:20:44 PM By: [name] Entered By: [name] on 03/13/2018 11:45:47 [name] ([id number]) -------------------------------------------------------------------------------- Pain Assessment Details Patient Name: [name] Date of Service: 03/13/2018 11:00 AM Medical Record Number: [id number] Patient Account Number: [id number] Date of Birth/Sex: 18-Sep-1919 (84 y.o. F) Treating RN: [name] Primary Care Aithana Kushner: PATIENT, NO Other Clinician: Referring Geary [name]: Referral, Self Treating Sevyn Markham/Extender: [name], [name] Weeks in Treatment: 20 Active Problems Location of Pain Severity and Description of Pain Patient Has Paino No Site Locations Pain Management and Medication Current Pain Management: Electronic Signature(s) Signed: 03/13/2018 5:22:13 PM By: [name] Entered By: [name] on 03/13/2018 11:13:09 [name] ([id number]) -------------------------------------------------------------------------------- Patient/Caregiver Education Details Patient Name: [name] Date of Service: 03/13/2018 11:00 AM Medical Record Number: [id number] Patient Account Number: [id number] Date of Birth/Gender: 1918/11/26 (84 y.o. F) Treating RN: [name] Primary Care Physician: PATIENT, NO Other Clinician: Referring Physician:  Referral, Self Treating Physician/Extender: Melburn Hake, [name] Weeks in Treatment: 20 Education Assessment Education Provided To: Patient and Caregiver daughter Education Topics Provided Wound/Skin Impairment: Handouts: Other: Keep area protected for 2 weeks. Please call our office if you have any questions or concerns. Methods: Explain/Verbal Responses: State content correctly Electronic Signature(s) Signed: 03/13/2018 5:20:44 PM By: [name] Entered By: [name] on 03/13/2018 11:47:44 [name] ([id number]) -------------------------------------------------------------------------------- Wound Assessment Details Patient Name: [name] Date of Service: 03/13/2018 11:00 AM Medical Record Number: [id number] Patient Account Number: [id number] Date of Birth/Sex: 1918/12/17 (84 y.o. F) Treating RN: [name] Primary Care Jayana Kotula: PATIENT, NO Other Clinician: Referring Emnet Monk: Referral, Self Treating Brittany Amirault/Extender: [name], [name] Weeks in Treatment: 20 Wound Status Wound Number: [id number] Primary Etiology: Arterial Insufficiency Ulcer Wound Location: Right Foot - Dorsal Wound Status: Healed - Epithelialized Wounding Event: Gradually Appeared Comorbid History: Cataracts, Hypertension, Osteoarthritis Date Acquired: 10/11/2017 Weeks Of Treatment: 20 Clustered Wound: No Photos Photo Uploaded By: [name] on 03/13/2018 14:58:49 Wound Measurements Length: (cm) 0 Width: (cm) 0 Depth: (cm) 0 Area: (cm) 0 Volume: (cm) 0 % Reduction in Area: 100% % Reduction in Volume: 100% Epithelialization: Large (67-100%) Tunneling: No Undermining: No Wound Description Full Thickness Without Exposed Support Foul O Classification: Structures Slough Wound Margin: Epibole Exudate None Present Amount: dor After Cleansing: No /Fibrino No Wound Bed Granulation Amount: None Present (0%)  Exposed Structure Necrotic Amount: None Present (0%) Fascia Exposed:  No Fat Layer (Subcutaneous Tissue) Exposed: No Tendon Exposed: No Muscle Exposed: No Joint Exposed: No Bone Exposed: No Periwound Skin Texture Texture Color [name], [name] ([id number]) No Abnormalities Noted: No No Abnormalities Noted: No Callus: No Atrophie Blanche: No Crepitus: No Cyanosis: No Excoriation: No Ecchymosis: No Induration: No Erythema: No Rash: No Hemosiderin Staining: No Scarring: No Mottled: No Pallor: No Moisture Rubor: No No Abnormalities Noted: No Dry / Scaly: No Temperature / Pain Maceration: No Temperature: No Abnormality Wound Preparation Ulcer Cleansing: Rinsed/Irrigated with Saline Topical Anesthetic Applied: None Electronic Signature(s) Signed: 03/13/2018 5:20:44 PM By: [name] Entered By: [name] on 03/13/2018 11:45:19 [name] ([id number]) -------------------------------------------------------------------------------- [place] Details Patient Name: [name] Date of Service: 03/13/2018 11:00 AM Medical Record Number: [id number] Patient Account Number: [id number] Date of Birth/Sex: 01-04-1919 (84 y.o. F) Treating RN: [name] Primary Care Micajah Dennin: PATIENT, NO Other Clinician: Referring Kayda Allers: Referral, Self Treating Armarion Greek/Extender: [name], [name] Weeks in Treatment: 20 Vital Signs Time Taken: 11:13 Temperature (F): 98.5 Height (in): 64 Pulse (bpm): 76 Weight (lbs): 156 Respiratory Rate (breaths/min): 16 Body Mass Index (BMI): 26.8 Blood Pressure (mmHg): 146/55 Reference Range: 80 - 120 mg / dl Electronic Signature(s) Signed: 03/13/2018 5:22:13 PM By: [name] Entered By: [name] on 03/13/2018 11:13:29

## 2018-03-16 NOTE — Progress Notes (Signed)
LANYLA, COSTELLO ([id number]) Visit Report for 03/13/2018 Chief Complaint Document Details Patient Name: ALVAH, GILDER Date of Service: 03/13/2018 11:00 AM Medical Record Number: [id number] Patient Account Number: [id number] Date of Birth/Sex: 1919-01-14 (84 y.o. F) Treating RN: Ahmed Prima Primary Care Provider: PATIENT, NO Other Clinician: Referring Provider: Referral, Self Treating Provider/Extender: STONE III,  Weeks in Treatment: 20 Information Obtained from: Patient Chief Complaint Right foot Ulcers Electronic Signature(s) Signed: 03/14/2018 8:38:06 AM By: Worthy Keeler PA-C Entered By: Worthy Keeler on 03/13/2018 11:12:54 [name] ([id number]) -------------------------------------------------------------------------------- HPI Details Patient Name: [name] Date of Service: 03/13/2018 11:00 AM Medical Record Number: [id number] Patient Account Number: [id number] Date of Birth/Sex: 09/19/1919 (84 y.o. F) Treating RN: Ahmed Prima Primary Care Provider: PATIENT, NO Other Clinician: Referring Provider: Referral, Self Treating Provider/Extender: STONE III,  Weeks in Treatment: 20 History of Present Illness HPI Description: 04/06/16; this is a 84 year old woman who arrives accompanied by 2 daughters for a wound on her left ankle and her left fifth toe. These have apparently been present for a year. I'm not quite certain how she came to this clinic however she was being followed by Sharlotte Alamo her podiatrist for these wounds. She was also referred to [facility] and they apparently did a test presumably arterial studies although we don't have any of these results and we couldn't get through to the office today. The family but has been applying a combination of Bactroban and a light bandage and perhaps more recently Silvadene cream. She did have an x-ray of the foot roughly 6 months ago at the podiatry office the family was unaware that if  there were any abnormalities. Apparently they have not seen any healing here. Our intake nurse noted a slight skin tear on the right anterior lower leg. Nobody seemed aware of this. ABIs calculated in this clinic was 0.3 on the right and 0.4 on the left I have reviewed things in [facility]. There is very little information on this patient. She apparently follows in current total clinic which we don't have information from. She has mentioned already been to a [facility]. She has a history of hypothyroidism, nephrolithiasis arthritis and has had a previous mastectomy. 04/13/16; patient's x-ray was normal. She is already been to see Dr. Delana Meyer vascular surgery. Her arterial exam was from November 2016 this showed a left ABI of 0.62 her right of 0.76. Her duplex ultrasound of the left leg showed biphasic waves in the common femoral and distal femoral artery however monophasic waves in the superficial femoral artery proximal and mid biphasic it distal. 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 The patient tells me that she has pain at night when she tries to lie down which is improved by getting up and sitting in the chair this sounds like claudication at rest. Her wounds are on the left medial malleolus and the dorsal fifth toe small punched out wounds that are right on bone. We use Santyl last week 04/20/16: nurse informed me pt has declined evaluation for significant PAD. she denies systemic s/s of infections. 04/27/16;; the patient has had noninvasive arterial studies done in November 2016. ABI and the left was 0.62. Monophasic waves at the superficial femoral artery. Occluded to the posterior tibial artery. So had greater than 50% stenosis of the right superficial femoral and greater than 50% stenosis of the left superficial femoral artery. She had bilateral tibial peroneal artery disease. Both of the wounds on the left fifth toe and left lateral malleolus have  been present for more than a year. They  have been to see vein and vascular. The patient has some pain but miraculously I think the wounds have largely been stable. No evidence of infection 05/04/16; she goes for a noninvasive study tomorrow and then sees Dr. Fletcher Anon on Monday. By the time she is here next week we should have a better picture of whether something can be done with regards to her arterial flow. We continue to have ischemic-looking wounds on the left fifth toe and left lateral malleolus. 05/10/16; the patient went for her arterial studies and saw Dr. Fletcher Anon. As predicted she is felt to have critical limb ischemia. The feeling is that she has occlusion of the SFA. The feeling would she would be a candidate for a stent to the SFA. The patient did not make a decision to proceed with the procedure and she is here with family members to discuss this me today. He shouldn't has a lot of pain and cannot sleep and rest well at night per her family. 05/24/16; the patient went and had a complex revascularization/angioplasty of the left superficial femoral artery followed by drug-coated balloon angioplasty and spots stenting. She tolerated the procedure well. She was recommended for dual antiplatelet drugs with Plavix and aspirin for at least a month. She went yesterday for I believe follow-up serial Dopplers and ABIs although I don't see these results. The patient is unfortunately complaining of a lot of pain in her bilateral lower legs below the knees from the ankle to the knees. She apparently was prescribed lidocaine and apparently put this on her legs instead of over the wound areas. This may have something to do with it however there is a lot of edema in her bilateral legs I was able to find her arterial studies from yesterday. The left ABI has improved up to 0.66 post left SFA stent. The bilateral great toe indices remain abnormal with the left being in the 0.25 range. Duplex ultrasound showed her left SFA stent is patent monophasic  waveforms persist in the left leg 06/07/16; continued punched-out areas over the dorsal left fifth toe and left lateral malleolus. No major improvement 06/28/16; the areas over her dorsal left fifth toe and left lateral malleolus are covered in surface slough we are using Iodoflex 07/05/16. We have been using Iodoflex for 2-3 weeks now. I have not been debridement is because of pain DESERI, LOSS ([id number]) 07/19/16 currently we have been using Iodoflex for roughly the past month. Previously we were unable to debride due to pain although today patient states that the pain is not nearly as severe as it has been in the past. In fact she rates this to be a 1 out of 10 and it worse to a to 10 with palpation of the wound. All and all her and her daughter feel like this is actually doing steadily better at this point in time. She is pleased with progress and we have been seeing her every 2 weeks. 08/02/16; this is a delightful 85 year old woman I have not seen in over a month. She has arterial insufficiency wounds remaining over the left lateral malleolus and the left fifth toe. With the help of Dr. Fletcher Anon we are able to get her revascularized on the left. The 2 wounds on the left have been making good progress and are definitely smaller especially the area over the left lateral malleolus. She is certainly in a lot less pain than she used to be although I still  think there is some claudication type pain. Unfortunately she is developed a area on the right lateral foot which is a small open area but I think is threatened. I suspect a small ischemic areas well. Also on her right fifth toe there is an area that is not open but looks as though it is receiving too much pressure from footwear. Finally an area over her right malleolus although I don't think this is on its way to anything ominous 08/17/16 patient presents today for follow up evaluation she tells me that she is really doing fairly well from a pain  standpoint compared to where she has been previous. She is tolerating the dressing changes without any complication. She continues to have discharge and drainage however. 08-30-16 Ms. Bice presents today with her daughter, she states that she continues to have intermittent shooting pains to the left lateral fifth toe at this site of seems to be a healed ulcer. She denies any other issues that her wound related since her last appointment. Her daughter states that she is in need of a tramadol refill at this time as she uses half a tablet at at bedtime to aid in sleep due to foot pain. Iodoflex has been used on all wounds in previous dressing changes. 09/13/16; the patient has ischemic wounds in her feet. The area over the left lateral malleolus is healed and the area over the left fifth toe looks improved.. She has an area on the lateral aspect of her right foot which is a small but deep wound. Finally she has a new open area on the medial aspect of the left medial malleolus. This is superficial 09/27/16; the area over the left lateral malleolus remains healed. The area over the left fifth toe has a surface and she states the pain is better but I don't think this is completely closed. She has an area on the lateral aspect of her right foot is a small but deep wound. The new open area on the medial aspect of the left ankle was closed from last time. 10/18/16; the area over her left lateral malleolus remains healed. The area over the left fifth toe has a surface over the top of this however there is no overt open area. Given the underlying issues of severe PAD and continued pain in the toe I would think it would be unlikely this is truly healed however I am not planning to debridement this area. The area on the right lateral foot which was a more recent wound is a small punched out painful area again has significant surface slough and nonviable tissue. It is clear the patient still has claudication  type pain however she remains functional. I have been giving her tramadol when necessary and that seems to help a lot with her pain the patient follows up with Dr. Fletcher Anon on January 18/18 11/01/16; patient missed her follow-up with Dr. Fletcher Anon last week due to a snow day. Follow-up is now on February 15. The areas on the right lateral malleolus and dorsal right fifth toe remain closed over. The fifth toe was tentative is there is a surface eschar however I'm not going to disturb this. Therefore, her only open areas on the right lateral foot. This is a small punched out area. We have been using Prisma. I'm quite convinced this is an ischemic wound 11/15/16; patient has follow-up with Dr. Fletcher Anon on February 15. She has no open wound on the left leg and doesn't really complain of claudication that  I can determine from talking to her. However on the right leg she clearly has some degree of claudication. The remaining open wound is on the right lateral foot. We have been using Iodosorb ointment 11/29/16; patient saw Dr. Fletcher Anon on 11/24/16. His comment is that her wound on her right lateral foot seems to be improving with local wound care. She has known significant right SFA disease. Her lower extremity Doppler will be repeated and according to the patient's daughter that appointment is on March 15. He is left with the thought that endovascular intervention in the right SFA might be necessary. The patient has quite a bit of pain especially at night related to the wound in her right foot. We changed her to Iodosorb ointment last week 12/13/16; this is a patient I follow every 2 weeks largely on a palliative approach at this point about ischemic wounds currently in the right foot. Initially she had them on her left lateral malleolus and left fifth toe. The area on the left lateral malleolus healed and the fifth toe has a surface eschar that I have elected not to remove. Both of these improved after revascularization by  Dr. Fletcher Anon. We have been using Iodosorb to the right lateral foot not much change here. 12/27/16; the patient had her arterial studies. This showed known bilateral SFA disease. Stable right ABI 0.5 to stable left ABI at 0.7 to the did not do it TBI on the right it was 0.61 on the left she has a stent in the long segment of her left SFA from 05/18/16. All of her wounds are somewhat better. She states her pain is better in the right foot is improved. 01/10/17- patient is here for follow-up dilation of her right lateral foot ulcer. She has been tolerating Iodosorb. She is voices no complaints or concerns 01/24/17; small ischemic wound on right lateral foot. still non viable cover. follows with Dr. Fletcher Anon on 4/30. No open area on left foot Delaguila, Estill Bamberg ([id number]) 02/07/17 doing well and states she is pain free. Saw Dr. Fletcher Anon who said she is doing well and has "50% flow in the right leg and 70% in the left. According to her daughter he does not wish to do any further interventions 02/21/17; small ischemic wound on the right lateral foot. Per her daughter and the patient she has no open wound on the left foot and ankle which were her initial presenting wounds. still has claudication type pain at night she is been to see interventional cardiology who does not feel that she has a need for intervention on the right leg at present. She still has claudication type pain in the right leg which she manages at night with when necessary tramadol that I have prescribed 03/14/17; small ischemic wound on the right lateral foot. They've been dressing this at home with Iodosorb ointment. The patient does not complain of any pain. The wound has a surface eschar over it and there is really no visible open area. This is similar to how the areas on the left leg healed 04/11/17; the small ischemic wound on her right lateral foot is finally closed over. Small amount of eschar over the surface however I did not attempt to remove  this. The patient claims to be asymptomatic she is not having any claudication that I'm able to elicit although her activity is limited Readmission: 10/23/17 on evaluation today patient appears to be doing somewhat poorly in regard to her right foot where she has two ulcers at  this point. The worst is on the lateral aspect of her foot medial first metatarsal region is not nearly as bad. With that being said these did arise seemingly out of nowhere she has previously had a similar issue in the past these have always been ischemic wounds due to arterial insufficiency. She does see Dr. Fletcher Anon as her vascular specialist and it has been noted that her ABI's are somewhat low. She actually has a repeat evaluation coming up this March 2019 to reevaluate her blood flow. On the last check 12/22/16 it was noted that patient had stable ABI's in the lower moderate abnormal range in regard to the right in regard to the left and the upper moderate at normal range. Patient is having some discomfort though nothing severe at this point in time. She is seen with her daughter and son-in-law today. No fevers, chills, nausea, or vomiting noted at this time. 10/30/17 on evaluation today patient appears to be doing well in regard to her wounds. The right lateral foot wound appears to show signs of cleaning up nicely there is granulation noted underneath that the bed of the wound although there still some [facility] covering. Nonetheless she still has a lot of discomfort and I really do not want to proceed with debridement due to the fact that she does have so much discomfort. Good news is the tramadol has been helping her at bedtime she only takes this once a day and that has been extremely beneficial. Unfortunately she does not have a primary right now as she is awaiting a new one which is the reason that I am prescribing the tramadol. Nonetheless I'm pleased with how things seem to be progressing in regard to her  ulcers. 11/06/17 on evaluation today patient appears to be doing decently well as far as maintaining in regard to her ulcers. Unfortunately I did review her arterial study which shows that she has on the right arterial obstruction involving the superficial femoral and popliteal artery as well is the superficial femoral artery with a high grade stenosis versus inclusion. Collateral flow noted in the distal portion of the SFA. Severe progression is noted compared to previous study. Unfortunately being the patient's wounds do not seem to be healing as appropriately and quickly as we would like I do believe this is likely a result of poor vascular flow to the right lower extremity. This was discussed with patient and her daughter during the office visit today. 11/14/17 on evaluation today patient continues to have a fairly stalled ulcer in regard to her to ulcers on the right lower extremity. She obviously did have significant findings on her arterial study and I do believe this is again due to poor vascular flow. She does have an appointment later this afternoon with her vascular specialist for further evaluation to see if there any recommendations at that point. With that being said she continues to have discomfort which I do believe is arterial in nature in regard to insufficiency. 11/21/17 on evaluation today patient appears to be doing fairly well in regard to the appearance of her wounds other than the fact that she does have erythema surrounding the wound bed at this point in fact this encompasses the majority of the dorsal surface of her right foot. There is additional granulation noted today that was not noted previously on evaluation and again if that were alone were the findings that we were seeing I would be very happy with the current progress. However unfortunately she is having  the erythema which concerns me for the possibility of infection although the other possibility is that she could  just be having a local reaction due to the I resort. In the past she never had this issue however when she use this for the root left foot which makes me again come back to my concern about infection. 11/28/17 on evaluation today patient is status post having had an angiogram performed on 11/22/17 where it was noted that she had severe disease involving the TP trunk and proximal peroneal artery. Subsequently she underwent successful atherectomy and drug coated balloon angioplasty to the right SFA, TP trunk, and proximal peroneal artery. Patient stated only of has 10% residual stenosis and overall the procedure at the time was both tolerated well as well as considered a success. This was performed by Dr. Sophronia Simas. Patient was subsequently discharged home on 11/23/17 from the hospital. Overall she states that her leg pain is not nearly as severe as what it was previous which is excellent news. She also has been sleeping with her RAEGYN, RENDA ([id number]) leg in the bed previously she was hanging this over the edge due to the pain. She also seems to have much better blood flow on evaluation today. 12/05/17 on evaluation today patient appears to be doing very well in regard to her quite lateral great toe as well as the right lateral foot ulcer. She has excellent blood flow and great capillary refill noted on evaluation today which is excellent news. With that being said she does have discomfort but mainly at the site of the wounds especially lateral foot wound but nothing like what she was experiencing previously. This is rated to be a 3/10 at most. She is seen with her daughter present. 12/12/17 on evaluation today patient appears to be doing very well in regard to her right foot and right medial great toe ulcers. She has been tolerating the dressing changes without complication. She doesn't seem to have any significant discomfort which is also good news. Overall she has been doing excellent since her  vascular intervention in my opinion. She actually does have follow-up later today with vascular for a repeat arterial study to ensure everything seems to be doing well following her surgery. 12/19/17 on evaluation today patient appears to be doing excellent in regard to her lower extremity ulcers. In fact the right medial great toe ulcer has completely resolved at this point which is excellent news. The right lateral foot ulcer seems to be showing signs of improving week by week and obviously this is good news as well. Overall I'm very pleased with the progress that she has made up to this point. She does have some Slough covering the lateral foot wound which does require sharp debridement today. I did review her vascular note from Dr. Dellis Filbert and it appears that everything seems to be doing well from a vascular standpoint and the patient's foot continues to be warm and appears well vascularized. 01/02/18 on evaluation today patient appears to be doing well in regard to her right lateral foot ulcer. She has been tolerating the dressing changes without complication. There does not appear to be any evidence of infection at this time which is also good news. She has definite new granulation noted. 01/16/18 on evaluation today patient appears to be doing very well. She has been tolerating the dressing changes without complication she has good epithelialization noted at this point. There is no evidence of infection. Overall I'm extremely happy with the progress she  seems to be making. 01/30/18 on evaluation today patient presents for follow-up concerning her right lateral foot ulcer. She has been tolerating the dressing changes without complication. There does not appear to be any evidence of infection which is good news. Overall I am definitely pleased with the progress she seems to have made. Patient's daughter is likewise also happy. Nonetheless she is having some issues with nighttime pain previously I had  prescribed for her tramadol although she has not had this recently she has been out. This seems to be one reason why her pain at night has been a little worse. Nonetheless fortunately taking one tramadol at bedtime seems to help with this and she's able to sleep much better. 02/13/18 on evaluation today patient appears to be doing very well in regard to her right lateral foot ulcer. She is making progress albeit slow in regard to the healing of this wound she still seems to have a warm foot and toes which is great news. Obviously in the beginning she had a lot of vascular issues. Overall I'm pleased. 02/27/18 on evaluation today patient appears to be doing very well in regard to her right lateral foot ulcer. Patient's foot does not seem to have any pain she states that's excellent in that regard. Unfortunately it's just taking a very long time to heal but fortunately is making good progress. 03/13/18 on evaluation today patient appears to be doing excellent in regard to her right lateral foot ulcer. In fact at this point this appears to be completely healed which is excellent news. Overall she's having no pain and they have not noted any discharge or drainage. Electronic Signature(s) Signed: 03/14/2018 8:38:06 AM By: Worthy Keeler PA-C Entered By: Worthy Keeler on 03/14/2018 02:07:13 [name] ([id number]) -------------------------------------------------------------------------------- Physical Exam Details Patient Name: [name] Date of Service: 03/13/2018 11:00 AM Medical Record Number: [id number] Patient Account Number: [id number] Date of Birth/Sex: [birth year] (84 y.o. F) Treating RN: Ahmed Prima Primary Care Provider: PATIENT, NO Other Clinician: Referring Provider: Referral, Self Treating Provider/Extender: STONE III,  Weeks in Treatment: 47 Constitutional Well-nourished and well-hydrated in no acute distress. Respiratory normal breathing without  difficulty. Psychiatric this patient is able to make decisions and demonstrates good insight into disease process. Alert and Oriented x 3. pleasant and cooperative. Notes Overall on inspection of the wound bed I see no evidence of an open wound at this point. She did have a little bit of dry skin on the medial right first toe this was easily removed without complication. I believe she is ready for discharge. Electronic Signature(s) Signed: 03/14/2018 8:38:06 AM By: Worthy Keeler PA-C Entered By: Worthy Keeler on 03/14/2018 02:07:52 [name] ([id number]) -------------------------------------------------------------------------------- Physician Orders Details Patient Name: [name] Date of Service: 03/13/2018 11:00 AM Medical Record Number: [id number] Patient Account Number: [id number] Date of Birth/Sex: 01-22-1919 (84 y.o. F) Treating RN: Ahmed Prima Primary Care Provider: PATIENT, NO Other Clinician: Referring Provider: Referral, Self Treating Provider/Extender: STONE III,  Weeks in Treatment: 20 Verbal / Phone Orders: Yes Clinician: Carolyne Fiscal, Debi Read Back and Verified: Yes Diagnosis Coding ICD-10 Coding Code Description I70.235 Atherosclerosis of native arteries of right leg with ulceration of other part of foot I70.232 Atherosclerosis of native arteries of right leg with ulceration of calf L97.512 Non-pressure chronic ulcer of other part of right foot with fat layer exposed I10 Essential (primary) hypertension Discharge From [facility] o Discharge from [place] area protected for 2 weeks. Please call our  office if you have any questions or concerns. Electronic Signature(s) Signed: 03/13/2018 5:20:44 PM By: Alric Quan Signed: 03/14/2018 8:38:06 AM By: Worthy Keeler PA-C Entered By: Alric Quan on 03/13/2018 11:46:41 [name]  ([id number]) -------------------------------------------------------------------------------- Problem List Details Patient Name: [name] Date of Service: 03/13/2018 11:00 AM Medical Record Number: [id number] Patient Account Number: [id number] Date of Birth/Sex: 24-Jun-1919 (84 y.o. F) Treating RN: Ahmed Prima Primary Care Provider: PATIENT, NO Other Clinician: Referring Provider: Referral, Self Treating Provider/Extender: STONE III,  Weeks in Treatment: 20 Active Problems ICD-10 Impacting Encounter Code Description Active Date Wound Healing Diagnosis I70.235 Atherosclerosis of native arteries of right leg with ulceration of 10/23/2017 No Yes other part of foot I70.232 Atherosclerosis of native arteries of right leg with ulceration of 10/23/2017 No Yes calf L97.512 Non-pressure chronic ulcer of other part of right foot with fat 10/23/2017 No Yes layer exposed I10 Essential (primary) hypertension 10/24/2017 No Yes Inactive Problems Resolved Problems Electronic Signature(s) Signed: 03/14/2018 8:38:06 AM By: Worthy Keeler PA-C Entered By: Worthy Keeler on 03/13/2018 11:12:45 [name] ([id number]) -------------------------------------------------------------------------------- Progress Note Details Patient Name: [name] Date of Service: 03/13/2018 11:00 AM Medical Record Number: [id number] Patient Account Number: [id number] Date of Birth/Sex: Nov 02, 1918 (84 y.o. F) Treating RN: Ahmed Prima Primary Care Provider: PATIENT, NO Other Clinician: Referring Provider: Referral, Self Treating Provider/Extender: STONE III,  Weeks in Treatment: 20 Subjective Chief Complaint Information obtained from Patient Right foot Ulcers History of Present Illness (HPI) 04/06/16; this is a 84 year old woman who arrives accompanied by 2 daughters for a wound on her left ankle and her left fifth toe. These have apparently been present for a year. I'm not quite certain how  she came to this clinic however she was being followed by Sharlotte Alamo her podiatrist for these wounds. She was also referred to [facility] and they apparently did a test presumably arterial studies although we don't have any of these results and we couldn't get through to the office today. The family but has been applying a combination of Bactroban and a light bandage and perhaps more recently Silvadene cream. She did have an x-ray of the foot roughly 6 months ago at the podiatry office the family was unaware that if there were any abnormalities. Apparently they have not seen any healing here. Our intake nurse noted a slight skin tear on the right anterior lower leg. Nobody seemed aware of this. ABIs calculated in this clinic was 0.3 on the right and 0.4 on the left I have reviewed things [facility]. There is very little information on this patient. She apparently follows in current total clinic which we don't have information from. She has mentioned already been to a [facility]. She has a history of hypothyroidism, nephrolithiasis arthritis and has had a previous mastectomy. 04/13/16; patient's x-ray was normal. She is already been to see Dr. Delana Meyer vascular surgery. Her arterial exam was from November 2016 this showed a left ABI of 0.62 her right of 0.76. Her duplex ultrasound of the left leg showed biphasic waves in the common femoral and distal femoral artery however monophasic waves in the superficial femoral artery proximal and mid biphasic it distal. 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 The patient tells me that she has pain at night when she tries to lie down which is improved by getting up and sitting in the chair this sounds like claudication at rest. Her wounds are on the left medial malleolus and the dorsal fifth toe small  punched out wounds that are right on bone. We use Santyl last week 04/20/16: nurse informed me pt has declined evaluation for significant PAD. she  denies systemic s/s of infections. 04/27/16;; the patient has had noninvasive arterial studies done in November 2016. ABI and the left was 0.62. Monophasic waves at the superficial femoral artery. Occluded to the posterior tibial artery. So had greater than 50% stenosis of the right superficial femoral and greater than 50% stenosis of the left superficial femoral artery. She had bilateral tibial peroneal artery disease. Both of the wounds on the left fifth toe and left lateral malleolus have been present for more than a year. They have been to see vein and vascular. The patient has some pain but miraculously I think the wounds have largely been stable. No evidence of infection 05/04/16; she goes for a noninvasive study tomorrow and then sees Dr. Fletcher Anon on Monday. By the time she is here next week we should have a better picture of whether something can be done with regards to her arterial flow. We continue to have ischemic-looking wounds on the left fifth toe and left lateral malleolus. 05/10/16; the patient went for her arterial studies and saw Dr. Fletcher Anon. As predicted she is felt to have critical limb ischemia. The feeling is that she has occlusion of the SFA. The feeling would she would be a candidate for a stent to the SFA. The patient did not make a decision to proceed with the procedure and she is here with family members to discuss this me today. He shouldn't has a lot of pain and cannot sleep and rest well at night per her family. 05/24/16; the patient went and had a complex revascularization/angioplasty of the left superficial femoral artery followed by drug-coated balloon angioplasty and spots stenting. She tolerated the procedure well. She was recommended for dual antiplatelet drugs with Plavix and aspirin for at least a month. She went yesterday for I believe follow-up serial Dopplers and ABIs although I don't see these results. The patient is unfortunately complaining of a lot of pain in her  bilateral lower legs below the knees from the ankle to the knees. She apparently was prescribed lidocaine and apparently put this on her legs instead of over the wound areas. This may have something to do with it however there is a lot of edema in her bilateral legs I was able to find her arterial studies from yesterday. The left ABI has improved up to 0.66 post left SFA stent. The bilateral Cottrill, Ethelreda ([id number]) great toe indices remain abnormal with the left being in the 0.25 range. Duplex ultrasound showed her left SFA stent is patent monophasic waveforms persist in the left leg 06/07/16; continued punched-out areas over the dorsal left fifth toe and left lateral malleolus. No major improvement 06/28/16; the areas over her dorsal left fifth toe and left lateral malleolus are covered in surface slough we are using Iodoflex 07/05/16. We have been using Iodoflex for 2-3 weeks now. I have not been debridement is because of pain 07/19/16 currently we have been using Iodoflex for roughly the past month. Previously we were unable to debride due to pain although today patient states that the pain is not nearly as severe as it has been in the past. In fact she rates this to be a 1 out of 10 and it worse to a to 10 with palpation of the wound. All and all her and her daughter feel like this is actually doing steadily better at  this point in time. She is pleased with progress and we have been seeing her every 2 weeks. 08/02/16; this is a delightful 84 year old woman I have not seen in over a month. She has arterial insufficiency wounds remaining over the left lateral malleolus and the left fifth toe. With the help of Dr. Fletcher Anon we are able to get her revascularized on the left. The 2 wounds on the left have been making good progress and are definitely smaller especially the area over the left lateral malleolus. She is certainly in a lot less pain than she used to be although I still think there is some  claudication type pain. Unfortunately she is developed a area on the right lateral foot which is a small open area but I think is threatened. I suspect a small ischemic areas well. Also on her right fifth toe there is an area that is not open but looks as though it is receiving too much pressure from footwear. Finally an area over her right malleolus although I don't think this is on its way to anything ominous 08/17/16 patient presents today for follow up evaluation she tells me that she is really doing fairly well from a pain standpoint compared to where she has been previous. She is tolerating the dressing changes without any complication. She continues to have discharge and drainage however. 08-30-16 Ms. Morello presents today with her daughter, she states that she continues to have intermittent shooting pains to the left lateral fifth toe at this site of seems to be a healed ulcer. She denies any other issues that her wound related since her last appointment. Her daughter states that she is in need of a tramadol refill at this time as she uses half a tablet at at bedtime to aid in sleep due to foot pain. Iodoflex has been used on all wounds in previous dressing changes. 09/13/16; the patient has ischemic wounds in her feet. The area over the left lateral malleolus is healed and the area over the left fifth toe looks improved.. She has an area on the lateral aspect of her right foot which is a small but deep wound. Finally she has a new open area on the medial aspect of the left medial malleolus. This is superficial 09/27/16; the area over the left lateral malleolus remains healed. The area over the left fifth toe has a surface and she states the pain is better but I don't think this is completely closed. She has an area on the lateral aspect of her right foot is a small but deep wound. The new open area on the medial aspect of the left ankle was closed from last time. 10/18/16; the area over her  left lateral malleolus remains healed. The area over the left fifth toe has a surface over the top of this however there is no overt open area. Given the underlying issues of severe PAD and continued pain in the toe I would think it would be unlikely this is truly healed however I am not planning to debridement this area. The area on the right lateral foot which was a more recent wound is a small punched out painful area again has significant surface slough and nonviable tissue. It is clear the patient still has claudication type pain however she remains functional. I have been giving her tramadol when necessary and that seems to help a lot with her pain the patient follows up with Dr. Fletcher Anon on January 18/18 11/01/16; patient missed her follow-up with  Dr. Fletcher Anon last week due to a snow day. Follow-up is now on February 15. The areas on the right lateral malleolus and dorsal right fifth toe remain closed over. The fifth toe was tentative is there is a surface eschar however I'm not going to disturb this. Therefore, her only open areas on the right lateral foot. This is a small punched out area. We have been using Prisma. I'm quite convinced this is an ischemic wound 11/15/16; patient has follow-up with Dr. Fletcher Anon on February 15. She has no open wound on the left leg and doesn't really complain of claudication that I can determine from talking to her. However on the right leg she clearly has some degree of claudication. The remaining open wound is on the right lateral foot. We have been using Iodosorb ointment 11/29/16; patient saw Dr. Fletcher Anon on 11/24/16. His comment is that her wound on her right lateral foot seems to be improving with local wound care. She has known significant right SFA disease. Her lower extremity Doppler will be repeated and according to the patient's daughter that appointment is on March 15. He is left with the thought that endovascular intervention in the right SFA might be necessary.  The patient has quite a bit of pain especially at night related to the wound in her right foot. We changed her to Iodosorb ointment last week 12/13/16; this is a patient I follow every 2 weeks largely on a palliative approach at this point about ischemic wounds currently in the right foot. Initially she had them on her left lateral malleolus and left fifth toe. The area on the left lateral malleolus healed and the fifth toe has a surface eschar that I have elected not to remove. Both of these improved after revascularization by Dr. Fletcher Anon. We have been using Iodosorb to the right lateral foot not much change here. 12/27/16; the patient had her arterial studies. This showed known bilateral SFA disease. Stable right ABI 0.5 to stable left ABI at 0.7 to the did not do it TBI on the right it was 0.61 on the left she has a stent in the long segment of her left SFA from [place], Estill Bamberg ([id number]) 05/18/16. All of her wounds are somewhat better. She states her pain is better in the right foot is improved. 01/10/17- patient is here for follow-up dilation of her right lateral foot ulcer. She has been tolerating Iodosorb. She is voices no complaints or concerns 01/24/17; small ischemic wound on right lateral foot. still non viable cover. follows with Dr. Fletcher Anon on 4/30. No open area on left foot 02/07/17 doing well and states she is pain free. Saw Dr. Fletcher Anon who said she is doing well and has "50% flow in the right leg and 70% in the left. According to her daughter he does not wish to do any further interventions 02/21/17; small ischemic wound on the right lateral foot. Per her daughter and the patient she has no open wound on the left foot and ankle which were her initial presenting wounds. still has claudication type pain at night she is been to see interventional cardiology who does not feel that she has a need for intervention on the right leg at present. She still has claudication type pain in the right leg which  she manages at night with when necessary tramadol that I have prescribed 03/14/17; small ischemic wound on the right lateral foot. They've been dressing this at home with Iodosorb ointment. The patient does not complain of any  pain. The wound has a surface eschar over it and there is really no visible open area. This is similar to how the areas on the left leg healed 04/11/17; the small ischemic wound on her right lateral foot is finally closed over. Small amount of eschar over the surface however I did not attempt to remove this. The patient claims to be asymptomatic she is not having any claudication that I'm able to elicit although her activity is limited Readmission: 10/23/17 on evaluation today patient appears to be doing somewhat poorly in regard to her right foot where she has two ulcers at this point. The worst is on the lateral aspect of her foot medial first metatarsal region is not nearly as bad. With that being said these did arise seemingly out of nowhere she has previously had a similar issue in the past these have always been ischemic wounds due to arterial insufficiency. She does see Dr. Fletcher Anon as her vascular specialist and it has been noted that her ABI's are somewhat low. She actually has a repeat evaluation coming up this March 2019 to reevaluate her blood flow. On the last check 12/22/16 it was noted that patient had stable ABI's in the lower moderate abnormal range in regard to the right in regard to the left and the upper moderate at normal range. Patient is having some discomfort though nothing severe at this point in time. She is seen with her daughter and son-in-law today. No fevers, chills, nausea, or vomiting noted at this time. 10/30/17 on evaluation today patient appears to be doing well in regard to her wounds. The right lateral foot wound appears to show signs of cleaning up nicely there is granulation noted underneath that the bed of the wound although there still  some [facility] covering. Nonetheless she still has a lot of discomfort and I really do not want to proceed with debridement due to the fact that she does have so much discomfort. Good news is the tramadol has been helping her at bedtime she only takes this once a day and that has been extremely beneficial. Unfortunately she does not have a primary right now as she is awaiting a new one which is the reason that I am prescribing the tramadol. Nonetheless I'm pleased with how things seem to be progressing in regard to her ulcers. 11/06/17 on evaluation today patient appears to be doing decently well as far as maintaining in regard to her ulcers. Unfortunately I did review her arterial study which shows that she has on the right arterial obstruction involving the superficial femoral and popliteal artery as well is the superficial femoral artery with a high grade stenosis versus inclusion. Collateral flow noted in the distal portion of the SFA. Severe progression is noted compared to previous study. Unfortunately being the patient's wounds do not seem to be healing as appropriately and quickly as we would like I do believe this is likely a result of poor vascular flow to the right lower extremity. This was discussed with patient and her daughter during the office visit today. 11/14/17 on evaluation today patient continues to have a fairly stalled ulcer in regard to her to ulcers on the right lower extremity. She obviously did have significant findings on her arterial study and I do believe this is again due to poor vascular flow. She does have an appointment later this afternoon with her vascular specialist for further evaluation to see if there any recommendations at that point. With that being said she continues  to have discomfort which I do believe is arterial in nature in regard to insufficiency. 11/21/17 on evaluation today patient appears to be doing fairly well in regard to the appearance of her wounds  other than the fact that she does have erythema surrounding the wound bed at this point in fact this encompasses the majority of the dorsal surface of her right foot. There is additional granulation noted today that was not noted previously on evaluation and again if that were alone were the findings that we were seeing I would be very happy with the current progress. However unfortunately she is having the erythema which concerns me for the possibility of infection although the other possibility is that she could just be having a local reaction due to the I resort. In the past she never had this issue however when she use this for the root left foot which makes me again come back to my concern about infection. JAMILEX, BOHNSACK ([id number]) 11/28/17 on evaluation today patient is status post having had an angiogram performed on 11/22/17 where it was noted that she had severe disease involving the TP trunk and proximal peroneal artery. Subsequently she underwent successful atherectomy and drug coated balloon angioplasty to the right SFA, TP trunk, and proximal peroneal artery. Patient stated only of has 10% residual stenosis and overall the procedure at the time was both tolerated well as well as considered a success. This was performed by Dr. Sophronia Simas. Patient was subsequently discharged home on 11/23/17 from the hospital. Overall she states that her leg pain is not nearly as severe as what it was previous which is excellent news. She also has been sleeping with her leg in the bed previously she was hanging this over the edge due to the pain. She also seems to have much better blood flow on evaluation today. 12/05/17 on evaluation today patient appears to be doing very well in regard to her quite lateral great toe as well as the right lateral foot ulcer. She has excellent blood flow and great capillary refill noted on evaluation today which is excellent news. With that being said she does have discomfort  but mainly at the site of the wounds especially lateral foot wound but nothing like what she was experiencing previously. This is rated to be a 3/10 at most. She is seen with her daughter present. 12/12/17 on evaluation today patient appears to be doing very well in regard to her right foot and right medial great toe ulcers. She has been tolerating the dressing changes without complication. She doesn't seem to have any significant discomfort which is also good news. Overall she has been doing excellent since her vascular intervention in my opinion. She actually does have follow-up later today with vascular for a repeat arterial study to ensure everything seems to be doing well following her surgery. 12/19/17 on evaluation today patient appears to be doing excellent in regard to her lower extremity ulcers. In fact the right medial great toe ulcer has completely resolved at this point which is excellent news. The right lateral foot ulcer seems to be showing signs of improving week by week and obviously this is good news as well. Overall I'm very pleased with the progress that she has made up to this point. She does have some Slough covering the lateral foot wound which does require sharp debridement today. I did review her vascular note from Dr. Dellis Filbert and it appears that everything seems to be doing well from a vascular standpoint and  the patient's foot continues to be warm and appears well vascularized. 01/02/18 on evaluation today patient appears to be doing well in regard to her right lateral foot ulcer. She has been tolerating the dressing changes without complication. There does not appear to be any evidence of infection at this time which is also good news. She has definite new granulation noted. 01/16/18 on evaluation today patient appears to be doing very well. She has been tolerating the dressing changes without complication she has good epithelialization noted at this point. There is no evidence  of infection. Overall I'm extremely happy with the progress she seems to be making. 01/30/18 on evaluation today patient presents for follow-up concerning her right lateral foot ulcer. She has been tolerating the dressing changes without complication. There does not appear to be any evidence of infection which is good news. Overall I am definitely pleased with the progress she seems to have made. Patient's daughter is likewise also happy. Nonetheless she is having some issues with nighttime pain previously I had prescribed for her tramadol although she has not had this recently she has been out. This seems to be one reason why her pain at night has been a little worse. Nonetheless fortunately taking one tramadol at bedtime seems to help with this and she's able to sleep much better. 02/13/18 on evaluation today patient appears to be doing very well in regard to her right lateral foot ulcer. She is making progress albeit slow in regard to the healing of this wound she still seems to have a warm foot and toes which is great news. Obviously in the beginning she had a lot of vascular issues. Overall I'm pleased. 02/27/18 on evaluation today patient appears to be doing very well in regard to her right lateral foot ulcer. Patient's foot does not seem to have any pain she states that's excellent in that regard. Unfortunately it's just taking a very long time to heal but fortunately is making good progress. 03/13/18 on evaluation today patient appears to be doing excellent in regard to her right lateral foot ulcer. In fact at this point this appears to be completely healed which is excellent news. Overall she's having no pain and they have not noted any discharge or drainage. Patient History Information obtained from Patient. Family History CASON, DABNEY ([id number]) Diabetes - Mother, Heart Disease - Siblings, Stroke - Siblings, No family history of Cancer, Hereditary Spherocytosis, Hypertension, Kidney  Disease, Lung Disease, Seizures, Thyroid Problems, Tuberculosis. Social History Never smoker, Marital Status - Widowed, Alcohol Use - Never, Drug Use - No History, Caffeine Use - Daily. Review of Systems (ROS) Constitutional Symptoms (General Health) Denies complaints or symptoms of Fever, Chills. Psychiatric The patient has no complaints or symptoms. Objective Constitutional Well-nourished and well-hydrated in no acute distress. Vitals Time Taken: 11:13 AM, Height: 64 in, Weight: 156 lbs, BMI: 26.8, Temperature: 98.5 F, Pulse: 76 bpm, Respiratory Rate: 16 breaths/min, Blood Pressure: 146/55 mmHg. Respiratory normal breathing without difficulty. Psychiatric this patient is able to make decisions and demonstrates good insight into disease process. Alert and Oriented x 3. pleasant and cooperative. General Notes: Overall on inspection of the wound bed I see no evidence of an open wound at this point. She did have a little bit of dry skin on the medial right first toe this was easily removed without complication. I believe she is ready for discharge. Integumentary (Hair, Skin) Wound #7 status is Healed - Epithelialized. Original cause of wound was Gradually Appeared. The wound is  located on the Right,Dorsal Foot. The wound measures 0cm length x 0cm width x 0cm depth; 0cm^2 area and 0cm^3 volume. There is no tunneling or undermining noted. There is a none present amount of drainage noted. The wound margin is epibole. There is no granulation within the wound bed. There is no necrotic tissue within the wound bed. The periwound skin appearance did not exhibit: Callus, Crepitus, Excoriation, Induration, Rash, Scarring, Dry/Scaly, Maceration, Atrophie Blanche, Cyanosis, Ecchymosis, Hemosiderin Staining, Mottled, Pallor, Rubor, Erythema. Periwound temperature was noted as No Abnormality. 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 Please call our office if you have any questions or concerns. I am going to suggest currently that we go ahead and initiate treatment we just a protective dressing over the next couple of weeks. The patient states she may want to just keep a protective dressing such as a Band-Aid on this for a longer extended period of time I'm okay with that as well. My big thing is I do not want to end up with this reopening because of friction in her socket shoe. The patient is in agreement. We will see her back for a reevaluation in the future as needed if anything changes or worsens. Electronic Signature(s) Signed: 03/14/2018 8:38:06 AM By: Worthy Keeler PA-C Entered By: Worthy Keeler on 03/14/2018 02:08:58 [name] ([id number]) -------------------------------------------------------------------------------- ROS/PFSH Details Patient Name: [name] Date of Service: 03/13/2018 11:00 AM Medical Record Number: [id number] Patient Account Number: [id number] Date of Birth/Sex: [birth year] (84 y.o. F) Treating RN: Ahmed Prima Primary Care Provider: PATIENT, NO Other Clinician: Referring Provider: Referral, Self Treating Provider/Extender: STONE III,  Weeks in Treatment: 20 Information Obtained From Patient Wound History Do you currently have one or more open woundso Yes How many open wounds do you currently haveo 1 Approximately how long have you had your woundso 1.5 weeks How have you been treating your wound(s) until nowo bandaide Has your wound(s) ever healed and then re-openedo No Have you had any lab work done in the past montho No Have you tested  positive for an antibiotic resistant organism (MRSA, VRE)o No Have you tested positive for osteomyelitis (bone infection)o No Have you had any tests for circulation on your legso Yes Where was the test doneo [facility] Have you had other problems associated with your woundso Swelling Constitutional Symptoms (General Health) Complaints and Symptoms: Negative for: Fever; Chills Eyes Medical History: Positive for: Cataracts - had surgery Cardiovascular Medical History: Positive for: Hypertension Musculoskeletal Medical History: Positive for: Osteoarthritis Oncologic Medical History: Negative for: Received Chemotherapy; Received Radiation Psychiatric Complaints and Symptoms: No Complaints or Symptoms HBO Extended History Items Eyes: Cataracts Schindel, Loretta ([id number]) Immunizations Pneumococcal Vaccine: Received Pneumococcal Vaccination: Yes Implantable Devices Family and Social History Cancer: No; Diabetes: Yes - Mother; Heart Disease: Yes - Siblings; Hereditary Spherocytosis: No; Hypertension: No; Kidney Disease: No; Lung Disease: No; Seizures: No; Stroke: Yes - Siblings; Thyroid Problems: No; Tuberculosis: No; Never smoker; Marital Status - Widowed; Alcohol Use: Never; Drug Use: No History; Caffeine Use: Daily; Financial Concerns: No; Food, Clothing or Shelter Needs: No; Support System Lacking: No; Transportation Concerns: No; Advanced Directives: No; Patient does not want information  on Advanced Directives; Do not resuscitate: No; Living Will: No; Medical Power of Attorney: No Physician Affirmation I have reviewed and agree with the above information. Electronic Signature(s) Signed: 03/14/2018 8:38:06 AM By: Worthy Keeler PA-C Signed: 03/15/2018 2:43:37 PM By: Alric Quan Entered By: Worthy Keeler on 03/14/2018 02:07:33 [name] ([id number]) -------------------------------------------------------------------------------- SuperBill Details Patient Name:  [name] Date of Service: 03/13/2018 Medical Record Number: [id number] Patient Account Number: [id number] Date of Birth/Sex: 12/08/1918 (84 y.o. 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 L97.512 Non-pressure chronic ulcer of other part of right foot with fat layer exposed I10 Essential (primary) hypertension Facility Procedures CPT4 Code: 10626948 Description: 54627 - WOUND CARE VISIT-LEV 2 EST PT Modifier: Quantity: 1 Physician Procedures CPT4 Code Description: [id number] 81829 - WC PHYS LEVEL 2 - EST PT ICD-10 Diagnosis Description I70.235 Atherosclerosis of native arteries of right leg with ulceration o I70.232 Atherosclerosis of native arteries of right leg with ulceration o L97.512  Non-pressure chronic ulcer of other part of right foot with fat l I10 Essential (primary) hypertension Modifier: f other part of fo f calf ayer exposed Quantity: 1 ot Electronic Signature(s) Signed: 03/14/2018 8:38:06 AM By: Worthy Keeler PA-C Entered By: Worthy Keeler on 03/14/2018 02:09:14

## 2018-03-16 NOTE — Progress Notes (Signed)
This encounter was created in error - please disregard.

## 2018-04-10 ENCOUNTER — Ambulatory Visit: Payer: Medicare PPO | Admitting: Cardiovascular Disease

## 2018-04-10 ENCOUNTER — Encounter

## 2018-04-10 ENCOUNTER — Encounter: Payer: Self-pay | Admitting: Cardiovascular Disease

## 2018-04-10 VITALS — BP 148/70 | HR 94 | Ht 64.0 in | Wt 136.0 lb

## 2018-04-10 DIAGNOSIS — E785 Hyperlipidemia, unspecified: Secondary | ICD-10-CM | POA: Diagnosis not present

## 2018-04-10 DIAGNOSIS — I739 Peripheral vascular disease, unspecified: Secondary | ICD-10-CM

## 2018-04-10 DIAGNOSIS — I480 Paroxysmal atrial fibrillation: Secondary | ICD-10-CM

## 2018-04-10 DIAGNOSIS — I10 Essential (primary) hypertension: Secondary | ICD-10-CM

## 2018-04-10 NOTE — Progress Notes (Signed)
Cardiology Office Note   Date:  04/10/2018   ID:  [name], DOB Mar 08, 1919, MRN [id number]  PCP:  Patient, No Pcp Per  Cardiologist:   [name], MD   Chief Complaint  Patient presents with  . other    1 month follow up. Meds reviewed by the pt.'s daughter. "doing well."       History of Present Illness: [name] is a 84 y.o. female who Is here today for a follow-up visit regarding peripheral arterial disease. The patient has no previous cardiac history and no history of diabetes or tobacco use. She is known to have hypertension, hyperlipidemia and hypothyroidism.   She was seen in 2017 for nonhealing wounds on the left foot.  Noninvasive vascular evaluation showed an ABI of 0.55 on the right and 0.40 on the left. Duplex showed relatively long occlusion of the mid to distal left SFA with two-vessel runoff below the knee. Angiography in August, 2017 showed no significant aortoiliac disease. There was long occlusion of the left SFA with 1 vessel runoff below the knee via the peroneal artery which was occluded but gave collaterals to the dorsalis pedis. I performed successful angioplasty of the left SFA followed by drug-coated balloon angioplasty and spot stenting with self-expanding stent in the midsegment. The ulcers on the left foot has healed completely.   She was seen in 2018 for  nonhealing ulceration on the right foot with severe rest pain.  She has a deep ulcer on the right lateral foot as well as a superficial ulcer on the big toe.   She underwent a vascular studies which showed severely reduced ABI on the right 0.4 with evidence of high-grade stenosis in the right distal SFA or occlusion. I proceeded with angiography in February 2019 which showed moderate calcified disease involving the proximal and mid SFA followed by short occlusion distally with moderate disease in the popliteal artery and one-vessel runoff below the knee via the peroneal artery with severe  disease involving the TP trunk and proximal peroneal artery.  I performed successful orbital atherectomy and drug-coated balloon angioplasty to the right SFA, TP trunk and proximal peroneal artery. Postprocedure ABI improved to 0.79 on the right.  However, there was persistent to severely elevated velocity in the mid SFA at 546. She did have a generalized rash one day after the procedure which was thought to be due to delayed contrast reaction.  She was given short course of prednisone with resolution.  The wounds on the right side have healed completely and she was discharged from the wound clinic.  She is actually doing well with no leg pain or ulceration.  She walks slowly with a walker.  Past Medical History:  Diagnosis Date  . Arthritis    "legs" (11/22/2017)  . Breast cancer, right breast ([place])    mastectomy 30 yr ago  . DVT (deep venous thrombosis) ([place])    "found an old clot in her ?left leg when they were doing vascular studies; put her on ASA" (05/18/2016)  . Family history of adverse reaction to anesthesia    "all the family get PONV" (05/18/2016)  . High cholesterol   . Hypertension   . Hypothyroidism   . kidney calculi   . PAD (peripheral artery disease) ([place])   . Pneumonia 1990s  . PONV (postoperative nausea and vomiting)   . Shortness of breath     Past Surgical History:  Procedure Laterality Date  . ABDOMINAL AORTOGRAM W/LOWER EXTREMITY N/A 11/22/2017  Procedure: ABDOMINAL AORTOGRAM W/LOWER EXTREMITY;  Surgeon: Wellington Hampshire, MD;  Location: [facility];  Service: Cardiovascular;  Laterality: N/A;  . BALLOON ANGIOPLASTY, ARTERY Left 05/18/2016   SFA/notes 05/18/2016  . BREAST BIOPSY Right   . CATARACT EXTRACTION W/ INTRAOCULAR LENS  IMPLANT, BILATERAL Bilateral 2014  . CYSTOSCOPY W/ LITHOLAPAXY / [facility]    . CYSTOSCOPY W/ URETERAL STENT PLACEMENT  01/17/2012   Procedure: CYSTOSCOPY WITH RETROGRADE PYELOGRAM/URETERAL STENT PLACEMENT;  Surgeon: Franchot Gallo, MD;   Location: [facility];  Service: Urology;  Laterality: Left;  . CYSTOSCOPY W/ URETERAL STENT PLACEMENT  11/10/2010   Archie Endo 11/10/2010  . CYSTOSCOPY W/ URETERAL STENT REMOVAL  11/22/2010   Archie Endo 11/22/2010  . FEMORAL ENDARTERECTOMY    . FRACTURE SURGERY    . MASTECTOMY COMPLETE / SIMPLE Right   . PERIPHERAL VASCULAR ATHERECTOMY Right 11/22/2017   SFA/notes 11/22/2017  . PERIPHERAL VASCULAR ATHERECTOMY Right 11/22/2017   Procedure: PERIPHERAL VASCULAR ATHERECTOMY;  Surgeon: Wellington Hampshire, MD;  Location: [facility];  Service: Cardiovascular;  Laterality: Right;  . PERIPHERAL VASCULAR CATHETERIZATION N/A 05/18/2016   Procedure: Abdominal Aortogram w/Lower Extremity;  Surgeon: Wellington Hampshire, MD;  Location: [facility];  Service: Cardiovascular;  Laterality: N/A;  . WRIST FRACTURE SURGERY Left      Current Outpatient Medications  Medication Sig Dispense Refill  . acetaminophen (TYLENOL) 500 MG tablet Take 1,000 mg by mouth every 6 (six) hours as needed for moderate pain or headache.    Marland Kitchen amLODipine (NORVASC) 5 MG tablet Take 5 mg by mouth at bedtime.     Marland Kitchen aspirin EC 81 MG EC tablet Take 1 tablet (81 mg total) by mouth daily.    Marland Kitchen atorvastatin (LIPITOR) 20 MG tablet Take 1 tablet (20 mg total) by mouth daily at 6 PM. 30 tablet 1  . cetirizine (ZYRTEC) 10 MG tablet Take 10 mg by mouth daily as needed for allergies.     Marland Kitchen clopidogrel (PLAVIX) 75 MG tablet Take 1 tablet (75 mg total) by mouth daily with breakfast. 30 tablet 5  . Cyanocobalamin (VITAMIN B-12 PO) Take 1 tablet by mouth every other day.    . levothyroxine (SYNTHROID, LEVOTHROID) 50 MCG tablet Take 50 mcg by mouth daily with breakfast.     . losartan (COZAAR) 50 MG tablet Take 50 mg by mouth at bedtime.     . Multiple Vitamins-Minerals (EYE VITAMINS PO) Take 1 capsule by mouth daily.    Vladimir Faster Glycol-Propyl Glycol (SYSTANE OP) Place 1 drop into both eyes daily as needed (for dry eyes).    . traMADol (ULTRAM) 50 MG  tablet Take 50 mg by mouth at bedtime.     . Wound Dressings (PROMOGRAN EX) Apply topically. For foot wound     No current facility-administered medications for this visit.     Allergies:   Codeine and Contrast media [iodinated diagnostic agents]    Social History:  The patient  reports that she has never smoked. She has never used smokeless tobacco. She reports that she does not drink alcohol or use drugs.   Family History:  The patient's Family history is remarkable for coronary artery disease.   ROS:  Please see the history of present illness.   Otherwise, review of systems are positive for none.   All other systems are reviewed and negative.    PHYSICAL EXAM: VS:  BP (!) 148/70 (BP Location: Left Arm, Patient Position: Sitting, Cuff Size: Normal)   Pulse  94   Ht 5\' 4"  (1.626 m)   Wt 136 lb (61.7 kg)   BMI 23.34 kg/m  , BMI Body mass index is 23.34 kg/m. GEN: Well nourished, well developed, in no acute distress  HEENT: normal  Neck: no JVD, carotid bruits, or masses Cardiac: Irregularly irregular; no murmurs, rubs, or gallops,no edema  Respiratory:  clear to auscultation bilaterally, normal work of breathing GI: soft, nontender, nondistended, + BS MS: no deformity or atrophy  Skin: warm and dry, no rash Neuro:  Strength and sensation are intact Psych: euthymic mood, full affect Vascular: Femoral pulses are normal bilaterally.  No ulcerations on the foot.  EKG:  EKG is  ordered today. EKG showed normal sinus rhythm with incomplete right bundle branch block, LVH with repolarization abnormalities, left anterior fascicular block.  Recent Labs: 11/14/2017: Hemoglobin 13.4; Platelets 326 11/23/2017: BUN 11; Creatinine, Ser 0.70; Potassium 3.8; Sodium 142    Lipid Panel No results found for: CHOL, TRIG, HDL, CHOLHDL, VLDL, LDLCALC, LDLDIRECT    Wt Readings from Last 3 Encounters:  04/10/18 136 lb (61.7 kg)  12/15/17 145 lb (65.8 kg)  11/23/17 152 lb 1.9 oz (69 kg)       Other studies Reviewed: Additional studies/ records that were reviewed today include: Office note from the wound center.    ASSESSMENT AND PLAN:  1.  Peripheral arterial disease with previous bilateral critical limb ischemia status post endovascular intervention on both sides. She currently has no claudication and all her wounds have healed.  Continue medical therapy. Repeat vascular studies in September.  2. Essential hypertension: Blood pressure is reasonably controlled  3. Hyperlipidemia: Continue treatment with atorvastatin.  4.  Paroxysmal atrial fibrillation: Overall burden seems to be very low.  Continue dual antiplatelet therapy for now without anticoagulation.  Disposition:   FU with me in 4 months  Signed,  [name], MD  04/10/2018 3:10 PM    [place]

## 2018-04-10 NOTE — Patient Instructions (Signed)
Medication Instructions: Your physician recommends that you continue on your current medications as directed. Please refer to the Current Medication list given to you today.  If you need a refill on your cardiac medications before your next appointment, please call your pharmacy.    Follow-Up: Your physician wants you to follow-up in 4 months with Dr. Fletcher Anon.    Thank you for choosing [facility] at [facility]!

## 2018-04-20 ENCOUNTER — Encounter (INDEPENDENT_AMBULATORY_CARE_PROVIDER_SITE_OTHER): Payer: Medicare PPO | Admitting: Ophthalmology

## 2018-04-20 DIAGNOSIS — H35033 Hypertensive retinopathy, bilateral: Secondary | ICD-10-CM | POA: Diagnosis not present

## 2018-04-20 DIAGNOSIS — H353231 Exudative age-related macular degeneration, bilateral, with active choroidal neovascularization: Secondary | ICD-10-CM | POA: Diagnosis not present

## 2018-04-20 DIAGNOSIS — I10 Essential (primary) hypertension: Secondary | ICD-10-CM

## 2018-04-20 DIAGNOSIS — H43813 Vitreous degeneration, bilateral: Secondary | ICD-10-CM

## 2018-06-09 IMAGING — CR DG ANKLE COMPLETE 3+V*L*
1 series · 3 of 3 positions shown · non-contrast
Comparison: No prior.

CLINICAL DATA: Nonhealing wound.  Pain and swelling.

EXAM:
LEFT ANKLE COMPLETE - 3+ VIEW

[Series 1: dg ankle complete left · 0.14mm/px · 3 of 3 slices shown]
[im 1/3]
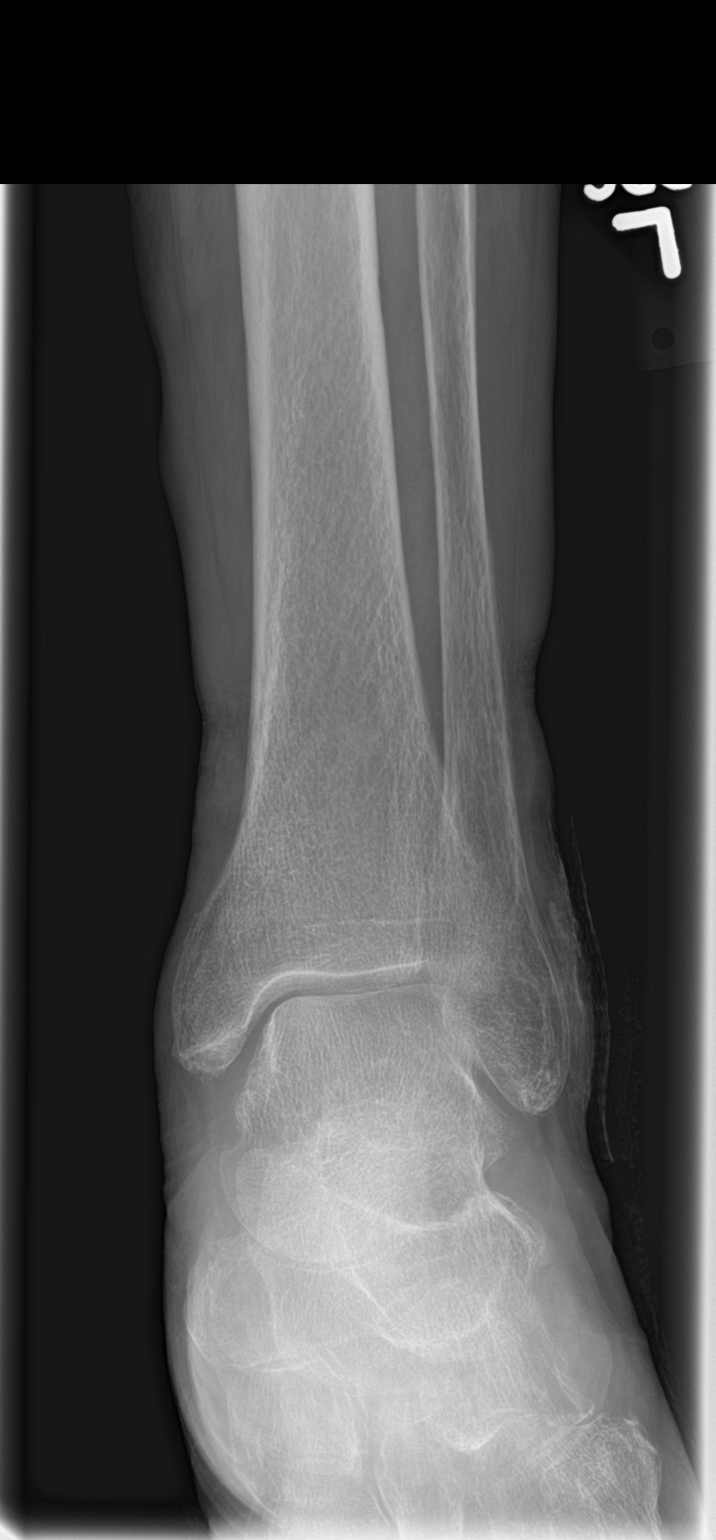
[im 2/3]
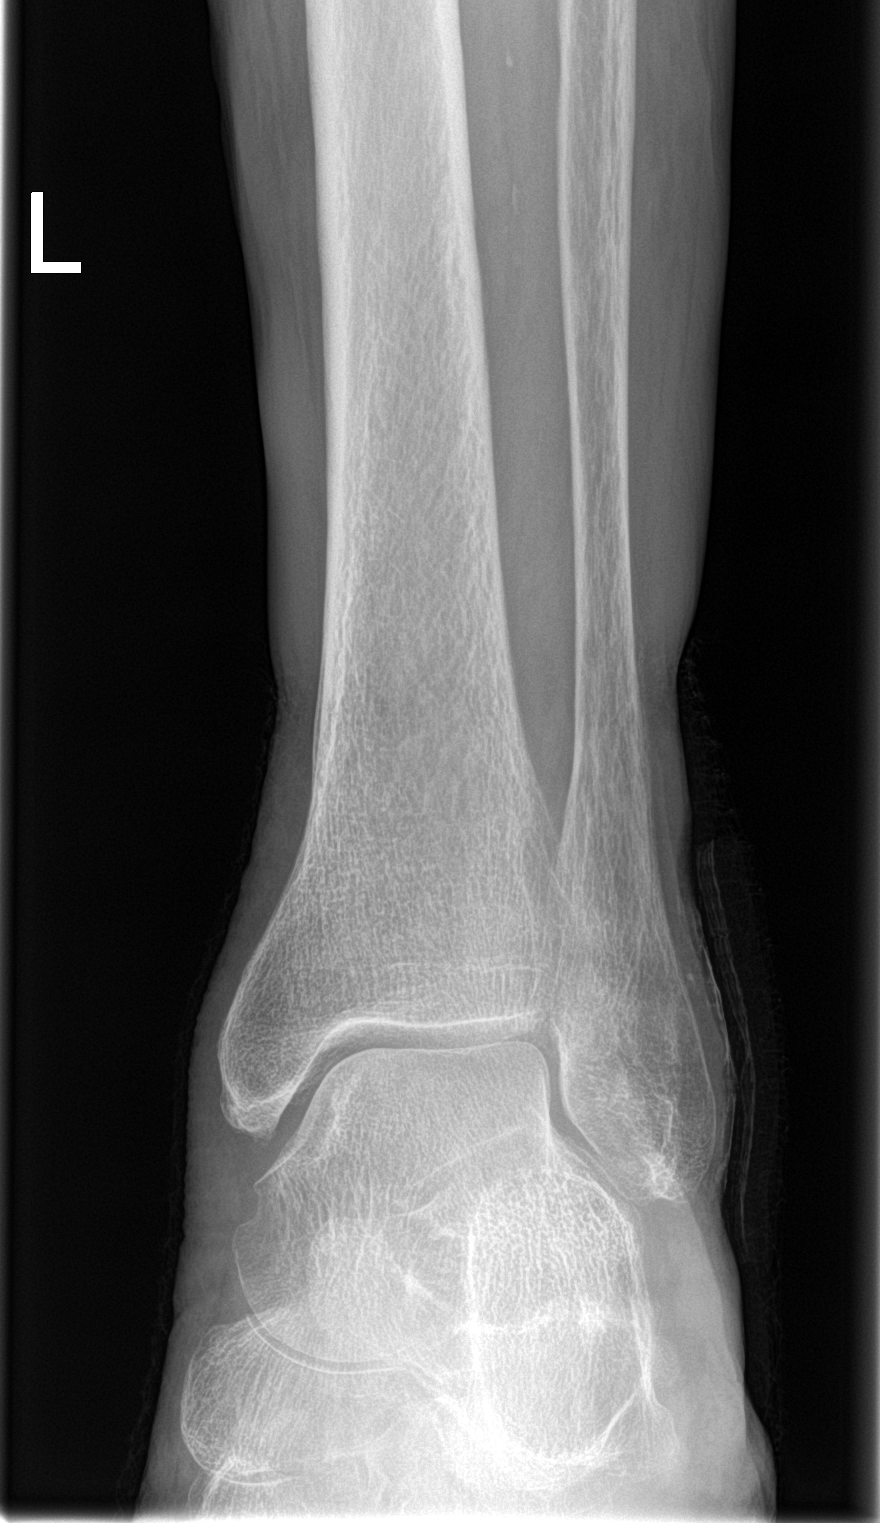
[im 3/3]
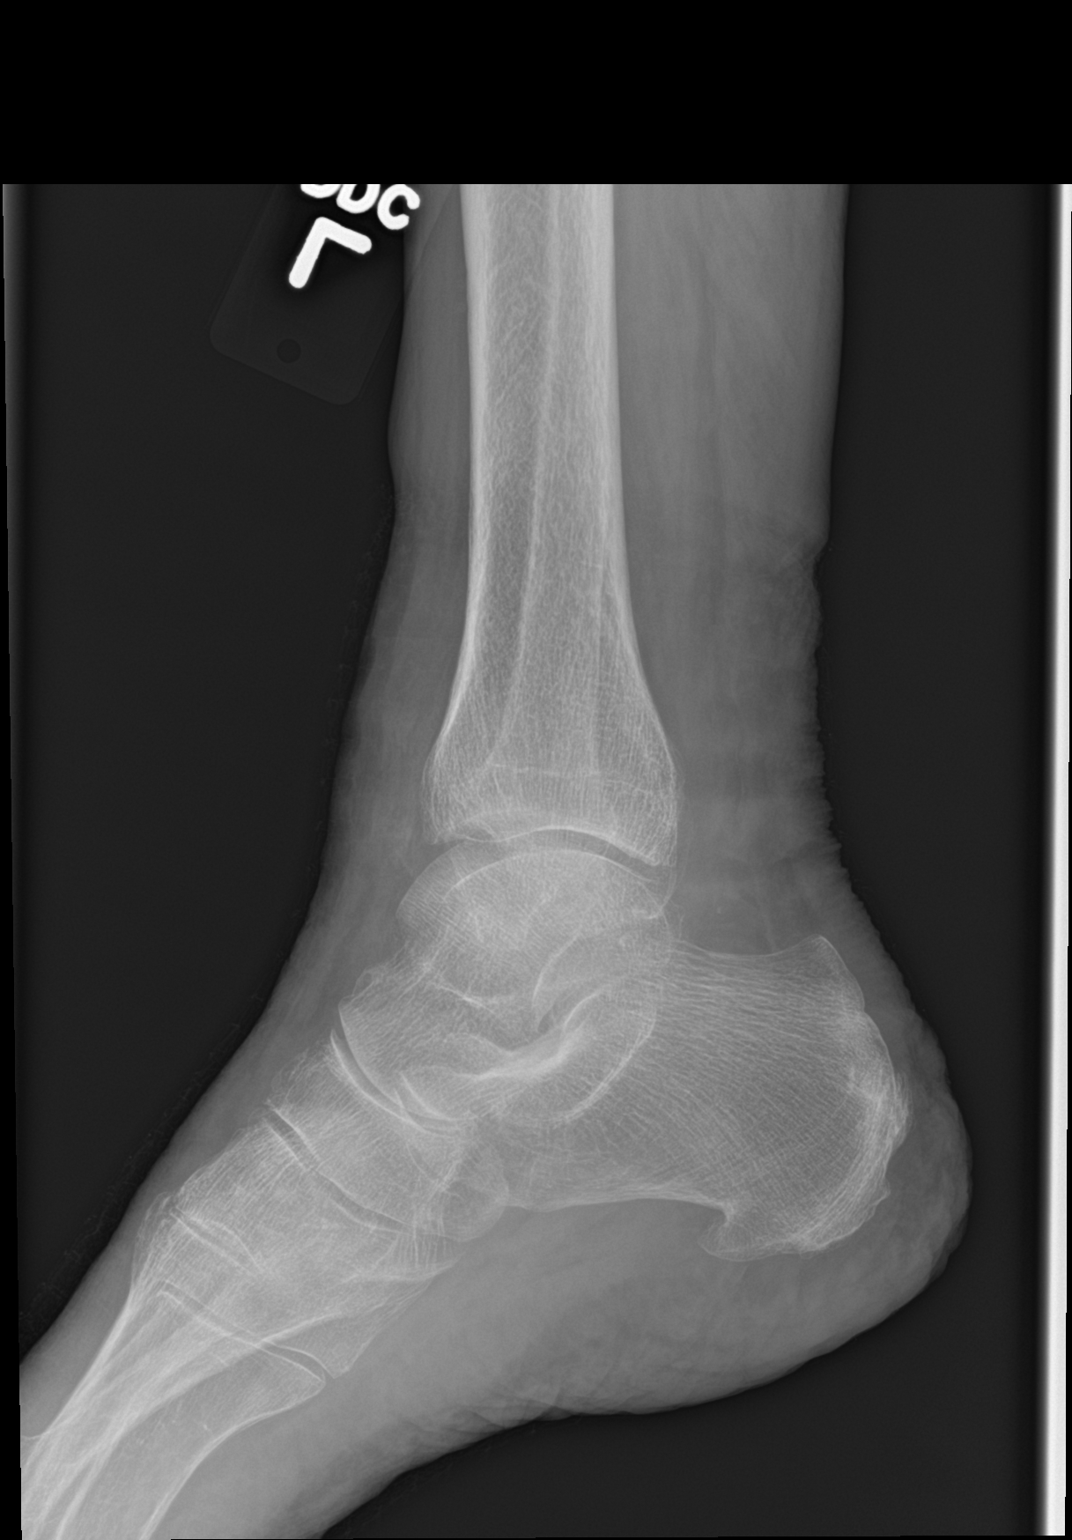

[3 of 3 positions shown; findings below may reference images not displayed]

FINDINGS: Soft tissue wound noted over the lateral malleolus. No evidence of
fracture dislocation. Diffuse osteopenia. Diffuse degenerative
change.
IMPRESSION: 1.  Soft tissue ulceration noted over the lateral malleolus.

2. Diffuse osteopenia and degenerative change. No acute bony
abnormality.

## 2018-06-09 IMAGING — CR DG FOOT COMPLETE 3+V*L*
1 series · 3 of 3 positions shown · non-contrast
Comparison: No recent prior.

CLINICAL DATA: Nonhealing wound.

EXAM:
LEFT FOOT - COMPLETE 3+ VIEW

[Series 1: dg foot complete left · 0.14mm/px · 3 of 3 slices shown]
[im 1/3]
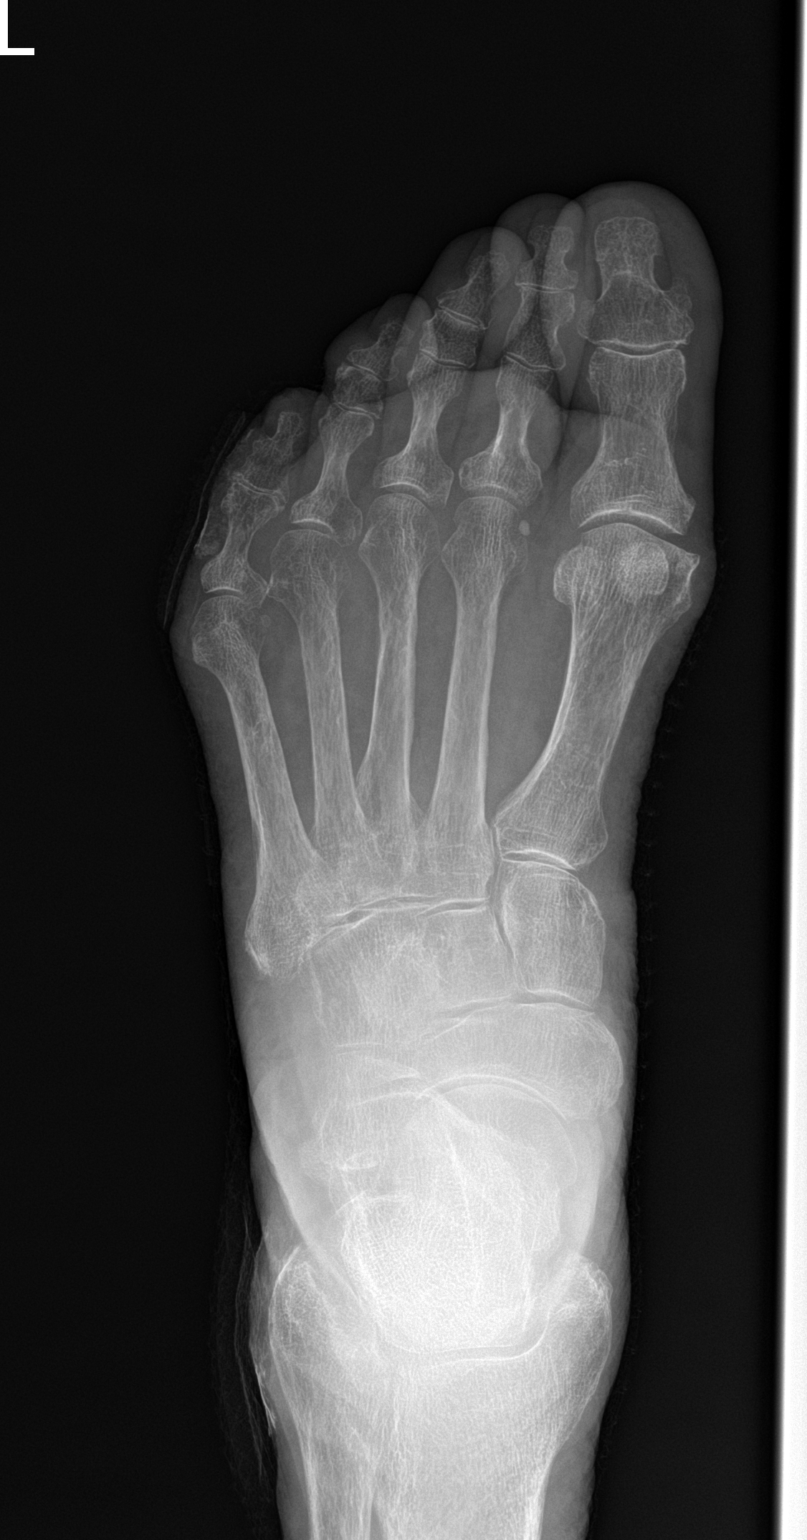
[im 2/3]
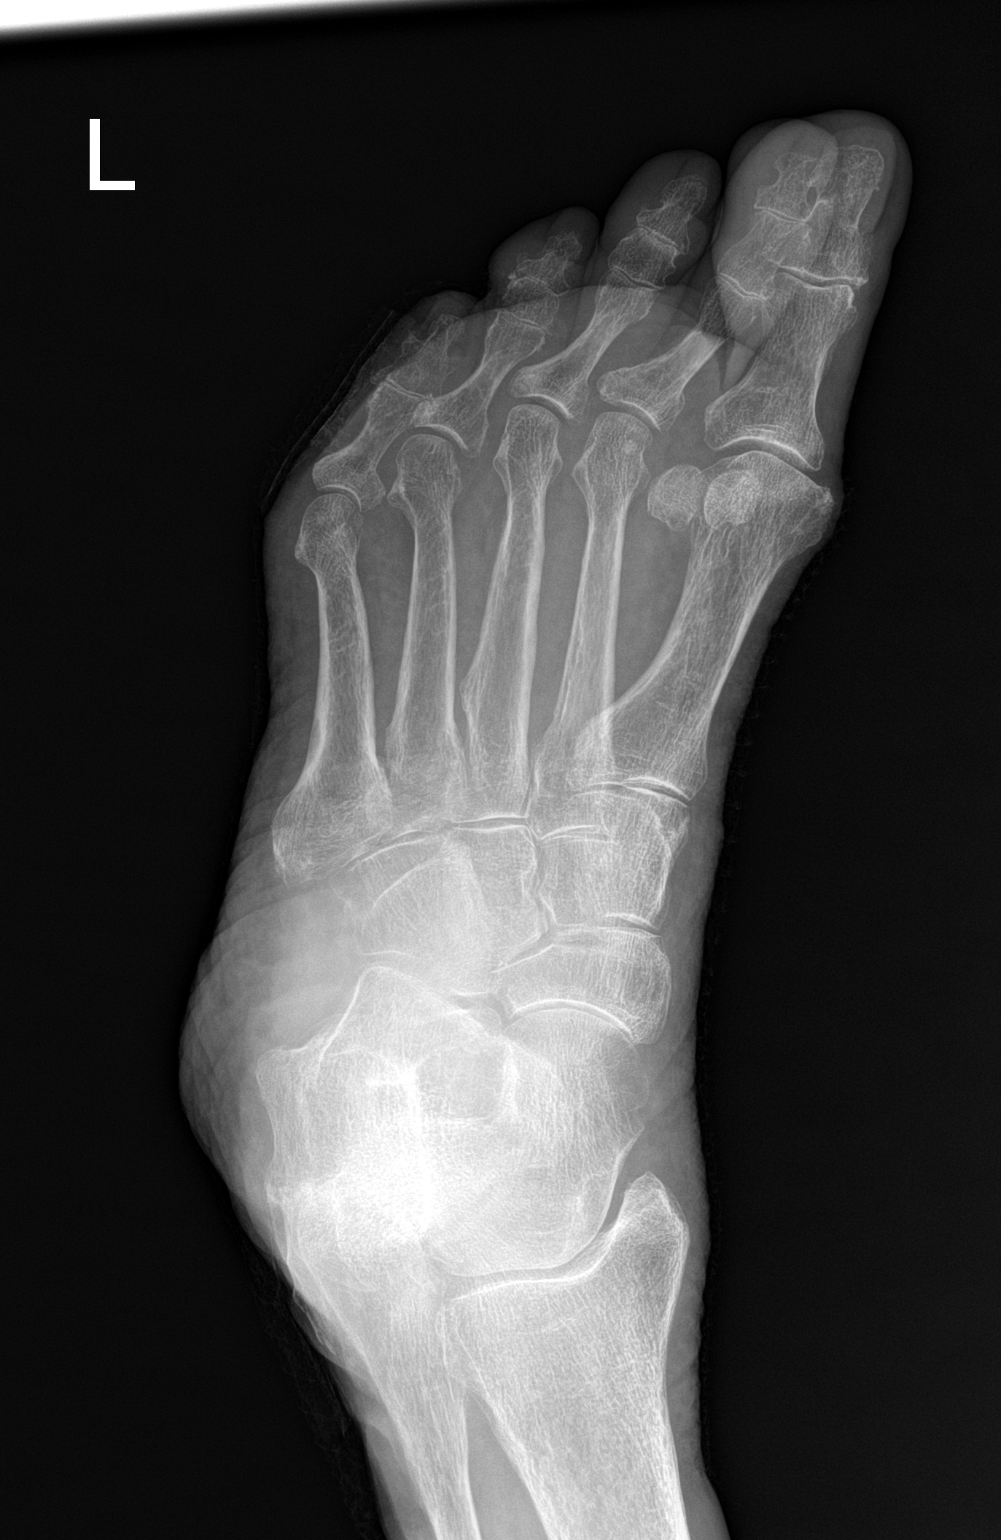
[im 3/3]
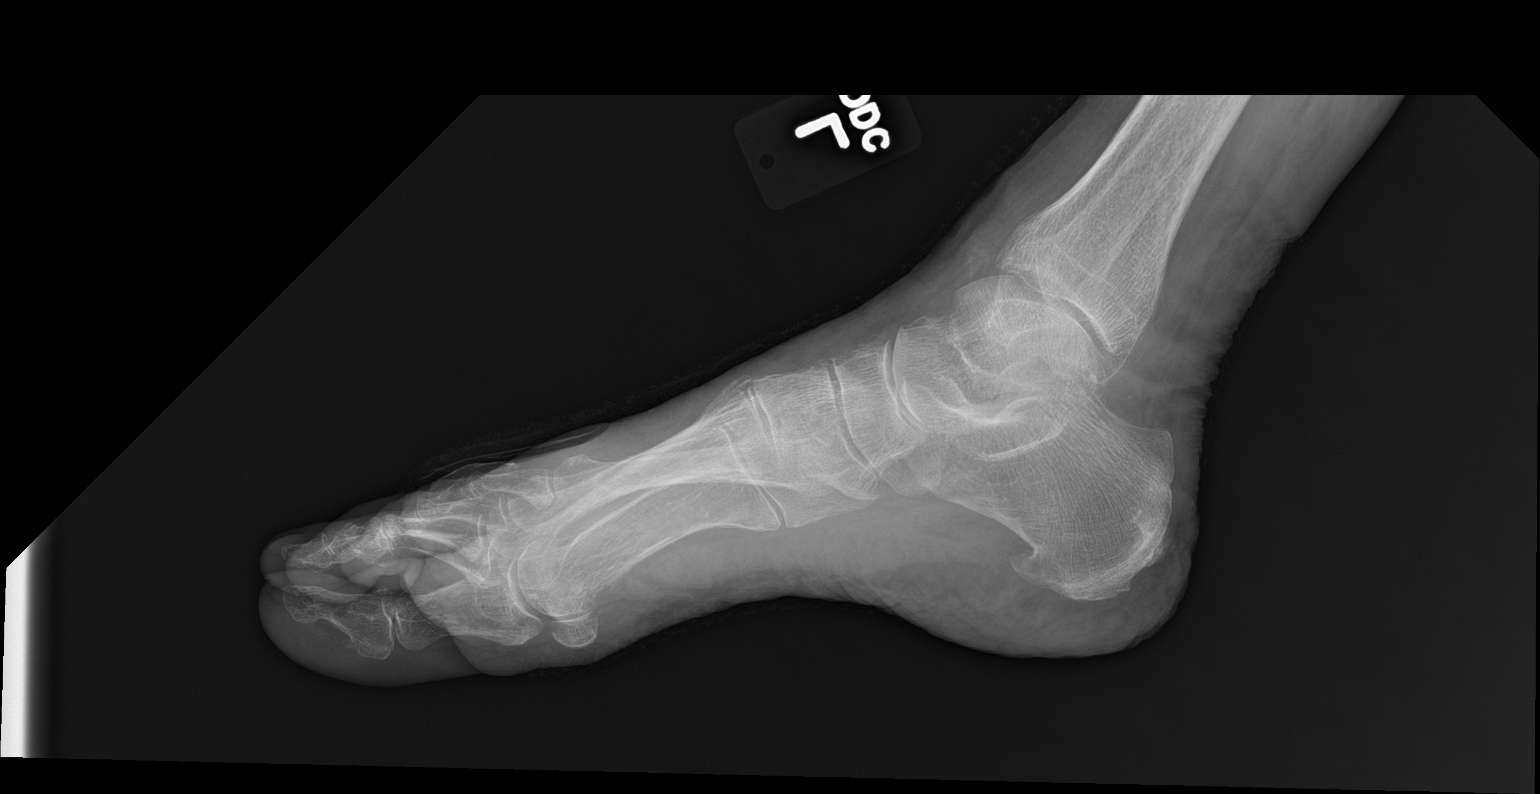

[3 of 3 positions shown; findings below may reference images not displayed]

FINDINGS: Soft tissue ulceration noted over the lateral malleolus. Dressing
noted. Dressing noted over the left fifth digit. Diffuse osteopenia
degenerative change. No acute bony abnormality identified. No
radiopaque foreign body .
IMPRESSION: Diffuse osteopenia degenerative change. No acute bony abnormality.
No radiopaque foreign body.

## 2018-06-21 ENCOUNTER — Ambulatory Visit (INDEPENDENT_AMBULATORY_CARE_PROVIDER_SITE_OTHER): Payer: Medicare PPO

## 2018-06-21 DIAGNOSIS — I739 Peripheral vascular disease, unspecified: Secondary | ICD-10-CM

## 2018-06-22 ENCOUNTER — Encounter (INDEPENDENT_AMBULATORY_CARE_PROVIDER_SITE_OTHER): Payer: Medicare PPO | Admitting: Ophthalmology

## 2018-06-22 DIAGNOSIS — H43813 Vitreous degeneration, bilateral: Secondary | ICD-10-CM

## 2018-06-22 DIAGNOSIS — I10 Essential (primary) hypertension: Secondary | ICD-10-CM

## 2018-06-22 DIAGNOSIS — H353231 Exudative age-related macular degeneration, bilateral, with active choroidal neovascularization: Secondary | ICD-10-CM

## 2018-06-22 DIAGNOSIS — H35033 Hypertensive retinopathy, bilateral: Secondary | ICD-10-CM | POA: Diagnosis not present

## 2018-06-26 ENCOUNTER — Telehealth: Payer: Self-pay | Admitting: *Deleted

## 2018-06-26 DIAGNOSIS — I739 Peripheral vascular disease, unspecified: Secondary | ICD-10-CM

## 2018-06-26 NOTE — Telephone Encounter (Signed)
Left a message for the patient to call back.  Repeat orders have been placed for one year.

## 2018-06-26 NOTE — Telephone Encounter (Signed)
Patient daughter returning our call ° °Please call back ° °

## 2018-06-26 NOTE — Telephone Encounter (Signed)
Patient's daughter made aware of results and verbalized understanding.

## 2018-06-26 NOTE — Telephone Encounter (Signed)
-----   Message from Wellington Hampshire, MD sent at 06/25/2018  2:04 PM EDT ----- Stable ABI and patent stents.  Repeat same studies in 1 year.

## 2018-08-24 ENCOUNTER — Encounter (INDEPENDENT_AMBULATORY_CARE_PROVIDER_SITE_OTHER): Payer: Medicare PPO | Admitting: Ophthalmology

## 2018-08-24 DIAGNOSIS — I10 Essential (primary) hypertension: Secondary | ICD-10-CM | POA: Diagnosis not present

## 2018-08-24 DIAGNOSIS — H353231 Exudative age-related macular degeneration, bilateral, with active choroidal neovascularization: Secondary | ICD-10-CM | POA: Diagnosis not present

## 2018-08-24 DIAGNOSIS — H43813 Vitreous degeneration, bilateral: Secondary | ICD-10-CM

## 2018-08-24 DIAGNOSIS — H35033 Hypertensive retinopathy, bilateral: Secondary | ICD-10-CM | POA: Diagnosis not present

## 2018-11-09 ENCOUNTER — Encounter (INDEPENDENT_AMBULATORY_CARE_PROVIDER_SITE_OTHER): Payer: Medicare PPO | Admitting: Ophthalmology

## 2018-11-09 DIAGNOSIS — H43813 Vitreous degeneration, bilateral: Secondary | ICD-10-CM | POA: Diagnosis not present

## 2018-11-09 DIAGNOSIS — H35033 Hypertensive retinopathy, bilateral: Secondary | ICD-10-CM

## 2018-11-09 DIAGNOSIS — H353231 Exudative age-related macular degeneration, bilateral, with active choroidal neovascularization: Secondary | ICD-10-CM

## 2018-11-09 DIAGNOSIS — I10 Essential (primary) hypertension: Secondary | ICD-10-CM | POA: Diagnosis not present

## 2019-01-25 ENCOUNTER — Other Ambulatory Visit: Payer: Self-pay

## 2019-01-25 ENCOUNTER — Encounter (INDEPENDENT_AMBULATORY_CARE_PROVIDER_SITE_OTHER): Payer: Medicare PPO | Admitting: Ophthalmology

## 2019-01-25 DIAGNOSIS — H43813 Vitreous degeneration, bilateral: Secondary | ICD-10-CM

## 2019-01-25 DIAGNOSIS — H35033 Hypertensive retinopathy, bilateral: Secondary | ICD-10-CM | POA: Diagnosis not present

## 2019-01-25 DIAGNOSIS — I10 Essential (primary) hypertension: Secondary | ICD-10-CM | POA: Diagnosis not present

## 2019-01-25 DIAGNOSIS — H353231 Exudative age-related macular degeneration, bilateral, with active choroidal neovascularization: Secondary | ICD-10-CM | POA: Diagnosis not present

## 2019-03-01 ENCOUNTER — Other Ambulatory Visit: Payer: Self-pay | Admitting: Urology

## 2019-03-11 ENCOUNTER — Telehealth: Payer: Self-pay

## 2019-03-11 NOTE — Progress Notes (Signed)
Virtual Visit via Telephone Note   This visit type was conducted due to national recommendations for restrictions regarding the COVID-19 Pandemic (e.g. social distancing) in an effort to limit this patient's exposure and mitigate transmission in our community.  Due to her co-morbid illnesses, this patient is at least at moderate risk for complications without adequate follow up.  This format is felt to be most appropriate for this patient at this time.  The patient did not have access to video technology/had technical difficulties with video requiring transitioning to audio format only (telephone).  All issues noted in this document were discussed and addressed.  No physical exam could be performed with this format.  Please refer to the patient's chart for her  consent to telehealth for [facility].   Date:  03/15/2019   ID:  [name], DOB [birth year], MRN [id number]  Patient Location: Home Provider Location: Home  PCP:  Patient, No Pcp Per  Cardiologist:  [name], MD  Electrophysiologist:  None   Evaluation Performed:  Follow-Up Visit  Chief Complaint:  Follow up  History of Present Illness:    [name] is a 85 y.o. female with PAF not on [place] as outlined below, PAD as outlined below, HTN, HLD, and hypothyroidism who presents for follow up of PAD.  She was seen in 2017 for nonhealing wounds on the left foot. Noninvasive vascular evaluation showed an ABI of 0.55 on the right and 0.40 on the left. Duplex showed relatively long occlusion of the mid to distal left SFA with two-vessel runoff below the knee. Angiography in 05/2016 showed no significant aortoiliac disease. There was long occlusion of the left SFA with one-vessel runoff below the knee via the peroneal artery which was occluded but gave collaterals to the dorsalis pedis. She underwent successful angioplasty of the left SFA followed by drug-coated balloon angioplasty and spot stenting with self-expanding stent in  the midsegment. Following this, she noted healing of the ulcers on the left foot. She was seen in 2018 for nonhealing ulceration on the right foot with severe rest pain.  She underwent a vascular studies which showed severely reduced ABI on the right 0.4 with evidence of high-grade stenosis in the right distal SFA or occlusion. Angiography in 11/2017 which showed moderate calcified disease involving the proximal and mid SFA followed by short occlusion distally with moderate disease in the popliteal artery and one-vessel runoff below the knee via the peroneal artery with severe disease involving the TP trunk and proximal peroneal artery. She underwent successful orbital atherectomy and drug-coated balloon angioplasty to the right SFA, TP trunk and proximal peroneal artery. Postprocedure ABI improved to 0.79 on the right.  However, there was persistent to severely elevated velocity in the mid SFA at 546. She was also noted to have a generalized rash one day after the procedure which was thought to be due to delayed contrast reaction that improved with a short course of prednisone. She was noted to be in asymptomatic new onset Afib in 11/2017 on EKG. She was not placed on [facility] given she was on DAPT with her severe PAD. She was last seen in the office in 04/2018 and noted the wounds on the right side had healed completely with subsequent discharge from the wound clinic. She was doing well with no leg pain or ulceration. Most recent lower extremity imaging from 06/2018 showed stable ABIs when compared to 12/2017 with a right side ABI of 0.79 (prior 0.79) and left side ABI of 0.76 (  prior 0.81), as well as patent stents. Follow up imaging was recommended in 06/2019.   She is seen in telemedicine follow up today. I spoke with her daughter as the patient is very hard of hearing. The patient has been doing very well since she was last seen by [place]. No ulcers or symptoms of claudication. She is tolerating DAPT without issues. No  chest pain, SOB, or palpitations. No falls, BRBPR or melena.   The patient does not have symptoms concerning for COVID-19 infection (fever, chills, cough, or new shortness of breath).    Past Medical History:  Diagnosis Date  . Arthritis    "legs" (11/22/2017)  . Breast cancer, right breast ([place])    mastectomy 30 yr ago  . DVT (deep venous thrombosis) ([place])    "found an old clot in her ?left leg when they were doing vascular studies; put her on ASA" (05/18/2016)  . Family history of adverse reaction to anesthesia    "all the family get PONV" (05/18/2016)  . High cholesterol   . Hypertension   . Hypothyroidism   . kidney calculi   . PAD (peripheral artery disease) ([place])   . Pneumonia 1990s  . PONV (postoperative nausea and vomiting)   . Shortness of breath    Past Surgical History:  Procedure Laterality Date  . ABDOMINAL AORTOGRAM W/LOWER EXTREMITY N/A 11/22/2017   Procedure: ABDOMINAL AORTOGRAM W/LOWER EXTREMITY;  Surgeon: Wellington Hampshire, MD;  Location: [place] [facility];  Service: Cardiovascular;  Laterality: N/A;  . BALLOON ANGIOPLASTY, ARTERY Left 05/18/2016   SFA/notes 05/18/2016  . BREAST BIOPSY Right   . CATARACT EXTRACTION W/ INTRAOCULAR LENS  IMPLANT, BILATERAL Bilateral 2014  . CYSTOSCOPY W/ LITHOLAPAXY / EHL    . CYSTOSCOPY W/ URETERAL STENT PLACEMENT  01/17/2012   Procedure: CYSTOSCOPY WITH RETROGRADE PYELOGRAM/URETERAL STENT PLACEMENT;  Surgeon: Franchot Gallo, MD;  Location: [facility];  Service: Urology;  Laterality: Left;  . CYSTOSCOPY W/ URETERAL STENT PLACEMENT  11/10/2010   Archie Endo 11/10/2010  . CYSTOSCOPY W/ URETERAL STENT REMOVAL  11/22/2010   Archie Endo 11/22/2010  . FEMORAL ENDARTERECTOMY    . FRACTURE SURGERY    . MASTECTOMY COMPLETE / SIMPLE Right   . PERIPHERAL VASCULAR ATHERECTOMY Right 11/22/2017   SFA/notes 11/22/2017  . PERIPHERAL VASCULAR ATHERECTOMY Right 11/22/2017   Procedure: PERIPHERAL VASCULAR ATHERECTOMY;  Surgeon: Wellington Hampshire, MD;  Location: [place] [facility];  Service: Cardiovascular;  Laterality: Right;  . PERIPHERAL VASCULAR CATHETERIZATION N/A 05/18/2016   Procedure: Abdominal Aortogram w/Lower Extremity;  Surgeon: Wellington Hampshire, MD;  Location: [place] [facility];  Service: Cardiovascular;  Laterality: N/A;  . WRIST FRACTURE SURGERY Left      Current Meds  Medication Sig  . acetaminophen (TYLENOL) 500 MG tablet Take 1,000 mg by mouth every 6 (six) hours as needed for moderate pain or headache.  Marland Kitchen amLODipine (NORVASC) 5 MG tablet Take 5 mg by mouth at bedtime.   Marland Kitchen aspirin EC 81 MG EC tablet Take 1 tablet (81 mg total) by mouth daily.  . cetirizine (ZYRTEC) 10 MG tablet Take 10 mg by mouth daily as needed for allergies.   . ciprofloxacin (CIPRO) 250 MG tablet Take 250 mg by mouth every other day.   . clopidogrel (PLAVIX) 75 MG tablet Take 1 tablet (75 mg total) by mouth daily with breakfast.  . CRANBERRY EXTRACT PO Take 1 tablet by mouth daily.  . Cyanocobalamin (VITAMIN B-12 PO) Take 1 tablet by mouth every other day.  Marland Kitchen  levothyroxine (SYNTHROID, LEVOTHROID) 50 MCG tablet Take 50 mcg by mouth daily with breakfast.   . losartan (COZAAR) 50 MG tablet Take 50 mg by mouth at bedtime.   . Multiple Vitamins-Minerals (OCUVITE PRESERVISION PO) Take 1 tablet by mouth daily.  Vladimir Faster Glycol-Propyl Glycol (SYSTANE OP) Place 1 drop into both eyes daily as needed (for dry eyes).     Allergies:   Codeine and Contrast media [iodinated diagnostic agents]   Social History   Tobacco Use  . Smoking status: Never Smoker  . Smokeless tobacco: Never Used  Substance Use Topics  . Alcohol use: No  . Drug use: No     Family Hx: The patient's family history is not on file.  ROS:   Please see the history of present illness.     All other systems reviewed and are negative.   Prior CV studies:   The following studies were reviewed today:  As above  Labs/Other Tests and Data Reviewed:    EKG:  No ECG reviewed.  Recent Labs:  No results found for requested labs within last 8760 hours.   Recent Lipid Panel No results found for: CHOL, TRIG, HDL, CHOLHDL, LDLCALC, LDLDIRECT  Wt Readings from Last 3 Encounters:  03/15/19 138 lb (62.6 kg)  04/10/18 136 lb (61.7 kg)  12/15/17 145 lb (65.8 kg)     Objective:    Vital Signs:  Ht 5\' 4"  (1.626 m)   Wt 138 lb (62.6 kg)   BMI 23.69 kg/m    VITAL SIGNS:  reviewed  ASSESSMENT & PLAN:    1. PAD: She is doing well without symptoms of claudication or ulcers. Continue DAPT with ASA and Plavix. I will reach out to her primary cardiologist to see if she should remain on Plavix, though given her extensive PAD I suspect she will need to remain on DAPT indefinitely, especially with her not having any falls or bleeding issues. She is scheduled for follow up noninvasive lower extremity imaging in 06/2019 and will keep this appointment unless there is concern for increased COVID-19 risk at that time.   2. PAF: Incidentally noted at visit in 11/2017. Noted to be in sinus rhythm at her most recent office visit with [place] in 04/2018. She has been maintained on DAPT rather than [place] given her burden of Afib appears to be low and in the setting of extensive PAD as above. No symptoms of palpitations.   3. HTN: No recent readings. Continue current therapy.   4. HLD: Remains on Lipitor.   COVID-19 Education: The signs and symptoms of COVID-19 were discussed with the patient and how to seek care for testing (follow up with PCP or arrange E-visit).  The importance of social distancing was discussed today.  Time:   Today, I have spent 10 minutes with the patient with telehealth technology discussing the above problems.     Medication Adjustments/Labs and Tests Ordered: Current medicines are reviewed at length with the patient today.  Concerns regarding medicines are outlined above.   Tests Ordered: No orders of the defined types were placed in this encounter.   Medication Changes: No  orders of the defined types were placed in this encounter.   Disposition:  Follow up in 6 month(s)  Signed, Christell Faith, PA-C  03/15/2019 10:23 AM    [place]

## 2019-03-11 NOTE — Telephone Encounter (Signed)
Virtual Visit Pre-Appointment Phone Call  "[name], I am calling you today to discuss your upcoming appointment. We are currently trying to limit exposure to the virus that causes COVID-19 by seeing patients at home rather than in the office."  1. "What is the BEST phone number to call the day of the visit?" - include this in appointment notes  2. Do you have or have access to (through a family member/friend) a smartphone with video capability that we can use for your visit?" a. If yes - list this number in appt notes as cell (if different from BEST phone #) and list the appointment type as a VIDEO visit in appointment notes b. If no - list the appointment type as a PHONE visit in appointment notes  3. Confirm consent - "In the setting of the current Covid19 crisis, you are scheduled for a phone visit with your provider on March 15, 2019 at 10:30AM.  Just as we do with many in-office visits, in order for you to participate in this visit, we must obtain consent.  If you'd like, I can send this to your mychart (if signed up) or email for you to review.  Otherwise, I can obtain your verbal consent now.  All virtual visits are billed to your insurance company just like a normal visit would be.  By agreeing to a virtual visit, we'd like you to understand that the technology does not allow for your provider to perform an examination, and thus may limit your provider's ability to fully assess your condition. If your provider identifies any concerns that need to be evaluated in person, we will make arrangements to do so.  Finally, though the technology is pretty good, we cannot assure that it will always work on either your or our end, and in the setting of a video visit, we may have to convert it to a phone-only visit.  In either situation, we cannot ensure that we have a secure connection.  Are you willing to proceed?" STAFF: Did the patient verbally acknowledge consent to telehealth visit? Document YES/NO  here: YES PER DAUGHTER [name]   4. Advise patient to be prepared - "Two hours prior to your appointment, go ahead and check your blood pressure, pulse, oxygen saturation, and your weight (if you have the equipment to check those) and write them all down. When your visit starts, your provider will ask you for this information. If you have an Apple Watch or Kardia device, please plan to have heart rate information ready on the day of your appointment. Please have a pen and paper handy nearby the day of the visit as well."  5. Give patient instructions for MyChart download to smartphone OR [email or web address] as below if video visit (depending on what platform provider is using)  6. Inform patient they will receive a phone call 15 minutes prior to their appointment time (may be from unknown caller ID) so they should be prepared to answer    TELEPHONE CALL NOTE  Matilde Pottenger has been deemed a candidate for a follow-up tele-health visit to limit community exposure during the Covid-19 pandemic. I spoke with the patient via phone to ensure availability of phone/video source, confirm preferred email & phone number, and discuss instructions and expectations.  I reminded Magdaline Platt to be prepared with any vital sign and/or heart rhythm information that could potentially be obtained via home monitoring, at the time of her visit. I reminded Valen Mascaro to expect a phone call prior  to her visit.  Rene Paci McClain 03/11/2019 11:04 AM    FULL LENGTH CONSENT FOR TELE-HEALTH VISIT   I hereby voluntarily request, consent and authorize [facility] and its employed or contracted physicians, physician assistants, nurse practitioners or other licensed health care professionals (the Practitioner), to provide me with telemedicine health care services (the Services") as deemed necessary by the treating Practitioner. I acknowledge and consent to receive the Services by the Practitioner via  telemedicine. I understand that the telemedicine visit will involve communicating with the Practitioner through live audiovisual communication technology and the disclosure of certain medical information by electronic transmission. I acknowledge that I have been given the opportunity to request an in-person assessment or other available alternative prior to the telemedicine visit and am voluntarily participating in the telemedicine visit.  I understand that I have the right to withhold or withdraw my consent to the use of telemedicine in the course of my care at any time, without affecting my right to future care or treatment, and that the Practitioner or I may terminate the telemedicine visit at any time. I understand that I have the right to inspect all information obtained and/or recorded in the course of the telemedicine visit and may receive copies of available information for a reasonable fee.  I understand that some of the potential risks of receiving the Services via telemedicine include:   Delay or interruption in medical evaluation due to technological equipment failure or disruption;  Information transmitted may not be sufficient (e.g. poor resolution of images) to allow for appropriate medical decision making by the Practitioner; and/or   In rare instances, security protocols could fail, causing a breach of personal health information.  Furthermore, I acknowledge that it is my responsibility to provide information about my medical history, conditions and care that is complete and accurate to the best of my ability. I acknowledge that Practitioner's advice, recommendations, and/or decision may be based on factors not within their control, such as incomplete or inaccurate data provided by me or distortions of diagnostic images or specimens that may result from electronic transmissions. I understand that the practice of medicine is not an exact science and that Practitioner makes no warranties or  guarantees regarding treatment outcomes. I acknowledge that I will receive a copy of this consent concurrently upon execution via email to the email address I last provided but may also request a printed copy by calling the office of [place].    I understand that my insurance will be billed for this visit.   I have read or had this consent read to me.  I understand the contents of this consent, which adequately explains the benefits and risks of the Services being provided via telemedicine.   I have been provided ample opportunity to ask questions regarding this consent and the Services and have had my questions answered to my satisfaction.  I give my informed consent for the services to be provided through the use of telemedicine in my medical care  By participating in this telemedicine visit I agree to the above.

## 2019-03-15 ENCOUNTER — Other Ambulatory Visit: Payer: Self-pay

## 2019-03-15 ENCOUNTER — Telehealth (INDEPENDENT_AMBULATORY_CARE_PROVIDER_SITE_OTHER): Payer: Medicare PPO | Admitting: Physician Assistant

## 2019-03-15 ENCOUNTER — Encounter: Payer: Self-pay | Admitting: Physician Assistant

## 2019-03-15 VITALS — Ht 64.0 in | Wt 138.0 lb

## 2019-03-15 DIAGNOSIS — I739 Peripheral vascular disease, unspecified: Secondary | ICD-10-CM

## 2019-03-15 DIAGNOSIS — I10 Essential (primary) hypertension: Secondary | ICD-10-CM | POA: Diagnosis not present

## 2019-03-15 DIAGNOSIS — I480 Paroxysmal atrial fibrillation: Secondary | ICD-10-CM | POA: Diagnosis not present

## 2019-03-15 DIAGNOSIS — E785 Hyperlipidemia, unspecified: Secondary | ICD-10-CM | POA: Diagnosis not present

## 2019-03-15 NOTE — Patient Instructions (Signed)
It was a pleasure to speak with you on the phone today! Thank you for allowing [place] to continue taking care of your [facility] needs during this time.   Feel free to call as needed for questions and concerns related to your cardiac needs.   Medication Instructions:  Your physician recommends that you continue on your current medications as directed. Please refer to the Current Medication list given to you today.  If you need a refill on your cardiac medications before your next appointment, please call your pharmacy.   Lab work: None ordered  If you have labs (blood work) drawn today and your tests are completely normal, you will receive your results only by: Marland Kitchen MyChart Message (if you have MyChart) OR . A paper copy in the mail If you have any lab test that is abnormal or we need to change your treatment, we will call you to review the results.  Testing/Procedures: 1- Lower extremity ultrasound is ordered, call for scheduling when available.  Follow-Up: At [facility], you and your health needs are our priority.  As part of our continuing mission to provide you with exceptional heart care, we have created designated Provider Care Teams.  These Care Teams include your primary Cardiologist (physician) and Advanced Practice Providers (APPs -  Physician Assistants and Nurse Practitioners) who all work together to provide you with the care you need, when you need it. You will need a follow up appointment in 6 months.  Please call our office 2 months in advance to schedule this appointment.  You may see [name], MD or Christell Faith, PA-C.

## 2019-03-18 ENCOUNTER — Other Ambulatory Visit: Payer: Self-pay | Admitting: Physician Assistant

## 2019-03-18 ENCOUNTER — Telehealth: Payer: Self-pay | Admitting: Physician Assistant

## 2019-03-18 NOTE — Telephone Encounter (Signed)
Spoke with the patient's primary cardiologist. We will discontinue ASA and maintain the patient on Plavix. Medications updated. Spoke with the patient's daughter, [name].

## 2019-04-26 ENCOUNTER — Other Ambulatory Visit: Payer: Self-pay

## 2019-04-26 ENCOUNTER — Encounter (INDEPENDENT_AMBULATORY_CARE_PROVIDER_SITE_OTHER): Payer: Medicare PPO | Admitting: Ophthalmology

## 2019-04-26 DIAGNOSIS — H353231 Exudative age-related macular degeneration, bilateral, with active choroidal neovascularization: Secondary | ICD-10-CM

## 2019-04-26 DIAGNOSIS — I10 Essential (primary) hypertension: Secondary | ICD-10-CM | POA: Diagnosis not present

## 2019-04-26 DIAGNOSIS — H43813 Vitreous degeneration, bilateral: Secondary | ICD-10-CM | POA: Diagnosis not present

## 2019-04-26 DIAGNOSIS — H35033 Hypertensive retinopathy, bilateral: Secondary | ICD-10-CM

## 2019-05-31 ENCOUNTER — Encounter (INDEPENDENT_AMBULATORY_CARE_PROVIDER_SITE_OTHER): Payer: Medicare PPO | Admitting: Ophthalmology

## 2019-05-31 ENCOUNTER — Other Ambulatory Visit: Payer: Self-pay

## 2019-05-31 DIAGNOSIS — I10 Essential (primary) hypertension: Secondary | ICD-10-CM | POA: Diagnosis not present

## 2019-05-31 DIAGNOSIS — H35033 Hypertensive retinopathy, bilateral: Secondary | ICD-10-CM

## 2019-05-31 DIAGNOSIS — H353231 Exudative age-related macular degeneration, bilateral, with active choroidal neovascularization: Secondary | ICD-10-CM | POA: Diagnosis not present

## 2019-05-31 DIAGNOSIS — H43813 Vitreous degeneration, bilateral: Secondary | ICD-10-CM

## 2019-07-05 ENCOUNTER — Encounter (INDEPENDENT_AMBULATORY_CARE_PROVIDER_SITE_OTHER): Payer: Medicare PPO | Admitting: Ophthalmology

## 2019-07-11 ENCOUNTER — Other Ambulatory Visit: Payer: Self-pay

## 2019-07-11 ENCOUNTER — Ambulatory Visit (INDEPENDENT_AMBULATORY_CARE_PROVIDER_SITE_OTHER): Payer: Medicare PPO

## 2019-07-11 DIAGNOSIS — I739 Peripheral vascular disease, unspecified: Secondary | ICD-10-CM

## 2019-07-15 ENCOUNTER — Telehealth: Payer: Self-pay

## 2019-07-15 NOTE — Telephone Encounter (Signed)
-----   Message from Wellington Hampshire, MD sent at 07/12/2019 11:22 AM EDT ----- Stable ABI on the right but did drop slightly on the left.  She should come for a routine follow-up visit with me in few months to ensure no issues with blood flow on the left.

## 2019-07-15 NOTE — Telephone Encounter (Signed)
Spoke with the patient's daughter [name]. [name] is aware of the patient's LE study results. [name] will look at her calendar and call back to schedule a f/u appt with Dr. Fletcher Anon for a few months.

## 2019-07-19 ENCOUNTER — Encounter (INDEPENDENT_AMBULATORY_CARE_PROVIDER_SITE_OTHER): Payer: Medicare PPO | Admitting: Ophthalmology

## 2019-07-19 ENCOUNTER — Other Ambulatory Visit: Payer: Self-pay

## 2019-07-19 DIAGNOSIS — H353231 Exudative age-related macular degeneration, bilateral, with active choroidal neovascularization: Secondary | ICD-10-CM

## 2019-07-19 DIAGNOSIS — I10 Essential (primary) hypertension: Secondary | ICD-10-CM

## 2019-07-19 DIAGNOSIS — H43813 Vitreous degeneration, bilateral: Secondary | ICD-10-CM

## 2019-07-19 DIAGNOSIS — H35033 Hypertensive retinopathy, bilateral: Secondary | ICD-10-CM | POA: Diagnosis not present

## 2019-08-23 ENCOUNTER — Encounter (INDEPENDENT_AMBULATORY_CARE_PROVIDER_SITE_OTHER): Payer: Medicare PPO | Admitting: Ophthalmology

## 2019-08-23 DIAGNOSIS — H353231 Exudative age-related macular degeneration, bilateral, with active choroidal neovascularization: Secondary | ICD-10-CM | POA: Diagnosis not present

## 2019-08-23 DIAGNOSIS — I10 Essential (primary) hypertension: Secondary | ICD-10-CM | POA: Diagnosis not present

## 2019-08-23 DIAGNOSIS — H43813 Vitreous degeneration, bilateral: Secondary | ICD-10-CM

## 2019-08-23 DIAGNOSIS — H35033 Hypertensive retinopathy, bilateral: Secondary | ICD-10-CM

## 2019-09-27 ENCOUNTER — Encounter (INDEPENDENT_AMBULATORY_CARE_PROVIDER_SITE_OTHER): Payer: Medicare PPO | Admitting: Ophthalmology

## 2019-09-27 ENCOUNTER — Other Ambulatory Visit: Payer: Self-pay

## 2019-09-27 DIAGNOSIS — I10 Essential (primary) hypertension: Secondary | ICD-10-CM

## 2019-09-27 DIAGNOSIS — H35033 Hypertensive retinopathy, bilateral: Secondary | ICD-10-CM | POA: Diagnosis not present

## 2019-09-27 DIAGNOSIS — H43813 Vitreous degeneration, bilateral: Secondary | ICD-10-CM | POA: Diagnosis not present

## 2019-09-27 DIAGNOSIS — H353231 Exudative age-related macular degeneration, bilateral, with active choroidal neovascularization: Secondary | ICD-10-CM | POA: Diagnosis not present

## 2019-10-18 ENCOUNTER — Telehealth: Payer: Self-pay | Admitting: Cardiovascular Disease

## 2019-10-18 NOTE — Telephone Encounter (Signed)

## 2019-10-25 ENCOUNTER — Encounter: Payer: Self-pay | Admitting: Physician Assistant

## 2019-10-25 ENCOUNTER — Telehealth (INDEPENDENT_AMBULATORY_CARE_PROVIDER_SITE_OTHER): Payer: Medicare PPO | Admitting: Physician Assistant

## 2019-10-25 VITALS — BP 146/68 | HR 77 | Ht 64.0 in | Wt 145.0 lb

## 2019-10-25 DIAGNOSIS — I10 Essential (primary) hypertension: Secondary | ICD-10-CM

## 2019-10-25 DIAGNOSIS — I739 Peripheral vascular disease, unspecified: Secondary | ICD-10-CM | POA: Diagnosis not present

## 2019-10-25 DIAGNOSIS — E039 Hypothyroidism, unspecified: Secondary | ICD-10-CM

## 2019-10-25 DIAGNOSIS — I480 Paroxysmal atrial fibrillation: Secondary | ICD-10-CM | POA: Diagnosis not present

## 2019-10-25 DIAGNOSIS — E785 Hyperlipidemia, unspecified: Secondary | ICD-10-CM

## 2019-10-25 NOTE — Progress Notes (Signed)
Virtual Visit via Telephone Note   This visit type was conducted due to national recommendations for restrictions regarding the COVID-19 Pandemic (e.g. social distancing) in an effort to limit this patient's exposure and mitigate transmission in our community.  Due to her co-morbid illnesses, this patient is at least at moderate risk for complications without adequate follow up.  This format is felt to be most appropriate for this patient at this time.  The patient did not have access to video technology/had technical difficulties with video requiring transitioning to audio format only (telephone).  All issues noted in this document were discussed and addressed.  No physical exam could be performed with this format.  Please refer to the patient's chart for her  consent to telehealth for [facility].   Date:  10/25/2019   ID:  [name], DOB 10/06/1919, MRN [id number]  Patient Location: Home Provider Location: Office  PCP:  Lorelee Market, MD  Cardiologist:  [name], MD  Electrophysiologist:  None   Evaluation Performed:  Follow-Up Visit  Chief Complaint: 86 year old female with PAF not on [place], PAD, hypertension, hyperlipidemia, and hypothyroidism, and who presents for follow-up of PAD.  History of Present Illness:    [name] is a 86 y.o. female with hsitory of PAF not on [place], PAD, HTN, HLD, and hypothyroidism, and who presents for follow-up of PAD.  She was seen in 2017 for nonhealing wounds on the left foot.  Angiography 05/2016 showed no significant aortoiliac disease.  There was a long occlusion of the left SFA with one-vessel runoff below the knee via the peroneal artery, which was occluded but gave collaterals to the dorsalis pedis.  She underwent successful angioplasty of the left SFA followed by drug-coated balloon angioplasty and spot stenting with self-expanding stent in the mid segment.  Following this, she noted healing of the ulcers of her left foot.  She  was seen in 2018 for nonhealing ulceration of the right foot severe rest pain.  Angiography 11/2017 showed moderate calcified disease, involving the proximal and mid SFA, followed by short occlusion distally with moderate disease in the popliteal artery and one-vessel runoff below the knee via the peroneal artery with severe disease involving the TP trunk and proximal peroneal artery.  She underwent successful orbital atherectomy and drug-coated balloon angioplasty to the right SFA, TP trunk, and proximal peroneal artery.  Post procedure ABI improved to 0.79 on the right.  There was persistent to severely elevated velocity in the mid SFA and she was noted to have a generalized rash 1 day after the procedure, which was thought to be due to to delayed contrast reaction that improved with a short course of prednisone.  She was noted to be asymptomatic and new onset A. fib 11/2017 on EKG.  She is not placed on [place], given she was on DAPT with her severe PAD.  She was seen in the office 04/2018 and noted wounds on the right side had healed completely with subsequent discharge from the wound clinic.  She was doing well with no leg pain or ulceration.  She underwent lower extremity imaging 06/2018 that showed stable ABIs when compared to 12/2017 with R ABI 0.79 (prior 0.79) and left side ABI 0.76 (prior 0.81), as well as patent stents.  Most recent imaging was performed 07/11/2019 and showed right ABIs and TBI's essentially unchanged when compared with 06/21/2018 study.  Left ABI/TBI's appeared decreased compared to the previous study with left ABI showing a drop of 0.15.  Most  recent ultrasound showed left 50 to 74% stenosis noted in the superficial femoral artery.  Patent stent with no evidence of stenosis in the proximal to mid SFA artery.  No significant change when compared with previous study.  No significant change in disease severity above the knee.  No waveform change seen along the length of the leg vessels.  There is  scant flow in the PTA and peroneal artery.  Given these results, recommendation was for a follow-up visit with Dr. Fletcher Anon to ensure no issues with blood flow on the left side, as well as follow-up imaging in 1 year.   Today's virtual visit is telephone only and performed by speaking with the daughter only, as the patient has been hard of hearing. Per daughter, the patient has been doing well since she was last seen. She denies any CP, SOB/DOE, racing HR, or palpitations. No recent presyncope or syncope. She reports that she is no longer taking ASA (as recommended via telephone note) but maintained on Plavix.  No s/sx consistent with bleeding. She denies any leg pain, color / temperature changes, or edema. No ulcers are noted or any reported symptoms concerning for claudication. She does note the occasional pain 2/2 knee arthritis; however, otherwise, the patient is reportedly doing very well and pleased with the results of the previous scan. Medication compliance reported.  The patient does not have symptoms concerning for COVID-19 infection (fever, chills, cough, or new shortness of breath).    Past Medical History:  Diagnosis Date  . Arthritis    "legs" (11/22/2017)  . Breast cancer, right breast ([place])    mastectomy 30 yr ago  . DVT (deep venous thrombosis) ([place])    "found an old clot in her ?left leg when they were doing vascular studies; put her on ASA" (05/18/2016)  . Family history of adverse reaction to anesthesia    "all the family get PONV" (05/18/2016)  . High cholesterol   . Hypertension   . Hypothyroidism   . kidney calculi   . PAD (peripheral artery disease) ([place])   . Pneumonia 1990s  . PONV (postoperative nausea and vomiting)   . Shortness of breath    Past Surgical History:  Procedure Laterality Date  . ABDOMINAL AORTOGRAM W/LOWER EXTREMITY N/A 11/22/2017   Procedure: ABDOMINAL AORTOGRAM W/LOWER EXTREMITY;  Surgeon: Wellington Hampshire, MD;  Location: [facility];  Service:  Cardiovascular;  Laterality: N/A;  . BALLOON ANGIOPLASTY, ARTERY Left 05/18/2016   SFA/notes 05/18/2016  . BREAST BIOPSY Right   . CATARACT EXTRACTION W/ INTRAOCULAR LENS  IMPLANT, BILATERAL Bilateral 2014  . CYSTOSCOPY W/ LITHOLAPAXY / [facility]    . CYSTOSCOPY W/ URETERAL STENT PLACEMENT  01/17/2012   Procedure: CYSTOSCOPY WITH RETROGRADE PYELOGRAM/URETERAL STENT PLACEMENT;  Surgeon: Franchot Gallo, MD;  Location: [facility];  Service: Urology;  Laterality: Left;  . CYSTOSCOPY W/ URETERAL STENT PLACEMENT  11/10/2010   Archie Endo 11/10/2010  . CYSTOSCOPY W/ URETERAL STENT REMOVAL  11/22/2010   Archie Endo 11/22/2010  . FEMORAL ENDARTERECTOMY    . FRACTURE SURGERY    . MASTECTOMY COMPLETE / SIMPLE Right   . PERIPHERAL VASCULAR ATHERECTOMY Right 11/22/2017   SFA/notes 11/22/2017  . PERIPHERAL VASCULAR ATHERECTOMY Right 11/22/2017   Procedure: PERIPHERAL VASCULAR ATHERECTOMY;  Surgeon: Wellington Hampshire, MD;  Location: [facility];  Service: Cardiovascular;  Laterality: Right;  . PERIPHERAL VASCULAR CATHETERIZATION N/A 05/18/2016   Procedure: Abdominal Aortogram w/Lower Extremity;  Surgeon: Wellington Hampshire, MD;  Location: [facility];  Service: Cardiovascular;  Laterality: N/A;  . WRIST FRACTURE SURGERY Left      Current Meds  Medication Sig  . acetaminophen (TYLENOL) 500 MG tablet Take 1,000 mg by mouth every 6 (six) hours as needed for moderate pain or headache.  Marland Kitchen amLODipine (NORVASC) 5 MG tablet Take 5 mg by mouth at bedtime.   Marland Kitchen atorvastatin (LIPITOR) 20 MG tablet Take 1 tablet (20 mg total) by mouth daily at 6 PM.  . cetirizine (ZYRTEC) 10 MG tablet Take 10 mg by mouth daily as needed for allergies.   . ciprofloxacin (CIPRO) 250 MG tablet Take 250 mg by mouth every other day.   . clopidogrel (PLAVIX) 75 MG tablet Take 1 tablet (75 mg total) by mouth daily with breakfast.  . CRANBERRY EXTRACT PO Take 1 tablet by mouth daily.  . Cyanocobalamin (VITAMIN B-12 PO) Take 1 tablet by mouth every  other day.  . levothyroxine (SYNTHROID, LEVOTHROID) 50 MCG tablet Take 50 mcg by mouth daily with breakfast.   . losartan (COZAAR) 50 MG tablet Take 50 mg by mouth at bedtime.   . Multiple Vitamins-Minerals (OCUVITE PRESERVISION PO) Take 1 tablet by mouth daily.  Vladimir Faster Glycol-Propyl Glycol (SYSTANE OP) Place 1 drop into both eyes daily as needed (for dry eyes).     Allergies:   Codeine and Contrast media [iodinated diagnostic agents]   Social History   Tobacco Use  . Smoking status: Never Smoker  . Smokeless tobacco: Never Used  Substance Use Topics  . Alcohol use: No  . Drug use: No     Family Hx: The patient's family history is not on file.  ROS:   Please see the history of present illness.    She denies chest pain, palpitations, dyspnea, pnd, orthopnea, n, v, dizziness, syncope, edema, weight gain, or early satiety.  All other systems reviewed and are negative.   Prior CV studies:   The following studies were reviewed today:  11/2017 1.  No significant aortoiliac disease. 2.  Right lower extremity: Moderate calcified disease involving the proximal SFA followed by short occlusion distally with moderate disease in the popliteal artery and one-vessel runoff below the knee via the peroneal artery with severe disease involving the TP trunk into the ostium of the peroneal artery. 3.  Successful orbital atherectomy and drug-coated balloon angioplasty to the right SFA, TP trunk and proximal peroneal artery. Recommendations: Continue dual antiplatelet therapy.  Aggressive treatment of risk factors in wound care.  Possible discharge home tomorrow.  07/2019 Summary: Right: 50-74% stenosis noted in the superficial femoral artery. No significant change compared to previous study. Left: 50-74% stenosis noted in the superficial femoral artery. Patent stent with no evidence of stenosis in the Prox to mid SFA artery. No significant change as compared to previous study. No significant  change in disease severity above the knee. No  waveform change was seen along the length of the leg vessels. By color flow Doppler, there was scant flow in the PTA and peronela artery and, the ATA could not be reliably identified.  ABIs Right ABIs and TBIs appear essentially unchanged compared to prior study on 06/21/18. Left ABIs and TBIs appear decreased compared to prior study on 06/21/18. Left ABI showed a drop of 0.15 Summary: Right: Resting right ankle-brachial index indicates moderate right lower extremity arterial disease. The right toe-brachial index is abnormal. Left: Resting left ankle-brachial index indicates moderate left lower extremity arterial disease. The left toe-brachial index is abnormal.   Labs/Other Tests and  Data Reviewed:    EKG:  No ECG reviewed.  Recent Labs: No results found for requested labs within last 8760 hours.   Recent Lipid Panel No results found for: CHOL, TRIG, HDL, CHOLHDL, LDLCALC, LDLDIRECT  Wt Readings from Last 3 Encounters:  10/25/19 145 lb (65.8 kg)  03/15/19 138 lb (62.6 kg)  04/10/18 136 lb (61.7 kg)     Objective:    Vital Signs:  BP (!) 146/68 (BP Location: Left Arm, Patient Position: Sitting, Cuff Size: Normal)   Pulse 77   Ht 5\' 4"  (1.626 m)   Wt 145 lb (65.8 kg)   SpO2 96%   BMI 24.89 kg/m    VITAL SIGNS:  reviewed  ASSESSMENT & PLAN:    PAD --Denies any s/sx of claudication or ulcers. Continue Plavix (ASA discontinued). Per daughter, no recent falls or s/sx consistent with bleeding. Per patient / daughter preference is to defer labs (CBC) to PCP with daughter reporting she has an upcoming physical. Most recent imaging as above and showing reduced ABIs on the left side. Recommend in clinic follow-up to reassess the blood flow once patient feels safe from a COVID-19 risk standpoint; however, until that time, daughter will closely monitor and call the office with any concerns including pain, color change, change in leg  temperature, or new ulcerations.  PAF --Asymptomatic. Noted 11/2017 but NSR at last office visit 04/2018. CHA2DS2VASc score of at least 5; however, as burden of Afib has been thought to be low, no previous [place]. She has also been on DAPT with ASA and Plavix, though ASA recently discontinued. Given patient's age and lack of symptoms, as well as low burden of PAF, we will defer for now. Daughter agreeable to call the office if any racing HR /palpitations, presyncope, or syncope noted. We could also obtain a Zio monitor in the future (or mail her one) to reassess / quantify the burden of Afib if preferred in the future. Will defer monitoring of TSH to PCP. Most recent electrolytes available were reviewed and stable.   HTN --Recent home BP above and elevated. Given the age of the patient (86yo) and that this is an elevated home reading x1, will defer any changes for now. Daughter is agreeable to this plan and will closely monitor her mother's BP going forward and call for SBP consistently over 130. For now, continue current dose of amlodipine and losartan. Discussed that we could increase her amlodipine over the phone if the daughter calls with consistently elevated pressures.   HLD --Continue Lipitor. Tolerating this medication well.   Hypothyroid --Continue synthroid. Per preference, will defer recheck of TSH to PCP.   COVID-19 Education: The signs and symptoms of COVID-19 were discussed with the patient and how to seek care for testing (follow up with PCP or arrange E-visit).  The importance of social distancing was discussed today.  Time:   Today, I have spent 10 minutes with the patient with telehealth technology discussing the above problems.     Medication Adjustments/Labs and Tests Ordered: Current medicines are reviewed at length with the patient today.  Concerns regarding medicines are outlined above.   Tests Ordered: No orders of the defined types were placed in this  encounter.   Medication Changes: No orders of the defined types were placed in this encounter.   Follow Up:  In Person in 6 month(s) (or virtual visit in 6 months pending pandemic outcomes, whichever is safest for the patient from an exposure point of view and  dependent on patient symptoms at the time)  Signed, Arvil Chaco, PA-C  10/25/2019 10:48 AM    [place]

## 2019-10-25 NOTE — Patient Instructions (Signed)
Medication Instructions:  None ordered  *If you need a refill on your cardiac medications before your next appointment, please call your pharmacy*  Lab Work: None ordered If you have labs (blood work) drawn today and your tests are completely normal, you will receive your results only by: Marland Kitchen MyChart Message (if you have MyChart) OR . A paper copy in the mail If you have any lab test that is abnormal or we need to change your treatment, we will call you to review the results.  Testing/Procedures: None ordered  Follow-Up: At [facility], you and your health needs are our priority.  As part of our continuing mission to provide you with exceptional heart care, we have created designated Provider Care Teams.  These Care Teams include your primary Cardiologist (physician) and Advanced Practice Providers (APPs -  Physician Assistants and Nurse Practitioners) who all work together to provide you with the care you need, when you need it.  Your next appointment:   6 month(s)  The format for your next appointment:   In Person  Provider:    You may see [name], MD or one of the following Advanced Practice Providers on your designated Care Team:    Murray Hodgkins, NP  Christell Faith, PA-C  Marrianne Mood, PA-C   Other Instructions NA

## 2019-11-01 ENCOUNTER — Encounter (INDEPENDENT_AMBULATORY_CARE_PROVIDER_SITE_OTHER): Payer: Medicare PPO | Admitting: Ophthalmology

## 2019-11-01 DIAGNOSIS — H353231 Exudative age-related macular degeneration, bilateral, with active choroidal neovascularization: Secondary | ICD-10-CM

## 2019-11-01 DIAGNOSIS — I10 Essential (primary) hypertension: Secondary | ICD-10-CM

## 2019-11-01 DIAGNOSIS — H35033 Hypertensive retinopathy, bilateral: Secondary | ICD-10-CM | POA: Diagnosis not present

## 2019-11-01 DIAGNOSIS — H43813 Vitreous degeneration, bilateral: Secondary | ICD-10-CM | POA: Diagnosis not present

## 2019-11-12 ENCOUNTER — Other Ambulatory Visit: Payer: Self-pay | Admitting: Cardiovascular Disease

## 2019-12-06 ENCOUNTER — Encounter (INDEPENDENT_AMBULATORY_CARE_PROVIDER_SITE_OTHER): Payer: Medicare PPO | Admitting: Ophthalmology

## 2019-12-06 ENCOUNTER — Other Ambulatory Visit: Payer: Self-pay

## 2019-12-06 DIAGNOSIS — H353231 Exudative age-related macular degeneration, bilateral, with active choroidal neovascularization: Secondary | ICD-10-CM

## 2019-12-06 DIAGNOSIS — H35033 Hypertensive retinopathy, bilateral: Secondary | ICD-10-CM | POA: Diagnosis not present

## 2019-12-06 DIAGNOSIS — H43813 Vitreous degeneration, bilateral: Secondary | ICD-10-CM | POA: Diagnosis not present

## 2019-12-06 DIAGNOSIS — I10 Essential (primary) hypertension: Secondary | ICD-10-CM | POA: Diagnosis not present

## 2020-01-31 ENCOUNTER — Encounter (INDEPENDENT_AMBULATORY_CARE_PROVIDER_SITE_OTHER): Payer: Medicare PPO | Admitting: Ophthalmology

## 2020-04-03 ENCOUNTER — Encounter (INDEPENDENT_AMBULATORY_CARE_PROVIDER_SITE_OTHER): Payer: Medicare PPO | Admitting: Ophthalmology

## 2020-04-03 ENCOUNTER — Other Ambulatory Visit: Payer: Self-pay

## 2020-04-03 DIAGNOSIS — H353231 Exudative age-related macular degeneration, bilateral, with active choroidal neovascularization: Secondary | ICD-10-CM | POA: Diagnosis not present

## 2020-04-03 DIAGNOSIS — H43813 Vitreous degeneration, bilateral: Secondary | ICD-10-CM | POA: Diagnosis not present

## 2020-04-03 DIAGNOSIS — I10 Essential (primary) hypertension: Secondary | ICD-10-CM | POA: Diagnosis not present

## 2020-04-03 DIAGNOSIS — H35033 Hypertensive retinopathy, bilateral: Secondary | ICD-10-CM | POA: Diagnosis not present

## 2020-04-09 ENCOUNTER — Encounter (HOSPITAL_COMMUNITY): Payer: Self-pay | Admitting: Emergency Medicine

## 2020-04-09 ENCOUNTER — Inpatient Hospital Stay (HOSPITAL_COMMUNITY)
Admission: EM | Admit: 2020-04-09 | Discharge: 2020-04-15 | DRG: 480 | Disposition: A | Discharge status: Home Health | Payer: Medicare PPO | Attending: Internal Medicine | Admitting: Internal Medicine

## 2020-04-09 ENCOUNTER — Emergency Department (HOSPITAL_COMMUNITY): Payer: Medicare PPO

## 2020-04-09 ENCOUNTER — Other Ambulatory Visit: Payer: Self-pay

## 2020-04-09 DIAGNOSIS — Y92009 Unspecified place in unspecified non-institutional (private) residence as the place of occurrence of the external cause: Secondary | ICD-10-CM | POA: Diagnosis not present

## 2020-04-09 DIAGNOSIS — Z79890 Hormone replacement therapy (postmenopausal): Secondary | ICD-10-CM

## 2020-04-09 DIAGNOSIS — W01190A Fall on same level from slipping, tripping and stumbling with subsequent striking against furniture, initial encounter: Secondary | ICD-10-CM | POA: Diagnosis present

## 2020-04-09 DIAGNOSIS — Z885 Allergy status to narcotic agent status: Secondary | ICD-10-CM | POA: Diagnosis not present

## 2020-04-09 DIAGNOSIS — R0602 Shortness of breath: Secondary | ICD-10-CM

## 2020-04-09 DIAGNOSIS — Z95828 Presence of other vascular implants and grafts: Secondary | ICD-10-CM

## 2020-04-09 DIAGNOSIS — S72141A Displaced intertrochanteric fracture of right femur, initial encounter for closed fracture: Secondary | ICD-10-CM | POA: Diagnosis present

## 2020-04-09 DIAGNOSIS — H3530 Unspecified macular degeneration: Secondary | ICD-10-CM | POA: Diagnosis present

## 2020-04-09 DIAGNOSIS — Z66 Do not resuscitate: Secondary | ICD-10-CM | POA: Diagnosis present

## 2020-04-09 DIAGNOSIS — Z419 Encounter for procedure for purposes other than remedying health state, unspecified: Secondary | ICD-10-CM

## 2020-04-09 DIAGNOSIS — I110 Hypertensive heart disease with heart failure: Secondary | ICD-10-CM | POA: Diagnosis present

## 2020-04-09 DIAGNOSIS — Z853 Personal history of malignant neoplasm of breast: Secondary | ICD-10-CM

## 2020-04-09 DIAGNOSIS — W19XXXA Unspecified fall, initial encounter: Secondary | ICD-10-CM

## 2020-04-09 DIAGNOSIS — Z9011 Acquired absence of right breast and nipple: Secondary | ICD-10-CM

## 2020-04-09 DIAGNOSIS — E039 Hypothyroidism, unspecified: Secondary | ICD-10-CM

## 2020-04-09 DIAGNOSIS — Z7902 Long term (current) use of antithrombotics/antiplatelets: Secondary | ICD-10-CM

## 2020-04-09 DIAGNOSIS — Z79899 Other long term (current) drug therapy: Secondary | ICD-10-CM | POA: Diagnosis not present

## 2020-04-09 DIAGNOSIS — I480 Paroxysmal atrial fibrillation: Secondary | ICD-10-CM | POA: Diagnosis present

## 2020-04-09 DIAGNOSIS — S72143A Displaced intertrochanteric fracture of unspecified femur, initial encounter for closed fracture: Secondary | ICD-10-CM | POA: Diagnosis present

## 2020-04-09 DIAGNOSIS — R54 Age-related physical debility: Secondary | ICD-10-CM | POA: Diagnosis present

## 2020-04-09 DIAGNOSIS — I739 Peripheral vascular disease, unspecified: Secondary | ICD-10-CM

## 2020-04-09 DIAGNOSIS — M25561 Pain in right knee: Secondary | ICD-10-CM | POA: Diagnosis not present

## 2020-04-09 DIAGNOSIS — I10 Essential (primary) hypertension: Secondary | ICD-10-CM | POA: Diagnosis not present

## 2020-04-09 DIAGNOSIS — Z20822 Contact with and (suspected) exposure to covid-19: Secondary | ICD-10-CM | POA: Diagnosis present

## 2020-04-09 DIAGNOSIS — I5031 Acute diastolic (congestive) heart failure: Secondary | ICD-10-CM | POA: Diagnosis present

## 2020-04-09 DIAGNOSIS — S50311A Abrasion of right elbow, initial encounter: Secondary | ICD-10-CM | POA: Diagnosis present

## 2020-04-09 DIAGNOSIS — D62 Acute posthemorrhagic anemia: Secondary | ICD-10-CM | POA: Diagnosis not present

## 2020-04-09 DIAGNOSIS — M25569 Pain in unspecified knee: Secondary | ICD-10-CM

## 2020-04-09 DIAGNOSIS — S72001A Fracture of unspecified part of neck of right femur, initial encounter for closed fracture: Secondary | ICD-10-CM | POA: Diagnosis not present

## 2020-04-09 HISTORY — DX: Unspecified macular degeneration: H35.30

## 2020-04-09 LAB — URINALYSIS, ROUTINE W REFLEX MICROSCOPIC
Bilirubin Urine: NEGATIVE
Glucose, UA: NEGATIVE mg/dL
Ketones, ur: 5 mg/dL — AB
Nitrite: NEGATIVE
Protein, ur: NEGATIVE mg/dL
Specific Gravity, Urine: 1.013 (ref 1.005–1.030)
pH: 6 (ref 5.0–8.0)

## 2020-04-09 LAB — CBC WITH DIFFERENTIAL/PLATELET
Abs Immature Granulocytes: 0.03 10*3/uL (ref 0.00–0.07)
Basophils Absolute: 0 10*3/uL (ref 0.0–0.1)
Basophils Relative: 0 %
Eosinophils Absolute: 0.2 10*3/uL (ref 0.0–0.5)
Eosinophils Relative: 2 %
HCT: 40.7 % (ref 36.0–46.0)
Hemoglobin: 13 g/dL (ref 12.0–15.0)
Immature Granulocytes: 0 %
Lymphocytes Relative: 21 %
Lymphs Abs: 1.6 10*3/uL (ref 0.7–4.0)
MCH: 30.9 pg (ref 26.0–34.0)
MCHC: 31.9 g/dL (ref 30.0–36.0)
MCV: 96.7 fL (ref 80.0–100.0)
Monocytes Absolute: 0.7 10*3/uL (ref 0.1–1.0)
Monocytes Relative: 9 %
Neutro Abs: 5.2 10*3/uL (ref 1.7–7.7)
Neutrophils Relative %: 68 %
Platelets: 251 10*3/uL (ref 150–400)
RBC: 4.21 MIL/uL (ref 3.87–5.11)
RDW: 13.2 % (ref 11.5–15.5)
WBC: 7.7 10*3/uL (ref 4.0–10.5)
nRBC: 0 % (ref 0.0–0.2)

## 2020-04-09 LAB — ABO/RH: ABO/RH(D): A POS

## 2020-04-09 LAB — COMPREHENSIVE METABOLIC PANEL
ALT: 14 U/L (ref 0–44)
AST: 19 U/L (ref 15–41)
Albumin: 3.5 g/dL (ref 3.5–5.0)
Alkaline Phosphatase: 60 U/L (ref 38–126)
Anion gap: 9 (ref 5–15)
BUN: 13 mg/dL (ref 8–23)
CO2: 23 mmol/L (ref 22–32)
Calcium: 9.2 mg/dL (ref 8.9–10.3)
Chloride: 108 mmol/L (ref 98–111)
Creatinine, Ser: 0.89 mg/dL (ref 0.44–1.00)
GFR calc Af Amer: >60 mL/min (ref >60)
GFR calc non Af Amer: 54 mL/min — ABNORMAL LOW (ref >60)
Glucose, Bld: 166 mg/dL — ABNORMAL HIGH (ref 70–99)
Potassium: 3.5 mmol/L (ref 3.5–5.1)
Sodium: 140 mmol/L (ref 135–145)
Total Bilirubin: 0.6 mg/dL (ref 0.3–1.2)
Total Protein: 6.3 g/dL — ABNORMAL LOW (ref 6.5–8.1)

## 2020-04-09 LAB — PROTIME-INR
INR: 1 (ref 0.8–1.2)
Prothrombin Time: 12.5 seconds (ref 11.4–15.2)

## 2020-04-09 LAB — SARS CORONAVIRUS 2 BY RT PCR (HOSPITAL ORDER, PERFORMED IN ~~LOC~~ HOSPITAL LAB): SARS Coronavirus 2: NEGATIVE

## 2020-04-09 LAB — MAGNESIUM: Magnesium: 1.9 mg/dL (ref 1.7–2.4)

## 2020-04-09 LAB — TYPE AND SCREEN
ABO/RH(D): A POS
Antibody Screen: NEGATIVE

## 2020-04-09 LAB — TROPONIN I (HIGH SENSITIVITY): Troponin I (High Sensitivity): 15 ng/L (ref <18)

## 2020-04-09 LAB — PHOSPHORUS: Phosphorus: 2.8 mg/dL (ref 2.5–4.6)

## 2020-04-09 LAB — BRAIN NATRIURETIC PEPTIDE: B Natriuretic Peptide: 188.8 pg/mL — ABNORMAL HIGH (ref 0.0–100.0)

## 2020-04-09 MED ORDER — LOSARTAN POTASSIUM 50 MG PO TABS
50.0000 mg | ORAL_TABLET | Freq: Every evening | ORAL | Status: DC
  Administered 2020-04-09 – 2020-04-12 (×4): 50 mg via ORAL
  Filled 2020-04-09 (×4): qty 1

## 2020-04-09 MED ORDER — CHLORHEXIDINE GLUCONATE 4 % EX LIQD
60.0000 mL | Freq: Once | CUTANEOUS | Status: AC
  Administered 2020-04-10: 4 via TOPICAL
  Filled 2020-04-09: qty 60

## 2020-04-09 MED ORDER — CEFAZOLIN SODIUM-DEXTROSE 2-4 GM/100ML-% IV SOLN
2.0000 g | INTRAVENOUS | Status: AC
  Administered 2020-04-10: 2 g via INTRAVENOUS
  Filled 2020-04-09 (×2): qty 100

## 2020-04-09 MED ORDER — ACETAMINOPHEN 325 MG PO TABS
650.0000 mg | ORAL_TABLET | Freq: Four times a day (QID) | ORAL | Status: DC | PRN
  Administered 2020-04-09 – 2020-04-15 (×12): 650 mg via ORAL
  Filled 2020-04-09 (×12): qty 2

## 2020-04-09 MED ORDER — AMLODIPINE BESYLATE 5 MG PO TABS
5.0000 mg | ORAL_TABLET | Freq: Every evening | ORAL | Status: DC
  Administered 2020-04-09 – 2020-04-12 (×4): 5 mg via ORAL
  Filled 2020-04-09 (×4): qty 1

## 2020-04-09 MED ORDER — INSULIN ASPART 100 UNIT/ML ~~LOC~~ SOLN
0.0000 [IU] | SUBCUTANEOUS | Status: DC
  Administered 2020-04-09: 2 [IU] via SUBCUTANEOUS
  Administered 2020-04-10: 1 [IU] via SUBCUTANEOUS
  Administered 2020-04-10: 5 [IU] via SUBCUTANEOUS
  Administered 2020-04-11 (×2): 2 [IU] via SUBCUTANEOUS
  Administered 2020-04-11 – 2020-04-15 (×13): 1 [IU] via SUBCUTANEOUS
  Administered 2020-04-15: 2 [IU] via SUBCUTANEOUS
  Administered 2020-04-15: 1 [IU] via SUBCUTANEOUS

## 2020-04-09 MED ORDER — LEVOTHYROXINE SODIUM 50 MCG PO TABS
50.0000 ug | ORAL_TABLET | Freq: Every day | ORAL | Status: DC
  Administered 2020-04-10 – 2020-04-15 (×6): 50 ug via ORAL
  Filled 2020-04-09 (×6): qty 1

## 2020-04-09 MED ORDER — CALCIUM CARBONATE-VITAMIN D 500-200 MG-UNIT PO TABS
1.0000 | ORAL_TABLET | Freq: Every day | ORAL | Status: DC
  Administered 2020-04-10 – 2020-04-15 (×6): 1 via ORAL
  Filled 2020-04-09 (×6): qty 1

## 2020-04-09 NOTE — ED Triage Notes (Signed)
Pt presents [place] with EMS for fall on thinners, Level 2 activation. Takes Plavix. Hit R side of face on a counter, trauma to R hip (shortening and rotation). Given 162mcg fentanyl by EMS. 92% RA, improved to 96% on 2L. 200/110, 100bpm, NSR.  HTN, stents to bilat legs, HLD, mild confusion at home, A&Ox4 with EMS

## 2020-04-09 NOTE — ED Provider Notes (Signed)
[place] EMERGENCY DEPARTMENT Provider Note   CSN: [id number] Arrival date & time: 04/09/20  1642     History Chief Complaint  Patient presents with  . Fall    [name] is a 86 y.o. female presenting for evaluation after fall.  Level 5 caveat due to difficulty understanding patient due to patient being edentulous without her dentures.  Per history, it appears that patient tripped and fell just prior to arrival.  She hit the right side of her face per EMS.  Patient is reporting right-sided hip pain.  She denies pain elsewhere.  She is on Plavix.  She denies chest pain, abdominal pain, or back pain.  Additional history obtained from patient's daughters.  Daughter was present at the time of the fall.  Per daughter, patient lost her balance, and fell sideways.  She did not lose consciousness.  Daughters report a medical history of macular degeneration, PAD status post lower extremity stent placement, breast cancer status postmastectomy, and hypertension.  No history of diabetes, asthma, COPD, or cardiac issues.  HPI     Past Medical History:  Diagnosis Date  . Hypertension     There are no problems to display for this patient.    OB History   No obstetric history on file.     Family History  Problem Relation Age of Onset  . Diabetes Mother   . CVA Sister   . CVA Brother     Social History   Tobacco Use  . Smoking status: Not on file  Substance Use Topics  . Alcohol use: Not on file  . Drug use: Not on file    Home Medications Prior to Admission medications   Medication Sig Start Date End Date Taking? Authorizing Provider  acetaminophen (TYLENOL) 500 MG tablet Take 1,000 mg by mouth at bedtime.   Yes [provider]  amLODipine (NORVASC) 5 MG tablet Take 5 mg by mouth every evening.  01/22/20  Yes [provider]  clopidogrel (PLAVIX) 75 MG tablet Take 75 mg by mouth daily. 02/08/20  Yes [provider]    Cranberry 125 MG TABS Take 1 tablet by mouth daily.   Yes [provider]  levothyroxine (SYNTHROID) 50 MCG tablet Take 50 mcg by mouth daily before breakfast.  03/03/20  Yes [provider]  losartan (COZAAR) 50 MG tablet Take 50 mg by mouth every evening.  01/22/20  Yes [provider]  Multiple Vitamins-Minerals (PRESERVISION AREDS) TABS Take 1 tablet by mouth daily.   Yes [provider]  vitamin B-12 (CYANOCOBALAMIN) 500 MCG tablet Take 500 mcg by mouth every other day.   Yes [provider]    Allergies    Codeine  Review of Systems   Review of Systems  Unable to perform ROS: Other  Musculoskeletal: Positive for arthralgias.  Hematological: Bruises/bleeds easily.    Physical Exam Updated Vital Signs BP (!) 152/75   Pulse (!) 106   Temp 97.6 F (36.4 C) (Oral)   Resp 13   Ht 5\' 2"  (1.575 m)   Wt 65.8 kg   SpO2 92%   BMI 26.52 kg/m   Physical Exam Vitals and nursing note reviewed.  Constitutional:      General: She is not in acute distress.    Appearance: She is well-developed.     Comments: Elderly female who appears nontoxic  HENT:     Head: Normocephalic and atraumatic.     Comments: No obvious signs of head  trauma.  No contusions, lacerations, or hematomas noted of the face or scalp Eyes:     Extraocular Movements: Extraocular movements intact.     Conjunctiva/sclera: Conjunctivae normal.     Comments: Pupils unequal  Neck:     Comments: In c-collar.  No tenderness Cardiovascular:     Rate and Rhythm: Normal rate and regular rhythm.     Pulses: Normal pulses.  Pulmonary:     Effort: Pulmonary effort is normal. No respiratory distress.     Breath sounds: Normal breath sounds. No wheezing.     Comments: No ttp of the chest wall.  Speaking in full sentences. Chest:     Chest wall: No tenderness.  Abdominal:     General: There is no distension.     Palpations: Abdomen is soft. There is no mass.     Tenderness:  There is no abdominal tenderness. There is no guarding or rebound.  Musculoskeletal:        General: Tenderness present.     Comments: Tenderness of the right hip.  Right leg is shortened and externally rotated.  1+ pedal pulses bilaterally with equal temperature and color lower the feet.  No tenderness palpation of back or midline spine.  No obvious trauma to the back. Full active range of motion of the right elbow without pain.  Skin:    General: Skin is warm and dry.     Capillary Refill: Capillary refill takes less than 2 seconds.     Comments: superficial abrasion to posterior r elbow.   Neurological:     Mental Status: She is alert.     GCS: GCS eye subscore is 4. GCS verbal subscore is 5. GCS motor subscore is 6.     ED Results / Procedures / Treatments   Labs (all labs ordered are listed, but only abnormal results are displayed) Labs Reviewed  COMPREHENSIVE METABOLIC PANEL - Abnormal; Notable for the following components:      Result Value   Glucose, Bld 166 (*)    Total Protein 6.3 (*)    GFR calc non Af Amer 54 (*)    All other components within normal limits  SARS CORONAVIRUS 2 BY RT PCR (HOSPITAL ORDER, [facility])  CBC WITH DIFFERENTIAL/PLATELET  PROTIME-INR  URINALYSIS, ROUTINE W REFLEX MICROSCOPIC  TYPE AND SCREEN  ABO/RH    EKG EKG Interpretation  Date/Time:  Thursday April 09 2020 18:04:50 EDT Ventricular Rate:  107 PR Interval:    QRS Duration: 104 QT Interval:  341 QTC Calculation: 455 R Axis:   -48 Text Interpretation: Sinus tachycardia Atrial premature complexes in couplets Incomplete RBBB and LAFB LVH with secondary repolarization abnormality Probable anterior infarct, age indeterminate Baseline wander in lead(s) V2 V4 No old tracing to compare Confirmed by Pryor Curia [phone]) on 04/09/2020 6:06:39 PM   Radiology CT Head Wo Contrast  Result Date: 04/09/2020 CLINICAL DATA:  Fall, on anticoagulation. EXAM: CT HEAD WITHOUT  CONTRAST CT CERVICAL SPINE WITHOUT CONTRAST TECHNIQUE: Multidetector CT imaging of the head and cervical spine was performed following the standard protocol without intravenous contrast. Multiplanar CT image reconstructions of the cervical spine were also generated. COMPARISON:  None. FINDINGS: CT HEAD FINDINGS Brain: There is atrophy and chronic small vessel disease changes. Old right basal ganglia lacunar infarcts. No acute intracranial abnormality. Specifically, no hemorrhage, hydrocephalus, mass lesion, acute infarction, or significant intracranial injury. Vascular: No hyperdense vessel or unexpected calcification. Skull: No acute calvarial abnormality. Sinuses/Orbits: Visualized paranasal sinuses  and mastoids clear. Orbital soft tissues unremarkable. Other: None CT CERVICAL SPINE FINDINGS Alignment: Normal Skull base and vertebrae: No acute fracture. No primary bone lesion or focal pathologic process. Soft tissues and spinal canal: No prevertebral fluid or swelling. No visible canal hematoma. Disc levels: Diffuse advanced degenerative disc disease and facet disease. Upper chest: No acute findings Other: None IMPRESSION: Old right basal ganglia lacunar infarcts. Atrophy, chronic microvascular disease. No acute intracranial abnormality. Degenerative disc and facet disease. No acute bony abnormality in the cervical spine. Electronically Signed   By: Rolm Baptise M.D.   On: 04/09/2020 17:40   CT Cervical Spine Wo Contrast  Result Date: 04/09/2020 CLINICAL DATA:  Fall, on anticoagulation. EXAM: CT HEAD WITHOUT CONTRAST CT CERVICAL SPINE WITHOUT CONTRAST TECHNIQUE: Multidetector CT imaging of the head and cervical spine was performed following the standard protocol without intravenous contrast. Multiplanar CT image reconstructions of the cervical spine were also generated. COMPARISON:  None. FINDINGS: CT HEAD FINDINGS Brain: There is atrophy and chronic small vessel disease changes. Old right basal ganglia lacunar  infarcts. No acute intracranial abnormality. Specifically, no hemorrhage, hydrocephalus, mass lesion, acute infarction, or significant intracranial injury. Vascular: No hyperdense vessel or unexpected calcification. Skull: No acute calvarial abnormality. Sinuses/Orbits: Visualized paranasal sinuses and mastoids clear. Orbital soft tissues unremarkable. Other: None CT CERVICAL SPINE FINDINGS Alignment: Normal Skull base and vertebrae: No acute fracture. No primary bone lesion or focal pathologic process. Soft tissues and spinal canal: No prevertebral fluid or swelling. No visible canal hematoma. Disc levels: Diffuse advanced degenerative disc disease and facet disease. Upper chest: No acute findings Other: None IMPRESSION: Old right basal ganglia lacunar infarcts. Atrophy, chronic microvascular disease. No acute intracranial abnormality. Degenerative disc and facet disease. No acute bony abnormality in the cervical spine. Electronically Signed   By: Rolm Baptise M.D.   On: 04/09/2020 17:40   DG Chest Portable 1 View  Result Date: 04/09/2020 CLINICAL DATA:  Fall EXAM: PORTABLE CHEST 1 VIEW COMPARISON:  06/28/2014 FINDINGS: Cardiomegaly. Aortic atherosclerosis. Small left pleural effusion suspected with left base atelectasis. No overt edema. Right lung clear. No pneumothorax or acute bony abnormality. IMPRESSION: Small left pleural effusion with left base atelectasis. Cardiomegaly, aortic atherosclerosis. Electronically Signed   By: Rolm Baptise M.D.   On: 04/09/2020 17:26   DG Hips Bilat W or Wo Pelvis 3-4 Views  Result Date: 04/09/2020 CLINICAL DATA:  Fall EXAM: DG HIP (WITH OR WITHOUT PELVIS) 3-4V BILAT COMPARISON:  None. FINDINGS: There is a right femoral intertrochanteric fracture with mild varus angulation. No subluxation or dislocation. SI joints symmetric. IMPRESSION: Right femoral intertrochanteric fracture with varus angulation. Electronically Signed   By: Rolm Baptise M.D.   On: 04/09/2020 17:27     Procedures Procedures (including critical care time)  Medications Ordered in ED Medications - No data to display  ED Course  I have reviewed the triage vital signs and the nursing notes.  Pertinent labs & imaging results that were available during my care of the patient were reviewed by me and considered in my medical decision making (see chart for details).    MDM Rules/Calculators/A&P                          Patient presenting for evaluation after a fall.  On exam, patient peers nontoxic.  Story is not clear due to difficulty understanding patient.  Waiting for family to arrive.  Per EMS, this was a mechanical fall.  On  exam, patient with tenderness of the right hip, leg is short externally rotated, likely hip fracture.  No obvious other external injury outside of an abrasion to the posterior right elbow.  As patient is moving the elbow without signs of pain, doubt fracture dislocation.  Will obtain chest x-ray as well as CT head and neck due to distracting injury and to ensure no other trauma. Pt seen and evaluated alongside attending, Dr. Leonides Schanz.   Pelvic x-ray viewed interpreted by me, shows right intertrochanteric hip fracture.  Chest x-ray negative for fracture or spinal pulmonary injury.  CT head and neck negative for acute findings including trauma or bleed.  Labs overall reassuring.  Will consult with orthopedic surgery and admit to hospitalist service.  Discussed with Dr. Stann Mainland from orthopedics, who recommends keeping patient n.p.o. after midnight.  If patient is medically cleared, will try to plan on surgery for tomorrow afternoon.  Discussed with Dr. Marice Potter from [facility] hospitalist service, patient to be admitted.  Final Clinical Impression(s) / ED Diagnoses Final diagnoses:  None    Rx / DC Orders ED Discharge Orders    None       Franchot Heidelberg, PA-C 04/09/20 1834    Ward, Delice Bison, DO 04/09/20 1839

## 2020-04-09 NOTE — Progress Notes (Signed)
Orthopedic Tech Progress Note Patient Details:  [name] 21-Apr-1919 [id number]  Level 2 trauma Patient ID: [name], female   DOB: Jul 10, 1919, 86 y.o.   MRN: [id number]   [name] 04/09/2020, 6:58 PM

## 2020-04-09 NOTE — Progress Notes (Signed)
Discussed case with EDP.  Pt will need operative management of right hip fracture.  Plan for OR tomorrow afternoon.  Please make NPO tonight at [place].  Full consult note to come in am.

## 2020-04-09 NOTE — H&P (Signed)
[facility] History and Physical  Neko Mcgeehan SWF:[id number] DOB: 27-Sep-1919 DOA: 04/09/2020  Referring EDP: Junious Silk PCP: Lorelee Market, MD   Chief Complaint: Fall  HPI: [name] is a 86 y.o. female with PMH of HTN, PAD and hypothyroidism who presented after a fall and admitted for further management/surgery.  History provided by patient and two daughters at bedside. She technically lives in her own home and walks with a walker but someone is there with her almost 24/7. Daughter reports that they were about to leave to get their hair done so she went to move the car. When she came back inside, she saw her mom losing her balance and falling to her right side. Barely hit her head on a table. Patient reports she is very upset that this happened. Denies any pain currently. She reports having almost fell in that same spot before. Denies headache, dizziness, fever, chills, cough, SOB, chest pain, abdominal pain, nausea, vomiting, diarrhea, constipation, dysuria, hematuria, hematochezia, melena, trouble eating, confusion or any other complaints.  In the ED: Mild hypertension and tachycardia otherwise vitals stable on room air. Labs remarkable for normal CBC, CMP, INR. UA pending.  CXR: Small left pleural effusion with left base atelectasis. Cardiomegaly, aortic atherosclerosis.   Bilateral Hip XR: Right femoral intertrochanteric fracture with varus angulation.  CT Head and C-Spine: Non-acute.  EDP consulted Ortho who is planning surgery 7/2. TRH for admission.   Review of Systems:  All other systems negative unless noted above in HPI.   Past Medical History:  Diagnosis Date  . Hypertension   . Macular degeneration    Past Surgical History:  Procedure Laterality Date  . MASTECTOMY Right 1989  . PERIPHERAL ARTERIAL STENT GRAFT Left 2017   Social History:  has no history on file for tobacco use, alcohol use, and drug use.  Allergies  Allergen Reactions  . Codeine  Nausea And Vomiting    Family History  Problem Relation Age of Onset  . Diabetes Mother   . CVA Sister   . CVA Brother     Prior to Admission medications   Medication Sig Start Date End Date Taking? Authorizing Provider  amLODipine (NORVASC) 5 MG tablet Take 5 mg by mouth daily. 01/22/20   [provider]  clopidogrel (PLAVIX) 75 MG tablet Take 75 mg by mouth daily. 02/08/20   [provider]  levothyroxine (SYNTHROID) 50 MCG tablet  03/03/20   [provider]  losartan (COZAAR) 50 MG tablet Take 50 mg by mouth daily. 01/22/20   [provider]   Physical Exam: Vitals:   04/09/20 1650 04/09/20 1735 04/09/20 1815 04/09/20 1830  BP:  (!) 167/77 (!) 155/88 (!) 152/75  Pulse:  (!) 106 (!) 110 (!) 106  Resp:  14 20 13   Temp:      TempSrc:      SpO2:  98% 97% 92%  Weight: 65.8 kg     Height:        Wt Readings from Last 3 Encounters:  04/09/20 65.8 kg    . General:  Appears calm and comfortable. AAOx4.  . Eyes: EOMI, normal lids, irises & conjunctiva . ENT: hard of hearing hearing, lips & tongue, teeth absent . Neck: normal ROM . Cardiovascular: Tachycardic with regular rhythm, no m/r/g. No LE edema. Marland Kitchen Respiratory: CTA bilaterally, no w/r/r. Normal respiratory effort. . Abdomen: soft, ntnd . Skin: no rash or induration seen on limited exam; healing decubitus ulcer per daughter-did not roll patient  . Musculoskeletal:  grossly normal tone BUE; did not evaluate BLE due to acute fracture  . Psychiatric: grossly normal mood and affect, speech fluent and appropriate; slightly difficult to understand due to absent teeth  . Neurologic: grossly non-focal.          Labs on Admission:  Basic Metabolic Panel: Recent Labs  Lab 04/09/20 1650  NA 140  K 3.5  CL 108  CO2 23  GLUCOSE 166*  BUN 13  CREATININE 0.89  CALCIUM 9.2   Liver Function Tests: Recent Labs  Lab 04/09/20 1650  AST 19  ALT 14  ALKPHOS 60  BILITOT 0.6  PROT 6.3*    ALBUMIN 3.5   No results for input(s): LIPASE, AMYLASE in the last 168 hours. No results for input(s): AMMONIA in the last 168 hours. CBC: Recent Labs  Lab 04/09/20 1650  WBC 7.7  NEUTROABS 5.2  HGB 13.0  HCT 40.7  MCV 96.7  PLT 251   Cardiac Enzymes: No results for input(s): CKTOTAL, CKMB, CKMBINDEX, TROPONINI in the last 168 hours.  BNP (last 3 results) No results for input(s): BNP in the last 8760 hours.  ProBNP (last 3 results) No results for input(s): PROBNP in the last 8760 hours.  CBG: No results for input(s): GLUCAP in the last 168 hours.  Radiological Exams on Admission: CT Head Wo Contrast  Result Date: 04/09/2020 CLINICAL DATA:  Fall, on anticoagulation. EXAM: CT HEAD WITHOUT CONTRAST CT CERVICAL SPINE WITHOUT CONTRAST TECHNIQUE: Multidetector CT imaging of the head and cervical spine was performed following the standard protocol without intravenous contrast. Multiplanar CT image reconstructions of the cervical spine were also generated. COMPARISON:  None. FINDINGS: CT HEAD FINDINGS Brain: There is atrophy and chronic small vessel disease changes. Old right basal ganglia lacunar infarcts. No acute intracranial abnormality. Specifically, no hemorrhage, hydrocephalus, mass lesion, acute infarction, or significant intracranial injury. Vascular: No hyperdense vessel or unexpected calcification. Skull: No acute calvarial abnormality. Sinuses/Orbits: Visualized paranasal sinuses and mastoids clear. Orbital soft tissues unremarkable. Other: None CT CERVICAL SPINE FINDINGS Alignment: Normal Skull base and vertebrae: No acute fracture. No primary bone lesion or focal pathologic process. Soft tissues and spinal canal: No prevertebral fluid or swelling. No visible canal hematoma. Disc levels: Diffuse advanced degenerative disc disease and facet disease. Upper chest: No acute findings Other: None IMPRESSION: Old right basal ganglia lacunar infarcts. Atrophy, chronic microvascular  disease. No acute intracranial abnormality. Degenerative disc and facet disease. No acute bony abnormality in the cervical spine. Electronically Signed   By: Rolm Baptise M.D.   On: 04/09/2020 17:40   CT Cervical Spine Wo Contrast  Result Date: 04/09/2020 CLINICAL DATA:  Fall, on anticoagulation. EXAM: CT HEAD WITHOUT CONTRAST CT CERVICAL SPINE WITHOUT CONTRAST TECHNIQUE: Multidetector CT imaging of the head and cervical spine was performed following the standard protocol without intravenous contrast. Multiplanar CT image reconstructions of the cervical spine were also generated. COMPARISON:  None. FINDINGS: CT HEAD FINDINGS Brain: There is atrophy and chronic small vessel disease changes. Old right basal ganglia lacunar infarcts. No acute intracranial abnormality. Specifically, no hemorrhage, hydrocephalus, mass lesion, acute infarction, or significant intracranial injury. Vascular: No hyperdense vessel or unexpected calcification. Skull: No acute calvarial abnormality. Sinuses/Orbits: Visualized paranasal sinuses and mastoids clear. Orbital soft tissues unremarkable. Other: None CT CERVICAL SPINE FINDINGS Alignment: Normal Skull base and vertebrae: No acute fracture. No primary bone lesion or focal pathologic process. Soft tissues and spinal canal: No prevertebral fluid or swelling. No visible canal hematoma. Disc levels: Diffuse advanced degenerative  disc disease and facet disease. Upper chest: No acute findings Other: None IMPRESSION: Old right basal ganglia lacunar infarcts. Atrophy, chronic microvascular disease. No acute intracranial abnormality. Degenerative disc and facet disease. No acute bony abnormality in the cervical spine. Electronically Signed   By: Rolm Baptise M.D.   On: 04/09/2020 17:40   DG Chest Portable 1 View  Result Date: 04/09/2020 CLINICAL DATA:  Fall EXAM: PORTABLE CHEST 1 VIEW COMPARISON:  06/28/2014 FINDINGS: Cardiomegaly. Aortic atherosclerosis. Small left pleural effusion  suspected with left base atelectasis. No overt edema. Right lung clear. No pneumothorax or acute bony abnormality. IMPRESSION: Small left pleural effusion with left base atelectasis. Cardiomegaly, aortic atherosclerosis. Electronically Signed   By: Rolm Baptise M.D.   On: 04/09/2020 17:26   DG Hips Bilat W or Wo Pelvis 3-4 Views  Result Date: 04/09/2020 CLINICAL DATA:  Fall EXAM: DG HIP (WITH OR WITHOUT PELVIS) 3-4V BILAT COMPARISON:  None. FINDINGS: There is a right femoral intertrochanteric fracture with mild varus angulation. No subluxation or dislocation. SI joints symmetric. IMPRESSION: Right femoral intertrochanteric fracture with varus angulation. Electronically Signed   By: Rolm Baptise M.D.   On: 04/09/2020 17:27    EKG: Independently reviewed. HR 105. Sinus tachycardia. QTc 455. No STEMI. PACs. Intraventricular conduction delay. LVH.  Assessment/Plan Principal Problem:   Intertrochanteric fracture (HCC) Active Problems:   Hypothyroidism   PAD (peripheral artery disease) ([place])   Essential hypertension   Fall at home, initial encounter  86 y.o. female with PMH of HTN, PAD and hypothyroidism who presented after a fall and admitted for further management/surgery.  Fall Right femoral intertrochanteric fracture with varus angulation - mechanical vs balance issue; patient reports having trouble in this same spot previously - walks with walker at baseline - patient denies chest pain, palpitations, SOB now or prior - Ortho consulted and planning for OR 7/2 - NPO at midnight - Revised Cardiac Risk Index for Pre-Operative Risk: Class I as patient has normal kidney function, no hx CVA, CHF or MI. No insulin use. Higher risk based on age. 3.9% 30-day risk of death, MI, or cardiac arrest. - Will obtain Trop and BNP - Hold plavix - PT/OT/SW consults  - Delirium precautions due to age  - Tylenol for pain; patient denies any pain currently; escalate PRN  HTN - cont home Losartan and  Norvasc   Hypothyroidism - cont home Synthroid   PAD - hx of peripheral artery stent placement in LLE ~ 2017  Code Status: Full; per two daughters at bedside DVT Prophylaxis: None; pre-op Family Communication: Two daughters at bedside Disposition Plan: Admit to inpatient. Patient requiring specialty consultation and surgery. Patient is at high risk for further decompensation due to age and co-morbidities. Anticipate discharge to SNF or rehab in 5-7 days.   Time spent: 50 minutes  Chauncey Mann, MD [facility] Pager [phone]

## 2020-04-10 ENCOUNTER — Inpatient Hospital Stay (HOSPITAL_COMMUNITY): Payer: Medicare PPO | Admitting: Anesthesiology

## 2020-04-10 ENCOUNTER — Inpatient Hospital Stay (HOSPITAL_COMMUNITY): Payer: Medicare PPO

## 2020-04-10 ENCOUNTER — Encounter (HOSPITAL_COMMUNITY): Payer: Self-pay | Admitting: Family Medicine

## 2020-04-10 ENCOUNTER — Encounter (HOSPITAL_COMMUNITY)
Admission: EM | Disposition: A | Discharge status: Home Health | Payer: Self-pay | Source: Home / Self Care | Attending: Internal Medicine

## 2020-04-10 DIAGNOSIS — S72141A Displaced intertrochanteric fracture of right femur, initial encounter for closed fracture: Principal | ICD-10-CM

## 2020-04-10 DIAGNOSIS — Y92009 Unspecified place in unspecified non-institutional (private) residence as the place of occurrence of the external cause: Secondary | ICD-10-CM

## 2020-04-10 DIAGNOSIS — I739 Peripheral vascular disease, unspecified: Secondary | ICD-10-CM

## 2020-04-10 DIAGNOSIS — W19XXXA Unspecified fall, initial encounter: Secondary | ICD-10-CM

## 2020-04-10 HISTORY — PX: INTRAMEDULLARY (IM) NAIL INTERTROCHANTERIC: SHX5875

## 2020-04-10 LAB — BASIC METABOLIC PANEL
Anion gap: 10 (ref 5–15)
BUN: 16 mg/dL (ref 8–23)
CO2: 21 mmol/L — ABNORMAL LOW (ref 22–32)
Calcium: 9.3 mg/dL (ref 8.9–10.3)
Chloride: 107 mmol/L (ref 98–111)
Creatinine, Ser: 0.83 mg/dL (ref 0.44–1.00)
GFR calc Af Amer: >60 mL/min (ref >60)
GFR calc non Af Amer: 58 mL/min — ABNORMAL LOW (ref >60)
Glucose, Bld: 202 mg/dL — ABNORMAL HIGH (ref 70–99)
Potassium: 4 mmol/L (ref 3.5–5.1)
Sodium: 138 mmol/L (ref 135–145)

## 2020-04-10 LAB — SURGICAL PCR SCREEN
MRSA, PCR: NEGATIVE
Staphylococcus aureus: NEGATIVE

## 2020-04-10 LAB — GLUCOSE, CAPILLARY
Glucose-Capillary: 130 mg/dL — ABNORMAL HIGH (ref 70–99)
Glucose-Capillary: 147 mg/dL — ABNORMAL HIGH (ref 70–99)
Glucose-Capillary: 156 mg/dL — ABNORMAL HIGH (ref 70–99)
Glucose-Capillary: 190 mg/dL — ABNORMAL HIGH (ref 70–99)
Glucose-Capillary: 247 mg/dL — ABNORMAL HIGH (ref 70–99)
Glucose-Capillary: 358 mg/dL — ABNORMAL HIGH (ref 70–99)

## 2020-04-10 LAB — CBC
HCT: 38 % (ref 36.0–46.0)
Hemoglobin: 12.2 g/dL (ref 12.0–15.0)
MCH: 31 pg (ref 26.0–34.0)
MCHC: 32.1 g/dL (ref 30.0–36.0)
MCV: 96.7 fL (ref 80.0–100.0)
Platelets: 252 10*3/uL (ref 150–400)
RBC: 3.93 MIL/uL (ref 3.87–5.11)
RDW: 13.1 % (ref 11.5–15.5)
WBC: 10 10*3/uL (ref 4.0–10.5)
nRBC: 0 % (ref 0.0–0.2)

## 2020-04-10 LAB — HEMOGLOBIN A1C
Hgb A1c MFr Bld: 6.7 % — ABNORMAL HIGH (ref 4.8–5.6)
Mean Plasma Glucose: 145.59 mg/dL

## 2020-04-10 SURGERY — FIXATION, FRACTURE, INTERTROCHANTERIC, WITH INTRAMEDULLARY ROD
Anesthesia: General | Site: Hip | Laterality: Right

## 2020-04-10 MED ORDER — CHLORHEXIDINE GLUCONATE 0.12 % MT SOLN
OROMUCOSAL | Status: AC
  Filled 2020-04-10: qty 15

## 2020-04-10 MED ORDER — DEXAMETHASONE SODIUM PHOSPHATE 10 MG/ML IJ SOLN
INTRAMUSCULAR | Status: AC
  Filled 2020-04-10: qty 1

## 2020-04-10 MED ORDER — PHENYLEPHRINE 40 MCG/ML (10ML) SYRINGE FOR IV PUSH (FOR BLOOD PRESSURE SUPPORT)
PREFILLED_SYRINGE | INTRAVENOUS | Status: AC
  Filled 2020-04-10: qty 10

## 2020-04-10 MED ORDER — ROCURONIUM BROMIDE 10 MG/ML (PF) SYRINGE: PREFILLED_SYRINGE | INTRAVENOUS | Status: DC | PRN

## 2020-04-10 MED ORDER — LIDOCAINE 2% (20 MG/ML) 5 ML SYRINGE
INTRAMUSCULAR | Status: AC
  Filled 2020-04-10: qty 5

## 2020-04-10 MED ORDER — POVIDONE-IODINE 10 % EX SWAB: 2.0000 "application " | Freq: Once | CUTANEOUS | Status: DC

## 2020-04-10 MED ORDER — 0.9 % SODIUM CHLORIDE (POUR BTL) OPTIME
TOPICAL | Status: DC | PRN
  Administered 2020-04-10: 1000 mL

## 2020-04-10 MED ORDER — FENTANYL CITRATE (PF) 250 MCG/5ML IJ SOLN
INTRAMUSCULAR | Status: AC
  Filled 2020-04-10: qty 5

## 2020-04-10 MED ORDER — TRANEXAMIC ACID-NACL 1000-0.7 MG/100ML-% IV SOLN
1000.0000 mg | INTRAVENOUS | Status: AC
  Administered 2020-04-10: 1000 mg via INTRAVENOUS
  Filled 2020-04-10: qty 100

## 2020-04-10 MED ORDER — LACTATED RINGERS IV SOLN: INTRAVENOUS | Status: DC

## 2020-04-10 MED ORDER — ONDANSETRON HCL 4 MG/2ML IJ SOLN: 4.0000 mg | Freq: Four times a day (QID) | INTRAMUSCULAR | Status: DC | PRN

## 2020-04-10 MED ORDER — ROCURONIUM BROMIDE 10 MG/ML (PF) SYRINGE
PREFILLED_SYRINGE | INTRAVENOUS | Status: AC
  Filled 2020-04-10: qty 10

## 2020-04-10 MED ORDER — DOCUSATE SODIUM 100 MG PO CAPS
100.0000 mg | ORAL_CAPSULE | Freq: Two times a day (BID) | ORAL | Status: DC
  Administered 2020-04-10 – 2020-04-14 (×7): 100 mg via ORAL
  Filled 2020-04-10 (×9): qty 1

## 2020-04-10 MED ORDER — DEXAMETHASONE SODIUM PHOSPHATE 4 MG/ML IJ SOLN
INTRAMUSCULAR | Status: DC | PRN
  Administered 2020-04-10: 4 mg via INTRAVENOUS

## 2020-04-10 MED ORDER — PHENYLEPHRINE 40 MCG/ML (10ML) SYRINGE FOR IV PUSH (FOR BLOOD PRESSURE SUPPORT)
PREFILLED_SYRINGE | INTRAVENOUS | Status: DC | PRN
  Administered 2020-04-10 (×3): 80 ug via INTRAVENOUS

## 2020-04-10 MED ORDER — PROPOFOL 10 MG/ML IV BOLUS
INTRAVENOUS | Status: DC | PRN
  Administered 2020-04-10: 70 mg via INTRAVENOUS

## 2020-04-10 MED ORDER — LIDOCAINE 2% (20 MG/ML) 5 ML SYRINGE: INTRAMUSCULAR | Status: DC | PRN

## 2020-04-10 MED ORDER — LIDOCAINE 2% (20 MG/ML) 5 ML SYRINGE
INTRAMUSCULAR | Status: DC | PRN
  Administered 2020-04-10: 100 mg via INTRAVENOUS

## 2020-04-10 MED ORDER — METOCLOPRAMIDE HCL 5 MG PO TABS: 5.0000 mg | ORAL_TABLET | Freq: Three times a day (TID) | ORAL | Status: DC | PRN

## 2020-04-10 MED ORDER — ONDANSETRON HCL 4 MG/2ML IJ SOLN
INTRAMUSCULAR | Status: AC
  Filled 2020-04-10: qty 2

## 2020-04-10 MED ORDER — ONDANSETRON HCL 4 MG/2ML IJ SOLN
INTRAMUSCULAR | Status: DC | PRN
  Administered 2020-04-10: 4 mg via INTRAVENOUS

## 2020-04-10 MED ORDER — FENTANYL CITRATE (PF) 100 MCG/2ML IJ SOLN
INTRAMUSCULAR | Status: DC | PRN
  Administered 2020-04-10: 25 ug via INTRAVENOUS
  Administered 2020-04-10: 50 ug via INTRAVENOUS

## 2020-04-10 MED ORDER — PHENYLEPHRINE HCL-NACL 10-0.9 MG/250ML-% IV SOLN
INTRAVENOUS | Status: DC | PRN
Start: 2020-04-10 — End: 2020-04-10
  Administered 2020-04-10: 25 ug/min via INTRAVENOUS

## 2020-04-10 MED ORDER — PROPOFOL 10 MG/ML IV BOLUS: INTRAVENOUS | Status: DC | PRN

## 2020-04-10 MED ORDER — SUGAMMADEX SODIUM 200 MG/2ML IV SOLN
INTRAVENOUS | Status: DC | PRN
  Administered 2020-04-10: 150 mg via INTRAVENOUS

## 2020-04-10 MED ORDER — ASPIRIN 81 MG PO CHEW
81.0000 mg | CHEWABLE_TABLET | Freq: Every day | ORAL | Status: DC
  Administered 2020-04-10 – 2020-04-12 (×3): 81 mg via ORAL
  Filled 2020-04-10 (×3): qty 1

## 2020-04-10 MED ORDER — METOCLOPRAMIDE HCL 5 MG/ML IJ SOLN: 5.0000 mg | Freq: Three times a day (TID) | INTRAMUSCULAR | Status: DC | PRN

## 2020-04-10 MED ORDER — ONDANSETRON HCL 4 MG PO TABS: 4.0000 mg | ORAL_TABLET | Freq: Four times a day (QID) | ORAL | Status: DC | PRN

## 2020-04-10 MED ORDER — ROCURONIUM BROMIDE 10 MG/ML (PF) SYRINGE
PREFILLED_SYRINGE | INTRAVENOUS | Status: DC | PRN
  Administered 2020-04-10: 40 mg via INTRAVENOUS

## 2020-04-10 MED ORDER — CEFAZOLIN SODIUM-DEXTROSE 1-4 GM/50ML-% IV SOLN
1.0000 g | Freq: Four times a day (QID) | INTRAVENOUS | Status: AC
  Administered 2020-04-10 – 2020-04-11 (×3): 1 g via INTRAVENOUS
  Filled 2020-04-10 (×3): qty 50

## 2020-04-10 MED ORDER — SODIUM CHLORIDE 0.9 % IV SOLN
INTRAVENOUS | Status: DC | PRN
Start: 2020-04-10 — End: 2020-04-10

## 2020-04-10 SURGICAL SUPPLY — 41 items
ALCOHOL 70% 16 OZ (MISCELLANEOUS) ×3 IMPLANT
BIT DRILL CALIBRATED 4.2 (BIT) ×1 IMPLANT
BNDG COHESIVE 6X5 TAN STRL LF (GAUZE/BANDAGES/DRESSINGS) ×6 IMPLANT
CANISTER SUCT 3000ML PPV (MISCELLANEOUS) ×3 IMPLANT
COVER PERINEAL POST (MISCELLANEOUS) ×3 IMPLANT
COVER SURGICAL LIGHT HANDLE (MISCELLANEOUS) ×3 IMPLANT
COVER WAND RF STERILE (DRAPES) ×3 IMPLANT
DRAPE HALF SHEET 40X57 (DRAPES) IMPLANT
DRAPE INCISE IOBAN 66X45 STRL (DRAPES) ×3 IMPLANT
DRAPE STERI IOBAN 125X83 (DRAPES) ×3 IMPLANT
DRILL BIT CALIBRATED 4.2 (BIT) ×3
DRSG ADAPTIC 3X8 NADH LF (GAUZE/BANDAGES/DRESSINGS) ×3 IMPLANT
DURAPREP 26ML APPLICATOR (WOUND CARE) ×3 IMPLANT
ELECT CAUTERY BLADE 6.4 (BLADE) ×3 IMPLANT
ELECT REM PT RETURN 9FT ADLT (ELECTROSURGICAL) ×3
ELECTRODE REM PT RTRN 9FT ADLT (ELECTROSURGICAL) ×1 IMPLANT
GAUZE SPONGE 4X4 12PLY STRL (GAUZE/BANDAGES/DRESSINGS) ×3 IMPLANT
GAUZE SPONGE 4X4 12PLY STRL LF (GAUZE/BANDAGES/DRESSINGS) ×3 IMPLANT
GLOVE BIO SURGEON STRL SZ7.5 (GLOVE) ×3 IMPLANT
GLOVE BIOGEL PI IND STRL 8 (GLOVE) ×1 IMPLANT
GLOVE BIOGEL PI INDICATOR 8 (GLOVE) ×2
GOWN STRL REUS W/ TWL LRG LVL3 (GOWN DISPOSABLE) ×1 IMPLANT
GOWN STRL REUS W/ TWL XL LVL3 (GOWN DISPOSABLE) ×1 IMPLANT
GOWN STRL REUS W/TWL LRG LVL3 (GOWN DISPOSABLE) ×3
GOWN STRL REUS W/TWL XL LVL3 (GOWN DISPOSABLE) ×3
GUIDEWIRE 3.2X400 (WIRE) ×3 IMPLANT
KIT BASIN OR (CUSTOM PROCEDURE TRAY) ×3 IMPLANT
KIT TURNOVER KIT B (KITS) ×3 IMPLANT
NAIL TROCH FIX 10X235 RT 130 (Nail) ×3 IMPLANT
NS IRRIG 1000ML POUR BTL (IV SOLUTION) ×3 IMPLANT
PACK GENERAL/GYN (CUSTOM PROCEDURE TRAY) ×3 IMPLANT
PAD ARMBOARD 7.5X6 YLW CONV (MISCELLANEOUS) ×6 IMPLANT
PENCIL BUTTON HOLSTER BLD 10FT (ELECTRODE) ×3 IMPLANT
SCREW LOCK T25 FT 36X5X4.3X (Screw) ×1 IMPLANT
SCREW LOCKING 5.0X36MM (Screw) ×3 IMPLANT
SCREW TFNA 105MM STERILE (Screw) ×3 IMPLANT
STAPLER VISISTAT 35W (STAPLE) ×3 IMPLANT
SUT MON AB 2-0 CT1 36 (SUTURE) ×3 IMPLANT
TOWEL GREEN STERILE (TOWEL DISPOSABLE) ×3 IMPLANT
TOWEL GREEN STERILE FF (TOWEL DISPOSABLE) ×3 IMPLANT
WATER STERILE IRR 1000ML POUR (IV SOLUTION) ×3 IMPLANT

## 2020-04-10 NOTE — Anesthesia Postprocedure Evaluation (Signed)
Anesthesia Post Note  Patient: [name]  Procedure(s) Performed: INTRAMEDULLARY (IM) NAIL INTERTROCHANTRIC (Right Hip)     Patient location during evaluation: PACU Anesthesia Type: General Level of consciousness: sedated and patient cooperative Pain management: pain level controlled Vital Signs Assessment: post-procedure vital signs reviewed and stable Respiratory status: spontaneous breathing Cardiovascular status: stable Anesthetic complications: no   No complications documented.  Last Vitals:  Vitals:   04/10/20 1420 04/10/20 1445  BP: (!) 149/59 (!) 155/54  Pulse: 99 96  Resp: 15 15  Temp: 36.7 C 36.5 C  SpO2: 92% 93%    Last Pain:  Vitals:   04/10/20 1445  TempSrc: Oral  PainSc:                  Nolon Nations

## 2020-04-10 NOTE — Anesthesia Procedure Notes (Addendum)
Procedure Name: Intubation Date/Time: 04/10/2020 12:46 PM Performed by: Jenne Campus, CRNA Pre-anesthesia Checklist: Patient identified, Emergency Drugs available, Suction available and Patient being monitored Patient Re-evaluated:Patient Re-evaluated prior to induction Oxygen Delivery Method: Circle System Utilized Preoxygenation: Pre-oxygenation with 100% oxygen Induction Type: IV induction Ventilation: Mask ventilation without difficulty Laryngoscope Size: Miller and 2 Grade View: Grade I Tube type: Oral Tube size: 7.0 mm Number of attempts: 1 Airway Equipment and Method: Stylet and Oral airway Placement Confirmation: ETT inserted through vocal cords under direct vision,  positive ETCO2 and breath sounds checked- equal and bilateral Secured at: 20 cm Tube secured with: Tape Dental Injury: Teeth and Oropharynx as per pre-operative assessment

## 2020-04-10 NOTE — Anesthesia Preprocedure Evaluation (Addendum)
Anesthesia Evaluation  Patient identified by MRN, date of birth, ID band Patient awake    Reviewed: Allergy & Precautions, NPO status , Patient's Chart, lab work & pertinent test results  Airway Mallampati: II  TM Distance: >3 FB Neck ROM: Full    Dental  (+) Dental Advisory Given, Edentulous Upper, Edentulous Lower   Pulmonary neg pulmonary ROS,    Pulmonary exam normal breath sounds clear to auscultation       Cardiovascular hypertension, Pt. on medications + Peripheral Vascular Disease   Rhythm:Regular Rate:Tachycardia + Systolic murmurs    Neuro/Psych negative neurological ROS  negative psych ROS   GI/Hepatic negative GI ROS, Neg liver ROS,   Endo/Other  Hypothyroidism   Renal/GU negative Renal ROS     Musculoskeletal negative musculoskeletal ROS (+)   Abdominal   Peds  Hematology negative hematology ROS (+)   Anesthesia Other Findings   Reproductive/Obstetrics negative OB ROS                           Anesthesia Physical Anesthesia Plan  ASA: III  Anesthesia Plan: General   Post-op Pain Management:    Induction: Intravenous  PONV Risk Score and Plan: 4 or greater and Ondansetron, Dexamethasone and Treatment may vary due to age or medical condition  Airway Management Planned: Oral ETT  Additional Equipment: None  Intra-op Plan:   Post-operative Plan: Extubation in OR  Informed Consent: I have reviewed the patients History and Physical, chart, labs and discussed the procedure including the risks, benefits and alternatives for the proposed anesthesia with the patient or authorized representative who has indicated his/her understanding and acceptance.     Dental advisory given  Plan Discussed with: CRNA  Anesthesia Plan Comments:       Anesthesia Quick Evaluation

## 2020-04-10 NOTE — Transfer of Care (Signed)
Immediate Anesthesia Transfer of Care Note  Patient: [name]  Procedure(s) Performed: INTRAMEDULLARY (IM) NAIL INTERTROCHANTRIC (Right Hip)  Patient Location: PACU  Anesthesia Type:General  Level of Consciousness: oriented and patient cooperative  Airway & Oxygen Therapy: Patient Spontanous Breathing and Patient connected to face mask oxygen  Post-op Assessment: Report given to RN and Post -op Vital signs reviewed and stable  Post vital signs: Reviewed  Last Vitals:  Vitals Value Taken Time  BP 157/76 04/10/20 1350  Temp 36.6 C 04/10/20 1350  Pulse 100 04/10/20 1355  Resp 19 04/10/20 1355  SpO2 96 % 04/10/20 1355  Vitals shown include unvalidated device data.  Last Pain:  Vitals:   04/10/20 1350  TempSrc:   PainSc: Asleep      Patients Stated Pain Goal: 0 (32/95/18 8416)  Complications: No complications documented.

## 2020-04-10 NOTE — Progress Notes (Signed)
OT Cancellation Note  Patient Details Name: Shawndra Clute MRN: [id number] DOB: 03-Apr-1919   Cancelled Treatment:    Reason Eval/Treat Not Completed: Patient at procedure or test/ unavailable (Patient to undergo IM nailing.) OT will continue efforts toward completion of evaluation when patient is able to participate.   Gloris Manchester OTR/L Supplemental OT, Department of rehab services [phone]  Rossie Scarfone R H. 04/10/2020, 3:10 PM

## 2020-04-10 NOTE — Consult Note (Signed)
ORTHOPAEDIC CONSULTATION  REQUESTING PHYSICIAN: Jonetta Osgood, MD  PCP:  Lorelee Market, MD  Chief Complaint: right hip pain  HPI: [name] is a 86 y.o. female who complains of right hip pain.  She was in her normal state of health yesterday where she does live independently and uses a walker at baseline.  She had a ground-level fall landing on her right side.  She did also have questionable loss of consciousness.  That has been evaluated in the emergency department and has no significant etiology of concern.  She currently denies headache, dizziness, shortness of breath or chest pain.  She has a remote history about 3 years ago of a right femoral artery stent.  She is on Plavix for that.  She denies diabetes.  She denies smoking.  Past Medical History:  Diagnosis Date  . Cancer [facility])    Breast Cancer  . Hypertension   . Macular degeneration    Past Surgical History:  Procedure Laterality Date  . MASTECTOMY Right 1989  . PERIPHERAL ARTERIAL STENT GRAFT Left 2017   Social History   Socioeconomic History  . Marital status: Widowed    Spouse name: Not on file  . Number of children: 6  . Years of education: 8  . Highest education level: 8th grade  Occupational History  . Occupation: Retired  Tobacco Use  . Smoking status: Never Smoker  . Smokeless tobacco: Never Used  Substance and Sexual Activity  . Alcohol use: Never  . Drug use: Never  . Sexual activity: Not Currently  Other Topics Concern  . Not on file  Social History Narrative  . Not on file   Social Determinants of Health   Financial Resource Strain:   . Difficulty of Paying Living Expenses:   Food Insecurity:   . Worried About Charity fundraiser in the Last Year:   . Arboriculturist in the Last Year:   Transportation Needs:   . Film/video editor (Medical):   Marland Kitchen Lack of Transportation (Non-Medical):   Physical Activity:   . Days of Exercise per Week:   . Minutes of Exercise per  Session:   Stress:   . Feeling of Stress :   Social Connections:   . Frequency of Communication with Friends and Family:   . Frequency of Social Gatherings with Friends and Family:   . Attends Religious Services:   . Active Member of Clubs or Organizations:   . Attends Archivist Meetings:   Marland Kitchen Marital Status:    Family History  Problem Relation Age of Onset  . Diabetes Mother   . CVA Sister   . CVA Brother    Allergies  Allergen Reactions  . Codeine Nausea And Vomiting   Prior to Admission medications   Medication Sig Start Date End Date Taking? Authorizing Provider  acetaminophen (TYLENOL) 500 MG tablet Take 1,000 mg by mouth at bedtime.   Yes [provider]  amLODipine (NORVASC) 5 MG tablet Take 5 mg by mouth every evening.  01/22/20  Yes [provider]  clopidogrel (PLAVIX) 75 MG tablet Take 75 mg by mouth daily. 02/08/20  Yes [provider]  Cranberry 125 MG TABS Take 1 tablet by mouth daily.   Yes [provider]  levothyroxine (SYNTHROID) 50 MCG tablet Take 50 mcg by mouth daily before breakfast.  03/03/20  Yes [provider]  losartan (COZAAR) 50 MG tablet Take 50 mg by mouth every evening.  01/22/20  Yes [provider]  Multiple Vitamins-Minerals (PRESERVISION AREDS) TABS Take 1 tablet by mouth daily.   Yes [provider]  vitamin B-12 (CYANOCOBALAMIN) 500 MCG tablet Take 500 mcg by mouth every other day.   Yes [provider]   CT Head Wo Contrast  Result Date: 04/09/2020 CLINICAL DATA:  Fall, on anticoagulation. EXAM: CT HEAD WITHOUT CONTRAST CT CERVICAL SPINE WITHOUT CONTRAST TECHNIQUE: Multidetector CT imaging of the head and cervical spine was performed following the standard protocol without intravenous contrast. Multiplanar CT image reconstructions of the cervical spine were also generated. COMPARISON:  None. FINDINGS: CT HEAD FINDINGS Brain: There is atrophy and chronic small vessel  disease changes. Old right basal ganglia lacunar infarcts. No acute intracranial abnormality. Specifically, no hemorrhage, hydrocephalus, mass lesion, acute infarction, or significant intracranial injury. Vascular: No hyperdense vessel or unexpected calcification. Skull: No acute calvarial abnormality. Sinuses/Orbits: Visualized paranasal sinuses and mastoids clear. Orbital soft tissues unremarkable. Other: None CT CERVICAL SPINE FINDINGS Alignment: Normal Skull base and vertebrae: No acute fracture. No primary bone lesion or focal pathologic process. Soft tissues and spinal canal: No prevertebral fluid or swelling. No visible canal hematoma. Disc levels: Diffuse advanced degenerative disc disease and facet disease. Upper chest: No acute findings Other: None IMPRESSION: Old right basal ganglia lacunar infarcts. Atrophy, chronic microvascular disease. No acute intracranial abnormality. Degenerative disc and facet disease. No acute bony abnormality in the cervical spine. Electronically Signed   By: Rolm Baptise M.D.   On: 04/09/2020 17:40   CT Cervical Spine Wo Contrast  Result Date: 04/09/2020 CLINICAL DATA:  Fall, on anticoagulation. EXAM: CT HEAD WITHOUT CONTRAST CT CERVICAL SPINE WITHOUT CONTRAST TECHNIQUE: Multidetector CT imaging of the head and cervical spine was performed following the standard protocol without intravenous contrast. Multiplanar CT image reconstructions of the cervical spine were also generated. COMPARISON:  None. FINDINGS: CT HEAD FINDINGS Brain: There is atrophy and chronic small vessel disease changes. Old right basal ganglia lacunar infarcts. No acute intracranial abnormality. Specifically, no hemorrhage, hydrocephalus, mass lesion, acute infarction, or significant intracranial injury. Vascular: No hyperdense vessel or unexpected calcification. Skull: No acute calvarial abnormality. Sinuses/Orbits: Visualized paranasal sinuses and mastoids clear. Orbital soft tissues unremarkable. Other:  None CT CERVICAL SPINE FINDINGS Alignment: Normal Skull base and vertebrae: No acute fracture. No primary bone lesion or focal pathologic process. Soft tissues and spinal canal: No prevertebral fluid or swelling. No visible canal hematoma. Disc levels: Diffuse advanced degenerative disc disease and facet disease. Upper chest: No acute findings Other: None IMPRESSION: Old right basal ganglia lacunar infarcts. Atrophy, chronic microvascular disease. No acute intracranial abnormality. Degenerative disc and facet disease. No acute bony abnormality in the cervical spine. Electronically Signed   By: Rolm Baptise M.D.   On: 04/09/2020 17:40   DG Chest Portable 1 View  Result Date: 04/09/2020 CLINICAL DATA:  Fall EXAM: PORTABLE CHEST 1 VIEW COMPARISON:  06/28/2014 FINDINGS: Cardiomegaly. Aortic atherosclerosis. Small left pleural effusion suspected with left base atelectasis. No overt edema. Right lung clear. No pneumothorax or acute bony abnormality. IMPRESSION: Small left pleural effusion with left base atelectasis. Cardiomegaly, aortic atherosclerosis. Electronically Signed   By: Rolm Baptise M.D.   On: 04/09/2020 17:26   DG Hips Bilat W or Wo Pelvis 3-4 Views  Result Date: 04/09/2020 CLINICAL DATA:  Fall EXAM: DG HIP (WITH OR WITHOUT PELVIS) 3-4V BILAT COMPARISON:  None. FINDINGS: There is a right femoral intertrochanteric fracture with mild varus angulation. No subluxation or dislocation. SI joints symmetric. IMPRESSION: Right  femoral intertrochanteric fracture with varus angulation. Electronically Signed   By: Rolm Baptise M.D.   On: 04/09/2020 17:27    Positive ROS: All other systems have been reviewed and were otherwise negative with the exception of those mentioned in the HPI and as above.  Physical Exam: General: Alert, no acute distress Cardiovascular: No pedal edema Respiratory: No cyanosis, no use of accessory musculature GI: No organomegaly, abdomen is soft and non-tender Skin: No lesions in  the area of chief complaint Neurologic: Sensation intact distally Psychiatric: Patient is competent for consent with normal mood and affect Lymphatic: No axillary or cervical lymphadenopathy  MUSCULOSKELETAL:  Right lower extremity is externally rotated and shortened.  Neurovascular intact throughout.  Tenderness noted at the hip.  Assessment: Right hip intertrochanteric fracture.  Plan: -I discussed with the patient and her 2 daughters who are at the bedside, this is an operative injury that would be managed with intramedullary nail of the right hip.  This will allow for immediate weightbearing postoperatively.  We will resume her Plavix immediately following surgery for DVT prophylaxis we will also add 81 mg aspirin for 1 month.  - The risks, benefits, and alternatives were discussed with the patient. There are risks associated with the surgery including, but not limited to, problems with anesthesia (death), infection, differences in leg length/angulation/rotation, fracture of bones, loosening or failure of implants, malunion, nonunion, hematoma (blood accumulation) which may require surgical drainage, blood clots, pulmonary embolism, nerve injury (foot drop), and blood vessel injury. The patient understands these risks and elects to proceed.     Nicholes Stairs, MD Cell [phone]    04/10/2020 12:19 PM

## 2020-04-10 NOTE — Plan of Care (Signed)

## 2020-04-10 NOTE — Op Note (Signed)
Date of Surgery: 04/10/2020  INDICATIONS: [name] is a 86 y.o.-year-old female who sustained a right hip fracture. The risks and benefits of the procedure discussed with the patient prior to the procedure and all questions were answered; consent was obtained.  PREOPERATIVE DIAGNOSIS: right hip fracture   POSTOPERATIVE DIAGNOSIS: Same   PROCEDURE: Treatment of intertrochanteric, pertrochanteric, subtrochanteric fracture with intramedullary implant. CPT [phone]   SURGEON: Dannielle Karvonen. Stann Mainland, M.D.   ANESTHESIA: general   IV FLUIDS AND URINE: See anesthesia record   ESTIMATED BLOOD LOSS: 50 cc  IMPLANTS:   Synthes TFNa 10 x 239mm 105 mm proximal compression screw 5.0 x 36 mm distal screw  DRAINS: None.   COMPLICATIONS: None.   DESCRIPTION OF PROCEDURE: The patient was brought to the operating room and placed supine on the operating table. The patient's leg had been signed prior to the procedure. The patient had the anesthesia placed by the anesthesiologist. The prep verification and incision time-outs were performed to confirm that this was the correct patient, site, side and location. The patient had an SCD on the opposite lower extremity. The patient did receive antibiotics prior to the incision and was re-dosed during the procedure as needed at indicated intervals. The patient was positioned on the fracture table with the table in traction and internal rotation to reduce the hip. The well leg was placed in a scissor position and all bony prominences were well-padded. The patient had the lower extremity prepped and draped in the standard surgical fashion. The incision was made 4 finger breadths superior to the greater trochanter. A guide pin was inserted into the tip of the greater trochanter under fluoroscopic guidance. An opening reamer was used to gain access to the femoral canal. The nail length was measured and inserted down the femoral canal to its proper depth. The appropriate  version of insertion for the lag screw was found under fluoroscopy. A pin was inserted up the femoral neck through the jig. The length of the lag screw was then measured. The lag screw was inserted as near to center-center in the head as possible. The leg was taken out of traction, then the compression screw was used to compress across the fracture. Compression was visualized on serial xrays.   We next turned our attention to the distal interlocking screw.  This was placed through the drill guide of the nail inserter.  A small incision was made overlying the lateral thigh at the screw site, and a tonsil was used to disect down to bone.  A drill pass was made through the jig and across the nail through both cortices.  This was measured, and the appropriate screw was placed under hand power and found to have good bite.    The wound was copiously irrigated with saline and the subcutaneous layer closed with 2.0 monocryl and the skin was reapproximated with staples. The wounds were cleaned and dried a final time and a sterile dressing was placed. The hip was taken through a range of motion at the end of the case under fluoroscopic imaging to visualize the approach-withdraw phenomenon and confirm implant length in the head. The patient was then awakened from anesthesia and taken to the recovery room in stable condition. All counts were correct at the end of the case.   POSTOPERATIVE PLAN: The patient will be weight bearing as tolerated and will return in 2 weeks for staple removal and the patient will receive DVT prophylaxis based on other medications, activity level,  and risk ratio of bleeding to thrombosis.     Geralynn Rile, MD [facility] [place] [phone] 1:40 PM

## 2020-04-10 NOTE — Progress Notes (Signed)
PT Cancellation Note  Patient Details Name: [name] MRN: [id number] DOB: 10-18-1918   Cancelled Treatment:    Reason Eval/Treat Not Completed: Active bedrest order. Per chart, planning for sx today. Will follow-up for PT Evaluation post-op as schedule permits. Please clarify WB orders post-op.  Mabeline Caras, PT, DPT Acute Rehabilitation Services  Pager [phone] Office [phone]  Derry Lory 04/10/2020, 7:39 AM

## 2020-04-10 NOTE — Brief Op Note (Signed)
04/10/2020  1:40 PM  PATIENT:  [name]  86 y.o. female  PRE-OPERATIVE DIAGNOSIS:  closed fracture right hip  POST-OPERATIVE DIAGNOSIS:  closed fracture right hip  PROCEDURE:  Procedure(s): INTRAMEDULLARY (IM) NAIL INTERTROCHANTRIC (Right)  SURGEON:  Surgeon(s) and Role:    * Nicholes Stairs, MD - Primary  PHYSICIAN ASSISTANT:   ASSISTANTS: none   ANESTHESIA:   general  EBL:  50 mL   BLOOD ADMINISTERED:none  DRAINS: none   LOCAL MEDICATIONS USED:  NONE  SPECIMEN:  No Specimen  DISPOSITION OF SPECIMEN:  N/A  COUNTS:  YES  TOURNIQUET:  * No tourniquets in log *  DICTATION: .Note written in EPIC  PLAN OF CARE: Admit to inpatient   PATIENT DISPOSITION:  PACU - hemodynamically stable.   Delay start of Pharmacological VTE agent (>24hrs) due to surgical blood loss or risk of bleeding: not applicable

## 2020-04-10 NOTE — Consult Note (Signed)
WOC Nurse Consult Note: Patient receiving care in [facility] 5N[facility].  Assisted with turning by RN and NT.  Patient's daughter in room. Reason for Consult: :decub ulcer" Wound type: no PIs found to either foot, sacrum or buttocks Pressure Injury POA: Yes/No/NA Measurement: Wound bed: Drainage (amount, consistency, odor)  Periwound: Dressing procedure/placement/frequency: I have ordered the 3 step skin program from the Skin Care Order Set, bilateral Prevalon boots, and a bed with a low air loss feature. Val Riles, RN, MSN, CWOCN, CNS-BC, pager [phone]

## 2020-04-10 NOTE — Progress Notes (Signed)
PROGRESS NOTE        PATIENT DETAILS Name: [name] Age: 86 y.o. Sex: female Date of Birth: 12/03/1918 Admit Date: 04/09/2020 Admitting Physician Chauncey Mann, MD JXB:[id number], Meinder[place], MD  Brief Narrative: Patient is a 86 y.o. female with history of HTN, hypothyroidism, PAD-who sustained a right hip fracture following a mechanical fall.  Significant events: 7/1>> admit to [facility] for mechanical fall-found to have right hip fracture  Significant studies: 7/1>> chest x-ray: Small left pleural effusion with left base atelectasis 7/1>> x-ray bilateral hips: Right femoral intertrochanteric fracture with varus angulation 7/1>> CT head: No acute intracranial abnormality 7/1>> CT C-spine: No acute bony abnormality in the C-spine.  Antimicrobial therapy: None  Microbiology data: None  Procedures : 7/2>> Treatment of intertrochanteric, pertrochanteric, subtrochanteric fracture with intramedullary implant  Consults: Orthopedics  DVT Prophylaxis : SCDs Start: 04/09/20 2050  Subjective: Lying comfortably in bed-pain is controlled-no chest pain or shortness of breath.  Assessment/Plan: Right hip fracture: Following a mechanical fall-evaluated by orthopedics-underwent surgical repair on 7/2-await further recommendations from orthopedics team.  Await evaluation by PT/OT.  HTN: BP controlled-continue losartan/Norvasc  Hypothyroidism: Continue Synthroid  PAD: Stable-s/p PCI in LLE approximately 2017.  Resume antiplatelet agents when okay with orthopedics.  Diet: Diet Order            Diet NPO time specified Except for: Sips with Meds, Ice Chips  Diet effective midnight                  Code Status: Full code   Family Communication: 2 daughters at bedside  Disposition Plan: Status is: Inpatient  Remains inpatient appropriate because:Inpatient level of care appropriate due to severity of illness   Dispo: The patient is from: Home               Anticipated d/c is to: SNF vs HHPT              Anticipated d/c date is: > 3 days              Patient currently is not medically stable to d/c.   Barriers to Discharge: Right hip fracture-s/p operative/surgical repair on 7/2-need PT/OT/SW eval to determine appropriate/safe disposition.  Antimicrobial agents: Anti-infectives (From admission, onward)   Start     Dose/Rate Route Frequency Ordered Stop   04/10/20 0800  ceFAZolin (ANCEF) IVPB 2g/100 mL premix        2 g 200 mL/hr over 30 Minutes Intravenous To ShortStay Surgical 04/09/20 2329 04/10/20 1257       Time spent: 25- minutes-Greater than 50% of this time was spent in counseling, explanation of diagnosis, planning of further management, and coordination of care.  MEDICATIONS: Scheduled Meds: . amLODipine  5 mg Oral QPM  . calcium-vitamin D  1 tablet Oral Q breakfast  . chlorhexidine      . insulin aspart  0-6 Units Subcutaneous Q4H  . levothyroxine  50 mcg Oral QAC breakfast  . losartan  50 mg Oral QPM   Continuous Infusions: . lactated ringers     PRN Meds:.acetaminophen   PHYSICAL EXAM: Vital signs: Vitals:   04/10/20 1350 04/10/20 1405 04/10/20 1420 04/10/20 1445  BP: (!) 157/76 (!) 166/65 (!) 149/59 (!) 155/54  Pulse: 98 (!) 101 99 96  Resp: 17 16 15 15   Temp: 97.8 F (36.6 C)  98 F (  36.7 C) 97.7 F (36.5 C)  TempSrc:    Oral  SpO2: 100% 92% 92% 93%  Weight:      Height:       Filed Weights   04/09/20 1649 04/09/20 1650 04/09/20 2000  Weight: 63.5 kg 65.8 kg 67.2 kg   Body mass index is 25.43 kg/m.   Gen Exam:Alert awake-not in any distress HEENT:atraumatic, normocephalic Chest: B/L clear to auscultation anteriorly CVS:S1S2 regular Abdomen:soft non tender, non distended Extremities:no edema Neurology: Non focal Skin: no rash  I have personally reviewed following labs and imaging studies  LABORATORY DATA: CBC: Recent Labs  Lab 04/09/20 1650 04/10/20 0206  WBC 7.7 10.0    NEUTROABS 5.2  --   HGB 13.0 12.2  HCT 40.7 38.0  MCV 96.7 96.7  PLT 251 329    Basic Metabolic Panel: Recent Labs  Lab 04/09/20 1650 04/09/20 1850 04/10/20 0206  NA 140  --  138  K 3.5  --  4.0  CL 108  --  107  CO2 23  --  21*  GLUCOSE 166*  --  202*  BUN 13  --  16  CREATININE 0.89  --  0.83  CALCIUM 9.2  --  9.3  MG  --  1.9  --   PHOS  --  2.8  --     GFR: Estimated Creatinine Clearance: 34.8 mL/min (by C-G formula based on SCr of 0.83 mg/dL).  Liver Function Tests: Recent Labs  Lab 04/09/20 1650  AST 19  ALT 14  ALKPHOS 60  BILITOT 0.6  PROT 6.3*  ALBUMIN 3.5   No results for input(s): LIPASE, AMYLASE in the last 168 hours. No results for input(s): AMMONIA in the last 168 hours.  Coagulation Profile: Recent Labs  Lab 04/09/20 1650  INR 1.0    Cardiac Enzymes: No results for input(s): CKTOTAL, CKMB, CKMBINDEX, TROPONINI in the last 168 hours.  BNP (last 3 results) No results for input(s): PROBNP in the last 8760 hours.  Lipid Profile: No results for input(s): CHOL, HDL, LDLCALC, TRIG, CHOLHDL, LDLDIRECT in the last 72 hours.  Thyroid Function Tests: No results for input(s): TSH, T4TOTAL, FREET4, T3FREE, THYROIDAB in the last 72 hours.  Anemia Panel: No results for input(s): VITAMINB12, FOLATE, FERRITIN, TIBC, IRON, RETICCTPCT in the last 72 hours.  Urine analysis:    Component Value Date/Time   COLORURINE YELLOW 04/09/2020 1844   APPEARANCEUR HAZY (A) 04/09/2020 1844   LABSPEC 1.013 04/09/2020 1844   PHURINE 6.0 04/09/2020 1844   GLUCOSEU NEGATIVE 04/09/2020 1844   HGBUR SMALL (A) 04/09/2020 1844   BILIRUBINUR NEGATIVE 04/09/2020 1844   KETONESUR 5 (A) 04/09/2020 1844   PROTEINUR NEGATIVE 04/09/2020 1844   NITRITE NEGATIVE 04/09/2020 1844   LEUKOCYTESUR MODERATE (A) 04/09/2020 1844    Sepsis Labs: Lactic Acid, Venous No results found for: LATICACIDVEN  MICROBIOLOGY: Recent Results (from the past 240 hour(s))  SARS  Coronavirus 2 by RT PCR (hospital order, performed in [place] hospital lab) Nasopharyngeal     Status: None   Collection Time: 04/09/20  6:51 PM   Specimen: Nasopharyngeal  Result Value Ref Range Status   SARS Coronavirus 2 NEGATIVE NEGATIVE Final    Comment: (NOTE) SARS-CoV-2 target nucleic acids are NOT DETECTED.  The SARS-CoV-2 RNA is generally detectable in upper and lower respiratory specimens during the acute phase of infection. The lowest concentration of SARS-CoV-2 viral copies this assay can detect is 250 copies / mL. A negative result does not preclude  SARS-CoV-2 infection and should not be used as the sole basis for treatment or other patient management decisions.  A negative result may occur with improper specimen collection / handling, submission of specimen other than nasopharyngeal swab, presence of viral mutation(s) within the areas targeted by this assay, and inadequate number of viral copies (<250 copies / mL). A negative result must be combined with clinical observations, patient history, and epidemiological information.  Fact Sheet for Patients:   StrictlyIdeas.no  Fact Sheet for Healthcare Providers: BankingDealers.co.za  This test is not yet approved or  cleared by the [place] FDA and has been authorized for detection and/or diagnosis of SARS-CoV-2 by FDA under an Emergency Use Authorization (EUA).  This EUA will remain in effect (meaning this test can be used) for the duration of the COVID-19 declaration under Section 564(b)(1) of the Act, 21 U.S.C. section 360bbb-3(b)(1), unless the authorization is terminated or revoked sooner.  Performed at [facility], [place] [address], [place], [place] [address]   Surgical PCR screen     Status: None   Collection Time: 04/09/20 11:24 PM   Specimen: Nasal Mucosa; Nasal Swab  Result Value Ref Range Status   MRSA, PCR NEGATIVE NEGATIVE Final    Staphylococcus aureus NEGATIVE NEGATIVE Final    Comment: (NOTE) The Xpert SA Assay (FDA approved for NASAL specimens in patients 64 years of age and older), is one component of a comprehensive surveillance program. It is not intended to diagnose infection nor to guide or monitor treatment. Performed at [facility], [place] [address], [place], [place] [address]     RADIOLOGY STUDIES/RESULTS: CT Head Wo Contrast  Result Date: 04/09/2020 CLINICAL DATA:  Fall, on anticoagulation. EXAM: CT HEAD WITHOUT CONTRAST CT CERVICAL SPINE WITHOUT CONTRAST TECHNIQUE: Multidetector CT imaging of the head and cervical spine was performed following the standard protocol without intravenous contrast. Multiplanar CT image reconstructions of the cervical spine were also generated. COMPARISON:  None. FINDINGS: CT HEAD FINDINGS Brain: There is atrophy and chronic small vessel disease changes. Old right basal ganglia lacunar infarcts. No acute intracranial abnormality. Specifically, no hemorrhage, hydrocephalus, mass lesion, acute infarction, or significant intracranial injury. Vascular: No hyperdense vessel or unexpected calcification. Skull: No acute calvarial abnormality. Sinuses/Orbits: Visualized paranasal sinuses and mastoids clear. Orbital soft tissues unremarkable. Other: None CT CERVICAL SPINE FINDINGS Alignment: Normal Skull base and vertebrae: No acute fracture. No primary bone lesion or focal pathologic process. Soft tissues and spinal canal: No prevertebral fluid or swelling. No visible canal hematoma. Disc levels: Diffuse advanced degenerative disc disease and facet disease. Upper chest: No acute findings Other: None IMPRESSION: Old right basal ganglia lacunar infarcts. Atrophy, chronic microvascular disease. No acute intracranial abnormality. Degenerative disc and facet disease. No acute bony abnormality in the cervical spine. Electronically Signed   By: Rolm Baptise M.D.   On: 04/09/2020 17:40   CT  Cervical Spine Wo Contrast  Result Date: 04/09/2020 CLINICAL DATA:  Fall, on anticoagulation. EXAM: CT HEAD WITHOUT CONTRAST CT CERVICAL SPINE WITHOUT CONTRAST TECHNIQUE: Multidetector CT imaging of the head and cervical spine was performed following the standard protocol without intravenous contrast. Multiplanar CT image reconstructions of the cervical spine were also generated. COMPARISON:  None. FINDINGS: CT HEAD FINDINGS Brain: There is atrophy and chronic small vessel disease changes. Old right basal ganglia lacunar infarcts. No acute intracranial abnormality. Specifically, no hemorrhage, hydrocephalus, mass lesion, acute infarction, or significant intracranial injury. Vascular: No hyperdense vessel or unexpected calcification. Skull: No acute calvarial  abnormality. Sinuses/Orbits: Visualized paranasal sinuses and mastoids clear. Orbital soft tissues unremarkable. Other: None CT CERVICAL SPINE FINDINGS Alignment: Normal Skull base and vertebrae: No acute fracture. No primary bone lesion or focal pathologic process. Soft tissues and spinal canal: No prevertebral fluid or swelling. No visible canal hematoma. Disc levels: Diffuse advanced degenerative disc disease and facet disease. Upper chest: No acute findings Other: None IMPRESSION: Old right basal ganglia lacunar infarcts. Atrophy, chronic microvascular disease. No acute intracranial abnormality. Degenerative disc and facet disease. No acute bony abnormality in the cervical spine. Electronically Signed   By: Rolm Baptise M.D.   On: 04/09/2020 17:40   DG Chest Portable 1 View  Result Date: 04/09/2020 CLINICAL DATA:  Fall EXAM: PORTABLE CHEST 1 VIEW COMPARISON:  06/28/2014 FINDINGS: Cardiomegaly. Aortic atherosclerosis. Small left pleural effusion suspected with left base atelectasis. No overt edema. Right lung clear. No pneumothorax or acute bony abnormality. IMPRESSION: Small left pleural effusion with left base atelectasis. Cardiomegaly, aortic  atherosclerosis. Electronically Signed   By: Rolm Baptise M.D.   On: 04/09/2020 17:26   DG Hips Bilat W or Wo Pelvis 3-4 Views  Result Date: 04/09/2020 CLINICAL DATA:  Fall EXAM: DG HIP (WITH OR WITHOUT PELVIS) 3-4V BILAT COMPARISON:  None. FINDINGS: There is a right femoral intertrochanteric fracture with mild varus angulation. No subluxation or dislocation. SI joints symmetric. IMPRESSION: Right femoral intertrochanteric fracture with varus angulation. Electronically Signed   By: Rolm Baptise M.D.   On: 04/09/2020 17:27     LOS: 1 day   Oren Binet, MD  [facility]    To contact the attending provider between 7A-7P or the covering provider during after hours 7P-7A, please log into the web site [email or web address] and access using universal [place] password for that web site. If you do not have the password, please call the hospital operator.  04/10/2020, 3:03 PM

## 2020-04-10 NOTE — TOC Initial Note (Signed)
Transition of Care [facility]) - Initial/Assessment Note    Patient Details  Name: Aynsley Fleet MRN: [id number] Date of Birth: 1919/02/28  [facility]) CM/SW Contact:    Sharin Mons, RN Phone Number: [phone] 04/10/2020, 8:39 AM  Clinical Narrative:     Presents s/p  fall, suffered right hip fracture     From home with family.  PTA independent with ADL's , no DME usage. Plan: surgery today (7/2) , operative management of right hip fracture  Angeline Slim (Daughter) Lurline Idol (Son) Archie Patten ( daughter)     [phone]       NCM spoke with son Redmond Pulling and daughter [name] @ bedside regarding d/c planning. Both stated they are not interested in SNF placement post surgery for mom. States has good family support ( 6 girls, 1 son).  Agreeable to [facility] services. NCM provided choice, both without preference. Referral made with [facility] pending MD's orders ... F2F  will be needed from MD if  [facility] services requested.  TOC team will continue to monitor and follow for needs.  Expected Discharge Plan: [place] (Per son family will care for mom @ d/c, not interested in [facility] /Rehab) Barriers to Discharge: Continued Medical Work up   Patient Goals and CMS Choice     Choice offered to / list presented to : Adult Children  Expected Discharge Plan and Services Expected Discharge Plan: [place] (Per son family will care for mom @ d/c, not interested in [facility] /Rehab)   Discharge Planning Services: CM Consult   Living arrangements for the past 2 months: Single Family Home                               Date [place]: 04/10/20 Time [place]: [phone] Representative spoke with at [place]: [place]  Prior Living Arrangements/Services Living arrangements for the past 2 months: [place]                     Activities of Daily Living Home Assistive Devices/Equipment: Shower chair  without back, Dentures (specify type), Hearing aid, Walker (specify type) ADL Screening (condition at time of admission) Patient's cognitive ability adequate to safely complete daily activities?: No Is the patient deaf or have difficulty hearing?: Yes Does the patient have difficulty seeing, even when wearing glasses/contacts?: No Does the patient have difficulty concentrating, remembering, or making decisions?: Yes Patient able to express need for assistance with ADLs?: Yes Does the patient have difficulty dressing or bathing?: Yes Independently performs ADLs?: No Communication: Independent Dressing (OT): Independent Grooming: Needs assistance Is this a change from baseline?: Pre-admission baseline Feeding: Independent Bathing: Needs assistance Is this a change from baseline?: Pre-admission baseline Toileting: Independent In/Out Bed: Independent Walks in Home: Independent Does the patient have difficulty walking or climbing stairs?: No Weakness of Legs: Both Weakness of Arms/Hands: None  Permission Sought/Granted                  Emotional Assessment              Admission diagnosis:  Intertrochanteric fracture ([place]) [S72.143A] Fall, initial encounter [W19.XXXA] Closed fracture of right hip, initial encounter [facility]) [S72.001A] Patient Active Problem List   Diagnosis Date Noted  . Intertrochanteric fracture ([place]) 04/09/2020  . Hypothyroidism 04/09/2020  . PAD (peripheral artery disease) ([place]) 04/09/2020  . Essential hypertension 04/09/2020  .  Fall at home, initial encounter 04/09/2020   PCP:  Lorelee Market, MD Pharmacy:   [facility] [place] [address] Phone: [phone] Fax: [phone]     Social Determinants of Health (SDOH) Interventions    Readmission Risk Interventions No flowsheet data found.

## 2020-04-11 LAB — BASIC METABOLIC PANEL
Anion gap: 8 (ref 5–15)
BUN: 25 mg/dL — ABNORMAL HIGH (ref 8–23)
CO2: 21 mmol/L — ABNORMAL LOW (ref 22–32)
Calcium: 9.4 mg/dL (ref 8.9–10.3)
Chloride: 107 mmol/L (ref 98–111)
Creatinine, Ser: 1.02 mg/dL — ABNORMAL HIGH (ref 0.44–1.00)
GFR calc Af Amer: 53 mL/min — ABNORMAL LOW (ref >60)
GFR calc non Af Amer: 45 mL/min — ABNORMAL LOW (ref >60)
Glucose, Bld: 175 mg/dL — ABNORMAL HIGH (ref 70–99)
Potassium: 4.2 mmol/L (ref 3.5–5.1)
Sodium: 136 mmol/L (ref 135–145)

## 2020-04-11 LAB — CBC
HCT: 35.5 % — ABNORMAL LOW (ref 36.0–46.0)
Hemoglobin: 11.2 g/dL — ABNORMAL LOW (ref 12.0–15.0)
MCH: 30.6 pg (ref 26.0–34.0)
MCHC: 31.5 g/dL (ref 30.0–36.0)
MCV: 97 fL (ref 80.0–100.0)
Platelets: 248 10*3/uL (ref 150–400)
RBC: 3.66 MIL/uL — ABNORMAL LOW (ref 3.87–5.11)
RDW: 13.1 % (ref 11.5–15.5)
WBC: 10.3 10*3/uL (ref 4.0–10.5)
nRBC: 0 % (ref 0.0–0.2)

## 2020-04-11 LAB — GLUCOSE, CAPILLARY
Glucose-Capillary: 132 mg/dL — ABNORMAL HIGH (ref 70–99)
Glucose-Capillary: 147 mg/dL — ABNORMAL HIGH (ref 70–99)
Glucose-Capillary: 152 mg/dL — ABNORMAL HIGH (ref 70–99)
Glucose-Capillary: 158 mg/dL — ABNORMAL HIGH (ref 70–99)
Glucose-Capillary: 201 mg/dL — ABNORMAL HIGH (ref 70–99)
Glucose-Capillary: 238 mg/dL — ABNORMAL HIGH (ref 70–99)

## 2020-04-11 MED ORDER — ENOXAPARIN SODIUM 30 MG/0.3ML ~~LOC~~ SOLN
30.0000 mg | SUBCUTANEOUS | Status: DC
  Administered 2020-04-11 – 2020-04-12 (×2): 30 mg via SUBCUTANEOUS
  Filled 2020-04-11 (×2): qty 0.3

## 2020-04-11 MED ORDER — BACITRACIN-NEOMYCIN-POLYMYXIN OINTMENT TUBE
TOPICAL_OINTMENT | CUTANEOUS | Status: DC | PRN
  Filled 2020-04-11: qty 14

## 2020-04-11 NOTE — Plan of Care (Signed)

## 2020-04-11 NOTE — Plan of Care (Signed)

## 2020-04-11 NOTE — Evaluation (Signed)
Occupational Therapy Evaluation Patient Details Name: [name] MRN: [id number] DOB: December 28, 1918 Today's Date: 04/11/2020    History of Present Illness Pt is a 86 y.o. F with significant PMH of breast cancer and hypertension who sustained a right hip fracture following a fail s/p IMN 04/10/2020.   Clinical Impression   PTA pt living alone with rotating 24/7 supervision from family members. Per daughter (present in session), pt independent with bathing, dressing, laundry and assists family with meal prep. She uses a 3 wheeled walker at baseline. At time of eval, pt requires max A +2 for bed mobility and max A +2- total A +2 for sit <> stands. Pt not able to achieve full upright posture in standing due to pain. Suspect she is also fearful/anxious as well. Family strongly prefers to take patient home. Given current status, pt will need w/c, hoyer lift, BSC, and non emergent transport home. [facility] services will be needed as well. OT will continue to follow per POC listed below.    Follow Up Recommendations  Home health OT;Supervision/Assistance - 24 hour;SNF;Other (comment) (family prefers to take pt home)    Equipment Recommendations  3 in 1 bedside commode;Hospital bed;Other (comment) (hoyer lift with hoyer lift pads)    Recommendations for Other Services       Precautions / Restrictions Precautions Precautions: Fall Restrictions Weight Bearing Restrictions: Yes RLE Weight Bearing: Weight bearing as tolerated      Mobility Bed Mobility Overal bed mobility: Needs Assistance Bed Mobility: Supine to Sit     Supine to sit: Max assist;+2 for physical assistance     General bed mobility comments: maxA + 2 to progress to edge of bed, able to assist with BLE's minimally, cues for use of bed rail  Transfers Overall transfer level: Needs assistance Equipment used: Rolling walker (2 wheeled);Ambulation equipment used Transfers: Sit to/from Stand Sit to Stand: Total assist;+2 physical  assistance         General transfer comment: Several attempts were made to stand, once up to walker and twice up to stedy. Pt able to achieve minimal hip clearance, unable to achieve upright.    Balance Overall balance assessment: Needs assistance Sitting-balance support: Feet supported;Bilateral upper extremity supported Sitting balance-Leahy Scale: Poor Sitting balance - Comments: Supervision-min guard for safety, reliant on BUE support                                   ADL either performed or assessed with clinical judgement   ADL Overall ADL's : Needs assistance/impaired Eating/Feeding: Set up;Sitting   Grooming: Set up;Sitting   Upper Body Bathing: Set up;Sitting   Lower Body Bathing: Maximal assistance;Sitting/lateral leans;Sit to/from stand   Upper Body Dressing : Set up;Sitting   Lower Body Dressing: Maximal assistance;Sitting/lateral leans;Sit to/from stand   Toilet Transfer: Maximal assistance;+2 for physical assistance;+2 for safety/equipment;Squat-pivot;BSC Toilet Transfer Details (indicate cue type and reason): pt with difficulty clearing hips from bed this date, appears dearful with WB of RLE Toileting- Clothing Manipulation and Hygiene: Maximal assistance;Sitting/lateral lean;Sit to/from stand       Functional mobility during ADLs: Maximal assistance;+2 for physical assistance;+2 for safety/equipment General ADL Comments: limited by pain this date, not able to accomplish full stand with max A +2     Vision Patient Visual Report: No change from baseline       Perception     Praxis      Pertinent Vitals/Pain  Pain Assessment: Faces Faces Pain Scale: Hurts whole lot Pain Location: R hip with movement Pain Descriptors / Indicators: Grimacing;Operative site guarding Pain Intervention(s): Limited activity within patient's tolerance;Monitored during session;Repositioned     Hand Dominance     Extremity/Trunk Assessment Upper Extremity  Assessment Upper Extremity Assessment: Generalized weakness   Lower Extremity Assessment Lower Extremity Assessment: Generalized weakness;Defer to PT evaluation RLE Deficits / Details: Right hip fx s/p IMN. Grossly 2/5   Cervical / Trunk Assessment Cervical / Trunk Assessment: Kyphotic   Communication Communication Communication: HOH   Cognition Arousal/Alertness: Awake/alert Behavior During Therapy: WFL for tasks assessed/performed Overall Cognitive Status: Impaired/Different from baseline Area of Impairment: Memory                     Memory: Decreased short-term memory         General Comments: Pt very HOH, noted STM deficits. likely baseline   General Comments  Pt SpO2 stable on RA this session, O2 reapplied at end of session until cleared by MD    Exercises     Shoulder Instructions      Home Living Family/patient expects to be discharged to:: Private residence Living Arrangements: Alone (family takes turns staying overnight with pt) Available Help at Discharge: Family;Available 24 hours/day Type of Home: House Home Access: Stairs to enter [employer] of Steps: 4 low steps Entrance Stairs-Rails: Right;Left Home Layout: One level     Bathroom Shower/Tub: Teacher, early years/pre: Handicapped height     Home Equipment: Civil engineer, contracting;Other (comment);Wheelchair - manual (shower mat, 3 wheeled walker)   Additional Comments: 5 girls, 1 son. Takes shifts caring for pt      Prior Functioning/Environment Level of Independence: Needs assistance  Gait / Transfers Assistance Needed: Uses walker ADL's / Homemaking Assistance Needed: Pt helps with cooking, laundry, dishes. Independent ADL's.             OT Problem List: Decreased strength;Decreased knowledge of use of DME or AE;Decreased knowledge of precautions;Decreased activity tolerance;Decreased cognition;Impaired balance (sitting and/or standing);Decreased safety awareness;Pain       OT Treatment/Interventions: Self-care/ADL training;Therapeutic exercise;Patient/family education;Balance training;Energy conservation;Therapeutic activities;DME and/or AE instruction    OT Goals(Current goals can be found in the care plan section) Acute Rehab OT Goals Patient Stated Goal: go home with family OT Goal Formulation: With patient Time For Goal Achievement: 04/25/20 Potential to Achieve Goals: Good  OT Frequency: Min 2X/week   Barriers to D/C:            Co-evaluation              AM-PAC OT "6 Clicks" Daily Activity     Outcome Measure Help from another person eating meals?: A Little Help from another person taking care of personal grooming?: A Little Help from another person toileting, which includes using toliet, bedpan, or urinal?: A Lot Help from another person bathing (including washing, rinsing, drying)?: Total Help from another person to put on and taking off regular upper body clothing?: A Little Help from another person to put on and taking off regular lower body clothing?: Total 6 Click Score: 13   End of Session Equipment Utilized During Treatment: Gait belt;Rolling walker Nurse Communication: Mobility status;Precautions;Weight bearing status  Activity Tolerance: Patient tolerated treatment well Patient left: in bed;with call bell/phone within reach;with family/visitor present  OT Visit Diagnosis: Unsteadiness on feet (R26.81);Other abnormalities of gait and mobility (R26.89);Muscle weakness (generalized) (M62.81);History of falling (Z91.81);Pain Pain - Right/Left: Right Pain -  part of body: Hip;Leg                Time: 0919-8022 OT Time Calculation (min): 36 min Charges:  OT General Charges $OT Visit: 1 Visit OT Evaluation $OT Eval Moderate Complexity: [place], MSOT, OTR/L Acute Rehabilitation Services [facility] Office Number: [phone] Pager: [phone]  Zenovia Jarred 04/11/2020, 11:22 AM

## 2020-04-11 NOTE — Progress Notes (Signed)
PROGRESS NOTE        PATIENT DETAILS Name: [name] Age: 86 y.o. Sex: female Date of Birth: 01-19-1919 Admit Date: 04/09/2020 Admitting Physician Chauncey Mann, MD YIR:SWNIOEVO, Meindert, MD  Brief Narrative: Patient is a 86 y.o. female with history of HTN, hypothyroidism, PAD-who sustained a right hip fracture following a mechanical fall.  Significant events: 7/1>> admit to [facility] for [facility]-found to have right hip fracture  Significant studies: 7/1>> chest x-ray: Small left pleural effusion with left base atelectasis 7/1>> x-ray bilateral hips: Right femoral intertrochanteric fracture with varus angulation 7/1>> CT head: No acute intracranial abnormality 7/1>> CT C-spine: No acute bony abnormality in the C-spine.  Antimicrobial therapy: None  Microbiology data: None  Procedures : 7/2>> Treatment of intertrochanteric, pertrochanteric, subtrochanteric fracture with intramedullary implant  Consults: Orthopedics  DVT Prophylaxis : enoxaparin (LOVENOX) injection 30 mg Start: 04/11/20 0945 SCDs Start: 04/10/20 1505 SCDs Start: 04/09/20 2050  Subjective: No major issues-pain appears to be controlled.  Assessment/Plan: Right hip fracture: Following a mechanical fall-evaluated by orthopedics-underwent surgical repair on 7/2-spoke with Dr. Rolena Infante on 7/3-okay to start prophylactic Lovenox.  WBAT per orthopedics.  Evaluated by PT/OT-family contemplating taking patient home with home health-but want to see further improvement before finally deciding-otherwise will require SNF.  Anemia: Mild-likely secondary to perioperative blood loss-follow for now.  HTN: BP controlled-continue losartan/Norvasc  Hypothyroidism: Continue Synthroid  PAD: Stable-s/p PCI in LLE approximately 2017.  Spoke with Dr. Worthy Rancher resuming Plavix in the next day or so.  Diet: Diet Order            Diet regular Room service appropriate? Yes; Fluid  consistency: Thin  Diet effective now                  Code Status: Full code   Family Communication: Daughters at bedside  Disposition Plan: Status is: Inpatient  Remains inpatient appropriate because:Inpatient level of care appropriate due to severity of illness   Dispo: The patient is from: Home              Anticipated d/c is to: SNF vs HHPT              Anticipated d/c date is: > 3 days              Patient currently is not medically stable to d/c.   Barriers to Discharge: Right hip fracture-s/p operative/surgical repair on 7/2-unsafe disposition-not sure if patient safe to go home or needs SNF.  Await further recommendations from rehab services  Antimicrobial agents: Anti-infectives (From admission, onward)   Start     Dose/Rate Route Frequency Ordered Stop   04/10/20 1515  ceFAZolin (ANCEF) IVPB 1 g/50 mL premix        1 g 100 mL/hr over 30 Minutes Intravenous Every 6 hours 04/10/20 1504 04/11/20 0423   04/10/20 0800  ceFAZolin (ANCEF) IVPB 2g/100 mL premix        2 g 200 mL/hr over 30 Minutes Intravenous To ShortStay Surgical 04/09/20 2329 04/10/20 1257       Time spent: 25- minutes-Greater than 50% of this time was spent in counseling, explanation of diagnosis, planning of further management, and coordination of care.  MEDICATIONS: Scheduled Meds: . amLODipine  5 mg Oral QPM  . aspirin  81 mg Oral Daily  . calcium-vitamin D  1 tablet  Oral Q breakfast  . docusate sodium  100 mg Oral BID  . enoxaparin (LOVENOX) injection  30 mg Subcutaneous Q24H  . insulin aspart  0-6 Units Subcutaneous Q4H  . levothyroxine  50 mcg Oral QAC breakfast  . losartan  50 mg Oral QPM   Continuous Infusions: . lactated ringers 10 mL/hr at 04/11/20 0843   PRN Meds:.acetaminophen, metoCLOPramide **OR** metoCLOPramide (REGLAN) injection, ondansetron **OR** ondansetron (ZOFRAN) IV   PHYSICAL EXAM: Vital signs: Vitals:   04/10/20 1930 04/11/20 0000 04/11/20 0449 04/11/20 0739   BP: 138/81 135/78 (!) 158/65 (!) 130/58  Pulse: 74 75 (!) 109 85  Resp: 17 16 18 16   Temp: 98 F (36.7 C) 97.7 F (36.5 C) 97.6 F (36.4 C) 98.5 F (36.9 C)  TempSrc: Oral Oral Oral Oral  SpO2: 92% 92% 92% 96%  Weight:      Height:       Filed Weights   04/09/20 1649 04/09/20 1650 04/09/20 2000  Weight: 63.5 kg 65.8 kg 67.2 kg   Body mass index is 25.43 kg/m.   Gen Exam:Alert awake-not in any distress HEENT:atraumatic, normocephalic Chest: B/L clear to auscultation anteriorly CVS:S1S2 regular Abdomen:soft non tender, non distended Extremities:no edema Neurology: Non focal Skin: no rash  I have personally reviewed following labs and imaging studies  LABORATORY DATA: CBC: Recent Labs  Lab 04/09/20 1650 04/10/20 0206 04/11/20 0254  WBC 7.7 10.0 10.3  NEUTROABS 5.2  --   --   HGB 13.0 12.2 11.2*  HCT 40.7 38.0 35.5*  MCV 96.7 96.7 97.0  PLT 251 252 875    Basic Metabolic Panel: Recent Labs  Lab 04/09/20 1650 04/09/20 1850 04/10/20 0206 04/11/20 0254  NA 140  --  138 136  K 3.5  --  4.0 4.2  CL 108  --  107 107  CO2 23  --  21* 21*  GLUCOSE 166*  --  202* 175*  BUN 13  --  16 25*  CREATININE 0.89  --  0.83 1.02*  CALCIUM 9.2  --  9.3 9.4  MG  --  1.9  --   --   PHOS  --  2.8  --   --     GFR: Estimated Creatinine Clearance: 28.3 mL/min (A) (by C-G formula based on SCr of 1.02 mg/dL (H)).  Liver Function Tests: Recent Labs  Lab 04/09/20 1650  AST 19  ALT 14  ALKPHOS 60  BILITOT 0.6  PROT 6.3*  ALBUMIN 3.5   No results for input(s): LIPASE, AMYLASE in the last 168 hours. No results for input(s): AMMONIA in the last 168 hours.  Coagulation Profile: Recent Labs  Lab 04/09/20 1650  INR 1.0    Cardiac Enzymes: No results for input(s): CKTOTAL, CKMB, CKMBINDEX, TROPONINI in the last 168 hours.  BNP (last 3 results) No results for input(s): PROBNP in the last 8760 hours.  Lipid Profile: No results for input(s): CHOL, HDL, LDLCALC,  TRIG, CHOLHDL, LDLDIRECT in the last 72 hours.  Thyroid Function Tests: No results for input(s): TSH, T4TOTAL, FREET4, T3FREE, THYROIDAB in the last 72 hours.  Anemia Panel: No results for input(s): VITAMINB12, FOLATE, FERRITIN, TIBC, IRON, RETICCTPCT in the last 72 hours.  Urine analysis:    Component Value Date/Time   COLORURINE YELLOW 04/09/2020 1844   APPEARANCEUR HAZY (A) 04/09/2020 1844   LABSPEC 1.013 04/09/2020 1844   PHURINE 6.0 04/09/2020 1844   GLUCOSEU NEGATIVE 04/09/2020 1844   HGBUR SMALL (A) 04/09/2020 1844   BILIRUBINUR NEGATIVE  04/09/2020 1844   KETONESUR 5 (A) 04/09/2020 1844   PROTEINUR NEGATIVE 04/09/2020 1844   NITRITE NEGATIVE 04/09/2020 1844   LEUKOCYTESUR MODERATE (A) 04/09/2020 1844    Sepsis Labs: Lactic Acid, Venous No results found for: LATICACIDVEN  MICROBIOLOGY: Recent Results (from the past 240 hour(s))  SARS Coronavirus 2 by RT PCR (hospital order, performed in [facility] hospital lab) Nasopharyngeal     Status: None   Collection Time: 04/09/20  6:51 PM   Specimen: Nasopharyngeal  Result Value Ref Range Status   SARS Coronavirus 2 NEGATIVE NEGATIVE Final    Comment: (NOTE) SARS-CoV-2 target nucleic acids are NOT DETECTED.  The SARS-CoV-2 RNA is generally detectable in upper and lower respiratory specimens during the acute phase of infection. The lowest concentration of SARS-CoV-2 viral copies this assay can detect is 250 copies / mL. A negative result does not preclude SARS-CoV-2 infection and should not be used as the sole basis for treatment or other patient management decisions.  A negative result may occur with improper specimen collection / handling, submission of specimen other than nasopharyngeal swab, presence of viral mutation(s) within the areas targeted by this assay, and inadequate number of viral copies (<250 copies / mL). A negative result must be combined with clinical observations, patient history, and epidemiological  information.  Fact Sheet for Patients:   StrictlyIdeas.no  Fact Sheet for Healthcare Providers: BankingDealers.co.za  This test is not yet approved or  cleared by the [place] FDA and has been authorized for detection and/or diagnosis of SARS-CoV-2 by FDA under an Emergency Use Authorization (EUA).  This EUA will remain in effect (meaning this test can be used) for the duration of the COVID-19 declaration under Section 564(b)(1) of the Act, 21 U.S.C. section 360bbb-3(b)(1), unless the authorization is terminated or revoked sooner.  Performed at [facility], [place] [address] [place], [place] [address]   Surgical PCR screen     Status: None   Collection Time: 04/09/20 11:24 PM   Specimen: Nasal Mucosa; Nasal Swab  Result Value Ref Range Status   MRSA, PCR NEGATIVE NEGATIVE Final   Staphylococcus aureus NEGATIVE NEGATIVE Final    Comment: (NOTE) The Xpert SA Assay (FDA approved for NASAL specimens in patients 31 years of age and older), is one component of a comprehensive surveillance program. It is not intended to diagnose infection nor to guide or monitor treatment. Performed at [facility], [place] [address], [place], [place] [address]     RADIOLOGY STUDIES/RESULTS: CT Head Wo Contrast  Result Date: 04/09/2020 CLINICAL DATA:  Fall, on anticoagulation. EXAM: CT HEAD WITHOUT CONTRAST CT CERVICAL SPINE WITHOUT CONTRAST TECHNIQUE: Multidetector CT imaging of the head and cervical spine was performed following the standard protocol without intravenous contrast. Multiplanar CT image reconstructions of the cervical spine were also generated. COMPARISON:  None. FINDINGS: CT HEAD FINDINGS Brain: There is atrophy and chronic small vessel disease changes. Old right basal ganglia lacunar infarcts. No acute intracranial abnormality. Specifically, no hemorrhage, hydrocephalus, mass lesion, acute infarction, or significant  intracranial injury. Vascular: No hyperdense vessel or unexpected calcification. Skull: No acute calvarial abnormality. Sinuses/Orbits: Visualized paranasal sinuses and mastoids clear. Orbital soft tissues unremarkable. Other: None CT CERVICAL SPINE FINDINGS Alignment: Normal Skull base and vertebrae: No acute fracture. No primary bone lesion or focal pathologic process. Soft tissues and spinal canal: No prevertebral fluid or swelling. No visible canal hematoma. Disc levels: Diffuse advanced degenerative disc disease and facet disease. Upper chest: No acute findings  Other: None IMPRESSION: Old right basal ganglia lacunar infarcts. Atrophy, chronic microvascular disease. No acute intracranial abnormality. Degenerative disc and facet disease. No acute bony abnormality in the cervical spine. Electronically Signed   By: Rolm Baptise M.D.   On: 04/09/2020 17:40   CT Cervical Spine Wo Contrast  Result Date: 04/09/2020 CLINICAL DATA:  Fall, on anticoagulation. EXAM: CT HEAD WITHOUT CONTRAST CT CERVICAL SPINE WITHOUT CONTRAST TECHNIQUE: Multidetector CT imaging of the head and cervical spine was performed following the standard protocol without intravenous contrast. Multiplanar CT image reconstructions of the cervical spine were also generated. COMPARISON:  None. FINDINGS: CT HEAD FINDINGS Brain: There is atrophy and chronic small vessel disease changes. Old right basal ganglia lacunar infarcts. No acute intracranial abnormality. Specifically, no hemorrhage, hydrocephalus, mass lesion, acute infarction, or significant intracranial injury. Vascular: No hyperdense vessel or unexpected calcification. Skull: No acute calvarial abnormality. Sinuses/Orbits: Visualized paranasal sinuses and mastoids clear. Orbital soft tissues unremarkable. Other: None CT CERVICAL SPINE FINDINGS Alignment: Normal Skull base and vertebrae: No acute fracture. No primary bone lesion or focal pathologic process. Soft tissues and spinal canal: No  prevertebral fluid or swelling. No visible canal hematoma. Disc levels: Diffuse advanced degenerative disc disease and facet disease. Upper chest: No acute findings Other: None IMPRESSION: Old right basal ganglia lacunar infarcts. Atrophy, chronic microvascular disease. No acute intracranial abnormality. Degenerative disc and facet disease. No acute bony abnormality in the cervical spine. Electronically Signed   By: Rolm Baptise M.D.   On: 04/09/2020 17:40   DG Chest Portable 1 View  Result Date: 04/09/2020 CLINICAL DATA:  Fall EXAM: PORTABLE CHEST 1 VIEW COMPARISON:  06/28/2014 FINDINGS: Cardiomegaly. Aortic atherosclerosis. Small left pleural effusion suspected with left base atelectasis. No overt edema. Right lung clear. No pneumothorax or acute bony abnormality. IMPRESSION: Small left pleural effusion with left base atelectasis. Cardiomegaly, aortic atherosclerosis. Electronically Signed   By: Rolm Baptise M.D.   On: 04/09/2020 17:26   DG C-Arm 1-60 Min  Result Date: 04/10/2020 CLINICAL DATA:  Internal fixation EXAM: OPERATIVE RIGHT HIP (WITH PELVIS IF PERFORMED) 6 VIEWS TECHNIQUE: Fluoroscopic spot image(s) were submitted for interpretation post-operatively. COMPARISON:  04/09/2020 FINDINGS: Internal fixation across the right femoral intertrochanteric fracture. Near anatomic alignment. No hardware bony complicating feature. IMPRESSION: Internal fixation.  No visible complicating feature. Electronically Signed   By: Rolm Baptise M.D.   On: 04/10/2020 18:51   DG HIP OPERATIVE UNILAT W OR W/O PELVIS RIGHT  Result Date: 04/10/2020 CLINICAL DATA:  Internal fixation EXAM: OPERATIVE RIGHT HIP (WITH PELVIS IF PERFORMED) 6 VIEWS TECHNIQUE: Fluoroscopic spot image(s) were submitted for interpretation post-operatively. COMPARISON:  04/09/2020 FINDINGS: Internal fixation across the right femoral intertrochanteric fracture. Near anatomic alignment. No hardware bony complicating feature. IMPRESSION: Internal  fixation.  No visible complicating feature. Electronically Signed   By: Rolm Baptise M.D.   On: 04/10/2020 18:51   DG Hips Bilat W or Wo Pelvis 3-4 Views  Result Date: 04/09/2020 CLINICAL DATA:  Fall EXAM: DG HIP (WITH OR WITHOUT PELVIS) 3-4V BILAT COMPARISON:  None. FINDINGS: There is a right femoral intertrochanteric fracture with mild varus angulation. No subluxation or dislocation. SI joints symmetric. IMPRESSION: Right femoral intertrochanteric fracture with varus angulation. Electronically Signed   By: Rolm Baptise M.D.   On: 04/09/2020 17:27     LOS: 2 days   Oren Binet, MD  [facility]    To contact the attending provider between 7A-7P or the covering provider during after hours 7P-7A, please log into  the web site [email or web address] and access using universal [place] password for that web site. If you do not have the password, please call the hospital operator.  04/11/2020, 3:47 PM

## 2020-04-11 NOTE — Plan of Care (Signed)

## 2020-04-11 NOTE — Evaluation (Addendum)
Physical Therapy Evaluation Patient Details Name: [name] MRN: [id number] DOB: 12-23-1918 Today's Date: 04/11/2020   History of Present Illness  Pt is a 86 y.o. F with significant PMH of breast cancer and hypertension who sustained a right hip fracture following a fail s/p IMN 04/10/2020.  Clinical Impression  Prior to admission, pt ambulates with a 3 wheeled walker, is independent with ADL's and has 24/7 assist from family members. On PT evaluation, pt presents with decreased functional mobility secondary to debility/deconditioning, RLE weakness, pain, balance impairments, and fear of falling. Requiring two person maximal assist for transition to edge of bed. Pt ultimately unable to stand after multiple attempts with both a walker and lift equipment Denna Haggard). Will continue efforts. Currently, pt would need a hoyer lift to transfer out of bed. Pt will also need ambulance transport home upon discharge.     Follow Up Recommendations Home health PT;SNF (pending progress; pt family preference for home)    Equipment Recommendations  Hospital bed;3in1 (PT);Rolling walker with 5" wheels;Other (comment) (hoyer lift)    Recommendations for Other Services       Precautions / Restrictions Precautions Precautions: Fall Restrictions Weight Bearing Restrictions: Yes RLE Weight Bearing: Weight bearing as tolerated      Mobility  Bed Mobility Overal bed mobility: Needs Assistance Bed Mobility: Supine to Sit     Supine to sit: Max assist;+2 for physical assistance     General bed mobility comments: maxA + 2 to progress to edge of bed, able to assist with BLE's minimally, cues for use of bed rail  Transfers Overall transfer level: Needs assistance Equipment used: Rolling walker (2 wheeled);Ambulation equipment used Transfers: Sit to/from Stand Sit to Stand: Total assist;+2 physical assistance         General transfer comment: Several attempts were made to stand, once up to  walker and twice up to stedy. Pt able to achieve minimal hip clearance, unable to achieve upright.  Ambulation/Gait             General Gait Details: unable  Stairs            Wheelchair Mobility    Modified Rankin (Stroke Patients Only)       Balance Overall balance assessment: Needs assistance Sitting-balance support: Feet supported;Bilateral upper extremity supported Sitting balance-Leahy Scale: Poor Sitting balance - Comments: Supervision-min guard for safety, reliant on BUE support                                     Pertinent Vitals/Pain Pain Assessment: Faces Faces Pain Scale: Hurts whole lot Pain Location: R hip with movement Pain Descriptors / Indicators: Grimacing;Operative site guarding Pain Intervention(s): Limited activity within patient's tolerance;Monitored during session;Repositioned    Home Living Family/patient expects to be discharged to:: Private residence Living Arrangements: Alone Available Help at Discharge: Family;Available 24 hours/day Type of Home: House Home Access: Stairs to enter Entrance Stairs-Rails: Psychiatric nurse of Steps: 4 (low steps) Home Layout: One level Home Equipment: Shower seat;Other (comment);Wheelchair - manual (shower mat, 3 wheeled walker) Additional Comments: 5 girls, 1 son. Takes shifts    Prior Function Level of Independence: Needs assistance   Gait / Transfers Assistance Needed: Uses walker  ADL's / Homemaking Assistance Needed: Pt helps with cooking, laundry, dishes. Independent ADL's.         Hand Dominance        Extremity/Trunk Assessment   Upper  Extremity Assessment Upper Extremity Assessment: Generalized weakness    Lower Extremity Assessment Lower Extremity Assessment: Generalized weakness;RLE deficits/detail RLE Deficits / Details: Right hip fx s/p IMN. Grossly 2/5    Cervical / Trunk Assessment Cervical / Trunk Assessment: Kyphotic  Communication    Communication: HOH  Cognition Arousal/Alertness: Awake/alert Behavior During Therapy: WFL for tasks assessed/performed Overall Cognitive Status: Impaired/Different from baseline Area of Impairment: Memory                     Memory: Decreased short-term memory         General Comments: Pt very HOH, noted STM deficits. likely baseline      General Comments      Exercises     Assessment/Plan    PT Assessment Patient needs continued PT services  PT Problem List Decreased strength;Decreased range of motion;Decreased activity tolerance;Decreased balance;Decreased mobility;Pain       PT Treatment Interventions Gait training;DME instruction;Functional mobility training;Therapeutic activities;Therapeutic exercise;Balance training;Patient/family education;Wheelchair mobility training    PT Goals (Current goals can be found in the Care Plan section)  Acute Rehab PT Goals Patient Stated Goal: go home PT Goal Formulation: With patient/family Time For Goal Achievement: 04/25/20 Potential to Achieve Goals: Fair    Frequency Min 5X/week   Barriers to discharge        Co-evaluation               AM-PAC PT "6 Clicks" Mobility  Outcome Measure Help needed turning from your back to your side while in a flat bed without using bedrails?: Total Help needed moving from lying on your back to sitting on the side of a flat bed without using bedrails?: Total Help needed moving to and from a bed to a chair (including a wheelchair)?: Total Help needed standing up from a chair using your arms (e.g., wheelchair or bedside chair)?: Total Help needed to walk in hospital room?: Total Help needed climbing 3-5 steps with a railing? : Total 6 Click Score: 6    End of Session   Activity Tolerance: Patient limited by pain Patient left: in bed;with call bell/phone within reach Nurse Communication: Mobility status PT Visit Diagnosis: Other abnormalities of gait and mobility  (R26.89);Muscle weakness (generalized) (M62.81);Pain Pain - Right/Left: Right Pain - part of body: Hip    Time: 0924-1000 PT Time Calculation (min) (ACUTE ONLY): 36 min   Charges:   PT Evaluation $PT Eval Moderate Complexity: 1 Mod            Wyona Almas, PT, DPT Acute Rehabilitation Services Pager [phone] Office [phone]   Deno Etienne 04/11/2020, 10:56 AM

## 2020-04-11 NOTE — Progress Notes (Signed)
    Subjective: 1 Day Post-Op Procedure(s) (LRB): INTRAMEDULLARY (IM) NAIL INTERTROCHANTRIC (Right) Patient reports pain as 2 on 0-10 scale.   Denies CP or SOB.  Voiding without difficulty. Positive flatus. Objective: Vital signs in last 24 hours: Temp:  [97.6 F (36.4 C)-98.5 F (36.9 C)] 98.5 F (36.9 C) (07/03 0739) Pulse Rate:  [74-109] 85 (07/03 0739) Resp:  [15-18] 16 (07/03 0739) BP: (130-166)/(54-81) 130/58 (07/03 0739) SpO2:  [92 %-100 %] 96 % (07/03 0739)  Intake/Output from previous day: 07/02 0701 - 07/03 0700 In: 801.6 [I.V.:500; IV Piggyback:301.6] Out: 350 [Urine:300; Blood:50] Intake/Output this shift: No intake/output data recorded.  Labs: Recent Labs    04/09/20 1650 04/10/20 0206 04/11/20 0254  HGB 13.0 12.2 11.2*   Recent Labs    04/10/20 0206 04/11/20 0254  WBC 10.0 10.3  RBC 3.93 3.66*  HCT 38.0 35.5*  PLT 252 248   Recent Labs    04/10/20 0206 04/11/20 0254  NA 138 136  K 4.0 4.2  CL 107 107  CO2 21* 21*  BUN 16 25*  CREATININE 0.83 1.02*  GLUCOSE 202* 175*  CALCIUM 9.3 9.4   Recent Labs    04/09/20 1650  INR 1.0    Physical Exam: Neurologically intact ABD soft Intact pulses distally Incision: dressing C/D/I Body mass index is 25.43 kg/m.   Assessment/Plan: 1 Day Post-Op Procedure(s) (LRB): INTRAMEDULLARY (IM) NAIL INTERTROCHANTRIC (Right) Advance diet Up with therapy  Stable s/p ORIF Out of bed to chair  Dahlia Bailiff for Dr. Melina Schools [facility] [phone] 04/11/2020, 8:06 AM

## 2020-04-12 LAB — GLUCOSE, CAPILLARY
Glucose-Capillary: 105 mg/dL — ABNORMAL HIGH (ref 70–99)
Glucose-Capillary: 129 mg/dL — ABNORMAL HIGH (ref 70–99)
Glucose-Capillary: 134 mg/dL — ABNORMAL HIGH (ref 70–99)
Glucose-Capillary: 135 mg/dL — ABNORMAL HIGH (ref 70–99)
Glucose-Capillary: 159 mg/dL — ABNORMAL HIGH (ref 70–99)
Glucose-Capillary: 168 mg/dL — ABNORMAL HIGH (ref 70–99)
Glucose-Capillary: 200 mg/dL — ABNORMAL HIGH (ref 70–99)

## 2020-04-12 LAB — CBC
HCT: 33.5 % — ABNORMAL LOW (ref 36.0–46.0)
Hemoglobin: 10.7 g/dL — ABNORMAL LOW (ref 12.0–15.0)
MCH: 31.2 pg (ref 26.0–34.0)
MCHC: 31.9 g/dL (ref 30.0–36.0)
MCV: 97.7 fL (ref 80.0–100.0)
Platelets: 243 10*3/uL (ref 150–400)
RBC: 3.43 MIL/uL — ABNORMAL LOW (ref 3.87–5.11)
RDW: 13.2 % (ref 11.5–15.5)
WBC: 9.3 10*3/uL (ref 4.0–10.5)
nRBC: 0 % (ref 0.0–0.2)

## 2020-04-12 NOTE — Progress Notes (Signed)
Ruvi Fullenwider  MRN: [id number] DOB/Age: Jul 29, 1919 86 y.o. [facility] Procedure: Procedure(s) (LRB): INTRAMEDULLARY (IM) NAIL INTERTROCHANTRIC (Right)     Subjective: Sleepy this am according to family member, awakens to my exam Answering questions appropriately Ate a little breakfast  Vital Signs Temp:  [97.7 F (36.5 C)-98.7 F (37.1 C)] 97.9 F (36.6 C) (07/04 0740) Pulse Rate:  [82-90] 90 (07/04 0740) Resp:  [15-16] 15 (07/04 0740) BP: (124-163)/(54-66) 139/64 (07/04 0740) SpO2:  [93 %-98 %] 93 % (07/04 0740)  Lab Results Recent Labs    04/11/20 0254 04/12/20 0821  WBC 10.3 9.3  HGB 11.2* 10.7*  HCT 35.5* 33.5*  PLT 248 243   BMET Recent Labs    04/10/20 0206 04/11/20 0254  NA 138 136  K 4.0 4.2  CL 107 107  CO2 21* 21*  GLUCOSE 202* 175*  BUN 16 25*  CREATININE 0.83 1.02*  CALCIUM 9.3 9.4   INR  Date Value Ref Range Status  04/09/2020 1.0 0.8 - 1.2 Final    Comment:    (NOTE) INR goal varies based on device and disease states. Performed at [facility], [place] [address] [place] [address], [place], [place] [address]      Exam Right hip dressing dry Her right lower extremity has the boot in place, moves toes well Left foot also NVI        Plan Would encourage her to be out of bed to chair She has agreed to try today Continued plans per hospitalist team  [facility] PA-C  04/12/2020, 9:52 AM Contact # [phone]

## 2020-04-12 NOTE — Progress Notes (Signed)
PIV removed d/t infiltration, no PIV access at this time, MD updated.

## 2020-04-12 NOTE — Plan of Care (Signed)

## 2020-04-12 NOTE — Progress Notes (Addendum)
Physical Therapy Treatment Patient Details Name: [name] MRN: [id number] DOB: 10/03/1919 Today's Date: 04/12/2020    History of Present Illness Pt is a 86 y.o. F with significant PMH of breast cancer and hypertension who sustained a right hip fracture following a fail s/p IMN 04/10/2020.    PT Comments    Pt pre-medicated with Tylenol. Session focused on bed level exercises and functional mobility. Pt requiring heavy two person assist for bed mobility and transfers. Able to transfer via low pivot from bed to chair towards left. Still unable to stand. Continues to demonstrate RLE weakness, pain, balance deficits and decreased activity tolerance. She remains likely at a hoyer lift level if pt family chooses to take her home.    Follow Up Recommendations  Home health PT;SNF (pt preference for home)     Equipment Recommendations  Hospital bed;3in1 (PT);Rolling walker with 5" wheels;Other (comment) (hoyer lift)    Recommendations for Other Services       Precautions / Restrictions Precautions Precautions: Fall Restrictions Weight Bearing Restrictions: Yes RLE Weight Bearing: Weight bearing as tolerated    Mobility  Bed Mobility Overal bed mobility: Needs Assistance Bed Mobility: Supine to Sit     Supine to sit: +2 for physical assistance;Total assist     General bed mobility comments: totalA + 2 to progress to edge of bed via helicopter method, use of bed pad  Transfers Overall transfer level: Needs assistance Equipment used: None Transfers: Squat Pivot Transfers     Squat pivot transfers: Max assist;+2 physical assistance     General transfer comment: Initially attempted to stand to walker but pt barely able to clear her bottom and sat back down on edge of bed. Performed low pivot transfer from bed to chair towards left with maxA + 2, max cues for hand/foot placement  Ambulation/Gait             General Gait Details: unable   Stairs              Wheelchair Mobility    Modified Rankin (Stroke Patients Only)       Balance Overall balance assessment: Needs assistance Sitting-balance support: Feet supported;Bilateral upper extremity supported Sitting balance-Leahy Scale: Poor Sitting balance - Comments: Supervision-min guard for safety, reliant on BUE support                                    Cognition Arousal/Alertness: Awake/alert Behavior During Therapy: WFL for tasks assessed/performed Overall Cognitive Status: Impaired/Different from baseline Area of Impairment: Memory                     Memory: Decreased short-term memory         General Comments: Pt very HOH, noted STM deficits. likely baseline      Exercises General Exercises - Lower Extremity Ankle Circles/Pumps: Right;Supine;15 reps Quad Sets: Right;10 reps;Supine Heel Slides: PROM;Right;10 reps;Supine    General Comments        Pertinent Vitals/Pain Pain Assessment: Faces Faces Pain Scale: Hurts whole lot Pain Location: R hip with movement Pain Descriptors / Indicators: Grimacing;Operative site guarding Pain Intervention(s): Limited activity within patient's tolerance;Monitored during session;Premedicated before session;Repositioned;Ice applied    Home Living                      Prior Function            PT Goals (  current goals can now be found in the care plan section) Acute Rehab PT Goals Patient Stated Goal: go home PT Goal Formulation: With patient/family Time For Goal Achievement: 04/25/20 Potential to Achieve Goals: Fair Progress towards PT goals: Progressing toward goals    Frequency    Min 5X/week      PT Plan Current plan remains appropriate    Co-evaluation              AM-PAC PT "6 Clicks" Mobility   Outcome Measure  Help needed turning from your back to your side while in a flat bed without using bedrails?: Total Help needed moving from lying on your back to sitting  on the side of a flat bed without using bedrails?: Total Help needed moving to and from a bed to a chair (including a wheelchair)?: Total Help needed standing up from a chair using your arms (e.g., wheelchair or bedside chair)?: Total Help needed to walk in hospital room?: Total Help needed climbing 3-5 steps with a railing? : Total 6 Click Score: 6    End of Session Equipment Utilized During Treatment: Gait belt Activity Tolerance: Patient limited by pain;Patient limited by fatigue Patient left: in bed;with call bell/phone within reach Nurse Communication: Mobility status PT Visit Diagnosis: Other abnormalities of gait and mobility (R26.89);Muscle weakness (generalized) (M62.81);Pain Pain - Right/Left: Right Pain - part of body: Hip     Time: 1308-6578 PT Time Calculation (min) (ACUTE ONLY): 26 min  Charges:  $Therapeutic Activity: 23-37 mins                       [name], PT, DPT Acute Rehabilitation Services Pager [phone] Office [phone]    [name] 04/12/2020, 1:11 PM

## 2020-04-12 NOTE — Progress Notes (Signed)
PROGRESS NOTE        PATIENT DETAILS Name: [name] Age: 86 y.o. Sex: female Date of Birth: [birth year] Admit Date: 04/09/2020 Admitting Physician Chauncey Mann, MD ONG:[id number], [place], MD  Brief Narrative: Patient is a 86 y.o. female with history of HTN, hypothyroidism, PAD-who sustained a right hip fracture following a mechanical fall.  Significant events: 7/1>> admit to [facility] for mechanical fall-found to have right hip fracture  Significant studies: 7/1>> chest x-ray: Small left pleural effusion with left base atelectasis 7/1>> x-ray bilateral hips: Right femoral intertrochanteric fracture with varus angulation 7/1>> CT head: No acute intracranial abnormality 7/1>> CT C-spine: No acute bony abnormality in the C-spine.  Antimicrobial therapy: None  Microbiology data: None  Procedures : 7/2>> Treatment of intertrochanteric, pertrochanteric, subtrochanteric fracture with intramedullary implant  Consults: Orthopedics  DVT Prophylaxis : enoxaparin (LOVENOX) injection 30 mg Start: 04/11/20 0945 SCDs Start: 04/10/20 1505 SCDs Start: 04/09/20 2050  Subjective: No pain at rest-but continues to have severe pain with movement.  Family still contemplating whether SNF or home health on discharge.  Assessment/Plan: Right hip fracture: Following a mechanical fall-evaluated by orthopedics-underwent surgical repair on 7/2-spoke with Dr. Rolena Infante on 7/3-okay to start prophylactic Lovenox.  WBAT per orthopedics.  Evaluated by PT/OT-family contemplating taking patient home with home health-but want to see further improvement before finally deciding-otherwise will require SNF.  Anemia: Mild-likely secondary to perioperative blood loss-follow for now.  HTN: BP controlled-continue losartan/Norvasc  Hypothyroidism: Continue Synthroid  PAD: Stable-s/p PCI in LLE approximately 2017.  Spoke with Dr. Rolena Infante on 7/3-recommends resuming Plavix in the next day or  so.  Diet: Diet Order            Diet regular Room service appropriate? Yes; Fluid consistency: Thin  Diet effective now                  Code Status: Full code   Family Communication: Daughter at bedside  Disposition Plan: Status is: Inpatient  Remains inpatient appropriate because:Inpatient level of care appropriate due to severity of illness   Dispo: The patient is from: Home              Anticipated d/c is to: SNF vs HHPT              Anticipated d/c date is: > 3 days              Patient currently is not medically stable to d/c.   Barriers to Discharge: Right hip fracture-s/p operative/surgical repair on 7/2-unsafe disposition-not sure if patient safe to go home or needs SNF.  Await further recommendations from rehab services  Antimicrobial agents: Anti-infectives (From admission, onward)   Start     Dose/Rate Route Frequency Ordered Stop   04/10/20 1515  ceFAZolin (ANCEF) IVPB 1 g/50 mL premix        1 g 100 mL/hr over 30 Minutes Intravenous Every 6 hours 04/10/20 1504 04/11/20 0423   04/10/20 0800  ceFAZolin (ANCEF) IVPB 2g/100 mL premix        2 g 200 mL/hr over 30 Minutes Intravenous To ShortStay [facility] 04/09/20 2329 04/10/20 1257       Time spent: 15- minutes-Greater than 50% of this time was spent in counseling, explanation of diagnosis, planning of further management, and coordination of care.  MEDICATIONS: Scheduled Meds: . amLODipine  5 mg  Oral QPM  . aspirin  81 mg Oral Daily  . calcium-vitamin D  1 tablet Oral Q breakfast  . docusate sodium  100 mg Oral BID  . enoxaparin (LOVENOX) injection  30 mg Subcutaneous Q24H  . insulin aspart  0-6 Units Subcutaneous Q4H  . levothyroxine  50 mcg Oral QAC breakfast  . losartan  50 mg Oral QPM   Continuous Infusions: . lactated ringers Stopped (04/12/20 1027)   PRN Meds:.acetaminophen, metoCLOPramide **OR** metoCLOPramide (REGLAN) injection, neomycin-bacitracin-polymyxin, ondansetron **OR**  ondansetron (ZOFRAN) IV   PHYSICAL EXAM: Vital signs: Vitals:   04/12/20 0356 04/12/20 0740 04/12/20 1050 04/12/20 1054  BP: (!) 163/66 139/64    Pulse: 83 90    Resp: 15 15    Temp: 98.2 F (36.8 C) 97.9 F (36.6 C)    TempSrc: Oral Oral    SpO2: 98% 93% 91% 93%  Weight:      Height:       Filed Weights   04/09/20 1649 04/09/20 1650 04/09/20 2000  Weight: 63.5 kg 65.8 kg 67.2 kg   Body mass index is 25.43 kg/m.   Gen Exam:Alert awake-not in any distress HEENT:atraumatic, normocephalic Chest: B/L clear to auscultation anteriorly CVS:S1S2 regular Abdomen:soft non tender, non distended Extremities:no edema Neurology: Non focal Skin: no rash  I have personally reviewed following labs and imaging studies  LABORATORY DATA: CBC: Recent Labs  Lab 04/09/20 1650 04/10/20 0206 04/11/20 0254 04/12/20 0821  WBC 7.7 10.0 10.3 9.3  NEUTROABS 5.2  --   --   --   HGB 13.0 12.2 11.2* 10.7*  HCT 40.7 38.0 35.5* 33.5*  MCV 96.7 96.7 97.0 97.7  PLT 251 252 248 580    Basic Metabolic Panel: Recent Labs  Lab 04/09/20 1650 04/09/20 1850 04/10/20 0206 04/11/20 0254  NA 140  --  138 136  K 3.5  --  4.0 4.2  CL 108  --  107 107  CO2 23  --  21* 21*  GLUCOSE 166*  --  202* 175*  BUN 13  --  16 25*  CREATININE 0.89  --  0.83 1.02*  CALCIUM 9.2  --  9.3 9.4  MG  --  1.9  --   --   PHOS  --  2.8  --   --     GFR: Estimated Creatinine Clearance: 28.3 mL/min (A) (by C-G formula based on SCr of 1.02 mg/dL (H)).  Liver Function Tests: Recent Labs  Lab 04/09/20 1650  AST 19  ALT 14  ALKPHOS 60  BILITOT 0.6  PROT 6.3*  ALBUMIN 3.5   No results for input(s): LIPASE, AMYLASE in the last 168 hours. No results for input(s): AMMONIA in the last 168 hours.  Coagulation Profile: Recent Labs  Lab 04/09/20 1650  INR 1.0    Cardiac Enzymes: No results for input(s): CKTOTAL, CKMB, CKMBINDEX, TROPONINI in the last 168 hours.  BNP (last 3 results) No results for  input(s): PROBNP in the last 8760 hours.  Lipid Profile: No results for input(s): CHOL, HDL, LDLCALC, TRIG, CHOLHDL, LDLDIRECT in the last 72 hours.  Thyroid Function Tests: No results for input(s): TSH, T4TOTAL, FREET4, T3FREE, THYROIDAB in the last 72 hours.  Anemia Panel: No results for input(s): VITAMINB12, FOLATE, FERRITIN, TIBC, IRON, RETICCTPCT in the last 72 hours.  Urine analysis:    Component Value Date/Time   COLORURINE YELLOW 04/09/2020 1844   APPEARANCEUR HAZY (A) 04/09/2020 1844   LABSPEC 1.013 04/09/2020 1844   PHURINE 6.0 04/09/2020  [name] 04/09/2020 1844   HGBUR SMALL (A) 04/09/2020 1844   BILIRUBINUR NEGATIVE 04/09/2020 1844   KETONESUR 5 (A) 04/09/2020 1844   PROTEINUR NEGATIVE 04/09/2020 1844   NITRITE NEGATIVE 04/09/2020 1844   LEUKOCYTESUR MODERATE (A) 04/09/2020 1844    Sepsis Labs: Lactic Acid, Venous No results found for: LATICACIDVEN  MICROBIOLOGY: Recent Results (from the past 240 hour(s))  SARS Coronavirus 2 by RT PCR (hospital order, performed in [place] hospital lab) Nasopharyngeal     Status: None   Collection Time: 04/09/20  6:51 PM   Specimen: Nasopharyngeal  Result Value Ref Range Status   SARS Coronavirus 2 NEGATIVE NEGATIVE Final    Comment: (NOTE) SARS-CoV-2 target nucleic acids are NOT DETECTED.  The SARS-CoV-2 RNA is generally detectable in upper and lower respiratory specimens during the acute phase of infection. The lowest concentration of SARS-CoV-2 viral copies this assay can detect is 250 copies / mL. A negative result does not preclude SARS-CoV-2 infection and should not be used as the sole basis for treatment or other patient management decisions.  A negative result may occur with improper specimen collection / handling, submission of specimen other than nasopharyngeal swab, presence of viral mutation(s) within the areas targeted by this assay, and inadequate number of viral copies (<250 copies /  mL). A negative result must be combined with clinical observations, patient history, and epidemiological information.  Fact Sheet for Patients:   StrictlyIdeas.no  Fact Sheet for Healthcare Providers: BankingDealers.co.za  This test is not yet approved or  cleared by the [place] FDA and has been authorized for detection and/or diagnosis of SARS-CoV-2 by FDA under an Emergency Use Authorization (EUA).  This EUA will remain in effect (meaning this test can be used) for the duration of the COVID-19 declaration under Section 564(b)(1) of the Act, 21 U.S.C. section 360bbb-3(b)(1), unless the authorization is terminated or revoked sooner.  Performed at [facility], [place] [address], [place], [place] [address]   Surgical PCR screen     Status: None   Collection Time: 04/09/20 11:24 PM   Specimen: Nasal Mucosa; Nasal Swab  Result Value Ref Range Status   MRSA, PCR NEGATIVE NEGATIVE Final   Staphylococcus aureus NEGATIVE NEGATIVE Final    Comment: (NOTE) The Xpert SA Assay (FDA approved for NASAL specimens in patients 52 years of age and older), is one component of a comprehensive surveillance program. It is not intended to diagnose infection nor to guide or monitor treatment. Performed at [facility], [place] [address], [place], [place] [address]     RADIOLOGY STUDIES/RESULTS: DG C-Arm 1-60 Min  Result Date: 04/10/2020 CLINICAL DATA:  Internal fixation EXAM: OPERATIVE RIGHT HIP (WITH PELVIS IF PERFORMED) 6 VIEWS TECHNIQUE: Fluoroscopic spot image(s) were submitted for interpretation post-operatively. COMPARISON:  04/09/2020 FINDINGS: Internal fixation across the right femoral intertrochanteric fracture. Near anatomic alignment. No hardware bony complicating feature. IMPRESSION: Internal fixation.  No visible complicating feature. Electronically Signed   By: Rolm Baptise M.D.   On: 04/10/2020 18:51   DG HIP OPERATIVE UNILAT W  OR W/O PELVIS RIGHT  Result Date: 04/10/2020 CLINICAL DATA:  Internal fixation EXAM: OPERATIVE RIGHT HIP (WITH PELVIS IF PERFORMED) 6 VIEWS TECHNIQUE: Fluoroscopic spot image(s) were submitted for interpretation post-operatively. COMPARISON:  04/09/2020 FINDINGS: Internal fixation across the right femoral intertrochanteric fracture. Near anatomic alignment. No hardware bony complicating feature. IMPRESSION: Internal fixation.  No visible complicating feature. Electronically Signed   By: Rolm Baptise M.D.  On: 04/10/2020 18:51     LOS: 3 days   Oren Binet, MD  [facility]    To contact the attending provider between 7A-7P or the covering provider during after hours 7P-7A, please log into the web site [email or web address] and access using universal [place] password for that web site. If you do not have the password, please call the hospital operator.  04/12/2020, 12:05 PM

## 2020-04-13 ENCOUNTER — Encounter (HOSPITAL_COMMUNITY): Payer: Self-pay | Admitting: Family Medicine

## 2020-04-13 ENCOUNTER — Inpatient Hospital Stay (HOSPITAL_COMMUNITY): Payer: Medicare PPO

## 2020-04-13 DIAGNOSIS — S72001A Fracture of unspecified part of neck of right femur, initial encounter for closed fracture: Secondary | ICD-10-CM

## 2020-04-13 DIAGNOSIS — I480 Paroxysmal atrial fibrillation: Secondary | ICD-10-CM

## 2020-04-13 DIAGNOSIS — I5031 Acute diastolic (congestive) heart failure: Secondary | ICD-10-CM

## 2020-04-13 DIAGNOSIS — I10 Essential (primary) hypertension: Secondary | ICD-10-CM

## 2020-04-13 DIAGNOSIS — E039 Hypothyroidism, unspecified: Secondary | ICD-10-CM

## 2020-04-13 LAB — ECHOCARDIOGRAM COMPLETE
Height: 64 in
Weight: 2370.39 oz

## 2020-04-13 LAB — BASIC METABOLIC PANEL
Anion gap: 12 (ref 5–15)
BUN: 30 mg/dL — ABNORMAL HIGH (ref 8–23)
CO2: 24 mmol/L (ref 22–32)
Calcium: 9.1 mg/dL (ref 8.9–10.3)
Chloride: 104 mmol/L (ref 98–111)
Creatinine, Ser: 1.1 mg/dL — ABNORMAL HIGH (ref 0.44–1.00)
GFR calc Af Amer: 48 mL/min — ABNORMAL LOW (ref >60)
GFR calc non Af Amer: 41 mL/min — ABNORMAL LOW (ref >60)
Glucose, Bld: 228 mg/dL — ABNORMAL HIGH (ref 70–99)
Potassium: 4.6 mmol/L (ref 3.5–5.1)
Sodium: 140 mmol/L (ref 135–145)

## 2020-04-13 LAB — GLUCOSE, CAPILLARY
Glucose-Capillary: 150 mg/dL — ABNORMAL HIGH (ref 70–99)
Glucose-Capillary: 128 mg/dL — ABNORMAL HIGH (ref 70–99)
Glucose-Capillary: 153 mg/dL — ABNORMAL HIGH (ref 70–99)
Glucose-Capillary: 161 mg/dL — ABNORMAL HIGH (ref 70–99)
Glucose-Capillary: 166 mg/dL — ABNORMAL HIGH (ref 70–99)

## 2020-04-13 LAB — CBC
HCT: 33.9 % — ABNORMAL LOW (ref 36.0–46.0)
Hemoglobin: 10.5 g/dL — ABNORMAL LOW (ref 12.0–15.0)
MCH: 30.3 pg (ref 26.0–34.0)
MCHC: 31 g/dL (ref 30.0–36.0)
MCV: 98 fL (ref 80.0–100.0)
Platelets: 301 10*3/uL (ref 150–400)
RBC: 3.46 MIL/uL — ABNORMAL LOW (ref 3.87–5.11)
RDW: 13.2 % (ref 11.5–15.5)
WBC: 10.1 10*3/uL (ref 4.0–10.5)
nRBC: 0 % (ref 0.0–0.2)

## 2020-04-13 LAB — MAGNESIUM: Magnesium: 1.9 mg/dL (ref 1.7–2.4)

## 2020-04-13 LAB — BRAIN NATRIURETIC PEPTIDE: B Natriuretic Peptide: 624.6 pg/mL — ABNORMAL HIGH (ref 0.0–100.0)

## 2020-04-13 LAB — TSH: TSH: 2.776 u[IU]/mL (ref 0.350–4.500)

## 2020-04-13 MED ORDER — DILTIAZEM HCL 25 MG/5ML IV SOLN
10.0000 mg | Freq: Four times a day (QID) | INTRAVENOUS | Status: DC | PRN
  Administered 2020-04-13 – 2020-04-15 (×6): 10 mg via INTRAVENOUS
  Filled 2020-04-13 (×8): qty 5

## 2020-04-13 MED ORDER — DILTIAZEM HCL 30 MG PO TABS
60.0000 mg | ORAL_TABLET | Freq: Three times a day (TID) | ORAL | Status: DC
  Administered 2020-04-13: 60 mg via ORAL
  Filled 2020-04-13: qty 2

## 2020-04-13 MED ORDER — DILTIAZEM HCL 30 MG PO TABS: 90.0000 mg | ORAL_TABLET | Freq: Three times a day (TID) | ORAL | Status: DC

## 2020-04-13 MED ORDER — DILTIAZEM HCL 30 MG PO TABS
90.0000 mg | ORAL_TABLET | Freq: Three times a day (TID) | ORAL | Status: DC
  Administered 2020-04-13 – 2020-04-14 (×2): 90 mg via ORAL
  Filled 2020-04-13 (×2): qty 3

## 2020-04-13 MED ORDER — BISACODYL 10 MG RE SUPP
10.0000 mg | Freq: Every day | RECTAL | Status: DC
  Administered 2020-04-13: 10 mg via RECTAL
  Filled 2020-04-13: qty 1

## 2020-04-13 MED ORDER — MAGNESIUM HYDROXIDE 400 MG/5ML PO SUSP
30.0000 mL | Freq: Two times a day (BID) | ORAL | Status: DC
  Administered 2020-04-13: 30 mL via ORAL
  Filled 2020-04-13: qty 30

## 2020-04-13 MED ORDER — LOSARTAN POTASSIUM 50 MG PO TABS
25.0000 mg | ORAL_TABLET | Freq: Every evening | ORAL | Status: DC
  Administered 2020-04-13: 25 mg via ORAL
  Filled 2020-04-13: qty 1

## 2020-04-13 MED ORDER — APIXABAN 2.5 MG PO TABS
2.5000 mg | ORAL_TABLET | Freq: Two times a day (BID) | ORAL | Status: DC
  Administered 2020-04-13 – 2020-04-15 (×5): 2.5 mg via ORAL
  Filled 2020-04-13 (×5): qty 1

## 2020-04-13 MED ORDER — POLYETHYLENE GLYCOL 3350 17 G PO PACK
17.0000 g | PACK | Freq: Two times a day (BID) | ORAL | Status: DC
  Administered 2020-04-13 – 2020-04-14 (×2): 17 g via ORAL
  Filled 2020-04-13 (×3): qty 1

## 2020-04-13 MED ORDER — METOPROLOL TARTRATE 5 MG/5ML IV SOLN
5.0000 mg | Freq: Three times a day (TID) | INTRAVENOUS | Status: DC | PRN
  Administered 2020-04-13: 5 mg via INTRAVENOUS
  Filled 2020-04-13 (×2): qty 5

## 2020-04-13 NOTE — Progress Notes (Signed)
Subjective: 3 Days Post-Op Procedure(s) (LRB): INTRAMEDULLARY (IM) NAIL INTERTROCHANTRIC (Right) Patient reports pain as moderate.   Tolerating PO without N/V +void Denies calf pain, Cp, SOB Daughter at bedside.   Objective: Vital signs in last 24 hours: Temp:  [97.7 F (36.5 C)-98.7 F (37.1 C)] 97.9 F (36.6 C) (07/05 0830) Pulse Rate:  [79-154] 142 (07/05 0839) Resp:  [14-18] 18 (07/05 0830) BP: (110-132)/(57-68) 120/60 (07/05 0830) SpO2:  [91 %-98 %] 97 % (07/05 0830)  Intake/Output from previous day: 07/04 0701 - 07/05 0700 In: 41.1 [I.V.:41.1] Out: 1 [Urine:1] Intake/Output this shift: Total I/O In: -  Out: 400 [Urine:400]  Recent Labs    04/11/20 0254 04/12/20 0821 04/13/20 0859  HGB 11.2* 10.7* 10.5*   Recent Labs    04/12/20 0821 04/13/20 0859  WBC 9.3 10.1  RBC 3.43* 3.46*  HCT 33.5* 33.9*  PLT 243 301   Recent Labs    04/11/20 0254  NA 136  K 4.2  CL 107  CO2 21*  BUN 25*  CREATININE 1.02*  GLUCOSE 175*  CALCIUM 9.4   No results for input(s): LABPT, INR in the last 72 hours.  AAOx3 Right hip dressing dry Her right lower extremity has the boot in place, moves toes well Left foot also NVI   Assessment/Plan: 3 Days Post-Op Procedure(s) (LRB): INTRAMEDULLARY (IM) NAIL INTERTROCHANTRIC (Right)  Advance diet Up with therapy  Stable s/p ORIF Out of bed to chair  DVT Ppx: Teds, SCDs, pharmacy constulted for anticoagulation  Stable from an ortho standpoint. Dispo per medicine team  Yvonne Kendall Rosmery Duggin 04/13/2020, 9:50 AM

## 2020-04-13 NOTE — Progress Notes (Signed)
PROGRESS NOTE        PATIENT DETAILS Name: [name] Age: 86 y.o. Sex: female Date of Birth: 03-Mar-1919 Admit Date: 04/09/2020 Admitting Physician Chauncey Mann, MD MQK:[id number], Meinder[place], MD  Brief Narrative: Patient is a 86 y.o. female with history of HTN, hypothyroidism, PAD-who sustained a right hip fracture following a mechanical fall.  Significant events: 7/1>> admit to [name] for mechanical fall-found to have right hip fracture  Significant studies: 7/1>> chest x-ray: Small left pleural effusion with left base atelectasis 7/1>> x-ray bilateral hips: Right femoral intertrochanteric fracture with varus angulation 7/1>> CT head: No acute intracranial abnormality 7/1>> CT C-spine: No acute bony abnormality in the C-spine.  Antimicrobial therapy: None  Microbiology data: None  Procedures : 7/2>> Treatment of intertrochanteric, pertrochanteric, subtrochanteric fracture with intramedullary implant  Consults: Orthopedics  DVT Prophylaxis : apixaban (ELIQUIS) tablet 2.5 mg Start: 04/13/20 1000 SCDs Start: 04/10/20 1505 SCDs Start: 04/09/20 2050  Subjective:  Patient in bed, appears comfortable, denies any headache, no fever, no chest pain or pressure, no shortness of breath , no abdominal pain. No focal weakness.  Assessment/Plan:  Right hip fracture: Following a mechanical fall-evaluated by orthopedics-underwent surgical repair on 7/2-spoke with Dr. Rolena Infante on 7/3-okay to start prophylactic Lovenox.  WBAT per orthopedics.  Evaluated by PT/OT-family contemplating taking patient home with home health-but want to see further improvement before finally deciding-otherwise will require SNF.  Anemia: Mild-likely secondary to perioperative blood loss-follow for now.  HTN: BP controlled-continue losartan/Norvasc  Hypothyroidism: Continue Synthroid  PAD: Stable-s/p PCI in LLE approximately 2017.  Spoke with Dr. Rolena Infante on 7/3-recommends resuming  Plavix in the next day or so.   Paroxysmal A. fib RVR with [place] vas 2 score of greater than 3.  TSH stable, echo ordered, placed on oral Cardizem along with as needed IV Lopressor and as needed IV Cardizem.  Monitor on telemetry.  May require Cardizem drip.  Will monitor.  Started on low-dose Eliquis.   Diet: Diet Order            Diet regular Room service appropriate? Yes; Fluid consistency: Thin  Diet effective now                  Code Status: Full code   Family Communication:  Daughter at bedside on 04/13/2020, risks and benefits of Eliquis explained and she accepts it.  She wants to take her mother home with home health.  Does not want SNF.  Disposition Plan: Status is: Inpatient  Remains inpatient appropriate because:Inpatient level of care appropriate due to severity of illness   Dispo: The patient is from: Home              Anticipated d/c is to: SNF vs HHPT              Anticipated d/c date is: > 1-2 days              Patient currently is not medically stable to d/c.   Barriers to Discharge: Right hip fracture-s/p operative/surgical repair on 7/2-unsafe disposition-not sure if patient safe to go home or needs SNF.  Await further recommendations from rehab services  Antimicrobial agents: Anti-infectives (From admission, onward)   Start     Dose/Rate Route Frequency Ordered Stop   04/10/20 1515  ceFAZolin (ANCEF) IVPB 1 g/50 mL premix  1 g 100 mL/hr over 30 Minutes Intravenous Every 6 hours 04/10/20 1504 04/11/20 0423   04/10/20 0800  ceFAZolin (ANCEF) IVPB 2g/100 mL premix        2 g 200 mL/hr over 30 Minutes Intravenous To ShortStay [facility] 04/09/20 2329 04/10/20 1257       Time spent: 15- minutes-Greater than 50% of this time was spent in counseling, explanation of diagnosis, planning of further management, and coordination of care.  MEDICATIONS: Scheduled Meds:  apixaban  2.5 mg Oral BID   bisacodyl  10 mg Rectal Daily   calcium-vitamin D  1  tablet Oral Q breakfast   diltiazem  90 mg Oral Q8H   docusate sodium  100 mg Oral BID   insulin aspart  0-6 Units Subcutaneous Q4H   levothyroxine  50 mcg Oral QAC breakfast   losartan  25 mg Oral QPM   magnesium hydroxide  30 mL Oral BID   polyethylene glycol  17 g Oral BID   Continuous Infusions:  PRN Meds:.acetaminophen, diltiazem, metoprolol tartrate, neomycin-bacitracin-polymyxin, [DISCONTINUED] ondansetron **OR** ondansetron (ZOFRAN) IV   PHYSICAL EXAM: Vital signs: Vitals:   04/13/20 1041 04/13/20 1057 04/13/20 1103 04/13/20 1108  BP: 108/67 125/64 132/65 133/67  Pulse: (!) 147 (!) 118 (!) 142   Resp: 18     Temp: 98.2 F (36.8 C)     TempSrc: Oral     SpO2: 90%     Weight:      Height:       Filed Weights   04/09/20 1649 04/09/20 1650 04/09/20 2000  Weight: 63.5 kg 65.8 kg 67.2 kg   Body mass index is 25.43 kg/m.   Gen Exam:  Awake mildly confused, No new F.N deficits,   [place].AT,PERRAL Supple Neck,No JVD, No cervical lymphadenopathy appriciated.  Symmetrical Chest wall movement, Good air movement bilaterally, CTAB iRRR,No Gallops, Rubs or new Murmurs, No Parasternal Heave +ve B.Sounds, Abd Soft, No tenderness, No organomegaly appriciated, No rebound - guarding or rigidity. No Cyanosis, Clubbing or edema, right hip postop site appears stable with no signs of significant bleed or hematoma   I have personally reviewed following labs and imaging studies  LABORATORY DATA: CBC: Recent Labs  Lab 04/09/20 1650 04/10/20 0206 04/11/20 0254 04/12/20 0821 04/13/20 0859  WBC 7.7 10.0 10.3 9.3 10.1  NEUTROABS 5.2  --   --   --   --   HGB 13.0 12.2 11.2* 10.7* 10.5*  HCT 40.7 38.0 35.5* 33.5* 33.9*  MCV 96.7 96.7 97.0 97.7 98.0  PLT 251 252 248 243 124    Basic Metabolic Panel: Recent Labs  Lab 04/09/20 1650 04/09/20 1850 04/10/20 0206 04/11/20 0254 04/13/20 0859  NA 140  --  138 136 140  K 3.5  --  4.0 4.2 4.6  CL 108  --  107 107 104  CO2  23  --  21* 21* 24  GLUCOSE 166*  --  202* 175* 228*  BUN 13  --  16 25* 30*  CREATININE 0.89  --  0.83 1.02* 1.10*  CALCIUM 9.2  --  9.3 9.4 9.1  MG  --  1.9  --   --  1.9  PHOS  --  2.8  --   --   --     GFR: Estimated Creatinine Clearance: 26.3 mL/min (A) (by C-G formula based on SCr of 1.1 mg/dL (H)).  Liver Function Tests: Recent Labs  Lab 04/09/20 1650  AST 19  ALT 14  ALKPHOS 60  BILITOT 0.6  PROT 6.3*  ALBUMIN 3.5   No results for input(s): LIPASE, AMYLASE in the last 168 hours. No results for input(s): AMMONIA in the last 168 hours.  Coagulation Profile: Recent Labs  Lab 04/09/20 1650  INR 1.0    Cardiac Enzymes: No results for input(s): CKTOTAL, CKMB, CKMBINDEX, TROPONINI in the last 168 hours.  BNP (last 3 results) No results for input(s): PROBNP in the last 8760 hours.  Lipid Profile: No results for input(s): CHOL, HDL, LDLCALC, TRIG, CHOLHDL, LDLDIRECT in the last 72 hours.  Thyroid Function Tests: Recent Labs    04/13/20 0859  TSH 2.776    Anemia Panel: No results for input(s): VITAMINB12, FOLATE, FERRITIN, TIBC, IRON, RETICCTPCT in the last 72 hours.  Urine analysis:    Component Value Date/Time   COLORURINE YELLOW 04/09/2020 1844   APPEARANCEUR HAZY (A) 04/09/2020 1844   LABSPEC 1.013 04/09/2020 1844   PHURINE 6.0 04/09/2020 1844   GLUCOSEU NEGATIVE 04/09/2020 1844   HGBUR SMALL (A) 04/09/2020 1844   BILIRUBINUR NEGATIVE 04/09/2020 1844   KETONESUR 5 (A) 04/09/2020 1844   PROTEINUR NEGATIVE 04/09/2020 1844   NITRITE NEGATIVE 04/09/2020 1844   LEUKOCYTESUR MODERATE (A) 04/09/2020 1844    Sepsis Labs: Lactic Acid, Venous No results found for: LATICACIDVEN  MICROBIOLOGY: Recent Results (from the past 240 hour(s))  SARS Coronavirus 2 by RT PCR (hospital order, performed in [place] hospital lab) Nasopharyngeal     Status: None   Collection Time: 04/09/20  6:51 PM   Specimen: Nasopharyngeal  Result Value Ref Range Status    SARS Coronavirus 2 NEGATIVE NEGATIVE Final    Comment: (NOTE) SARS-CoV-2 target nucleic acids are NOT DETECTED.  The SARS-CoV-2 RNA is generally detectable in upper and lower respiratory specimens during the acute phase of infection. The lowest concentration of SARS-CoV-2 viral copies this assay can detect is 250 copies / mL. A negative result does not preclude SARS-CoV-2 infection and should not be used as the sole basis for treatment or other patient management decisions.  A negative result may occur with improper specimen collection / handling, submission of specimen other than nasopharyngeal swab, presence of viral mutation(s) within the areas targeted by this assay, and inadequate number of viral copies (<250 copies / mL). A negative result must be combined with clinical observations, patient history, and epidemiological information.  Fact Sheet for Patients:   StrictlyIdeas.no  Fact Sheet for Healthcare Providers: BankingDealers.co.za  This test is not yet approved or  cleared by the [place] FDA and has been authorized for detection and/or diagnosis of SARS-CoV-2 by FDA under an Emergency Use Authorization (EUA).  This EUA will remain in effect (meaning this test can be used) for the duration of the COVID-19 declaration under Section 564(b)(1) of the Act, 21 U.S.C. section 360bbb-3(b)(1), unless the authorization is terminated or revoked sooner.  Performed at [facility], [place] [address] [place], [place] [address]   Surgical PCR screen     Status: None   Collection Time: 04/09/20 11:24 PM   Specimen: Nasal Mucosa; Nasal Swab  Result Value Ref Range Status   MRSA, PCR NEGATIVE NEGATIVE Final   Staphylococcus aureus NEGATIVE NEGATIVE Final    Comment: (NOTE) The Xpert SA Assay (FDA approved for NASAL specimens in patients 35 years of age and older), is one component of a comprehensive surveillance program. It is  not intended to diagnose infection nor to guide or monitor treatment. Performed at [facility], [place]  [address], [place], [place] [address]     RADIOLOGY STUDIES/RESULTS: DG Chest Port 1 View  Result Date: 04/13/2020 CLINICAL DATA:  Shortness of breath, recent hip fracture surgery EXAM: PORTABLE CHEST 1 VIEW COMPARISON:  04/09/2020 FINDINGS: Similar small left effusion with left basilar atelectasis versus scarring. Stable cardiomegaly with vascular congestion. No new significant collapse or consolidation. No developing CHF pattern. Negative for pneumothorax. Aorta atherosclerotic. Bones are osteopenic and degenerative changes of the spine. IMPRESSION: Stable small left effusion with left basilar atelectasis Similar cardiomegaly with vascular congestion No significant interval change. Electronically Signed   By: Jerilynn Mages.  Shick M.D.   On: 04/13/2020 10:10     LOS: 4 days   Signature  Lala Lund M.D on 04/13/2020 at 11:43 AM   -  To page go to [email or web address]

## 2020-04-13 NOTE — Plan of Care (Signed)

## 2020-04-13 NOTE — Progress Notes (Signed)
  Echocardiogram 2D Echocardiogram has been performed.  Michiel Cowboy 04/13/2020, 10:44 AM

## 2020-04-13 NOTE — Plan of Care (Signed)

## 2020-04-13 NOTE — Progress Notes (Signed)
   04/13/20 0830  Assess: MEWS Score  Temp 97.9 F (36.6 C)  BP 120/60  Pulse Rate (!) 154  Resp 18  Level of Consciousness Alert  SpO2 97 %  O2 Device Room Air  Assess: MEWS Score  MEWS Temp 0  MEWS Systolic 0  MEWS Pulse 3  MEWS RR 0  MEWS LOC 0  MEWS Score 3  MEWS Score Color Yellow  Assess: if the MEWS score is Yellow or Red  Were vital signs taken at a resting state? Yes  Focused Assessment Documented focused assessment  Early Detection of Sepsis Score *See Row Information* Low  MEWS guidelines implemented *See Row Information* Yes  Treat  MEWS Interventions Administered prn meds/treatments  Take Vital Signs  Increase Vital Sign Frequency  Yellow: Q 2hr X 2 then Q 4hr X 2, if remains yellow, continue Q 4hrs  Escalate  MEWS: Escalate Yellow: discuss with charge nurse/RN and consider discussing with provider and RRT  Notify: Charge Nurse/RN  Name of Charge Nurse/RN Notified Arley, RN  Date Charge Nurse/RN Notified 04/13/20  Time Charge Nurse/RN Notified 0831  Notify: Provider  Provider Name/Title Dr. Candiss Norse  Date Provider Notified 04/13/20  Time Provider Notified [phone]  Notification Type Face-to-face (secure chat first then face to face)  Notification Reason Change in status  Response See new orders  Date of Provider Response 04/13/20  Time of Provider Response 0840  Notify: Rapid Response  Name of Rapid Response RN Notified n/a  Document  Patient Outcome Other (Comment) (Pt is still being monitored for improvement)   Charge RN and MD notified, new orders received, due meds given, Pt denies SOB, no chest pain. Daughter at bedside updated. Will continue to monitor.

## 2020-04-13 NOTE — Progress Notes (Signed)
ANTICOAGULATION CONSULT NOTE - Initial Consult  Pharmacy Consult for apixaban Indication: atrial fibrillation  Allergies  Allergen Reactions  . Codeine Nausea And Vomiting    Patient Measurements: Height: 5\' 4"  (162.6 cm) Weight: 67.2 kg (148 lb 2.4 oz) IBW/kg (Calculated) : 54.7  Vital Signs: Temp: 97.9 F (36.6 C) (07/05 0830) Temp Source: Oral (07/05 0830) BP: 120/60 (07/05 0830) Pulse Rate: 142 (07/05 0839)  Labs: Recent Labs    04/11/20 0254 04/12/20 0821  HGB 11.2* 10.7*  HCT 35.5* 33.5*  PLT 248 243  CREATININE 1.02*  --     Estimated Creatinine Clearance: 28.3 mL/min (A) (by C-G formula based on SCr of 1.02 mg/dL (H)).   Medical History: Past Medical History:  Diagnosis Date  . Cancer [facility])    Breast Cancer  . Hypertension   . Macular degeneration     Assessment: 86 year old female with history of PVD presenting with hip fracture after mechanical fall and found to have nonvalvular atrial fibrillation. Pharmacy has been consulted for apixaban dosing.   Last enoxaparin dose 7/4 at 0830. Hgb low stable at 10.7 and platelets WNL. No overt bleeding noted. Last SCr <1.5 mg/dL, age >80 years, body weight >60 kg would suggest apixaban 5mg  PO BID, but weight is close to threshold for dose reduction and physician requests conservative anticoagulant dosing in this patient.  Goal of Therapy:  Monitor platelets by anticoagulation protocol: Yes  Plan:  Start apixaban 2.5mg  PO twice daily D/c enoxaparin Monitor CBC, Continue to monitor for signs/symptoms of bleeding   Brendolyn Patty, PharmD Clinical Pharmacist 04/13/2020,9:20 AM  Please check AMION for all [place] phone numbers After 10:00 PM, call the [place] [phone]

## 2020-04-13 NOTE — TOC Progression Note (Signed)
Transition of Care [facility]) - Progression Note    Patient Details  Name: [name] MRN: [id number] Date of Birth: September 23, 1919  Transition of Care [facility]) CM/SW Contact  Sharin Mons, RN Phone Number: 04/13/2020, 1:01 PM  Clinical Narrative:    Referral made with [employer] for DME, 3in1/BSC, rolling walker, hospital bed . Liaison states equipment will be delivered to pt's home on tomorrow, 7/6.  NCM received consult : Please provide 1 month coupon for Eliquis. NCM to provide family with Eliquis 30 day free card.  TOC team will continue to monitor for needs....  Expected Discharge Plan: [place] (Per son family will care for mom @ d/c, not interested in [facility] /Rehab) Barriers to Discharge: Continued Medical Work up  Expected Discharge Plan and Services Expected Discharge Plan: [place] (Per son family will care for mom @ d/c, not interested in [facility] /Rehab)   Discharge Planning Services: CM Consult   Living arrangements for the past 2 months: [place]                 DME Arranged: 3-N-1, Walker rolling, Hospital bed DME Agency: [employer] Date DME Agency Contacted: 04/13/20 Time DME Agency Contacted: [phone] Representative spoke with at DME Agency: Freda Munro HH Arranged: PT, OT [place] Agency: [facility] [employer]) Date [place]: 04/13/20 Time [place]: 0845 Representative spoke with at [place]: [place] ([place]) Interventions    Readmission Risk Interventions No flowsheet data found.

## 2020-04-13 NOTE — Progress Notes (Signed)
OT Cancellation Note  Patient Details Name: [name] MRN: [id number] DOB: [birth year]   Cancelled Treatment:    Reason Eval/Treat Not Completed: Medical issues which prohibited therapy (Pt with new onset afib with RVR. HR in 160s intermittently.) Will follow.  Malka So 04/13/2020, 12:02 PM  Nestor Lewandowsky, OTR/L Acute Rehabilitation Services Pager: [phone] Office: [phone]

## 2020-04-14 ENCOUNTER — Inpatient Hospital Stay (HOSPITAL_COMMUNITY): Payer: Medicare PPO

## 2020-04-14 LAB — GLUCOSE, CAPILLARY
Glucose-Capillary: 143 mg/dL — ABNORMAL HIGH (ref 70–99)
Glucose-Capillary: 160 mg/dL — ABNORMAL HIGH (ref 70–99)
Glucose-Capillary: 164 mg/dL — ABNORMAL HIGH (ref 70–99)
Glucose-Capillary: 165 mg/dL — ABNORMAL HIGH (ref 70–99)
Glucose-Capillary: 171 mg/dL — ABNORMAL HIGH (ref 70–99)
Glucose-Capillary: 99 mg/dL (ref 70–99)

## 2020-04-14 LAB — BASIC METABOLIC PANEL
Anion gap: 10 (ref 5–15)
BUN: 34 mg/dL — ABNORMAL HIGH (ref 8–23)
CO2: 25 mmol/L (ref 22–32)
Calcium: 9 mg/dL (ref 8.9–10.3)
Chloride: 102 mmol/L (ref 98–111)
Creatinine, Ser: 1.08 mg/dL — ABNORMAL HIGH (ref 0.44–1.00)
GFR calc Af Amer: 49 mL/min — ABNORMAL LOW (ref >60)
GFR calc non Af Amer: 42 mL/min — ABNORMAL LOW (ref >60)
Glucose, Bld: 113 mg/dL — ABNORMAL HIGH (ref 70–99)
Potassium: 4.8 mmol/L (ref 3.5–5.1)
Sodium: 137 mmol/L (ref 135–145)

## 2020-04-14 LAB — CBC
HCT: 33.5 % — ABNORMAL LOW (ref 36.0–46.0)
Hemoglobin: 10.6 g/dL — ABNORMAL LOW (ref 12.0–15.0)
MCH: 31.1 pg (ref 26.0–34.0)
MCHC: 31.6 g/dL (ref 30.0–36.0)
MCV: 98.2 fL (ref 80.0–100.0)
Platelets: 355 10*3/uL (ref 150–400)
RBC: 3.41 MIL/uL — ABNORMAL LOW (ref 3.87–5.11)
RDW: 13.4 % (ref 11.5–15.5)
WBC: 10.1 10*3/uL (ref 4.0–10.5)
nRBC: 0 % (ref 0.0–0.2)

## 2020-04-14 MED ORDER — DIGOXIN 0.25 MG/ML IJ SOLN
0.1250 mg | Freq: Four times a day (QID) | INTRAMUSCULAR | Status: AC
  Administered 2020-04-14 (×2): 0.125 mg via INTRAVENOUS
  Filled 2020-04-14 (×2): qty 0.5

## 2020-04-14 MED ORDER — DILTIAZEM HCL 30 MG PO TABS
120.0000 mg | ORAL_TABLET | Freq: Three times a day (TID) | ORAL | Status: DC
  Administered 2020-04-14 – 2020-04-15 (×3): 120 mg via ORAL
  Filled 2020-04-14 (×4): qty 4

## 2020-04-14 NOTE — Progress Notes (Signed)
Physical Therapy Treatment Patient Details Name: [name] MRN: [id number] DOB: 21-Sep-1919 Today's Date: 04/14/2020    History of Present Illness Pt is a 86 y.o. F with significant PMH of breast cancer and hypertension who sustained a right hip fracture following a fail s/p IMN 04/10/2020.    PT Comments    Patient progressing slowly towards PT goals. Continues to require max A of 2 for bed mobility and standing transfers. Use of stedy to transfer pt to chair. Pt limited mainly by pain, weakness and fatigue. Able to engage in ADLs sitting EOB with close Min guard for balance. HR up to 153 bpm, A-fib with activity. Tolerated there ex in bed prior to mobility. Very HOH and needs repetition of cues to follow commands. Family plans to take pt home so d/c recommendation updated to Home with 24/7 supervision/assist and HHPT.  Will follow.   Follow Up Recommendations  Home health PT;Supervision for mobility/OOB;Supervision/Assistance - 24 hour     Equipment Recommendations  Hospital bed;3in1 (PT);Rolling walker with 5" wheels;Other (comment)    Recommendations for Other Services       Precautions / Restrictions Precautions Precautions: Fall;Other (comment) Precaution Comments: watch HR Restrictions Weight Bearing Restrictions: Yes RLE Weight Bearing: Weight bearing as tolerated    Mobility  Bed Mobility Overal bed mobility: Needs Assistance Bed Mobility: Supine to Sit     Supine to sit: +2 for physical assistance;Max assist     General bed mobility comments: Increased abilty to progress to EOB, however still requires heavy assist to complete.  Transfers Overall transfer level: Needs assistance Equipment used: Rolling walker (2 wheeled);Ambulation equipment used Transfers: Sit to/from [facility] Sit to Stand: +2 physical assistance;Max assist Stand pivot transfers: +2 physical assistance;From elevated surface;Total assist       General transfer comment:  Attempted to stand with RW, however only able to get half way up despite cues; Able to complete 2 sit<>stand transfers from EOB and stedy seat; requires cues for hand placement and encouragement. Total A with stedy to transfer to chair.  Ambulation/Gait             General Gait Details: unable   Stairs             Wheelchair Mobility    Modified Rankin (Stroke Patients Only)       Balance Overall balance assessment: Needs assistance Sitting-balance support: Feet supported;Bilateral upper extremity supported Sitting balance-Leahy Scale: Fair Sitting balance - Comments: Supervision-min guard for safety, reliant on BUE support   Standing balance support: During functional activity Standing balance-Leahy Scale: Zero Standing balance comment: Reliant on stedy and plus 2 assist                            Cognition Arousal/Alertness: Awake/alert Behavior During Therapy: WFL for tasks assessed/performed Overall Cognitive Status: History of cognitive impairments - at baseline Area of Impairment: Memory                     Memory: Decreased short-term memory         General Comments: Pt very HOH, noted STM deficits. likely baseline. Pleasant.      Exercises General Exercises - Lower Extremity Ankle Circles/Pumps: Supine;AROM;Both;10 reps Quad Sets: AROM;Both;10 reps;Supine    General Comments General comments (skin integrity, edema, etc.): HR up to 153 bpm max, A-fib with activity. Daughter present during session.      Pertinent Vitals/Pain Pain Assessment: Faces  Faces Pain Scale: Hurts even more Pain Location: R hip with movement Pain Descriptors / Indicators: Grimacing;Operative site guarding;Guarding Pain Intervention(s): Monitored during session;Limited activity within patient's tolerance;Repositioned    Home Living                      Prior Function            PT Goals (current goals can now be found in the care plan  section) Acute Rehab PT Goals Patient Stated Goal: go home Progress towards PT goals: Progressing toward goals (slowly)    Frequency    Min 4X/week      PT Plan Current plan remains appropriate    Co-evaluation PT/OT/SLP Co-Evaluation/Treatment: Yes Reason for Co-Treatment: To address functional/ADL transfers;Complexity of the patient's impairments (multi-system involvement) PT goals addressed during session: Mobility/safety with mobility;Balance        AM-PAC PT "6 Clicks" Mobility   Outcome Measure  Help needed turning from your back to your side while in a flat bed without using bedrails?: A Lot Help needed moving from lying on your back to sitting on the side of a flat bed without using bedrails?: Total Help needed moving to and from a bed to a chair (including a wheelchair)?: Total Help needed standing up from a chair using your arms (e.g., wheelchair or bedside chair)?: Total Help needed to walk in hospital room?: Total Help needed climbing 3-5 steps with a railing? : Total 6 Click Score: 7    End of Session Equipment Utilized During Treatment: Gait belt Activity Tolerance: Patient limited by pain;Patient limited by fatigue Patient left: in chair;with call bell/phone within reach;with family/visitor present;with chair alarm set Nurse Communication: Mobility status;Need for lift equipment PT Visit Diagnosis: Other abnormalities of gait and mobility (R26.89);Muscle weakness (generalized) (M62.81);Pain Pain - Right/Left: Right Pain - part of body: Hip     Time: 9833-8250 PT Time Calculation (min) (ACUTE ONLY): 24 min  Charges:  $Therapeutic Activity: 8-22 mins                     Marisa Severin, PT, DPT Acute Rehabilitation Services Pager [phone] Office [place] 04/14/2020, 1:37 PM

## 2020-04-14 NOTE — Plan of Care (Signed)

## 2020-04-14 NOTE — Progress Notes (Signed)
   04/14/20 0748  Assess: MEWS Score  Temp 98.6 F (37 C)  BP 121/73  Pulse Rate (!) 140  Resp 16  Level of Consciousness Alert  SpO2 95 %  O2 Device Room Air  Assess: MEWS Score  MEWS Temp 0  MEWS Systolic 0  MEWS Pulse 3  MEWS RR 0  MEWS LOC 0  MEWS Score 3  MEWS Score Color Yellow  Assess: if the MEWS score is Yellow or Red  Were vital signs taken at a resting state? Yes  Focused Assessment Documented focused assessment  Early Detection of Sepsis Score *See Row Information* Low  MEWS guidelines implemented *See Row Information* No, previously yellow, continue vital signs every 4 hours  Treat  MEWS Interventions Administered prn meds/treatments  Escalate  MEWS: Escalate Yellow: discuss with charge nurse/RN and consider discussing with provider and RRT  Notify: Charge Nurse/RN  Name of Charge Nurse/RN Notified Sharyn Lull RN  Date Charge Nurse/RN Notified 04/14/20  Time Charge Nurse/RN Notified 0750  Notify: Provider  Provider Name/Title Dr. Candiss Norse  Date Provider Notified 04/14/20  Time Provider Notified [phone]  Notification Type Call  Notification Reason Other (Comment) (Pt's HR is elevated again)  Response See new orders  Date of Provider Response 04/14/20  Time of Provider Response 0746  Notify: Rapid Response  Name of Rapid Response RN Notified n/a  Document  Patient Outcome Other (Comment) (Continued Pt monitoring)   Charge RN notified, MD aware, gave PRN IV cardizem, Pt denies chest pain or SOB, daughter at bedside updated. Will continue to monitor.

## 2020-04-14 NOTE — Progress Notes (Signed)
PROGRESS NOTE        PATIENT DETAILS Name: [name] Age: 86 y.o. Sex: female Date of Birth: Jun 17, 1919 Admit Date: 04/09/2020 Admitting Physician Chauncey Mann, MD ZOX:[id number], Meindert, MD  Brief Narrative: Patient is a 86 y.o. female with history of HTN, hypothyroidism, PAD-who sustained a right hip fracture following a mechanical fall.  Significant events: 7/1>> admit to [facility] for mechanical fall-found to have right hip fracture  Significant studies: 7/1>> chest x-ray: Small left pleural effusion with left base atelectasis 7/1>> x-ray bilateral hips: Right femoral intertrochanteric fracture with varus angulation 7/1>> CT head: No acute intracranial abnormality 7/1>> CT C-spine: No acute bony abnormality in the C-spine.  Antimicrobial therapy: None  Microbiology data: None  Procedures : 7/2>> Treatment of intertrochanteric, pertrochanteric, subtrochanteric fracture with intramedullary implant  Consults: Orthopedics  DVT Prophylaxis : apixaban (ELIQUIS) tablet 2.5 mg Start: 04/13/20 1000 SCDs Start: 04/10/20 1505 SCDs Start: 04/09/20 2050  Subjective:  Patient in bed, appears comfortable, denies any headache, no fever, no chest pain or pressure, no shortness of breath , no abdominal pain. No focal weakness.   Assessment/Plan:  Right hip fracture: Following a mechanical fall-evaluated by orthopedics-underwent surgical repair on 7/2-spoke with Dr. Rolena Infante on 7/3-okay to start prophylactic Lovenox.  WBAT per orthopedics.  Evaluated by PT/OT-family contemplating taking patient home with home health-but want to see further improvement before finally deciding-otherwise will require SNF.  Anemia: Mild-likely secondary to perioperative blood loss-follow for now.  HTN: BP controlled-continue losartan/Norvasc  Hypothyroidism: Continue Synthroid  PAD: Stable-s/p PCI in LLE approximately 2017.  Spoke with Dr. Rolena Infante on 7/3-recommends resuming  Plavix in the next day or so.   Paroxysmal A. fib RVR with [place] vas 2 score of greater than 3.  TSH and echo nonacute, EF around 45 to 50%, placed on oral Cardizem along with as needed IV Lopressor and as needed IV Cardizem, 2 doses of digoxin on 04/14/2020 for better control.  Monitor on telemetry.  May require Cardizem drip.  Will monitor.  Started on low-dose Eliquis.  Outpatient cardiology follow-up if desired, not a candidate for aggressive treatment due to her age and frail status.  Right knee pain.  Acute on chronic.  Will x-ray with no acute changes.  Pain likely due to chronic arthritis.   Diet: Diet Order            Diet regular Room service appropriate? Yes; Fluid consistency: Thin  Diet effective now                  Code Status: Full code   Family Communication:  Daughter at bedside on 04/13/2020, 04/14/2020, risks and benefits of Eliquis explained and she accepts it.  She wants to take her mother home with home health.  Does not want SNF.  Disposition Plan: Status is: Inpatient  Remains inpatient appropriate because:Inpatient level of care appropriate due to severity of illness   Dispo: The patient is from: Home              Anticipated d/c is to: SNF vs HHPT              Anticipated d/c date is: > 1-2 days              Patient currently is not medically stable to d/c.   Barriers to Discharge: Right hip fracture-s/p operative/surgical repair  on 7/2-unsafe disposition-not sure if patient safe to go home or needs SNF.  Await further recommendations from rehab services  Antimicrobial agents: Anti-infectives (From admission, onward)   Start     Dose/Rate Route Frequency Ordered Stop   04/10/20 1515  ceFAZolin (ANCEF) IVPB 1 g/50 mL premix        1 g 100 mL/hr over 30 Minutes Intravenous Every 6 hours 04/10/20 1504 04/11/20 0423   04/10/20 0800  ceFAZolin (ANCEF) IVPB 2g/100 mL premix        2 g 200 mL/hr over 30 Minutes Intravenous To [facility] 04/09/20 2329  04/10/20 1257       Time spent: 15- minutes-Greater than 50% of this time was spent in counseling, explanation of diagnosis, planning of further management, and coordination of care.  MEDICATIONS: Scheduled Meds: . apixaban  2.5 mg Oral BID  . bisacodyl  10 mg Rectal Daily  . calcium-vitamin D  1 tablet Oral Q breakfast  . digoxin  0.125 mg Intravenous Q6H  . diltiazem  120 mg Oral Q8H  . docusate sodium  100 mg Oral BID  . insulin aspart  0-6 Units Subcutaneous Q4H  . levothyroxine  50 mcg Oral QAC breakfast  . polyethylene glycol  17 g Oral BID   Continuous Infusions:  PRN Meds:.acetaminophen, diltiazem, metoprolol tartrate, neomycin-bacitracin-polymyxin, [DISCONTINUED] ondansetron **OR** ondansetron (ZOFRAN) IV   PHYSICAL EXAM: Vital signs: Vitals:   04/14/20 0748 04/14/20 0800 04/14/20 1052 04/14/20 1101  BP: 121/73 102/64 103/65   Pulse: (!) 140 (!) 115 (!) 112 (!) 153  Resp: 16  15   Temp: 98.6 F (37 C)  98.5 F (36.9 C)   TempSrc: Oral  Oral   SpO2: 95%  97%   Weight:      Height:       Filed Weights   04/09/20 1649 04/09/20 1650 04/09/20 2000  Weight: 63.5 kg 65.8 kg 67.2 kg   Body mass index is 25.43 kg/m.   Gen Exam:  Awake, mildly confused, No new F.N deficits, Normal affect [place].AT,PERRAL Supple Neck,No JVD, No cervical lymphadenopathy appriciated.  Symmetrical Chest wall movement, Good air movement bilaterally, CTAB RRR,No Gallops, Rubs or new Murmurs, No Parasternal Heave +ve B.Sounds, Abd Soft, No tenderness, No organomegaly appriciated, No rebound - guarding or rigidity. No Cyanosis, right hip postop scar appears stable.  I have personally reviewed following labs and imaging studies  LABORATORY DATA: CBC: Recent Labs  Lab 04/09/20 1650 04/09/20 1650 04/10/20 0206 04/11/20 0254 04/12/20 0821 04/13/20 0859 04/14/20 0414  WBC 7.7   < > 10.0 10.3 9.3 10.1 10.1  NEUTROABS 5.2  --   --   --   --   --   --   HGB 13.0   < > 12.2 11.2*  10.7* 10.5* 10.6*  HCT 40.7   < > 38.0 35.5* 33.5* 33.9* 33.5*  MCV 96.7   < > 96.7 97.0 97.7 98.0 98.2  PLT 251   < > 252 248 243 301 355   < > = values in this interval not displayed.    Basic Metabolic Panel: Recent Labs  Lab 04/09/20 1650 04/09/20 1850 04/10/20 0206 04/11/20 0254 04/13/20 0859 04/14/20 0414  NA 140  --  138 136 140 137  K 3.5  --  4.0 4.2 4.6 4.8  CL 108  --  107 107 104 102  CO2 23  --  21* 21* 24 25  GLUCOSE 166*  --  202* 175* 228* 113*  BUN 13  --  16 25* 30* 34*  CREATININE 0.89  --  0.83 1.02* 1.10* 1.08*  CALCIUM 9.2  --  9.3 9.4 9.1 9.0  MG  --  1.9  --   --  1.9  --   PHOS  --  2.8  --   --   --   --     GFR: Estimated Creatinine Clearance: 26.8 mL/min (A) (by C-G formula based on SCr of 1.08 mg/dL (H)).  Liver Function Tests: Recent Labs  Lab 04/09/20 1650  AST 19  ALT 14  ALKPHOS 60  BILITOT 0.6  PROT 6.3*  ALBUMIN 3.5   No results for input(s): LIPASE, AMYLASE in the last 168 hours. No results for input(s): AMMONIA in the last 168 hours.  Coagulation Profile: Recent Labs  Lab 04/09/20 1650  INR 1.0    Cardiac Enzymes: No results for input(s): CKTOTAL, CKMB, CKMBINDEX, TROPONINI in the last 168 hours.  BNP (last 3 results) No results for input(s): PROBNP in the last 8760 hours.  Lipid Profile: No results for input(s): CHOL, HDL, LDLCALC, TRIG, CHOLHDL, LDLDIRECT in the last 72 hours.  Thyroid Function Tests: Recent Labs    04/13/20 0859  TSH 2.776    Anemia Panel: No results for input(s): VITAMINB12, FOLATE, FERRITIN, TIBC, IRON, RETICCTPCT in the last 72 hours.  Urine analysis:    Component Value Date/Time   COLORURINE YELLOW 04/09/2020 1844   APPEARANCEUR HAZY (A) 04/09/2020 1844   LABSPEC 1.013 04/09/2020 1844   PHURINE 6.0 04/09/2020 1844   GLUCOSEU NEGATIVE 04/09/2020 1844   HGBUR SMALL (A) 04/09/2020 1844   BILIRUBINUR NEGATIVE 04/09/2020 1844   KETONESUR 5 (A) 04/09/2020 1844   PROTEINUR NEGATIVE  04/09/2020 1844   NITRITE NEGATIVE 04/09/2020 1844   LEUKOCYTESUR MODERATE (A) 04/09/2020 1844    Sepsis Labs: Lactic Acid, Venous No results found for: LATICACIDVEN  MICROBIOLOGY: Recent Results (from the past 240 hour(s))  SARS Coronavirus 2 by RT PCR (hospital order, performed in [place] hospital lab) Nasopharyngeal     Status: None   Collection Time: 04/09/20  6:51 PM   Specimen: Nasopharyngeal  Result Value Ref Range Status   SARS Coronavirus 2 NEGATIVE NEGATIVE Final    Comment: (NOTE) SARS-CoV-2 target nucleic acids are NOT DETECTED.  The SARS-CoV-2 RNA is generally detectable in upper and lower respiratory specimens during the acute phase of infection. The lowest concentration of SARS-CoV-2 viral copies this assay can detect is 250 copies / mL. A negative result does not preclude SARS-CoV-2 infection and should not be used as the sole basis for treatment or other patient management decisions.  A negative result may occur with improper specimen collection / handling, submission of specimen other than nasopharyngeal swab, presence of viral mutation(s) within the areas targeted by this assay, and inadequate number of viral copies (<250 copies / mL). A negative result must be combined with clinical observations, patient history, and epidemiological information.  Fact Sheet for Patients:   StrictlyIdeas.no  Fact Sheet for Healthcare Providers: BankingDealers.co.za  This test is not yet approved or  cleared by the [place] FDA and has been authorized for detection and/or diagnosis of SARS-CoV-2 by FDA under an Emergency Use Authorization (EUA).  This EUA will remain in effect (meaning this test can be used) for the duration of the COVID-19 declaration under Section 564(b)(1) of the Act, 21 U.S.C. section 360bbb-3(b)(1), unless the authorization is terminated or revoked sooner.  Performed at [facility],  [address] [place], [place] [address]   Surgical PCR screen     Status: None   Collection Time: 04/09/20 11:24 PM   Specimen: Nasal Mucosa; Nasal Swab  Result Value Ref Range Status   MRSA, PCR NEGATIVE NEGATIVE Final   Staphylococcus aureus NEGATIVE NEGATIVE Final    Comment: (NOTE) The Xpert SA Assay (FDA approved for NASAL specimens in patients 42 years of age and older), is one component of a comprehensive surveillance program. It is not intended to diagnose infection nor to guide or monitor treatment. Performed at [facility], [place] [address], [place], [place] [address]     RADIOLOGY STUDIES/RESULTS: DG Chest Port 1 View  Result Date: 04/13/2020 CLINICAL DATA:  Shortness of breath, recent hip fracture surgery EXAM: PORTABLE CHEST 1 VIEW COMPARISON:  04/09/2020 FINDINGS: Similar small left effusion with left basilar atelectasis versus scarring. Stable cardiomegaly with vascular congestion. No new significant collapse or consolidation. No developing CHF pattern. Negative for pneumothorax. Aorta atherosclerotic. Bones are osteopenic and degenerative changes of the spine. IMPRESSION: Stable small left effusion with left basilar atelectasis Similar cardiomegaly with vascular congestion No significant interval change. Electronically Signed   By: Jerilynn Mages.  Shick M.D.   On: 04/13/2020 10:10   DG Knee Complete 4 Views Right  Result Date: 04/14/2020 CLINICAL DATA:  Recurrent knee pain.  Fell at home. EXAM: RIGHT KNEE - COMPLETE 4+ VIEW COMPARISON:  None. FINDINGS: Moderate tricompartmental degenerative changes with joint space narrowing, osteophytic spurring and mild bony eburnation. No acute fracture is identified. No osteochondral lesion. There is a moderate-sized ossified loose body noted in the posterior joint space. Small joint effusion. IMPRESSION: 1. Moderate tricompartmental degenerative changes and small joint effusion. 2. No acute bony findings. 3. Moderate-sized ossified loose body in  the posterior joint space. Electronically Signed   By: Marijo Sanes M.D.   On: 04/14/2020 10:56   ECHOCARDIOGRAM COMPLETE  Result Date: 04/13/2020    ECHOCARDIOGRAM REPORT   Patient Name:   MARSI TURVEY Date of Exam: 04/13/2020 Medical Rec #:  [id number]       Height:       64.0 in Accession #:    [id number]      Weight:       148.1 lb Date of Birth:  01-Feb-1919       BSA:          1.722 m Patient Age:    74 years        BP:           120/60 mmHg Patient Gender: F               HR:           157 bpm. Exam Location:  Inpatient Procedure: 2D Echo, Cardiac Doppler, Color Doppler and Intracardiac            Opacification Agent Indications:    CHF-Acute Diastolic 657.84 / O96.29  History:        Patient has no prior history of Echocardiogram examinations.                 Risk Factors:Non-Smoker. PAD.  Sonographer:    Vickie Epley RDCS Referring Phys: [place]  1. Left ventricular ejection fraction, by estimation, is 40 to 45% - difficult to evaluate in the setting of rapid heart rate (possibly atrial fibrillation or atypical atrial flutter). The left ventricle has mildly decreased function. The left ventricle  demonstrates global hypokinesis. There  is mild left ventricular hypertrophy. Left ventricular diastolic parameters are indeterminate.  2. Right ventricular systolic function is normal. The right ventricular size is normal. There is normal pulmonary artery systolic pressure. The estimated right ventricular systolic pressure is 99.3 mmHg.  3. Left atrial size was upper normal.  4. The mitral valve is abnormal, mildly thickened and calcified with restricted posterior leaflet. Trivial mitral valve regurgitation. There is an intermittently seen echodensity possibly in association with the posterior leaflet. This could be a flail subleaflet, but cannot exclude vegetation if there is any concern for endocarditis.  5. The aortic valve is tricuspid. Aortic valve regurgitation is not visualized.  Mild to moderate aortic valve sclerosis/calcification is present, without any evidence of aortic stenosis.  6. The inferior vena cava is normal in size with greater than 50% respiratory variability, suggesting right atrial pressure of 3 mmHg. FINDINGS  Left Ventricle: Left ventricular ejection fraction, by estimation, is 40 to 45%. The left ventricle has mildly decreased function. The left ventricle demonstrates global hypokinesis. Definity contrast agent was given IV to delineate the left ventricular  endocardial borders. The left ventricular internal cavity size was small. There is mild left ventricular hypertrophy. Left ventricular diastolic parameters are indeterminate. Right Ventricle: The right ventricular size is normal. No increase in right ventricular wall thickness. Right ventricular systolic function is normal. There is normal pulmonary artery systolic pressure. The tricuspid regurgitant velocity is 2.13 m/s, and  with an assumed right atrial pressure of 3 mmHg, the estimated right ventricular systolic pressure is 57.0 mmHg. Left Atrium: Left atrial size was upper normal. Right Atrium: Right atrial size was normal in size. Pericardium: There is no evidence of pericardial effusion. Mitral Valve: The mitral valve is abnormal. There is mild calcification of the mitral valve leaflet(s). Mild to moderate mitral annular calcification. Trivial mitral valve regurgitation. Tricuspid Valve: The tricuspid valve is grossly normal. Tricuspid valve regurgitation is mild. Aortic Valve: The aortic valve is tricuspid. Aortic valve regurgitation is not visualized. Mild to moderate aortic valve sclerosis/calcification is present, without any evidence of aortic stenosis. Mild aortic valve annular calcification. Pulmonic Valve: The pulmonic valve was grossly normal. Pulmonic valve regurgitation is trivial. Aorta: The aortic root is normal in size and structure. Venous: The inferior vena cava is normal in size with greater  than 50% respiratory variability, suggesting right atrial pressure of 3 mmHg. IAS/Shunts: No atrial level shunt detected by color flow Doppler.  LEFT VENTRICLE PLAX 2D LVIDd:         3.40 cm LVIDs:         3.00 cm LV PW:         1.30 cm LV IVS:        1.30 cm LVOT diam:     1.60 cm LV SV:         25 LV SV Index:   14 LVOT Area:     2.01 cm  LV Volumes (MOD) LV vol d, MOD A2C: 62.9 ml LV vol d, MOD A4C: 56.1 ml LV vol s, MOD A2C: 38.9 ml LV vol s, MOD A4C: 39.6 ml LV SV MOD A2C:     24.0 ml LV SV MOD A4C:     56.1 ml LV SV MOD BP:      20.9 ml RIGHT VENTRICLE RV S prime:     9.90 cm/s TAPSE (M-mode): 1.3 cm LEFT ATRIUM             Index       RIGHT  ATRIUM           Index LA diam:        3.70 cm 2.15 cm/m  RA Area:     12.60 cm LA Vol (A2C):   52.1 ml 30.25 ml/m RA Volume:   26.50 ml  15.39 ml/m LA Vol (A4C):   50.7 ml 29.44 ml/m LA Biplane Vol: 55.6 ml 32.29 ml/m  AORTIC VALVE LVOT Vmax:   88.80 cm/s LVOT Vmean:  63.500 cm/s LVOT VTI:    0.122 m  AORTA Ao Root diam: 3.00 cm TRICUSPID VALVE TR Peak grad:   18.1 mmHg TR Vmax:        213.00 cm/s  SHUNTS Systemic VTI:  0.12 m Systemic Diam: 1.60 cm Rozann Lesches MD Electronically signed by Rozann Lesches MD Signature Date/Time: 04/13/2020/4:04:09 PM    Final      LOS: 5 days   Signature  Lala Lund M.D on 04/14/2020 at 11:45 AM   -  To page go to [email or web address]

## 2020-04-14 NOTE — Progress Notes (Signed)
   04/14/20 1704  Assess: MEWS Score  Temp 99.4 F (37.4 C)  BP 126/73  Pulse Rate (!) 118  Resp 15  Level of Consciousness Alert  SpO2 97 %  O2 Device Room Air  Assess: MEWS Score  MEWS Temp 0  MEWS Systolic 0  MEWS Pulse 2  MEWS RR 0  MEWS LOC 0  MEWS Score 2  MEWS Score Color Yellow  Assess: if the MEWS score is Yellow or Red  Were vital signs taken at a resting state? Yes  Focused Assessment Documented focused assessment  Early Detection of Sepsis Score *See Row Information* Low  MEWS guidelines implemented *See Row Information* Yes  Treat  MEWS Interventions Administered prn meds/treatments  Escalate  MEWS: Escalate Yellow: discuss with charge nurse/RN and consider discussing with provider and RRT  Notify: Charge Nurse/RN  Name of Charge Nurse/RN Notified [place], RN  Date Charge Nurse/RN Notified 04/14/20  Time Charge Nurse/RN Notified 1705  Notify: Provider  Provider Name/Title Dr. Candiss Norse  Date Provider Notified 04/14/20  Time Provider Notified 1705  Notification Type  (secure chat)  Notification Reason Other (Comment) (elevated HR)  Response No new orders;Other (Comment) (PRN med given)  Date of Provider Response 04/14/20  Time of Provider Response 1706  Notify: Rapid Response  Name of Rapid Response RN Notified n/a  Document  Patient Outcome Other (Comment) (Continued to be monitored)  Progress note created (see row info) Yes    Charge RN notified, MD updated, PRN med given, daughter at bedside updated. Pt denies chest pain, resting in bed, will continue to monitor.

## 2020-04-14 NOTE — Progress Notes (Signed)
Occupational Therapy Treatment Patient Details Name: [name] MRN: [id number] DOB: May 02, 1919 Today's Date: 04/14/2020    History of present illness Pt is a 86 y.o. F with significant PMH of breast cancer and hypertension who sustained a right hip fracture following a fail s/p IMN 04/10/2020.   OT comments  Patient continues to make progress towards goals in skilled OT session. Patient's session encompassed co-treat with PT due to previous sessions requiring max to total A of 2 to progress towards EOB. Pt with increased participation to complete bed mobility (mod-max of 2) and completed basic grooming with set up however requires max encouragement to participate (refused to comb hair). Pt attempted to complete sit<>stand with RW, however was total A, then completed 2 sit<>stands with stedy (max A trial 1, then total A with minimal engagement in trial 2). Family is still wanting to take pt home, and is understanding of what that will entail; will continue to follow acutely.   Follow Up Recommendations  Home health OT;Supervision/Assistance - 24 hour;SNF;Other (comment) (family still wanting to take pt home)    Equipment Recommendations  3 in 1 bedside commode;Hospital bed;Other (comment) (Hoyer lift with lift pads)    Recommendations for Other Services      Precautions / Restrictions Precautions Precautions: Fall Precaution Comments: watch HR Restrictions Weight Bearing Restrictions: Yes RLE Weight Bearing: Weight bearing as tolerated       Mobility Bed Mobility Overal bed mobility: Needs Assistance Bed Mobility: Supine to Sit     Supine to sit: +2 for physical assistance;Max assist     General bed mobility comments: Increased abilty to progress to EOB, however still requires heavy assist to complete  Transfers Overall transfer level: Needs assistance Equipment used: Rolling walker (2 wheeled);Ambulation equipment used   Sit to Stand: Total assist;+2 physical  assistance         General transfer comment: Attempted to stand with RW, however unable to complete, able to complete 2 sit<>stand transfers (max to total of 2 depending on participation) continues to require cues for hand placement and encouragement to engage    Balance Overall balance assessment: Needs assistance Sitting-balance support: Feet supported;Bilateral upper extremity supported Sitting balance-Leahy Scale: Fair     Standing balance support: Bilateral upper extremity supported;During functional activity Standing balance-Leahy Scale: Zero Standing balance comment: Reliant on stedy and plus 2 assist                           ADL either performed or assessed with clinical judgement   ADL Overall ADL's : Needs assistance/impaired     Grooming: Wash/dry hands;Set up;Maximal assistance;Wash/dry face;Brushing hair Grooming Details (indicate cue type and reason): Pt able to wash face, however unable/unwilling to attempt to comb hair requiring assist                 Toilet Transfer: Maximal assistance;+2 for physical assistance;+2 for safety/equipment;Total assistance;Cueing for safety;Cueing for sequencing Toilet Transfer Details (indicate cue type and reason): Simulated with stedy         Functional mobility during ADLs: Maximal assistance;+2 for physical assistance;+2 for safety/equipment;Total assistance General ADL Comments: Pt remains limited by pain, unable to stand with rolling walked and max A +2, but able to complete 2 sit<>stands with max-total A of 2 to complete     Vision       Perception     Praxis      Cognition Arousal/Alertness: Awake/alert Behavior During Therapy: [facility]  for tasks assessed/performed Overall Cognitive Status: Within Functional Limits for tasks assessed                                 General Comments: Pt very HOH, noted STM deficits. likely baseline        Exercises     Shoulder Instructions        General Comments      Pertinent Vitals/ Pain       Pain Assessment: Faces Faces Pain Scale: Hurts even more Pain Location: R hip with movement Pain Descriptors / Indicators: Grimacing;Operative site guarding Pain Intervention(s): Limited activity within patient's tolerance;Monitored during session;Repositioned  Home Living                                          Prior Functioning/Environment              Frequency  Min 2X/week        Progress Toward Goals  OT Goals(current goals can now be found in the care plan section)  Progress towards OT goals: Progressing toward goals  Acute Rehab OT Goals Patient Stated Goal: go home OT Goal Formulation: With patient Time For Goal Achievement: 04/25/20 Potential to Achieve Goals: Good  Plan Discharge plan remains appropriate    Co-evaluation                 AM-PAC OT "6 Clicks" Daily Activity     Outcome Measure   Help from another person eating meals?: A Little Help from another person taking care of personal grooming?: A Little Help from another person toileting, which includes using toliet, bedpan, or urinal?: A Lot Help from another person bathing (including washing, rinsing, drying)?: Total Help from another person to put on and taking off regular upper body clothing?: A Little Help from another person to put on and taking off regular lower body clothing?: Total 6 Click Score: 13    End of Session Equipment Utilized During Treatment: Gait belt;Rolling walker;Other (comment) Charlaine Dalton)  OT Visit Diagnosis: Unsteadiness on feet (R26.81);Other abnormalities of gait and mobility (R26.89);Muscle weakness (generalized) (M62.81);History of falling (Z91.81);Pain Pain - Right/Left: Right Pain - part of body: Hip;Leg   Activity Tolerance Patient tolerated treatment well   Patient Left in chair;with call bell/phone within reach;with chair alarm set;with family/visitor present   Nurse  Communication Mobility status;Precautions;Weight bearing status        Time: 6168-3729 OT Time Calculation (min): 24 min  Charges: OT General Charges $OT Visit: 1 Visit OT Treatments $Self Care/Home Management : 8-22 mins  Corinne Ports [address]. [place], [place] [facility] [place] 04/14/2020, 11:13 AM

## 2020-04-15 ENCOUNTER — Encounter: Payer: Self-pay | Admitting: Physician Assistant

## 2020-04-15 LAB — BASIC METABOLIC PANEL
Anion gap: 9 (ref 5–15)
BUN: 34 mg/dL — ABNORMAL HIGH (ref 8–23)
CO2: 24 mmol/L (ref 22–32)
Calcium: 8.9 mg/dL (ref 8.9–10.3)
Chloride: 104 mmol/L (ref 98–111)
Creatinine, Ser: 1.1 mg/dL — ABNORMAL HIGH (ref 0.44–1.00)
GFR calc Af Amer: 48 mL/min — ABNORMAL LOW (ref >60)
GFR calc non Af Amer: 41 mL/min — ABNORMAL LOW (ref >60)
Glucose, Bld: 158 mg/dL — ABNORMAL HIGH (ref 70–99)
Potassium: 5 mmol/L (ref 3.5–5.1)
Sodium: 137 mmol/L (ref 135–145)

## 2020-04-15 LAB — CBC WITH DIFFERENTIAL/PLATELET
Abs Immature Granulocytes: 0.11 10*3/uL — ABNORMAL HIGH (ref 0.00–0.07)
Basophils Absolute: 0 10*3/uL (ref 0.0–0.1)
Basophils Relative: 0 %
Eosinophils Absolute: 0.2 10*3/uL (ref 0.0–0.5)
Eosinophils Relative: 2 %
HCT: 33.1 % — ABNORMAL LOW (ref 36.0–46.0)
Hemoglobin: 10.7 g/dL — ABNORMAL LOW (ref 12.0–15.0)
Immature Granulocytes: 1 %
Lymphocytes Relative: 17 %
Lymphs Abs: 1.8 10*3/uL (ref 0.7–4.0)
MCH: 31.4 pg (ref 26.0–34.0)
MCHC: 32.3 g/dL (ref 30.0–36.0)
MCV: 97.1 fL (ref 80.0–100.0)
Monocytes Absolute: 1 10*3/uL (ref 0.1–1.0)
Monocytes Relative: 10 %
Neutro Abs: 7.3 10*3/uL (ref 1.7–7.7)
Neutrophils Relative %: 70 %
Platelets: 361 10*3/uL (ref 150–400)
RBC: 3.41 MIL/uL — ABNORMAL LOW (ref 3.87–5.11)
RDW: 13.4 % (ref 11.5–15.5)
WBC: 10.4 10*3/uL (ref 4.0–10.5)
nRBC: 0 % (ref 0.0–0.2)

## 2020-04-15 LAB — GLUCOSE, CAPILLARY
Glucose-Capillary: 148 mg/dL — ABNORMAL HIGH (ref 70–99)
Glucose-Capillary: 154 mg/dL — ABNORMAL HIGH (ref 70–99)
Glucose-Capillary: 159 mg/dL — ABNORMAL HIGH (ref 70–99)
Glucose-Capillary: 205 mg/dL — ABNORMAL HIGH (ref 70–99)

## 2020-04-15 LAB — MAGNESIUM: Magnesium: 2.4 mg/dL (ref 1.7–2.4)

## 2020-04-15 MED ORDER — LOPERAMIDE HCL 2 MG PO CAPS
2.0000 mg | ORAL_CAPSULE | Freq: Four times a day (QID) | ORAL | Status: DC | PRN
  Administered 2020-04-15: 2 mg via ORAL
  Filled 2020-04-15: qty 1

## 2020-04-15 MED ORDER — LOPERAMIDE HCL 2 MG PO CAPS: 2.0000 mg | ORAL_CAPSULE | Freq: Four times a day (QID) | ORAL | 0 refills | Status: AC | PRN

## 2020-04-15 MED ORDER — DILTIAZEM HCL ER COATED BEADS 360 MG PO CP24
360.0000 mg | ORAL_CAPSULE | Freq: Every day | ORAL | 0 refills | Status: DC
Start: 2020-04-15 — End: 2020-09-10

## 2020-04-15 MED ORDER — APIXABAN 2.5 MG PO TABS: 2.5000 mg | ORAL_TABLET | Freq: Two times a day (BID) | ORAL | 0 refills | Status: DC

## 2020-04-15 MED ORDER — METOPROLOL TARTRATE 25 MG PO TABS
25.0000 mg | ORAL_TABLET | Freq: Two times a day (BID) | ORAL | Status: DC
  Administered 2020-04-15: 25 mg via ORAL
  Filled 2020-04-15: qty 1

## 2020-04-15 MED ORDER — DILTIAZEM HCL 30 MG PO TABS
120.0000 mg | ORAL_TABLET | Freq: Once | ORAL | Status: AC
  Administered 2020-04-15: 120 mg via ORAL

## 2020-04-15 MED ORDER — SODIUM POLYSTYRENE SULFONATE 15 GM/60ML PO SUSP
15.0000 g | Freq: Once | ORAL | Status: AC
  Administered 2020-04-15: 15 g via ORAL
  Filled 2020-04-15: qty 60

## 2020-04-15 NOTE — Discharge Summary (Addendum)
[name] LID:[id number] DOB: 1918/11/01 DOA: 04/09/2020  PCP: Lorelee Market, MD  Admit date: 04/09/2020  Discharge date: 04/15/2020  Admitted From: Home   Disposition:  Home   Recommendations for Outpatient Follow-up:   Follow up with PCP in 1-2 weeks  PCP Please obtain BMP/CBC, 2 view CXR in 1week,  (see Discharge instructions)   PCP Please follow up on the following pending results:    Home Health: PT, RN   Equipment/Devices: Hospital bed, rolling walker and three in one Consultations: Ortho Discharge Condition: Stable    CODE STATUS: DNR    Diet Recommendation: Heart Healthy     Chief Complaint  Patient presents with  . Fall     Brief history of present illness from the day of admission and additional interim summary     Brief Narrative: Patient is a 86 y.o. female with history of HTN, hypothyroidism, PAD-who sustained a right hip fracture following a mechanical fall.  Significant events: 7/1>> admit to [facility] for mechanical fall-found to have right hip fracture  Significant studies: 7/1>> chest x-ray: Small left pleural effusion with left base atelectasis 7/1>> x-ray bilateral hips: Right femoral intertrochanteric fracture with varus angulation 7/1>> CT head: No acute intracranial abnormality 7/1>> CT C-spine: No acute bony abnormality in the C-spine.  Antimicrobial therapy: None  Microbiology data: None  Procedures : 7/2>>Treatment of intertrochanteric, pertrochanteric, subtrochanteric fracture with intramedullary implant  Consults: [facility]    Right hip fracture: Following a mechanical fall-evaluated by orthopedics-underwent surgical repair on 7/2-now on Eliquis for A. fib and DVT  prophylaxis.  WBAT per orthopedics.  Evaluated by PT/OT, she qualified for SNF but family chose to take her home. Goal of care is gentle medical treatment directed towards comfort. If any significant decline consider hospice at home.  Anemia: Mild-likely secondary to perioperative blood loss-follow for now. Overall stable now. No transfusions needed. Minimal drop.  HTN: BP controlled-continue Cardizem.  Hypothyroidism: Continue Synthroid  PAD: Stable-s/p PCI in LLE approximately 2017.   Now on Eliquis hence Plavix held.  Paroxysmal A. fib RVR with [place] vas 2 score of greater than 3.  TSH and echo nonacute, EF around 45 to 50%, placed on oral Cardizem along with low-dose Eliquis.  Outpatient cardiology follow-up if desired, not a candidate for aggressive treatment due to her age and frail status. Goal will be rate control.  Right knee pain.  Acute on chronic. Stable x-ray. Supportive care.   Discharge diagnosis     Principal Problem:   Intertrochanteric fracture [facility]) Active Problems:   Hypothyroidism   PAD (peripheral artery disease) ([place])   Essential hypertension   Fall at home, initial  encounter    Discharge instructions      Discharge Medications   Allergies as of 04/15/2020      Reactions   Codeine Nausea And Vomiting      Medication List    STOP taking these medications   amLODipine 5 MG tablet Commonly known as: NORVASC   clopidogrel 75 MG tablet Commonly known as: PLAVIX   losartan 50 MG tablet Commonly known as: COZAAR     TAKE these medications   acetaminophen 500 MG tablet Commonly known as: TYLENOL Take 1,000 mg by mouth at bedtime.   apixaban 2.5 MG Tabs tablet Commonly known as: ELIQUIS Take 1 tablet (2.5 mg total) by mouth 2 (two) times daily.   Cranberry 125 MG Tabs Take 1 tablet by mouth daily.   diltiazem 360 MG 24 hr capsule Commonly known as: Cardizem CD Take 1 capsule (360 mg total) by mouth daily.   levothyroxine 50 MCG  tablet Commonly known as: SYNTHROID Take 50 mcg by mouth daily before breakfast.   loperamide 2 MG capsule Commonly known as: IMODIUM Take 1 capsule (2 mg total) by mouth every 6 (six) hours as needed for diarrhea or loose stools.   PreserVision AREDS Tabs Take 1 tablet by mouth daily.   vitamin B-12 500 MCG tablet Commonly known as: CYANOCOBALAMIN Take 500 mcg by mouth every other day.            Durable Medical Equipment  (From admission, onward)         Start     Ordered   04/13/20 0839  For home use only DME 3 n 1  Once        04/13/20 0839   04/13/20 0729  For home use only DME Hospital bed  Once       Question Answer Comment  Length of Need 6 Months   Patient has (list medical condition): Old, frail, acute hip fracture   The above medical condition requires: Patient requires the ability to reposition frequently   Bed type Semi-electric   [employer] Yes   [place] Yes   Support Surface: Gel Overlay      04/13/20 0729   04/13/20 0729  For home use only DME 4 wheeled rolling walker with seat  Once       Question:  Patient needs a walker to treat with the following condition  Answer:  Hip fracture ([place])   04/13/20 0729           Follow-up Information    Nicholes Stairs, MD Follow up in 1 week(s).   Specialty: Orthopedic Surgery Why: For suture removal, For wound re-check Contact information: [address] [place] [address] [phone]        Lorelee Market, MD. Schedule an appointment as soon as possible for a visit in 1 week(s).   Specialty: Family Medicine Contact information: [place] [address] [phone]               Major procedures and Radiology Reports - PLEASE review detailed and final reports thoroughly  -        CT Head Wo Contrast  Result Date: 04/09/2020 CLINICAL DATA:  Fall, on anticoagulation. EXAM: CT HEAD WITHOUT CONTRAST CT CERVICAL SPINE WITHOUT CONTRAST TECHNIQUE:  Multidetector CT imaging of the head and cervical spine was performed following the standard protocol without intravenous contrast. Multiplanar CT image reconstructions of the cervical spine were also generated. COMPARISON:  None. FINDINGS: CT HEAD  FINDINGS Brain: There is atrophy and chronic small vessel disease changes. Old right basal ganglia lacunar infarcts. No acute intracranial abnormality. Specifically, no hemorrhage, hydrocephalus, mass lesion, acute infarction, or significant intracranial injury. Vascular: No hyperdense vessel or unexpected calcification. Skull: No acute calvarial abnormality. Sinuses/Orbits: Visualized paranasal sinuses and mastoids clear. Orbital soft tissues unremarkable. Other: None CT CERVICAL SPINE FINDINGS Alignment: Normal Skull base and vertebrae: No acute fracture. No primary bone lesion or focal pathologic process. Soft tissues and spinal canal: No prevertebral fluid or swelling. No visible canal hematoma. Disc levels: Diffuse advanced degenerative disc disease and facet disease. Upper chest: No acute findings Other: None IMPRESSION: Old right basal ganglia lacunar infarcts. Atrophy, chronic microvascular disease. No acute intracranial abnormality. Degenerative disc and facet disease. No acute bony abnormality in the cervical spine. Electronically Signed   By: Rolm Baptise M.D.   On: 04/09/2020 17:40   CT Cervical Spine Wo Contrast  Result Date: 04/09/2020 CLINICAL DATA:  Fall, on anticoagulation. EXAM: CT HEAD WITHOUT CONTRAST CT CERVICAL SPINE WITHOUT CONTRAST TECHNIQUE: Multidetector CT imaging of the head and cervical spine was performed following the standard protocol without intravenous contrast. Multiplanar CT image reconstructions of the cervical spine were also generated. COMPARISON:  None. FINDINGS: CT HEAD FINDINGS Brain: There is atrophy and chronic small vessel disease changes. Old right basal ganglia lacunar infarcts. No acute intracranial abnormality.  Specifically, no hemorrhage, hydrocephalus, mass lesion, acute infarction, or significant intracranial injury. Vascular: No hyperdense vessel or unexpected calcification. Skull: No acute calvarial abnormality. Sinuses/Orbits: Visualized paranasal sinuses and mastoids clear. Orbital soft tissues unremarkable. Other: None CT CERVICAL SPINE FINDINGS Alignment: Normal Skull base and vertebrae: No acute fracture. No primary bone lesion or focal pathologic process. Soft tissues and spinal canal: No prevertebral fluid or swelling. No visible canal hematoma. Disc levels: Diffuse advanced degenerative disc disease and facet disease. Upper chest: No acute findings Other: None IMPRESSION: Old right basal ganglia lacunar infarcts. Atrophy, chronic microvascular disease. No acute intracranial abnormality. Degenerative disc and facet disease. No acute bony abnormality in the cervical spine. Electronically Signed   By: Rolm Baptise M.D.   On: 04/09/2020 17:40   DG Chest Port 1 View  Result Date: 04/13/2020 CLINICAL DATA:  Shortness of breath, recent hip fracture surgery EXAM: PORTABLE CHEST 1 VIEW COMPARISON:  04/09/2020 FINDINGS: Similar small left effusion with left basilar atelectasis versus scarring. Stable cardiomegaly with vascular congestion. No new significant collapse or consolidation. No developing CHF pattern. Negative for pneumothorax. Aorta atherosclerotic. Bones are osteopenic and degenerative changes of the spine. IMPRESSION: Stable small left effusion with left basilar atelectasis Similar cardiomegaly with vascular congestion No significant interval change. Electronically Signed   By: Jerilynn Mages.  Shick M.D.   On: 04/13/2020 10:10   DG Chest Portable 1 View  Result Date: 04/09/2020 CLINICAL DATA:  Fall EXAM: PORTABLE CHEST 1 VIEW COMPARISON:  06/28/2014 FINDINGS: Cardiomegaly. Aortic atherosclerosis. Small left pleural effusion suspected with left base atelectasis. No overt edema. Right lung clear. No pneumothorax or  acute bony abnormality. IMPRESSION: Small left pleural effusion with left base atelectasis. Cardiomegaly, aortic atherosclerosis. Electronically Signed   By: Rolm Baptise M.D.   On: 04/09/2020 17:26   DG Knee Complete 4 Views Right  Result Date: 04/14/2020 CLINICAL DATA:  Recurrent knee pain.  Fell at home. EXAM: RIGHT KNEE - COMPLETE 4+ VIEW COMPARISON:  None. FINDINGS: Moderate tricompartmental degenerative changes with joint space narrowing, osteophytic spurring and mild bony eburnation. No acute fracture is identified. No osteochondral lesion. There  is a moderate-sized ossified loose body noted in the posterior joint space. Small joint effusion. IMPRESSION: 1. Moderate tricompartmental degenerative changes and small joint effusion. 2. No acute bony findings. 3. Moderate-sized ossified loose body in the posterior joint space. Electronically Signed   By: Marijo Sanes M.D.   On: 04/14/2020 10:56   DG C-Arm 1-60 Min  Result Date: 04/10/2020 CLINICAL DATA:  Internal fixation EXAM: OPERATIVE RIGHT HIP (WITH PELVIS IF PERFORMED) 6 VIEWS TECHNIQUE: Fluoroscopic spot image(s) were submitted for interpretation post-operatively. COMPARISON:  04/09/2020 FINDINGS: Internal fixation across the right femoral intertrochanteric fracture. Near anatomic alignment. No hardware bony complicating feature. IMPRESSION: Internal fixation.  No visible complicating feature. Electronically Signed   By: Rolm Baptise M.D.   On: 04/10/2020 18:51   ECHOCARDIOGRAM COMPLETE  Result Date: 04/13/2020    ECHOCARDIOGRAM REPORT   Patient Name:   RILEI KRAVITZ Date of Exam: 04/13/2020 Medical Rec #:  [id number]       Height:       64.0 in Accession #:    [id number]      Weight:       148.1 lb Date of Birth:  07/10/1919       BSA:          1.722 m Patient Age:    56 years        BP:           120/60 mmHg Patient Gender: F               HR:           157 bpm. Exam Location:  Inpatient Procedure: 2D Echo, Cardiac Doppler, Color Doppler and  Intracardiac            Opacification Agent Indications:    CHF-Acute Diastolic 301.60 / F09.32  History:        Patient has no prior history of Echocardiogram examinations.                 Risk Factors:Non-Smoker. PAD.  Sonographer:    Vickie Epley RDCS Referring Phys: [place]  1. Left ventricular ejection fraction, by estimation, is 40 to 45% - difficult to evaluate in the setting of rapid heart rate (possibly atrial fibrillation or atypical atrial flutter). The left ventricle has mildly decreased function. The left ventricle  demonstrates global hypokinesis. There is mild left ventricular hypertrophy. Left ventricular diastolic parameters are indeterminate.  2. Right ventricular systolic function is normal. The right ventricular size is normal. There is normal pulmonary artery systolic pressure. The estimated right ventricular systolic pressure is 35.5 mmHg.  3. Left atrial size was upper normal.  4. The mitral valve is abnormal, mildly thickened and calcified with restricted posterior leaflet. Trivial mitral valve regurgitation. There is an intermittently seen echodensity possibly in association with the posterior leaflet. This could be a flail subleaflet, but cannot exclude vegetation if there is any concern for endocarditis.  5. The aortic valve is tricuspid. Aortic valve regurgitation is not visualized. Mild to moderate aortic valve sclerosis/calcification is present, without any evidence of aortic stenosis.  6. The inferior vena cava is normal in size with greater than 50% respiratory variability, suggesting right atrial pressure of 3 mmHg. FINDINGS  Left Ventricle: Left ventricular ejection fraction, by estimation, is 40 to 45%. The left ventricle has mildly decreased function. The left ventricle demonstrates global hypokinesis. Definity contrast agent was given IV to delineate the left ventricular  endocardial borders. The left ventricular internal  cavity size was small. There is  mild left ventricular hypertrophy. Left ventricular diastolic parameters are indeterminate. Right Ventricle: The right ventricular size is normal. No increase in right ventricular wall thickness. Right ventricular systolic function is normal. There is normal pulmonary artery systolic pressure. The tricuspid regurgitant velocity is 2.13 m/s, and  with an assumed right atrial pressure of 3 mmHg, the estimated right ventricular systolic pressure is 93.2 mmHg. Left Atrium: Left atrial size was upper normal. Right Atrium: Right atrial size was normal in size. Pericardium: There is no evidence of pericardial effusion. Mitral Valve: The mitral valve is abnormal. There is mild calcification of the mitral valve leaflet(s). Mild to moderate mitral annular calcification. Trivial mitral valve regurgitation. Tricuspid Valve: The tricuspid valve is grossly normal. Tricuspid valve regurgitation is mild. Aortic Valve: The aortic valve is tricuspid. Aortic valve regurgitation is not visualized. Mild to moderate aortic valve sclerosis/calcification is present, without any evidence of aortic stenosis. Mild aortic valve annular calcification. Pulmonic Valve: The pulmonic valve was grossly normal. Pulmonic valve regurgitation is trivial. Aorta: The aortic root is normal in size and structure. Venous: The inferior vena cava is normal in size with greater than 50% respiratory variability, suggesting right atrial pressure of 3 mmHg. IAS/Shunts: No atrial level shunt detected by color flow Doppler.  LEFT VENTRICLE PLAX 2D LVIDd:         3.40 cm LVIDs:         3.00 cm LV PW:         1.30 cm LV IVS:        1.30 cm LVOT diam:     1.60 cm LV SV:         25 LV SV Index:   14 LVOT Area:     2.01 cm  LV Volumes (MOD) LV vol d, MOD A2C: 62.9 ml LV vol d, MOD A4C: 56.1 ml LV vol s, MOD A2C: 38.9 ml LV vol s, MOD A4C: 39.6 ml LV SV MOD A2C:     24.0 ml LV SV MOD A4C:     56.1 ml LV SV MOD BP:      20.9 ml RIGHT VENTRICLE RV S prime:     9.90 cm/s  TAPSE (M-mode): 1.3 cm LEFT ATRIUM             Index       RIGHT ATRIUM           Index LA diam:        3.70 cm 2.15 cm/m  RA Area:     12.60 cm LA Vol (A2C):   52.1 ml 30.25 ml/m RA Volume:   26.50 ml  15.39 ml/m LA Vol (A4C):   50.7 ml 29.44 ml/m LA Biplane Vol: 55.6 ml 32.29 ml/m  AORTIC VALVE LVOT Vmax:   88.80 cm/s LVOT Vmean:  63.500 cm/s LVOT VTI:    0.122 m  AORTA Ao Root diam: 3.00 cm TRICUSPID VALVE TR Peak grad:   18.1 mmHg TR Vmax:        213.00 cm/s  SHUNTS Systemic VTI:  0.12 m Systemic Diam: 1.60 cm Rozann Lesches MD Electronically signed by Rozann Lesches MD Signature Date/Time: 04/13/2020/4:04:09 PM    Final    DG HIP OPERATIVE UNILAT W OR W/O PELVIS RIGHT  Result Date: 04/10/2020 CLINICAL DATA:  Internal fixation EXAM: OPERATIVE RIGHT HIP (WITH PELVIS IF PERFORMED) 6 VIEWS TECHNIQUE: Fluoroscopic spot image(s) were submitted for interpretation post-operatively. COMPARISON:  04/09/2020 FINDINGS: Internal fixation across the right  femoral intertrochanteric fracture. Near anatomic alignment. No hardware bony complicating feature. IMPRESSION: Internal fixation.  No visible complicating feature. Electronically Signed   By: Rolm Baptise M.D.   On: 04/10/2020 18:51   DG Hips Bilat W or Wo Pelvis 3-4 Views  Result Date: 04/09/2020 CLINICAL DATA:  Fall EXAM: DG HIP (WITH OR WITHOUT PELVIS) 3-4V BILAT COMPARISON:  None. FINDINGS: There is a right femoral intertrochanteric fracture with mild varus angulation. No subluxation or dislocation. SI joints symmetric. IMPRESSION: Right femoral intertrochanteric fracture with varus angulation. Electronically Signed   By: Rolm Baptise M.D.   On: 04/09/2020 17:27    Micro Results     Recent Results (from the past 240 hour(s))  SARS Coronavirus 2 by RT PCR (hospital order, performed in [facility] hospital lab) Nasopharyngeal     Status: None   Collection Time: 04/09/20  6:51 PM   Specimen: Nasopharyngeal  Result Value Ref Range Status   SARS  Coronavirus 2 NEGATIVE NEGATIVE Final    Comment: (NOTE) SARS-CoV-2 target nucleic acids are NOT DETECTED.  The SARS-CoV-2 RNA is generally detectable in upper and lower respiratory specimens during the acute phase of infection. The lowest concentration of SARS-CoV-2 viral copies this assay can detect is 250 copies / mL. A negative result does not preclude SARS-CoV-2 infection and should not be used as the sole basis for treatment or other patient management decisions.  A negative result may occur with improper specimen collection / handling, submission of specimen other than nasopharyngeal swab, presence of viral mutation(s) within the areas targeted by this assay, and inadequate number of viral copies (<250 copies / mL). A negative result must be combined with clinical observations, patient history, and epidemiological information.  Fact Sheet for Patients:   StrictlyIdeas.no  Fact Sheet for Healthcare Providers: BankingDealers.co.za  This test is not yet approved or  cleared by the [place] FDA and has been authorized for detection and/or diagnosis of SARS-CoV-2 by FDA under an Emergency Use Authorization (EUA).  This EUA will remain in effect (meaning this test can be used) for the duration of the COVID-19 declaration under Section 564(b)(1) of the Act, 21 U.S.C. section 360bbb-3(b)(1), unless the authorization is terminated or revoked sooner.  Performed at [facility], [place] [address] [place], [place] [address]   Surgical PCR screen     Status: None   Collection Time: 04/09/20 11:24 PM   Specimen: Nasal Mucosa; Nasal Swab  Result Value Ref Range Status   MRSA, PCR NEGATIVE NEGATIVE Final   Staphylococcus aureus NEGATIVE NEGATIVE Final    Comment: (NOTE) The Xpert SA Assay (FDA approved for NASAL specimens in patients 9 years of age and older), is one component of a comprehensive surveillance program. It is not  intended to diagnose infection nor to guide or monitor treatment. Performed at [facility], [place] [address] [place], [place] [address]     Today   Subjective    [name] today has no headache,no chest abdominal pain,no new weakness tingling or numbness, feels much better     Objective   Blood pressure (!) 112/55, pulse 78, temperature 98.2 F (36.8 C), temperature source Oral, resp. rate 16, height 5\' 4"  (1.626 m), weight 67.2 kg, SpO2 96 %.   Intake/Output Summary (Last 24 hours) at 04/15/2020 1023 Last data filed at 04/15/2020 0500 Gross per 24 hour  Intake --  Output 800 ml  Net -800 ml    Exam  Awake, mildly confused, No  new F.N deficits, Normal affect Violet.AT,PERRAL Supple Neck,No JVD, No cervical lymphadenopathy appriciated.  Symmetrical Chest wall movement, Good air movement bilaterally, CTAB iRRR,No Gallops,Rubs or new Murmurs, No Parasternal Heave +ve B.Sounds, Abd Soft, Non tender, No organomegaly appriciated, No rebound -guarding or rigidity. No Cyanosis, right hip postop scar site appears stable   Data Review   CBC w Diff:  Lab Results  Component Value Date   WBC 10.4 04/15/2020   HGB 10.7 (L) 04/15/2020   HCT 33.1 (L) 04/15/2020   PLT 361 04/15/2020   LYMPHOPCT 17 04/15/2020   MONOPCT 10 04/15/2020   EOSPCT 2 04/15/2020   BASOPCT 0 04/15/2020    CMP:  Lab Results  Component Value Date   NA 137 04/15/2020   K 5.0 04/15/2020   CL 104 04/15/2020   CO2 24 04/15/2020   BUN 34 (H) 04/15/2020   CREATININE 1.10 (H) 04/15/2020   PROT 6.3 (L) 04/09/2020   ALBUMIN 3.5 04/09/2020   BILITOT 0.6 04/09/2020   ALKPHOS 60 04/09/2020   AST 19 04/09/2020   ALT 14 04/09/2020  . Lab Results  Component Value Date   TSH 2.776 04/13/2020     Total Time in preparing paper work, data evaluation and todays exam - 14 minutes  Lala Lund M.D on 04/15/2020 at 10:23 AM  [facility]   Office  [phone]

## 2020-04-15 NOTE — TOC CAGE-AID Note (Signed)
Transition of Care [facility]) - CAGE-AID Screening   Patient Details  Name: [name] MRN: [id number] Date of Birth: 09-Aug-1919  Transition of Care [facility]) CM/SW Contact:    Emeterio Reeve, [place] Phone Number: 04/15/2020, 11:02 AM   Clinical Narrative:  Pt was unable to participate in assessment due to only being oriented to person and place.    CAGE-AID Screening: Substance Abuse Screening unable to be completed due to: : Patient unable to participate               [place] [place] Clinical Social Worker [phone]

## 2020-04-15 NOTE — TOC Transition Note (Signed)
Transition of Care [facility]) - CM/SW Discharge Note   Patient Details  Name: Glendon Fiser MRN: [id number] Date of Birth: Jan 10, 1919  Transition of Care [facility]) CM/SW Contact:  Sharin Mons, RN Phone Number: [phone] 04/15/2020, 12:41 PM   Clinical Narrative:    Late entry 04/15/2020 @ 54 Pt will transition to home with daughter Arbie Cookey. DME : rolling walker and BSC delivered to pt's bedside prior to d/c. Per [facility] hospital bed already delivered to pt home.  NCM provided daughter Arbie Cookey with Eliquis 30 day free card. [place] services with [facility] to begin within 48 hrs. Transportation to home arranged with [facility] by NCM.  Pt without Rx med concerns or affordability. Hospital f/u noted on .AVS.    Final next level of care: [place] Barriers to Discharge: No Barriers Identified   Patient Goals and CMS Choice     Choice offered to / list presented to : Adult Children  Discharge Placement                       Discharge Plan and Services   Discharge Planning Services: CM Consult            DME Arranged: 3-N-1, Walker rolling, Hospital bed DME Agency: [employer] Date DME Agency Contacted: 04/13/20 Time DME Agency Contacted: [phone] Representative spoke with at DME Agency: Freda Munro HH Arranged: PT, OT [place] Agency: [facility] (formerly [employer]) Date [place]: 04/13/20 Time [place]: [place] Representative spoke with at [place]: [place] ([place]) Interventions     Readmission Risk Interventions No flowsheet data found.

## 2020-04-15 NOTE — Plan of Care (Signed)
  Problem: Education: Goal: Knowledge of General Education information will improve Description: Including pain rating scale, medication(s)/side effects and non-pharmacologic comfort measures Outcome: Adequate for Discharge   Problem: Health Behavior/Discharge Planning: Goal: Ability to manage health-related needs will improve Outcome: Adequate for Discharge   Problem: Clinical Measurements: Goal: Will remain free from infection Outcome: Adequate for Discharge   Problem: Coping: Goal: Level of anxiety will decrease Outcome: Adequate for Discharge   Problem: Elimination: Goal: Will not experience complications related to bowel motility Outcome: Adequate for Discharge   Problem: Pain Managment: Goal: General experience of comfort will improve Outcome: Adequate for Discharge   Problem: Safety: Goal: Ability to remain free from injury will improve Outcome: Adequate for Discharge

## 2020-04-15 NOTE — Discharge Instructions (Addendum)
Follow with Primary MD Lorelee Market, MD in 7 days   Activity: As tolerated with Full fall precautions use walker/cane & assistance as needed  Disposition Home   Diet: Soft with feeding assistance and aspiration precautions.  Special Instructions: If you have smoked or chewed Tobacco  in the last 2 yrs please stop smoking, stop any regular Alcohol  and or any Recreational drug use.  On your next visit with your primary care physician please Get Medicines reviewed and adjusted.  Please request your Prim.MD to go over all Hospital Tests and Procedure/Radiological results at the follow up, please get all Hospital records sent to your Prim MD by signing hospital release before you go home.  If you experience worsening of your admission symptoms, develop shortness of breath, life threatening emergency, suicidal or homicidal thoughts you must seek medical attention immediately by calling 911 or calling your MD immediately  if symptoms less severe.  You Must read complete instructions/literature along with all the possible adverse reactions/side effects for all the Medicines you take and that have been prescribed to you. Take any new Medicines after you have completely understood and accpet all the possible adverse reactions/side effects.     -Maintain postoperative bandages until your follow-up appointment.  If these become saturated or soiled you may change for a clean, dry dressing.  -You may shower with your bandages in place.  Please do not submerge underwater.  -Okay for full weightbearing as tolerated to the right lower extremity.  -For mild to moderate pain use Tylenol and ice around-the-clock.  For breakthrough pain use tramadol as necessary.   Information on my medicine - ELIQUIS (apixaban)  This medication education was reviewed with me or my healthcare representative as part of my discharge preparation.    Why was Eliquis prescribed for you? Eliquis was prescribed for  you to reduce the risk of a blood clot forming that can cause a stroke if you have a medical condition called atrial fibrillation (a type of irregular heartbeat).  What do You need to know about Eliquis ? Take your Eliquis TWICE DAILY - one tablet in the morning and one tablet in the evening with or without food. If you have difficulty swallowing the tablet whole please discuss with your pharmacist how to take the medication safely.  Take Eliquis exactly as prescribed by your doctor and DO NOT stop taking Eliquis without talking to the doctor who prescribed the medication.  Stopping may increase your risk of developing a stroke.  Refill your prescription before you run out.  After discharge, you should have regular check-up appointments with your healthcare provider that is prescribing your Eliquis.  In the future your dose may need to be changed if your kidney function or weight changes by a significant amount or as you get older.  What do you do if you miss a dose? If you miss a dose, take it as soon as you remember on the same day and resume taking twice daily.  Do not take more than one dose of ELIQUIS at the same time to make up a missed dose.  Important Safety Information A possible side effect of Eliquis is bleeding. You should call your healthcare provider right away if you experience any of the following: ? Bleeding from an injury or your nose that does not stop. ? Unusual colored urine (red or dark brown) or unusual colored stools (red or black). ? Unusual bruising for unknown reasons. ? A serious fall or if you  hit your head (even if there is no bleeding).  Some medicines may interact with Eliquis and might increase your risk of bleeding or clotting while on Eliquis. To help avoid this, consult your healthcare provider or pharmacist prior to using any new prescription or non-prescription medications, including herbals, vitamins, non-steroidal anti-inflammatory drugs (NSAIDs) and  supplements.  This website has more information on Eliquis (apixaban): http://www.eliquis.com/eliquis/home      Information on my medicine - ELIQUIS (apixaban)  This medication education was reviewed with me or my healthcare representative as part of my discharge preparation.    Why was Eliquis prescribed for you? Eliquis was prescribed for you to reduce the risk of a blood clot forming that can cause a stroke if you have a medical condition called atrial fibrillation (a type of irregular heartbeat).  What do You need to know about Eliquis ? Take your Eliquis TWICE DAILY - one tablet in the morning and one tablet in the evening with or without food. If you have difficulty swallowing the tablet whole please discuss with your pharmacist how to take the medication safely.  Take Eliquis exactly as prescribed by your doctor and DO NOT stop taking Eliquis without talking to the doctor who prescribed the medication.  Stopping may increase your risk of developing a stroke.  Refill your prescription before you run out.  After discharge, you should have regular check-up appointments with your healthcare provider that is prescribing your Eliquis.  In the future your dose may need to be changed if your kidney function or weight changes by a significant amount or as you get older.  What do you do if you miss a dose? If you miss a dose, take it as soon as you remember on the same day and resume taking twice daily.  Do not take more than one dose of ELIQUIS at the same time to make up a missed dose.  Important Safety Information A possible side effect of Eliquis is bleeding. You should call your healthcare provider right away if you experience any of the following: ? Bleeding from an injury or your nose that does not stop. ? Unusual colored urine (red or dark brown) or unusual colored stools (red or black). ? Unusual bruising for unknown reasons. ? A serious fall or if you hit your head  (even if there is no bleeding).  Some medicines may interact with Eliquis and might increase your risk of bleeding or clotting while on Eliquis. To help avoid this, consult your healthcare provider or pharmacist prior to using any new prescription or non-prescription medications, including herbals, vitamins, non-steroidal anti-inflammatory drugs (NSAIDs) and supplements.  This website has more information on Eliquis (apixaban): http://www.eliquis.com/eliquis/home

## 2020-04-15 NOTE — Care Management Important Message (Signed)
Important Message  Patient Details  Name: [name] MRN: [id number] Date of Birth: May 15, 1919   Medicare Important Message Given:  Yes     Brianda Beitler 04/15/2020, 1:18 PM

## 2020-04-21 ENCOUNTER — Telehealth: Payer: Self-pay | Admitting: Cardiovascular Disease

## 2020-04-21 NOTE — Telephone Encounter (Signed)
Patient daughter [name] calling  Would like to know if they should continue Eliquis medication Needing to schedule an appointment but wants to do Virtual with Dr Fletcher Anon only Offered next available 8/19 - wants to know if this is too far Please call to discuss

## 2020-04-21 NOTE — Telephone Encounter (Signed)
DPR on file. Spoke with the patients daughter [name].  [name] sts that the patient was d/c home on Eliquis 2.5 mg bid. The patient has been taking the medication as prescribed. [name] sts that the patient has had no signs of bleeding, no falls or injuries. They have been checking the patients VS regularly. No readings were provided, but [name] reports them being in normal range, no irregularity in the patients pulse. [name] would like Dr. Tyrell Antonio recommendation regardng the continuation of Eliquis and when the cardiology f/u is needed.  Jerilynn Mages that I will fwd the message to Dr. Fletcher Anon and call back with his response and recommendation.

## 2020-04-23 NOTE — Telephone Encounter (Signed)
I think it is a good idea to continue Eliquis for now in place of Plavix.  She should follow-up with me within 1 month.

## 2020-04-24 NOTE — Telephone Encounter (Signed)
Spoke with the patient daughter [name] and made her aware of Dr. Tyrell Antonio response and recommendation. [name] sts that the patient is having mobility and transportation issues and will need a virtual appt. [name] sts that they have talked to Dr. Fletcher Anon about the patient having a virtual appt and he is ok with the that.  [name] sts that she should be the one called to schedule the appt.

## 2020-05-28 ENCOUNTER — Encounter: Payer: Self-pay | Admitting: Cardiovascular Disease

## 2020-05-28 ENCOUNTER — Telehealth (INDEPENDENT_AMBULATORY_CARE_PROVIDER_SITE_OTHER): Payer: Medicare PPO | Admitting: Cardiovascular Disease

## 2020-05-28 ENCOUNTER — Other Ambulatory Visit: Payer: Self-pay

## 2020-05-28 VITALS — BP 132/60 | HR 86 | Ht 62.0 in | Wt 145.0 lb

## 2020-05-28 DIAGNOSIS — I4819 Other persistent atrial fibrillation: Secondary | ICD-10-CM | POA: Diagnosis not present

## 2020-05-28 DIAGNOSIS — I10 Essential (primary) hypertension: Secondary | ICD-10-CM | POA: Diagnosis not present

## 2020-05-28 DIAGNOSIS — I739 Peripheral vascular disease, unspecified: Secondary | ICD-10-CM

## 2020-05-28 DIAGNOSIS — E785 Hyperlipidemia, unspecified: Secondary | ICD-10-CM

## 2020-05-28 NOTE — Patient Instructions (Signed)
Medication Instructions:  Continue same medications *If you need a refill on your cardiac medications before your next appointment, please call your pharmacy*   Lab Work: None If you have labs (blood work) drawn today and your tests are completely normal, you will receive your results only by: Marland Kitchen MyChart Message (if you have MyChart) OR . A paper copy in the mail If you have any lab test that is abnormal or we need to change your treatment, we will call you to review the results.   Testing/Procedures: None   Follow-Up: At [facility], you and your health needs are our priority.  As part of our continuing mission to provide you with exceptional heart care, we have created designated Provider Care Teams.  These Care Teams include your primary Cardiologist (physician) and Advanced Practice Providers (APPs -  Physician Assistants and Nurse Practitioners) who all work together to provide you with the care you need, when you need it.  We recommend signing up for the patient portal called "MyChart".  Sign up information is provided on this After Visit Summary.  MyChart is used to connect with patients for Virtual Visits (Telemedicine).  Patients are able to view lab/test results, encounter notes, upcoming appointments, etc.  Non-urgent messages can be sent to your provider as well.   To learn more about what you can do with MyChart, go to [email or web address].    Your next appointment:   3 month(s)  The format for your next appointment:   In Person  Provider:    You may see [name], MD or one of the following Advanced Practice Providers on your designated Care Team:    Murray Hodgkins, NP  Christell Faith, PA-C  Marrianne Mood, PA-C    Other Instructions

## 2020-05-28 NOTE — Progress Notes (Signed)
Virtual Visit via Telephone Note   This visit type was conducted due to national recommendations for restrictions regarding the COVID-19 Pandemic (e.g. social distancing) in an effort to limit this patient's exposure and mitigate transmission in our community.  Due to her co-morbid illnesses, this patient is at least at moderate risk for complications without adequate follow up.  This format is felt to be most appropriate for this patient at this time.  The patient did not have access to video technology/had technical difficulties with video requiring transitioning to audio format only (telephone).  All issues noted in this document were discussed and addressed.  No physical exam could be performed with this format.  Please refer to the patient's chart for her  consent to telehealth for [facility].    Date:  05/28/2020   ID:  [name], DOB Mar 08, 1919, MRN [id number] The patient was identified using 2 identifiers.  Patient Location: Home Provider Location: Home Office  PCP:  Lorelee Market, MD  Cardiologist:  [name], MD  Electrophysiologist:  None   Evaluation Performed:  Follow-Up Visit  Chief Complaint:  Follow up visit  History of Present Illness:    [name] is a 86 y.o. female who was reached via phone for follow-up visit regarding peripheral arterial disease and paroxysmal atrial fibrillation. The patient has no previous cardiac history and no history of diabetes or tobacco use. She is known to have hypertension, hyperlipidemia and hypothyroidism.   She was seen in 2017 for nonhealing wounds on the left foot.  Noninvasive vascular evaluation showed an ABI of 0.55 on the right and 0.40 on the left. Duplex showed relatively long occlusion of the mid to distal left SFA with two-vessel runoff below the knee. Angiography at that time showed no significant aortoiliac disease. There was long occlusion of the left SFA with 1 vessel runoff below the knee via the  peroneal artery which  gave collaterals to the dorsalis pedis. I performed successful angioplasty of the left SFA followed by drug-coated balloon angioplasty and spot stenting with self-expanding stent in the midsegment.  She was seen in 2018 for  nonhealing ulceration on the right foot with severe rest pain.  She has a  She underwent a vascular studies which showed severely reduced ABI on the right 0.4 with evidence of high-grade stenosis in the right distal SFA or occlusion. I proceeded with angiography in February 2019 which showed moderate calcified disease involving the proximal and mid SFA followed by short occlusion distally with moderate disease in the popliteal artery and one-vessel runoff below the knee via the peroneal artery with severe disease involving the TP trunk and proximal peroneal artery.  I performed successful orbital atherectomy and drug-coated balloon angioplasty to the right SFA, TP trunk and proximal peroneal artery. Postprocedure ABI improved to 0.79 on the right.  However, there was persistent to severely elevated velocity in the mid SFA at 546. She did have a generalized rash one day after the procedure which was thought to be due to delayed contrast reaction.   She was diagnosed with atrial fibrillation in 2019. She was not anticoagulated given the need for dual antiplatelet therapy. She was hospitalized in July after a fall which resulted in right hip fracture. She underwent surgical repair. She was placed on Eliquis for DVT prophylaxis is also given history of A. Fib.  She was discharged home with home PT and has been doing well overall.  No chest pain, shortness of breath or palpitations.  No  lower extremity ulceration.  The patient does not have symptoms concerning for COVID-19 infection (fever, chills, cough, or new shortness of breath).    Past Medical History:  Diagnosis Date  . Arthritis    "legs" (11/22/2017)  . Breast cancer, right breast ([place])    mastectomy  30 yr ago  . Cancer [facility])    Breast Cancer  . DVT (deep venous thrombosis) ([place])    "found an old clot in her ?left leg when they were doing vascular studies; put her on ASA" (05/18/2016)  . Family history of adverse reaction to anesthesia    "all the family get PONV" (05/18/2016)  . High cholesterol   . Hypertension   . Hypothyroidism   . kidney calculi   . Macular degeneration   . PAD (peripheral artery disease) ([place])   . Pneumonia 1990s  . PONV (postoperative nausea and vomiting)   . Shortness of breath    Past Surgical History:  Procedure Laterality Date  . ABDOMINAL AORTOGRAM W/LOWER EXTREMITY N/A 11/22/2017   Procedure: ABDOMINAL AORTOGRAM W/LOWER EXTREMITY;  Surgeon: Wellington Hampshire, MD;  Location: [place] [facility];  Service: Cardiovascular;  Laterality: N/A;  . BALLOON ANGIOPLASTY, ARTERY Left 05/18/2016   SFA/notes 05/18/2016  . BREAST BIOPSY Right   . CATARACT EXTRACTION W/ INTRAOCULAR LENS  IMPLANT, BILATERAL Bilateral 2014  . CYSTOSCOPY W/ LITHOLAPAXY / [facility]    . CYSTOSCOPY W/ URETERAL STENT PLACEMENT  01/17/2012   Procedure: CYSTOSCOPY WITH RETROGRADE PYELOGRAM/URETERAL STENT PLACEMENT;  Surgeon: Franchot Gallo, MD;  Location: [facility];  Service: Urology;  Laterality: Left;  . CYSTOSCOPY W/ URETERAL STENT PLACEMENT  11/10/2010   Archie Endo 11/10/2010  . CYSTOSCOPY W/ URETERAL STENT REMOVAL  11/22/2010   Archie Endo 11/22/2010  . FEMORAL ENDARTERECTOMY    . FRACTURE SURGERY    . INTRAMEDULLARY (IM) NAIL INTERTROCHANTERIC Right 04/10/2020   Procedure: INTRAMEDULLARY (IM) NAIL INTERTROCHANTRIC;  Surgeon: Nicholes Stairs, MD;  Location: [place];  Service: Orthopedics;  Laterality: Right;  . MASTECTOMY Right 1989  . MASTECTOMY COMPLETE / SIMPLE Right   . PERIPHERAL ARTERIAL STENT GRAFT Left 2017  . PERIPHERAL VASCULAR ATHERECTOMY Right 11/22/2017   SFA/notes 11/22/2017  . PERIPHERAL VASCULAR ATHERECTOMY Right 11/22/2017   Procedure: PERIPHERAL VASCULAR ATHERECTOMY;  Surgeon: Wellington Hampshire, MD;  Location: [place] [facility];  Service: Cardiovascular;  Laterality: Right;  . PERIPHERAL VASCULAR CATHETERIZATION N/A 05/18/2016   Procedure: Abdominal Aortogram w/Lower Extremity;  Surgeon: Wellington Hampshire, MD;  Location: [place] [facility];  Service: Cardiovascular;  Laterality: N/A;  . WRIST FRACTURE SURGERY Left      Current Meds  Medication Sig  . acetaminophen (TYLENOL) 500 MG tablet Take 1,000 mg by mouth every 6 (six) hours as needed for moderate pain or headache.  Marland Kitchen apixaban (ELIQUIS) 2.5 MG TABS tablet Take 1 tablet (2.5 mg total) by mouth 2 (two) times daily.  . cetirizine (ZYRTEC) 10 MG tablet Take 10 mg by mouth daily as needed for allergies.   . Cranberry 125 MG TABS Take 1 tablet by mouth daily.  Marland Kitchen diltiazem (CARDIZEM CD) 360 MG 24 hr capsule Take 1 capsule (360 mg total) by mouth daily.  Marland Kitchen docusate sodium (COLACE) 100 MG capsule Take 100 mg by mouth daily as needed for mild constipation.  . fluconazole (DIFLUCAN) 100 MG tablet as needed.  Marland Kitchen levothyroxine (SYNTHROID) 50 MCG tablet Take 50 mcg by mouth daily before breakfast.   . loperamide (IMODIUM) 2 MG capsule Take 1 capsule (2  mg total) by mouth every 6 (six) hours as needed for diarrhea or loose stools.  . Multiple Vitamins-Minerals (PRESERVISION AREDS) TABS Take 1 tablet by mouth daily.  Vladimir Faster Glycol-Propyl Glycol (SYSTANE OP) Place 1 drop into both eyes daily as needed (for dry eyes).  . vitamin B-12 (CYANOCOBALAMIN) 500 MCG tablet Take 500 mcg by mouth every other day.  . [DISCONTINUED] Cyanocobalamin (VITAMIN B-12 PO) Take 1 tablet by mouth every other day.     Allergies:   Codeine, Codeine, and Contrast media [iodinated diagnostic agents]   Social History   Tobacco Use  . Smoking status: Never Smoker  . Smokeless tobacco: Never Used  Substance Use Topics  . Alcohol use: Never  . Drug use: Never     Family Hx: The patient's family history includes CVA in her brother and sister;  Diabetes in her mother.  ROS:   Please see the history of present illness.     All other systems reviewed and are negative.   Prior CV studies:   The following studies were reviewed today:    Labs/Other Tests and Data Reviewed:    EKG:  No ECG reviewed.  Recent Labs: 04/09/2020: ALT 14 04/13/2020: B Natriuretic Peptide 624.6; TSH 2.776 04/15/2020: BUN 34; Creatinine, Ser 1.10; Hemoglobin 10.7; Magnesium 2.4; Platelets 361; Potassium 5.0; Sodium 137   Recent Lipid Panel No results found for: CHOL, TRIG, HDL, CHOLHDL, LDLCALC, LDLDIRECT  Wt Readings from Last 3 Encounters:  05/28/20 145 lb (65.8 kg)  04/09/20 148 lb 2.4 oz (67.2 kg)  10/25/19 145 lb (65.8 kg)     Objective:    Vital Signs:  BP 132/60   Pulse 86   Ht 5\' 2"  (1.575 m)   Wt 145 lb (65.8 kg)   SpO2 96%   BMI 26.52 kg/m    VITAL SIGNS:  reviewed  ASSESSMENT & PLAN:    1. Peripheral arterial disease: Status post bilateral lower extremity revascularization for critical limb ischemia and nonhealing ulceration.  Currently with no wounds or claudication.  Most recent vascular studies in October showed a drop in left ABI to 0.51.  As long as she stays asymptomatic, no plans for further invasive work-up at this point especially with her age.  No need to repeat vascular studies for the same reason unless she is symptomatic. 2. Persistent atrial fibrillation: Ventricular rate is controlled with diltiazem.  She is on anticoagulation with low-dose Eliquis 2.5 mg twice daily.  Her weight is above 60 and creatinine is below 1.5.  Thus, 5 mg dosing twice daily is appropriate dose for her but given recent anemia and relatively frail status, I will keep her on this dose for now and reevaluate in 3 months when she comes for follow-up.  She will need repeat labs at that time to ensure improvement in hemoglobin. 3. Hyperlipidemia: It does not seem that she is taking atorvastatin anymore. 4. Essential hypertension: Blood pressure is  controlled.    Time:   Today, I have spent 8 minutes with the patient with telehealth technology discussing the above problems.     Medication Adjustments/Labs and Tests Ordered: Current medicines are reviewed at length with the patient today.  Concerns regarding medicines are outlined above.   Tests Ordered: No orders of the defined types were placed in this encounter.   Medication Changes: No orders of the defined types were placed in this encounter.   Follow Up:  In Person in 3 month(s)  Signed, [name], MD  05/28/2020  10:08 AM    [place]

## 2020-06-17 ENCOUNTER — Telehealth: Payer: Self-pay | Admitting: Cardiovascular Disease

## 2020-06-17 DIAGNOSIS — R0602 Shortness of breath: Secondary | ICD-10-CM

## 2020-06-17 NOTE — Telephone Encounter (Signed)
Spoke with Merry Proud, RN with [facility]. Verbal order given to for bmet, bnp, and cbc.Merry Proud will draw the bmet and cbc today. He will need to go back out to the patients home wither tomorrow of Friday to draw the bnp. Results will be faxed to our office. Fax # provided.

## 2020-06-17 NOTE — Telephone Encounter (Signed)
Lets check CBC, basic metabolic profile and BNP and this will help [place] determine if we should increase the dose of Eliquis or not.  I also need to know her most recent weight.

## 2020-06-17 NOTE — Telephone Encounter (Signed)
Noted; will await lab results.

## 2020-06-17 NOTE — Telephone Encounter (Signed)
Patients daughter calling in post virtual visit with complaints SOB and heavy breathing. Patient has began therapy after her femur injury and is now moving around causing some breathing issues. Patients daughter states their was a conversation regarding her eliquis and is unsure if he may want to increase it from the 2.5 mg bid  Please advise

## 2020-06-17 NOTE — Telephone Encounter (Addendum)
  Spoke with the patients daughter [name]. [name] sts that the patient has been having some sob with exertion. The patient is currently enrolled in at home PT and has some sob with movement. Ex. Ambulating from her wheelchair to the commode.  Patient denies chest pain, PND, orthopnea, no signs of swelling.  The patient does have PAF, a Lithium comes out to the patients home a couple of times a week and monitors her VS. [name] sts that the patients BP is good, her O2 90-93%. Her HR is normally in the 60's. Occasional will get up to the 100's if she is in AFIB (this is not sustained).   [name] sts that there was discussion with Dr. Fletcher Anon regarding increasing the patients Eliquis to 5mg  bid. She would like to know if Dr. Fletcher Anon would like to do that now. [name] sts that the patient has no visible signs of bleeding.   Update after the initial message. Patients son sts that the patient is having some sob while sitting.  Jerilynn Mages that I will fwd the message to Dr. Fletcher Anon and call back with his recommendation.

## 2020-06-17 NOTE — Telephone Encounter (Signed)
Daughter calling back with weight and vital Weight - 143 lb BP 138/70 Temp 97.9  O2 97.5 HR-68 Lungs sounded clear by nurse

## 2020-06-17 NOTE — Telephone Encounter (Signed)
Spoke with the patient daughter [name] adv her of Dr. Tyrell Antonio response and recommendation.  [name] sts that the patients [facility] nurse Merry Proud with [facility] is scheduled to come out to the patients home today and may be able to draw the needed labwork.  Called and and left a detailed message on [facility] cell [phone].if jeff is unable to draw labs [name] will call back to provide an update. Lab orders will need to be placed for the labs to be [facility] (bmet,bnp,cbc)

## 2020-06-19 ENCOUNTER — Encounter (INDEPENDENT_AMBULATORY_CARE_PROVIDER_SITE_OTHER): Payer: Medicare PPO | Admitting: Ophthalmology

## 2020-06-22 NOTE — Telephone Encounter (Signed)
Patient daughter calling in for lab results for patient.

## 2020-06-23 NOTE — Telephone Encounter (Addendum)
Spoke with the patients daughter [name]. Adv her that we are still awaiting the lab results from the labs drawn by the patients [facility] nurse. Park Meo that once received and reviewed by Dr. Fletcher Anon we will call her with his recommendation.  [name] verbalized understanding and voiced appreciation for the call.

## 2020-06-26 ENCOUNTER — Encounter (INDEPENDENT_AMBULATORY_CARE_PROVIDER_SITE_OTHER): Payer: Medicare PPO | Admitting: Ophthalmology

## 2020-06-26 ENCOUNTER — Encounter: Payer: Self-pay | Admitting: Cardiovascular Disease

## 2020-06-26 ENCOUNTER — Other Ambulatory Visit: Payer: Self-pay

## 2020-06-26 DIAGNOSIS — H353231 Exudative age-related macular degeneration, bilateral, with active choroidal neovascularization: Secondary | ICD-10-CM

## 2020-06-26 DIAGNOSIS — I10 Essential (primary) hypertension: Secondary | ICD-10-CM | POA: Diagnosis not present

## 2020-06-26 DIAGNOSIS — H35033 Hypertensive retinopathy, bilateral: Secondary | ICD-10-CM

## 2020-06-26 DIAGNOSIS — H43813 Vitreous degeneration, bilateral: Secondary | ICD-10-CM

## 2020-06-29 MED ORDER — FUROSEMIDE 20 MG PO TABS: 20.0000 mg | ORAL_TABLET | Freq: Every day | ORAL | 1 refills | Status: DC | PRN

## 2020-06-29 MED ORDER — APIXABAN 5 MG PO TABS: 5.0000 mg | ORAL_TABLET | Freq: Two times a day (BID) | ORAL | 2 refills | Status: DC

## 2020-06-29 NOTE — Telephone Encounter (Addendum)
DPR on file.  Patient daughter [name] made aware of Dr. Tyrell Antonio instruction below.  Increase Eliquis to 5mg  bid. An Rx has been sent to the patients pharmacy.  Patients UTI has been addressed by her urologist and she is currently on antibiotics.  [name] sts that the patient has not complained of being sob in several days. She is reluctant to start the patient on a diuretic due to the patient already being weak and having poor food and fluid intake.  They would like to have the Rx for lasix on hand if needed. Park Meo that I will send in the Rx. They are to contact the office to provide an update if it is going to be given to the patient since she will require lab work (bmet).  [name] verbalized understanding and voiced appreciation for the assistance.       Wellington Hampshire, MD  Parris[place]God[place], Izell [place], RN I reviewed her labs which showed normal CBC and renal function. We can increase Eliquis to 5 mg twice daily. Somebody checked her urinalysis and it was abnormal with evidence of infection. I am assuming that was addressed by her primary care physician.

## 2020-06-29 NOTE — Telephone Encounter (Signed)
-----   Message from Wellington Hampshire, MD sent at 06/29/2020 10:07 AM EDT ----- Mildly elevated BNP.  If she is having shortness of breath, recommend adding furosemide 20 mg daily and repeat basic metabolic profile in 1 week after.

## 2020-07-01 MED ORDER — FUROSEMIDE 20 MG PO TABS: 20.0000 mg | ORAL_TABLET | Freq: Every day | ORAL | 1 refills | Status: DC

## 2020-07-01 NOTE — Telephone Encounter (Signed)
Ok

## 2020-07-01 NOTE — Telephone Encounter (Signed)
Patient daughter [name] calling  Would like advice on what to do about fluid pill States they had a test done to see if she had kidney stones that showed she had fluid in lungs and in abdomen Please call to discuss

## 2020-07-01 NOTE — Telephone Encounter (Signed)
Spoke with the patients daughter [name]. [name] sts that the patient was seen by her urologist and had a scan of her bladder. They were told that there is some excess fluid in the patients lungs and abdomen.  They are going start the Lasix 20 mg daily previously recommended by Dr. Fletcher Anon. Park Meo that the patient will need to have a bmet in 1 week. I will call the patients [place] Merry Proud and give a verbal order for him to draw labs when he is out to the patient home next week.  Vinie Sill w/[facility]. Verbal order to draw a bmet next week left on [employer]. Asked he called back to confirm the message was received.

## 2020-07-10 NOTE — Telephone Encounter (Signed)
Spoke with the patients daughter [name]. Park Meo that Dr. Fletcher Anon prescribed Lasix 20 mg to be taken daily and the patient should continue. [name] verbalized understanding. [name] sts that the patient has had some improvement in her sob since starting the medication. The patient was to have a bmet in 1 week after starting the medication.  [name] sts that she will [facility] the [facility] Mon [facility]13/20 to get her labs drawn. When [facility] drew labs the patients insurance did not cover the [facility] charge but will cover labs drawn at [facility].

## 2020-07-10 NOTE — Telephone Encounter (Signed)
Contacted the patients [place] with [facility].  Merry Proud sts that he did not get the voicemail with the verbal order to draw a bmet for the patient this week. He is scheduled to go out to the patients home on  Wed 07/15/20. I asked if it was possible for someone to go out sooner to draw the lab. Merry Proud sts that he will try to get out there between this afternoon and Monday.  Delfin Edis for his assistance.

## 2020-07-10 NOTE — Telephone Encounter (Signed)
Patient daughter wants to change labs back to [facility] as weather on Monday will be bad .

## 2020-07-10 NOTE — Telephone Encounter (Signed)
Patient daughter [name] calling to update States that patient has taken lasix for 7 days and has now stopped  Patient is doing a little better but would like to know what the next step would be Should she continue giving medication or wait til after blood work is received Please call to discuss

## 2020-07-13 NOTE — Telephone Encounter (Addendum)
Noted. Late entry. I did not contact [place] to make the change Merry Proud will draw the needed bmp as planned.

## 2020-07-16 ENCOUNTER — Telehealth: Payer: Self-pay | Admitting: Cardiovascular Disease

## 2020-07-16 NOTE — Telephone Encounter (Signed)
Merry Proud with [facility] is calling to clarify orders for patient for lab work [phone] is the call back number

## 2020-07-16 NOTE — Telephone Encounter (Signed)
Spoke with Merry Proud, RN with [facility].  Merry Proud sts that he will be out to the patient home and will draw the requested BMP today.

## 2020-07-22 NOTE — Telephone Encounter (Addendum)
Results from the patients bmp that was to be drawn last week by [facility] has not been received.  Contacted the patients [place] @ [facility]. Merry Proud sts that he did draw the bmp and we should have received the results by now. He will f/u with his office to make sure they are faxed to our office asap. Verified our offices fax # with him.

## 2020-08-28 ENCOUNTER — Telehealth: Payer: Self-pay | Admitting: Cardiovascular Disease

## 2020-08-28 MED ORDER — FUROSEMIDE 20 MG PO TABS: 20.0000 mg | ORAL_TABLET | Freq: Every day | ORAL | Status: DC | PRN

## 2020-08-28 NOTE — Telephone Encounter (Signed)
Patient daughter calling  Needs to clarify if patient will continue on diuretic Please call to discuss

## 2020-08-28 NOTE — Telephone Encounter (Signed)
Spoke with the patients daughter [name].  [name] sts that the patient is taking Lasix 20 mg daily as prescribed. [name] wants to know if it should be continued daily or changed to prn. [name] sts that the patient is not eating or drinking much.  Her sob and swelling has improved.  [name] is concerned that the patient may become dehydrated. Pt is not scheduled to see Dr. Fletcher Anon until Jan 2022.  Park Meo that I will fwd the update to Dr. Fletcher Anon and call back with his recommendation.

## 2020-08-28 NOTE — Telephone Encounter (Signed)
Change Lasix to 20 mg daily as needed only.

## 2020-08-28 NOTE — Telephone Encounter (Signed)
DPR on file. lmom for [name] with Dr. Tyrell Antonio response and recommendation. She is contact the office off any questions.

## 2020-09-09 ENCOUNTER — Telehealth: Payer: Self-pay | Admitting: Cardiovascular Disease

## 2020-09-09 NOTE — Telephone Encounter (Signed)
Patients daughter calling in stating patient is having a "really bad day".  Patient is acting the same way as when she had fluid issues before. Patient has felt nauseous but not eating or drinking. O2 has been 92 through the day. No visible swelling anywhere. Daughter think she may need lasix  Please advise

## 2020-09-09 NOTE — Telephone Encounter (Signed)
Spoke with the patients daughter [name]. [name] sts that the patient has not been eating and drniking much. [name] sts that the patient has been acting like she was previously when she was holding on to excess fluid. The pt was given Lasix 20 mg today. [name] sts that the patient has not had any increased urination. Pt O2 sat 92%. [name] sts that the pt is not complaing of sob and does not have any visible signs of swelling.  Park Meo that the patient has not been seen in office since 2019. Patient is scheduled to be seen in Jan 2022, recommended that the patient be seen sooner.  Appt scheduled for 09/10/20 @ 11:20am with Dr. Fletcher Anon. Park Meo that they will need to enter through the medical mall and make their way down to our office. They should allow enough time.  [name] verbalized understanding and voiced appreciation for the assistance.

## 2020-09-10 ENCOUNTER — Telehealth: Payer: Self-pay

## 2020-09-10 ENCOUNTER — Encounter: Payer: Self-pay | Admitting: Cardiovascular Disease

## 2020-09-10 ENCOUNTER — Ambulatory Visit: Payer: Medicare PPO | Admitting: Cardiovascular Disease

## 2020-09-10 ENCOUNTER — Other Ambulatory Visit: Payer: Self-pay

## 2020-09-10 VITALS — BP 120/50 | HR 134 | Ht 63.0 in | Wt 123.0 lb

## 2020-09-10 DIAGNOSIS — I10 Essential (primary) hypertension: Secondary | ICD-10-CM | POA: Diagnosis not present

## 2020-09-10 DIAGNOSIS — I739 Peripheral vascular disease, unspecified: Secondary | ICD-10-CM

## 2020-09-10 DIAGNOSIS — I4819 Other persistent atrial fibrillation: Secondary | ICD-10-CM | POA: Diagnosis not present

## 2020-09-10 DIAGNOSIS — E039 Hypothyroidism, unspecified: Secondary | ICD-10-CM | POA: Diagnosis not present

## 2020-09-10 DIAGNOSIS — I5022 Chronic systolic (congestive) heart failure: Secondary | ICD-10-CM

## 2020-09-10 DIAGNOSIS — E785 Hyperlipidemia, unspecified: Secondary | ICD-10-CM

## 2020-09-10 MED ORDER — DILTIAZEM HCL ER COATED BEADS 180 MG PO CP24
180.0000 mg | ORAL_CAPSULE | Freq: Every day | ORAL | 0 refills | Status: DC
Start: 2020-09-10 — End: 2020-09-10

## 2020-09-10 MED ORDER — DILTIAZEM HCL ER COATED BEADS 180 MG PO CP24
180.0000 mg | ORAL_CAPSULE | Freq: Every day | ORAL | 3 refills | Status: DC
Start: 2020-09-10 — End: 2020-09-22

## 2020-09-10 MED ORDER — APIXABAN 2.5 MG PO TABS: 2.5000 mg | ORAL_TABLET | Freq: Two times a day (BID) | ORAL | 1 refills | Status: DC

## 2020-09-10 MED ORDER — METOPROLOL TARTRATE 25 MG PO TABS
25.0000 mg | ORAL_TABLET | Freq: Two times a day (BID) | ORAL | 3 refills | Status: AC
Start: 2020-09-10 — End: 2020-12-09

## 2020-09-10 MED ORDER — APIXABAN 2.5 MG PO TABS: 2.5000 mg | ORAL_TABLET | Freq: Two times a day (BID) | ORAL | 3 refills | Status: DC

## 2020-09-10 NOTE — Patient Instructions (Addendum)
Medication Instructions:  Medication changes Eliquis was reduce to 2.5mg  by mouth 2 times a day Diltiazem was reduce to 180mg  by mouth once a day Metoprolol 25mg  by mouth 2 times a day was added to your daily medications  *If you need a refill on your cardiac medications before your next appointment, please call your pharmacy*   Lab Work: CBC and BMET  If you have labs (blood work) drawn today and your tests are completely normal, you will receive your results only by: Marland Kitchen MyChart Message (if you have MyChart) OR . A paper copy in the mail If you have any lab test that is abnormal or we need to change your treatment, we will call you to review the results.   Testing/Procedures: None   Follow-Up: At [facility], you and your health needs are our priority.  As part of our continuing mission to provide you with exceptional heart care, we have created designated Provider Care Teams.  These Care Teams include your primary Cardiologist (physician) and Advanced Practice Providers (APPs -  Physician Assistants and Nurse Practitioners) who all work together to provide you with the care you need, when you need it.  We recommend signing up for the patient portal called "MyChart".  Sign up information is provided on this After Visit Summary.  MyChart is used to connect with patients for Virtual Visits (Telemedicine).  Patients are able to view lab/test results, encounter notes, upcoming appointments, etc.  Non-urgent messages can be sent to your provider as well.   To learn more about what you can do with MyChart, go to [email or web address].    Your next appointment:   2 week(s)  The format for your next appointment:   In Person  Provider:   You may see [name], MD or one of the following Advanced Practice Providers on your designated Care Team:    Murray Hodgkins, NP  Christell Faith, PA-C  Marrianne Mood, PA-C  Cadence Kathlen Mody, [place]  Laurann Montana, NP    Other  Instructions   Blood Pressure Record Sheet To take your blood pressure, you will need a blood pressure machine. You can buy a blood pressure machine (blood pressure monitor) at your clinic, drug store, or online. When choosing one, consider:  An automatic monitor that has an arm cuff.  A cuff that wraps snugly around your upper arm. You should be able to fit only one finger between your arm and the cuff.  A device that stores blood pressure reading results.  Do not choose a monitor that measures your blood pressure from your wrist or finger. Follow your health care provider's instructions for how to take your blood pressure. To use this form:  Get one reading in the morning (a.m.) before you take any medicines.  Get one reading in the evening (p.m.) before supper.  Take at least 2 readings with each blood pressure check. This makes sure the results are correct. Wait 1-2 minutes between measurements.  Write down the results in the spaces on this form.  Repeat this once a week, or as told by your health care provider.  Make a follow-up appointment with your health care provider to discuss the results.      Blood pressure log  Date: _______________________  a.m. _____________________(1st reading) _____________________(2nd reading)  p.m. _____________________(1st reading) _____________________(2nd reading) Date: _______________________  a.m. _____________________(1st reading) _____________________(2nd reading)  p.m. _____________________(1st reading) _____________________(2nd reading) Date: _______________________  a.m. _____________________(1st reading) _____________________(2nd reading)  p.m. _____________________(1st reading) _____________________(2nd reading) Date: _______________________  a.m. _____________________(1st reading) _____________________(2nd reading)  p.m. _____________________(1st reading) _____________________(2nd reading) Date:  _______________________  a.m. _____________________(1st reading) _____________________(2nd reading)  p.m. _____________________(1st reading) _____________________(2nd reading)  How to Take Your Blood Pressure You can take your blood pressure at home with a machine. You may need to check your blood pressure at home:  To check if you have high blood pressure (hypertension).  To check your blood pressure over time.  To make sure your blood pressure medicine is working. Supplies needed: You will need a blood pressure machine, or monitor. You can buy one at a drugstore or online. When choosing one:  Choose one with an arm cuff.  Choose one that wraps around your upper arm. Only one finger should fit between your arm and the cuff.  Do not choose one that measures your blood pressure from your wrist or finger. Your doctor can suggest a monitor. How to prepare Avoid these things for 30 minutes before checking your blood pressure:  Drinking caffeine.  Drinking alcohol.  Eating.  Smoking.  Exercising. Five minutes before checking your blood pressure:  Pee.  Sit in a dining chair. Avoid sitting in a soft couch or armchair.  Be quiet. Do not talk. How to take your blood pressure Follow the instructions that came with your machine. If you have a digital blood pressure monitor, these may be the instructions: 1. Sit up straight. 2. Place your feet on the floor. Do not cross your ankles or legs. 3. Rest your left arm at the level of your heart. You may rest it on a table, desk, or chair. 4. Pull up your shirt sleeve. 5. Wrap the blood pressure cuff around the upper part of your left arm. The cuff should be 1 inch (2.5 cm) above your elbow. It is best to wrap the cuff around bare skin. 6. Fit the cuff snugly around your arm. You should be able to place only one finger between the cuff and your arm. 7. Put the cord inside the groove of your elbow. 8. Press the power button. 9. Sit  quietly while the cuff fills with air and loses air. 10. Write down the numbers on the screen. 11. Wait 2-3 minutes and then repeat steps 1-10. What do the numbers mean? Two numbers make up your blood pressure. The first number is called systolic pressure. The second is called diastolic pressure. An example of a blood pressure reading is "120 over 80" (or 120/80). If you are an adult and do not have a medical condition, use this guide to find out if your blood pressure is normal: Normal  First number: below 120.  Second number: below 80. Elevated  First number: 120-129.  Second number: below 80. Hypertension stage 1  First number: 130-139.  Second number: 80-89. Hypertension stage 2  First number: 140 or above.  Second number: 96 or above. Your blood pressure is above normal even if only the top or bottom number is above normal. Follow these instructions at home:  Check your blood pressure as often as your doctor tells you to.  Take your monitor to your next doctor's appointment. Your doctor will: ? Make sure you are using it correctly. ? Make sure it is working right.  Make sure you understand what your blood pressure numbers should be.  Tell your doctor if your medicines are causing side effects. Contact a doctor if:  Your blood pressure keeps being high. Get help right away if:  Your first blood pressure number is  higher than 180.  Your second blood pressure number is higher than 120. This information is not intended to replace advice given to you by your health care provider. Make sure you discuss any questions you have with your health care provider. Document Revised: 09/08/2017 Document Reviewed: 03/04/2016 Elsevier Patient Education  2020 [employer].

## 2020-09-10 NOTE — Telephone Encounter (Signed)
Spoke with pt's daughter [name] regarding pt's changed in medication, on pt's AVS her diltiazem 180mg  stated to take twice a day, this medication is for daily as requested by Dr. Fletcher Anon. Her medication prescritpion sent to pharmacy was correct with Dr. Tyrell Antonio order. Daughter verbalized understanding and is okay with plan of care for pt. Any further concerns or questions, [name] will reach out.

## 2020-09-10 NOTE — Progress Notes (Signed)
Cardiology Office Note   Date:  09/10/2020   ID:  [name], DOB 02-Mar-1919, MRN [id number]  PCP:  [name] Market, MD  Cardiologist:   [name] Sacramento, MD   Chief Complaint  Patient presents with  . office visit    Patient's daughter reports swelling and SOB which has improved since yesterday. Would like to discuss whether or not to continue lasix PRN; Meds verbally reviewed with patient.      History of Present Illness: [name] is a 86 y.o. female who Is here today for a follow-up visit regarding peripheral arterial disease, chronic systolic heart failure and paroxysmal atrial fibrillation. The patient has no history of diabetes or tobacco use. She is known to have hypertension, hyperlipidemia and hypothyroidism.   She was seen in 2017 for nonhealing wounds on the left foot.  Noninvasive vascular evaluation showed an ABI of 0.55 on the right and 0.40 on the left. Duplex showed relatively long occlusion of the mid to distal left SFA with two-vessel runoff below the knee. Angiography at that time showed no significant aortoiliac disease. There was long occlusion of the left SFA with 1 vessel runoff below the knee via the peroneal artery which  gave collaterals to the dorsalis pedis. I performed successful angioplasty of the left SFA followed by drug-coated balloon angioplasty and spot stenting with self-expanding stent in the midsegment.  She was seen in 2018 for  nonhealing ulceration on the right foot with severe rest pain.  She has a  She underwent a vascular studies which showed severely reduced ABI on the right 0.4 with evidence of high-grade stenosis in the right distal SFA or occlusion. I proceeded with angiography in February 2019 which showed moderate calcified disease involving the proximal and mid SFA followed by short occlusion distally with moderate disease in the popliteal artery and one-vessel runoff below the knee via the peroneal artery with severe  disease involving the TP trunk and proximal peroneal artery.  I performed successful orbital atherectomy and drug-coated balloon angioplasty to the right SFA, TP trunk and proximal peroneal artery. Postprocedure ABI improved to 0.79 on the right.  However, there was persistent to severely elevated velocity in the mid SFA at 546. She did have a generalized rash one day after the procedure which was thought to be due to delayed contrast reaction.   She was hospitalized in July after a fall which resulted in right hip fracture. She underwent surgical repair. She was placed on Eliquis for DVT prophylaxis is also given history of A. Fib.  She had an echocardiogram in July of this year which showed an EF of 40 to 45% with global hypokinesis.  Estimated pulmonary systolic pressure was normal  She had recent worsening of bilateral leg edema which improved with furosemide 20 mg daily.  This has been used as needed.  Overall, she has poor appetite and lost more than 20 pounds over the last 4 months.  She had some confusion yesterday but according to the daughter, she seems better today.  She was checked for urinary tract infection and there was no evidence of that.   Past Medical History:  Diagnosis Date  . Arthritis    "legs" (11/22/2017)  . Breast cancer, right breast ([place])    mastectomy 30 yr ago  . Cancer [facility])    Breast Cancer  . DVT (deep venous thrombosis) ([place])    "found an old clot in her ?left leg when they were doing vascular studies; put her  on ASA" (05/18/2016)  . Family history of adverse reaction to anesthesia    "all the family get PONV" (05/18/2016)  . High cholesterol   . Hypertension   . Hypothyroidism   . kidney calculi   . Macular degeneration   . PAD (peripheral artery disease) ([place])   . Pneumonia 1990s  . PONV (postoperative nausea and vomiting)   . Shortness of breath     Past Surgical History:  Procedure Laterality Date  . ABDOMINAL AORTOGRAM W/LOWER EXTREMITY N/A  11/22/2017   Procedure: ABDOMINAL AORTOGRAM W/LOWER EXTREMITY;  Surgeon: Wellington Hampshire, MD;  Location: [facility];  Service: Cardiovascular;  Laterality: N/A;  . BALLOON ANGIOPLASTY, ARTERY Left 05/18/2016   SFA/notes 05/18/2016  . BREAST BIOPSY Right   . CATARACT EXTRACTION W/ INTRAOCULAR LENS  IMPLANT, BILATERAL Bilateral 2014  . CYSTOSCOPY W/ LITHOLAPAXY / [facility]    . CYSTOSCOPY W/ URETERAL STENT PLACEMENT  01/17/2012   Procedure: CYSTOSCOPY WITH RETROGRADE PYELOGRAM/URETERAL STENT PLACEMENT;  Surgeon: Franchot Gallo, MD;  Location: [facility];  Service: Urology;  Laterality: Left;  . CYSTOSCOPY W/ URETERAL STENT PLACEMENT  11/10/2010   Archie Endo 11/10/2010  . CYSTOSCOPY W/ URETERAL STENT REMOVAL  11/22/2010   Archie Endo 11/22/2010  . FEMORAL ENDARTERECTOMY    . FRACTURE SURGERY    . INTRAMEDULLARY (IM) NAIL INTERTROCHANTERIC Right 04/10/2020   Procedure: INTRAMEDULLARY (IM) NAIL INTERTROCHANTRIC;  Surgeon: Nicholes Stairs, MD;  Location: [facility];  Service: Orthopedics;  Laterality: Right;  . MASTECTOMY Right 1989  . MASTECTOMY COMPLETE / SIMPLE Right   . PERIPHERAL ARTERIAL STENT GRAFT Left 2017  . PERIPHERAL VASCULAR ATHERECTOMY Right 11/22/2017   SFA/notes 11/22/2017  . PERIPHERAL VASCULAR ATHERECTOMY Right 11/22/2017   Procedure: PERIPHERAL VASCULAR ATHERECTOMY;  Surgeon: Wellington Hampshire, MD;  Location: [facility];  Service: Cardiovascular;  Laterality: Right;  . PERIPHERAL VASCULAR CATHETERIZATION N/A 05/18/2016   Procedure: Abdominal Aortogram w/Lower Extremity;  Surgeon: Wellington Hampshire, MD;  Location: [place] [facility];  Service: Cardiovascular;  Laterality: N/A;  . WRIST FRACTURE SURGERY Left      Current Outpatient Medications  Medication Sig Dispense Refill  . acetaminophen (TYLENOL) 500 MG tablet Take 1,000 mg by mouth every 6 (six) hours as needed for moderate pain or headache.    Marland Kitchen apixaban (ELIQUIS) 5 MG TABS tablet Take 1 tablet (5 mg total) by mouth 2 (two)  times daily. 60 tablet 2  . cetirizine (ZYRTEC) 10 MG tablet Take 10 mg by mouth daily as needed for allergies.     . cetirizine (ZYRTEC) 10 MG tablet Take 10 mg by mouth daily as needed for allergies.    . Cranberry 125 MG TABS Take 1 tablet by mouth daily.    Marland Kitchen diltiazem (CARDIZEM CD) 360 MG 24 hr capsule Take 1 capsule (360 mg total) by mouth daily. 30 capsule 0  . docusate sodium (COLACE) 100 MG capsule Take 100 mg by mouth daily as needed for mild constipation.    . fluconazole (DIFLUCAN) 100 MG tablet as needed.    . furosemide (LASIX) 20 MG tablet Take 1 tablet (20 mg total) by mouth daily as needed.    Marland Kitchen levothyroxine (SYNTHROID) 50 MCG tablet Take 50 mcg by mouth daily before breakfast.     . loperamide (IMODIUM) 2 MG capsule Take 1 capsule (2 mg total) by mouth every 6 (six) hours as needed for diarrhea or loose stools. 10 capsule 0  . losartan (COZAAR) 50 MG tablet Take 50  mg by mouth at bedtime.     . Multiple Vitamins-Minerals (PRESERVISION AREDS) TABS Take 1 tablet by mouth daily.    Vladimir Faster Glycol-Propyl Glycol (SYSTANE OP) Place 1 drop into both eyes daily as needed (for dry eyes).    . vitamin B-12 (CYANOCOBALAMIN) 500 MCG tablet Take 500 mcg by mouth every other day.     No current facility-administered medications for this visit.    Allergies:   Codeine, Codeine, and Contrast media [iodinated diagnostic agents]    Social History:  The patient  reports that she has never smoked. She has never used smokeless tobacco. She reports that she does not drink alcohol and does not use drugs.   Family History:  The patient's Family history is remarkable for coronary artery disease.   ROS:  Please see the history of present illness.   Otherwise, review of systems are positive for none.   All other systems are reviewed and negative.    PHYSICAL EXAM: VS:  BP (!) 120/50 (BP Location: Right Arm, Patient Position: Sitting, Cuff Size: Normal)   Pulse (!) 134   Ht 5\' 3"  (1.6 m)    Wt 123 lb (55.8 kg)   SpO2 98%   BMI 21.79 kg/m  , BMI Body mass index is 21.79 kg/m. GEN: Well nourished, well developed, in no acute distress  HEENT: normal  Neck: no JVD, carotid bruits, or masses Cardiac: Irregularly irregular and tachycardic; no murmurs, rubs, or gallops, trace bilateral leg edema Respiratory:  clear to auscultation bilaterally, normal work of breathing GI: soft, nontender, nondistended, + BS MS: no deformity or atrophy  Skin: warm and dry, no rash Neuro:  Strength and sensation are intact Psych: euthymic mood, full affect No ulcerations on the foot.  EKG:  EKG is  ordered today. EKG showed atrial fibrillation with rapid ventricular response.  Ventricular rate is 134 bpm.  Possible old inferior infarct.  No clear ST elevation.  Recent Labs: 04/09/2020: ALT 14 04/13/2020: B Natriuretic Peptide 624.6; TSH 2.776 04/15/2020: BUN 34; Creatinine, Ser 1.10; Hemoglobin 10.7; Magnesium 2.4; Platelets 361; Potassium 5.0; Sodium 137    Lipid Panel No results found for: CHOL, TRIG, HDL, CHOLHDL, VLDL, LDLCALC, LDLDIRECT    Wt Readings from Last 3 Encounters:  09/10/20 123 lb (55.8 kg)  05/28/20 145 lb (65.8 kg)  04/09/20 148 lb 2.4 oz (67.2 kg)         ASSESSMENT AND PLAN:   1.  Peripheral arterial disease with previous bilateral critical limb ischemia status post endovascular intervention on both sides. She currently has no claudication and all her wounds have healed.  Continue medical therapy.  2.  Persistent atrial fibrillation with rapid ventricular response: She is significantly tachycardic with a heart rate of 134 bpm.  Going to check CBC and basic metabolic profile.  Given decrease in her weight to below 60 kg, I decreased Eliquis to 2.5 mg twice daily. Given that she had issues with leg edema and her EF was mildly reduced, I am going to try to transition her from diltiazem to metoprolol.  I decrease diltiazem extended release to 180 mg once daily and added  metoprolol tartrate 25 mg twice daily.  I asked the daughter to check blood pressure and heart rate daily and update [place] with the readings on Monday.  The plan is to stop diltiazem in the near future and uptitrate metoprolol as needed.  3. Essential hypertension: Blood pressure is reasonably controlled  4. Hyperlipidemia: Continue treatment with atorvastatin.  5.  Chronic systolic heart failure: Recent worsening likely due to A. fib with RVR.  Ejection fraction was 40 to 45%.  Switching diltiazem to metoprolol as outlined above.  Continue losartan.  Continue to use furosemide as needed but will check basic metabolic profile today to ensure no volume depletion given the progressive weight loss.    Disposition:   FU with me in 2 weeks.  Signed,  [name] Sacramento, MD  09/10/2020 11:47 AM    [facility]

## 2020-09-11 ENCOUNTER — Telehealth: Payer: Self-pay | Admitting: Cardiovascular Disease

## 2020-09-11 DIAGNOSIS — E876 Hypokalemia: Secondary | ICD-10-CM

## 2020-09-11 LAB — BASIC METABOLIC PANEL
BUN/Creatinine Ratio: 28 (ref 12–28)
BUN: 38 mg/dL — ABNORMAL HIGH (ref 10–36)
CO2: 23 mmol/L (ref 20–29)
Calcium: 9.2 mg/dL (ref 8.7–10.3)
Chloride: 104 mmol/L (ref 96–106)
Creatinine, Ser: 1.38 mg/dL — ABNORMAL HIGH (ref 0.57–1.00)
GFR calc Af Amer: 36 mL/min/{1.73_m2} — ABNORMAL LOW (ref >59)
GFR calc non Af Amer: 32 mL/min/{1.73_m2} — ABNORMAL LOW (ref >59)
Glucose: 181 mg/dL — ABNORMAL HIGH (ref 65–99)
Potassium: 3 mmol/L — ABNORMAL LOW (ref 3.5–5.2)
Sodium: 142 mmol/L (ref 134–144)

## 2020-09-11 LAB — CBC
Hematocrit: 40.3 % (ref 34.0–46.6)
Hemoglobin: 14 g/dL (ref 11.1–15.9)
MCH: 30.7 pg (ref 26.6–33.0)
MCHC: 34.7 g/dL (ref 31.5–35.7)
MCV: 88 fL (ref 79–97)
Platelets: 400 10*3/uL (ref 150–450)
RBC: 4.56 x10E6/uL (ref 3.77–5.28)
RDW: 14.3 % (ref 11.7–15.4)
WBC: 15 10*3/uL — ABNORMAL HIGH (ref 3.4–10.8)

## 2020-09-11 MED ORDER — POTASSIUM CHLORIDE CRYS ER 20 MEQ PO TBCR
EXTENDED_RELEASE_TABLET | ORAL | 0 refills | Status: DC
Start: 2020-09-11 — End: 2020-09-15

## 2020-09-11 NOTE — Telephone Encounter (Addendum)
Spoke with the patients daughter [name].  Patient was seen yesterday by Dr. Fletcher Anon and was in AFIB with RVR. Diltiazem was reduce to 180mg  daily. Metoprolol increased to  25mg  bid.  Patient BP at 11am after breafast 91/51 73 bpm. Patient not complaining of any symptoms. She has not been eating or drinking a lot.  I asked that they recheck her BP this afternoon and call back to provide an update.

## 2020-09-11 NOTE — Telephone Encounter (Signed)
Spoke with the patients daughter.  Patient BP is a bit better.  Advised [name] to continue to monitor the patients BP and to contact the office for readings below 90/50. The plan to address the patient potassium is the same.  Park Meo that I will be forwarding the update to Dr. Fletcher Anon.

## 2020-09-11 NOTE — Telephone Encounter (Signed)
On prelim review of the patients 09/10/20 labs. A critical potassium of 3.0 was noted. Discussed with Ignacia Bayley, NP.  Chris's recommendation:  Start Potassium 80 meq today (40 meq now and another 40 meq in a few hours) Then take potassium 40 meq daily. Repeat bmet on Mon 09/14/20.  Patient daughter aware of Chris's recommendation with verbalized understanding. Adv her to hold Lasix and to further instructed.

## 2020-09-11 NOTE — Telephone Encounter (Signed)
Please call reagarding patient's BP medication. STates her BP is 91/57. Please call to discuss.

## 2020-09-11 NOTE — Telephone Encounter (Signed)
Patient's daughter is calling back with BP readings: 101/53 HR 89.

## 2020-09-14 ENCOUNTER — Telehealth: Payer: Self-pay | Admitting: Cardiovascular Disease

## 2020-09-14 ENCOUNTER — Other Ambulatory Visit: Payer: Self-pay

## 2020-09-14 ENCOUNTER — Other Ambulatory Visit
Admission: RE | Admit: 2020-09-14 | Discharge: 2020-09-14 | Disposition: A | Discharge status: Home / Self Care | Payer: Medicare PPO | Source: Ambulatory Visit | Attending: Cardiovascular Disease | Admitting: Cardiovascular Disease

## 2020-09-14 DIAGNOSIS — E876 Hypokalemia: Secondary | ICD-10-CM | POA: Diagnosis present

## 2020-09-14 LAB — BASIC METABOLIC PANEL
Anion gap: 11 (ref 5–15)
BUN: 35 mg/dL — ABNORMAL HIGH (ref 8–23)
CO2: 23 mmol/L (ref 22–32)
Calcium: 9.5 mg/dL (ref 8.9–10.3)
Chloride: 108 mmol/L (ref 98–111)
Creatinine, Ser: 1.12 mg/dL — ABNORMAL HIGH (ref 0.44–1.00)
GFR, Estimated: 44 mL/min — ABNORMAL LOW (ref >60)
Glucose, Bld: 209 mg/dL — ABNORMAL HIGH (ref 70–99)
Potassium: 5.6 mmol/L — ABNORMAL HIGH (ref 3.5–5.1)
Sodium: 142 mmol/L (ref 135–145)

## 2020-09-14 NOTE — Telephone Encounter (Signed)
Secure chat message received from Dr. Fletcher Anon with instructions to have the patient stop potassium. No further instructions received at this time as MD was dealing with a STEMI coming in.  I have called and spoken with the patient's daughter, [name] about her lab results from today.  I have advised her of Dr. Tyrell Antonio recommendations to stop potassium for now based on the patient's results.  I made [name] aware I do not have any further MD recommendations at this time on a follow up lab/ additional med changes as Dr. Fletcher Anon is currently dealing with an emergency at the hospital. [name] confirmed the patient is also not taking lasix at this time.   She is aware I will send this message to Dr. Olga Coaster for additional recommendations/ follow up and she may be contacted later today or tomorrow with these.   [name] voices understanding and is agreeable.  [name] did report recent BP (HR) readings. 12/3- 91/47 (73)- AM 12/3- later HR reading (97)  12/4- 93/40- AM 12/4- 97/64 (88) - PM   12/5- 116/75 (86)- AM 12/5- 103/69 (70)- PM

## 2020-09-14 NOTE — Telephone Encounter (Signed)
Not until she comes back for follow-up.

## 2020-09-14 NOTE — Telephone Encounter (Signed)
Her heart rate improved but blood pressure is low.  Recommend stopping verapamil.  Continue metoprolol and losartan.  Update [place] about heart rate and blood pressure readings in 2-3 days.

## 2020-09-14 NOTE — Telephone Encounter (Signed)
I have reviewed the patient's chart. She did have a repeat BMP today. Her potassium level is now high at 5.6.   To Dr. Fletcher Anon to review.

## 2020-09-14 NOTE — Telephone Encounter (Signed)
Make sure she is not taking Lasix anymore as the labs indicated mild dehydration.  Once she gets the labs done today, will review and decide if she needs to continue taking potassium.

## 2020-09-14 NOTE — Telephone Encounter (Signed)
[facility] calling to see if Dr. Fletcher Anon will be her hospice attending  Please advise

## 2020-09-15 NOTE — Telephone Encounter (Signed)
Spoke with the patients daughter [name] and made her aware of Dr. Tyrell Antonio recommendation. On review of the pt medications. She is taking diltiazem not verapamil. Park Meo that I will clarify with Dr. Fletcher Anon but I am pretty sure his intention is to stop diltiazem.  Pt is not take Lasix or potassium. Plan to repeat labs at the pt 09/24/20 appt. [name] verbalized understanding.

## 2020-09-15 NOTE — Telephone Encounter (Signed)
Yes I meant Diltiazem

## 2020-09-15 NOTE — Telephone Encounter (Signed)
Spoke with [name] and made her aware of Dr. Tyrell Antonio response. [name] verbalized understanding and will contact the office on Fri 09/18/20 to provide an update on the pt BP and HR.

## 2020-09-15 NOTE — Telephone Encounter (Signed)
[facility] calling to check on status  States they just need a verbal to continue on with care  Please call [phone]

## 2020-09-15 NOTE — Telephone Encounter (Addendum)
Spoke with [facility] and adv them of Dr. Tyrell Antonio response.  Pt family is attempting to schedule an appt with a pcp, who will hopefully take over as attending for the patients hospice care.  Spoke with the patients daughter [name] her with the telephone number for [facility] to help the pt establish with a new pcp.

## 2020-09-15 NOTE — Telephone Encounter (Signed)
Yes I can be the hospice attending for now

## 2020-09-15 NOTE — Telephone Encounter (Signed)
Spoke with the patient daughter [name]. [name] sts that the patients pcp has retired and the pt has not been seen by their office on while. [name] ask for our assistance in obtaining Hospice care services for the patient.  Msg fwd to Dr. Fletcher Anon for approval.

## 2020-09-18 ENCOUNTER — Telehealth: Payer: Self-pay | Admitting: Cardiovascular Disease

## 2020-09-18 NOTE — Telephone Encounter (Signed)
Readings fwd to Dr. Fletcher Anon to review and advise.

## 2020-09-18 NOTE — Telephone Encounter (Signed)
Patient daughter callng in with 3 blood pressues after taking medications 12/8 106/64 am  116/78 Pm 12/9 149/66 am 111/66 pm 12/10 120/83 am 94/54 manual by nurse am 115/81 pm

## 2020-09-21 ENCOUNTER — Telehealth: Payer: Self-pay | Admitting: Cardiovascular Disease

## 2020-09-21 NOTE — Telephone Encounter (Signed)
Pt c/o medication issue:  1. Name of Medication: Eliquis   2. How are you currently taking this medication (dosage and times per day)? 2.5 MG 1 tablet 2 times daily   3. Are you having a reaction (difficulty breathing--STAT)? Bleeding   4. What is your medication issue? Patient has been bleeding - states that they stopped Eliquis yesterday and would like to know what to do moving forward.  Please call to discuss at [phone]

## 2020-09-21 NOTE — Telephone Encounter (Signed)
Spoke with [facility] nurse and she states that patient had heavy bleeding yesterday and stopped her Eliquis. They were not able to determine if it was vaginal or rectal bleeding in nature. Since stopping medication yesterday it has not been as much. Reviewed that we typically don't stop this medication and send to primary care or specialty provider to determine source. Advised that I would send message over to provider for further review and recommendations. She did want to see which medications we were prescribing and reviewed those with her. She also states that her blood pressures have been on the lower side and on the 10th BP was 94/54. Will send over to provider for review and recommendations.

## 2020-09-22 NOTE — Telephone Encounter (Signed)
Spoke with Ivin Booty at [employer]. She verbalized understanding to stop the ELiquis, no need for aspirin and it will be readdressed at follow up in January.   Ivin Booty confirmed that patient is off the diltiazem but is still taking the metoprolol and losartan.  Ivin Booty would like to know if patient can stop the vitamins including Cranberry, vitamin B-12, and preservision AREDs.  Routing to Dr Fletcher Anon to confirm if that is ok.

## 2020-09-22 NOTE — Telephone Encounter (Signed)
[place] calling to  checking status of call back.    Wanting to consider stopping eliquis and all multivitamins and consider using a low dose asa.    Please call asap with advise and to go over complete med list.

## 2020-09-22 NOTE — Telephone Encounter (Signed)
These readings look fine.

## 2020-09-22 NOTE — Telephone Encounter (Signed)
Yes it is okay to stop the vitamins.

## 2020-09-22 NOTE — Telephone Encounter (Signed)
Hold Eliquis for now.  No need for aspirin.  We can address resuming Eliquis upon her follow-up with [place].

## 2020-09-23 ENCOUNTER — Telehealth: Payer: Self-pay | Admitting: Cardiovascular Disease

## 2020-09-23 MED ORDER — FUROSEMIDE 20 MG PO TABS: 20.0000 mg | ORAL_TABLET | Freq: Every day | ORAL | 1 refills | Status: AC | PRN

## 2020-09-23 NOTE — Telephone Encounter (Signed)
Spoke with patients daughter per release form. She wanted to inquire about which meds are needed to help her given her low blood pressures. She also wanted to see if she could take lasix or fluid pill to help with her shortness of breath with exertion and cough. Reviewed that fluid pill could reduce her blood pressures as well. She then wanted to know if she should continue the metoprolol tartrate as well. She wants to continue medications which are necessary but they also want to make sure she is comfortable. Advised that I would send to provider for review and recommendations. She requested that we call her back.

## 2020-09-23 NOTE — Telephone Encounter (Signed)
Spoke with Ivin Booty and let her know it is ok to stop the vitamins.

## 2020-09-23 NOTE — Telephone Encounter (Signed)
Spoke with patients daughter per release form and reviewed provider recommendations. She verbalized understanding and was appreciative for the call back.

## 2020-09-23 NOTE — Telephone Encounter (Signed)
Patient daughter calling to ask about using lasix prn as she has intermittent cough sob on exertion   Please call to discuss this and other medications .  Daughter would like to go over meds .

## 2020-09-23 NOTE — Telephone Encounter (Signed)
Continue metoprolol 25 mg twice daily.  Continue to use furosemide as needed for weight gain, shortness of breath and leg edema.

## 2020-09-23 NOTE — Addendum Note (Signed)
Addended by: Annia Belt on: 09/23/2020 10:14 AM   Modules accepted: Orders

## 2020-09-24 ENCOUNTER — Ambulatory Visit: Payer: Medicare PPO | Admitting: Physician Assistant

## 2020-09-24 NOTE — Telephone Encounter (Signed)
Noted  

## 2020-09-30 ENCOUNTER — Other Ambulatory Visit: Payer: Self-pay | Admitting: Cardiovascular Disease

## 2020-10-01 ENCOUNTER — Telehealth: Payer: Self-pay | Admitting: Cardiovascular Disease

## 2020-10-01 NOTE — Telephone Encounter (Signed)
Patient passed away yesterday and [name] was instructed to call to have [name] sign death certificate .    Please call to advise if this is an option .

## 2020-10-01 NOTE — Telephone Encounter (Signed)
I can sign it today if they bring it

## 2020-10-01 NOTE — Telephone Encounter (Signed)
Spoke with Karena Addison at [name] and [place]. Adv her that Dr. Fletcher Anon would be available to sign the patients death certificate today.  Our office is closed tomorrow and Dr. Fletcher Anon is out of the office all next week. Karena Addison sts that the patients family will be in their office today at [place] from [facility] will bring the death certificate to our office today around 3pm.

## 2020-10-01 NOTE — Telephone Encounter (Signed)
Will fwd to Dr. Arida to advise. 

## 2020-10-10 NOTE — Telephone Encounter (Signed)
Refill request

## 2020-10-10 NOTE — Telephone Encounter (Signed)
Per phone note 09/21/20- Dr. Fletcher Anon recommends holding Eliquis 2/2 bleeding until discussed at next f/u appt.  Will not send in refill at this time.

## 2020-10-10 DEATH — deceased

## 2020-10-30 ENCOUNTER — Encounter (INDEPENDENT_AMBULATORY_CARE_PROVIDER_SITE_OTHER): Payer: Medicare PPO | Admitting: Ophthalmology

## 2020-11-05 ENCOUNTER — Ambulatory Visit: Payer: Medicare PPO | Admitting: Cardiovascular Disease

## 2021-12-11 IMAGING — RF DG HIP (WITH PELVIS) OPERATIVE*R*
1 series · 6 of 6 positions shown · non-contrast
Comparison: 04/09/2020

CLINICAL DATA: Internal fixation

EXAM:
OPERATIVE RIGHT HIP (WITH PELVIS IF PERFORMED) 6 VIEWS
TECHNIQUE: Fluoroscopic spot image(s) were submitted for interpretation
post-operatively.

[Series 1: run · 6 of 6 slices shown]
[im 1/6]
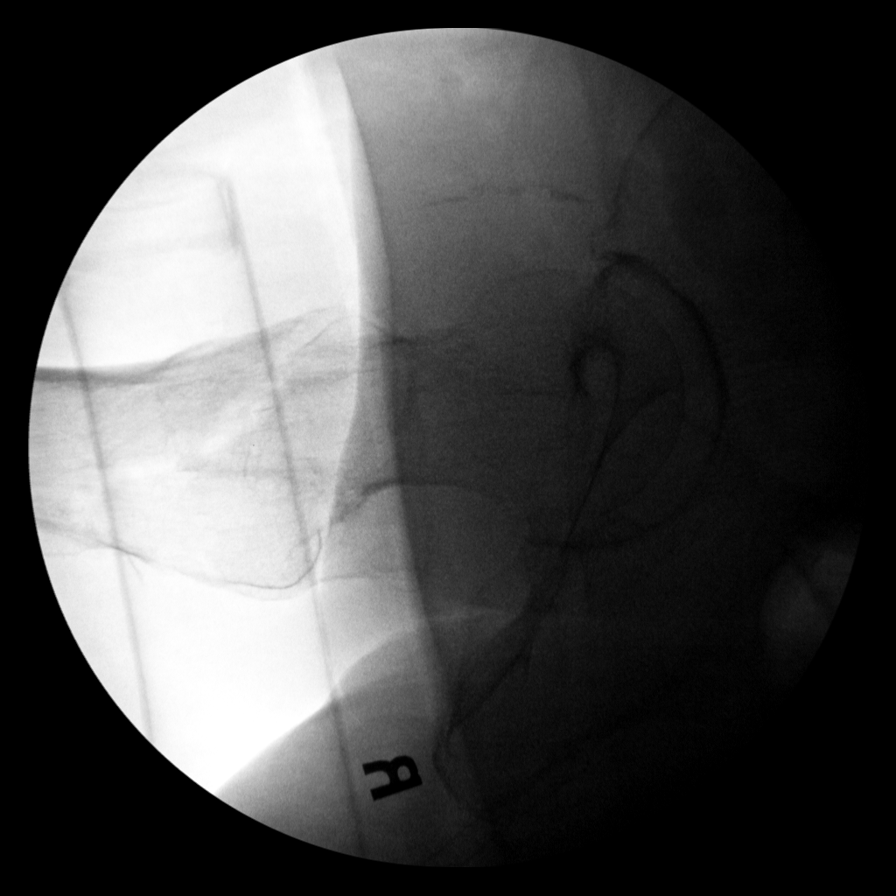
[im 2/6]
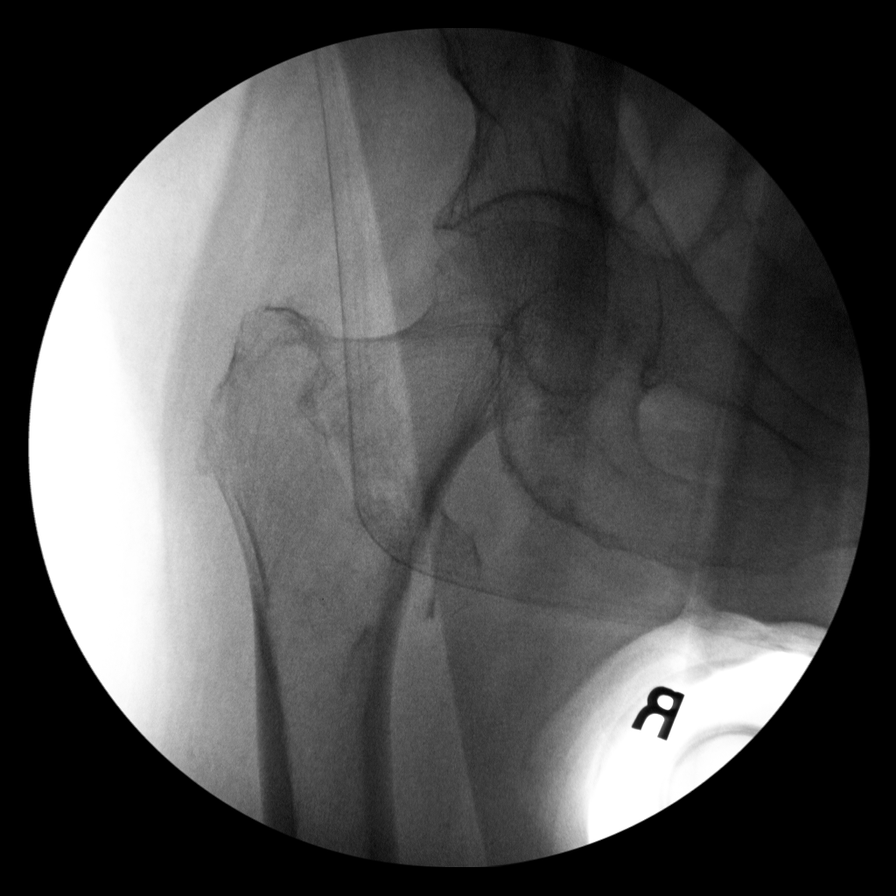
[im 3/6]
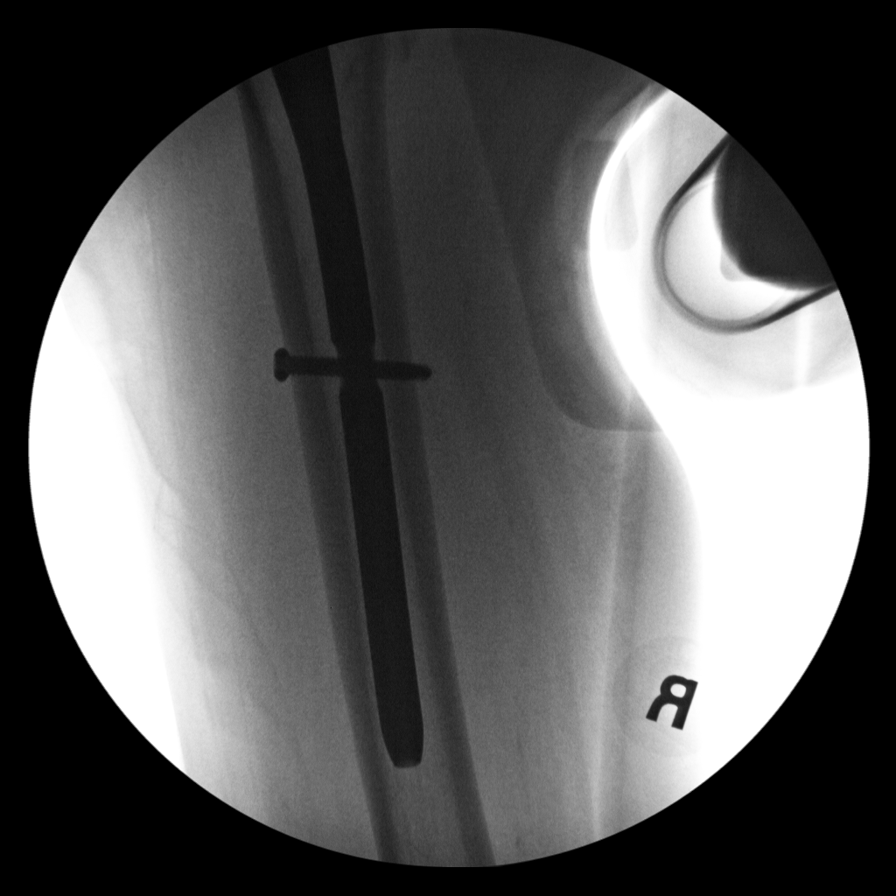
[im 4/6]
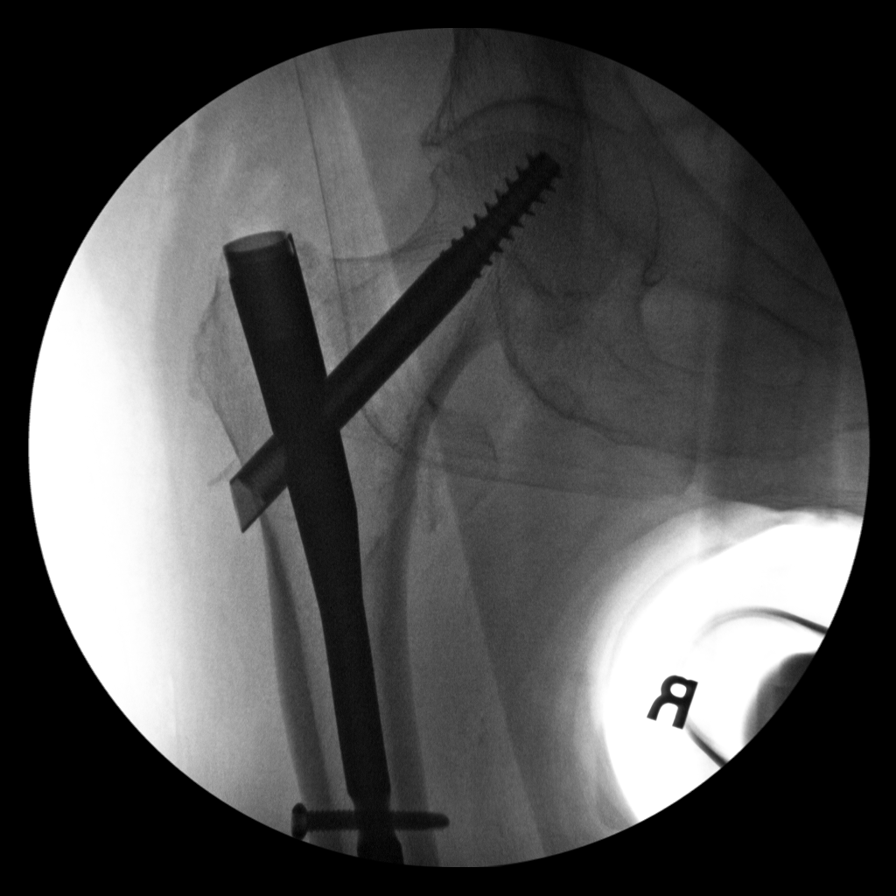
[im 5/6]
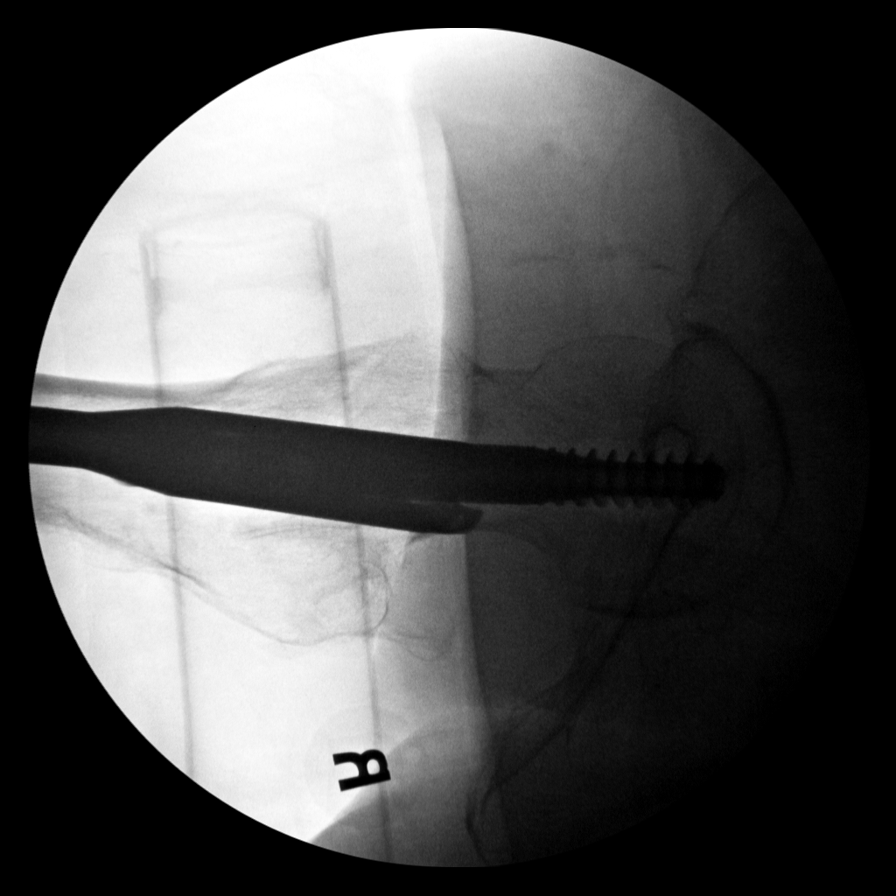
[im 6/6]
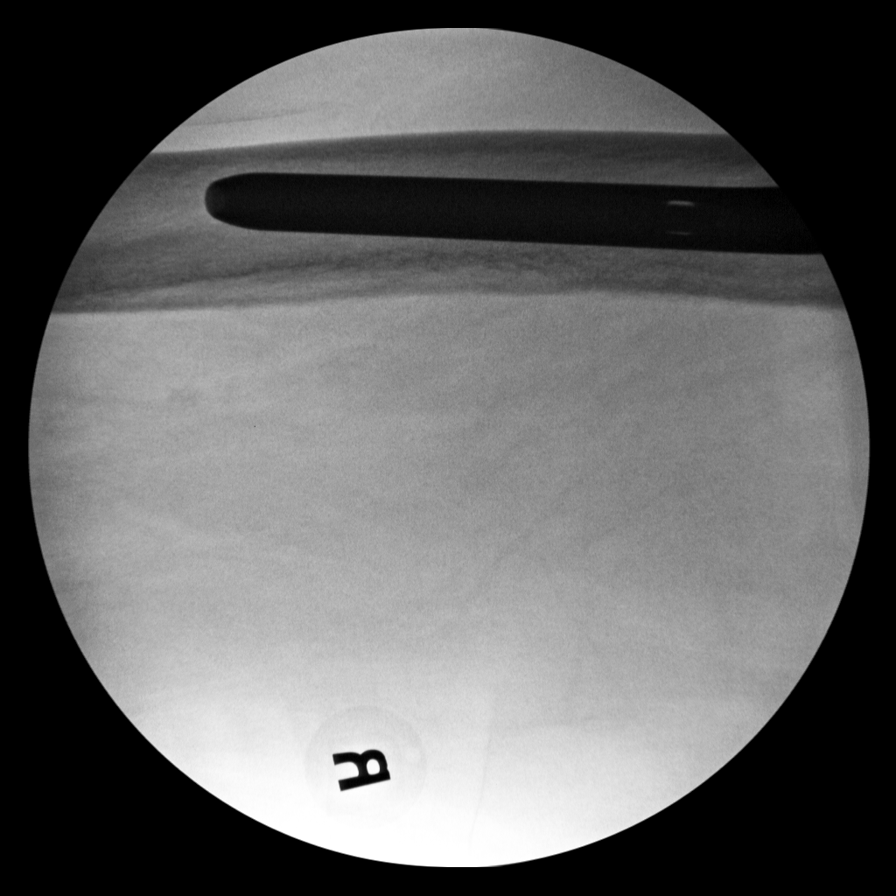

[6 of 6 positions shown; findings below may reference images not displayed]

FINDINGS: Internal fixation across the right femoral intertrochanteric
fracture. Near anatomic alignment. No hardware bony complicating
feature.
IMPRESSION: Internal fixation.  No visible complicating feature.
